# Patient Record
Sex: Male | Born: 1940 | Race: White | Hispanic: No | Marital: Married | Smoking: Never smoker
Health system: Southern US, Community
[De-identification: ages and names within clinical notes are randomized; demographics above are authoritative.]

## PROBLEM LIST (undated history)

## (undated) DIAGNOSIS — E039 Hypothyroidism, unspecified: Secondary | ICD-10-CM

## (undated) DIAGNOSIS — M1990 Unspecified osteoarthritis, unspecified site: Secondary | ICD-10-CM

## (undated) DIAGNOSIS — D126 Benign neoplasm of colon, unspecified: Secondary | ICD-10-CM

## (undated) DIAGNOSIS — J849 Interstitial pulmonary disease, unspecified: Secondary | ICD-10-CM

## (undated) DIAGNOSIS — Z85828 Personal history of other malignant neoplasm of skin: Secondary | ICD-10-CM

## (undated) DIAGNOSIS — F411 Generalized anxiety disorder: Secondary | ICD-10-CM

## (undated) DIAGNOSIS — Z8774 Personal history of (corrected) congenital malformations of heart and circulatory system: Secondary | ICD-10-CM

## (undated) DIAGNOSIS — N138 Other obstructive and reflux uropathy: Secondary | ICD-10-CM

## (undated) DIAGNOSIS — M722 Plantar fascial fibromatosis: Secondary | ICD-10-CM

## (undated) DIAGNOSIS — G4700 Insomnia, unspecified: Secondary | ICD-10-CM

## (undated) DIAGNOSIS — Z5181 Encounter for therapeutic drug level monitoring: Secondary | ICD-10-CM

## (undated) DIAGNOSIS — I780 Hereditary hemorrhagic telangiectasia: Secondary | ICD-10-CM

## (undated) DIAGNOSIS — E7800 Pure hypercholesterolemia, unspecified: Secondary | ICD-10-CM

## (undated) DIAGNOSIS — M545 Low back pain, unspecified: Secondary | ICD-10-CM

## (undated) DIAGNOSIS — K589 Irritable bowel syndrome without diarrhea: Secondary | ICD-10-CM

## (undated) DIAGNOSIS — D044 Carcinoma in situ of skin of scalp and neck: Secondary | ICD-10-CM

## (undated) DIAGNOSIS — K219 Gastro-esophageal reflux disease without esophagitis: Secondary | ICD-10-CM

## (undated) DIAGNOSIS — N401 Enlarged prostate with lower urinary tract symptoms: Secondary | ICD-10-CM

## (undated) DIAGNOSIS — J3489 Other specified disorders of nose and nasal sinuses: Secondary | ICD-10-CM

## (undated) HISTORY — PX: OTHER SURGICAL HISTORY: SHX169

## (undated) HISTORY — DX: Insomnia, unspecified: G47.00

## (undated) HISTORY — PX: NASAL SINUS SURGERY: SHX719

## (undated) HISTORY — DX: Benign prostatic hyperplasia with lower urinary tract symptoms: N40.1

## (undated) HISTORY — DX: Benign neoplasm of colon, unspecified: D12.6

## (undated) HISTORY — DX: Generalized anxiety disorder: F41.1

## (undated) HISTORY — DX: Other specified disorders of nose and nasal sinuses: J34.89

## (undated) HISTORY — DX: Irritable bowel syndrome, unspecified: K58.9

## (undated) HISTORY — DX: Plantar fascial fibromatosis: M72.2

## (undated) HISTORY — DX: Low back pain: M54.5

## (undated) HISTORY — DX: Low back pain, unspecified: M54.50

## (undated) HISTORY — DX: Other obstructive and reflux uropathy: N13.8

## (undated) HISTORY — PX: PROSTATE SURGERY: SHX751

## (undated) HISTORY — DX: Hypothyroidism, unspecified: E03.9

## (undated) HISTORY — DX: Pure hypercholesterolemia, unspecified: E78.00

## (undated) HISTORY — PX: BRAIN SURGERY: SHX531

## (undated) HISTORY — DX: Encounter for therapeutic drug level monitoring: Z51.81

## (undated) HISTORY — PX: TRANSTHORACIC ECHOCARDIOGRAM: SHX275

---

## 1989-12-20 HISTORY — PX: VARICOCELECTOMY: SHX1084

## 1999-05-08 ENCOUNTER — Encounter: Payer: Self-pay | Admitting: Urology

## 1999-05-08 ENCOUNTER — Ambulatory Visit (HOSPITAL_COMMUNITY): Admission: RE | Admit: 1999-05-08 | Discharge: 1999-05-08 | Payer: Self-pay | Admitting: Urology

## 1999-07-21 ENCOUNTER — Emergency Department (HOSPITAL_COMMUNITY): Admission: EM | Admit: 1999-07-21 | Discharge: 1999-07-21 | Payer: Self-pay | Admitting: Emergency Medicine

## 2000-05-05 ENCOUNTER — Ambulatory Visit (HOSPITAL_COMMUNITY): Admission: RE | Admit: 2000-05-05 | Discharge: 2000-05-05 | Payer: Self-pay | Admitting: Pulmonary Disease

## 2000-05-05 ENCOUNTER — Encounter: Payer: Self-pay | Admitting: Pulmonary Disease

## 2001-02-23 ENCOUNTER — Emergency Department (HOSPITAL_COMMUNITY): Admission: EM | Admit: 2001-02-23 | Discharge: 2001-02-23 | Payer: Self-pay | Admitting: Emergency Medicine

## 2001-03-02 ENCOUNTER — Emergency Department (HOSPITAL_COMMUNITY): Admission: EM | Admit: 2001-03-02 | Discharge: 2001-03-02 | Payer: Self-pay | Admitting: Emergency Medicine

## 2001-08-29 ENCOUNTER — Encounter: Payer: Self-pay | Admitting: Pulmonary Disease

## 2001-08-29 ENCOUNTER — Ambulatory Visit (HOSPITAL_COMMUNITY): Admission: RE | Admit: 2001-08-29 | Discharge: 2001-08-29 | Payer: Self-pay | Admitting: Pulmonary Disease

## 2003-01-03 ENCOUNTER — Inpatient Hospital Stay (HOSPITAL_COMMUNITY): Admission: EM | Admit: 2003-01-03 | Discharge: 2003-01-04 | Payer: Self-pay | Admitting: Emergency Medicine

## 2003-01-03 ENCOUNTER — Encounter: Payer: Self-pay | Admitting: Cardiology

## 2003-01-03 ENCOUNTER — Encounter: Payer: Self-pay | Admitting: Emergency Medicine

## 2003-01-04 ENCOUNTER — Encounter: Payer: Self-pay | Admitting: Cardiology

## 2003-01-14 HISTORY — PX: CARDIOVASCULAR STRESS TEST: SHX262

## 2005-06-01 ENCOUNTER — Ambulatory Visit: Payer: Self-pay | Admitting: Pulmonary Disease

## 2005-08-27 ENCOUNTER — Ambulatory Visit: Payer: Self-pay | Admitting: Pulmonary Disease

## 2005-12-06 ENCOUNTER — Ambulatory Visit (HOSPITAL_COMMUNITY): Admission: RE | Admit: 2005-12-06 | Discharge: 2005-12-06 | Payer: Self-pay | Admitting: Pulmonary Disease

## 2005-12-06 ENCOUNTER — Ambulatory Visit: Payer: Self-pay | Admitting: Pulmonary Disease

## 2006-05-27 ENCOUNTER — Ambulatory Visit: Payer: Self-pay | Admitting: Pulmonary Disease

## 2006-10-04 ENCOUNTER — Ambulatory Visit: Payer: Self-pay | Admitting: Pulmonary Disease

## 2007-01-30 ENCOUNTER — Ambulatory Visit: Payer: Self-pay | Admitting: Pulmonary Disease

## 2007-03-20 ENCOUNTER — Ambulatory Visit: Payer: Self-pay | Admitting: Pulmonary Disease

## 2007-06-15 ENCOUNTER — Ambulatory Visit: Payer: Self-pay | Admitting: Pulmonary Disease

## 2007-06-15 LAB — CONVERTED CEMR LAB
ALT: 24 units/L (ref 0–53)
AST: 23 units/L (ref 0–37)
Albumin: 4 g/dL (ref 3.5–5.2)
Alkaline Phosphatase: 66 units/L (ref 39–117)
BUN: 16 mg/dL (ref 6–23)
Basophils Absolute: 0 10*3/uL (ref 0.0–0.1)
Basophils Relative: 0.5 % (ref 0.0–1.0)
Bilirubin Urine: NEGATIVE
Bilirubin, Direct: 0.1 mg/dL (ref 0.0–0.3)
CO2: 33 meq/L — ABNORMAL HIGH (ref 19–32)
Calcium: 9.7 mg/dL (ref 8.4–10.5)
Chloride: 107 meq/L (ref 96–112)
Cholesterol: 208 mg/dL (ref 0–200)
Creatinine, Ser: 1.1 mg/dL (ref 0.4–1.5)
Direct LDL: 139.4 mg/dL
Eosinophils Absolute: 0.1 10*3/uL (ref 0.0–0.6)
Eosinophils Relative: 1.4 % (ref 0.0–5.0)
GFR calc Af Amer: 87 mL/min
GFR calc non Af Amer: 72 mL/min
Glucose, Bld: 96 mg/dL (ref 70–99)
HCT: 49.7 % (ref 39.0–52.0)
HDL: 33.8 mg/dL — ABNORMAL LOW (ref >39.0)
Hemoglobin, Urine: NEGATIVE
Hemoglobin: 16.6 g/dL (ref 13.0–17.0)
Ketones, ur: NEGATIVE mg/dL
Leukocytes, UA: NEGATIVE
Lymphocytes Relative: 42.2 % (ref 12.0–46.0)
MCHC: 33.3 g/dL (ref 30.0–36.0)
MCV: 93 fL (ref 78.0–100.0)
Monocytes Absolute: 0.6 10*3/uL (ref 0.2–0.7)
Monocytes Relative: 9.5 % (ref 3.0–11.0)
Neutro Abs: 3.1 10*3/uL (ref 1.4–7.7)
Neutrophils Relative %: 46.4 % (ref 43.0–77.0)
Nitrite: NEGATIVE
PSA: 1.19 ng/mL (ref 0.10–4.00)
Platelets: 191 10*3/uL (ref 150–400)
Potassium: 5 meq/L (ref 3.5–5.1)
RBC: 5.35 M/uL (ref 4.22–5.81)
RDW: 13.1 % (ref 11.5–14.6)
Sodium: 143 meq/L (ref 135–145)
Specific Gravity, Urine: 1.025 (ref 1.000–1.03)
TSH: 0.88 microintl units/mL (ref 0.35–5.50)
Total Bilirubin: 1.1 mg/dL (ref 0.3–1.2)
Total CHOL/HDL Ratio: 6.2
Total Protein, Urine: NEGATIVE mg/dL
Total Protein: 7 g/dL (ref 6.0–8.3)
Triglycerides: 75 mg/dL (ref 0–149)
Urine Glucose: NEGATIVE mg/dL
Urobilinogen, UA: 0.2 (ref 0.0–1.0)
VLDL: 15 mg/dL (ref 0–40)
WBC: 6.6 10*3/uL (ref 4.5–10.5)
pH: 6 (ref 5.0–8.0)

## 2007-09-28 ENCOUNTER — Ambulatory Visit: Payer: Self-pay | Admitting: Pulmonary Disease

## 2008-02-08 DIAGNOSIS — M545 Low back pain, unspecified: Secondary | ICD-10-CM | POA: Insufficient documentation

## 2008-02-08 DIAGNOSIS — K589 Irritable bowel syndrome without diarrhea: Secondary | ICD-10-CM | POA: Insufficient documentation

## 2008-02-08 DIAGNOSIS — D126 Benign neoplasm of colon, unspecified: Secondary | ICD-10-CM

## 2008-02-08 DIAGNOSIS — E039 Hypothyroidism, unspecified: Secondary | ICD-10-CM | POA: Insufficient documentation

## 2008-02-09 ENCOUNTER — Ambulatory Visit: Payer: Self-pay | Admitting: Pulmonary Disease

## 2008-02-09 DIAGNOSIS — I780 Hereditary hemorrhagic telangiectasia: Secondary | ICD-10-CM | POA: Insufficient documentation

## 2008-02-10 DIAGNOSIS — G4709 Other insomnia: Secondary | ICD-10-CM | POA: Insufficient documentation

## 2008-02-10 DIAGNOSIS — N401 Enlarged prostate with lower urinary tract symptoms: Secondary | ICD-10-CM

## 2008-02-10 DIAGNOSIS — J40 Bronchitis, not specified as acute or chronic: Secondary | ICD-10-CM | POA: Insufficient documentation

## 2008-02-10 DIAGNOSIS — N138 Other obstructive and reflux uropathy: Secondary | ICD-10-CM | POA: Insufficient documentation

## 2008-02-10 DIAGNOSIS — G4700 Insomnia, unspecified: Secondary | ICD-10-CM | POA: Insufficient documentation

## 2008-02-10 DIAGNOSIS — C4490 Unspecified malignant neoplasm of skin, unspecified: Secondary | ICD-10-CM

## 2008-02-10 DIAGNOSIS — K219 Gastro-esophageal reflux disease without esophagitis: Secondary | ICD-10-CM

## 2008-02-10 DIAGNOSIS — J3489 Other specified disorders of nose and nasal sinuses: Secondary | ICD-10-CM

## 2008-02-10 LAB — CONVERTED CEMR LAB
ALT: 21 units/L (ref 0–53)
AST: 18 units/L (ref 0–37)
Albumin: 4.2 g/dL (ref 3.5–5.2)
Alkaline Phosphatase: 60 units/L (ref 39–117)
BUN: 19 mg/dL (ref 6–23)
Basophils Absolute: 0 10*3/uL (ref 0.0–0.1)
Basophils Relative: 0 % (ref 0.0–1.0)
Bilirubin, Direct: 0.2 mg/dL (ref 0.0–0.3)
CO2: 32 meq/L (ref 19–32)
Calcium: 9.3 mg/dL (ref 8.4–10.5)
Chloride: 107 meq/L (ref 96–112)
Cholesterol: 172 mg/dL (ref 0–200)
Creatinine, Ser: 1 mg/dL (ref 0.4–1.5)
Eosinophils Absolute: 0.2 10*3/uL (ref 0.0–0.6)
Eosinophils Relative: 2.2 % (ref 0.0–5.0)
GFR calc Af Amer: 96 mL/min
GFR calc non Af Amer: 80 mL/min
Glucose, Bld: 97 mg/dL (ref 70–99)
HCT: 51.1 % (ref 39.0–52.0)
HDL: 29.7 mg/dL — ABNORMAL LOW (ref >39.0)
Hemoglobin: 17 g/dL (ref 13.0–17.0)
LDL Cholesterol: 129 mg/dL — ABNORMAL HIGH (ref 0–99)
Lymphocytes Relative: 39.3 % (ref 12.0–46.0)
MCHC: 33.4 g/dL (ref 30.0–36.0)
MCV: 92 fL (ref 78.0–100.0)
Monocytes Absolute: 0.8 10*3/uL — ABNORMAL HIGH (ref 0.2–0.7)
Monocytes Relative: 9.3 % (ref 3.0–11.0)
Neutro Abs: 4.3 10*3/uL (ref 1.4–7.7)
Neutrophils Relative %: 49.2 % (ref 43.0–77.0)
PSA: 0.76 ng/mL (ref 0.10–4.00)
Platelets: 214 10*3/uL (ref 150–400)
Potassium: 4.9 meq/L (ref 3.5–5.1)
RBC: 5.55 M/uL (ref 4.22–5.81)
RDW: 12.3 % (ref 11.5–14.6)
Sodium: 142 meq/L (ref 135–145)
TSH: 0.69 microintl units/mL (ref 0.35–5.50)
Total Bilirubin: 1 mg/dL (ref 0.3–1.2)
Total CHOL/HDL Ratio: 5.8
Total Protein: 7.1 g/dL (ref 6.0–8.3)
Triglycerides: 69 mg/dL (ref 0–149)
VLDL: 14 mg/dL (ref 0–40)
WBC: 8.7 10*3/uL (ref 4.5–10.5)

## 2008-02-16 ENCOUNTER — Telehealth (INDEPENDENT_AMBULATORY_CARE_PROVIDER_SITE_OTHER): Payer: Self-pay | Admitting: *Deleted

## 2008-02-23 ENCOUNTER — Encounter: Payer: Self-pay | Admitting: Pulmonary Disease

## 2008-07-25 ENCOUNTER — Ambulatory Visit: Payer: Self-pay | Admitting: Pulmonary Disease

## 2008-07-25 DIAGNOSIS — E7800 Pure hypercholesterolemia, unspecified: Secondary | ICD-10-CM

## 2008-07-26 ENCOUNTER — Ambulatory Visit: Payer: Self-pay | Admitting: Pulmonary Disease

## 2008-07-28 LAB — CONVERTED CEMR LAB
ALT: 23 units/L (ref 0–53)
AST: 21 units/L (ref 0–37)
Albumin: 3.9 g/dL (ref 3.5–5.2)
Alkaline Phosphatase: 62 units/L (ref 39–117)
BUN: 18 mg/dL (ref 6–23)
Basophils Absolute: 0 10*3/uL (ref 0.0–0.1)
Basophils Relative: 0.7 % (ref 0.0–3.0)
Bilirubin, Direct: 0.2 mg/dL (ref 0.0–0.3)
CO2: 28 meq/L (ref 19–32)
Calcium: 8.7 mg/dL (ref 8.4–10.5)
Chloride: 111 meq/L (ref 96–112)
Cholesterol: 91 mg/dL (ref 0–200)
Creatinine, Ser: 1.1 mg/dL (ref 0.4–1.5)
Eosinophils Absolute: 0.2 10*3/uL (ref 0.0–0.7)
Eosinophils Relative: 2.2 % (ref 0.0–5.0)
GFR calc Af Amer: 86 mL/min
GFR calc non Af Amer: 71 mL/min
Glucose, Bld: 105 mg/dL — ABNORMAL HIGH (ref 70–99)
HCT: 47.1 % (ref 39.0–52.0)
HDL: 27.5 mg/dL — ABNORMAL LOW (ref >39.0)
Hemoglobin: 16.5 g/dL (ref 13.0–17.0)
LDL Cholesterol: 54 mg/dL (ref 0–99)
Lymphocytes Relative: 44.6 % (ref 12.0–46.0)
MCHC: 34.9 g/dL (ref 30.0–36.0)
MCV: 93.1 fL (ref 78.0–100.0)
Monocytes Absolute: 0.6 10*3/uL (ref 0.1–1.0)
Monocytes Relative: 9.1 % (ref 3.0–12.0)
Neutro Abs: 3 10*3/uL (ref 1.4–7.7)
Neutrophils Relative %: 43.4 % (ref 43.0–77.0)
Platelets: 175 10*3/uL (ref 150–400)
Potassium: 4.1 meq/L (ref 3.5–5.1)
RBC: 5.06 M/uL (ref 4.22–5.81)
RDW: 12.6 % (ref 11.5–14.6)
Sodium: 142 meq/L (ref 135–145)
TSH: 0.44 microintl units/mL (ref 0.35–5.50)
Total Bilirubin: 1 mg/dL (ref 0.3–1.2)
Total CHOL/HDL Ratio: 3.3
Total Protein: 6.5 g/dL (ref 6.0–8.3)
Triglycerides: 49 mg/dL (ref 0–149)
VLDL: 10 mg/dL (ref 0–40)
WBC: 7 10*3/uL (ref 4.5–10.5)

## 2008-08-06 ENCOUNTER — Telehealth (INDEPENDENT_AMBULATORY_CARE_PROVIDER_SITE_OTHER): Payer: Self-pay | Admitting: *Deleted

## 2008-09-11 ENCOUNTER — Ambulatory Visit: Payer: Self-pay | Admitting: Pulmonary Disease

## 2008-09-13 ENCOUNTER — Encounter: Payer: Self-pay | Admitting: Adult Health

## 2008-09-13 ENCOUNTER — Ambulatory Visit: Payer: Self-pay | Admitting: Internal Medicine

## 2008-09-13 DIAGNOSIS — R079 Chest pain, unspecified: Secondary | ICD-10-CM

## 2008-09-16 ENCOUNTER — Emergency Department (HOSPITAL_COMMUNITY): Admission: EM | Admit: 2008-09-16 | Discharge: 2008-09-16 | Payer: Self-pay | Admitting: Emergency Medicine

## 2008-09-17 ENCOUNTER — Telehealth: Payer: Self-pay | Admitting: Pulmonary Disease

## 2008-09-17 ENCOUNTER — Ambulatory Visit: Payer: Self-pay | Admitting: Pulmonary Disease

## 2008-09-17 DIAGNOSIS — R42 Dizziness and giddiness: Secondary | ICD-10-CM | POA: Insufficient documentation

## 2008-09-19 ENCOUNTER — Ambulatory Visit: Payer: Self-pay | Admitting: Internal Medicine

## 2008-10-04 ENCOUNTER — Ambulatory Visit: Payer: Self-pay | Admitting: Cardiology

## 2008-10-17 ENCOUNTER — Encounter: Payer: Self-pay | Admitting: Cardiology

## 2008-10-17 ENCOUNTER — Encounter: Payer: Self-pay | Admitting: Pulmonary Disease

## 2008-10-17 ENCOUNTER — Ambulatory Visit: Payer: Self-pay

## 2008-11-21 ENCOUNTER — Ambulatory Visit: Payer: Self-pay | Admitting: Cardiology

## 2008-12-02 ENCOUNTER — Ambulatory Visit: Payer: Self-pay | Admitting: Pulmonary Disease

## 2009-01-30 ENCOUNTER — Ambulatory Visit: Payer: Self-pay | Admitting: Pulmonary Disease

## 2009-01-30 DIAGNOSIS — M722 Plantar fascial fibromatosis: Secondary | ICD-10-CM

## 2009-01-30 LAB — CONVERTED CEMR LAB
ALT: 24 units/L (ref 0–53)
AST: 24 units/L (ref 0–37)
Albumin: 4.1 g/dL (ref 3.5–5.2)
Alkaline Phosphatase: 59 units/L (ref 39–117)
BUN: 19 mg/dL (ref 6–23)
Basophils Absolute: 0.1 10*3/uL (ref 0.0–0.1)
Basophils Relative: 0.7 % (ref 0.0–3.0)
Bilirubin, Direct: 0.2 mg/dL (ref 0.0–0.3)
CO2: 29 meq/L (ref 19–32)
Calcium: 9.3 mg/dL (ref 8.4–10.5)
Chloride: 104 meq/L (ref 96–112)
Cholesterol: 108 mg/dL (ref 0–200)
Creatinine, Ser: 1 mg/dL (ref 0.4–1.5)
Eosinophils Absolute: 0.2 10*3/uL (ref 0.0–0.7)
Eosinophils Relative: 1.8 % (ref 0.0–5.0)
Ferritin: 28.5 ng/mL (ref 22.0–322.0)
GFR calc Af Amer: 96 mL/min
GFR calc non Af Amer: 79 mL/min
Glucose, Bld: 97 mg/dL (ref 70–99)
HCT: 47.5 % (ref 39.0–52.0)
HDL: 30.7 mg/dL — ABNORMAL LOW (ref >39.0)
Hemoglobin: 16.4 g/dL (ref 13.0–17.0)
Iron: 126 ug/dL (ref 42–165)
LDL Cholesterol: 66 mg/dL (ref 0–99)
Lymphocytes Relative: 38.6 % (ref 12.0–46.0)
MCHC: 34.6 g/dL (ref 30.0–36.0)
MCV: 91.9 fL (ref 78.0–100.0)
Monocytes Absolute: 0.8 10*3/uL (ref 0.1–1.0)
Monocytes Relative: 9.2 % (ref 3.0–12.0)
Neutro Abs: 4.2 10*3/uL (ref 1.4–7.7)
Neutrophils Relative %: 49.7 % (ref 43.0–77.0)
PSA: 0.9 ng/mL (ref 0.10–4.00)
Platelets: 171 10*3/uL (ref 150–400)
Potassium: 4.8 meq/L (ref 3.5–5.1)
RBC: 5.17 M/uL (ref 4.22–5.81)
RDW: 12.9 % (ref 11.5–14.6)
Saturation Ratios: 32.6 % (ref 20.0–50.0)
Sodium: 141 meq/L (ref 135–145)
TSH: 0.68 microintl units/mL (ref 0.35–5.50)
Total Bilirubin: 1 mg/dL (ref 0.3–1.2)
Total Protein: 7 g/dL (ref 6.0–8.3)
Transferrin: 276.3 mg/dL (ref >=212.0)
Triglycerides: 56 mg/dL (ref 0–149)
VLDL: 11 mg/dL (ref 0–40)
WBC: 8.7 10*3/uL (ref 4.5–10.5)

## 2009-04-10 ENCOUNTER — Telehealth (INDEPENDENT_AMBULATORY_CARE_PROVIDER_SITE_OTHER): Payer: Self-pay | Admitting: *Deleted

## 2009-04-10 DIAGNOSIS — M6740 Ganglion, unspecified site: Secondary | ICD-10-CM | POA: Insufficient documentation

## 2009-04-16 ENCOUNTER — Encounter: Payer: Self-pay | Admitting: Pulmonary Disease

## 2009-05-30 ENCOUNTER — Ambulatory Visit (HOSPITAL_BASED_OUTPATIENT_CLINIC_OR_DEPARTMENT_OTHER): Admission: RE | Admit: 2009-05-30 | Discharge: 2009-05-30 | Payer: Self-pay | Admitting: Orthopedic Surgery

## 2009-05-30 HISTORY — PX: OTHER SURGICAL HISTORY: SHX169

## 2009-07-07 ENCOUNTER — Encounter: Payer: Self-pay | Admitting: Pulmonary Disease

## 2009-08-21 ENCOUNTER — Ambulatory Visit: Payer: Self-pay | Admitting: Adult Health

## 2009-08-21 DIAGNOSIS — J019 Acute sinusitis, unspecified: Secondary | ICD-10-CM

## 2009-09-11 ENCOUNTER — Ambulatory Visit: Payer: Self-pay | Admitting: Pulmonary Disease

## 2009-09-11 DIAGNOSIS — F411 Generalized anxiety disorder: Secondary | ICD-10-CM

## 2009-09-11 LAB — CONVERTED CEMR LAB
BUN: 17 mg/dL (ref 6–23)
Basophils Absolute: 0 10*3/uL (ref 0.0–0.1)
Basophils Relative: 0.2 % (ref 0.0–3.0)
CO2: 31 meq/L (ref 19–32)
Calcium: 9.6 mg/dL (ref 8.4–10.5)
Chloride: 107 meq/L (ref 96–112)
Creatinine, Ser: 1.2 mg/dL (ref 0.4–1.5)
Eosinophils Absolute: 0.1 10*3/uL (ref 0.0–0.7)
Eosinophils Relative: 1.4 % (ref 0.0–5.0)
GFR calc non Af Amer: 64.21 mL/min (ref >60)
Glucose, Bld: 94 mg/dL (ref 70–99)
HCT: 50.6 % (ref 39.0–52.0)
Hemoglobin: 17.3 g/dL — ABNORMAL HIGH (ref 13.0–17.0)
Iron: 166 ug/dL — ABNORMAL HIGH (ref 42–165)
Lymphocytes Relative: 43.1 % (ref 12.0–46.0)
Lymphs Abs: 4 10*3/uL (ref 0.7–4.0)
MCHC: 34.3 g/dL (ref 30.0–36.0)
MCV: 92.8 fL (ref 78.0–100.0)
Monocytes Absolute: 0.9 10*3/uL (ref 0.1–1.0)
Monocytes Relative: 9.6 % (ref 3.0–12.0)
Neutro Abs: 4.2 10*3/uL (ref 1.4–7.7)
Neutrophils Relative %: 45.7 % (ref 43.0–77.0)
Platelets: 191 10*3/uL (ref 150.0–400.0)
Potassium: 4.9 meq/L (ref 3.5–5.1)
RBC: 5.46 M/uL (ref 4.22–5.81)
RDW: 12.4 % (ref 11.5–14.6)
Saturation Ratios: 41.2 % (ref 20.0–50.0)
Sed Rate: 3 mm/hr (ref 0–22)
Sodium: 143 meq/L (ref 135–145)
Transferrin: 287.9 mg/dL (ref 212.0–360.0)
WBC: 9.2 10*3/uL (ref 4.5–10.5)

## 2009-09-12 ENCOUNTER — Encounter: Payer: Self-pay | Admitting: Pulmonary Disease

## 2009-09-29 ENCOUNTER — Telehealth (INDEPENDENT_AMBULATORY_CARE_PROVIDER_SITE_OTHER): Payer: Self-pay | Admitting: *Deleted

## 2009-12-25 ENCOUNTER — Encounter: Payer: Self-pay | Admitting: Pulmonary Disease

## 2010-03-12 ENCOUNTER — Ambulatory Visit: Payer: Self-pay | Admitting: Pulmonary Disease

## 2010-03-15 LAB — CONVERTED CEMR LAB
LDL Cholesterol: 54 mg/dL (ref 0–99)
TSH: 0.52 microintl units/mL (ref 0.35–5.50)
VLDL: 11.8 mg/dL (ref 0.0–40.0)

## 2010-06-04 ENCOUNTER — Ambulatory Visit: Payer: Self-pay | Admitting: Pulmonary Disease

## 2010-06-05 LAB — CONVERTED CEMR LAB
AST: 21 units/L (ref 0–37)
Albumin: 4.2 g/dL (ref 3.5–5.2)
Alkaline Phosphatase: 70 units/L (ref 39–117)
Basophils Absolute: 0 10*3/uL (ref 0.0–0.1)
Basophils Relative: 0.4 % (ref 0.0–3.0)
Bilirubin, Direct: 0.1 mg/dL (ref 0.0–0.3)
Calcium: 9.3 mg/dL (ref 8.4–10.5)
GFR calc non Af Amer: 61.12 mL/min (ref >60)
Hemoglobin: 15.9 g/dL (ref 13.0–17.0)
Lymphocytes Relative: 36.9 % (ref 12.0–46.0)
Monocytes Relative: 10.7 % (ref 3.0–12.0)
Neutro Abs: 4.5 10*3/uL (ref 1.4–7.7)
Neutrophils Relative %: 50.2 % (ref 43.0–77.0)
RBC: 4.96 M/uL (ref 4.22–5.81)
RDW: 13.7 % (ref 11.5–14.6)
Sodium: 147 meq/L — ABNORMAL HIGH (ref 135–145)

## 2010-06-09 DIAGNOSIS — S139XXA Sprain of joints and ligaments of unspecified parts of neck, initial encounter: Secondary | ICD-10-CM | POA: Insufficient documentation

## 2010-06-29 ENCOUNTER — Ambulatory Visit: Payer: Self-pay | Admitting: Pulmonary Disease

## 2010-07-01 ENCOUNTER — Telehealth: Payer: Self-pay | Admitting: Pulmonary Disease

## 2010-09-11 ENCOUNTER — Telehealth (INDEPENDENT_AMBULATORY_CARE_PROVIDER_SITE_OTHER): Payer: Self-pay | Admitting: *Deleted

## 2010-09-22 ENCOUNTER — Ambulatory Visit: Payer: Self-pay | Admitting: Pulmonary Disease

## 2010-12-09 ENCOUNTER — Encounter: Payer: Self-pay | Admitting: Pulmonary Disease

## 2011-01-19 NOTE — Progress Notes (Signed)
Summary: question re appt   Phone Note Call from Patient   Caller: Patient [phone] Reason for Call: Talk to Nurse Summary of Call: pt calling to see when he is/was due to come back to see crenshaw Initial call taken by: Glynda Jaeger,  September 11, 2010 10:26 AM  Follow-up for Phone Call        LAST NOTE STATES AS NEEDED IF NO PROBLEMS NO APPT  NEEDED IF HAS SYMPTOMS  THEN SCHEDULE FIRST AVAILABLE Follow-up by: Scherrie Bateman, LPN,  September 11, 2010 10:41 AM  Additional Follow-up for Phone Call Additional follow up Details #1::        called pt - he will call back to schedule if any problems arise-doint very well now Glynda Jaeger  September 11, 2010 11:09 AM

## 2011-01-19 NOTE — Progress Notes (Signed)
Summary: rx  Phone Note Call from Patient Call back at [phone]   Caller: Patient Reason for Call: Talk to Nurse [facility] [address] - Please call in Protonix  - discussed at visit yesterday, but she never called it in. Initial call taken by: Eugene Gavia,  July 01, 2010 1:16 PM  Follow-up for Phone Call        protonix has been sent to the pharmacy..Called patient and left message on machine to make him aware of rx for protonix sent to his pharmacy Randell Loop [facility]  July 01, 2010 1:47 PM     Prescriptions: PROTONIX 40 MG  TBEC (PANTOPRAZOLE SODIUM) Take 1 tablet by mouth once a day as needed  #30 x 5   Entered by:   Randell Loop CMA   Authorized by:   Michele Mcalpine MD   Signed by:   Randell Loop CMA on 07/01/2010   Method used:   Electronically to        [facility]  [phone]* (retail)       [address] [place], [place]  [address]       Ph: [phone] or [phone]       Fax: [phone]   RxID:   [id number]

## 2011-01-19 NOTE — Assessment & Plan Note (Signed)
Summary: NP follow up - dizziness/cervical strain   CC:  3 week follow up - states symptoms have completely resolved.  would like a refill on protonix.  History of Present Illness: 70 y/o WM here  with known history of Osler-Weber-Rendu Dz and hereditary telangiectasia, GERD. IBS   10/09-- add-on visit due to dizziness... sudden episode yest working at [employer] around [employer] w/ room spinning- lasted and resolved spontaneously, felt light headed afterwards... no LOC, syncope, seizure activity, etc... no CP/ palpit/ SOB/ etc... there were several EMTs in the restaurant and they checked him out- stable and transported to ER... exam was neg- and told to see me right away... 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 he cannot breath out of the left side of his nose- this side is occluded secondary to prev surg and skin grafts... he is followed by Gayla Medicus who has told him there is nothing that anybody can do for that side of his nose...  ~  Aug09- some difficulty w/ pain in his feet secondary to plantar fasciitis eval & Rx from [facility] w/ Celebrex & shots- not much better so far... he may have to quit work and really retire!  ~  Sep09 saw TParrett w/ several recent bouts of CP- while working in yard, washing car, etc... resolved w/ rest & Protonix... EKG showed WNL, prev [facility] was neg- without ischemia or infarction and EF= 61%... he is set-up to see Cardiology on 10/16...  December 02, 2008 --Complains of 1 week of cough, congestion.-thick -unable to get up. Started on medrol dose pack - not much better. (on day 4).  August 21, 2009--Presents for an acute office visit. Complains of sinus pressure/congestion with no drianage, dry cough x4days.  Take otc with no help. Leaving for beach in couple of days. .Nose  very stopped up cant breath out of. cheek pain w/ bending, teeth hurt.      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 he notes some leg pains, plantar fasciitis, & insomnia waking 3-4 times nightly (Ambien helps)... medically stable- occas nose bleeds, chest stable, due for FLP in the [place], requests refills for 2011...  June 04, 2010--Presents for a work in visit today. Over last 3 weeks has had intermittent lightheadness. 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 He has been using meclizine which is helping. He has been under alot of stress has possible trip to [place] that is coming up and concerned about this. Has not associated visual/speech changes, ext weakness. Worse w/ turning head, bending over. 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 this seems to aggravate his dizziness. Also over last week woke up stiffness along right side of neck, sore if he turns to the side. Feels like he has a "crick" in his neck. He is on mobic daily, no other meds used. Denies chest pain, dyspnea, orthopnea, hemoptysis, fever, n/v/d, edema, headache,recent travel , radicular symptoms , new meds, ext. weakness.    June 29, 2010----Presents for follow up. He was seen last visit w/ vertigo and cervical strain. Tx w/  mobic and skelaxin  and meclizine. He is  feeling much better. Went on vacation. Also canceled trip to [place]. Feels stress and computer work were contributing to his symptoms. Labs were essentially unremarkable except for Na+ was sl up at 147. He was recommended to increase fluid intake. Denies chest pain, dyspnea, orthopnea, hemoptysis, fever, n/v/d, edema, headache, dizziness.     Medications Prior to Update: 1)  Claritin-D 12 Hour 5-120 Mg  Tb12 (Loratadine-Pseudoephedrine) .... As Needed 2)  Simvastatin 40 Mg Tabs (Simvastatin) .... Take 1 Tab By  Mouth At Bedtime.Marland KitchenMarland Kitchen 3)  Levothroid 125 Mcg  Tabs (Levothyroxine Sodium) .... Take 1 Tablet By Mouth Once A Day 4)  Protonix 40 Mg  Tbec (Pantoprazole Sodium) .... Take 1 Tablet By Mouth Once A Day As Needed 5)  Zantac 75 75 Mg Tabs (Ranitidine Hcl) .... Take 1 Tablet By Mouth Once A Day 6)  Librax 2.5-5 Mg  Caps (Clidinium-Chlordiazepoxide) .... Take 1 Tab By Mouth Three Times A Day As Needed Abd Cramping... 7)  Rapaflo 8 Mg Caps (Silodosin) .... Take 1 Tablet By Mouth Once A Day 8)  Clorazepate Dipotassium 7.5 Mg  Tabs (Clorazepate Dipotassium) .... Take 1 Tablet By Mouth Up To Three Times Daily As Needed For Anxiety.Marland KitchenMarland Kitchen 9)  Ambien 10 Mg  Tabs (Zolpidem Tartrate) .[name].. 1 By Mouth At Bedtime As Needed For Sleep... 10)  Centrum   Tabs (Multiple Vitamins-Minerals) .... Take 1 Tablet By Mouth Once A Day 11)  Meclizine Hcl 25 Mg Tabs (Meclizine Hcl) .... Take 1/2 To 1 Tab By Mouth 4 Times Daily As Directed For Dizziness... 12)  Meloxicam 7.5 Mg Tabs (Meloxicam) .... Take 1 Tab By Mouth Once Daily As Needed For Arthritis Pain... 13)  Skelaxin 800 Mg Tabs (Metaxalone) .[name].. 1 By Mouth Three Times A Day As Needed Muscle Spasm  Current Medications (verified): 1)  Claritin-D 12 Hour 5-120 Mg  Tb12 (Loratadine-Pseudoephedrine) .... As Needed 2)  Simvastatin 40 Mg Tabs (Simvastatin) .... Take 1 Tab By Mouth At Bedtime.Marland KitchenMarland Kitchen 3)  Levothroid 125 Mcg  Tabs (Levothyroxine Sodium) .... Take 1 Tablet By Mouth Once A Day 4)  Protonix 40 Mg  Tbec (Pantoprazole Sodium) .... Take 1 Tablet By Mouth Once A Day As Needed 5)  Zantac 75 75 Mg Tabs (Ranitidine Hcl) .... Take 1 Tablet By Mouth Once A Day 6)  Librax 2.5-5 Mg  Caps (Clidinium-Chlordiazepoxide) .... Take 1 Tab By Mouth Three Times A Day As Needed Abd Cramping... 7)  Rapaflo 8 Mg Caps (Silodosin) .... Take 1 Tablet By Mouth Once A Day 8)  Clorazepate Dipotassium 7.5 Mg  Tabs (Clorazepate Dipotassium) .... Take 1 Tablet By Mouth Up To Three Times Daily As Needed For  Anxiety.Marland KitchenMarland Kitchen 9)  Ambien 10 Mg  Tabs (Zolpidem Tartrate) .[name].. 1 By Mouth At Bedtime As Needed For Sleep... 10)  Centrum   Tabs (Multiple Vitamins-Minerals) .... Take 1 Tablet By Mouth Once A Day 11)  Meclizine Hcl 25 Mg Tabs (Meclizine Hcl) .... Take 1/2 To 1 Tab By Mouth 4 Times Daily As Directed For Dizziness... 12)  Meloxicam 7.5 Mg Tabs (Meloxicam) .... Take 1 Tab By Mouth Once Daily As Needed For Arthritis Pain... 13)  Skelaxin 800 Mg Tabs (Metaxalone) .[name].. 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) Hx of BRONCHITIS (ICD-490) OSLER-WEBER-RENDU DISEASE (ICD-448.0) CHEST PAIN (ICD-786.50) HYPERCHOLESTEROLEMIA, MILD (ICD-272.0) HYPOTHYROIDISM (ICD-244.9) GERD (ICD-530.81)  IRRITABLE BOWEL SYNDROME (ICD-564.1) COLONIC POLYPS (ICD-211.3) HYPERTROPHY PROSTATE W/UR OBST & OTH LUTS (ICD-600.01) BACK PAIN, LUMBAR (ICD-724.2) PLANTAR FASCIITIS (ICD-728.71) DIZZINESS (ICD-780.4) ANXIETY (ICD-300.00) INSOMNIA, CHRONIC (ICD-307.42) Hx of CARCINOMA, SKIN, 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)  Past Surgical History: Last updated: 03/12/2010 S/P left vaaricocele surgery in 1991 by DrRDavis S/P mult sinus & nasal surgery for 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 S/P pulse laser Rx for facial telangiectasias in the past  Family History: Last updated: 09/13/2008 mother alive age 88- DM  father deceased age 59 from renal failure 1 silbing alive age 75 from HBP neg for heart dz  Social History: Last updated: 09/17/2008 married 2 children non smoker no caffeine no etoh  Risk Factors: Smoking Status: never (06/04/2010)  Review of Systems      See HPI  Vital Signs:  Patient profile:   70 year old male Height:      70 inches Weight:      190.13 pounds BMI:     27.38 O2 Sat:      93 % on Room air Temp:      96.6 degrees F oral Pulse rate:   59 / minute BP sitting:   98 / 66  (left arm) Cuff size:   regular  Vitals Entered By: Boone Master CNA/MA (June 29, 2010 3:37 PM)  O2 Flow:  Room air CC: 3 week follow up - states symptoms have completely resolved.  would like a refill on protonix Is Patient Diabetic? No Comments Medications reviewed with patient Daytime contact number verified with patient. Boone Master CNA/MA  June 29, 2010 3:37 PM    Physical Exam  Additional Exam:  WD, WN, 70 y/o WM in NAD... GENERAL:  Alert & oriented; pleasant & cooperative... HEENT:  [place]/AT, EOM-wnl, PERRLA, EACs-clear, TMs-wnl, NOSE- occluded left nares from skin grafts, non tender sinus . THROAT- mucosal telangiectasias seen... similiar telangiectsis on lips etc... NECK:  Supple w/ fairROM; no JVD; normal carotid impulses w/o bruits; no thyromegaly or nodules palpated; no lymphadenopathy, neg nuchal rigidity.  CHEST:  Clear to P & A; without wheezes/ rales/ or rhonchi. HEART:  Regular Rhythm; without murmurs/ rubs/ or gallops. ABDOMEN:  Soft & nontender; normal bowel sounds; no organomegaly or masses detected. EXT: without deformities, mild arthritic changes; no varicose veins/ venous insuffic/ or edema. NEURO:  CN's intact; motor testing normal; sensory testing normal; gait normal & balance OK., neg rhomberg, neg head manuevrs, neg nystagmus , equal hand grips/strength.   DERM:  mult telangiectasias...     Impression & Recommendations:  Problem # 1:  CERVICAL STRAIN (ICD-847.0)  resolved  advised on stretches and exercise.  correct posture and rest breaks from computer.   Orders: Est. Patient Level II (16109)  Problem # 2:  DIZZINESS (ICD-780.4)  improved w/ meclizine increase fluids  labs reviewed.   Orders: Est. Patient Level II (60454)  Complete Medication List: 1)  Claritin-d 12 Hour 5-120 Mg Tb12 (Loratadine-pseudoephedrine) .... As needed 2)  Simvastatin 40 Mg Tabs  (Simvastatin) .... Take 1 tab by mouth at bedtime.Marland KitchenMarland Kitchen 3)  Levothroid 125 Mcg Tabs (Levothyroxine sodium) .... Take 1 tablet by mouth once a day 4)  Protonix 40 Mg Tbec (Pantoprazole sodium) .... Take 1 tablet by mouth once a day as needed 5)  Zantac 75 75 Mg Tabs (Ranitidine hcl) .... Take 1 tablet by mouth once a day 6)  Librax 2.5-5 Mg Caps (Clidinium-chlordiazepoxide) .... Take 1 tab by mouth three times a day as needed abd cramping... 7)  Rapaflo 8 Mg Caps (Silodosin) .... Take 1 tablet by mouth once a day 8)  Clorazepate Dipotassium 7.5 Mg Tabs (Clorazepate dipotassium) .... Take 1 tablet by mouth up to three times daily as needed for anxiety.Marland KitchenMarland Kitchen 9)  Ambien 10 Mg Tabs (Zolpidem tartrate) .[name].. 1 by mouth at bedtime as needed for sleep... 10)  Centrum Tabs (Multiple vitamins-minerals) .... Take 1 tablet by mouth once a day 11)  Meclizine Hcl 25 Mg Tabs (Meclizine hcl) .... Take 1/2 to 1 tab by mouth 4 times daily as directed for dizziness... 12)  Meloxicam 7.5 Mg Tabs (Meloxicam) .... Take 1 tab by mouth once daily as needed for arthritis pain... 13)  Skelaxin 800 Mg Tabs (Metaxalone) .[name].. 1 by mouth three times a day as needed muscle spasm  Patient Instructions: 1)  Increase fluids , change positions slowly  2)  Warm heat to neck and back as needed  3)   Skelaxin 800mg  1 by mouth three times a day as needed muscle spasm.  4)  Please contact office for sooner follow up if symptoms do not improve or worsen  5)  follow up Dr. Kriste Basque as scheduled and as needed

## 2011-01-19 NOTE — Letter (Signed)
Summary: [facility]   Imported By: Sherian Rein 12/31/2009 14:10:03  _____________________________________________________________________  External Attachment:    Type:   Image     Comment:   External Document

## 2011-01-19 NOTE — Assessment & Plan Note (Signed)
Summary: rov 6 months///kp   CC:  6 month [name] & review of mult medical problems....  History of Present Illness: 70 y/o WM here for a follow up visit... he has multiple medical problems as noted below...     ~  Feb10:  states he is doing reasonably well- still working at [employer] per day on his feet w/ c/o legs aching/ sore/ tired... he prev indicated that he would decr his hours & perhaps retire, but he hasn't cut his hours yet... he has plantar fascitis & sees podiatry for this... he wonders if the Crestor10 could be contributing... we discussed plan to hold the Crestor for 1 month, then consider decreased hours for 1 month to see if these are contributing factors... denies exertional leg discomfort, no RLS/ nocturnal symptoms, etc...  ~  Sep10:  he's had a quiet 83mo- only c/o pain in hands & feet... eval by DrSypher & [facility]- on Celebrex which he alternates w/ Tylenol/ Advil... asking about change to generic Mobic for $$$ reasons, as well as changing the Crestor to Simvastatin... 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 he notes some leg pains, plantar fasciitis, & insomnia waking 3-4 times nightly (Ambien helps)... medically stable- occas nose bleeds, chest stable, due for FLP in the Simva40, requests refills for 2011...   ~  September 22, 2010:  he has several complaints today- musc cramps, dry throat & cough (mostly due to chr nasal prob & mouth breathing), dizzy spells (mostly when bending over w/ valsalva)... we discussed humidification, saline mist, MucinexDM, & physiology of the valsalva to avoid this proble... he denies interval bleeding, CP, SOB, etc... he would like the Flu shot, Rx for Shingles vaccine, & refill of meds.   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 he's had numerous nose bleeds and surgery by Mikael Spray, now followed by Gayla Medicus... left nares is occluded w/ skin graft- no lumen... this is quite uncomfortable to the patient but he's been told there is nothing further that can be done for this...  ~  3/11:  notes occas nosebleeds but he is able to handle them...  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 known GI telangiectasia as well without signif GI bleeding in the past- followed by [facility]... 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 extensive nasal problems as above...  ~  labs 2/09 showed Hg= 17.0  ~  labs 8/09 showed Hg= 16.5  ~  labs 2/10 showed Hg= 16.4  ~  labs 9/10 showed Hg= 17.3  ~  CXR 3/11 is chr incr markings esp RLL- no change, NAD...   Hx of CHEST PAIN (ICD-786.50) - seen Sep09 w/ several recent bouts of CP while working in yard, washing car, etc... resolved w/ rest & Protonix... EKG showed WNL, prev [facility]Test 1/04 at [facility] was neg (no ischemia or infarction and EF= 61%)... Cardiac eval DrCrenshaw w/ 2DEcho 10/09 showing norm LV- no regional wall motion abn, norm LVF w/ EF= 60%, mild dil RA/ RV;  and norm MYOVIEW (no ischemia or infarction, EF= 58%)...  HYPERCHOLESTEROLEMIA, MILD (ICD-272.0) - now on SIMVASTATIN 40mg /d...  ~  FLP 6/08  showed TChol 208, TG 75, HDL 34, LDL 139... he preferred diet Rx-  ~  FLP 2/09 showed TChol 172, TG 69, HDL 30, LDL 129... he agreed to Crestor10...  ~  FLP 8/09 on Crestor10 showed TChol 91, TG 49, HDL 28, LDL 54... rec- keep same.  ~  FLP 2/10 on Crestor10 showed TChol 108, TG 56, HDL 31, LDL 66  ~  9/10:  we discussed changing Crestor10 to Simvastatin40 for $$$ reasons...  ~  FLP 3/11 on Simva40 showed TChol 105, TG 59, HDL 39, LDL 54... continue same.  HYPOTHYROIDISM - on SYNTHROID 177mcg/d and stable...  ~   labs 2/09 showed TSH= 0.69...  8/09 showed TSH= 0.44...  ~  labs 2/10 showed TSH= 0.68  ~  labs 3/11 showed TSH= 0.52  GERD, IRRITABLE BOWEL SYNDROME, COLONIC POLYPS - followed by [facility]... prev on Protonix, he changed to OTC Zantac 75mg  on his own... also takes  LIBRAX Prn... colonoscopy 1/04 w/ divertics, diminutive rectal polyp (adenomatous), hems... f/u colon 1/07 showed no recurrent polyps...  HYPERTROPHY PROSTATE W/UR OBST & OTH LUTS (ICD-600.01) - followed by [id number] on RAPAFLO 8mg /d (stopped Flomax due to side effect- ED)... doing well without LTOS, just complains of nocturia x 1-2  ~  labs 2/09 showed PSA= 0.76  ~  labs 2/10 showed PSA= 0.90  ~  2011 PSA checked by DrDavis (pt states it was OK)...  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  ~  9/10:  requesting Rx for Robaxin for muscle spasm as needed.  PLANTAR FASCIITIS (ICD-728.71) - c/o pain in his feet secondary to plantar fasciitis eval & Rx from [facility] w/ Celebrex & shots...  ~  9/10: we discussed change to [facility] - symptoms improved.  Hx of DIZZINESS (ICD-780.4) - sudden episode dizziness Sep09 working at [employer] around [employer] w/ room spinning- lasted and resolved spontaneously, felt light headed afterwards... no LOC, syncope, seizure activity, etc... no CP/ palpit/ SOB/ etc... there were several EMTs in the restaurant and they checked him out- stable and transported to ER... exam was neg-  they did labs: all norm, didn't do scans (can't get MRI due to metallic clamp in sinus), he can't take ASA due to bleeding from his OWR.[name]. treated w/ MECLIZINE w/ improvement.  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  Health Maintenance:  ~  GI:  followed by [facility] & up to date on colon etc... (last  1/07 & 73yr f/u due 1/12).  ~  GU:  followed by DrRDavis, and he did his PSA this yr.  ~  Immunizations:  he getsyearly Flu vaccinations in the fall;  OK Pneumovax 03/12/10 at age[age];     Preventive Screening-Counseling & Management  Alcohol-Tobacco     Smoking Status: never  Current Medications (verified): 1)  Claritin-D 12 Hour 5-120 Mg  Tb12 (Loratadine-Pseudoephedrine) .... As Needed 2)  Simvastatin 40 Mg Tabs (Simvastatin) .... Take 1 Tab By Mouth At Bedtime.Marland KitchenMarland Kitchen 3)  Levothroid 125 Mcg  Tabs (Levothyroxine Sodium) .... Take 1 Tablet By Mouth Once A Day 4)  Protonix 40 Mg  Tbec (Pantoprazole Sodium) .... Take 1 Tablet By Mouth Once A Day As Needed 5)  Zantac 75 75 Mg Tabs (Ranitidine Hcl) .... Take 1 Tablet By Mouth Once A Day 6)  Librax  2.5-5 Mg  Caps (Clidinium-Chlordiazepoxide) .... Take 1 Tab By Mouth Three Times A Day As Needed Abd Cramping... 7)  Rapaflo 8 Mg Caps (Silodosin) .... Take 1 Tablet By Mouth Once A Day 8)  Clorazepate Dipotassium 7.5 Mg  Tabs (Clorazepate Dipotassium) .... Take 1 Tablet By Mouth Up To Three Times Daily As Needed For Anxiety.Marland KitchenMarland Kitchen 9)  Ambien 10 Mg  Tabs (Zolpidem Tartrate) .[name].. 1 By Mouth At Bedtime As Needed For Sleep... 10)  Centrum   Tabs (Multiple Vitamins-Minerals) .... Take 1 Tablet By Mouth Once A Day 11)  Meclizine Hcl 25 Mg Tabs (Meclizine Hcl) .... Take 1/2 To 1 Tab By Mouth 4 Times Daily As Directed For Dizziness... 12)  Meloxicam 7.5 Mg Tabs (Meloxicam) .... Take 1 Tab By Mouth Once Daily As Needed For Arthritis Pain... 13)  Skelaxin 800 Mg Tabs (Metaxalone) .[name].. 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 mother alive age 22- DM  father deceased age 52 from renal failure 1 silbing alive age 17 from HBP neg for heart dz  Social History: Reviewed history from 09/17/2008 and no changes required. married 2 children non smoker no caffeine no etoh  Review of Systems      See HPI  The patient denies anorexia, fever, weight loss, weight gain, vision loss, decreased hearing, hoarseness, chest pain, syncope, dyspnea on exertion, peripheral edema, prolonged cough, headaches, hemoptysis, abdominal pain, melena, hematochezia, severe indigestion/heartburn, hematuria, incontinence, muscle weakness, suspicious skin lesions, transient blindness, difficulty walking, depression, unusual weight change, abnormal bleeding, enlarged lymph nodes, and angioedema.    Vital Signs:  Patient profile:   70 year old male Height:      70 inches Weight:      193.6 pounds BMI:     27.88 O2 Sat:      92 % on Room air Temp:     97.7 degrees F oral Pulse rate:   72 / minute BP sitting:   124 / 64  (left arm)  Vitals Entered By: Renold Genta RCP, LPN (September 22, 2010 1:53 PM)  O2 Flow:  Room air CC: 6 month [name] & review of mult medical problems... Comments Medications reviewed with patient Renold Genta RCP, LPN  September 22, 2010 2:01 PM    Physical Exam  Additional Exam:  WD, WN, 70 y/o WM in NAD... GENERAL:  Alert &  oriented; pleasant & cooperative... HEENT:  [place]/AT, EOM-wnl, PERRLA, EACs-clear, TMs-wnl, NOSE- occluded left nares from skin grafts, MOUTH- mucosal telangiectasias.  NECK:  Supple w/ fairROM; no JVD; normal carotid impulses w/o bruits; no thyromegaly or nodules palpated; no lymphadenopathy. CHEST:  Clear to P & A; without wheezes/ rales/ or rhonchi. HEART:  Regular Rhythm; without murmurs/ rubs/ or gallops. ABDOMEN:  Soft & nontender;  normal bowel sounds; no organomegaly or masses detected. EXT: without deformities, mild arthritic changes; no varicose veins/ venous insuffic/ or edema. NEURO:  CN's intact; motor testing normal; sensory testing normal; gait normal & balance OK. DERM:  mult telangiectasias...    Impression & Recommendations:  Problem # 1:  OTHER DISEASES OF NASAL CAVITY AND SINUSES (ICD-478.19) Nasal obstruction from surg, skin graft, etc... use humidifier Qhs & saline spray during the day.  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 recent Hg- 15.  Problem # 3:  HYPERCHOLESTEROLEMIA, MILD (ICD-272.0) Stable on the Simva40. His updated medication list for this problem includes:    Simvastatin 40 Mg Tabs (Simvastatin) .[name]... Take 1 tab by mouth at bedtime...  Problem # 4:  HYPOTHYROIDISM (ICD-244.9) Stable on the [id number]. His updated medication list for this problem includes:    Levothroid 125 Mcg Tabs (Levothyroxine sodium) .[name]... Take 1 tablet by mouth once a day  Problem # 5:  GERD (ICD-530.81) GI is stable on his current regimen... His updated medication list for this problem includes:    Protonix 40 Mg Tbec (Pantoprazole sodium) .[name]... Take 1 tablet by mouth once a day as needed    Zantac 75 75 Mg Tabs (Ranitidine hcl) .[name]... Take 1 tablet by mouth once a day    Librax 2.5-5 Mg Caps (Clidinium-chlordiazepoxide) .[name]... Take 1 tab by mouth three times a day as needed abd cramping...  Problem # 6:   HYPERTROPHY PROSTATE W/UR OBST & OTH LUTS (ICD-600.01) Stable on the Rapaflo from Urology... His updated medication list for this problem includes:    Rapaflo 8 Mg Caps (Silodosin) .[name]... Take 1 tablet by mouth once a day  Problem # 7:  DIZZINESS (ICD-780.4) We discussed this indetail & renewed his Meclizine Rx... His updated medication list for this problem includes:    Meclizine Hcl 25 Mg Tabs (Meclizine hcl) .[name]... Take 1/2 to 1 tab by mouth 4 times daily as directed for dizziness...  Problem # 8:  OTHER MEDICAL PROBLEMS AS NOTED>>>  Complete Medication List: 1)  Claritin-d 12 Hour 5-120 Mg Tb12 (Loratadine-pseudoephedrine) .... As needed 2)  Simvastatin 40 Mg Tabs (Simvastatin) .... Take 1 tab by mouth at bedtime.Marland KitchenMarland Kitchen 3)  Levothroid 125 Mcg Tabs (Levothyroxine sodium) .... Take 1 tablet by mouth once a day 4)  Protonix 40 Mg Tbec (Pantoprazole sodium) .... Take 1 tablet by mouth once a day as needed 5)  Zantac 75 75 Mg Tabs (Ranitidine hcl) .... Take 1 tablet by mouth once a day 6)  Librax 2.5-5 Mg Caps (Clidinium-chlordiazepoxide) .... Take 1 tab by mouth three times a day as needed abd cramping... 7)  Rapaflo 8 Mg Caps (Silodosin) .... Take 1 tablet by mouth once a day 8)  Clorazepate Dipotassium 7.5 Mg Tabs (Clorazepate dipotassium) .... Take 1 tablet by mouth up to three times daily as needed for anxiety.Marland KitchenMarland Kitchen 9)  Ambien 10 Mg Tabs (Zolpidem tartrate) .[name].. 1 by mouth at bedtime as needed for sleep... 10)  Centrum Tabs (Multiple vitamins-minerals) .... Take 1 tablet by mouth once a day 11)  Meclizine Hcl 25 Mg Tabs (Meclizine hcl) .... Take 1/2 to 1 tab by mouth 4 times daily as directed for dizziness... 12)  Meloxicam 7.5 Mg Tabs (Meloxicam) .... Take 1 tab by mouth once daily as needed for arthritis pain... 13)  Skelaxin 800 Mg Tabs (Metaxalone) .[name].. 1 by mouth three times a day as needed muscle spasm 14)  Shingles Vaccine  .Marland KitchenMarland KitchenMarland Kitchen  Administer shingles vaccine please...  Other Orders: Flu  Vaccine 69yrs + MEDICARE PATIENTS (Z6109) Administration Flu vaccine - MCR (U0454)  Patient Instructions: 1)  Today we updated your med list- see below.... 2)  We refilled your meds as requested... 3)  Remember to use the Mucinex DM as needed... 4)  We gave you the 2011 FLu vaccine today... 5)  We wrote a perscription for the SHINGLES vaccine as well... 6)  Call for any problems.Marland KitchenMarland Kitchen 7)  Please schedule a follow-up appointment in 6 months, & we will plan CXR, & FASTING blood work at that time... Prescriptions: SHINGLES VACCINE Administer Shingles vaccine please...  #1 x 0   Entered and Authorized by:   Michele Mcalpine MD   Signed by:   Michele Mcalpine MD on 09/22/2010   Method used:   Print then Give to Patient   RxID:   [id number] MELOXICAM 7.5 MG TABS (MELOXICAM) take 1 tab by mouth once daily as needed for arthritis pain...  #30 x 12   Entered and Authorized by:   Michele Mcalpine MD   Signed by:   Michele Mcalpine MD on 09/22/2010   Method used:   Print then Give to Patient   RxID:   [id number] MECLIZINE HCL 25 MG TABS (MECLIZINE HCL) take 1/2 to 1 tab by mouth 4 times daily as directed for dizziness...  #100 x 6   Entered and Authorized by:   Michele Mcalpine MD   Signed by:   Michele Mcalpine MD on 09/22/2010   Method used:   Print then Give to Patient   RxID:   [id number] AMBIEN 10 MG  TABS (ZOLPIDEM TARTRATE) 1 by mouth at bedtime as needed for sleep...  #30 x 6   Entered and Authorized by:   Michele Mcalpine MD   Signed by:   Michele Mcalpine MD on 09/22/2010   Method used:   Print then Give to Patient   RxID:   [id number] CLORAZEPATE DIPOTASSIUM 7.5 MG  TABS (CLORAZEPATE DIPOTASSIUM) Take 1 tablet by mouth up to three times daily as needed for anxiety...  #100 x 6   Entered and Authorized by:   Michele Mcalpine MD   Signed by:   Michele Mcalpine MD on 09/22/2010   Method used:   Print then Give to Patient   RxID:   [id number] RAPAFLO 8 MG CAPS (SILODOSIN) Take 1 tablet  by mouth once a day  #30 x 12   Entered and Authorized by:   Michele Mcalpine MD   Signed by:   Michele Mcalpine MD on 09/22/2010   Method used:   Print then Give to Patient   RxID:   [id number] LIBRAX 2.5-5 MG  CAPS (CLIDINIUM-CHLORDIAZEPOXIDE) take 1 tab by mouth three times a day as needed abd cramping...  #100 x 6   Entered and Authorized by:   Michele Mcalpine MD   Signed by:   Michele Mcalpine MD on 09/22/2010   Method used:   Print then Give to Patient   RxID:   [id number] PROTONIX 40 MG  TBEC (PANTOPRAZOLE SODIUM) Take 1 tablet by mouth once a day as needed  #30 x 12   Entered and Authorized by:   Michele Mcalpine MD   Signed by:   Michele Mcalpine MD on 09/22/2010   Method used:   Print then Give to Patient   RxID:   [id number] LEVOTHROID 125 MCG  TABS (  LEVOTHYROXINE SODIUM) Take 1 tablet by mouth once a day  #30 x 12   Entered and Authorized by:   Michele Mcalpine MD   Signed by:   Michele Mcalpine MD on 09/22/2010   Method used:   Print then Give to Patient   RxID:   [id number] SIMVASTATIN 40 MG TABS (SIMVASTATIN) take 1 tab by mouth at bedtime...  #30 x 12   Entered and Authorized by:   Michele Mcalpine MD   Signed by:   Michele Mcalpine MD on 09/22/2010   Method used:   Print then Give to Patient   RxID:   [id number]         Flu Vaccine Consent Questions     Do you have a history of severe allergic reactions to this vaccine? no    Any prior history of allergic reactions to egg and/or gelatin? no    Do you have a sensitivity to the preservative Thimersol? no    Do you have a past history of Guillan-Barre Syndrome? no    Do you currently have an acute febrile illness? no    Have you ever had a severe reaction to latex? no    Vaccine information given and explained to patient? yes    Are you currently pregnant? no    Lot Number:[id number]   Exp Date:06/19/2011   Site Given  Left Deltoid IMdflu [facility], LPN  September 22, 2010 3:04 PM

## 2011-01-19 NOTE — Assessment & Plan Note (Signed)
Summary: 6 months/apc   CC:  6 month [name] & review of mult medical problems....  History of Present Illness: 70 y/o WM here for a follow up visit... he has multiple medical problems as noted below...     ~  Feb10:  states he is doing reasonably well- still working at [employer] per day on his feet w/ c/o legs aching/ sore/ tired... he prev indicated that he would decr his hours & perhaps retire, but he hasn't cut his hours yet... he has plantar fascitis & sees podiatry for this... he wonders if the Crestor10 could be contributing... we discussed plan to hold the Crestor for 1 month, then consider decreased hours for 1 month to see if these are contributing factors... denies exertional leg discomfort, no RLS/ nocturnal symptoms, etc...  ~  Sep10:  he's had a quiet 73mo- only c/o pain in hands & feet... eval by DrSypher & [facility]- on Celebrex which he alternates w/ Tylenol/ Advil... asking about change to generic Mobic for $$$ reasons, as well as changing the Crestor to Simvastatin... 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 he notes some leg pains, plantar fasciitis, & insomnia waking 3-4 times nightly (Ambien helps)... medically stable- occas nose bleeds, chest stable, due for FLP in the Simva40, requests refills for 2011...    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 he's had numerous nose bleeds and surgery by Mikael Spray, now followed by Gayla Medicus... left nares is occluded w/ skin graft- no lumen... this is quite uncomfortable to the patient but he's been told there is nothing further that can be done for this...  ~  3/11:  notes occas nosebleeds but he is able to handle them...  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 known GI telangiectasia as well without signif GI bleeding in the past- followed by [facility]... 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 extensive nasal problems as above...  ~  labs 2/09 showed Hg= 17.0  ~  labs 8/09 showed Hg= 16.5  ~  labs 2/10 showed Hg= 16.4  ~  labs 9/10 showed Hg= 17.3  ~  CXR 3/11 is chr incr markings esp RLL- no change, NAD...   Hx of CHEST PAIN (ICD-786.50) - seen Sep09 w/ several recent bouts of CP while working in yard, washing car, etc... resolved w/ rest & Protonix... EKG showed WNL, prev [facility]Test 1/04 at [facility] was neg (no ischemia or infarction and EF= 61%)... Cardiac eval DrCrenshaw w/ 2DEcho 10/09 showing norm LV- no regional wall motion abn, norm LVF w/ EF= 60%, mild dil RA/ RV;  and norm MYOVIEW (no ischemia or infarction, EF= 58%)...  HYPERCHOLESTEROLEMIA, MILD (ICD-272.0) - now on SIMVASTATIN 40mg /d...  ~  FLP 6/08 showed TChol 208, TG 75, HDL 34, LDL 139... he preferred diet Rx-  ~  FLP 2/09 showed TChol 172, TG 69, HDL 30, LDL 129... he agreed to Crestor10...  ~  FLP 8/09 on Crestor10 showed TChol 91, TG 49, HDL 28, LDL 54... rec- keep same.  ~  FLP 2/10 on Crestor10 showed TChol 108, TG 56, HDL 31, LDL 66  ~  9/10:  we discussed  changing Crestor10 to Simvastatin40 for $$$ reasons...  ~  FLP 3/11 on Simva40 showed TChol 105, TG 59, HDL 39, LDL 54... continue same.  HYPOTHYROIDISM - on SYNTHROID 127mcg/d and stable...  ~  labs 2/09 showed TSH= 0.69...  8/09 showed TSH= 0.44...  ~  labs 2/10 showed TSH= 0.68  ~  labs 3/11 showed TSH= 0.52  GERD, IRRITABLE BOWEL SYNDROME, COLONIC POLYPS - followed by [facility]... prev on Protonix, he changed to OTC Zantac 75mg  on his own... also takes  LIBRAX Prn... colonoscopy 1/04 w/ divertics, diminutive rectal polyp (adenomatous), hems... f/u colon 1/07 showed no recurrent polyps...  HYPERTROPHY  PROSTATE W/UR OBST & OTH LUTS (ICD-600.01) - followed by [id number] on RAPAFLO 8mg /d (stopped Flomax due to side effect- ED)... doing well without LTOS, just complains of nocturia x 1-2  ~  labs 2/09 showed PSA= 0.76  ~  labs 2/10 showed PSA= 0.90  ~  2011 PSA checked by DrDavis (pt states it was OK)...  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  ~  9/10:  requesting Rx for Robaxin for muscle spasm as needed.  PLANTAR FASCIITIS (ICD-728.71) - c/o pain in his feet secondary to plantar fasciitis eval & Rx from [facility] w/ Celebrex & shots...  ~  9/10: we discussed change to [facility] - symptoms improved.  Hx of DIZZINESS (ICD-780.4) - sudden episode dizziness Sep09 working at [employer] around [employer] w/ room spinning- lasted and resolved spontaneously, felt light headed afterwards... no LOC, syncope, seizure activity, etc... no CP/ palpit/ SOB/ etc... there were several EMTs in the restaurant and they checked him out- stable and transported to ER... exam was neg-  they did labs: all norm, didn't do scans (can't get MRI due to metallic clamp in sinus), he can't take ASA due to bleeding from his OWR.[name]. treated w/ MECLIZINE w/ improvement.  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  Health Maintenance:  ~  GI:  followed by [facility] & up to date on colon etc... (last 1/07 & 18yr f/u due 1/12).  ~  GU:  followed by DrRDavis, and he did his PSA this yr.  ~  Immunizations:  he getsyearly Flu vaccinations in the fall;  OK Pneumovax 03/12/10 at age62 25;     Allergies (verified): No Known Drug Allergies  Comments:  Nurse/Medical Assistant: The patient's medications and allergies were reviewed with the patient and were updated in the Medication and Allergy Lists.  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) CHEST PAIN (ICD-786.50) HYPERCHOLESTEROLEMIA, MILD (ICD-272.0) HYPOTHYROIDISM (ICD-244.9) GERD (ICD-530.81) IRRITABLE BOWEL SYNDROME (ICD-564.1) COLONIC POLYPS (ICD-211.3) HYPERTROPHY PROSTATE W/UR OBST & OTH LUTS (ICD-600.01) BACK PAIN, LUMBAR (ICD-724.2) PLANTAR FASCIITIS (ICD-728.71) DIZZINESS (ICD-780.4) ANXIETY (ICD-300.00) INSOMNIA, CHRONIC (ICD-307.42) Hx of CARCINOMA, SKIN, 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)  Past Surgical History: S/P left vaaricocele surgery in 1991 by DrRDavis S/P mult sinus & nasal surgery for 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 S/P pulse laser Rx for facial telangiectasias in the past  Family History: Reviewed history from 09/13/2008 and no changes required. mother alive age 37- DM  father deceased age 75 from renal failure 1 silbing alive age 71 from HBP neg for heart dz  Social History: Reviewed history from 09/17/2008  and no changes required. married 2 children non smoker no caffeine no etoh  Review of Systems      See HPI  The patient denies anorexia, fever, weight loss, weight gain, vision loss, decreased hearing, hoarseness, chest pain, syncope, dyspnea on exertion, peripheral edema, prolonged cough, headaches, hemoptysis, abdominal pain, melena, hematochezia, severe indigestion/heartburn, hematuria, incontinence, muscle weakness, suspicious skin lesions, transient blindness, difficulty walking, depression, unusual weight change, abnormal bleeding, enlarged lymph nodes, and angioedema.    Vital Signs:  Patient profile:   70 year old male Height:      70 inches Weight:      186.50 pounds BMI:     26.86 O2 Sat:      90 % on Room air Temp:     95.7 degrees F oral Pulse rate:   66 / minute BP sitting:   108 / 72  (left arm) Cuff size:   regular  Vitals Entered By: Randell Loop CMA (March 12, 2010 9:57 AM)  O2 Sat at Rest %:  90 O2 Flow:  Room air CC: 6 month [name] & review of mult medical problems... Is Patient Diabetic? No Pain Assessment Patient in pain? no      Comments meds updated today   Physical Exam  Additional Exam:  WD, WN, 70 y/o WM in NAD... GENERAL:  Alert & oriented; pleasant & cooperative... HEENT:  [place]/AT, EOM-wnl, PERRLA, EACs-clear, TMs-wnl, NOSE- occluded left nares from skin grafts, MOUTH- mucosal telangiectasias.  NECK:  Supple w/ fairROM; no JVD; normal carotid impulses w/o bruits; no thyromegaly or nodules palpated; no lymphadenopathy. CHEST:  Clear to P & A; without wheezes/ rales/ or rhonchi. HEART:  Regular Rhythm; without murmurs/ rubs/ or gallops. ABDOMEN:  Soft & nontender; normal bowel sounds; no organomegaly or masses detected. EXT: without deformities, mild arthritic changes; no varicose veins/ venous insuffic/ or edema. NEURO:  CN's intact; motor testing normal; sensory testing normal; gait normal & balance OK. DERM:  mult telangiectasias...    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 hx RLL AVM w/o signif hypoxemia, polycythemia, etc... mult GI tract telangiectasis w/o major GI bleeds etc...  Problem # 2:  CHEST PAIN (ICD-786.50) No further CP's etc... he is active & doing well... Orders: T-2 View CXR (71020TC)  Problem # 3:  HYPERCHOLESTEROLEMIA, MILD (ICD-272.0) FLP looks great on the Simva40-  continue same. His updated medication list for this problem includes:    Simvastatin 40 Mg Tabs (Simvastatin) .[name]... Take 1 tab by mouth at bedtime...  Orders: TLB-Lipid Panel (80061-LIPID) TLB-TSH (Thyroid Stimulating Hormone) (84443-TSH)  Problem # 4:  HYPOTHYROIDISM (ICD-244.9) Thyroid stable on meds... His updated medication list for this problem includes:    Levothroid  125 Mcg Tabs (Levothyroxine sodium) .[name]... Take 1 tablet by mouth once a day  Problem # 5:  HYPERTROPHY PROSTATE W/UR OBST & OTH LUTS (ICD-600.01) GU per DrDavis, and he did MrJohnson's PSA this yr... His updated medication list for this problem includes:    Rapaflo 8 Mg Caps (Silodosin) .[name]... 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 His updated medication list for this problem includes:    Meloxicam 7.5 Mg Tabs (Meloxicam) .[name]... Take 1 tab by mouth once daily as needed for arthritis pain...  Problem # 7:  ANXIETY (ICD-300.00) Anxiety &  chr persistant insomnia Rx w/ Tranxene & Ambien... he is content to continue as is... His updated medication list for this problem includes:    Clorazepate Dipotassium 7.5 Mg Tabs (Clorazepate dipotassium) .[name]... Take 1 tablet by mouth up to three times daily as needed for anxiety...  Problem # 8:  OTHER MEDICAL PROBLEMS AS NOTED>>> OK for Pneumovax today- age68.  Complete Medication List: 1)  Claritin-d 12 Hour 5-120 Mg Tb12 (Loratadine-pseudoephedrine) .... As needed 2)  Simvastatin 40 Mg Tabs (Simvastatin) .... Take 1 tab by mouth at bedtime.Marland KitchenMarland Kitchen 3)  Levothroid 125 Mcg Tabs (Levothyroxine sodium) .... Take 1 tablet by mouth once a day 4)  Protonix 40 Mg Tbec (Pantoprazole sodium) .... Take 1 tablet by mouth once a day as needed 5)  Zantac 75 75 Mg Tabs (Ranitidine hcl) .... Take 1 tablet by mouth once a day 6)  Librax 2.5-5 Mg Caps (Clidinium-chlordiazepoxide) .... Take 1 tab by mouth three times a day as needed abd cramping... 7)  Rapaflo 8 Mg Caps (Silodosin) .... Take 1 tablet by mouth once a day 8)  Clorazepate Dipotassium 7.5 Mg Tabs (Clorazepate dipotassium) .... Take 1 tablet by mouth up to three times daily as needed for anxiety.Marland KitchenMarland Kitchen 9)  Ambien 10 Mg Tabs (Zolpidem tartrate) .[name].. 1 by mouth at bedtime as needed for sleep... 10)  Centrum Tabs (Multiple  vitamins-minerals) .... Take 1 tablet by mouth once a day 11)  Meclizine Hcl 25 Mg Tabs (Meclizine hcl) .... Take 1/2 to 1 tab by mouth 4 times daily as directed for dizziness... 12)  Meloxicam 7.5 Mg Tabs (Meloxicam) .... Take 1 tab by mouth once daily as needed for arthritis pain...  Other Orders: Prescription Created Electronically [phone]) Pneumococcal Vaccine (51884) Admin 1st Vaccine (16606)  Patient Instructions: 1)  Today we updated your med list- see below.... 2)  We refilled your meds per request... 3)  Today we did your f/u FLP on the Simva40... please call the "phone tree" in a few days for your lab results.Marland KitchenMarland Kitchen 4)  We also gave you the PNEUMONIA vaccine- according to the current recommendations from the CDC- this is the only Pneumovax you will need...  5)  Call for any problems.Marland KitchenMarland Kitchen 6)  Please schedule a follow-up appointment in 6 months. Prescriptions: MECLIZINE HCL 25 MG TABS (MECLIZINE HCL) take 1/2 to 1 tab by mouth 4 times daily as directed for dizziness...  #100 x prn   Entered and Authorized by:   Michele Mcalpine MD   Signed by:   Michele Mcalpine MD on 03/12/2010   Method used:   Print then Give to Patient   RxID:   [id number] AMBIEN 10 MG  TABS (ZOLPIDEM TARTRATE) 1 by mouth at bedtime as needed for sleep...  #30 x prn   Entered and Authorized by:   Michele Mcalpine MD   Signed by:   Michele Mcalpine MD on 03/12/2010   Method used:   Print then Give to Patient   RxID:   [id number] LIBRAX 2.5-5 MG  CAPS (CLIDINIUM-CHLORDIAZEPOXIDE) take 1 tab by mouth three times a day as needed abd cramping...  #100 x prn   Entered and Authorized by:   Michele Mcalpine MD   Signed by:   Michele Mcalpine MD on 03/12/2010   Method used:   Print then Give to Patient   RxID:   [id number] CLORAZEPATE DIPOTASSIUM 7.5 MG  TABS (CLORAZEPATE DIPOTASSIUM) Take 1 tablet by mouth up to three times daily as needed  for anxiety...  #100 x prn   Entered and Authorized by:   Michele Mcalpine MD    Signed by:   Michele Mcalpine MD on 03/12/2010   Method used:   Print then Give to Patient   RxID:   [id number] LEVOTHROID 125 MCG  TABS (LEVOTHYROXINE SODIUM) Take 1 tablet by mouth once a day  #30 x prn   Entered and Authorized by:   Michele Mcalpine MD   Signed by:   Michele Mcalpine MD on 03/12/2010   Method used:   Print then Give to Patient   RxID:   [id number]    Immunizations Administered:  Pneumonia Vaccine:    Vaccine Type: Pneumovax    Site: left deltoid    Mfr: Merck    Dose: 0.5 ml    Route: IM    Given by: Randell Loop CMA    Exp. Date: 08/03/2011    Lot #: [id number]    VIS given: 07/17/96 version given March 12, 2010.

## 2011-01-19 NOTE — Assessment & Plan Note (Signed)
Summary: NECK PROBLEMS/CB   CC:  lightheaded .  History of Present Illness: 70 y/o WM here  with known history of Osler-Weber-Rendu Dz and hereditary telangiectasia, GERD. IBS   10/09-- add-on visit due to dizziness... sudden episode yest working at [employer] around [employer] w/ room spinning- lasted and resolved spontaneously, felt light headed afterwards... no LOC, syncope, seizure activity, etc... no CP/ palpit/ SOB/ etc... there were several EMTs in the restaurant and they checked him out- stable and transported to ER... exam was neg- and told to see me right away... 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 he cannot breath out of the left side of his nose- this side is occluded secondary to prev surg and skin grafts... he is followed by Gayla Medicus who has told him there is nothing that anybody can do for that side of his nose...  ~  Aug09- some difficulty w/ pain in his feet secondary to plantar fasciitis eval & Rx from [facility] w/ Celebrex & shots- not much better so far... he may have to quit work and really retire!  ~  Sep09 saw TParrett w/ several recent bouts of CP- while working in yard, washing car, etc... resolved w/ rest & Protonix... EKG showed WNL, prev NuclearStressTest 1/04 at [facility] was neg- without ischemia or infarction and EF= 61%... he is set-up to see Cardiology on 10/16...  December 02, 2008 --Complains of 1 week of cough, congestion.-thick -unable to get up. Started on medrol dose pack - not much better. (on day 4).  August 21, 2009--Presents for an acute office visit. Complains of sinus pressure/congestion with no drianage, dry cough x4days.  Take otc with no help. Leaving for beach in couple of days. .Nose very stopped up cant breath out of. cheek pain w/ bending, teeth hurt.      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 he notes some leg pains, plantar fasciitis, & insomnia waking 3-4 times nightly (Ambien helps)... medically stable- occas nose bleeds, chest stable, due for FLP in the Simva40, requests refills for 2011...  June 04, 2010--Presents for a work in visit today. Over last 3 weeks has had intermittent lightheadness. 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 He has been using meclizine which is helping. He has been under alot of stress has possible trip to [place] that is coming up and concerned about this. Has not associated visual/speech changes, ext weakness. Worse w/ turning head, bending over. 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 this seems to aggravate his dizziness. Also over last week woke up stiffness along right side of neck, sore if he turns to the side. Feels like he has a "crick" in his neck. He is on mobic daily, no other meds used. Denies chest pain, dyspnea, orthopnea, hemoptysis, fever, n/v/d, edema, headache,recent travel , radicular symptoms , new meds, ext. weakness.      Preventive Screening-Counseling & Management  Alcohol-Tobacco     Smoking Status: never  Current Medications (verified): 1)  Claritin-D 12 Hour 5-120 Mg  Tb12 (Loratadine-Pseudoephedrine) .... As Needed 2)  Simvastatin 40 Mg Tabs (Simvastatin) .... Take 1 Tab By Mouth At  Bedtime.Marland KitchenMarland Kitchen 3)  Levothroid 125 Mcg  Tabs (Levothyroxine Sodium) .... Take 1 Tablet By Mouth Once A Day 4)  Protonix 40 Mg  Tbec (Pantoprazole Sodium) .... Take 1 Tablet By Mouth Once A Day As Needed 5)  Zantac 75 75 Mg Tabs (Ranitidine Hcl) .... Take 1 Tablet By Mouth Once A Day 6)  Librax 2.5-5 Mg  Caps (Clidinium-Chlordiazepoxide) .... Take 1 Tab By Mouth Three Times A Day As Needed Abd Cramping... 7)  Rapaflo 8 Mg Caps (Silodosin) .... Take 1 Tablet By Mouth Once A Day 8)   Clorazepate Dipotassium 7.5 Mg  Tabs (Clorazepate Dipotassium) .... Take 1 Tablet By Mouth Up To Three Times Daily As Needed For Anxiety.Marland KitchenMarland Kitchen 9)  Ambien 10 Mg  Tabs (Zolpidem Tartrate) .[name].. 1 By Mouth At Bedtime As Needed For Sleep... 10)  Centrum   Tabs (Multiple Vitamins-Minerals) .... Take 1 Tablet By Mouth Once A Day 11)  Meclizine Hcl 25 Mg Tabs (Meclizine Hcl) .... Take 1/2 To 1 Tab By Mouth 4 Times Daily As Directed For Dizziness... 12)  Meloxicam 7.5 Mg Tabs (Meloxicam) .... Take 1 Tab By Mouth Once Daily As Needed For Arthritis Pain...  Allergies (verified): No Known Drug Allergies  Past History:  Past Medical History: Last updated: 03/12/2010  OTHER DISEASES OF NASAL CAVITY AND SINUSES (ICD-478.19) Hx of BRONCHITIS (ICD-490) OSLER-WEBER-RENDU DISEASE (ICD-448.0) CHEST PAIN (ICD-786.50) HYPERCHOLESTEROLEMIA, MILD (ICD-272.0) HYPOTHYROIDISM (ICD-244.9) GERD (ICD-530.81) IRRITABLE BOWEL SYNDROME (ICD-564.1) COLONIC POLYPS (ICD-211.3) HYPERTROPHY PROSTATE W/UR OBST & OTH LUTS (ICD-600.01) BACK PAIN, LUMBAR (ICD-724.2) PLANTAR FASCIITIS (ICD-728.71) DIZZINESS (ICD-780.4) ANXIETY (ICD-300.00) INSOMNIA, CHRONIC (ICD-307.42) Hx of CARCINOMA, SKIN, 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)  Past Surgical History: Last updated: 03/12/2010 S/P left vaaricocele surgery in 1991 by DrRDavis S/P mult sinus & nasal surgery for 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 S/P pulse laser Rx for facial telangiectasias in the past  Review of Systems      See HPI  Vital Signs:  Patient profile:   70 year old male Height:      70 inches Weight:      188 pounds BMI:     27.07 O2 Sat:      91 % on Room air Temp:     96.6 degrees F oral Pulse rate:   66 / minute BP sitting:   118 / 70  (left arm) Cuff size:   regular  Vitals Entered By: Reynaldo Minium CMA (June 04, 2010 3:40 PM)  O2 Flow:  Room air CC: lightheaded  Comments Medications reviewed with patient and phone number/address  verified with patient today.Reynaldo Minium CMA  June 04, 2010 3:41 PM    Physical Exam  Additional Exam:  WD, WN, 70 y/o WM in NAD... GENERAL:  Alert & oriented; pleasant & cooperative... HEENT:  [place]/AT, EOM-wnl, PERRLA, EACs-clear, TMs-wnl, NOSE- occluded left nares from skin grafts, non tender sinus . THROAT- mucosal telangiectasias seen... similiar telangiectsis on lips etc... NECK:  Supple w/ fairROM; no JVD; normal carotid impulses w/o bruits; no thyromegaly or nodules palpated; no lymphadenopathy, neg nuchal rigidity.  CHEST:  Clear to P & A; without wheezes/ rales/ or rhonchi. HEART:  Regular Rhythm; without murmurs/ rubs/ or gallops. ABDOMEN:  Soft & nontender; normal bowel sounds; no organomegaly or masses detected. EXT: without deformities, mild arthritic changes; no varicose veins/ venous insuffic/ or edema. NEURO:  CN's intact; motor testing normal; sensory testing normal; gait normal & balance OK., neg rhomberg, neg head manuevrs, neg nystagmus , equal hand grips/strength.  Musculoskeletal: sl decreased  rom w/ lateral ROM of neck w/ reproducible pain, no radicular symptoms, tender along right lateral neck, suprascapular area, no rash seen.  DERM:  mult telangiectasias...     Impression & Recommendations:  Problem # 1:  DIZZINESS (ICD-780.4)  Flare of vertigo, labs pending.  Increase fluids , change positions slowly  meclizine as needed    Please contact office for sooner follow up if symptoms do not improve or worsen   Orders: TLB-BMP (Basic Metabolic Panel-BMET) (80048-METABOL) TLB-CBC Platelet - w/Differential (85025-CBCD) Est. Patient Level IV (16109)  Problem # 2:  CERVICAL STRAIN (ICD-847.0)   suspect neck strain.  Warm heat to neck and back as needed  Increase Mobic 7.5mg  2 tabs once daily for 7 days then 1 by mouth once daily  Skelaxin 800mg  1 by mouth three times a day as needed muscle spasm.  Please contact office for sooner follow up if symptoms do not  improve or worsen  follow up 3-4 weeks and as needed   Orders: Est. Patient Level IV (60454)  Medications Added to Medication List This Visit: 1)  Skelaxin 800 Mg Tabs (Metaxalone) .[name].. 1 by mouth three times a day as needed muscle spasm  Complete Medication List: 1)  Claritin-d 12 Hour 5-120 Mg Tb12 (Loratadine-pseudoephedrine) .... As needed 2)  Simvastatin 40 Mg Tabs (Simvastatin) .... Take 1 tab by mouth at bedtime.Marland KitchenMarland Kitchen 3)  Levothroid 125 Mcg Tabs (Levothyroxine sodium) .... Take 1 tablet by mouth once a day 4)  Protonix 40 Mg Tbec (Pantoprazole sodium) .... Take 1 tablet by mouth once a day as needed 5)  Zantac 75 75 Mg Tabs (Ranitidine hcl) .... Take 1 tablet by mouth once a day 6)  Librax 2.5-5 Mg Caps (Clidinium-chlordiazepoxide) .... Take 1 tab by mouth three times a day as needed abd cramping... 7)  Rapaflo 8 Mg Caps (Silodosin) .... Take 1 tablet by mouth once a day 8)  Clorazepate Dipotassium 7.5 Mg Tabs (Clorazepate dipotassium) .... Take 1 tablet by mouth up to three times daily as needed for anxiety.Marland KitchenMarland Kitchen 9)  Ambien 10 Mg Tabs (Zolpidem tartrate) .[name].. 1 by mouth at bedtime as needed for sleep... 10)  Centrum Tabs (Multiple vitamins-minerals) .... Take 1 tablet by mouth once a day 11)  Meclizine Hcl 25 Mg Tabs (Meclizine hcl) .... Take 1/2 to 1 tab by mouth 4 times daily as directed for dizziness... 12)  Meloxicam 7.5 Mg Tabs (Meloxicam) .... Take 1 tab by mouth once daily as needed for arthritis pain... 13)  Skelaxin 800 Mg Tabs (Metaxalone) .[name].. 1 by mouth three times a day as needed muscle spasm  Other Orders: TLB-Hepatic/Liver Function Pnl (80076-HEPATIC)  Patient Instructions: 1)  Increase fluids , change positions slowly  2)  I will call with lab results.  3)  Warm heat to neck and back as needed  4)  Increase Mobic 7.5mg  2 tabs once daily for 7 days then 1 by mouth once daily  5)  Skelaxin 800mg  1 by mouth three times a day as needed muscle spasm.  6)  Please contact  office for sooner follow up if symptoms do not improve or worsen  7)  follow up 3-4 weeks and as needed  Prescriptions: MECLIZINE HCL 25 MG TABS (MECLIZINE HCL) take 1/2 to 1 tab by mouth 4 times daily as directed for dizziness...  #100 x 5   Entered and Authorized by:   Rubye Oaks NP   Signed by:   Wister Hoefle NP on 06/04/2010   Method  used:   Electronically to        [facility]  [phone]* (retail)       [address] [place], [place]  [address]       Ph: [phone] or [phone]       Fax: [phone]   RxID:   [id number] SKELAXIN 800 MG TABS (METAXALONE) 1 by mouth three times a day as needed muscle spasm  #30 x 0   Entered and Authorized by:   Rubye Oaks NP   Signed by:   Rubye Oaks NP on 06/04/2010   Method used:   Electronically to        [facility]  [phone]* (retail)       [address] [place], [place]  [address]       Ph: [phone] or [phone]       Fax: [phone]   RxID:   [id number]

## 2011-01-20 ENCOUNTER — Encounter: Payer: Self-pay | Admitting: Pulmonary Disease

## 2011-01-21 NOTE — Letter (Signed)
Summary: [facility]   Imported By: Sherian Rein 01/01/2011 13:52:34  _____________________________________________________________________  External Attachment:    Type:   Image     Comment:   External Document

## 2011-03-02 NOTE — Procedures (Signed)
Summary: Colonoscopy/[facility]  Colonoscopy/[facility]   Imported By: Sherian Rein 02/24/2011 10:05:20  _____________________________________________________________________  External Attachment:    Type:   Image     Comment:   External Document

## 2011-03-19 ENCOUNTER — Encounter: Payer: Self-pay | Admitting: Pulmonary Disease

## 2011-03-23 ENCOUNTER — Other Ambulatory Visit (INDEPENDENT_AMBULATORY_CARE_PROVIDER_SITE_OTHER): Payer: Medicare Other

## 2011-03-23 ENCOUNTER — Encounter: Payer: Self-pay | Admitting: Pulmonary Disease

## 2011-03-23 ENCOUNTER — Ambulatory Visit (INDEPENDENT_AMBULATORY_CARE_PROVIDER_SITE_OTHER): Payer: Medicare Other | Admitting: Pulmonary Disease

## 2011-03-23 DIAGNOSIS — K219 Gastro-esophageal reflux disease without esophagitis: Secondary | ICD-10-CM

## 2011-03-23 DIAGNOSIS — I780 Hereditary hemorrhagic telangiectasia: Secondary | ICD-10-CM

## 2011-03-23 DIAGNOSIS — E7800 Pure hypercholesterolemia, unspecified: Secondary | ICD-10-CM

## 2011-03-23 DIAGNOSIS — R079 Chest pain, unspecified: Secondary | ICD-10-CM

## 2011-03-23 DIAGNOSIS — N139 Obstructive and reflux uropathy, unspecified: Secondary | ICD-10-CM

## 2011-03-23 DIAGNOSIS — M79606 Pain in leg, unspecified: Secondary | ICD-10-CM

## 2011-03-23 DIAGNOSIS — K589 Irritable bowel syndrome without diarrhea: Secondary | ICD-10-CM

## 2011-03-23 DIAGNOSIS — N401 Enlarged prostate with lower urinary tract symptoms: Secondary | ICD-10-CM

## 2011-03-23 DIAGNOSIS — E039 Hypothyroidism, unspecified: Secondary | ICD-10-CM

## 2011-03-23 DIAGNOSIS — D126 Benign neoplasm of colon, unspecified: Secondary | ICD-10-CM

## 2011-03-23 LAB — CBC WITH DIFFERENTIAL/PLATELET
Basophils Absolute: 0 10*3/uL (ref 0.0–0.1)
Basophils Relative: 0.4 % (ref 0.0–3.0)
Eosinophils Absolute: 0.1 10*3/uL (ref 0.0–0.7)
HCT: 46.6 % (ref 39.0–52.0)
Hemoglobin: 16 g/dL (ref 13.0–17.0)
Lymphs Abs: 3 10*3/uL (ref 0.7–4.0)
MCHC: 34.3 g/dL (ref 30.0–36.0)
MCV: 91.9 fl (ref 78.0–100.0)
Neutro Abs: 4.4 10*3/uL (ref 1.4–7.7)
RBC: 5.08 Mil/uL (ref 4.22–5.81)
RDW: 13.6 % (ref 11.5–14.6)

## 2011-03-23 MED ORDER — LORATADINE 10 MG PO TABS: 10.0000 mg | ORAL_TABLET | Freq: Every day | ORAL | Status: DC

## 2011-03-23 MED ORDER — SIMVASTATIN 40 MG PO TABS: 40.0000 mg | ORAL_TABLET | Freq: Every day | ORAL | Status: DC

## 2011-03-23 NOTE — Progress Notes (Signed)
Subjective:    Patient ID: Gregory [name], male    DOB: [birth year], 70 y.o.   MRN: [id number]  HPI 70 y/o WM here for a follow up visit... he has multiple medical problems including:  Hx OWR syndrome;  Hx atypCP;  Hyperchol;  Hypothyroidism;  GERD/ Divertics/ IBS/ Colon polyps;  BPH/ BOO;  DJD/ LBP/ Plantar fasciitis;  Hx dizziness;  Anxiety & chronic persistant insomnia...  ~  September 22, 2010:  he has several complaints today- musc cramps, dry throat & cough (mostly due to chr nasal prob & mouth breathing), dizzy spells (mostly when bending over w/ valsalva)... we discussed humidification, saline mist, MucinexDM, & physiology of the valsalva to avoid this problem... he denies interval bleeding, CP, SOB, etc... he would like the Flu shot, Rx for Shingles vaccine, & refill of meds.  ~  March 23, 2011:  6 mo [name] & his CC is leg pain/ nocturnal leg cramps x several months & pretty severe> he has scanned the internet & his questions were answered to the bewst of my ability;  We discussed avail remedies> Tonic water, Mustard, bar of soap in the bed, & Rx written for MagOx 400mg Qhs;  He also wants to stop the Simvastatin 40mg  for 13mo & see (he will call & let me know)...    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    Hx CP>  Prev cardiac w/u by DrCrenshaw was neg, no recurrent CP complaints, he remains active, etc...    Chol>  On Simva40 but wants to stop for 13mo due to the nocturnal leg pain he is havingFLP on med today shows TChol 104, TG 62, HDL 29, LDL 63...    Hypothy>  On Synthroid 144mcg/d>  Labs reveal TSH= 0.33 & review of flow sheet shows borderline over suppressed so we decided to decr his dose sl to 165mcg/d...    GI>  Followed by [facility] & had colonoscopy 3/12 at [facility]- neg x for hems & DrM rec f/u 8yrs...    GU>  Hx BPH w/ BOO &  followed by [id number] on Rapaflo 8ng/d;  PSA done today = 1.22...    Ortho>  eval 12/11 by DrNorris for bilat leg pain & cramping, ?min atrophy of left gastroc- he rec tonic water & neurology eval> he saw DrWillis 3/12 (note reviewed)> NCV was normal; EMG c/w mild S1 radiculopathy; he rec MRI- pending & to consider Zanaflex or Baclofen Rx...    Anxiety/ Insomnia>  He has Chlorazepate for Prn use, and Ambien to use for sleep Qhs...     Health Maintenance: ~  GI:  followed by [facility] & up to date on colon etc... (last 1/07 & 92yr f/u due 1/12). ~  GU:  followed by DrRDavis, and he did his PSA this yr. ~  Immunizations:  he getsyearly Flu vaccinations in the fall;  OK Pneumovax 03/12/10 at age[age]...   Past Medical History  Diagnosis Date  . Other diseases of nasal cavity and sinuses   . Bronchitis, not specified as acute or chronic   . Hereditary hemorrhagic telangiectasia   . Pure hypercholesterolemia   . Unspecified hypothyroidism   . Esophageal reflux   . Irritable bowel syndrome   . Benign neoplasm of colon   . Hypertrophy of prostate with urinary obstruction and other lower urinary tract symptoms (LUTS)   .  Lumbago   . Plantar fascial fibromatosis   . Dizziness and giddiness   . Anxiety state, unspecified   . Persistent disorder of initiating or maintaining sleep   . Squamous cell carcinoma of skin     Past Surgical History  Procedure Date  . Varicocelectomy 1991    Dr. Monico Blitz  . 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 Other surgical history     pulse laser for facial telangiectasias inthe past    Outpatient Encounter Prescriptions as of 03/23/2011  Medication Sig Dispense Refill  . clidinium-chlordiazePOXIDE (LIBRAX) 2.5-5 MG per capsule Take 1 capsule by mouth 3 (three) times daily as needed.        . clorazepate (TRANXENE) 7.5 MG tablet Take 7.5 mg by mouth 3 (three) times daily as needed.        [name] levothyroxine (SYNTHROID,  LEVOTHROID) 125 MCG tablet Take 125 mcg by mouth daily.        [name] loratadine-pseudoephedrine (CLARITIN-D 12-HOUR) 5-120 MG per tablet Take 1 tablet by mouth as needed.        . meclizine (ANTIVERT) 25 MG tablet Take 1/2 tablet to 1 whole tablet by mouth 4 times daily as directed for dizziness       . meloxicam (MOBIC) 7.5 MG tablet Take 7.5 mg by mouth daily as needed.        . metaxalone (SKELAXIN) 800 MG tablet Take 800 mg by mouth 3 (three) times daily as needed.        . Multiple Vitamins-Minerals (CENTRUM) tablet Take 1 tablet by mouth daily.        . pantoprazole (PROTONIX) 40 MG tablet Take 40 mg by mouth daily.        . ranitidine (ZANTAC) 75 MG tablet Take 75 mg by mouth daily.        . Silodosin (RAPAFLO) 8 MG CAPS Take 1 capsule by mouth daily.        . simvastatin (ZOCOR) 40 MG tablet Take 40 mg by mouth at bedtime.        [name] zolpidem (AMBIEN) 10 MG tablet Take 10 mg by mouth at bedtime as needed.          No Known Allergies    Review of Systems       See HPI - other systems neg except as noted...  The patient denies anorexia, fever, weight loss, weight gain, vision loss, decreased hearing, hoarseness, chest pain, syncope, dyspnea on exertion, peripheral edema, prolonged cough, headaches, hemoptysis, abdominal pain, melena, hematochezia, severe indigestion/heartburn, hematuria, incontinence, muscle weakness, suspicious skin lesions, transient blindness, difficulty walking, depression, unusual weight change, abnormal bleeding, enlarged lymph nodes, and angioedema.     Objective:   Physical Exam      WD, WN, 70 y/o WM in NAD... GENERAL:  Alert & oriented; pleasant & cooperative... HEENT:  [place]/AT, EOM-wnl, PERRLA, EACs-clear, TMs-wnl, NOSE- occluded left nares from skin grafts, MOUTH- mucosal telangiectasias.  NECK:  Supple w/ fairROM; no JVD; normal carotid impulses w/o bruits; no thyromegaly or nodules palpated; no lymphadenopathy. CHEST:  Clear to P & A; without wheezes/ rales/  or rhonchi. HEART:  Regular Rhythm; without murmurs/ rubs/ or gallops. ABDOMEN:  Soft & nontender; normal bowel sounds; no organomegaly or masses detected. EXT: without deformities, mild arthritic changes; no varicose veins/ venous insuffic/ or edema. NEURO:  CN's intact; motor testing normal; sensory testing normal; gait normal & balance OK. DERM:  mult telangiectasias...   Assessment & Plan:

## 2011-03-23 NOTE — Patient Instructions (Signed)
Today we updated your med list...    We removed the Zantac Ranitadine) from your prev list...    Have your Pharm call for any needed refills in the interim...  Today we did your follow up FASTING blood work...    Please call the PHONE TREE in a few days for your lab results:    Dial [phone] & when prompted enter your pt number followed by the # symbol>    Your pt number =  [phone]#  For your leg cramps:  I recommend that you keep a diary of treatments & responses>>>    We decided to HOLD/ STOP the Simvastatin for 1 month (per DrWillis)..    Try the TONIC WATER in increasing amounts at bedtime...    Try the MUSTARD trick per the [facility]> look them up on-line...    Try the "bar of soap in the bed" trick...    Start the MAG OXIDE 400mg  nightly as suggested by DrWillis...    You need to incr your exercise program during the day...    Call us in 1 month to report your progress so we can decide regarding the Stain med, and consider alternative meds as trials for your discomfort...  Let's plan a routine [name] in 6 months, sooner as needed.Marland KitchenMarland Kitchen

## 2011-03-24 LAB — HEPATIC FUNCTION PANEL
AST: 21 U/L (ref 0–37)
Albumin: 4.2 g/dL (ref 3.5–5.2)
Alkaline Phosphatase: 59 U/L (ref 39–117)
Bilirubin, Direct: 0.2 mg/dL (ref 0.0–0.3)
Total Bilirubin: 1.2 mg/dL (ref 0.3–1.2)

## 2011-03-24 LAB — BASIC METABOLIC PANEL
Calcium: 9.5 mg/dL (ref 8.4–10.5)
GFR: 68.5 mL/min (ref >60.00)
Glucose, Bld: 85 mg/dL (ref 70–99)
Potassium: 5 mEq/L (ref 3.5–5.1)
Sodium: 143 mEq/L (ref 135–145)

## 2011-03-24 LAB — LIPID PANEL
Total CHOL/HDL Ratio: 4
VLDL: 12.4 mg/dL (ref 0.0–40.0)

## 2011-03-25 ENCOUNTER — Other Ambulatory Visit: Payer: Self-pay | Admitting: *Deleted

## 2011-03-25 MED ORDER — LEVOTHYROXINE SODIUM 112 MCG PO TABS: 112.0000 ug | ORAL_TABLET | Freq: Every day | ORAL | Status: DC

## 2011-03-25 NOTE — Telephone Encounter (Signed)
Pt is aware of change in thyroid meds from 125 to 112.  Pt will call for further questions or concerns

## 2011-03-27 ENCOUNTER — Encounter: Payer: Self-pay | Admitting: Pulmonary Disease

## 2011-03-27 DIAGNOSIS — M79606 Pain in leg, unspecified: Secondary | ICD-10-CM | POA: Insufficient documentation

## 2011-03-27 NOTE — Assessment & Plan Note (Signed)
He has been stable on his replacement Synthroid 199mcg/d but TSH sl oversuppressed at 0.33;  W/ his leg discomfort etc I suggest decr to daily.Marland KitchenMarland Kitchen

## 2011-03-27 NOTE — Assessment & Plan Note (Signed)
Stable on his diet + Simva40;  Continue same.Marland KitchenMarland Kitchen

## 2011-03-27 NOTE — Assessment & Plan Note (Signed)
His CC is nocturnal leg pain as noted in the HPI;  He wants to stop the Simva40 for 1 month- OK;  We discussed RX for nocturnal leg cramps w/ Tonic water, mustard, etc;  eval by DrWillis w/ ?S1 radiculopathy> MRI L/S spine is pending w/ f/u by Neuro.Marland KitchenMarland Kitchen

## 2011-03-27 NOTE — Assessment & Plan Note (Signed)
As noted> he is followed by [facility] w/ GERD, IBS, Divertics & polyps;  On Librax prn & doing satis;  He had f/u colon 3/12= neg w/o recurrent polyps & DrM plans f/u colon in another 48yrs.Marland KitchenMarland Kitchen

## 2011-03-27 NOTE — Assessment & Plan Note (Signed)
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 Currently stable w/o intercurrent bleeding or other problems identified.Gregory KitchenMarland [name]

## 2011-03-27 NOTE — Assessment & Plan Note (Signed)
We did his PSA this yr while he tries to decide [facility];  PSA= 1.22 OK.Gregory KitchenMarland [name]

## 2011-04-09 ENCOUNTER — Encounter: Payer: Self-pay | Admitting: Pulmonary Disease

## 2011-04-28 ENCOUNTER — Telehealth: Payer: Self-pay | Admitting: Pulmonary Disease

## 2011-04-28 NOTE — Telephone Encounter (Signed)
ATC line busy x 2 

## 2011-04-29 NOTE — Telephone Encounter (Signed)
Called, spoke with pt.  States he was told to call back in 1 month after being off statin.  States he is much better.  Not having anymore pains in legs.  Requesting SN's recs.    nkda - verified [facility]  Dr. Kriste Basque, pls advise.  Thanks!

## 2011-04-30 NOTE — Telephone Encounter (Signed)
Spoke with pt to verify that he got to speak with SN.  He states that he did and has no further questions. Marked zocor as allergy in his chart.

## 2011-04-30 NOTE — Telephone Encounter (Signed)
Per SN--please mark chart allergic to simvastatin--caused leg cramps.--SN called pt  on 5-10 and he is to leave the simvastatin 40 off and try diet for several months--low chol, low fat diet and he will call when ready for recheck lip panel on diet alone.  thanks

## 2011-05-04 NOTE — Assessment & Plan Note (Signed)
[facility]                            CARDIOLOGY OFFICE NOTE   NAME:Gregory [name], Gregory [name]                     MRN:          [id number]  DATE:10/04/2008                            DOB:          05-Dec-1941    Mr. Velis is a 70 year old male who I am asked to evaluate for chest  pain and a presyncopal episode.  Note, he did have a Myoview performed  at [facility] in January 2004 that showed no ischemia or  infarction.  His ejection fraction was 61%.  He typically does not have  dyspnea on exertion, orthopnea, PND, pedal edema, palpitations,  presyncope, syncope or exertional chest pain.  Approximately 1 month  ago, the patient had chest pain while washing his car.  It was in the  substernal area and did not radiate.  It was described as a sharp pain  and lasted for 15-20 minutes.  It resolved spontaneously after sitting  down.  Note, the pain was not pleuritic, positional, nor was it related  to food.  There was no associated nausea, vomiting, shortness of breath,  or diaphoresis.  He had 2 subsequent episodes that were not related to  activity.  He was seen by his primary care physician and started on  Protonix and he has had no further chest pain.  He has had no symptoms  in the past 2 weeks.  He also states that approximately 2 weeks ago  while working at [employer] he had a dizzy spell.  He said it was  sudden in onset and there was no associated chest pain, nausea,  vomiting, shortness of breath, or diaphoresis.  There were no  palpitations.  There was no incontinence or loss of sensation or  strength in his extremities.  There was no associated headaches.  It  lasted for 30 minutes and resolved spontaneously.  Note, he did not have  a frank syncopal episode.  Because of the above, Cardiology was asked to  further evaluate.   PRESENT MEDICATIONS:  1. Levofloxacin 125 mcg p.o. daily.  2. Celebrex 200 mg as needed.  3. Multivitamin.  4.  Clidinium.  5. Flomax.  6. Ambien CR.  7. Protonix.  He also takes:  1. Clorazepate as needed.  2. Tylenol as needed.  3. Claritin as needed.  4. Aleve as needed.  5. Meclizine as needed.   ALLERGIES:  He has no known drug allergies.   SOCIAL HISTORY:  He does not smoke, nor does he consume alcohol.  He is  married.   FAMILY HISTORY:  Negative coronary artery disease, although he did say  that his mother had a stroke.   PAST MEDICAL HISTORY:  There is no diabetes mellitus or hypertension,  but there is a history of mild hyperlipidemia.  He does have a history  of hypothyroidism.  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  He also has known GI  telangiectasias.  He has a history of gastroesophageal reflux disease  as  well as irritable bowel syndrome and colonic polyps.  He also has  prostatic hypertrophy.  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  He has had previous  surgery on his nose.  He has had previous varicocele surgery.   REVIEW OF SYSTEMS:  He denies any headaches, fevers, or chills.  No  productive cough or hemoptysis.  There is no dysphagia, odynophagia,  melena, or hematochezia.  There is no dysuria or hematuria.  There is no  rash or seizure active.  There is no orthopnea, PND, or pedal edema.  The remaining systems are negative.   PHYSICAL EXAMINATION:  VITAL SIGNS:  Today, shows a blood pressure of  90/62 and his pulse is 52.  He weighs 185 pounds.  GENERAL:  He is well-developed, well-nourished, in no acute distress.  SKIN:  Warm and dry.  He does not appear to be depressed.  There is no  peripheral clubbing.  BACK:  Normal.  HEENT:  Normal with normal eyelids.  NECK:  Supple with a normal upstroke bilaterally.  No bruits noted.  There are no jugular venous distention.  I cannot  appreciate  thyromegaly.  CHEST:  Clear.  No expansion.  CARDIOVASCULAR:  Regular rhythm.  Normal S1 and S2.  There are no  murmurs, rubs, or gallops noted.  ABDOMEN:  Nontender and nondistended.  Positive bowel sounds.  No  hepatosplenomegaly.  No mass appreciated.  There is no abdominal bruit.  EXTREMITIES:  He has 2+ femoral pulses bilaterally and bruits.  Extremities show no edema and I can palpate no cords.  He has 2+  dorsalis pedis pulses bilaterally.  NEUROLOGIC:  Grossly intact.   His electrocardiogram shows a sinus rhythm at a rate of  52.  The axis  is normal.  There are no ST changes noted.  Note, the patient had a  recent head CT after his dizzy spell that showed no significant  abnormalities.  He also had blood work, which showed normal renal  function and normal liver functions as well as a normal hemoglobin and  hematocrit.   DIAGNOSIS:  1. Chest pain - Mr. Mussa symptoms are somewhat atypical and his      electrocardiogram is normal.  We will plan to proceed with a stress      Myoview.  If this shows no ischemia, then we will not pursue      further evaluation at this point.  2. Presyncopal episode - the etiology of this is unclear to me but      does not appear to be arrhythmogenic.  His symptoms lasted for      approximately 30 minutes and he did not have frank syncope.  I will      schedule him to have echocardiogram to quantify his left      ventricular function.  If it is normal, then we will follow him      expectantly.  If he has recurrent episodes in the future, then      would consider a CardioNet monitor.  3. History of mild hyperlipidemia - management per Dr. Kriste Basque.  4. Osler-Weber-Rendu.  5. Hypothyroidism - he will continue on his present medications.  6. History of gastroesophageal reflux disease.  7. Benign prostatic hypertrophy.   We will see him back in 6 weeks to review the above studies and his  symptoms.      Madolyn Frieze Jens Som, MD,  [facility]  Electronically Signed    BSC/MedQ  DD: 10/04/2008  DT: 10/05/2008  Job #: [id number]   cc:   Lonzo Cloud. Kriste Basque, MD

## 2011-05-04 NOTE — Op Note (Signed)
NAME:  Gregory [name], KIRBY NO.:  [id number]   MEDICAL RECORD NO.:  [id number]          PATIENT TYPE:  AMB   LOCATION:  DSC                          FACILITY:  [facility]   PHYSICIAN:  Katy Fitch. Sypher, M.D. DATE OF BIRTH:  Jun 25, 1941   DATE OF PROCEDURE:  05/30/2009  DATE OF DISCHARGE:                               OPERATIVE REPORT   PREOPERATIVE DIAGNOSIS:  Large left dorsal ganglion overlying midcarpal  joint.   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   OPERATION:  Debridement of myxoid cyst, dorsal aspect, left wrist.   OPERATING SURGEON:  Katy Fitch. Sypher, MD   ASSISTANT:  Marveen Reeks. Dasnoit, PA-C.   ANESTHESIA:  General by LMA.   SUPERVISING ANESTHESIOLOGIST:  Quita Skye. Krista Blue, MD   INDICATIONS:  Gregory [name] is a 70 year old gentleman who referred  through the courtesy of Dr. Alroy Dust for evaluation and management of  a mass over the dorsal aspect of his left wrist.  Clinical examination  revealed a complex myxoid cyst presenting between the fourth and second  dorsal compartments.  This had been present for a while.  We had a  detailed informed consent during which we explained that we could remove  this, but could not guarantee that there would not be recurrence of the  degenerative myxoid cyst.  Gregory [name] decide he like to proceed with  excision.  After informed consent, he was now brought to the operating  room at this time.  Preoperatively, he was interviewed by Dr. Krista Blue who  advised general anesthesia by LMA technique.  Questions were invited and  answered in detail.   PROCEDURE:  [name] was brought to room #1 of the [facility] and placed in supine position upon on the operating  table.  Following the induction of general anesthesia by LMA technique,  the left arm was  prepped with Betadine soap solution and sterilely  draped.  A pneumatic tourniquet was applied to the proximal left  brachium.   Following exsanguination of the left arm with Esmarch bandage, the  arterial tourniquet was inflated to 230 mmHg.  The procedure commenced  with a short transverse scission directly over the palpably enlarged and  cystic mass.  Subcutaneous tissues were carefully divided taking care to  identify the extensor retinaculum.  The cyst was bulging if the distal  margin of the extensor retinaculum was circumferentially dissected from  the retinaculum.  This was presenting between the second and fourth  dorsal compartments.  This was a complex multilobular cyst that was  followed to its root.  This had extruded through the dorsal capsule of  the midcarpal joint and the capsule of the radiocarpal articulation  adjacent to the scapholunate ligament.  The cyst was drained of its  contents and all of the roots of the cysts were removed with a  microrongeur.  We explore the midcarpal joint and the region of  scapholunate ligament identifying areas of discolored and degenerative  intercarpal ligament.  These were gently debrided with a microrongeur  and bleeding points electrocauterized.   The wound was then inspected for other signs of myxoid degenerative cyst  formation.  I could not identify any other extensions near the proximal  carpal row.   Given the fact that a dime-sized area was resected with the cyst over  the neck of the capitate.  We will allow this to heal by secondary  intention.   The wound was then repaired with subcutaneous suture of 4-0 Vicryl and  intradermal 3-0 Prolene with Steri-Strips.  Lidocaine 2% was infiltrate  for postop analgesia.  Mr. Marchio tolerated the surgery and anesthesia  well.   For aftercare, he was provided description for Vicodin 5 mg one p.o. 4-6  h. p.r.n. pain, 20 tablets without refill.      Katy Fitch Sypher, M.D.   Electronically Signed     RVS/MEDQ  D:  05/30/2009  T:  05/31/2009  Job:  [id number]

## 2011-05-04 NOTE — Assessment & Plan Note (Signed)
[facility]                            CARDIOLOGY OFFICE NOTE   NAME:Gregory [name], Gregory [name]                     MRN:          [id number]  DATE:11/21/2008                            DOB:          [birth year]    Mr. Knebel is a pleasant 70 year old gentleman who I recently saw on  October 04, 2008, secondary to chest pain and a presyncopal episode.  We  did schedule the patient to have a stress Myoview.  This was performed  on October 17, 2008.  It was normal with an ejection fraction of 58%.  An echocardiogram was also performed on October 17, 2008.  His LV  function was normal.  There was mild RV and RA, but otherwise there was  no significant abnormalities noted.  Since I saw him, he does state that  he occasionally feels dizzy with turning his head in certain ways.  However, Dr. Kriste Basque gave him Antivert and his symptoms have resolved.  He  denies any dyspnea on exertion, orthopnea, PND, pedal edema,  palpitations, presyncope, syncope, or exertional chest pain.  He has had  no further chest pain since we saw him in the office.   His present medications include:  1. Levothyroxine 125 mcg daily.  2. Celebrex.  3. Multivitamin.  4. Flomax.  5. Protonix.  6. Crestor 10 mg daily.   His physical exam shows a blood pressure of 106/54 and his pulse is 62.  His HEENT is normal.  His neck is supple.  His chest is clear.  His  cardiovascular exam is regular rate and rhythm.  Abdominal exam shows no  tenderness.  Extremities show no edema.   DIAGNOSES:  1. Chest pain - Mr. Wexler Myoview is normal and he has had no      further symptoms.  We will not pursue this further at this point.  2. Recent presyncopal episodes - his left ventricular function is      normal.  I am not convinced that this was related to a cardiac      issue.  He is having some dizziness with turning his head in      certain ways and it has improved with Antivert.  I will leave this  issue to Dr. Kriste Basque.  3. History of mild hyperlipidemia - he will continue on his statin and      this is being followed by Dr. Kriste Basque.  4. 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  5. Hypothyroidism.  6. History of gastroesophageal reflux disease.  7. Benign prostatic hypertrophy.   Given that his symptoms have improved, we will see him back on as-needed  basis.     Madolyn Frieze Jens Som, MD, [facility]  Electronically Signed    BSC/MedQ  DD: 11/21/2008  DT: 11/21/2008  Job #: [id number]   cc:   Lonzo Cloud. Kriste Basque, MD

## 2011-05-07 NOTE — Discharge Summary (Signed)
NAME:  Gregory [name], Gregory [name]                        ACCOUNT NO.:  [id number]   MEDICAL RECORD NO.:  [id number]                   PATIENT TYPE:  INP   LOCATION:  2020                                 FACILITY:  [facility]   PHYSICIAN:  Charlies Constable, M.D. [facility]              DATE OF BIRTH:  1941-08-04   DATE OF ADMISSION:  01/03/2003  DATE OF DISCHARGE:  01/04/2003                           DISCHARGE SUMMARY - REFERRING   PROCEDURES:  1. CT of the chest.  2. Gated exercise treadmill Cardiolite.   HOSPITAL COURSE:  The patient is a 69 year old male with no known history of  coronary artery disease who woke up at 4 a.m. with substernal chest pain  that was associated with nausea, diaphoresis and dizziness.  His major  presenting symptom was nausea and he had 2/ or 3/10 substernal chest pain  that was described as a tightness for about 15 minutes.  He took his blood  pressure at that time and it was 148/76, which is high for him, and his  heart rate was 105, which is also higher than usual.  He came to the ER with  symptoms of nausea and dizziness.  He had taken aspirin at home and shortly  after arriving in the emergency room, he began complaining of a burning  substernal chest pain/epigastric pain that was different from his earlier  chest tightness.  Protonix was given and after vomiting up some bilious-  looking fluid that also looked like it might have some coffee-grounds  particles in it, he felt much better.  He was admitted to rule out MI and  for further evaluation.  His enzymes were negative for MI and so he was  scheduled for a gated exercise treadmill test that was performed on January 04, 2003.  His target heart rate was 136 and he reached a maximum heart rate  of 145.  He had leg fatigue as his limiting factor and no chest pain.  He  had no EKG changes.  The Cardiolite images showed no ischemia and an EF of  61%.  No further cardiac workup is indicated at this time.   The patient's  chest x-ray showed a possible mass at the lower edge of the  right middle lobe and a chest CT was suggested.  This was ordered and he had  a chest CT with contrast performed.  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  The largest portion of this is located inferiorly and measures  3 x 4 cm in maximum diameter.  There is an enlarged draining inferior  pulmonary vein on the right as well as a enlarged feeding lower lobe  pulmonary artery.  The lungs are mildly hyperexpanded and a small hiatal  hernia is noted.  No enlarged lymph nodes are seen.  Dr. Lonzo Cloud. Kriste Basque will  be made aware of these  results and followup will be per him.   With resolution of his symptoms and a normal Cardiolite, the patient was  considered stable for discharge on January 04, 2003, with outpatient  followup needed by his primary care physician and cardiology followup p.r.n.  only.   DISCHARGE CONDITION:  Stable.   DISCHARGE DIAGNOSES:  1. Chest pain, negative myocardial infarction by enzymes and negative     ischemia by Cardiolite.  2. 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  3. Hypothyroidism, thyroid-stimulating hormone 5.980 this admission, and     medications changes per Dr. Kriste Basque.  4. Gastroesophageal reflux disease symptoms.  5. History of epistaxis.  6. History of a rash to some kind of nonsteroidal anti-inflammatory drug     other than Celebrex, which he takes without difficulty.  7. History of nasal surgery.   DISCHARGE INSTRUCTIONS:  1. His activity level is to be as tolerated.  2. He is to get Tylenol for pain.  3. He is to stick to a low-salt and -fat diet.  4. He is to follow up with Dr. Kriste Basque.  5. He is to follow up with Dr. Charlies Constable on a p.r.n. basis.   DISCHARGE MEDICATIONS:  1.  Celebrex 200 mg daily.  2. Synthroid 112 mcg daily.  3. Protonix 40 mg daily.       Lavella Hammock, P.A. [facility]                  Charlies Constable, M.D. [facility]    RG/MEDQ  D:  01/04/2003  T:  01/05/2003  Job:  [id number]   cc:   Lonzo Cloud. Kriste Basque, M.D. [facility]

## 2011-05-07 NOTE — Consult Note (Signed)
[place]  Patient:    Gregory [name], Gregory [name]                     MRN: [id number] Proc. Date: 03/02/01 Adm. Date:  [id number] Attending:  Lorre Nick                          Consultation Report  DATE OF ENCOUNTER:  March 02, 2000, 8 p.m.  SITE:  [facility] Emergency Department.  REASON FOR CONSULTATION:  Epistaxis, history of Osler-Weber-Rendu syndrome.  HISTORY OF PRESENT ILLNESS:  The patient is a 70 year old gentleman who is a long-term patient of Dr. Elvera Maria who has hereditary hemorrhagic telangiectasia (Osler-Weber-Rendu) who periodically comes in for nosebleeds. He was treated by Dr. Gerilyn Pilgrim a few days prior, also in the middle of the night in the emergency room for left-sided epistaxis.  It started up again this evening shortly before coming into the hospital.  It has been exclusively on the left side this time.  He has not been able to get it stopped with simple measures.  PAST MEDICAL HISTORY:  As above.  No history of coronary or pulmonary disease.  CURRENT MEDICATIONS:  None.  ALLERGIES:  He is allergic to LODINE.  PHYSICAL EXAMINATION:  GENERAL APPEARANCE:  He is a pleasant, healthy-appearing gentleman with a large amount of bright red blood filling the nasal cavities and on the face.  HEENT:  The nasal cavities were suctioned of blood and secretions, and the left nasal cavity was anesthetized first with topical cocaine solution on cotton pledgets and then injected with 1% Xylocaine with 1:200,000 epinephrine along the mid inferior septum where the bleeding site was identified.  The bleeding site was a small venule just under the surface that was bleeding fairly profusely approximately 1.5 cm behind the caudal extent of the septum. Suction cautery device was used on a setting of 25 watts to cauterize this entire area.  There was still a very, very slow ooze after that from the surrounding tissue.  Total area of almost 1 cm  circumference was cauterized. The nasal cavity was packed with a Merocel nasal pack that was cut down in width and length and coated with bacitracin ointment.  It was inflated with the local anesthesia solution.  There was no further bleeding noted.  DISCHARGE INSTRUCTIONS:  The patient was discharged to home, instructed to take Keflex 500 mg t.i.d. until the packing came out.  DISCHARGE FOLLOWUP:  He was instructed to follow up in our office on Monday or sooner if he has any additional problems.  DISCHARGE MEDICATIONS:  Prescription was given for Tylenol No. 3, #25, to use one to two q.4-6h. as needed for discomfort and pain. DD:  03/03/01 TD:  03/03/01 Job: [id number]

## 2011-05-07 NOTE — H&P (Signed)
NAME:  Gregory [name], WALDER NO.:  [id number]   MEDICAL RECORD NO.:  [id number]                   PATIENT TYPE:  EMS   LOCATION:  MAJO                                 FACILITY:  [facility]   PHYSICIAN:  Charlies Constable, M.D. [facility]              DATE OF BIRTH:  [birth year]   DATE OF ADMISSION:  01/03/2003  DATE OF DISCHARGE:                                HISTORY & PHYSICAL   PRIMARY CARE PHYSICIAN:  Lonzo Cloud. Kriste Basque, M.D. [facility]   CHIEF COMPLAINT:  Chest pain and nausea.   CLINICAL HISTORY:  The patient is a 70 year old gentleman with no prior  history of known heart disease. He awoke at 4 a.m. with mild to moderate  substernal tightness, nausea and dizziness which he described as light  headedness. He also had some diaphoresis. His symptoms persisted and he took  an aspirin and came to the emergency room. He vomited up some clear material  in the emergency room.   He has had no prior history of chest pain, shortness of breath or  palpitations. He has not had any recent GI symptoms and specifically has had  no reflux and has no history of gallbladder disease or peptic ulcer disease.   PAST MEDICAL HISTORY:  1. Hypothyroidism treated with Synthroid.  2. He denies any history of diabetes, hypertension or hyperlipidemia.   CURRENT MEDICATIONS:  1. Celebrex 200 mg q.d.  2. Synthroid 112 micrograms q.d.  3. Meclizine p.r.   SOCIAL HISTORY:  He lives in [place] with his wife. He works as a Teacher, music for [employer] He does not smoke.   FAMILY HISTORY:  Negative for cardiac disease.   For details of social history, family history and review of systems, please  see P.A. Rhonda Gardner's note.   PHYSICAL EXAMINATION:  VITAL SIGNS:  Blood pressure 138/88, pulse 110 and  regular.  NECK:  No venous distention. The carotid pulses were full without bruits.  CHEST:  Clear without rales or rhonchi.  CARDIAC:  The apex was quite. The 1st and 2nd heart sounds  were normal.  There were no murmurs or gallops.  ABDOMEN:  Soft without hepatosplenomegaly or pulsatile masses. Normal bowel  sounds.  EXTREMITIES:  Showed no edema and the pedal pulses were equal.  MUSCULOSKELETAL:  No deformities.  NEUROLOGIC:  No focal signs.  SKIN:  Warm and dry.   LABORATORY DATA:  Initial laboratory studies showed: Hemoglobin 18,  hematocrit 52, WBC count 10.1. BUN 29, creatinine 1.2. Initial CKs and  troponins were negative.   His EKG was normal.   IMPRESSION:  Chest pain, nausea and dizziness of uncertain etiology, rule  out acute coronary syndrome.   RECOMMENDATIONS:  I am concerned that his symptoms could represent acute  coronary syndrome, since it seems unusual that he should  have a sudden  onset of either reflux or gallbladder problems or GI problems without any  other systemic evidence of an infection and enteritis. On the other hand he  does not have any risk factors for coronary heart disease and his ECG is  normal. Will plan to admit him for observation, get serial EKGs and enzymes  as well as GI studies. Will plan evaluation with a Cardiolite scan tomorrow  if his enzymes are  negative.                                               Charlies Constable, M.D. [facility]    BB/MEDQ  D:  01/03/2003  T:  01/03/2003  Job:  [id number]   cc:   Lonzo Cloud. Kriste Basque, M.D. [facility]

## 2011-06-09 ENCOUNTER — Other Ambulatory Visit: Payer: Self-pay | Admitting: Pulmonary Disease

## 2011-06-16 ENCOUNTER — Other Ambulatory Visit: Payer: Self-pay | Admitting: *Deleted

## 2011-06-16 MED ORDER — CLORAZEPATE DIPOTASSIUM 7.5 MG PO TABS: 7.5000 mg | ORAL_TABLET | Freq: Three times a day (TID) | ORAL | Status: DC | PRN

## 2011-06-30 ENCOUNTER — Telehealth: Payer: Self-pay | Admitting: Pulmonary Disease

## 2011-06-30 DIAGNOSIS — E7800 Pure hypercholesterolemia, unspecified: Secondary | ICD-10-CM

## 2011-06-30 NOTE — Telephone Encounter (Signed)
Per phone note from 04-30-11 the pt was to try off of simvastatin for 3 months and then have lipid rechecked. Pt stopped med on 03-24-11. Order placed for lipid profile. Pt aware.Carron Curie, CMA

## 2011-07-01 ENCOUNTER — Other Ambulatory Visit (INDEPENDENT_AMBULATORY_CARE_PROVIDER_SITE_OTHER): Payer: Medicare Other

## 2011-07-01 DIAGNOSIS — E7800 Pure hypercholesterolemia, unspecified: Secondary | ICD-10-CM

## 2011-07-01 LAB — LIPID PANEL
LDL Cholesterol: 119 mg/dL — ABNORMAL HIGH (ref 0–99)
Total CHOL/HDL Ratio: 5
Triglycerides: 77 mg/dL (ref 0.0–149.0)

## 2011-07-02 ENCOUNTER — Other Ambulatory Visit: Payer: Self-pay | Admitting: Pulmonary Disease

## 2011-07-06 ENCOUNTER — Encounter: Payer: Self-pay | Admitting: Adult Health

## 2011-07-06 ENCOUNTER — Ambulatory Visit (INDEPENDENT_AMBULATORY_CARE_PROVIDER_SITE_OTHER): Payer: Medicare Other | Admitting: Adult Health

## 2011-07-06 DIAGNOSIS — J40 Bronchitis, not specified as acute or chronic: Secondary | ICD-10-CM

## 2011-07-06 MED ORDER — AMOXICILLIN-POT CLAVULANATE 875-125 MG PO TABS: 1.0000 | ORAL_TABLET | Freq: Two times a day (BID) | ORAL | Status: AC

## 2011-07-06 NOTE — Patient Instructions (Signed)
Augmentin 875mg  Twice daily  For 7 days with food  Mucinex DM Twice daily  As needed  Cough/congestion  Fluids and rest  Avoid extreme heat  Saline nasal rinses and gel As needed   Please contact office for sooner follow up if symptoms do not improve or worsen or seek emergency care

## 2011-07-07 ENCOUNTER — Other Ambulatory Visit: Payer: Self-pay | Admitting: Pulmonary Disease

## 2011-07-07 DIAGNOSIS — E7800 Pure hypercholesterolemia, unspecified: Secondary | ICD-10-CM

## 2011-07-08 NOTE — Progress Notes (Signed)
Subjective:    Patient ID: Gregory [name], male    DOB: 07-15-1941, 70 y.o.   MRN: [id number]  HPI  70 y/o WM here for a follow up visit... he has multiple medical problems including:  Hx OWR syndrome;  Hx atypCP;  Hyperchol;  Hypothyroidism;  GERD/ Divertics/ IBS/ Colon polyps;  BPH/ BOO;  DJD/ LBP/ Plantar fasciitis;  Hx dizziness;  Anxiety & chronic persistant insomnia...  ~  September 22, 2010:  he has several complaints today- musc cramps, dry throat & cough (mostly due to chr nasal prob & mouth breathing), dizzy spells (mostly when bending over w/ valsalva)... we discussed humidification, saline mist, MucinexDM, & physiology of the valsalva to avoid this problem... he denies interval bleeding, CP, SOB, etc... he would like the Flu shot, Rx for Shingles vaccine, & refill of meds.  ~  March 23, 2011:  6 mo [name] & his CC is leg pain/ nocturnal leg cramps x several months & pretty severe> he has scanned the internet & his questions were answered to the bewst of my ability;  We discussed avail remedies> Tonic water, Mustard, bar of soap in the bed, & Rx written for MagOx 400mg Qhs;  He also wants to stop the Simvastatin 40mg  for 39mo & see (he will call & let me know)...    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    Hx CP>  Prev cardiac w/u by DrCrenshaw was neg, no recurrent CP complaints, he remains active, etc...    Chol>  On Simva40 but wants to stop for 39mo due to the nocturnal leg pain he is havingFLP on med today shows TChol 104, TG 62, HDL 29, LDL 63...    Hypothy>  On Synthroid 14mcg/d>  Labs reveal TSH= 0.33 & review of flow sheet shows borderline over suppressed so we decided to decr his dose sl to 154mcg/d...    GI>  Followed by [facility] & had colonoscopy 3/12 at [facility]- neg x for hems & DrM rec f/u 69yrs...    GU>  Hx BPH w/ BOO &  followed by [id number] on Rapaflo 8ng/d;  PSA done today = 1.22...    Ortho>  eval 12/11 by DrNorris for bilat leg pain & cramping, ?min atrophy of left gastroc- he rec tonic water & neurology eval> he saw DrWillis 3/12 (note reviewed)> NCV was normal; EMG c/w mild S1 radiculopathy; he rec MRI- pending & to consider Zanaflex or Baclofen Rx...    Anxiety/ Insomnia>  He has Chlorazepate for Prn use, and Ambien to use for sleep Qhs...     Health Maintenance: ~  GI:  followed by [facility] & up to date on colon etc... (last 1/07 & 41yr f/u due 1/12). ~  GU:  followed by DrRDavis, and he did his PSA this yr. ~  Immunizations:  he getsyearly Flu vaccinations in the fall;  OK Pneumovax 03/12/10 at age74 1...   ~07/06/11 Acute OV  Pt presents for an acute office viisit. Complains of Cough x 2 wks- mainly dry and hacky "feel tickle in throat". He is concerned because family is leaving for [place] next week. He does not want to be sick.  NO hemoptysis. Continues with his chronic nasal symptoms -discussed saline nasal gel to help with severe dryness And nose bleeds. (hx of osler-weber).  No fever or chest pain .  Past Medical History  Diagnosis Date  . Other diseases of nasal cavity and sinuses   . Bronchitis, not specified as acute or chronic   . Hereditary hemorrhagic telangiectasia   . Pure hypercholesterolemia   . Unspecified hypothyroidism   . Esophageal reflux   . Irritable bowel syndrome   . Benign neoplasm of colon   . Hypertrophy of prostate with urinary obstruction and other lower urinary tract symptoms (LUTS)   . Lumbago   . Plantar fascial fibromatosis   . Dizziness and giddiness   . Anxiety state, unspecified   . Persistent disorder of initiating or maintaining sleep   . Squamous cell carcinoma of skin     Past Surgical History  Procedure Date  . Varicocelectomy 1991    Dr. Monico Blitz  . 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 Other  surgical history     pulse laser for facial telangiectasias inthe past    Outpatient Encounter Prescriptions as of 07/06/2011  Medication Sig Dispense Refill  . clidinium-chlordiazePOXIDE (LIBRAX) 2.5-5 MG per capsule TAKE ONE CAPSULE BY MOUTH 3 TIMES A DAY AS NEEDED FOR ABDOMINAL CRAMPING  90 capsule  2  . clorazepate (TRANXENE) 7.5 MG tablet Take 1 tablet (7.5 mg total) by mouth 3 (three) times daily as needed.  90 tablet  1  . levothyroxine (SYNTHROID, LEVOTHROID) 112 MCG tablet Take 1 tablet (112 mcg total) by mouth daily.  30 tablet  11  . loratadine (CLARITIN) 10 MG tablet Take 1 tablet (10 mg total) by mouth daily. As needed for allergies   30 tablet  2  . meclizine (ANTIVERT) 25 MG tablet Take 1/2 tablet to 1 whole tablet by mouth 4 times daily as directed for dizziness       . meloxicam (MOBIC) 7.5 MG tablet Take 7.5 mg by mouth daily as needed.        . metaxalone (SKELAXIN) 800 MG tablet Take 800 mg by mouth 3 (three) times daily as needed.        . Multiple Vitamins-Minerals (CENTRUM) tablet Take 1 tablet by mouth daily.        . pantoprazole (PROTONIX) 40 MG tablet TAKE 1 TABLET BY MOUTH ONCE A DAY AS NEEDED  30 tablet  5  . Silodosin (RAPAFLO) 8 MG CAPS Take 1 capsule by mouth daily.        [name] zolpidem (AMBIEN) 10 MG tablet Take 10 mg by mouth at bedtime as needed.        [name] amoxicillin-clavulanate (AUGMENTIN) 875-125 MG per tablet Take 1 tablet by mouth 2 (two) times daily.  14 tablet  0  . simvastatin (ZOCOR) 40 MG tablet Take 1 tablet (40 mg total) by mouth at bedtime. *HOLD THIS MED*  30 tablet  5    Allergies  Allergen Reactions  . Zocor (Simvastatin - High Dose)     Leg cramps      Review of Systems        See HPI - other systems neg except as noted...  The patient denies anorexia, fever, weight loss, weight gain, vision loss, decreased hearing, hoarseness, chest pain, syncope, dyspnea on exertion, peripheral edema, prolonged cough, headaches, hemoptysis, abdominal  pain, melena, hematochezia, severe indigestion/heartburn, hematuria, incontinence, muscle weakness, suspicious skin lesions, transient blindness, difficulty walking, depression, unusual weight change, abnormal bleeding, enlarged lymph nodes, and angioedema.     Objective:   Physical Exam  WD, WN, 70 y/o WM in NAD... GENERAL:  Alert & oriented; pleasant & cooperative... HEENT:  [place]/AT, EOM-wnl, PERRLA, EACs-clear, TMs-wnl, NOSE- occluded left nares from skin grafts, MOUTH- mucosal telangiectasias.  NECK:  Supple w/ fairROM; no JVD; normal carotid impulses w/o bruits; no thyromegaly or nodules palpated; no lymphadenopathy. CHEST:  Clear to P & A; without wheezes/ rales/ or rhonchi. HEART:  Regular Rhythm; without murmurs/ rubs/ or gallops. ABDOMEN:  Soft & nontender; normal bowel sounds; no organomegaly or masses detected. EXT: without deformities, mild arthritic changes; no varicose veins/ venous insuffic/ or edema. NEURO:  CN's intact; motor testing normal; sensory testing normal; gait normal & balance OK. DERM:  mult telangiectasias...   Assessment & Plan:

## 2011-07-08 NOTE — Assessment & Plan Note (Signed)
Flare   Plan:  Augmentin 875mg  Twice daily  For 7 days with food  Mucinex DM Twice daily  As needed  Cough/congestion  Fluids and rest  Avoid extreme heat  Saline nasal rinses and gel As needed   Please contact office for sooner follow up if symptoms do not improve or worsen or seek emergency care

## 2011-07-12 ENCOUNTER — Ambulatory Visit: Payer: Medicare Other

## 2011-07-22 ENCOUNTER — Ambulatory Visit (INDEPENDENT_AMBULATORY_CARE_PROVIDER_SITE_OTHER): Payer: Medicare Other

## 2011-07-22 VITALS — BP 115/60 | Wt 192.5 lb

## 2011-07-22 DIAGNOSIS — E7800 Pure hypercholesterolemia, unspecified: Secondary | ICD-10-CM

## 2011-07-22 MED ORDER — ROSUVASTATIN CALCIUM 5 MG PO TABS: 5.0000 mg | ORAL_TABLET | Freq: Every day | ORAL | Status: DC

## 2011-07-22 NOTE — Assessment & Plan Note (Addendum)
Assessment: [name] is a 75yoM with hyperlipidemia slightly above goal. Most recent labs:TChol 168, TG 77, HDL 34, LDL 119. (Goal TChol <200, TG <150, HDL >40, LDL <100). Gregory [name] diet is controlled but he could benefit from increasing his exercise. While he experienced leg cramps on simvastatin, he may benefit from switching to a different statin; he had previously been on Crestor without any leg cramping so we will have him try this medication again.  Plan: 1) Restart Crestor 5mg  by mouth daily. 2) Increase exercise to 20-79min 4-5x/week. 3) Follow-up in 3 months (11/04/11).

## 2011-07-22 NOTE — Progress Notes (Signed)
  Gregory [name] is a 42yoM presenting for an initial visit at [facility]. Overall he had no complaints or concerns. He had previously been taking Simvastatin 40 mg but discontinued taking the medication in April 2012 due to leg cramps which has improved since discontinuation; he had also previously been on Crestor 10mg  daily in 2010 and reported not having any muscle pains at that time but discontinued the medication due to cost.  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 He had previously worked at [employer] for 5 years, where he was more active physically. Gregory [name] walks on the treadmill ~2x/week for 20-30 minutes at a 2. pace. He enjoys the [employer].  Diet: Breakfast normally consists of Raisin Bran cereal with skim milk and a glass of OJ. For lunch he may have a sandwich/salad and potato chips/chocolate chip cookies. His wife cooks dinner, usually meat with 2 veggies. He enjoys chicken and country-style steak and his favorite veggies are salad, broccoli, cabbage, potatoes. He usually drinks unsweetened tea, coke (occasionally diet), and water. For snacks, he generally has mixed nuts or chocolate chip cookies, but denies eating a lot of sweets.   Current Outpatient Prescriptions on File Prior to Visit  Medication Sig Dispense Refill  . clidinium-chlordiazePOXIDE (LIBRAX) 2.5-5 MG per capsule TAKE ONE CAPSULE BY MOUTH 3 TIMES A DAY AS NEEDED FOR ABDOMINAL CRAMPING  90 capsule  2  . clorazepate (TRANXENE) 7.5 MG tablet Take 1 tablet (7.5 mg total) by mouth 3 (three) times daily as needed.  90 tablet  1  . levothyroxine (SYNTHROID, LEVOTHROID) 112 MCG tablet Take 1 tablet (112 mcg total) by mouth daily.  30 tablet  11  . loratadine (CLARITIN) 10 MG tablet Take 1 tablet (10 mg total) by mouth daily. As needed for allergies   30 tablet  2  . meclizine (ANTIVERT) 25 MG tablet Take 1/2 tablet to 1 whole  tablet by mouth 4 times daily as directed for dizziness       . meloxicam (MOBIC) 7.5 MG tablet Take 7.5 mg by mouth daily as needed.        . Multiple Vitamins-Minerals (CENTRUM) tablet Take 1 tablet by mouth daily.        . pantoprazole (PROTONIX) 40 MG tablet TAKE 1 TABLET BY MOUTH ONCE A DAY AS NEEDED  30 tablet  5  . Silodosin (RAPAFLO) 8 MG CAPS Take 1 capsule by mouth daily.        [name] zolpidem (AMBIEN) 10 MG tablet Take 10 mg by mouth at bedtime as needed.        . simvastatin (ZOCOR) 40 MG tablet Take 1 tablet (40 mg total) by mouth at bedtime. *HOLD THIS MED*  30 tablet  5   Allergies  Allergen Reactions  . Zocor (Simvastatin - High Dose)     Leg cramps

## 2011-07-22 NOTE — Patient Instructions (Addendum)
1) Start Crestor 5mg  by mouth daily. 2) Try to increase exercise for 20-30 minutes 4-5 days per week.  Follow-up in 3 months 11/04/11 at 8:30am

## 2011-09-20 LAB — DIFFERENTIAL
Eosinophils Absolute: 0
Eosinophils Relative: 0
Lymphocytes Relative: 12
Lymphs Abs: 1.5
Monocytes Absolute: 0.6
Monocytes Relative: 5

## 2011-09-20 LAB — COMPREHENSIVE METABOLIC PANEL
ALT: 16
AST: 18
Albumin: 3.7
CO2: 26
Calcium: 8.4
Creatinine, Ser: 1.03
GFR calc Af Amer: >60
Sodium: 141
Total Protein: 5.8 — ABNORMAL LOW

## 2011-09-20 LAB — URINALYSIS, ROUTINE W REFLEX MICROSCOPIC
Bilirubin Urine: NEGATIVE
Ketones, ur: NEGATIVE
Nitrite: NEGATIVE
Urobilinogen, UA: 0.2
pH: 5

## 2011-09-20 LAB — GLUCOSE, CAPILLARY: Glucose-Capillary: 113 — ABNORMAL HIGH

## 2011-09-20 LAB — CBC
Hemoglobin: 15.2
MCHC: 34.5
MCV: 92
RDW: 13.2

## 2011-09-21 ENCOUNTER — Ambulatory Visit (INDEPENDENT_AMBULATORY_CARE_PROVIDER_SITE_OTHER): Payer: Medicare Other | Admitting: Pulmonary Disease

## 2011-09-21 ENCOUNTER — Ambulatory Visit (INDEPENDENT_AMBULATORY_CARE_PROVIDER_SITE_OTHER)
Admission: RE | Admit: 2011-09-21 | Discharge: 2011-09-21 | Disposition: A | Discharge status: Home / Self Care | Payer: Medicare Other | Source: Ambulatory Visit | Attending: Pulmonary Disease | Admitting: Pulmonary Disease

## 2011-09-21 ENCOUNTER — Encounter: Payer: Self-pay | Admitting: Pulmonary Disease

## 2011-09-21 VITALS — BP 116/68 | HR 60 | Temp 96.9°F | Ht 70.0 in | Wt 188.8 lb

## 2011-09-21 DIAGNOSIS — M79609 Pain in unspecified limb: Secondary | ICD-10-CM

## 2011-09-21 DIAGNOSIS — M545 Low back pain, unspecified: Secondary | ICD-10-CM

## 2011-09-21 DIAGNOSIS — J3489 Other specified disorders of nose and nasal sinuses: Secondary | ICD-10-CM

## 2011-09-21 DIAGNOSIS — Z23 Encounter for immunization: Secondary | ICD-10-CM

## 2011-09-21 DIAGNOSIS — E7800 Pure hypercholesterolemia, unspecified: Secondary | ICD-10-CM

## 2011-09-21 DIAGNOSIS — R05 Cough: Secondary | ICD-10-CM

## 2011-09-21 DIAGNOSIS — M79601 Pain in right arm: Secondary | ICD-10-CM

## 2011-09-21 DIAGNOSIS — R059 Cough, unspecified: Secondary | ICD-10-CM

## 2011-09-21 DIAGNOSIS — I780 Hereditary hemorrhagic telangiectasia: Secondary | ICD-10-CM

## 2011-09-21 DIAGNOSIS — G4700 Insomnia, unspecified: Secondary | ICD-10-CM

## 2011-09-21 MED ORDER — SILODOSIN 8 MG PO CAPS: 8.0000 mg | ORAL_CAPSULE | Freq: Every day | ORAL | Status: DC

## 2011-09-21 MED ORDER — MELOXICAM 7.5 MG PO TABS: 7.5000 mg | ORAL_TABLET | Freq: Every day | ORAL | Status: DC | PRN

## 2011-09-21 MED ORDER — FIRST-DUKES MOUTHWASH MT SUSP: OROMUCOSAL | Status: DC

## 2011-09-21 MED ORDER — ZOLPIDEM TARTRATE 10 MG PO TABS: 10.0000 mg | ORAL_TABLET | Freq: Every evening | ORAL | Status: DC | PRN

## 2011-09-21 MED ORDER — CLORAZEPATE DIPOTASSIUM 7.5 MG PO TABS: 7.5000 mg | ORAL_TABLET | Freq: Three times a day (TID) | ORAL | Status: DC | PRN

## 2011-09-21 MED ORDER — MECLIZINE HCL 25 MG PO TABS: ORAL_TABLET | ORAL | Status: DC

## 2011-09-21 NOTE — Progress Notes (Signed)
Subjective:    Patient ID: Gregory [name], male    DOB: [birth year], 70 y.o.   MRN: [id number]  HPI 70 y/o WM here for a follow up visit... he has multiple medical problems including:  Hx OWR syndrome;  Hx atypCP;  Hyperchol;  Hypothyroidism;  GERD/ Divertics/ IBS/ Colon polyps;  BPH/ BOO;  DJD/ LBP/ Plantar fasciitis;  Hx dizziness;  Anxiety & chronic persistant insomnia...  ~  September 22, 2010:  he has several complaints today- musc cramps, dry throat & cough (mostly due to chr nasal prob & mouth breathing), dizzy spells (mostly when bending over w/ valsalva)... we discussed humidification, saline mist, MucinexDM, & physiology of the valsalva to avoid this problem... he denies interval bleeding, CP, SOB, etc... he would like the Flu shot, Rx for Shingles vaccine, & refill of meds.  ~  March 23, 2011:  6 mo [name] & his CC is leg pain/ nocturnal leg cramps x several months & pretty severe> he has scanned the internet & his questions were answered to the best of my ability;  We discussed avail remedies> Tonic water, Mustard, bar of soap in the bed, & Rx written for MagOx 400mg Qhs;  He also wants to stop the Simvastatin 40mg  for 30mo & see (he will call & let me know) >> he reports symptoms resolved off Simva.    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    Hx CP>  Prev cardiac w/u by DrCrenshaw was neg, no recurrent CP complaints, he remains active, etc...    Chol>  On Simva40 but wants to stop for 30mo due to the nocturnal leg pain he is having; FLP on med today shows TChol 104, TG 62, HDL 29, LDL 63...    Hypothy>  On Synthroid 119mcg/d>  Labs reveal TSH= 0.33 & review of flow sheet shows borderline over suppressed so we decided to decr his dose sl to 141mcg/d...    GI>  Followed by [facility] & had colonoscopy 3/12 at [facility]- neg x for hems & DrM rec  f/u 30yrs...    GU>  Hx BPH w/ BOO & followed by MVHQIONG on Rapaflo 8ng/d;  PSA done today = 1.22...    Ortho>  eval 12/11 by DrNorris for bilat leg pain & cramping, ?min atrophy of left gastroc- he rec tonic water & neurology eval> he saw DrWillis 3/12 (note reviewed)> NCV was normal; EMG c/w mild S1 radiculopathy; he rec MRI- pending & to consider Zanaflex or Baclofen Rx...    Anxiety/ Insomnia>  He has Chlorazepate for Prn use, and Ambien to use for sleep Qhs...  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    Hx CP>  Prev cardiac w/u by DrCrenshaw was neg, no recurrent CP complaints,  he remains active, etc...    Chol>  On Crestor5 now; he stopped Simva40 due to muscle cramps which improved/ resolved off the med; went to Lipid clinic & they tried Cres5- tol well so far!  FLP today shows TChol 114, TG 40, HDL 36, LDL 70, and LFTs are normal...    Hypothy>  On Synthroid 169mcg/d now>  Labs 4/12 revealed TSH= 0.33 on Synthroid125 so we decr the dose slightly to 112.[name]    GI>  Followed by [facility] & had colonoscopy 3/12 at [facility]- neg x for hems & DrM rec f/u 62yrs...    GU>  Hx BPH w/ BOO & followed by ZOXWRUEA on Rapaflo 8mg /d;  PSA done 4/12 = 1.22...    Ortho>  eval 12/11 by DrNorris for bilat leg pain & cramping, ?min atrophy of left gastroc- he rec tonic water & neurology eval> he saw DrWillis 3/12 (note reviewed)> NCV was normal; EMG c/w mild S1  radiculopathy; he rec MRI- pending (we did not get f/u note);  Now c/o some right arm/ shoulder symptoms & I have rec eval by DrSypher to sort this out...    Anxiety/ Insomnia>  He has Chlorazepate for Prn use, and Ambien to use for sleep Qhs...          Problem List:   OTHER DISEASES OF NASAL CAVITY AND SINUSES - Hx of Osler-Weber-Rendue syndrome/ hereditary telangiectasias... he's had numerous nose bleeds and surgery by Mikael Spray, now followed by Gayla Medicus... left nares is occluded w/ skin graft- no lumen... this is quite uncomfortable to the patient but he's been told there is nothing further that can be done for this... ~  3/11:  notes occas nosebleeds but he is able to handle them...  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 known GI telangiectasia as well without signif GI bleeding in the past- followed by [facility]... he's also has skin telangiectasis w/ prev laser therapy at [facility]... extensive nasal problems as above... ~  labs 2/09 showed Hg= 17.0 ~  labs 8/09 showed Hg= 16.5 ~  labs 2/10 showed Hg= 16.4 ~  labs 9/10 showed Hg= 17.3 ~  CXR 3/11 is chr incr markings esp RLL- no change, NAD.[name].  ~  CXR 10/12 showed mild biapical scarring, RLL AVM, NAD...  Hx of CHEST PAIN (ICD-786.50) - seen Sep09 w/ several recent bouts of CP while working in yard, washing car, etc... resolved w/ rest & Protonix... EKG showed WNL, prev NuclearStressTest 1/04 at [facility] was neg (no ischemia or infarction and EF= 61%)... Cardiac eval DrCrenshaw w/ 2DEcho 10/09 showing norm LV- no regional wall motion abn, norm LVF w/ EF= 60%, mild dil RA/ RV;  and norm MYOVIEW (no ischemia or infarction, EF= 58%)...  HYPERCHOLESTEROLEMIA, MILD (ICD-272.0) - now on CRESTOR 5mg /d>  Prev Rx w/ Simva40 but developed muscle cramping which resolved off the med. ~  FLP 6/08 showed TChol 208, TG 75, HDL 34, LDL  139... he preferred diet Rx- ~  FLP 2/09 showed TChol 172, TG 69, HDL 30, LDL 129... he agreed to Crestor10... ~  FLP 8/09 on Crestor10 showed TChol 91, TG 49, HDL 28, LDL 54... rec- keep same. ~  FLP 2/10 on Crestor10 showed TChol 108, TG 56, HDL 31, LDL 66 ~  9/10:  we discussed changing Crestor10 to Simvastatin40 for $$$ reasons... ~  FLP 3/11 on Simva40 showed TChol 105, TG 59,  HDL 39, LDL 54... continue same. ~  FLP 4/12 on Simva40 showed TChol 104, TG 62, HDL 29, LDL 63... C/o severe muscle cramps & wants to stop for 47mo> cramping resolved! ~  Referred to Lipid Clinic & they restarted his Crestor at 5mg /d> tol well so far... ~  FLP 10/12 on Cres5 showed TChol 114, TG 40, HDL 36, LDL 70  HYPOTHYROIDISM - on SYNTHROID 169mcg/d now; prev 131mcg/d dose was decreased 4/12... ~  labs 2/09 showed TSH= 0.69...  8/09 showed TSH= 0.44... ~  labs 2/10 showed TSH= 0.68 ~  labs 3/11 showed TSH= 0.52 ~  Labs 4/12 on Synthroid125 showed TSH= 0.33... We decided to decr the dose to 112/d.  GERD, IRRITABLE BOWEL SYNDROME, COLONIC POLYPS - followed by [facility]... prev on Protonix, he changed to OTC Zantac 75mg  on his own... also takes  LIBRAX Prn... colonoscopy 1/04 w/ divertics, diminutive rectal polyp (adenomatous), hems... f/u colon 1/07 showed no recurrent polyps... ~  colonoscopy 3/12 at [facility]- neg x for hems & [facility] rec f/u 68yrs...  HYPERTROPHY PROSTATE W/UR OBST & OTH LUTS (ICD-600.01) - followed by NFAOZHYQ on RAPAFLO 8mg /d (stopped Flomax due to side effect- ED)... doing well without LTOS, just complains of nocturia x 1-2 ~  labs 2/09 showed PSA= 0.76 ~  labs 2/10 showed PSA= 0.90 ~  2011 PSA checked by DrDavis (pt states it was OK)...  LEFT ARM & SHOULDER PAIN >> he was checked by Alinda Sierras but not improving he says; we discussed second opinion at the [facility], DrSypher...  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 ~   9/10:  requesting Rx for Robaxin for muscle spasm as needed.  PLANTAR FASCIITIS (ICD-728.71) - c/o pain in his feet secondary to plantar fasciitis eval & Rx from [facility] w/ Celebrex & shots... ~  9/10: we discussed change to [facility] - symptoms improved.  Hx of DIZZINESS (ICD-780.4) - sudden episode dizziness Sep09 working at [employer] around [employer] w/ room spinning- lasted and resolved spontaneously, felt light headed afterwards... no LOC, syncope, seizure activity, etc... no CP/ palpit/ SOB/ etc... there were several EMTs in the restaurant and they checked him out- stable and transported to ER... exam was neg-  they did labs: all norm, didn't do scans (can't get MRI due to metallic clamp in sinus), he can't take ASA due to bleeding from his OWR.[name]. treated w/ MECLIZINE w/ improvement.  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  Health Maintenance: ~  GI:  followed by [facility] & up to date on colon etc... (last 1/07 & 49yr f/u due 1/12). ~  GU:  followed by DrRDavis, and he did his PSA this yr. ~  Immunizations:  he getsyearly Flu vaccinations in the fall;  OK Pneumovax 03/12/10 at age[age];     Past Surgical History  Procedure Date  . Varicocelectomy 1991    Dr. Monico Blitz  . 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 Other surgical history     pulse laser for facial telangiectasias inthe past    Outpatient Encounter Prescriptions as of 09/21/2011  Medication Sig Dispense Refill  . acetaminophen (TYLENOL) 500 MG tablet Take 500 mg by mouth as needed. Rotates with ibuprofen/naproxen. Does not take all  three at one time.       . clidinium-chlordiazePOXIDE (LIBRAX) 2.5-5 MG per capsule TAKE ONE CAPSULE BY MOUTH 3 TIMES A DAY AS NEEDED FOR ABDOMINAL CRAMPING  90 capsule  2  .  clorazepate (TRANXENE) 7.5 MG tablet Take 1 tablet (7.5 mg total) by mouth 3 (three) times daily as needed.  90 tablet  1  . levothyroxine (SYNTHROID, LEVOTHROID) 112 MCG tablet Take 1 tablet (112 mcg total) by mouth daily.  30 tablet  11  . loratadine (CLARITIN) 10 MG tablet Take 1 tablet (10 mg total) by mouth daily. As needed for allergies   30 tablet  2  . meclizine (ANTIVERT) 25 MG tablet Take 1/2 tablet to 1 whole tablet by mouth 4 times daily as directed for dizziness       . meloxicam (MOBIC) 7.5 MG tablet Take 7.5 mg by mouth daily as needed.        . Multiple Vitamins-Minerals (CENTRUM) tablet Take 1 tablet by mouth daily.        . naproxen sodium (ANAPROX) 220 MG tablet Take 220 mg by mouth as needed. Rotates with ibuprofen/acetaminophen. Does not take at same time as others.       . pantoprazole (PROTONIX) 40 MG tablet TAKE 1 TABLET BY MOUTH ONCE A DAY AS NEEDED  30 tablet  5  . rosuvastatin (CRESTOR) 5 MG tablet Take 1 tablet (5 mg total) by mouth daily.  30 tablet  11  . Silodosin (RAPAFLO) 8 MG CAPS Take 1 capsule by mouth daily.        [name] zolpidem (AMBIEN) 10 MG tablet Take 10 mg by mouth at bedtime as needed.        [name] DISCONTD: ibuprofen (ADVIL,MOTRIN) 200 MG tablet Take 200 mg by mouth as needed. Rotates with naproxen/acetaminophen. Does not take all 3 at one time.       [name] DISCONTD: simvastatin (ZOCOR) 40 MG tablet Take 1 tablet (40 mg total) by mouth at bedtime. *HOLD THIS MED*  30 tablet  5    Allergies  Allergen Reactions  . Zocor (Simvastatin - High Dose)     Leg cramps     Current Medications, Allergies, Past Medical History, Past Surgical History, Family History, and Social History were reviewed in [employer] record.    Review of Systems       See HPI - other systems neg except as noted... The patient denies anorexia, fever, weight loss, weight gain, vision loss, decreased hearing, hoarseness, chest pain, syncope, dyspnea on exertion,  peripheral edema, prolonged cough, headaches, hemoptysis, abdominal pain, melena, hematochezia, severe indigestion/heartburn, hematuria, incontinence, muscle weakness, suspicious skin lesions, transient blindness, difficulty walking, depression, unusual weight change, abnormal bleeding, enlarged lymph nodes, and angioedema.     Objective:   Physical Exam      WD, WN, 70 y/o WM in NAD... GENERAL:  Alert & oriented; pleasant & cooperative... HEENT:  [place]/AT, EOM-wnl, PERRLA, EACs-clear, TMs-wnl, NOSE- occluded left nares from skin grafts, MOUTH- mucosal telangiectasias.  NECK:  Supple w/ fairROM; no JVD; normal carotid impulses w/o bruits; no thyromegaly or nodules palpated; no lymphadenopathy. CHEST:  Clear to P & A; without wheezes/ rales/ or rhonchi. HEART:  Regular Rhythm; without murmurs/ rubs/ or gallops. ABDOMEN:  Soft & nontender; normal bowel sounds; no organomegaly or masses detected. EXT: without deformities, mild arthritic changes; no varicose veins/ venous insuffic/ or edema. NEURO:  CN's intact; motor testing normal; sensory testing normal; gait normal & balance OK.  DERM:  mult telangiectasias...   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 CXR w/o change in the RLL AVM.     Hx CP>  Prev cardiac w/u by DrCrenshaw was neg, no recurrent CP complaints, he remains active, etc...     Chol>  On Crestor5 now; he stopped Simva40 due to muscle cramps which improved/ resolved off the med; went to Lipid clinic & they tried Cres5- tol well so far!  FLP today shows TChol 114, TG 40, HDL 36, LDL 70, and LFTs are normal...     Hypothy>  On Synthroid 150mcg/d now>  Labs 4/12  revealed TSH= 0.33 on Synthroid125 so we decr the dose slightly to 112.[name]     GI>  Followed by [facility] & had colonoscopy 3/12 at [facility]- neg x for hems & DrM rec f/u 84yrs...     GU>  Hx BPH w/ BOO & followed by [id number] on Rapaflo 8mg /d;  PSA done 4/12 = 1.22...     Ortho>  eval 12/11 by DrNorris for bilat leg pain & cramping, ?min atrophy of left gastroc- he rec tonic water & neurology eval> he saw DrWillis 3/12 (note reviewed)> NCV was normal; EMG c/w mild S1 radiculopathy; he rec MRI- pending (we did not get f/u note);  Now c/o some right arm/ shoulder symptoms & I have rec eval by DrSypher to sort this out...     Anxiety/ Insomnia>  He has Chlorazepate for Prn use, and Ambien to use for sleep Qhs.Marland KitchenMarland Kitchen

## 2011-09-22 ENCOUNTER — Other Ambulatory Visit (INDEPENDENT_AMBULATORY_CARE_PROVIDER_SITE_OTHER): Payer: Medicare Other

## 2011-09-22 DIAGNOSIS — E7800 Pure hypercholesterolemia, unspecified: Secondary | ICD-10-CM

## 2011-09-22 LAB — HEPATIC FUNCTION PANEL
Alkaline Phosphatase: 57 U/L (ref 39–117)
Bilirubin, Direct: 0.1 mg/dL (ref 0.0–0.3)
Total Protein: 6.9 g/dL (ref 6.0–8.3)

## 2011-09-22 LAB — LIPID PANEL
LDL Cholesterol: 70 mg/dL (ref 0–99)
Total CHOL/HDL Ratio: 3
VLDL: 8 mg/dL (ref 0.0–40.0)

## 2011-09-23 ENCOUNTER — Ambulatory Visit: Payer: Medicare Other | Admitting: Pulmonary Disease

## 2011-09-23 ENCOUNTER — Encounter: Payer: Self-pay | Admitting: Pulmonary Disease

## 2011-09-23 NOTE — Patient Instructions (Signed)
Today we updated your med list in EPIC...    We reilled the meds you requested...  We decided to try for better throat moisture thru lozenges, gum, Magic Mouthwash, 7 fluids; let me know if the cough does not improve & we will rx w/ a cough suppressant...  Today we did your follow up CXR & fasting blood work...    Please call the [facility]REE in a few days for your results...    Dial [id number] & when prompted enter your patient number followed by the # symbol...    Your patient number is:   [id number]  Call for any questions...  Let's plan another follow up visit in 6 months.Marland KitchenMarland Kitchen

## 2011-10-25 ENCOUNTER — Other Ambulatory Visit: Payer: Self-pay | Admitting: Pulmonary Disease

## 2011-10-28 ENCOUNTER — Ambulatory Visit: Payer: Medicare Other

## 2011-11-04 ENCOUNTER — Ambulatory Visit (INDEPENDENT_AMBULATORY_CARE_PROVIDER_SITE_OTHER): Payer: Medicare Other

## 2011-11-04 VITALS — BP 98/60 | Wt 189.0 lb

## 2011-11-04 DIAGNOSIS — E7800 Pure hypercholesterolemia, unspecified: Secondary | ICD-10-CM

## 2011-11-04 NOTE — Assessment & Plan Note (Addendum)
Lipid values are as follows:  TC 114 (goal <200), TG 40 (goal <150), HDL 35.6 (goal >40), LDL 70 (goal <100), LFTs WNL.  Patient is at goal for all values, except HDL which is slightly low.  Patient maintains an appropriate diet and attempts to exercise.  He will work on increasing exercise by at least 15-20 minutes per week.  I hope this will continue to improve HDL.  Patient reports compliance with medications and is tolerating Crestor well.  Will continue Crestor 5 mg daily.  Will follow-up in 3 months given the holidays are approaching.  If lipids remain at goal at this time, will consider extending follow-up to q6 mo.    Plan: Continue crestor 5 mg daily Maintain healthy diet, limit portions during the holidays Continue exercising with goal of 150 minutes per week.   Follow-up in 3 months

## 2011-11-04 NOTE — Progress Notes (Signed)
Gregory [name] is a 70 year old male returning to lipid clinic for 3 month follow-up after starting Crestor 5 mg daily.  He denies any adverse events and reports that he is tolerating this medication well, no leg cramps or pains (as he experienced in the past with simvastatin).  He reports compliance with medications, rarely misses a dose.    Pt maintains a healthy diet and has continued to attempt to make healthy changes.  He has worked to decrease amount of sweets he is eating and has decreased amount of soda.  Also reports getting in at least 3-4 servings of fruits/vegetables per day.    Pt continues walking on the treadmill ~30 minutes, 3 days per week.  He does not walk at an incline, which I have recommended in order to burn more calories and further improve muscle tone.  Pt is pleased that he has lost ~7 lbs since last visit (per his home scale).   Patient does not use tobacco or alcohol.

## 2011-11-04 NOTE — Patient Instructions (Signed)
1)  Continue Crestor 5 mg daily 2)  Attempt to maintain healthy dietary choices, and watch portion sizes over the holidays! 3)  Attempt to increase exercise by 15-20 minutes each week, optimal goal of 150 min per week, may also try increasing the incline on treadmill 4)  Follow-up in 3 months   Lipid Clinic: Thursday, February 14th @ 8:30 am Labwork: Monday, Feb 11th @ 8:00 am (FASTING)

## 2011-11-08 ENCOUNTER — Telehealth: Payer: Self-pay | Admitting: Pulmonary Disease

## 2011-11-08 MED ORDER — CIPROFLOXACIN HCL 500 MG PO TABS: 500.0000 mg | ORAL_TABLET | Freq: Two times a day (BID) | ORAL | Status: AC

## 2011-11-08 NOTE — Telephone Encounter (Signed)
I spoke with pt and is aware of TP recs. Pt states he would like rx sent for cipro just in case he needs it.

## 2011-11-08 NOTE — Telephone Encounter (Signed)
Pt states due to his GI history and going to [place] next week, he is requesting Lomotil Rx to have just in case while there for a week. Pt currently is not c/o diarrhea. Please advise, thanks.   Allergies  Allergen Reactions  . Zocor (Simvastatin - High Dose)     Leg cramps   [facility]

## 2011-11-08 NOTE — Telephone Encounter (Signed)
Per TP---pt can use the immodium ad otc.  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  If so we can send in rx for cipro 500mg   #6  1 po bid for pt to hold if he needs this.  Thanks.

## 2011-12-09 ENCOUNTER — Telehealth: Payer: Self-pay | Admitting: Pulmonary Disease

## 2011-12-09 NOTE — Telephone Encounter (Signed)
I spoke with pt and he states he has been noticing some pain in the back of his head. Not sure what happened. Pt is scheduled to come in and see TP 12/10/11 at 12:00 for an evaluation. Pt states he felt fine to wait til tomorrow. I advised pt if he feels like he can't wait or the pain gets worse then to seek emergency care. He voiced his understanding

## 2011-12-10 ENCOUNTER — Encounter: Payer: Self-pay | Admitting: Adult Health

## 2011-12-10 ENCOUNTER — Ambulatory Visit (INDEPENDENT_AMBULATORY_CARE_PROVIDER_SITE_OTHER): Payer: Medicare Other | Admitting: Adult Health

## 2011-12-10 DIAGNOSIS — S139XXA Sprain of joints and ligaments of unspecified parts of neck, initial encounter: Secondary | ICD-10-CM

## 2011-12-10 DIAGNOSIS — S161XXA Strain of muscle, fascia and tendon at neck level, initial encounter: Secondary | ICD-10-CM

## 2011-12-10 NOTE — Patient Instructions (Signed)
Take Naproxen over next week daily with food and then As needed   Alternate ice and heat to neck As needed   Please contact office for sooner follow up if symptoms do not improve or worsen or seek emergency care

## 2011-12-10 NOTE — Progress Notes (Signed)
Subjective:    Patient ID: Gregory [name], male    DOB: 08-06-1941, 70 y.o.   MRN: [id number]  HPI 70 y/o WM  :  Hx OWR syndrome;  Hx atypCP;  Hyperchol;  Hypothyroidism;  GERD/ Divertics/ IBS/ Colon polyps;  BPH/ BOO;  DJD/ LBP/ Plantar fasciitis;  Hx dizziness;  Anxiety & chronic persistant insomnia...  ~  September 22, 2010:  he has several complaints today- musc cramps, dry throat & cough (mostly due to chr nasal prob & mouth breathing), dizzy spells (mostly when bending over w/ valsalva)... we discussed humidification, saline mist, MucinexDM, & physiology of the valsalva to avoid this problem... he denies interval bleeding, CP, SOB, etc... he would like the Flu shot, Rx for Shingles vaccine, & refill of meds.  ~  March 23, 2011:  6 mo [name] & his CC is leg pain/ nocturnal leg cramps x several months & pretty severe> he has scanned the internet & his questions were answered to the best of my ability;  We discussed avail remedies> Tonic water, Mustard, bar of soap in the bed, & Rx written for MagOx 400mg Qhs;  He also wants to stop the Simvastatin 40mg  for 52mo & see (he will call & let me know) >> he reports symptoms resolved off Simva.    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    Hx CP>  Prev cardiac w/u by DrCrenshaw was neg, no recurrent CP complaints, he remains active, etc...    Chol>  On Simva40 but wants to stop for 52mo due to the nocturnal leg pain he is having; FLP on med today shows TChol 104, TG 62, HDL 29, LDL 63...    Hypothy>  On Synthroid 146mcg/d>  Labs reveal TSH= 0.33 & review of flow sheet shows borderline over suppressed so we decided to decr his dose sl to 164mcg/d...    GI>  Followed by [facility] & had colonoscopy 3/12 at [facility]- neg x for hems & DrM rec f/u 3yrs...    GU>  Hx BPH w/ BOO & followed by [facility] on Rapaflo  8ng/d;  PSA done today = 1.22...    Ortho>  eval 12/11 by DrNorris for bilat leg pain & cramping, ?min atrophy of left gastroc- he rec tonic water & neurology eval> he saw DrWillis 3/12 (note reviewed)> NCV was normal; EMG c/w mild S1 radiculopathy; he rec MRI- pending & to consider Zanaflex or Baclofen Rx...    Anxiety/ Insomnia>  He has Chlorazepate for Prn use, and Ambien to use for sleep Qhs...  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    Hx CP>  Prev cardiac w/u by DrCrenshaw was neg, no recurrent CP complaints, he remains active, etc...    Chol>  On  Crestor5 now; he stopped Simva40 due to muscle cramps which improved/ resolved off the med; went to Lipid clinic & they tried Cres5- tol well so far!  FLP today shows TChol 114, TG 40, HDL 36, LDL 70, and LFTs are normal...    Hypothy>  On Synthroid 173mcg/d now>  Labs 4/12 revealed TSH= 0.33 on Synthroid125 so we decr the dose slightly to 112.[name]    GI>  Followed by [facility] & had colonoscopy 3/12 at [facility]- neg x for hems & DrM rec f/u 81yrs...    GU>  Hx BPH w/ BOO & followed by [id number] on Rapaflo 8mg /d;  PSA done 4/12 = 1.22...    Ortho>  eval 12/11 by DrNorris for bilat leg pain & cramping, ?min atrophy of left gastroc- he rec tonic water & neurology eval> he saw DrWillis 3/12 (note reviewed)> NCV was normal; EMG c/w mild S1 radiculopathy; he rec MRI- pending (we did not get f/u note);  Now c/o some  right arm/ shoulder symptoms & I have rec eval by DrSypher to sort this out...    Anxiety/ Insomnia>  He has Chlorazepate for Prn use, and Ambien to use for sleep Qhs...  12/10/2011 Acute OV  Patient presents today for a work in visit. He complains of one week of dullness and soreness in the back of his head . Along the mid left occipital scalp sore to touch. He denies any known injury or fall loss of consciousness. Denies dizziness, visual changes, arm weakness, speech or swallowing, difficulty. Has taken Tylenol without much relief. He does do a lot of computer work. Denies any rash.          Problem List:   OTHER DISEASES OF NASAL CAVITY AND SINUSES - Hx of Osler-Weber-Rendue syndrome/ hereditary telangiectasias... he's had numerous nose bleeds and surgery by Mikael Spray, now followed by Gayla Medicus... left nares is occluded w/ skin graft- no lumen... this is quite uncomfortable to the patient but he's been told there is nothing further that can be done for this... ~  3/11:  notes occas nosebleeds but he is able to handle them...  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 known GI telangiectasia as well without signif GI bleeding in the past- followed by [facility]... he's also has skin telangiectasis w/ prev laser therapy at [facility]... extensive nasal problems as above... ~  labs 2/09 showed Hg= 17.0 ~  labs 8/09 showed Hg= 16.5 ~  labs 2/10 showed Hg= 16.4 ~  labs 9/10 showed Hg= 17.3 ~  CXR 3/11 is chr incr markings esp RLL- no change, NAD.[name].  ~  CXR 10/12 showed mild biapical scarring, RLL AVM, NAD...  Hx of CHEST PAIN (ICD-786.50) - seen Sep09 w/ several recent bouts of CP while working in yard, washing car, etc... resolved w/ rest & Protonix... EKG showed WNL, prev NuclearStressTest 1/04 at [facility] was neg (no ischemia or infarction and EF= 61%)... Cardiac eval DrCrenshaw w/  2DEcho 10/09 showing norm LV- no regional wall motion abn, norm LVF w/ EF= 60%, mild dil RA/ RV;  and norm MYOVIEW (no ischemia or infarction, EF= 58%)...  HYPERCHOLESTEROLEMIA, MILD (ICD-272.0) - now on CRESTOR 5mg /d>  Prev Rx w/ Simva40 but developed muscle cramping which resolved off the med. ~  FLP 6/08 showed TChol 208, TG 75, HDL 34, LDL 139... he preferred diet Rx- ~  FLP 2/09 showed TChol 172, TG 69,  HDL 30, LDL 129... he agreed to Crestor10... ~  FLP 8/09 on Crestor10 showed TChol 91, TG 49, HDL 28, LDL 54... rec- keep same. ~  FLP 2/10 on Crestor10 showed TChol 108, TG 56, HDL 31, LDL 66 ~  9/10:  we discussed changing Crestor10 to Simvastatin40 for $$$ reasons... ~  FLP 3/11 on Simva40 showed TChol 105, TG 59, HDL 39, LDL 54... continue same. ~  FLP 4/12 on Simva40 showed TChol 104, TG 62, HDL 29, LDL 63... C/o severe muscle cramps & wants to stop for 85mo> cramping resolved! ~  Referred to Lipid Clinic & they restarted his Crestor at 5mg /d> tol well so far... ~  FLP 10/12 on Cres5 showed TChol 114, TG 40, HDL 36, LDL 70  HYPOTHYROIDISM - on SYNTHROID 153mcg/d now; prev 162mcg/d dose was decreased 4/12... ~  labs 2/09 showed TSH= 0.69...  8/09 showed TSH= 0.44... ~  labs 2/10 showed TSH= 0.68 ~  labs 3/11 showed TSH= 0.52 ~  Labs 4/12 on Synthroid125 showed TSH= 0.33... We decided to decr the dose to 112/d.  GERD, IRRITABLE BOWEL SYNDROME, COLONIC POLYPS - followed by [facility]... prev on Protonix, he changed to OTC Zantac 75mg  on his own... also takes  LIBRAX Prn... colonoscopy 1/04 w/ divertics, diminutive rectal polyp (adenomatous), hems... f/u colon 1/07 showed no recurrent polyps... ~  colonoscopy 3/12 at [facility]- neg x for hems & [facility] rec f/u 12yrs...  HYPERTROPHY PROSTATE W/UR OBST & OTH LUTS (ICD-600.01) - followed by [id number] on RAPAFLO 8mg /d (stopped Flomax due to side effect- ED)... doing well without LTOS, just complains of nocturia x 1-2 ~  labs 2/09 showed  PSA= 0.76 ~  labs 2/10 showed PSA= 0.90 ~  2011 PSA checked by DrDavis (pt states it was OK)...  LEFT ARM & SHOULDER PAIN >> he was checked by Alinda Sierras but not improving he says; we discussed second opinion at the [facility], DrSypher...  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 ~  9/10:  requesting Rx for Robaxin for muscle spasm as needed.  PLANTAR FASCIITIS (ICD-728.71) - c/o pain in his feet secondary to plantar fasciitis eval & Rx from [facility] w/ Celebrex & shots... ~  9/10: we discussed change to [facility] - symptoms improved.  Hx of DIZZINESS (ICD-780.4) - sudden episode dizziness Sep09 working at [employer] around [employer] w/ room spinning- lasted and resolved spontaneously, felt light headed afterwards... no LOC, syncope, seizure activity, etc... no CP/ palpit/ SOB/ etc... there were several EMTs in the restaurant and they checked him out- stable and transported to ER... exam was neg-  they did labs: all norm, didn't do scans (can't get MRI due to metallic clamp in sinus), he can't take ASA due to bleeding from his OWR.[name]. treated w/ MECLIZINE w/ improvement.  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  Health Maintenance: ~  GI:  followed by [facility] & up to date on colon etc... (last 1/07 & 26yr f/u due 1/12). ~  GU:  followed by DrRDavis, and he did his PSA this yr. ~  Immunizations:  he getsyearly Flu vaccinations in the fall;  OK Pneumovax 03/12/10 at age[age];     Past Surgical History  Procedure Date  . Varicocelectomy 1991    Dr. Monico Blitz  . 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 Other surgical history     pulse laser for facial telangiectasias inthe past    Outpatient Encounter Prescriptions as of  12/10/2011  Medication Sig Dispense Refill  . acetaminophen (TYLENOL) 500 MG tablet Take 500 mg by mouth as needed. Rotates with ibuprofen/naproxen. Does not take all three at one time.       . clidinium-chlordiazePOXIDE (LIBRAX) 2.5-5 MG per capsule TAKE ONE CAPSULE BY MOUTH 3 TIMES A DAY AS NEEDED FOR ABDOMINAL CRAMPING  90 capsule  2  . clorazepate (TRANXENE) 7.5 MG tablet Take 1 tablet (7.5 mg total) by mouth 3 (three) times daily as needed.  90 tablet  5  . Diphenhyd-Hydrocort-Nystatin (FIRST-DUKES MOUTHWASH) SUSP garlge and swallow 1 tsp four times daily as needed  120 mL  5  . levothyroxine (SYNTHROID, LEVOTHROID) 112 MCG tablet Take 1 tablet (112 mcg total) by mouth daily.  30 tablet  11  . loratadine (CLARITIN) 10 MG tablet Take 1 tablet (10 mg total) by mouth daily. As needed for allergies   30 tablet  2  . meclizine (ANTIVERT) 25 MG tablet Take 1/2 tablet to 1 whole tablet by mouth 4 times daily as directed for dizziness  90 tablet  11  . Multiple Vitamins-Minerals (CENTRUM) tablet Take 1 tablet by mouth daily.        . naproxen sodium (ANAPROX) 220 MG tablet Take 220 mg by mouth as needed. Rotates with ibuprofen/acetaminophen. Does not take at same time as others.       . pantoprazole (PROTONIX) 40 MG tablet TAKE 1 TABLET BY MOUTH ONCE A DAY AS NEEDED  30 tablet  5  . silodosin (RAPAFLO) 8 MG CAPS capsule Take 1 capsule (8 mg total) by mouth daily.  30 capsule  11  . zolpidem (AMBIEN) 10 MG tablet Take 1 tablet (10 mg total) by mouth at bedtime as needed.  30 tablet  5  . meloxicam (MOBIC) 7.5 MG tablet TAKE 1 TABLET BY MOUTH EVERY DAY AS NEEDED FOR ARTHRITIS PAIN  30 tablet  4  . rosuvastatin (CRESTOR) 5 MG tablet Take 1 tablet (5 mg total) by mouth daily.  30 tablet  11    Allergies  Allergen Reactions  . Zocor (Simvastatin - High Dose)     Leg cramps     Current Medications, Allergies, Past Medical History, Past Surgical History, Family History, and Social History were  reviewed in [employer] record.    Review of Systems       Constitutional:   No  weight loss, night sweats,  Fevers, chills, fatigue, or  lassitude.  HEENT:    Difficulty swallowing,  Tooth/dental problems, or  Sore throat,                No sneezing, itching, ear ache, nasal congestion, post nasal drip,   CV:  No chest pain,  Orthopnea, PND, swelling in lower extremities, anasarca, dizziness, palpitations, syncope.   GI  No heartburn, indigestion, abdominal pain, nausea, vomiting, diarrhea, change in bowel habits, loss of appetite, bloody stools.   Resp: No shortness of breath with exertion or at rest.  No excess mucus, no productive cough,  No non-productive cough,  No coughing up of blood.  No change in color of mucus.  No wheezing.  No chest wall deformity  Skin: no rash or lesions.  GU: no dysuria, change in color of urine, no  urgency or frequency.  No flank pain, no hematuria   MS:  No joint pain or swelling.  No decreased range of motion.  No back pain.  Psych:  No change in mood or affect. No depression or anxiety.  No memory loss.       Objective:   Physical Exam      WD, WN, 70 y/o WM in NAD... GENERAL:  Alert & oriented; pleasant & cooperative... HEENT:  /AT, EOM-wnl, PERRLA, EACs-clear, TMs-wnl, NOSE- occluded left nares from skin grafts, MOUTH- mucosal telangiectasias.  NECK:  Supple w/ fairROM; no JVD; normal carotid impulses w/o bruits; no thyromegaly or nodules palpated; no lymphadenopathy. CHEST:  Clear to P & A; without wheezes/ rales/ or rhonchi. HEART:  Regular Rhythm; without murmurs/ rubs/ or gallops. ABDOMEN:  Soft & nontender; normal bowel sounds; no organomegaly or masses detected. EXT: without deformities, mild arthritic changes; no varicose veins/ venous insuffic/ or edema. Tender along bilateral neck and upper shoulders, tender along left mid post scalp. No obvious brusiing. No rash noted.  NEURO:  CN's intact; motor  testing normal; sensory testing normal; gait normal & balance OK. Equal strength with nml grips bilaterally  DERM:  mult telangiectasias...   Assessment & Plan:

## 2011-12-10 NOTE — Assessment & Plan Note (Signed)
Suspected neck strain   Plan:  Naproxen over next week daily with food and then As needed   Alternate ice and heat to neck As needed   Please contact office for sooner follow up if symptoms do not improve or worsen or seek emergency care

## 2012-01-31 ENCOUNTER — Other Ambulatory Visit: Payer: Medicare Other

## 2012-02-03 ENCOUNTER — Ambulatory Visit: Payer: Medicare Other

## 2012-02-04 ENCOUNTER — Other Ambulatory Visit: Payer: Medicare Other

## 2012-02-04 ENCOUNTER — Other Ambulatory Visit (INDEPENDENT_AMBULATORY_CARE_PROVIDER_SITE_OTHER): Payer: Medicare Other

## 2012-02-04 ENCOUNTER — Encounter: Payer: Self-pay | Admitting: Pharmacist

## 2012-02-04 ENCOUNTER — Other Ambulatory Visit: Payer: Self-pay | Admitting: Pharmacist

## 2012-02-04 DIAGNOSIS — Z79899 Other long term (current) drug therapy: Secondary | ICD-10-CM

## 2012-02-04 DIAGNOSIS — E7800 Pure hypercholesterolemia, unspecified: Secondary | ICD-10-CM

## 2012-02-04 DIAGNOSIS — E785 Hyperlipidemia, unspecified: Secondary | ICD-10-CM

## 2012-02-04 DIAGNOSIS — I780 Hereditary hemorrhagic telangiectasia: Secondary | ICD-10-CM

## 2012-02-04 LAB — LIPID PANEL
HDL: 39.9 mg/dL (ref >39.00)
Total CHOL/HDL Ratio: 3
VLDL: 4.2 mg/dL (ref 0.0–40.0)

## 2012-02-04 LAB — HEPATIC FUNCTION PANEL
AST: 20 U/L (ref 0–37)
Albumin: 3.9 g/dL (ref 3.5–5.2)
Alkaline Phosphatase: 58 U/L (ref 39–117)
Bilirubin, Direct: 0.1 mg/dL (ref 0.0–0.3)
Total Protein: 6.7 g/dL (ref 6.0–8.3)

## 2012-02-04 NOTE — Progress Notes (Signed)
This encounter was created in error - please disregard.

## 2012-02-10 ENCOUNTER — Ambulatory Visit (INDEPENDENT_AMBULATORY_CARE_PROVIDER_SITE_OTHER): Payer: Medicare Other | Admitting: Pharmacist

## 2012-02-10 VITALS — Wt 191.0 lb

## 2012-02-10 DIAGNOSIS — E7800 Pure hypercholesterolemia, unspecified: Secondary | ICD-10-CM

## 2012-02-10 NOTE — Assessment & Plan Note (Addendum)
Assessment: Patient is very well controlled on Crestor 5 mg daily.  Goal is to prevent cardiovascular events.   TC 124, TG 21, HDL 39 (improved from 35.6 in Oct '12), LDL 80; LFTs WNL   Plan:   1) Continue Crestor 5 mg PO daily.   2) Continue diet and exercise regimen.  May increase his exercise up to his goal of 150 min/week which will also help with his HDL. 3) Patient can continue following up with PCP unless lipid issues arise.

## 2012-02-10 NOTE — Progress Notes (Signed)
HPI  Gregory [name] is a pleasant 71 yo white male presenting to the Lipid clinic for a follow up evaluation.  His PMH is significant for hypercholesterolemia.  Patient is tolerating his Crestor well.   Diet:  For breakfast, he does orange juice and Raisin Bran cereal.  For lunch, patient usually has a [place] sandwich- uses wheat breads, and has  sweetened fruit.  For dinner, his wife cooks chicken dishes and vegetable sides.  Vegetables include string beans, corn.  Patient drinks unsweetened tea, diet, caffeine free cokes.  Patient is avoiding fried foods, eats lots of salads, [place], chicken.  Exercise: 150 min/week goal, he's usually at about 90-100 minutes/week.  He does about 30 minutes on treadmill at a time.  Current Outpatient Prescriptions  Medication Sig Dispense Refill  . acetaminophen (TYLENOL) 500 MG tablet Take 500 mg by mouth as needed. Rotates with ibuprofen/naproxen. Does not take all three at one time.       . clidinium-chlordiazePOXIDE (LIBRAX) 2.5-5 MG per capsule TAKE ONE CAPSULE BY MOUTH 3 TIMES A DAY AS NEEDED FOR ABDOMINAL CRAMPING  90 capsule  2  . clorazepate (TRANXENE) 7.5 MG tablet Take 1 tablet (7.5 mg total) by mouth 3 (three) times daily as needed.  90 tablet  5  . Diphenhyd-Hydrocort-Nystatin (FIRST-DUKES MOUTHWASH) SUSP garlge and swallow 1 tsp four times daily as needed  120 mL  5  . levothyroxine (SYNTHROID, LEVOTHROID) 112 MCG tablet Take 1 tablet (112 mcg total) by mouth daily.  30 tablet  11  . loratadine (CLARITIN) 10 MG tablet Take 1 tablet (10 mg total) by mouth daily. As needed for allergies   30 tablet  2  . meclizine (ANTIVERT) 25 MG tablet Take 1/2 tablet to 1 whole tablet by mouth 4 times daily as directed for dizziness  90 tablet  11  . meloxicam (MOBIC) 7.5 MG tablet TAKE 1 TABLET BY MOUTH EVERY DAY AS NEEDED FOR ARTHRITIS PAIN  30 tablet  4  . Multiple Vitamins-Minerals (CENTRUM) tablet Take 1 tablet by mouth daily.        . pantoprazole (PROTONIX) 40  MG tablet TAKE 1 TABLET BY MOUTH ONCE A DAY AS NEEDED  30 tablet  5  . rosuvastatin (CRESTOR) 5 MG tablet Take 1 tablet (5 mg total) by mouth daily.  30 tablet  11  . silodosin (RAPAFLO) 8 MG CAPS capsule Take 1 capsule (8 mg total) by mouth daily.  30 capsule  11  . zolpidem (AMBIEN) 10 MG tablet Take 1 tablet (10 mg total) by mouth at bedtime as needed.  30 tablet  5  . naproxen sodium (ANAPROX) 220 MG tablet Take 220 mg by mouth as needed. Rotates with ibuprofen/acetaminophen. Does not take at same time as others.        Allergies  Allergen Reactions  . Zocor (Simvastatin - High Dose)     Leg cramps

## 2012-02-10 NOTE — Patient Instructions (Signed)
1.  Continue taking Crestor 5 mg by mouth daily. 2.  May increase exercise to meet his goal of 150 minutes/week.  This will also help with HDL, or the good cholesterol. 3.  Patient can follow up with PCP from now on unless other lipid issues arise.

## 2012-03-23 ENCOUNTER — Ambulatory Visit: Payer: Medicare Other | Admitting: Pulmonary Disease

## 2012-03-27 ENCOUNTER — Encounter: Payer: Self-pay | Admitting: Pulmonary Disease

## 2012-03-27 ENCOUNTER — Ambulatory Visit (INDEPENDENT_AMBULATORY_CARE_PROVIDER_SITE_OTHER): Payer: Medicare Other | Admitting: Pulmonary Disease

## 2012-03-27 VITALS — BP 108/64 | HR 58 | Temp 96.9°F | Ht 70.0 in | Wt 189.4 lb

## 2012-03-27 DIAGNOSIS — K589 Irritable bowel syndrome without diarrhea: Secondary | ICD-10-CM

## 2012-03-27 DIAGNOSIS — N401 Enlarged prostate with lower urinary tract symptoms: Secondary | ICD-10-CM

## 2012-03-27 DIAGNOSIS — E7800 Pure hypercholesterolemia, unspecified: Secondary | ICD-10-CM

## 2012-03-27 DIAGNOSIS — K219 Gastro-esophageal reflux disease without esophagitis: Secondary | ICD-10-CM

## 2012-03-27 DIAGNOSIS — N138 Other obstructive and reflux uropathy: Secondary | ICD-10-CM

## 2012-03-27 DIAGNOSIS — J3489 Other specified disorders of nose and nasal sinuses: Secondary | ICD-10-CM

## 2012-03-27 DIAGNOSIS — D126 Benign neoplasm of colon, unspecified: Secondary | ICD-10-CM

## 2012-03-27 DIAGNOSIS — G4700 Insomnia, unspecified: Secondary | ICD-10-CM

## 2012-03-27 DIAGNOSIS — E039 Hypothyroidism, unspecified: Secondary | ICD-10-CM

## 2012-03-27 DIAGNOSIS — I780 Hereditary hemorrhagic telangiectasia: Secondary | ICD-10-CM

## 2012-03-27 MED ORDER — PANTOPRAZOLE SODIUM 40 MG PO TBEC: 40.0000 mg | DELAYED_RELEASE_TABLET | Freq: Every day | ORAL | Status: DC

## 2012-03-27 MED ORDER — LEVOTHYROXINE SODIUM 112 MCG PO TABS: 112.0000 ug | ORAL_TABLET | Freq: Every day | ORAL | Status: DC

## 2012-03-27 MED ORDER — ROSUVASTATIN CALCIUM 5 MG PO TABS: 5.0000 mg | ORAL_TABLET | Freq: Every day | ORAL | Status: DC

## 2012-03-27 NOTE — Patient Instructions (Signed)
Today we updated your med list in our EPIC system...    Continue your current medications the same...  Today we did your follow up blood work...    We will call you w/ the results in a few days...  Call for any problems, otherwise let's plan a follow up visit in 6 month's time.Marland KitchenMarland Kitchen

## 2012-03-27 NOTE — Progress Notes (Addendum)
Subjective:    Patient ID: Gregory [name], male    DOB: 08-17-1941, 71 y.o.   MRN: [id number]  HPI 71 y/o WM here for a follow up visit... he has multiple medical problems including:  Hx OWR syndrome;  Hx atypCP;  Hyperchol;  Hypothyroidism;  GERD/ Divertics/ IBS/ Colon polyps;  BPH/ BOO;  DJD/ LBP/ Plantar fasciitis;  Hx dizziness;  Anxiety & chronic persistant insomnia...  ~  September 22, 2010:  he has several complaints today- musc cramps, dry throat & cough (mostly due to chr nasal prob & mouth breathing), dizzy spells (mostly when bending over w/ valsalva)... we discussed humidification, saline mist, MucinexDM, & physiology of the valsalva to avoid this problem... he denies interval bleeding, CP, SOB, etc... he would like the Flu shot, Rx for Shingles vaccine, & refill of meds.  ~  March 23, 2011:  6 mo [name] & his CC is leg pain/ nocturnal leg cramps x several months & pretty severe> he has scanned the internet & his questions were answered to the best of my ability;  We discussed avail remedies> Tonic water, Mustard, bar of soap in the bed, & Rx written for MagOx 400mg Qhs;  He also wants to stop the Simvastatin 40mg  for 55mo & see (he will call & let me know) >> he reports symptoms resolved off Simva.    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    Hx CP>  Prev cardiac w/u by DrCrenshaw was neg, no recurrent CP complaints, he remains active, etc...    Chol>  On Simva40 but wants to stop for 55mo due to the nocturnal leg pain he is having; FLP on med today shows TChol 104, TG 62, HDL 29, LDL 63...    Hypothy>  On Synthroid 140mcg/d>  Labs reveal TSH= 0.33 & review of flow sheet shows borderline over suppressed so we decided to decr his dose sl to 169mcg/d...    GI>  Followed by [facility] & had colonoscopy 3/12 at [facility]- neg x for hems & DrM rec  f/u 61yrs...    GU>  Hx BPH w/ BOO & followed by [id number] on Rapaflo 8ng/d;  PSA done today = 1.22...    Ortho>  eval 12/11 by DrNorris for bilat leg pain & cramping, ?min atrophy of left gastroc- he rec tonic water & neurology eval> he saw DrWillis 3/12 (note reviewed)> NCV was normal; EMG c/w mild S1 radiculopathy; he rec MRI- pending & to consider Zanaflex or Baclofen Rx...    Anxiety/ Insomnia>  He has Chlorazepate for Prn use, and Ambien to use for sleep Qhs...  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    Hx CP>  Prev cardiac w/u by DrCrenshaw was neg, no recurrent CP complaints,  he remains active, etc...    Chol>  On Crestor5 now; he stopped Simva40 due to muscle cramps which improved/ resolved off the med; went to Lipid clinic & they tried Cres5- tol well so far!  FLP today shows TChol 114, TG 40, HDL 36, LDL 70, and LFTs are normal...    Hypothy>  On Synthroid 13mcg/d now>  Labs 4/12 revealed TSH= 0.33 on Synthroid125 so we decr the dose slightly to 112.[name]    GI>  Followed by [facility] & had colonoscopy 3/12 at [facility]- neg x for hems & DrM rec f/u 57yrs...    GU>  Hx BPH w/ BOO & followed by ZOXWRUEA on Rapaflo 8mg /d;  PSA done 4/12 = 1.22...    Ortho>  eval 12/11 by DrNorris for bilat leg pain & cramping, ?min atrophy of left gastroc- he rec tonic water & neurology eval> he saw DrWillis 3/12 (note reviewed)> NCV was normal; EMG c/w mild S1  radiculopathy; he rec MRI- pending (we did not get f/u note);  Now c/o some right arm/ shoulder symptoms & I have rec eval by DrSypher to sort this out...    Anxiety/ Insomnia>  He has Chlorazepate for Prn use, and Ambien to use for sleep Qhs...  ~  March 27, 2012:  8mo [name] & Gregory [name] is stable- no new complaints or concerns... He continues to f/u w/ the Lipid Clinic & is taking Crestor 5mg /d (see labs below)... 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    Hx CP>  Prev cardiac w/u by DrCrenshaw was neg, no recurrent CP complaints, he remains active, etc...    Chol>  On Crestor5 now; he stopped Simva40 due to muscle cramps which improved/ resolved off the med; went to Lipid clinic & they tried Cres5- tol well so far!  FLP last= 2/13 & showed TChol 124, TG 21, HDL 40, LDL 80    Hypothy>  On Synthroid 141mcg/d now>  Labs 4/12 revealed TSH= 0.33 on Synthroid125 so we decr the dose slightly to 112.[name]    GI>  Followed by [facility] & had colonoscopy 3/12 at [facility]- neg x for hems & DrM rec f/u 9yrs...    GU>  Hx BPH w/ BOO & followed by [id number] on Rapaflo 8mg /d;  PSA done 4/12 = 1.22...    Ortho>  eval 12/11 by DrNorris for bilat leg pain & cramping, ?min atrophy of left gastroc- he rec tonic water & neurology eval> he saw DrWillis 3/12 (note reviewed)> NCV was normal; EMG c/w mild S1 radiculopathy; he rec MRI- pending (we did not get f/u note);  Now c/o some right arm/ shoulder symptoms & I have rec eval by DrSypher to sort this out...    Anxiety/ Insomnia>  He has Chlorazepate for Prn use, and Ambien to use for sleep  Qhs... We reviewed prob list, meds, xrays and labs> see below for updates >> LABS 4/13:  Chems- wnl;  CBC- wnl;  TSH=4.60;  PSA=1.31;  UA- clear           Problem List:   OTHER DISEASES OF NASAL CAVITY  AND SINUSES - Hx of Osler-Weber-Rendue syndrome/ hereditary telangiectasias... he's had numerous nose bleeds and surgery by Mikael Spray, now followed by Gayla Medicus... left nares is occluded w/ skin graft- no lumen... this is quite uncomfortable to the patient but he's been told there is nothing further that can be done for this... ~  3/11:  notes occas nosebleeds but he is able to handle them...  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 known GI telangiectasia as well without signif GI bleeding in the past- followed by [facility]... he's also has skin telangiectasis w/ prev laser therapy at [facility]... extensive nasal problems as above... ~  labs 2/09 showed Hg= 17.0 ~  labs 2/10 showed Hg= 16.4 ~  CXR 3/11 is chr incr markings esp RLL- no change, NAD...  ~  Labs 6/11 showed Hg= 15.9 ~  Labs 4/12 showed Hg= 16.0 ~  CXR 10/12 showed mild biapical scarring, RLL AVM, NAD... ~  Labs 4/13 showed Hg= 15.2  Hx of CHEST PAIN (ICD-786.50) - seen Sep09 w/ several recent bouts of CP while working in yard, washing car, etc... resolved w/ rest & Protonix... EKG showed WNL, prev NuclearStressTest 1/04 at [facility] was neg (no ischemia or infarction and EF= 61%)... Cardiac eval DrCrenshaw w/ 2DEcho 10/09 showing norm LV- no regional wall motion abn, norm LVF w/ EF= 60%, mild dil RA/ RV;  and norm MYOVIEW (no ischemia or infarction, EF= 58%)...  HYPERCHOLESTEROLEMIA, MILD (ICD-272.0) - now on CRESTOR 5mg /d>  Prev Rx w/ Simva40 but developed muscle cramping which resolved off the med. ~  FLP 6/08 showed TChol 208, TG 75, HDL 34, LDL 139... he preferred diet Rx- ~  FLP 2/09 showed TChol 172, TG 69,  HDL 30, LDL 129... he agreed to Crestor10... ~  FLP 8/09 on Crestor10 showed TChol 91, TG 49, HDL 28, LDL 54... rec- keep same. ~  FLP 2/10 on Crestor10 showed TChol 108, TG 56, HDL 31, LDL 66 ~  9/10:  we discussed changing Crestor10 to Simvastatin40 for $$$ reasons... ~  FLP 3/11 on Simva40 showed TChol 105, TG 59, HDL 39, LDL 54... continue same. ~  FLP 4/12 on Simva40 showed TChol 104, TG 62, HDL 29, LDL 63... C/o severe muscle cramps & wants to stop for 78mo> cramping resolved! ~  Referred to Lipid Clinic & they restarted his Crestor at 5mg /d> tol well so far... ~  FLP 10/12 on Cres5 showed TChol 114, TG 40, HDL 36, LDL 70 ~  FLP 2/13 on Cres5 showed TChol 124, TG 21, HDL 40, LDL 80  HYPOTHYROIDISM - on SYNTHROID 154mcg/d now; prev 14mcg/d dose was decreased 4/12... ~  labs 2/09 showed TSH= 0.69...  8/09 showed TSH= 0.44... ~  labs 2/10 showed TSH= 0.68 ~  labs 3/11 showed TSH= 0.52 ~  Labs 4/12 on Synthroid125 showed TSH= 0.33... We decided to decr the dose to 112/d. ~  Labs 4/13 on Levo112 showed TSH= 4.60  GERD, IRRITABLE BOWEL SYNDROME, COLONIC POLYPS - followed by [facility]... prev on Protonix, he changed to OTC Zantac 75mg  on his own... also takes  LIBRAX Prn... colonoscopy 1/04 w/ divertics, diminutive rectal polyp (adenomatous), hems... f/u colon 1/07 showed no recurrent polyps... ~  colonoscopy 3/12 at [facility]- neg x for hems & [facility] rec f/u 31yrs...  HYPERTROPHY PROSTATE W/UR OBST & OTH LUTS (ICD-600.01) - followed by ZOXWRUEA on RAPAFLO 8mg /d (stopped Flomax due to  side effect- ED)... doing well without LTOS, just complains of nocturia x 1-2 ~  labs 2/09 showed PSA= 0.76 ~  labs 2/10 showed PSA= 0.90 ~  2011 PSA checked by DrDavis (pt states it was OK)... ~  Labs 4/12 showed PSA= 1.22 ~  Labs 4/13 showed PSA= 1.31  LEFT ARM & SHOULDER PAIN >> he was checked by Alinda Sierras but not improving he says; we discussed second opinion at the [facility], DrSypher==>  notes reviewed.  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 ~  9/10:  requesting Rx for Robaxin for muscle spasm as needed.  PLANTAR FASCIITIS (ICD-728.71) - c/o pain in his feet secondary to plantar fasciitis eval & Rx from [facility] w/ Celebrex & shots... ~  9/10: we discussed change to [facility] - symptoms improved.  Hx of DIZZINESS (ICD-780.4) - sudden episode dizziness Sep09 working at [employer] around [employer] w/ room spinning- lasted and resolved spontaneously, felt light headed afterwards... no LOC, syncope, seizure activity, etc... no CP/ palpit/ SOB/ etc... there were several EMTs in the restaurant and they checked him out- stable and transported to ER... exam was neg-  they did labs: all norm, didn't do scans (can't get MRI due to metallic clamp in sinus), he can't take ASA due to bleeding from his OWR.[name]. treated w/ MECLIZINE w/ improvement.  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  Health Maintenance: ~  GI:  followed by [facility] & up to date on colon etc... (last 1/07 & 62yr f/u due 1/12). ~  GU:  followed by DrRDavis, and he did his PSA this yr. ~  Immunizations:  he getsyearly Flu vaccinations in the fall;  OK Pneumovax 03/12/10 at age[age];     Past Surgical History  Procedure Date  . Varicocelectomy 1991    Dr. Monico Blitz  . 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 Other surgical history     pulse laser for facial telangiectasias inthe past    Outpatient Encounter Prescriptions as of 03/27/2012  Medication Sig Dispense Refill  . acetaminophen (TYLENOL) 500 MG tablet Take 500 mg by mouth as needed. Rotates with ibuprofen/naproxen. Does not take all three at one time.       . clidinium-chlordiazePOXIDE (LIBRAX)  2.5-5 MG per capsule TAKE ONE CAPSULE BY MOUTH 3 TIMES A DAY AS NEEDED FOR ABDOMINAL CRAMPING  90 capsule  2  . clorazepate (TRANXENE) 7.5 MG tablet Take 1 tablet (7.5 mg total) by mouth 3 (three) times daily as needed.  90 tablet  5  . Diphenhyd-Hydrocort-Nystatin (FIRST-DUKES MOUTHWASH) SUSP garlge and swallow 1 tsp four times daily as needed  120 mL  5  . levothyroxine (SYNTHROID, LEVOTHROID) 112 MCG tablet Take 1 tablet (112 mcg total) by mouth daily.  30 tablet  11  . meclizine (ANTIVERT) 25 MG tablet Take 1/2 tablet to 1 whole tablet by mouth 4 times daily as directed for dizziness  90 tablet  11  . meloxicam (MOBIC) 7.5 MG tablet TAKE 1 TABLET BY MOUTH EVERY DAY AS NEEDED FOR ARTHRITIS PAIN  30 tablet  4  . Multiple Vitamins-Minerals (CENTRUM) tablet Take 1 tablet by mouth daily.        . naproxen sodium (ANAPROX) 220 MG tablet Take  220 mg by mouth as needed. Rotates with ibuprofen/acetaminophen. Does not take at same time as others.       . pantoprazole (PROTONIX) 40 MG tablet TAKE 1 TABLET BY MOUTH ONCE A DAY AS NEEDED  30 tablet  5  . rosuvastatin (CRESTOR) 5 MG tablet Take 1 tablet (5 mg total) by mouth daily.  30 tablet  11  . silodosin (RAPAFLO) 8 MG CAPS capsule Take 1 capsule (8 mg total) by mouth daily.  30 capsule  11  . zolpidem (AMBIEN) 10 MG tablet Take 1 tablet (10 mg total) by mouth at bedtime as needed.  30 tablet  5  . loratadine (CLARITIN) 10 MG tablet Take 1 tablet (10 mg total) by mouth daily. As needed for allergies   30 tablet  2    Allergies  Allergen Reactions  . Zocor (Simvastatin - High Dose)     Leg cramps     Current Medications, Allergies, Past Medical History, Past Surgical History, Family History, and Social History were reviewed in [employer] record.    Review of Systems       See HPI - other systems neg except as noted... The patient denies anorexia, fever, weight loss, weight gain, vision loss, decreased hearing, hoarseness,  chest pain, syncope, dyspnea on exertion, peripheral edema, prolonged cough, headaches, hemoptysis, abdominal pain, melena, hematochezia, severe indigestion/heartburn, hematuria, incontinence, muscle weakness, suspicious skin lesions, transient blindness, difficulty walking, depression, unusual weight change, abnormal bleeding, enlarged lymph nodes, and angioedema.     Objective:   Physical Exam      WD, WN, 71 y/o WM in NAD... GENERAL:  Alert & oriented; pleasant & cooperative... HEENT:  [place]/AT, EOM-wnl, PERRLA, EACs-clear, TMs-wnl, NOSE- occluded left nares from skin grafts, MOUTH- mucosal telangiectasias.  NECK:  Supple w/ fairROM; no JVD; normal carotid impulses w/o bruits; no thyromegaly or nodules palpated; no lymphadenopathy. CHEST:  Clear to P & A; without wheezes/ rales/ or rhonchi. HEART:  Regular Rhythm; without murmurs/ rubs/ or gallops. ABDOMEN:  Soft & nontender; normal bowel sounds; no organomegaly or masses detected. EXT: without deformities, mild arthritic changes; no varicose veins/ venous insuffic/ or edema. NEURO:  CN's intact; motor testing normal; sensory testing normal; gait normal & balance OK. DERM:  mult telangiectasias...  RADIOLOGY DATA:  Reviewed in the EPIC EMR & discussed w/ the patient...  LABORATORY DATA:  Reviewed in the EPIC EMR & discussed w/ the patient...   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 CXR w/o change in the RLL AVM.     Hx CP>  Prev cardiac w/u by DrCrenshaw was neg, no recurrent CP complaints, he remains active, etc...     Chol>  On Crestor5 now; he stopped Simva40 due to muscle cramps which improved/ resolved off the  med; went to Lipid clinic & they tried Cres5- tol well so far!  FLP today shows TChol 114, TG 40, HDL 36, LDL 70, and LFTs are normal...     Hypothy>  On Synthroid 143mcg/d now>  Labs 4/12 revealed TSH= 0.33 on Synthroid125 so we decr the dose slightly to 112.[name]     GI>  Followed by ZOXWRUEA & had  colonoscopy 3/12 at [facility]- neg x for hems & DrM rec f/u 38yrs...     GU>  Hx BPH w/ BOO & followed by ZOXWRUEA on Rapaflo 8mg /d;  PSA done 4/12 = 1.22...     Ortho>  eval 12/11 by DrNorris for bilat leg pain & cramping, ?min atrophy of left gastroc- he rec tonic water & neurology eval> he saw DrWillis 3/12 (note reviewed)> NCV was normal; EMG c/w mild S1 radiculopathy; he rec MRI- pending (we did not get f/u note);  Now c/o some right arm/ shoulder symptoms & I have rec eval by DrSypher to sort this out...     Anxiety/ Insomnia>  He has Chlorazepate for Prn use, and Ambien to use for sleep Qhs.Marland KitchenMarland Kitchen

## 2012-03-29 ENCOUNTER — Other Ambulatory Visit (INDEPENDENT_AMBULATORY_CARE_PROVIDER_SITE_OTHER): Payer: Medicare Other

## 2012-03-29 DIAGNOSIS — N401 Enlarged prostate with lower urinary tract symptoms: Secondary | ICD-10-CM

## 2012-03-29 DIAGNOSIS — D126 Benign neoplasm of colon, unspecified: Secondary | ICD-10-CM

## 2012-03-29 DIAGNOSIS — E7800 Pure hypercholesterolemia, unspecified: Secondary | ICD-10-CM

## 2012-03-29 DIAGNOSIS — E039 Hypothyroidism, unspecified: Secondary | ICD-10-CM

## 2012-03-29 DIAGNOSIS — I780 Hereditary hemorrhagic telangiectasia: Secondary | ICD-10-CM

## 2012-03-29 LAB — CBC WITH DIFFERENTIAL/PLATELET
HCT: 45.3 % (ref 39.0–52.0)
Hemoglobin: 15.2 g/dL (ref 13.0–17.0)
MCHC: 33.5 g/dL (ref 30.0–36.0)
Platelets: 193 10*3/uL (ref 150.0–400.0)
RDW: 13.4 % (ref 11.5–14.6)

## 2012-03-29 LAB — BASIC METABOLIC PANEL
CO2: 28 mEq/L (ref 19–32)
Calcium: 9.2 mg/dL (ref 8.4–10.5)
GFR: 66.93 mL/min (ref >60.00)
Glucose, Bld: 81 mg/dL (ref 70–99)
Potassium: 4.7 mEq/L (ref 3.5–5.1)
Sodium: 141 mEq/L (ref 135–145)

## 2012-03-29 LAB — URINALYSIS
Nitrite: NEGATIVE
Specific Gravity, Urine: 1.015 (ref 1.000–1.030)
Total Protein, Urine: NEGATIVE
Urine Glucose: NEGATIVE
pH: 6 (ref 5.0–8.0)

## 2012-03-29 LAB — TSH: TSH: 4.6 u[IU]/mL (ref 0.35–5.50)

## 2012-03-29 LAB — HEPATIC FUNCTION PANEL
AST: 21 U/L (ref 0–37)
Alkaline Phosphatase: 52 U/L (ref 39–117)
Bilirubin, Direct: 0.1 mg/dL (ref 0.0–0.3)

## 2012-03-29 LAB — PSA: PSA: 1.31 ng/mL (ref 0.10–4.00)

## 2012-04-04 ENCOUNTER — Other Ambulatory Visit: Payer: Self-pay | Admitting: Pulmonary Disease

## 2012-05-10 ENCOUNTER — Other Ambulatory Visit: Payer: Self-pay | Admitting: Pulmonary Disease

## 2012-06-16 ENCOUNTER — Other Ambulatory Visit: Payer: Self-pay | Admitting: Pulmonary Disease

## 2012-06-16 MED ORDER — CLORAZEPATE DIPOTASSIUM 7.5 MG PO TABS: 7.5000 mg | ORAL_TABLET | Freq: Three times a day (TID) | ORAL | Status: DC | PRN

## 2012-06-16 NOTE — Telephone Encounter (Signed)
[facility]  Clorazepate 7.5 mg <> take 1 tablet upto 3 times a day as needed for anxiety #90 X5. Pt has appt on 09-27-2012 rx called in

## 2012-07-03 ENCOUNTER — Telehealth: Payer: Self-pay | Admitting: Pulmonary Disease

## 2012-07-03 NOTE — Telephone Encounter (Signed)
Form has been completed and will fax back once signed by SN.

## 2012-07-03 NOTE — Telephone Encounter (Signed)
Called [facility] and they stated that the clorazepate does need PA.  Called [phone] and am waiting on a fax to be sent over now.  Will fill this out and have SN sign this once completed.

## 2012-07-04 NOTE — Telephone Encounter (Signed)
Forms signed and faxed back. Will place sheet in Leigh's hold box or approval/denial.

## 2012-07-05 NOTE — Telephone Encounter (Signed)
Clorazepate has been approved through 07/04/2013 called and spoke with [facility] and they are aware.

## 2012-08-11 ENCOUNTER — Other Ambulatory Visit: Payer: Self-pay | Admitting: Pulmonary Disease

## 2012-08-14 ENCOUNTER — Ambulatory Visit (INDEPENDENT_AMBULATORY_CARE_PROVIDER_SITE_OTHER): Payer: Medicare Other | Admitting: Adult Health

## 2012-08-14 ENCOUNTER — Other Ambulatory Visit: Payer: Self-pay | Admitting: Urology

## 2012-08-14 ENCOUNTER — Ambulatory Visit (INDEPENDENT_AMBULATORY_CARE_PROVIDER_SITE_OTHER)
Admission: RE | Admit: 2012-08-14 | Discharge: 2012-08-14 | Disposition: A | Discharge status: Home / Self Care | Payer: Medicare Other | Source: Ambulatory Visit | Attending: Adult Health | Admitting: Adult Health

## 2012-08-14 ENCOUNTER — Encounter: Payer: Self-pay | Admitting: Adult Health

## 2012-08-14 VITALS — BP 110/60 | HR 65 | Temp 97.2°F | Ht 70.0 in | Wt 189.0 lb

## 2012-08-14 DIAGNOSIS — R05 Cough: Secondary | ICD-10-CM

## 2012-08-14 DIAGNOSIS — J40 Bronchitis, not specified as acute or chronic: Secondary | ICD-10-CM

## 2012-08-14 DIAGNOSIS — R059 Cough, unspecified: Secondary | ICD-10-CM

## 2012-08-14 MED ORDER — HYDROCODONE-HOMATROPINE 5-1.5 MG/5ML PO SYRP: 5.0000 mL | ORAL_SOLUTION | Freq: Four times a day (QID) | ORAL | Status: AC | PRN

## 2012-08-14 NOTE — Progress Notes (Signed)
Subjective:    Patient ID: Gregory [name], male    DOB: [birth year], 71 y.o.   MRN: [id number]  HPI 71 y/o WM - he has multiple medical problems including:  Hx OWR syndrome;  Hx atypCP;  Hyperchol;  Hypothyroidism;  GERD/ Divertics/ IBS/ Colon polyps;  BPH/ BOO;  DJD/ LBP/ Plantar fasciitis;  Hx dizziness;  Anxiety & chronic persistant insomnia...  ~  September 22, 2010:  he has several complaints today- musc cramps, dry throat & cough (mostly due to chr nasal prob & mouth breathing), dizzy spells (mostly when bending over w/ valsalva)... we discussed humidification, saline mist, MucinexDM, & physiology of the valsalva to avoid this problem... he denies interval bleeding, CP, SOB, etc... he would like the Flu shot, Rx for Shingles vaccine, & refill of meds.  ~  March 23, 2011:  6 mo [name] & his CC is leg pain/ nocturnal leg cramps x several months & pretty severe> he has scanned the internet & his questions were answered to the best of my ability;  We discussed avail remedies> Tonic water, Mustard, bar of soap in the bed, & Rx written for MagOx 400mg Qhs;  He also wants to stop the Simvastatin 40mg  for 40mo & see (he will call & let me know) >> he reports symptoms resolved off Simva.    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    Hx CP>  Prev cardiac w/u by DrCrenshaw was neg, no recurrent CP complaints, he remains active, etc...    Chol>  On Simva40 but wants to stop for 40mo due to the nocturnal leg pain he is having; FLP on med today shows TChol 104, TG 62, HDL 29, LDL 63...    Hypothy>  On Synthroid 14mcg/d>  Labs reveal TSH= 0.33 & review of flow sheet shows borderline over suppressed so we decided to decr his dose sl to 121mcg/d...    GI>  Followed by [facility] & had colonoscopy 3/12 at [facility]- neg x for hems & DrM rec f/u 77yrs...    GU>  Hx BPH  w/ BOO & followed by [id number] on Rapaflo 8ng/d;  PSA done today = 1.22...    Ortho>  eval 12/11 by DrNorris for bilat leg pain & cramping, ?min atrophy of left gastroc- he rec tonic water & neurology eval> he saw DrWillis 3/12 (note reviewed)> NCV was normal; EMG c/w mild S1 radiculopathy; he rec MRI- pending & to consider Zanaflex or Baclofen Rx...    Anxiety/ Insomnia>  He has Chlorazepate for Prn use, and Ambien to use for sleep Qhs...  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    Hx CP>  Prev cardiac w/u by DrCrenshaw was neg, no recurrent CP complaints, he remains active, etc..[name]  Chol>  On Crestor5 now; he stopped Simva40 due to muscle cramps which improved/ resolved off the med; went to Lipid clinic & they tried Cres5- tol well so far!  FLP today shows TChol 114, TG 40, HDL 36, LDL 70, and LFTs are normal...    Hypothy>  On Synthroid 127mcg/d now>  Labs 4/12 revealed TSH= 0.33 on Synthroid125 so we decr the dose slightly to 112.[name]    GI>  Followed by [facility] & had colonoscopy 3/12 at [facility]- neg x for hems & DrM rec f/u 20yrs...    GU>  Hx BPH w/ BOO & followed by ZOXWRUEA on Rapaflo 8mg /d;  PSA done 4/12 = 1.22...    Ortho>  eval 12/11 by DrNorris for bilat leg pain & cramping, ?min atrophy of left gastroc- he rec tonic water & neurology eval> he saw DrWillis 3/12 (note reviewed)> NCV was normal; EMG c/w mild S1 radiculopathy; he rec MRI- pending (we  did not get f/u note);  Now c/o some right arm/ shoulder symptoms & I have rec eval by DrSypher to sort this out...    Anxiety/ Insomnia>  He has Chlorazepate for Prn use, and Ambien to use for sleep Qhs...  ~  March 27, 2012:  69mo [name] & Gregory [name] is stable- no new complaints or concerns... He continues to f/u w/ the Lipid Clinic & is taking Crestor 5mg /d (see labs below)... 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    Hx CP>  Prev cardiac w/u by DrCrenshaw was neg, no recurrent CP complaints, he remains active, etc...    Chol>  On Crestor5 now; he stopped Simva40 due to muscle cramps which improved/ resolved off the med; went to Lipid clinic & they tried Cres5- tol well so far!  FLP last= 2/13 & showed TChol 124, TG 21, HDL 40, LDL 80    Hypothy>  On Synthroid 141mcg/d now>  Labs 4/12 revealed TSH= 0.33 on Synthroid125 so we decr the dose slightly to 112.[name]    GI>  Followed by [facility] & had colonoscopy 3/12 at [facility]- neg x for hems & DrM rec f/u 85yrs...    GU>  Hx BPH w/ BOO & followed by [id number] on Rapaflo 8mg /d;  PSA done 4/12 = 1.22...    Ortho>  eval 12/11 by DrNorris for bilat leg pain & cramping, ?min atrophy of left gastroc- he rec tonic water & neurology eval> he saw DrWillis 3/12 (note reviewed)> NCV was normal; EMG c/w mild S1 radiculopathy; he rec MRI- pending (we did not get f/u note);  Now c/o some right arm/ shoulder symptoms & I have rec eval by DrSypher to sort this out...    Anxiety/ Insomnia>  He has Chlorazepate for Prn use, and Ambien to use for sleep Qhs... We reviewed prob list, meds, xrays and  labs> see below for updates >> LABS 4/13:  Chems- wnl;  CBC- wnl;  TSH=4.60;  PSA=1.31;  UA- clear   08/14/2012 Acute OV  Complains of  dry cough that comes and goes x 4-6 wks and a dull pain on right lower chest  and ribs. x 2 wk - worse when moving or breathing deep.  No sinus drainage . No hemoptysis .  No discolored mucus or fever.  Last cxr was 2012 - chronic changes  Using robitussum without much help. Cough keeping her up at night .            Problem List:   OTHER DISEASES OF NASAL CAVITY AND SINUSES - Hx of Osler-Weber-Rendue syndrome/ hereditary telangiectasias... he's had numerous nose bleeds and surgery by Mikael Spray, now followed by Gayla Medicus... left nares is occluded w/ skin graft- no lumen... this is quite uncomfortable to the patient but he's been told there is nothing further that can be done for this... ~  3/11:  notes occas nosebleeds but he is able to handle them...  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 known GI telangiectasia as well without signif GI bleeding in the past- followed by [facility]... he's also has skin telangiectasis w/ prev laser therapy at [facility]... extensive nasal problems as above... ~  labs 2/09 showed Hg= 17.0 ~  labs 2/10 showed Hg= 16.4 ~  CXR 3/11 is chr incr markings esp RLL- no change, NAD...  ~  Labs 6/11 showed Hg= 15.9 ~  Labs 4/12 showed Hg= 16.0 ~  CXR 10/12 showed mild biapical scarring, RLL AVM, NAD... ~  Labs 4/13 showed Hg= 15.2  Hx of CHEST PAIN (ICD-786.50) - seen Sep09 w/ several recent bouts of CP while working in yard, washing car, etc... resolved w/ rest & Protonix... EKG showed WNL, prev NuclearStressTest 1/04 at [facility] was neg (no ischemia or infarction and EF= 61%)... Cardiac eval DrCrenshaw w/ 2DEcho 10/09 showing norm LV- no regional wall motion abn, norm LVF w/ EF= 60%, mild dil RA/ RV;  and norm  MYOVIEW (no ischemia or infarction, EF= 58%)...  HYPERCHOLESTEROLEMIA, MILD (ICD-272.0) - now on CRESTOR 5mg /d>  Prev Rx w/ Simva40 but developed muscle cramping which resolved off the med. ~  FLP 6/08 showed TChol 208, TG 75, HDL 34, LDL 139... he preferred diet Rx- ~  FLP 2/09 showed TChol 172, TG 69, HDL 30, LDL 129... he agreed to Crestor10... ~  FLP 8/09 on Crestor10 showed TChol 91, TG 49, HDL 28, LDL 54... rec- keep same. ~  FLP 2/10 on Crestor10 showed TChol 108, TG 56, HDL 31, LDL 66 ~  9/10:  we discussed changing Crestor10 to Simvastatin40 for $$$ reasons... ~  FLP 3/11 on Simva40 showed TChol 105, TG 59, HDL 39, LDL 54... continue same. ~  FLP 4/12 on Simva40 showed TChol 104, TG 62, HDL 29, LDL 63... C/o severe muscle cramps & wants to stop for 67mo> cramping resolved! ~  Referred to Lipid Clinic & they restarted his Crestor at 5mg /d> tol well so far... ~  FLP 10/12 on Cres5 showed TChol 114, TG 40, HDL 36, LDL 70 ~  FLP 2/13 on Cres5 showed TChol 124, TG 21, HDL 40, LDL 80  HYPOTHYROIDISM - on SYNTHROID 136mcg/d now; prev 143mcg/d dose was decreased 4/12... ~  labs 2/09 showed TSH= 0.69...  8/09 showed TSH= 0.44... ~  labs 2/10 showed TSH= 0.68 ~  labs 3/11 showed TSH= 0.52 ~  Labs 4/12 on Synthroid125 showed TSH= 0.33... We decided to decr the dose to 112/d. ~  Labs 4/13 on Levo112 showed TSH= 4.60  GERD, IRRITABLE BOWEL SYNDROME, COLONIC POLYPS - followed by [facility]... prev on  Protonix, he changed to OTC Zantac 75mg  on his own... also takes  LIBRAX Prn... colonoscopy 1/04 w/ divertics, diminutive rectal polyp (adenomatous), hems... f/u colon 1/07 showed no recurrent polyps... ~  colonoscopy 3/12 at [facility]- neg x for hems & [facility] rec f/u 45yrs...  HYPERTROPHY PROSTATE W/UR OBST & OTH LUTS (ICD-600.01) - followed by ZOXWRUEA on RAPAFLO 8mg /d (stopped Flomax due to side effect- ED)... doing well without LTOS, just complains of nocturia x 1-2 ~  labs 2/09 showed  PSA= 0.76 ~  labs 2/10 showed PSA= 0.90 ~  2011 PSA checked by DrDavis (pt states it was OK)... ~  Labs 4/12 showed PSA= 1.22 ~  Labs 4/13 showed PSA= 1.31  LEFT ARM & SHOULDER PAIN >> he was checked by Alinda Sierras but not improving he says; we discussed second opinion at [facility], DrSypher==> notes reviewed.  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 ~  9/10:  requesting Rx for Robaxin for muscle spasm as needed.  PLANTAR FASCIITIS (ICD-728.71) - c/o pain in his feet secondary to plantar fasciitis eval & Rx from [facility] w/ Celebrex & shots... ~  9/10: we discussed change to [facility] - symptoms improved.  Hx of DIZZINESS (ICD-780.4) - sudden episode dizziness Sep09 working at [employer] around [employer] w/ room spinning- lasted and resolved spontaneously, felt light headed afterwards... no LOC, syncope, seizure activity, etc... no CP/ palpit/ SOB/ etc... there were several EMTs in the restaurant and they checked him out- stable and transported to ER... exam was neg-  they did labs: all norm, didn't do scans (can't get MRI due to metallic clamp in sinus), he can't take ASA due to bleeding from his OWR.[name]. treated w/ MECLIZINE w/ improvement.  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  Health Maintenance: ~  GI:  followed by [facility] & up to date on colon etc... (last 1/07 & 43yr f/u due 1/12). ~  GU:  followed by DrRDavis, and he did his PSA this yr. ~  Immunizations:  he getsyearly Flu vaccinations in the fall;  OK Pneumovax 03/12/10 at age[age];     Past Surgical History  Procedure Date  . Varicocelectomy 1991    Dr. Monico Blitz  . 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 Other surgical history     pulse laser for facial  telangiectasias inthe past    Outpatient Encounter Prescriptions as of 08/14/2012  Medication Sig Dispense Refill  . acetaminophen (TYLENOL) 500 MG tablet Take 500 mg by mouth as needed. Rotates with ibuprofen/naproxen. Does not take all three at one time.       . clidinium-chlordiazePOXIDE (LIBRAX) 2.5-5 MG per capsule TAKE ONE CAPSULE BY MOUTH 3 TIMES A DAY AS NEEDED FOR ABDOMINAL CRAMPING  90 capsule  2  . clorazepate (TRANXENE) 7.5 MG tablet Take 1 tablet (7.5 mg total) by mouth 3 (three) times daily as needed.  90 tablet  5  . CRESTOR 5 MG tablet TAKE 1 TABLET BY MOUTH EVERY DAY  30 tablet  11  . Diphenhyd-Hydrocort-Nystatin (FIRST-DUKES MOUTHWASH) SUSP garlge and swallow 1 tsp four times daily as needed  120 mL  5  . levothyroxine (SYNTHROID, LEVOTHROID) 112 MCG tablet TAKE 1 TABLET BY MOUTH EVERY DAY  30 tablet  11  . loratadine (CLARITIN) 10 MG tablet Take 1 tablet (10 mg total) by mouth daily. As needed for allergies   30 tablet  2  . meclizine (ANTIVERT) 25 MG tablet Take 1/2 tablet to 1 whole tablet by mouth 4 times daily as directed for dizziness  90 tablet  11  . meloxicam (MOBIC) 7.5 MG tablet TAKE 1 TABLET BY MOUTH EVERY DAY AS NEEDED FOR ARTHRITIS PAIN  30 tablet  4  . Multiple Vitamins-Minerals (CENTRUM) tablet Take 1 tablet by mouth daily.        . naproxen sodium (ANAPROX) 220 MG tablet Take 220 mg by mouth as needed. Rotates with ibuprofen/acetaminophen. Does not take at same time as others.       . pantoprazole (PROTONIX) 40 MG tablet Take 40 mg by mouth every other day.      . silodosin (RAPAFLO) 8 MG CAPS capsule Take 1 capsule (8 mg total) by mouth daily.  30 capsule  11  . zolpidem (AMBIEN) 10 MG tablet Take 1 tablet (10 mg total) by mouth at bedtime as needed.  30 tablet  5  . DISCONTD: levothyroxine (SYNTHROID, LEVOTHROID) 112 MCG tablet Take 1 tablet (112 mcg total) by mouth daily.  30 tablet  11  . DISCONTD: pantoprazole (PROTONIX) 40 MG tablet Take 1 tablet (40 mg  total) by mouth daily.  30 tablet  11  . DISCONTD: pantoprazole (PROTONIX) 40 MG tablet TAKE 1 TABLET BY MOUTH ONCE A DAY AS NEEDED  30 tablet  5  . DISCONTD: pantoprazole (PROTONIX) 40 MG tablet       . DISCONTD: rosuvastatin (CRESTOR) 5 MG tablet Take 1 tablet (5 mg total) by mouth daily.  30 tablet  11    Allergies  Allergen Reactions  . Zocor (Simvastatin - High Dose)     Leg cramps     Current Medications, Allergies, Past Medical History, Past Surgical History, Family History, and Social History were reviewed in [employer] record.    Review of Systems       Constitutional:   No  weight loss, night sweats,  Fevers, chills, fatigue, or  lassitude.  HEENT:   No headaches,  Difficulty swallowing,  Tooth/dental problems, or  Sore throat,                No sneezing, itching, ear ache, nasal congestion, post nasal drip,   CV:  No chest pain,  Orthopnea, PND, swelling in lower extremities, anasarca, dizziness, palpitations, syncope.   GI  No heartburn, indigestion, abdominal pain, nausea, vomiting, diarrhea, change in bowel habits, loss of appetite, bloody stools.   Resp: No shortness of breath with exertion or at rest.  No excess mucus, no productive cough,   No coughing up of blood.  No change in color of mucus.  No wheezing.  No chest wall deformity  Skin: no rash or lesions.  GU: no dysuria, change in color of urine, no urgency or frequency.  No flank pain, no hematuria   MS:  No joint pain or swelling.  No decreased range of motion.  No back pain.  Psych:  No change in mood or affect. No depression or anxiety.  No memory loss.       Objective:   Physical Exam      WD, WN, 71 y/o WM in NAD... GENERAL:  Alert & oriented; pleasant & cooperative... HEENT:  [place]/AT, EACs-clear, TMs-wnl, NOSE- occluded left nares from skin grafts, MOUTH-  mucosal telangiectasias.  NECK:  Supple w/ fairROM; no JVD; normal carotid impulses w/o bruits; no thyromegaly or  nodules palpated; no lymphadenopathy. CHEST:  Clear to P & A; without wheezes/ rales/ or rhonchi. Tender along right lower ribs , no deformity or bruising noted.  HEART:  Regular Rhythm; without murmurs/ rubs/ or gallops. ABDOMEN:  Soft & nontender; normal bowel sounds; no organomegaly or masses detected. EXT: without deformities, mild arthritic changes; no varicose veins/ venous insuffic/ or edema. NEURO:   gait normal & balance OK. DERM:  mult telangiectasias...  RADIOLOGY DATA:  Reviewed in the EPIC EMR & discussed w/ the patient...  LABORATORY DATA:  Reviewed in the EPIC EMR & discussed w/ the patient...   Assessment & Plan:

## 2012-08-14 NOTE — Assessment & Plan Note (Signed)
Mild Bronchitis with suspected pleurisy  cxr today  Hold on abx for now   Plan Delsym 2 tsp Twice daily  As needed  Cough  Hydromet 1-2 tsp every 4-6 hr As needed  Cough, may make you sleepy  Mobic daily for 7 days then As needed   Warm heat to ribs As needed   I will call with xray results.  Please contact office for sooner follow up if symptoms do not improve or worsen or seek emergency care  follow up Dr. Kriste Basque  In 1 month as planned for physical .

## 2012-08-14 NOTE — Patient Instructions (Addendum)
Delsym 2 tsp Twice daily  As needed  Cough  Hydromet 1-2 tsp every 4-6 hr As needed  Cough, may make you sleepy  Mobic daily for 7 days then As needed   Warm heat to ribs As needed   I will call with xray results.  Please contact office for sooner follow up if symptoms do not improve or worsen or seek emergency care  follow up Dr. Kriste Basque  In 1 month as planned for physical .

## 2012-08-29 ENCOUNTER — Encounter (HOSPITAL_BASED_OUTPATIENT_CLINIC_OR_DEPARTMENT_OTHER): Payer: Self-pay | Admitting: *Deleted

## 2012-08-29 NOTE — Progress Notes (Signed)
PT STATES HAS NO KNOWLEDGE OF SURGERY BEING SCHEDULED AND HAS NOT RECEIVED ANY INFO VIA MAIL. VERIFIED HIS NAME AND DOB AND PRE-OP DX , HEMATURIA. PT STATES HAS NOT SEEN AN UROLOGIST FOR A YEAR AND DOES NOT HAVE AN APPT W/ ONE.  LM W/ SELITA OR SCHEDULER FOR DR NESI FOR ADVISEMENT.

## 2012-09-01 ENCOUNTER — Ambulatory Visit (HOSPITAL_BASED_OUTPATIENT_CLINIC_OR_DEPARTMENT_OTHER): Admission: RE | Admit: 2012-09-01 | Payer: Medicare Other | Source: Ambulatory Visit | Admitting: Urology

## 2012-09-01 ENCOUNTER — Encounter (HOSPITAL_BASED_OUTPATIENT_CLINIC_OR_DEPARTMENT_OTHER): Admission: RE | Payer: Self-pay | Source: Ambulatory Visit

## 2012-09-01 HISTORY — DX: Personal history of other malignant neoplasm of skin: Z85.828

## 2012-09-01 HISTORY — DX: Personal history of (corrected) congenital malformations of heart and circulatory system: Z87.74

## 2012-09-01 HISTORY — DX: Unspecified osteoarthritis, unspecified site: M19.90

## 2012-09-01 HISTORY — DX: Gastro-esophageal reflux disease without esophagitis: K21.9

## 2012-09-01 HISTORY — DX: Hereditary hemorrhagic telangiectasia: I78.0

## 2012-09-01 SURGERY — CYSTOSCOPY
Anesthesia: General

## 2012-09-26 ENCOUNTER — Encounter: Payer: Self-pay | Admitting: *Deleted

## 2012-09-27 ENCOUNTER — Encounter: Payer: Self-pay | Admitting: Pulmonary Disease

## 2012-09-27 ENCOUNTER — Ambulatory Visit (INDEPENDENT_AMBULATORY_CARE_PROVIDER_SITE_OTHER): Payer: Medicare Other | Admitting: Pulmonary Disease

## 2012-09-27 VITALS — BP 126/72 | HR 64 | Temp 96.9°F | Ht 70.0 in | Wt 185.6 lb

## 2012-09-27 DIAGNOSIS — M545 Low back pain, unspecified: Secondary | ICD-10-CM

## 2012-09-27 DIAGNOSIS — K219 Gastro-esophageal reflux disease without esophagitis: Secondary | ICD-10-CM

## 2012-09-27 DIAGNOSIS — N400 Enlarged prostate without lower urinary tract symptoms: Secondary | ICD-10-CM

## 2012-09-27 DIAGNOSIS — R05 Cough: Secondary | ICD-10-CM | POA: Insufficient documentation

## 2012-09-27 DIAGNOSIS — K589 Irritable bowel syndrome without diarrhea: Secondary | ICD-10-CM

## 2012-09-27 DIAGNOSIS — N401 Enlarged prostate with lower urinary tract symptoms: Secondary | ICD-10-CM

## 2012-09-27 DIAGNOSIS — E7800 Pure hypercholesterolemia, unspecified: Secondary | ICD-10-CM

## 2012-09-27 DIAGNOSIS — I780 Hereditary hemorrhagic telangiectasia: Secondary | ICD-10-CM

## 2012-09-27 DIAGNOSIS — F411 Generalized anxiety disorder: Secondary | ICD-10-CM

## 2012-09-27 DIAGNOSIS — N320 Bladder-neck obstruction: Secondary | ICD-10-CM

## 2012-09-27 DIAGNOSIS — J3489 Other specified disorders of nose and nasal sinuses: Secondary | ICD-10-CM

## 2012-09-27 DIAGNOSIS — E039 Hypothyroidism, unspecified: Secondary | ICD-10-CM

## 2012-09-27 DIAGNOSIS — N138 Other obstructive and reflux uropathy: Secondary | ICD-10-CM

## 2012-09-27 DIAGNOSIS — R053 Chronic cough: Secondary | ICD-10-CM

## 2012-09-27 DIAGNOSIS — C4490 Unspecified malignant neoplasm of skin, unspecified: Secondary | ICD-10-CM

## 2012-09-27 DIAGNOSIS — D126 Benign neoplasm of colon, unspecified: Secondary | ICD-10-CM

## 2012-09-27 DIAGNOSIS — G4700 Insomnia, unspecified: Secondary | ICD-10-CM

## 2012-09-27 DIAGNOSIS — Z23 Encounter for immunization: Secondary | ICD-10-CM

## 2012-09-27 MED ORDER — FLUTICASONE-SALMETEROL 115-21 MCG/ACT IN AERO: 2.0000 | INHALATION_SPRAY | Freq: Two times a day (BID) | RESPIRATORY_TRACT | Status: DC

## 2012-09-27 NOTE — Patient Instructions (Addendum)
Today we updated your med list in our EPIC system...    Continue your current medications the same...  We decided to add ADVAIR-HFA 115/21> 2 puffs twice daily & then rinse (the nurse will show you the proper inhallation technique)...    Continue the lozenges etc as needed...  We will also arrange for a UROLOGY follow up for your BPH symptoms...    Continue the Rapaflo in the interim...  We gave you the 2013 Flu vaccine today...  Call for any problems...  Let's plan a follow up visit in 6 months w/ FASTING blood work at that time.Marland KitchenMarland Kitchen

## 2012-09-27 NOTE — Progress Notes (Signed)
Subjective:    Patient ID: Gregory [name], male    DOB: 1941-01-24, 71 y.o.   MRN: [id number]  HPI 71 y/o WM here for a follow up visit... he has multiple medical problems including:  Hx OWR syndrome;  Hx atypCP;  Hyperchol;  Hypothyroidism;  GERD/ Divertics/ IBS/ Colon polyps;  BPH/ BOO;  DJD/ LBP/ Plantar fasciitis;  Hx dizziness;  Anxiety & chronic persistant insomnia...  ~  March 23, 2011:  6 mo [name] & his CC is leg pain/ nocturnal leg cramps x several months & pretty severe> he has scanned the internet & his questions were answered to the best of my ability;  We discussed avail remedies> Tonic water, Mustard, bar of soap in the bed, & Rx written for MagOx 400mg Qhs;  He also wants to stop the Simvastatin 40mg  for 659mo & see (he will call & let me know) >> he reports symptoms resolved off Simva.    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    Hx CP>  Prev cardiac w/u by DrCrenshaw was neg, no recurrent CP complaints, he remains active, etc...    Chol>  On Simva40 but wants to stop for 659mo due to the nocturnal leg pain he is having; FLP on med today shows TChol 104, TG 62, HDL 29, LDL 63...    Hypothy>  On Synthroid 17mcg/d>  Labs reveal TSH= 0.33 & review of flow sheet shows borderline over suppressed so we decided to decr his dose sl to 154mcg/d...    GI>  Followed by [facility] & had colonoscopy 3/12 at [facility]- neg x for hems & DrM rec f/u 72yrs...    GU>  Hx BPH w/ BOO & followed by [id number] on Rapaflo 8ng/d;  PSA done today = 1.22...    Ortho>  eval 12/11 by DrNorris for bilat leg pain & cramping, ?min atrophy of left gastroc- he rec tonic water & neurology eval> he saw DrWillis 3/12 (note reviewed)> NCV was normal; EMG c/w mild S1 radiculopathy; he rec MRI- pending & to consider Zanaflex or Baclofen Rx...    Anxiety/ Insomnia>  He has  Chlorazepate for Prn use, and Ambien to use for sleep Qhs...  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    Hx CP>  Prev cardiac w/u by DrCrenshaw was neg, no recurrent CP complaints, he remains active, etc...    Chol>  On Crestor5 now; he stopped Simva40 due to muscle cramps which improved/ resolved off the med; went to Lipid clinic & they tried Cres5- tol well so far!  FLP today shows TChol 114, TG 40, HDL 36, LDL 70, and LFTs are normal...    Hypothy>  On Synthroid 179mcg/d now>  Labs 4/12 revealed TSH= 0.33 on Synthroid125 so we  decr the dose slightly to 112.[name]    GI>  Followed by [facility] & had colonoscopy 3/12 at [facility]- neg x for hems & DrM rec f/u 86yrs...    GU>  Hx BPH w/ BOO & followed by [id number] on Rapaflo 8mg /d;  PSA done 4/12 = 1.22...    Ortho>  eval 12/11 by DrNorris for bilat leg pain & cramping, ?min atrophy of left gastroc- he rec tonic water & neurology eval> he saw DrWillis 3/12 (note reviewed)> NCV was normal; EMG c/w mild S1 radiculopathy; he rec MRI- pending (we did not get f/u note);  Now c/o some right arm/ shoulder symptoms & I have rec eval by DrSypher to sort this out...    Anxiety/ Insomnia>  He has Chlorazepate for Prn use, and Ambien to use for sleep Qhs...  ~  March 27, 2012:  22mo [name] & Gregory [name] is stable- no new complaints or concerns... He continues to f/u w/ the Lipid Clinic & is taking Crestor 5mg /d (see labs below)... 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    Hx CP>  Prev cardiac w/u by DrCrenshaw was neg, no recurrent CP complaints, he remains active, etc...    Chol>  On Crestor5 now; he stopped Simva40 due to muscle cramps which improved/ resolved off the med; went to Lipid clinic & they tried Cres5- tol well so far!  FLP last= 2/13 & showed TChol 124, TG 21, HDL 40, LDL 80    Hypothy>  On Synthroid 116mcg/d now>  Labs 4/12 revealed TSH= 0.33 on Synthroid125 so we decr the dose slightly to 112.[name]    GI>  Followed by [facility] & had colonoscopy 3/12 at [facility]- neg x for hems & DrM rec f/u 18yrs...    GU>  Hx BPH w/ BOO & followed by [id number] on Rapaflo 8mg /d;  PSA done 4/12 = 1.22...    Ortho>  eval 12/11 by DrNorris for bilat leg pain & cramping, ?min atrophy of left gastroc- he rec tonic water & neurology eval> he saw DrWillis 3/12 (note reviewed)> NCV was normal; EMG c/w mild S1 radiculopathy; he rec MRI- pending (we did not get f/u note);  Now c/o some right arm/ shoulder symptoms & I have rec eval by DrSypher to sort this out...    Anxiety/ Insomnia>  He has Chlorazepate for Prn use, and Ambien to use for sleep Qhs... We reviewed prob list, meds, xrays and labs> see below for updates >> LABS 4/13:  Chems- wnl;  CBC- wnl;  TSH=4.60;  PSA=1.31;  UA- clear  ~  September 27, 2012:  22mo [name] & [name]'s CC= chronic cough, mostly dry, some congestion, little phlegm/ no blood/ etc; he saw TP 8/13 & given Hydromet/ Delsym/ Mobic/ Lozenges & these helped but prob is persistent;  Chest is clear, labs 4/13 normal, CXR 8/13 stable;  We reviewed  options and decided to try Advair-HFA 115/21- 2puffsBid plus cough drops etc...     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    We reviewed prob list, meds, xrays and labs> see below for updates >> OK Flu shot today...          Problem List:   OTHER DISEASES OF NASAL CAVITY AND SINUSES - Hx of Osler-Weber-Rendue syndrome/ hereditary telangiectasias... he's had numerous nose bleeds and surgery by Mikael Spray, now followed by [facility]... left nares is occluded w/ skin graft- no lumen... this is quite uncomfortable to the patient but he's been told there is nothing further that can be done for this... ~  3/11:  notes occas nosebleeds but he is able to handle them... ~  4/12:  No diff in his pattern of mild nose bleeds & he denies any severe epistaxis episodes... 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 known GI telangiectasia as well without signif GI bleeding in the past- followed by [facility]... he's also has skin telangiectasis w/ prev laser therapy at [facility]... extensive nasal problems as above... ~  labs 2/09 showed Hg= 17.0 ~  labs 2/10 showed Hg= 16.4 ~  CXR 3/11 is chr incr markings esp RLL- no change, NAD...  ~  Labs 6/11 showed Hg= 15.9 ~  Labs 4/12 showed Hg= 16.0 ~  CXR 10/12 showed mild biapical scarring, RLL AVM, NAD... ~  Labs 4/13 showed Hg=  15.2 ~  CXR 8/13 showed heart at upper lim of norm, increased markings & apical scarring, chr incr markings in RLL- AVM, NAD...  Hx of CHEST PAIN (ICD-786.50) - seen Sep09 w/ several recent bouts of CP while working in yard, washing car, etc... resolved w/ rest & Protonix... EKG showed WNL, prev [facility]Test 1/04 at [facility] was neg (no ischemia or infarction and EF= 61%)... Cardiac eval DrCrenshaw w/ 2DEcho 10/09 showing norm LV- no regional wall motion abn, norm LVF w/ EF= 60%, mild dil RA/ RV;  and norm MYOVIEW (no ischemia or infarction, EF= 58%)...  HYPERCHOLESTEROLEMIA, MILD (ICD-272.0) - now on CRESTOR 5mg /d>  Prev Rx w/ Simva40 but developed muscle cramping which resolved off the med. ~  FLP 6/08 showed TChol 208, TG 75, HDL 34, LDL 139... he preferred diet Rx- ~  FLP 2/09 showed TChol 172, TG 69, HDL 30, LDL 129... he agreed to Crestor10... ~  FLP 8/09 on Crestor10 showed TChol 91, TG 49, HDL 28, LDL 54... rec- keep same. ~  FLP 2/10 on Crestor10 showed TChol 108, TG 56, HDL 31, LDL 66 ~  9/10:  we discussed changing Crestor10 to Simvastatin40 for $$$ reasons... ~  FLP 3/11 on Simva40 showed TChol 105, TG 59, HDL 39, LDL 54... continue same. ~  FLP 4/12 on Simva40 showed TChol 104, TG 62, HDL 29, LDL 63... C/o severe muscle cramps & wants to stop for 26mo> cramping resolved! ~  Referred to Lipid Clinic & they restarted his Crestor at 5mg /d> tol well so far... ~  FLP 10/12 on Cres5 showed TChol 114,  TG 40, HDL 36, LDL 70 ~  FLP 2/13 on Cres5 showed TChol 124, TG 21, HDL 40, LDL 80  HYPOTHYROIDISM - on SYNTHROID 137mcg/d now; prev 191mcg/d dose was decreased 4/12... ~  labs 2/09 showed TSH= 0.69...  8/09 showed TSH= 0.44... ~  labs 2/10 showed TSH= 0.68 ~  labs 3/11 showed TSH= 0.52 ~  Labs 4/12 on Synthroid125 showed TSH= 0.33... We decided to decr the dose to 112/d. ~  Labs 4/13 on Levo112 showed TSH= 4.60  GERD, IRRITABLE BOWEL SYNDROME, COLONIC POLYPS - followed by [facility]...  prev on Protonix, he changed to OTC Zantac 75mg  on his own... also takes  LIBRAX Prn... colonoscopy 1/04 w/ divertics, diminutive rectal polyp (adenomatous), hems... f/u colon 1/07 showed no recurrent polyps... ~  colonoscopy 3/12 at [facility]- neg x for hems & [facility] rec f/u 67yrs...  HYPERTROPHY PROSTATE W/UR OBST & OTH LUTS (ICD-600.01) - followed by WGNFAOZH on RAPAFLO 8mg /d (stopped Flomax due to side effect- ED)... doing well without LTOS, just complains of nocturia x 1-2 ~  labs 2/09 showed PSA= 0.76 ~  labs 2/10 showed PSA= 0.90 ~  2011 PSA checked by DrDavis (pt states it was OK)... ~  Labs 4/12 showed PSA= 1.22 ~  Labs 4/13 showed PSA= 1.31 ~  10/13: presents c/o incr freq, nocturia x2-4, 7 weak stream despite Rapaflo; he is intol to Flomax; refer to Urology for further eval...  LEFT ARM & SHOULDER PAIN >> he was checked by Alinda Sierras but not improving he says; we discussed second opinion at the [facility], DrSypher==> notes reviewed.  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 ~  9/10:  requesting Rx for Robaxin for muscle spasm as needed.  PLANTAR FASCIITIS (ICD-728.71) - c/o pain in his feet secondary to plantar fasciitis eval & Rx from [facility] w/ Celebrex & shots... ~  9/10: we discussed change to [facility] - symptoms improved.  Hx of DIZZINESS (ICD-780.4) - sudden episode dizziness Sep09 working at [employer] around [employer] w/ room spinning- lasted and resolved spontaneously, felt light headed afterwards... no LOC, syncope, seizure activity, etc... no CP/ palpit/ SOB/ etc... there were several EMTs in the restaurant and they checked him out- stable and transported to ER... exam was neg-  they did labs: all norm, didn't do scans (can't get MRI due to metallic clamp in sinus), he can't take ASA due to bleeding from his OWR.[name]. treated w/ MECLIZINE w/ improvement.  ANXIETY (ICD-300.00) & INSOMNIA, CHRONIC  (ICD-307.42) - he uses CHLORAZEPATE 7.5mg  Prn & AMBIEN CR 12.5mg Penni Homans for chronic persistant insomnia...  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  Health Maintenance: ~  GI:  followed by [facility] & up to date on colon etc... (last 1/07 & 49yr f/u due 1/12). ~  GU:  followed by [facility] in past; PSAs have all been wnl... ~  Immunizations:  he getsyearly Flu vaccinations in the fall;  OK Pneumovax 03/12/10 at age48 80;     Past Surgical History  Procedure Date  . Varicocelectomy 1991    Dr. Gaynelle Arabian  . 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 Other surgical history     pulse laser for facial telangiectasias inthe past  . Excision left wrist ganglian/ myxoid cyst 05-30-2009  . Cardiovascular  stress test 01-14-2003    NO ISCHEMIA / EF 61%/ NORMAL LE WALL MOTION  . Transthoracic echocardiogram 10-17-2008   DR CRENSHAW    NORMAL LVF/ EF 60%/  MILDLY DILATED RIGHT ATRIUM/ VENTRICULE    Outpatient Encounter Prescriptions as of 09/27/2012  Medication Sig Dispense Refill  . acetaminophen (TYLENOL) 500 MG tablet Take 500 mg by mouth as needed. Rotates with ibuprofen/naproxen. Does not take all three at one time.       . clidinium-chlordiazePOXIDE (LIBRAX) 2.5-5 MG per capsule TAKE ONE CAPSULE BY MOUTH 3 TIMES A DAY AS NEEDED FOR ABDOMINAL CRAMPING  90 capsule  2  . clorazepate (TRANXENE) 7.5 MG tablet Take 1 tablet (7.5 mg total) by mouth 3 (three) times daily as needed.  90 tablet  5  . CRESTOR 5 MG tablet TAKE 1 TABLET BY MOUTH EVERY DAY  30 tablet  11  . Diphenhyd-Hydrocort-Nystatin (FIRST-DUKES MOUTHWASH) SUSP garlge and swallow 1 tsp four times daily as needed  120 mL  5  . Levothyroxine Sodium 125 MCG CAPS Take 1 capsule by mouth daily.      [name] loratadine (CLARITIN) 10 MG tablet Take 1 tablet (10 mg total) by mouth daily. As needed for  allergies   30 tablet  2  . meclizine (ANTIVERT) 25 MG tablet Take 1/2 tablet to 1 whole tablet by mouth 4 times daily as directed for dizziness  90 tablet  11  . meloxicam (MOBIC) 7.5 MG tablet TAKE 1 TABLET BY MOUTH EVERY DAY AS NEEDED FOR ARTHRITIS PAIN  30 tablet  4  . Multiple Vitamins-Minerals (CENTRUM) tablet Take 1 tablet by mouth daily.        . naproxen sodium (ANAPROX) 220 MG tablet Take 220 mg by mouth as needed. Rotates with ibuprofen/acetaminophen. Does not take at same time as others.       . pantoprazole (PROTONIX) 40 MG tablet Take 40 mg by mouth every other day.      . silodosin (RAPAFLO) 8 MG CAPS capsule Take 1 capsule (8 mg total) by mouth daily.  30 capsule  11  . zolpidem (AMBIEN) 10 MG tablet Take 1 tablet (10 mg total) by mouth at bedtime as needed.  30 tablet  5  . DISCONTD: levothyroxine (SYNTHROID, LEVOTHROID) 112 MCG tablet TAKE 1 TABLET BY MOUTH EVERY DAY  30 tablet  11    Allergies  Allergen Reactions  . Zocor (Simvastatin - High Dose)     Leg cramps     Current Medications, Allergies, Past Medical History, Past Surgical History, Family History, and Social History were reviewed in [employer] record.    Review of Systems       See HPI - other systems neg except as noted... The patient denies anorexia, fever, weight loss, weight gain, vision loss, decreased hearing, hoarseness, chest pain, syncope, dyspnea on exertion, peripheral edema, prolonged cough, headaches, hemoptysis, abdominal pain, melena, hematochezia, severe indigestion/heartburn, hematuria, incontinence, muscle weakness, suspicious skin lesions, transient blindness, difficulty walking, depression, unusual weight change, abnormal bleeding, enlarged lymph nodes, and angioedema.     Objective:   Physical Exam      WD, WN, 71 y/o WM in NAD... GENERAL:  Alert & oriented; pleasant & cooperative... HEENT:  [place]/AT, EOM-wnl, PERRLA, EACs-clear, TMs-wnl, NOSE- occluded left nares  from skin grafts, MOUTH- mucosal telangiectasias.  NECK:  Supple w/ fairROM; no JVD; normal carotid impulses w/o bruits; no thyromegaly or nodules palpated; no lymphadenopathy. CHEST:  Clear to P & A; without  wheezes/ rales/ or rhonchi. HEART:  Regular Rhythm; without murmurs/ rubs/ or gallops. ABDOMEN:  Soft & nontender; normal bowel sounds; no organomegaly or masses detected. EXT: without deformities, mild arthritic changes; no varicose veins/ venous insuffic/ or edema. NEURO:  CN's intact; motor testing normal; sensory testing normal; gait normal & balance OK. DERM:  mult telangiectasias...  RADIOLOGY DATA:  Reviewed in the EPIC EMR & discussed w/ the patient...  LABORATORY DATA:  Reviewed in the EPIC EMR & discussed w/ the patient...   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 CXR w/o change in the RLL AVM.     Hx CP>  Prev cardiac w/u by DrCrenshaw was neg, no recurrent CP complaints, he remains active, etc...     Chol>  On Crestor5 now; he stopped Simva40 due to muscle cramps which improved/ resolved off the med; went to Lipid clinic & they tried Cres5- tol well so far!  FLP today shows TChol 114, TG 40, HDL 36, LDL 70, and LFTs are normal...     Hypothy>  On Synthroid 140mcg/d now>  Labs 4/12 revealed TSH= 0.33 on Synthroid125 so we decr the dose slightly to 112.[name]     GI>  Followed by [facility] & had colonoscopy 3/12 at [facility]- neg x for hems & DrM rec f/u 66yrs...     GU>  Hx BPH w/ BOO & followed by FAOZHYQM on Rapaflo 8mg /d;  PSA done 4/12 = 1.22...     Ortho>  eval 12/11 by  DrNorris for bilat leg pain & cramping, ?min atrophy of left gastroc- he rec tonic water & neurology eval> he saw DrWillis 3/12 (note reviewed)> NCV was normal; EMG c/w mild S1 radiculopathy; he rec MRI- pending (we did not get f/u note);  Now c/o some right arm/ shoulder symptoms & I have rec eval by DrSypher to sort this out...     Anxiety/ Insomnia>  He has Chlorazepate for Prn use, and Ambien to use for sleep Qhs...   Patient's Medications  New Prescriptions   FLUTICASONE-SALMETEROL (ADVAIR HFA) 115-21 MCG/ACT INHALER    Inhale 2 puffs into the lungs 2 (two) times daily. Rinse mouth after use  Previous Medications   ACETAMINOPHEN (TYLENOL) 500 MG TABLET    Take 500 mg by mouth as needed. Rotates with ibuprofen/naproxen. Does not take all three at one time.    CLIDINIUM-CHLORDIAZEPOXIDE (LIBRAX) 2.5-5 MG PER CAPSULE    TAKE ONE CAPSULE BY MOUTH 3 TIMES A DAY AS NEEDED FOR ABDOMINAL CRAMPING   CLORAZEPATE (TRANXENE) 7.5 MG TABLET    Take 1 tablet (7.5 mg total) by mouth 3 (three) times daily as needed.   CRESTOR 5 MG TABLET    TAKE 1 TABLET BY MOUTH EVERY DAY   DIPHENHYD-HYDROCORT-NYSTATIN (FIRST-DUKES MOUTHWASH) SUSP    garlge and swallow 1 tsp four times daily as needed   LEVOTHYROXINE SODIUM 125 MCG CAPS    Take 1 capsule by mouth daily.   LORATADINE (CLARITIN) 10 MG TABLET    Take 1 tablet (10 mg total) by mouth daily. As needed for allergies    MECLIZINE (ANTIVERT)  25 MG TABLET    Take 1/2 tablet to 1 whole tablet by mouth 4 times daily as directed for dizziness   MELOXICAM (MOBIC) 7.5 MG TABLET    TAKE 1 TABLET BY MOUTH EVERY DAY AS NEEDED FOR ARTHRITIS PAIN   MULTIPLE VITAMINS-MINERALS (CENTRUM) TABLET    Take 1 tablet by mouth daily.     NAPROXEN SODIUM (ANAPROX) 220 MG TABLET    Take 220 mg by mouth as needed. Rotates with ibuprofen/acetaminophen. Does not take at same time as others.    PANTOPRAZOLE (PROTONIX) 40 MG TABLET    Take 40 mg by mouth every other day.   SILODOSIN  (RAPAFLO) 8 MG CAPS CAPSULE    Take 1 capsule (8 mg total) by mouth daily.   ZOLPIDEM (AMBIEN) 10 MG TABLET    Take 1 tablet (10 mg total) by mouth at bedtime as needed.  Modified Medications   No medications on file  Discontinued Medications   LEVOTHYROXINE (SYNTHROID, LEVOTHROID) 112 MCG TABLET    TAKE 1 TABLET BY MOUTH EVERY DAY

## 2012-10-19 ENCOUNTER — Other Ambulatory Visit: Payer: Self-pay | Admitting: Pulmonary Disease

## 2012-10-20 HISTORY — PX: OTHER SURGICAL HISTORY: SHX169

## 2012-10-31 ENCOUNTER — Other Ambulatory Visit: Payer: Self-pay | Admitting: Pulmonary Disease

## 2012-11-02 ENCOUNTER — Telehealth: Payer: Self-pay | Admitting: Pulmonary Disease

## 2012-11-02 NOTE — Telephone Encounter (Signed)
I spoke with pt and he stated he needed his movic sent to his pharmacy to have on hold. i advised pt rx was sent 10/31/12. He voiced his understanding and needed nothing further

## 2012-11-14 ENCOUNTER — Other Ambulatory Visit: Payer: Self-pay | Admitting: Pulmonary Disease

## 2012-12-26 ENCOUNTER — Other Ambulatory Visit: Payer: Self-pay | Admitting: Pulmonary Disease

## 2013-01-08 ENCOUNTER — Other Ambulatory Visit: Payer: Self-pay | Admitting: Pulmonary Disease

## 2013-01-12 ENCOUNTER — Other Ambulatory Visit: Payer: Self-pay | Admitting: Pulmonary Disease

## 2013-01-18 ENCOUNTER — Ambulatory Visit (INDEPENDENT_AMBULATORY_CARE_PROVIDER_SITE_OTHER): Payer: Medicare Other | Admitting: Adult Health

## 2013-01-18 ENCOUNTER — Encounter: Payer: Self-pay | Admitting: Adult Health

## 2013-01-18 VITALS — BP 126/74 | HR 64 | Temp 97.4°F | Ht 70.0 in | Wt 187.4 lb

## 2013-01-18 DIAGNOSIS — J40 Bronchitis, not specified as acute or chronic: Secondary | ICD-10-CM

## 2013-01-18 MED ORDER — AZITHROMYCIN 250 MG PO TABS
ORAL_TABLET | ORAL | Status: AC
Start: 2013-01-18 — End: 2013-01-23

## 2013-01-18 MED ORDER — HYDROCODONE-HOMATROPINE 5-1.5 MG/5ML PO SYRP: 5.0000 mL | ORAL_SOLUTION | Freq: Four times a day (QID) | ORAL | Status: AC | PRN

## 2013-01-18 NOTE — Patient Instructions (Addendum)
Zpack take as directed.  Mucinex Twice daily  As needed  Cough/congestion  Delsym 2 tsp every 12 hr As needed  Cough  Hydromet 1-2 tsp every 4-6 hr As needed  For cough , may make you sleepy. Fluids and rest  Saline nasal rinses and gel As needed   Please contact office for sooner follow up if symptoms do not improve or worsen or seek emergency care

## 2013-01-18 NOTE — Progress Notes (Signed)
Subjective:    Patient ID: Gregory [name], male    DOB: [birth year], 72 y.o.   MRN: [id number]  HPI 72 y/o WM - he has multiple medical problems including:  Hx OWR syndrome;  Hx atypCP;  Hyperchol;  Hypothyroidism;  GERD/ Divertics/ IBS/ Colon polyps;  BPH/ BOO;  DJD/ LBP/ Plantar fasciitis;  Hx dizziness;  Anxiety & chronic persistant insomnia...  01/18/2013 Acute OV  Complains of dry cough rarely producing white/yellow mucus, wheezing, increased SOB x2 weeks - denies tightness, f/c/s.  hydromet does help some.  No sinus drainage . No hemoptysis .  No discolored mucus or fever.  Last cxr was 07/2012  - chronic changes  Using robitussum without much help. Cough keeping her up at night .            Problem List:   OTHER DISEASES OF NASAL CAVITY AND SINUSES - Hx of Osler-Weber-Rendue syndrome/ hereditary telangiectasias... he's had numerous nose bleeds and surgery by Mikael Spray, now followed by Gayla Medicus... left nares is occluded w/ skin graft- no lumen... this is quite uncomfortable to the patient but he's been told there is nothing further that can be done for this... ~  3/11:  notes occas nosebleeds but he is able to handle them...  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 known GI telangiectasia as well without signif GI bleeding in the past- followed by [facility]... he's also has skin telangiectasis w/ prev laser therapy at [facility]... extensive nasal problems as above... ~  labs 2/09 showed Hg= 17.0 ~  labs 2/10 showed Hg= 16.4 ~  CXR 3/11 is chr incr markings esp RLL- no change, NAD...  ~  Labs 6/11 showed Hg= 15.9 ~  Labs 4/12 showed Hg= 16.0 ~  CXR 10/12 showed mild biapical scarring, RLL AVM, NAD... ~  Labs 4/13 showed Hg= 15.2  Hx of CHEST PAIN (ICD-786.50) - seen Sep09 w/ several recent bouts of CP while working in yard, washing car, etc... resolved w/ rest &  Protonix... EKG showed WNL, prev [facility]St[facility]Test 1/04 at [facility] was neg (no ischemia or infarction and EF= 61%)... Cardiac eval DrCrenshaw w/ 2DEcho 10/09 showing norm LV- no regional wall motion abn, norm LVF w/ EF= 60%, mild dil RA/ RV;  and norm MYOVIEW (no ischemia or infarction, EF= 58%)...  HYPERCHOLESTEROLEMIA, MILD (ICD-272.0) - now on CRESTOR 5mg /d>  Prev Rx w/ Simva40 but developed muscle cramping which resolved off the med. ~  FLP 6/08 showed TChol 208, TG 75, HDL 34, LDL 139... he preferred diet Rx- ~  FLP 2/09 showed TChol 172, TG 69, HDL 30, LDL 129... he agreed to Crestor10... ~  FLP 8/09 on Crestor10 showed TChol 91, TG 49, HDL 28, LDL 54... rec- keep same. ~  FLP 2/10 on Crestor10 showed TChol 108, TG 56, HDL 31, LDL 66 ~  9/10:  we discussed changing Crestor10 to Simvastatin40 for $$$ reasons... ~  FLP 3/11 on Simva40 showed TChol 105, TG 59, HDL 39, LDL 54... continue same. ~  FLP 4/12 on Simva40 showed TChol 104, TG 62, HDL 29, LDL 63... C/o severe muscle cramps & wants to stop for 40mo> cramping resolved! ~  [facility] & they restarted his Crestor at 5mg /d> tol well so far... ~  FLP 10/12 on Cres5 showed TChol 114, TG 40, HDL 36, LDL 70 ~  FLP 2/13 on Cres5 showed TChol 124, TG  21, HDL 40, LDL 80  HYPOTHYROIDISM - on SYNTHROID 182mcg/d now; prev 13mcg/d dose was decreased 4/12... ~  labs 2/09 showed TSH= 0.69...  8/09 showed TSH= 0.44... ~  labs 2/10 showed TSH= 0.68 ~  labs 3/11 showed TSH= 0.52 ~  Labs 4/12 on Synthroid125 showed TSH= 0.33... We decided to decr the dose to 112/d. ~  Labs 4/13 on Levo112 showed TSH= 4.60  GERD, IRRITABLE BOWEL SYNDROME, COLONIC POLYPS - followed by [facility]... prev on Protonix, he changed to OTC Zantac 75mg  on his own... also takes  LIBRAX Prn... colonoscopy 1/04 w/ divertics, diminutive rectal polyp (adenomatous), hems... f/u colon 1/07 showed no recurrent polyps... ~  colonoscopy 3/12 at [facility]- neg x for  hems & [facility] rec f/u 60yrs...  HYPERTROPHY PROSTATE W/UR OBST & OTH LUTS (ICD-600.01) - followed by ZOXWRUEA on RAPAFLO 8mg /d (stopped Flomax due to side effect- ED)... doing well without LTOS, just complains of nocturia x 1-2 ~  labs 2/09 showed PSA= 0.76 ~  labs 2/10 showed PSA= 0.90 ~  2011 PSA checked by DrDavis (pt states it was OK)... ~  Labs 4/12 showed PSA= 1.22 ~  Labs 4/13 showed PSA= 1.31  LEFT ARM & SHOULDER PAIN >> he was checked by Alinda Sierras but not improving he says; we discussed second opinion at the [facility], DrSypher==> notes reviewed.  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 ~  9/10:  requesting Rx for Robaxin for muscle spasm as needed.  PLANTAR FASCIITIS (ICD-728.71) - c/o pain in his feet secondary to plantar fasciitis eval & Rx from [facility] w/ Celebrex & shots... ~  9/10: we discussed change to [facility] - symptoms improved.  Hx of DIZZINESS (ICD-780.4) - sudden episode dizziness Sep09 working at [employer] around [employer] w/ room spinning- lasted and resolved spontaneously, felt light headed afterwards... no LOC, syncope, seizure activity, etc... no CP/ palpit/ SOB/ etc... there were several EMTs in the restaurant and they checked him out- stable and transported to ER... exam was neg-  they did labs: all norm, didn't do scans (can't get MRI due to metallic clamp in sinus), he can't take ASA due to bleeding from his OWR.[name]. treated w/ MECLIZINE w/ improvement.  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  Health Maintenance: ~  GI:  followed by [facility] & up to date on colon etc... (last 1/07 & 64yr f/u due 1/12). ~  GU:  followed by DrRDavis, and he did his PSA this yr. ~  Immunizations:  he getsyearly Flu vaccinations in the fall;  OK  Pneumovax 03/12/10 at age[age];     Past Surgical History  Procedure Date  . Varicocelectomy 1991    Dr. Gaynelle Arabian  . 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 Other surgical history     pulse laser for facial telangiectasias inthe past  . Excision left wrist ganglian/ myxoid cyst 05-30-2009  . Cardiovascular stress test 01-14-2003    NO ISCHEMIA / EF 61%/ NORMAL LE WALL MOTION  . Transthoracic echocardiogram 10-17-2008   DR CRENSHAW    NORMAL LVF/ EF 60%/  MILDLY DILATED RIGHT ATRIUM/ VENTRICULE    Outpatient Encounter Prescriptions as of 01/18/2013  Medication Sig Dispense Refill  . acetaminophen (TYLENOL) 500  MG tablet Take 500 mg by mouth as needed. Rotates with ibuprofen/naproxen. Does not take all three at one time.       . clidinium-chlordiazePOXIDE (LIBRAX) 2.5-5 MG per capsule TAKE ONE CAPSULE BY MOUTH 3 TIMES A DAY AS NEEDED FOR ABDOMINAL CRAMPING  90 capsule  1  . clorazepate (TRANXENE) 7.5 MG tablet Take 1 tablet (7.5 mg total) by mouth 3 (three) times daily as needed.  90 tablet  5  . CRESTOR 5 MG tablet TAKE 1 TABLET BY MOUTH EVERY DAY  30 tablet  11  . dextromethorphan-guaiFENesin (MUCINEX DM) 30-600 MG per 12 hr tablet Take 1-2 tablets by mouth every 12 (twelve) hours as needed.      . Diphenhyd-Hydrocort-Nystatin (FIRST-DUKES MOUTHWASH) SUSP garlge and swallow 1 tsp four times daily as needed  120 mL  5  . fluticasone-salmeterol (ADVAIR HFA) 115-21 MCG/ACT inhaler Inhale 2 puffs into the lungs 2 (two) times daily. Rinse mouth after use  1 Inhaler  12  . HYDROcodone-homatropine (HYCODAN) 5-1.5 MG/5ML syrup Take 5 mLs by mouth every 6 (six) hours as needed.      . Levothyroxine Sodium 125 MCG CAPS Take 1 capsule by mouth daily.      [name] loratadine (CLARITIN) 10 MG tablet Take 1 tablet (10 mg total) by mouth daily. As needed for allergies   30 tablet  2  . meclizine (ANTIVERT) 25 MG tablet Take 1/2 tablet to 1 whole tablet by mouth  4 times daily as directed for dizziness  90 tablet  11  . meloxicam (MOBIC) 7.5 MG tablet TAKE 1 TABLET BY MOUTH ONCE DAILY AS NEEDED  30 tablet  6  . Multiple Vitamins-Minerals (CENTRUM) tablet Take 1 tablet by mouth daily.        . naproxen sodium (ANAPROX) 220 MG tablet Take 220 mg by mouth as needed. Rotates with ibuprofen/acetaminophen. Does not take at same time as others.       . nystatin (MYCOSTATIN) 100000 UNIT/ML suspension GARGLE AND SWALLOW 1 TEASPOON 4 TIMES A DAY AS NEEDED  120 mL  1  . pantoprazole (PROTONIX) 40 MG tablet Take 40 mg by mouth every other day.      [name] RAPAFLO 8 MG CAPS capsule TAKE ONE CAPSULE BY MOUTH EVERY DAY  30 capsule  6  . zolpidem (AMBIEN) 10 MG tablet TAKE 1 TABLET BY MOUTH AT BEDTIME AS NEEDED  30 tablet  5    Allergies  Allergen Reactions  . Zocor (Simvastatin - High Dose)     Leg cramps     Current Medications, Allergies, Past Medical History, Past Surgical History, Family History, and Social History were reviewed in [employer] record.    Review of Systems       Constitutional:   No  weight loss, night sweats,  Fevers, chills, fatigue, or  lassitude.  HEENT:   No headaches,  Difficulty swallowing,  Tooth/dental problems, or  Sore throat,                No sneezing, itching, ear ache, + nasal congestion, post nasal drip,   CV:  No chest pain,  Orthopnea, PND, swelling in lower extremities, anasarca, dizziness, palpitations, syncope.   GI  No heartburn, indigestion, abdominal pain, nausea, vomiting, diarrhea, change in bowel habits, loss of appetite, bloody stools.   Resp:     No coughing up of blood.  [name]  No chest wall deformity  Skin: no rash or lesions.  GU: no dysuria,  change in color of urine, no urgency or frequency.  No flank pain, no hematuria   MS:  No joint pain or swelling.  No decreased range of motion.  No back pain.  Psych:  No change in mood or affect. No depression or anxiety.  No memory  loss.       Objective:   Physical Exam      WD, WN, 72 y/o WM in NAD... GENERAL:  Alert & oriented; pleasant & cooperative... HEENT:  [place]/AT, EACs-clear, TMs-wnl, NOSE- occluded left nares from skin grafts, MOUTH- mucosal telangiectasias.  NECK:  Supple w/ fairROM; no JVD; normal carotid impulses w/o bruits; no thyromegaly or nodules palpated; no lymphadenopathy. CHEST:  Few scattered rhonchi  HEART:  Regular Rhythm; without murmurs/ rubs/ or gallops. ABDOMEN:  Soft & nontender; normal bowel sounds; no organomegaly or masses detected. EXT: without deformities, mild arthritic changes; no varicose veins/ venous insuffic/ or edema. NEURO:   gait normal & balance OK. DERM:  mult telangiectasias...     Assessment & Plan:

## 2013-01-18 NOTE — Assessment & Plan Note (Signed)
Flare   Plan  Zpack take as directed.  Mucinex Twice daily  As needed  Cough/congestion  Delsym 2 tsp every 12 hr As needed  Cough  Hydromet 1-2 tsp every 4-6 hr As needed  For cough , may make you sleepy. Fluids and rest  Saline nasal rinses and gel As needed   Please contact office for sooner follow up if symptoms do not improve or worsen or seek emergency care

## 2013-01-23 ENCOUNTER — Other Ambulatory Visit: Payer: Self-pay | Admitting: Pulmonary Disease

## 2013-01-23 ENCOUNTER — Other Ambulatory Visit: Payer: Self-pay | Admitting: *Deleted

## 2013-01-23 MED ORDER — FIRST-DUKES MOUTHWASH MT SUSP: OROMUCOSAL | Status: DC

## 2013-03-28 ENCOUNTER — Encounter: Payer: Self-pay | Admitting: Pulmonary Disease

## 2013-03-28 ENCOUNTER — Ambulatory Visit (INDEPENDENT_AMBULATORY_CARE_PROVIDER_SITE_OTHER): Payer: Medicare Other | Admitting: Pulmonary Disease

## 2013-03-28 ENCOUNTER — Other Ambulatory Visit (INDEPENDENT_AMBULATORY_CARE_PROVIDER_SITE_OTHER): Payer: Medicare Other

## 2013-03-28 VITALS — BP 110/68 | HR 62 | Temp 96.6°F | Ht 70.0 in | Wt 187.0 lb

## 2013-03-28 DIAGNOSIS — D126 Benign neoplasm of colon, unspecified: Secondary | ICD-10-CM

## 2013-03-28 DIAGNOSIS — M545 Low back pain, unspecified: Secondary | ICD-10-CM

## 2013-03-28 DIAGNOSIS — K219 Gastro-esophageal reflux disease without esophagitis: Secondary | ICD-10-CM

## 2013-03-28 DIAGNOSIS — N139 Obstructive and reflux uropathy, unspecified: Secondary | ICD-10-CM

## 2013-03-28 DIAGNOSIS — E559 Vitamin D deficiency, unspecified: Secondary | ICD-10-CM

## 2013-03-28 DIAGNOSIS — E7800 Pure hypercholesterolemia, unspecified: Secondary | ICD-10-CM

## 2013-03-28 DIAGNOSIS — F411 Generalized anxiety disorder: Secondary | ICD-10-CM

## 2013-03-28 DIAGNOSIS — E039 Hypothyroidism, unspecified: Secondary | ICD-10-CM

## 2013-03-28 DIAGNOSIS — K589 Irritable bowel syndrome without diarrhea: Secondary | ICD-10-CM

## 2013-03-28 DIAGNOSIS — I780 Hereditary hemorrhagic telangiectasia: Secondary | ICD-10-CM

## 2013-03-28 DIAGNOSIS — N401 Enlarged prostate with lower urinary tract symptoms: Secondary | ICD-10-CM

## 2013-03-28 DIAGNOSIS — R42 Dizziness and giddiness: Secondary | ICD-10-CM

## 2013-03-28 DIAGNOSIS — N138 Other obstructive and reflux uropathy: Secondary | ICD-10-CM

## 2013-03-28 LAB — CBC WITH DIFFERENTIAL/PLATELET
Basophils Absolute: 0 10*3/uL (ref 0.0–0.1)
Eosinophils Absolute: 0.1 10*3/uL (ref 0.0–0.7)
HCT: 43.4 % (ref 39.0–52.0)
Lymphocytes Relative: 36.6 % (ref 12.0–46.0)
Lymphs Abs: 3.2 10*3/uL (ref 0.7–4.0)
MCHC: 33.4 g/dL (ref 30.0–36.0)
Monocytes Relative: 8.5 % (ref 3.0–12.0)
Platelets: 269 10*3/uL (ref 150.0–400.0)
RDW: 15 % — ABNORMAL HIGH (ref 11.5–14.6)

## 2013-03-28 LAB — BASIC METABOLIC PANEL
CO2: 29 mEq/L (ref 19–32)
Chloride: 101 mEq/L (ref 96–112)
Glucose, Bld: 86 mg/dL (ref 70–99)
Potassium: 4.9 mEq/L (ref 3.5–5.1)
Sodium: 137 mEq/L (ref 135–145)

## 2013-03-28 LAB — LIPID PANEL
Total CHOL/HDL Ratio: 3
VLDL: 8.8 mg/dL (ref 0.0–40.0)

## 2013-03-28 LAB — TSH: TSH: 5.46 u[IU]/mL (ref 0.35–5.50)

## 2013-03-28 LAB — HEPATIC FUNCTION PANEL
AST: 16 U/L (ref 0–37)
Albumin: 4.2 g/dL (ref 3.5–5.2)
Alkaline Phosphatase: 57 U/L (ref 39–117)
Total Protein: 7 g/dL (ref 6.0–8.3)

## 2013-03-28 MED ORDER — SILODOSIN 8 MG PO CAPS: 8.0000 mg | ORAL_CAPSULE | Freq: Every day | ORAL | Status: DC

## 2013-03-28 MED ORDER — PANTOPRAZOLE SODIUM 40 MG PO TBEC: 40.0000 mg | DELAYED_RELEASE_TABLET | ORAL | Status: DC

## 2013-03-28 MED ORDER — LEVOTHYROXINE SODIUM 112 MCG PO TABS: 112.0000 ug | ORAL_TABLET | Freq: Every day | ORAL | Status: DC

## 2013-03-28 MED ORDER — CLORAZEPATE DIPOTASSIUM 7.5 MG PO TABS: 7.5000 mg | ORAL_TABLET | Freq: Three times a day (TID) | ORAL | Status: DC | PRN

## 2013-03-28 NOTE — Patient Instructions (Addendum)
Today we updated your med list in our EPIC system...    Continue your current medications the same...  Today we did your follow up FASTING blood work...    We will contact you w/ the results when available...   Call for any questions...  Let's plan a follow up visit in 6mo, sooner if needed for problems...   

## 2013-03-28 NOTE — Progress Notes (Signed)
Subjective:    Patient ID: Gregory [name], male    DOB: 1941/09/10, 72 y.o.   MRN: [id number]  HPI 72 y/o WM here for a follow up visit... he has multiple medical problems including:  Hx OWR syndrome;  Hx atypCP;  Hyperchol;  Hypothyroidism;  GERD/ Divertics/ IBS/ Colon polyps;  BPH/ BOO;  DJD/ LBP/ Plantar fasciitis;  Hx dizziness;  Anxiety & chronic persistant insomnia...  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    Hx CP>  Prev cardiac w/u by DrCrenshaw was neg, no recurrent CP complaints, he remains active, etc...    Chol>  On Crestor5 now; he stopped Simva40 due to muscle cramps which improved/ resolved off the med; went to Lipid clinic & they tried Cres5- tol well so far!  FLP today shows TChol 114, TG 40, HDL 36, LDL 70, and LFTs are normal...    Hypothy>  On Synthroid 166mcg/d now>  Labs 4/12 revealed TSH= 0.33 on Synthroid125 so we decr the dose slightly to 112.[name]    GI>  Followed by [facility] & had colonoscopy 3/12 at [facility]- neg x for hems & DrM rec f/u 36yrs...    GU>  Hx BPH w/ BOO & followed by [id number] on Rapaflo 8mg /d;  PSA done 4/12 = 1.22...    Ortho>  eval 12/11 by DrNorris for bilat leg pain & cramping, ?min atrophy of left gastroc- he rec tonic water & neurology eval> he saw DrWillis 3/12 (note reviewed)> NCV was normal; EMG c/w mild S1 radiculopathy; he rec MRI- pending (we did not get f/u note);  Now c/o some right arm/ shoulder  symptoms & I have rec eval by DrSypher to sort this out...    Anxiety/ Insomnia>  He has Chlorazepate for Prn use, and Ambien to use for sleep Qhs...  ~  March 27, 2012:  62mo [name] & Gregory [name] is stable- no new complaints or concerns... He continues to f/u w/ the Lipid Clinic & is taking Crestor 5mg /d (see labs below)... 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    Hx CP>  Prev cardiac w/u by DrCrenshaw was neg, no recurrent CP complaints, he remains active, etc...    Chol>  On Crestor5 now; he stopped Simva40 due to muscle cramps which improved/ resolved off the med; went to Lipid clinic & they tried Cres5- tol well so far!  FLP last= 2/13 & showed TChol 124, TG 21, HDL 40, LDL 80    Hypothy>  On Synthroid  181mcg/d now>  Labs 4/12 revealed TSH= 0.33 on Synthroid125 so we decr the dose slightly to 112.[name]    GI>  Followed by [facility] & had colonoscopy 3/12 at [facility]- neg x for hems & DrM rec f/u 45yrs...    GU>  Hx BPH w/ BOO & followed by [id number] on Rapaflo 8mg /d;  PSA done 4/12 = 1.22...    Ortho>  eval 12/11 by DrNorris for bilat leg pain & cramping, ?min atrophy of left gastroc- he rec tonic water & neurology eval> he saw DrWillis 3/12 (note reviewed)> NCV was normal; EMG c/w mild S1 radiculopathy; he rec MRI- pending (we did not get f/u note);  Now c/o some right arm/ shoulder symptoms & I have rec eval by DrSypher to sort this out...    Anxiety/ Insomnia>  He has Chlorazepate for Prn use, and Ambien to use for sleep Qhs... We reviewed prob list, meds, xrays and labs> see below for updates >> LABS 4/13:  Chems- wnl;   CBC- wnl;  TSH=4.60;  PSA=1.31;  UA- clear  ~  September 27, 2012:  69mo [name] & Gregory [name]'s CC= chronic cough, mostly dry, some congestion, little phlegm/ no blood/ etc; he saw TP 8/13 & given Hydromet/ Delsym/ Mobic/ Lozenges & these helped but prob is persistent;  Chest is clear, labs 4/13 normal, CXR 8/13 stable;  We reviewed options and decided to try Advair-HFA 115/21- 2puffsBid plus cough drops etc...     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    We reviewed prob list, meds, xrays and labs> see below for updates >> OK Flu shot today...  ~  March 28, 2013:  69mo [name] & Gregory [name] has had several interval complaints> skin cancer removed from his forehead, cortisone shot in his knee, c/o dry throat & intermitt hoarse (Rx w/ lozenges, throat drops), he's been traveling w/ his job & doing ok w/o CP SOB etc...      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    Hx Cough> resolved w/ [id number] & just using prn now; + Hycodan, Claritin, Mucinex, MMW- c/o dry throat & encouraged to use lozenges; he had URI treated by TP 1/14 w/esolved...    Hx CP>  Prev cardiac w/u by DrCrenshaw was neg, no recurrent CP complaints, he remains active, etc...    Chol>  On Crestor5 now; he stopped Simva40 due to muscle cramps which resolved off this med; FLP 4/14 showed TChol 119, TG 44, HDL 38, LDL 72    Hypothy>  On Synthroid 155mcg/d now>  Labs 4/12 revealed TSH= 0.33 on  Synthroid125 so we decr the dose slightly to 112; f/u TSH= 4.60 & 5.46    GI- GERD, IBS, Polyps> on Protonix40Qod & Librax prn; followed by [facility] & had colonoscopy 3/12- neg x for hems & DrM rec f/u 74yrs...    GU>  Hx BPH w/ BOO & prev  followed by [id number] on Rapaflo 8mg /d;  He saw DrEskridge 12/13 who notes a slow PSAV c/wBPH & DRE was wnl...    Ortho>  On Mobic7.5 + OTC meds; eval 12/11 by DrNorris for bilat leg pain & cramping, ?min atrophy of left gastroc- he rec tonic water & neurology eval> he saw DrWillis 3/12 (note reviewed)> NCV was normal; EMG c/w mild S1 radiculopathy; he rec MRI- pending (we did not get f/u note);  Now c/o some right arm/ shoulder symptoms & I have rec eval by DrSypher to sort this out...    Anxiety/ Insomnia>  He has Chlorazepate7.5 for Prn use, and Ambien10 to use for sleep Qhs...  We reviewed prob list, meds, xrays and labs> see below for updates >>  LABS 4/14:  FLP- at goals on Cres5;  Chems- wnl;  CBC- wnl;  TSH=5.46 on Synthroid112;  VitD=50;  PSA=2.56             Problem List:   OTHER DISEASES OF NASAL CAVITY AND SINUSES - Hx of Osler-Weber-Rendue syndrome/ hereditary telangiectasias... he's had numerous nose bleeds and surgery by Mikael Spray, now followed by Gregory [name]... left nares is occluded w/ skin graft- no lumen... this is quite uncomfortable to the patient but he's been told there is nothing further that can be done for this... ~  3/11:  notes occas nosebleeds but he is able to handle them... ~  4/12:  No diff in his pattern of mild nose bleeds & he denies any severe epistaxis episodes... 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 ~  4/14:  He notes infreq bleeding now w/ saline etc...  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 known GI telangiectasia as well without signif GI  bleeding in the past- followed by [facility]... he's also has skin telangiectasis w/ prev laser therapy at [facility]... extensive nasal problems as above... ~  labs 2/09 showed Hg= 17.0 ~  labs 2/10 showed Hg= 16.4 ~  CXR 3/11 is chr incr markings esp RLL- no change, NAD...  ~  Labs 6/11 showed Hg= 15.9 ~  Labs 4/12 showed Hg= 16.0 ~  CXR 10/12 showed mild biapical scarring, RLL AVM, NAD... ~  Labs 4/13 showed Hg= 15.2 ~  CXR 8/13 showed heart at upper lim of norm, increased markings & apical scarring, chr incr markings in RLL- AVM, NAD...  Hx of CHEST PAIN (ICD-786.50) - seen Sep09 w/ several recent bouts of CP while working in yard, washing car, etc... resolved w/ rest & Protonix... EKG showed WNL, prev NuclearStressTest 1/04 at [facility] was neg (no ischemia or infarction and EF= 61%)... Cardiac eval DrCrenshaw w/ 2DEcho 10/09 showing norm LV- no regional wall motion abn, norm LVF w/ EF= 60%, mild dil RA/ RV;  and norm MYOVIEW (no ischemia or infarction, EF= 58%)...  HYPERCHOLESTEROLEMIA, MILD (ICD-272.0) - now on CRESTOR 5mg /d>  Prev Rx w/ Simva40 but developed muscle cramping which resolved off the med. ~  FLP 6/08 showed TChol 208, TG 75, HDL 34, LDL 139... he preferred diet Rx- ~  FLP 2/09 showed TChol 172, TG 69, HDL 30, LDL 129... he agreed to Crestor10... ~  FLP 8/09 on Crestor10 showed TChol 91, TG 49, HDL 28, LDL 54... rec- keep same. ~  FLP 2/10 on Crestor10 showed TChol 108, TG 56, HDL 31, LDL 66 ~  9/10:  we discussed changing Crestor10 to Simvastatin40 for $$$ reasons... ~  FLP 3/11 on Simva40 showed TChol 105, TG 59, HDL 39, LDL 54... continue same. ~  FLP 4/12 on Simva40 showed TChol 104, TG 62, HDL 29, LDL 63... C/o severe muscle cramps & wants to stop for 61mo> cramping resolved! ~  Referred to Lipid Clinic & they restarted his Crestor at 5mg /d> tol well so far... ~  FLP 10/12 on Cres5 showed TChol 114, TG 40, HDL 36, LDL 70 ~  FLP 2/13 on Cres5 showed TChol 124, TG 21, HDL 40, LDL  80 ~  FLP 4/14 on Cres5 showed TChol 119, TG 44, HDL 38, LDL 72   HYPOTHYROIDISM - on SYNTHROID 154mcg/d now; prev 173mcg/d dose was decreased 4/12... ~  labs 2/09 showed TSH= 0.69...  8/09 showed TSH= 0.44... ~  labs 2/10 showed TSH= 0.68 ~  labs 3/11 showed TSH= 0.52 ~  Labs 4/12 on Synthroid125 showed TSH= 0.33... We decided to decr the dose to 112/d. ~  Labs 4/13 on Levo112 showed TSH= 4.60 ~  Labs 4/14 on Levo112 showed TSH= 5.46  GERD, IRRITABLE BOWEL SYNDROME, COLONIC POLYPS - followed by [facility]... on Protonix40 (he tried OTC Zantac 75mg  on his own)... also takes  LIBRAX Prn...  ~  colonoscopy 1/04 w/ divertics, diminutive rectal polyp (adenomatous), hems...  ~  f/u colon 1/07 showed no recurrent polyps... ~  colonoscopy 3/12 at [facility]- neg x for hems & [facility] rec f/u 64yrs...  HYPERTROPHY PROSTATE W/UR OBST & OTH LUTS (ICD-600.01) - followed by [id number] on RAPAFLO 8mg /d (stopped Flomax due to side effect- ED)... doing well without LTOS, just complains of nocturia x 1-2 ~  labs 2/09 showed PSA= 0.76 ~  labs 2/10 showed PSA= 0.90 ~  2011 PSA checked by DrDavis (pt states it was OK)... ~  Labs 4/12 showed PSA= 1.22 ~  Labs 4/13 showed PSA= 1.31 ~  10/13: presents c/o incr freq, nocturia x2-4, & weak stream despite Rapaflo; he is intol to Flomax; refer to Urology for further eval=> seen by DrEskridge, no changes made...  LEFT ARM & SHOULDER PAIN >> he was checked by Alinda Sierras but not improving he says; we discussed second opinion at [facility], DrSypher==> notes reviewed.  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 ~  9/10:  requesting Rx for Robaxin for muscle spasm as needed.  PLANTAR FASCIITIS (ICD-728.71) - c/o pain in his feet secondary to plantar fasciitis eval & Rx from [facility] w/ Celebrex & shots... ~  9/10: we discussed change to [facility] - symptoms improved.  Hx of DIZZINESS (ICD-780.4) -  sudden episode dizziness Sep09 working at [employer] around [employer] w/ room spinning- lasted and resolved spontaneously, felt light headed afterwards... no LOC, syncope, seizure activity, etc... no CP/ palpit/ SOB/ etc... there were several EMTs in the restaurant and they checked him out- stable and transported to ER... exam was neg-  they did labs: all norm, didn't do scans (can't get MRI due to metallic clamp in sinus), he can't take ASA due to bleeding from his OWR.[name]. treated w/ MECLIZINE w/ improvement.  ANXIETY (ICD-300.00) & INSOMNIA, CHRONIC (ICD-307.42) - he uses CHLORAZEPATE 7.5mg  Prn & AMBIEN10mg Penni Homans for chronic persistant insomnia...  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  Health Maintenance: ~  GI:  followed by  DrMedoff & up to date on colon etc... (last 1/07 & 48yr f/u due 1/12). ~  GU:  followed by DrRDavis in past; PSAs have all been wnl... ~  Immunizations:  he getsyearly Flu vaccinations in the fall;  OK Pneumovax 03/12/10 at age[age];     Past Surgical History  Procedure Laterality Date  . Varicocelectomy  1991    Dr. Gaynelle Arabian  . 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 Other surgical history      pulse laser for facial telangiectasias inthe past  . Excision left wrist ganglian/ myxoid cyst  05-30-2009  . Cardiovascular stress test  01-14-2003    NO ISCHEMIA / EF 61%/ NORMAL LE WALL MOTION  . Transthoracic echocardiogram  10-17-2008   DR CRENSHAW    NORMAL LVF/ EF 60%/  MILDLY DILATED RIGHT ATRIUM/ VENTRICULE  . 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 Sharyn Lull    Outpatient Encounter Prescriptions as of 03/28/2013  Medication Sig Dispense Refill  . acetaminophen (TYLENOL) 500 MG tablet Take 500 mg by mouth as needed. Rotates with ibuprofen/naproxen. Does not take all three at one time.       .  clidinium-chlordiazePOXIDE (LIBRAX) 2.5-5 MG per capsule TAKE ONE CAPSULE BY MOUTH 3 TIMES A DAY AS NEEDED FOR ABDOMINAL CRAMPING  90 capsule  1  . clorazepate (TRANXENE) 7.5 MG tablet Take 1 tablet (7.5 mg total) by mouth 3 (three) times daily as needed.  90 tablet  5  . CRESTOR 5 MG tablet TAKE 1 TABLET BY MOUTH EVERY DAY  30 tablet  11  . dextromethorphan-guaiFENesin (MUCINEX DM) 30-600 MG per 12 hr tablet Take 1-2 tablets by mouth every 12 (twelve) hours as needed.      . Diphenhyd-Hydrocort-Nystatin (FIRST-DUKES MOUTHWASH) SUSP garlge and swallow 1 tsp four times daily as needed  120 mL  5  . loratadine (CLARITIN) 10 MG tablet Take 1 tablet (10 mg total) by mouth daily. As needed for allergies   30 tablet  2  . meclizine (ANTIVERT) 25 MG tablet Take 1/2 tablet to 1 whole tablet by mouth 4 times daily as directed for dizziness  90 tablet  11  . meloxicam (MOBIC) 7.5 MG tablet TAKE 1 TABLET BY MOUTH ONCE DAILY AS NEEDED  30 tablet  6  . Multiple Vitamins-Minerals (CENTRUM) tablet Take 1 tablet by mouth daily.        . naproxen sodium (ANAPROX) 220 MG tablet Take 220 mg by mouth as needed. Rotates with ibuprofen/acetaminophen. Does not take at same time as others.       . pantoprazole (PROTONIX) 40 MG tablet Take 40 mg by mouth every other day.      [name] RAPAFLO 8 MG CAPS capsule TAKE ONE CAPSULE BY MOUTH EVERY DAY  30 capsule  6  . zolpidem (AMBIEN) 10 MG tablet TAKE 1 TABLET BY MOUTH AT BEDTIME AS NEEDED  30 tablet  5  . fluticasone-salmeterol (ADVAIR HFA) 115-21 MCG/ACT inhaler Inhale 2 puffs into the lungs 2 (two) times daily. Rinse mouth after use  1 Inhaler  12  . HYDROcodone-homatropine (HYCODAN) 5-1.5 MG/5ML syrup Take 5 mLs by mouth every 6 (six) hours as needed.      . Levothyroxine Sodium 125 MCG CAPS Take 1 capsule by mouth daily.       No facility-administered encounter medications on file as of 03/28/2013.    Allergies  Allergen Reactions  . Zocor (Simvastatin - High Dose)     Leg  cramps     Current Medications, Allergies, Past Medical History, Past Surgical History, Family History, and Social History were reviewed in [employer] record.    Review of Systems       See HPI - other systems neg except as noted... The patient denies anorexia, fever, weight loss, weight gain, vision loss, decreased hearing, hoarseness, chest pain, syncope, dyspnea on exertion, peripheral edema, prolonged cough, headaches, hemoptysis, abdominal pain, melena, hematochezia, severe indigestion/heartburn, hematuria, incontinence, muscle weakness, suspicious skin lesions, transient blindness, difficulty walking, depression, unusual weight change, abnormal bleeding, enlarged lymph nodes, and angioedema.     Objective:   Physical Exam      WD, WN, 72 y/o WM in NAD... GENERAL:  Alert & oriented; pleasant & cooperative... HEENT:  [place]/AT, EOM-wnl, PERRLA, EACs-clear, TMs-wnl, NOSE- occluded left nares from skin grafts, MOUTH- mucosal telangiectasias.  NECK:  Supple w/ fairROM; no JVD; normal carotid impulses w/o bruits; no thyromegaly or nodules palpated; no lymphadenopathy. CHEST:  Clear to P & A; without wheezes/ rales/ or rhonchi. HEART:  Regular Rhythm; without murmurs/ rubs/ or gallops. ABDOMEN:  Soft & nontender; normal bowel sounds; no organomegaly or masses detected. EXT: without deformities, mild arthritic changes; no varicose veins/ venous insuffic/ or edema. NEURO:  CN's intact; motor testing normal; sensory testing normal; gait normal & balance OK. DERM:  mult telangiectasias...  RADIOLOGY DATA:  Reviewed in the EPIC EMR & discussed w/ the patient...  LABORATORY DATA:  Reviewed in the EPIC EMR & discussed w/ the patient...   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 CXR w/o change in the RLL AVM.     Hx CP>  Prev cardiac w/u by DrCrenshaw was neg, no recurrent CP complaints, he remains active, etc...     Chol>  On Crestor5 now; he stopped Simva40 due to muscle cramps & FLP looks good on the Cres5...     Hypothy>  On Synthroid 149mcg/d now>  He is clinically & biochem euthyroid...     GI>  Followed by [facility] & had colonoscopy 3/12- neg x for hems & DrM rec f/u 91yrs...     GU>  Hx BPH w/ BOO & prev followed by ZOXWRUEA on Rapaflo 8mg /d; now checked by DrEskridge & stable...     Ortho>  See above- stable on Mobic & OTC analgesics as needed...     Anxiety/ Insomnia>  He has Chlorazepate for Prn use, and Ambien to use for sleep Qhs...   Patient's Medications  New Prescriptions   No medications on file  Previous Medications   ACETAMINOPHEN (TYLENOL) 500 MG TABLET    Take 500 mg by mouth as needed. Rotates with ibuprofen/naproxen. Does not take all three at one time.    CLIDINIUM-CHLORDIAZEPOXIDE (LIBRAX) 2.5-5 MG PER CAPSULE    TAKE ONE CAPSULE BY MOUTH 3 TIMES A DAY AS NEEDED FOR ABDOMINAL CRAMPING   CRESTOR 5 MG TABLET    TAKE 1 TABLET BY MOUTH EVERY DAY   DEXTROMETHORPHAN-GUAIFENESIN (MUCINEX  DM) 30-600 MG PER 12 HR TABLET    Take 1-2 tablets by mouth every 12 (twelve) hours as needed.   DIPHENHYD-HYDROCORT-NYSTATIN (FIRST-DUKES MOUTHWASH) SUSP    garlge and swallow 1 tsp four times daily as needed   FLUTICASONE-SALMETEROL (ADVAIR HFA) 115-21 MCG/ACT INHALER    Inhale 2 puffs into the lungs 2 (two) times daily. Rinse mouth after use   HYDROCODONE-HOMATROPINE (HYCODAN) 5-1.5 MG/5ML SYRUP    Take 5 mLs by mouth every 6 (six) hours as needed.   LORATADINE (CLARITIN) 10 MG TABLET    Take 1 tablet (10 mg total) by mouth daily. As needed for  allergies    MECLIZINE (ANTIVERT) 25 MG TABLET    Take 1/2 tablet to 1 whole tablet by mouth 4 times daily as directed for dizziness   MELOXICAM (MOBIC) 7.5 MG TABLET    TAKE 1 TABLET BY MOUTH ONCE DAILY AS NEEDED   MULTIPLE VITAMINS-MINERALS (CENTRUM) TABLET    Take 1 tablet by mouth daily.     NAPROXEN SODIUM (ANAPROX) 220 MG TABLET    Take 220 mg by mouth as needed. Rotates with ibuprofen/acetaminophen. Does not take at same time as others.    ZOLPIDEM (AMBIEN) 10 MG TABLET    TAKE 1 TABLET BY MOUTH AT BEDTIME AS NEEDED  Modified Medications   Modified Medication Previous Medication   CLORAZEPATE (TRANXENE) 7.5 MG TABLET clorazepate (TRANXENE) 7.5 MG tablet      Take 1 tablet (7.5 mg total) by mouth 3 (three) times daily as needed.    Take 1 tablet (7.5 mg total) by mouth 3 (three) times daily as needed.   LEVOTHYROXINE (SYNTHROID, LEVOTHROID) 112 MCG TABLET levothyroxine (SYNTHROID, LEVOTHROID) 112 MCG tablet      Take 1 tablet (112 mcg total) by mouth daily before breakfast.    Take 112 mcg by mouth daily before breakfast.   PANTOPRAZOLE (PROTONIX) 40 MG TABLET pantoprazole (PROTONIX) 40 MG tablet      Take 1 tablet (40 mg total) by mouth every other day.    Take 40 mg by mouth every other day.   SILODOSIN (RAPAFLO) 8 MG CAPS CAPSULE RAPAFLO 8 MG CAPS capsule      Take 1 capsule (8 mg total) by mouth daily with breakfast.    TAKE ONE CAPSULE BY MOUTH EVERY DAY  Discontinued Medications   LEVOTHYROXINE SODIUM 125 MCG CAPS    Take 1 capsule by mouth daily.

## 2013-04-02 ENCOUNTER — Telehealth: Payer: Self-pay | Admitting: Pulmonary Disease

## 2013-04-02 NOTE — Progress Notes (Signed)
Quick Note:  Spoke with patient, made him aware of results as listed below per DN. Patient verbalized understanding Of this and nothing further needed at this time. ______

## 2013-04-02 NOTE — Telephone Encounter (Signed)
Spoke with patient, made him aware of results as listed below per DN. Patient verbalized understanding  Of this and nothing further needed at this time.   Result Note    Please notify patient>    FLP looks good on Crestor5- continue same...   Chems, CBC, Thyroid, PSA, VitD> ALL WNL- keep up the good work!

## 2013-06-05 ENCOUNTER — Telehealth: Payer: Self-pay | Admitting: Pulmonary Disease

## 2013-06-05 NOTE — Telephone Encounter (Signed)
Pt can come in on 6/23 at 2:30 for appt with SN. Please have pt bring all meds with him to this appt.   thanks

## 2013-06-05 NOTE — Telephone Encounter (Signed)
C/o increased legs cramps x 2-3 weeks.  Requesting appt. Pt states that he has been sent to [facility] for this same symptoms previously and would like to know if he needs to be referred back over there or come in and be seen by SN.  No available appts today.   Please advise Dr Kriste Basque. Thanks.

## 2013-06-05 NOTE — Telephone Encounter (Signed)
Spoke with the pt and have added to SN schedule for 06/11/13 at 2:30 pm  Nothing further needed per pt

## 2013-06-11 ENCOUNTER — Ambulatory Visit (INDEPENDENT_AMBULATORY_CARE_PROVIDER_SITE_OTHER): Payer: Medicare Other | Admitting: Pulmonary Disease

## 2013-06-11 ENCOUNTER — Other Ambulatory Visit: Payer: Self-pay | Admitting: Pulmonary Disease

## 2013-06-11 ENCOUNTER — Encounter: Payer: Self-pay | Admitting: Pulmonary Disease

## 2013-06-11 VITALS — BP 108/70 | HR 56 | Temp 97.5°F | Ht 70.0 in | Wt 187.8 lb

## 2013-06-11 DIAGNOSIS — G4762 Sleep related leg cramps: Secondary | ICD-10-CM | POA: Insufficient documentation

## 2013-06-11 DIAGNOSIS — E7800 Pure hypercholesterolemia, unspecified: Secondary | ICD-10-CM

## 2013-06-11 DIAGNOSIS — I780 Hereditary hemorrhagic telangiectasia: Secondary | ICD-10-CM

## 2013-06-11 HISTORY — DX: Sleep related leg cramps: G47.62

## 2013-06-11 NOTE — Progress Notes (Signed)
Subjective:    Patient ID: Gregory [name], male    DOB: [birth year], 72 y.o.   MRN: [id number]  HPI 72 y/o WM here for a follow up visit... he has multiple medical problems including:  Hx OWR syndrome;  Hx atypCP;  Hyperchol;  Hypothyroidism;  GERD/ Divertics/ IBS/ Colon polyps;  BPH/ BOO;  DJD/ LBP/ Plantar fasciitis;  Hx dizziness;  Anxiety & chronic persistant insomnia...  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    Hx CP>  Prev cardiac w/u by DrCrenshaw was neg, no recurrent CP complaints, he remains active, etc...    Chol>  On Crestor5 now; he stopped Simva40 due to muscle cramps which improved/ resolved off the med; went to Lipid clinic & they tried Cres5- tol well so far!  FLP today shows TChol 114, TG 40, HDL 36, LDL 70, and LFTs are normal...    Hypothy>  On Synthroid 13mcg/d now>  Labs 4/12 revealed TSH= 0.33 on Synthroid125 so we decr the dose slightly to 112.[name]    GI>  Followed by [facility] & had colonoscopy 3/12 at [facility]- neg x for hems & DrM rec f/u 73yrs...    GU>  Hx BPH w/ BOO & followed by [id number] on Rapaflo 8mg /d;  PSA done 4/12 = 1.22...    Ortho>  eval 12/11 by DrNorris for bilat leg pain & cramping, ?min atrophy of left gastroc- he rec tonic water & neurology eval> he saw DrWillis 3/12 (note reviewed)> NCV was normal; EMG c/w mild S1 radiculopathy; he rec MRI- pending (we did not get f/u note);  Now c/o some right arm/ shoulder  symptoms & I have rec eval by DrSypher to sort this out...    Anxiety/ Insomnia>  He has Chlorazepate for Prn use, and Ambien to use for sleep Qhs...  ~  March 27, 2012:  [name] & [name] is stable- no new complaints or concerns... He continues to f/u w/ the Lipid Clinic & is taking Crestor 5mg /d (see labs below)... 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    Hx CP>  Prev cardiac w/u by DrCrenshaw was neg, no recurrent CP complaints, he remains active, etc...    Chol>  On Crestor5 now; he stopped Simva40 due to muscle cramps which improved/ resolved off the med; went to Lipid clinic & they tried Cres5- tol well so far!  FLP last= 2/13 & showed TChol 124, TG 21, HDL 40, LDL 80    Hypothy>  On Synthroid  166mcg/d now>  Labs 4/12 revealed TSH= 0.33 on Synthroid125 so we decr the dose slightly to 112.[name]    GI>  Followed by [facility] & had colon[facility] 3/12 at [facility]- neg x for hems & DrM rec f/u 79yrs...    GU>  Hx BPH w/ BOO & followed by ZOXWRUEA on Rapaflo 8mg /d;  PSA done 4/12 = 1.22...    Ortho>  eval 12/11 by DrNorris for bilat leg pain & cramping, ?min atrophy of left gastroc- he rec tonic water & neurology eval> he saw DrWillis 3/12 (note reviewed)> NCV was normal; EMG c/w mild S1 radiculopathy; he rec MRI- pending (we did not get f/u note);  Now c/o some right arm/ shoulder symptoms & I have rec eval by DrSypher to sort this out...    Anxiety/ Insomnia>  He has Chlorazepate for Prn use, and Ambien to use for sleep Qhs... We reviewed prob list, meds, xrays and labs> see below for updates >> LABS 4/13:  Chems- wnl;   CBC- wnl;  TSH=4.60;  PSA=1.31;  UA- clear  ~  September 27, 2012:  40mo [name] & [name]'s CC= chronic cough, mostly dry, some congestion, little phlegm/ no blood/ etc; he saw TP 8/13 & given Hydromet/ Delsym/ Mobic/ Lozenges & these helped but prob is persistent;  Chest is clear, labs 4/13 normal, CXR 8/13 stable;  We reviewed options and decided to try Advair-HFA 115/21- 2puffsBid plus cough drops etc...     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    We reviewed prob list, meds, xrays and labs> see below for updates >> OK Flu shot today...  ~  March 28, 2013:  40mo [name] & [name] has had several interval complaints> skin cancer removed from his forehead, cortisone shot in his knee, c/o dry throat & intermitt hoarse (Rx w/ lozenges, throat drops), he's been traveling w/ his job & doing ok w/o CP SOB etc...     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    Hx Cough> resolved w/ [id number] & just using prn now; + Hycodan, Claritin, Mucinex, MMW- c/o dry throat & encouraged to use lozenges; he had URI treated by TP 1/14 w/esolved...    Hx CP>  Prev cardiac w/u by DrCrenshaw was neg, no recurrent CP complaints, he remains active, etc...    Chol>  On Crestor5 now; he stopped Simva40 due to muscle cramps which resolved off this med; FLP 4/14 showed TChol 119, TG 44, HDL 38, LDL 72    Hypothy>  On Synthroid 137mcg/d now>  Labs 4/12 revealed TSH= 0.33 on  Synthroid125 so we decr the dose slightly to 112; f/u TSH= 4.60 & 5.46    GI- GERD, IBS, Polyps> on Protonix40Qod & Librax prn; followed by [facility] & had colonoscopy 3/12- neg x for hems & DrM rec f/u 65yrs...    GU>  Hx BPH w/ BOO & prev followed  by AVWUJWJX on Rapaflo 8mg /d;  He saw DrEskridge 12/13 who notes a slow PSAV c/wBPH & DRE was wnl...    Ortho>  On Mobic7.5 + OTC meds; eval 12/11 by DrNorris for bilat leg pain & cramping, ?min atrophy of left gastroc- he rec tonic water & neurology eval> he saw DrWillis 3/12 (note reviewed)> NCV was normal; EMG c/w mild S1 radiculopathy; he rec MRI- pt decided against the MRI (we did not get f/u note);  Now c/o some right arm/ shoulder symptoms & I have rec eval by DrSypher to sort this out...    Anxiety/ Insomnia>  He has Chlorazepate7.5 for Prn use, and Ambien10 to use for sleep Qhs... We reviewed prob list, meds, xrays and labs> see below for updates >>  LABS 4/14:  FLP- at goals on Cres5;  Chems- wnl;  CBC- wnl;  TSH=5.46 on Synthroid112;  VitD=50;  PSA=2.56     ~  June 11, 2013:  2-26mo [name] & add-on appt for leg cramps> these occur mostly at night & wake him up; he stopped his Cres5 on his own & he swears that the cramps are better off this med; he recalls that yrs ago he had the same problem from Simva & changing to Cres5 resolved the issue until recently;  We discussed the options and decided to leave off the statins for now, try to control Chol w/ diet & exercise, f/u FLP soon...  We reviewed the [employer] suggestions for Tonic Water, yellow mustard, bar of soap in the bed...     We reviewed prob list, meds, xrays and labs> see below for updates >>            Problem List:   OTHER DISEASES OF NASAL CAVITY AND SINUSES - Hx of Osler-Weber-Rendue syndrome/ hereditary telangiectasias... he's had numerous nose bleeds and surgery by Mikael Spray, now followed by [facility]... left nares is occluded w/ skin graft- no lumen... this is quite  uncomfortable to the patient but he's been told there is nothing further that can be done for this... ~  3/11:  notes occas nosebleeds but he is able to handle them... ~  4/12:  No diff in his pattern of mild nose bleeds & he denies any severe epistaxis episodes... 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 ~  4/14:  He notes infreq bleeding now w/ saline etc...  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 known GI telangiectasia as well without signif GI bleeding in the past- followed by [facility]... he's also has skin telangiectasis w/ prev laser therapy at [facility]... extensive nasal problems as above... ~  labs 2/09 showed Hg= 17.0 ~  labs 2/10 showed Hg= 16.4 ~  CXR 3/11 is chr incr markings esp RLL- no change, NAD...  ~  Labs 6/11 showed Hg= 15.9 ~  Labs 4/12 showed Hg= 16.0 ~  CXR 10/12 showed mild biapical scarring, RLL AVM, NAD... ~  Labs 4/13 showed Hg= 15.2 ~  CXR 8/13 showed heart at upper lim of norm, increased markings & apical scarring, chr incr markings in RLL- AVM, NAD...  Hx of CHEST PAIN (ICD-786.50) - seen Sep09 w/ several recent bouts of CP while working in yard, washing car, etc... resolved w/ rest & Protonix... EKG showed WNL, prev [facility] 1/04 at [facility] was neg (no ischemia or infarction  and EF= 61%)... Cardiac eval DrCrenshaw w/ 2DEcho 10/09 showing norm LV- no regional wall motion abn, norm LVF w/ EF= 60%, mild dil RA/ RV;  and norm MYOVIEW (no ischemia or infarction, EF= 58%)...  HYPERCHOLESTEROLEMIA, MILD (ICD-272.0) - now on CRESTOR 5mg /d>  Prev Rx w/ Simva40 but developed muscle cramping which resolved off the med. ~  FLP 6/08 showed TChol 208, TG 75, HDL 34, LDL 139... he preferred diet Rx- ~  FLP 2/09 showed TChol 172, TG 69, HDL 30, LDL 129... he agreed to Crestor10... ~  FLP 8/09 on  Crestor10 showed TChol 91, TG 49, HDL 28, LDL 54... rec- keep same. ~  FLP 2/10 on Crestor10 showed TChol 108, TG 56, HDL 31, LDL 66 ~  9/10:  we discussed changing Crestor10 to Simvastatin40 for $$$ reasons... ~  FLP 3/11 on Simva40 showed TChol 105, TG 59, HDL 39, LDL 54... continue same. ~  FLP 4/12 on Simva40 showed TChol 104, TG 62, HDL 29, LDL 63... C/o severe muscle cramps & wants to stop for 22mo> cramping resolved! ~  Referred to Lipid Clinic & they restarted his Crestor at 5mg /d> tol well so far... ~  FLP 10/12 on Cres5 showed TChol 114, TG 40, HDL 36, LDL 70 ~  FLP 2/13 on Cres5 showed TChol 124, TG 21, HDL 40, LDL 80 ~  FLP 4/14 on Cres5 showed TChol 119, TG 44, HDL 38, LDL 72  ~  6/14: pt stopped Cres5 on his own due to leg cramps & decided to hold statins for now w/ repeat FLP off meds in several months...  HYPOTHYROIDISM - on SYNTHROID 143mcg/d now; prev 169mcg/d dose was decreased 4/12... ~  labs 2/09 showed TSH= 0.69...  8/09 showed TSH= 0.44... ~  labs 2/10 showed TSH= 0.68 ~  labs 3/11 showed TSH= 0.52 ~  Labs 4/12 on Synthroid125 showed TSH= 0.33... We decided to decr the dose to 112/d. ~  Labs 4/13 on Levo112 showed TSH= 4.60 ~  Labs 4/14 on Levo112 showed TSH= 5.46  GERD, IRRITABLE BOWEL SYNDROME, COLONIC POLYPS - followed by [facility]... on Protonix40 (he tried OTC Zantac 75mg  on his own)... also takes  LIBRAX Prn...  ~  colonoscopy 1/04 w/ divertics, diminutive rectal polyp (adenomatous), hems...  ~  f/u colon 1/07 showed no recurrent polyps... ~  colonoscopy 3/12 at [facility]- neg x for hems & [facility] rec f/u 34yrs...  HYPERTROPHY PROSTATE W/UR OBST & OTH LUTS (ICD-600.01) - followed by ZOXWRUEA on RAPAFLO 8mg /d (stopped Flomax due to side effect- ED)... doing well without LTOS, just complains of nocturia x 1-2 ~  labs 2/09 showed PSA= 0.76 ~  labs 2/10 showed PSA= 0.90 ~  2011 PSA checked by DrDavis (pt states it was OK)... ~  Labs 4/12 showed PSA=  1.22 ~  Labs 4/13 showed PSA= 1.31 ~  10/13: presents c/o incr freq, nocturia x2-4, & weak stream despite Rapaflo; he is intol to Flomax; refer to Urology for further eval=> seen by DrEskridge, no changes made...  LEFT ARM & SHOULDER PAIN >> he was checked by Alinda Sierras but not improving he says; we discussed second opinion at the [facility], DrSypher==> notes reviewed.  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 ~  9/10:  requesting Rx for Robaxin for muscle spasm as needed.  PLANTAR FASCIITIS (ICD-728.71) - c/o pain in his feet secondary to plantar fasciitis eval & Rx from [facility] w/ Celebrex & shots... ~  9/10: we discussed change to [facility] - symptoms improved.  Hx NOCTURNAL LEG CRAMPS >>  ~  This initially occurred several yrs ago while on Simvastatin & the cramps resolved off this med; he was subseq placed on Cres5 & tol well... ~  6/14: he indicates that noct leg cramps returned x1wk & he stopped the Cres5 w/ improvement; he wants to leave off the statins for now; rec to try tonic water/ yellow mustard prn...  Hx of DIZZINESS (ICD-780.4) - sudden episode dizziness Sep09 working at [employer] around [employer] w/ room spinning- lasted and resolved spontaneously, felt light headed afterwards... no LOC, syncope, seizure activity, etc... no CP/ palpit/ SOB/ etc... there were several EMTs in the restaurant and they checked him out- stable and transported to ER... exam was neg-  they did labs: all norm, didn't do scans (can't get MRI due to metallic clamp in sinus), he can't take ASA due to bleeding from his OWR.[name]. treated w/ MECLIZINE w/ improvement.  ANXIETY (ICD-300.00) & INSOMNIA, CHRONIC (ICD-307.42) - he uses CHLORAZEPATE 7.5mg  Prn & AMBIEN10mg Penni Homans for chronic persistant insomnia...  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 ~  10/13: he reports SCCa removed from forehead recently & referred for  Moh's...  Health Maintenance: ~  GI:  followed by [facility] & up to date on colon etc... (last 1/07 & 54yr f/u due 1/12). ~  GU:  followed by [facility] in past; PSAs have all been wnl... ~  Immunizations:  he getsyearly Flu vaccinations in the fall;  OK Pneumovax 03/12/10 at age[age];     Past Surgical History  Procedure Laterality Date  . Varicocelectomy  1991    Dr. Gaynelle Arabian  . 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 Other surgical history      pulse laser for facial telangiectasias inthe past  . Excision left wrist ganglian/ myxoid cyst  05-30-2009  . Cardiovascular stress test  01-14-2003    NO ISCHEMIA / EF 61%/ NORMAL LE WALL MOTION  . Transthoracic echocardiogram  10-17-2008   DR CRENSHAW    NORMAL LVF/ EF 60%/  MILDLY DILATED RIGHT ATRIUM/ VENTRICULE  . 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 Sharyn Lull    Outpatient Encounter Prescriptions as of 06/11/2013  Medication Sig Dispense Refill  . acetaminophen (TYLENOL) 500 MG tablet Take 500 mg by mouth as needed. Rotates with ibuprofen/naproxen. Does not take all three at one time.       . clidinium-chlordiazePOXIDE (LIBRAX) 2.5-5 MG per capsule TAKE ONE CAPSULE BY MOUTH 3 TIMES A DAY AS NEEDED FOR ABDOMINAL CRAMPING  90 capsule  1  . clorazepate (TRANXENE) 7.5 MG tablet Take 1 tablet (7.5 mg total) by mouth 3 (three) times daily as needed.  90 tablet  5  . dextromethorphan-guaiFENesin (MUCINEX DM) 30-600 MG per 12 hr tablet Take 1-2 tablets by mouth every 12 (twelve) hours as needed.      . Diphenhyd-Hydrocort-Nystatin (FIRST-DUKES MOUTHWASH) SUSP garlge and swallow 1 tsp four times daily as needed  120 mL  5  . HYDROcodone-homatropine (HYCODAN) 5-1.5 MG/5ML syrup Take 5 mLs by mouth every 6 (six) hours as needed.      [name] levothyroxine (SYNTHROID, LEVOTHROID) 112 MCG tablet Take 1 tablet (112 mcg total) by mouth daily before breakfast.  30 tablet  11  . loratadine  (CLARITIN) 10 MG tablet Take 1 tablet (10 mg  total) by mouth daily. As needed for allergies   30 tablet  2  . meclizine (ANTIVERT) 25 MG tablet Take 1/2 tablet to 1 whole tablet by mouth 4 times daily as directed for dizziness  90 tablet  11  . meloxicam (MOBIC) 7.5 MG tablet TAKE 1 TABLET BY MOUTH EVERY DAY AS NEEDED  30 tablet  6  . Multiple Vitamins-Minerals (CENTRUM) tablet Take 1 tablet by mouth daily.        . naproxen sodium (ANAPROX) 220 MG tablet Take 220 mg by mouth as needed. Rotates with ibuprofen/acetaminophen. Does not take at same time as others.       . pantoprazole (PROTONIX) 40 MG tablet Take 1 tablet (40 mg total) by mouth every other day.  30 tablet  11  . silodosin (RAPAFLO) 8 MG CAPS capsule Take 1 capsule (8 mg total) by mouth daily with breakfast.  30 capsule  11  . zolpidem (AMBIEN) 10 MG tablet TAKE 1 TABLET BY MOUTH AT BEDTIME AS NEEDED  30 tablet  5  . CRESTOR 5 MG tablet TAKE 1 TABLET BY MOUTH EVERY DAY  30 tablet  11  . [DISCONTINUED] fluticasone-salmeterol (ADVAIR HFA) 115-21 MCG/ACT inhaler Inhale 2 puffs into the lungs 2 (two) times daily. Rinse mouth after use  1 Inhaler  12   No facility-administered encounter medications on file as of 06/11/2013.    Allergies  Allergen Reactions  . Zocor (Simvastatin - High Dose)     Leg cramps     Current Medications, Allergies, Past Medical History, Past Surgical History, Family History, and Social History were reviewed in [employer] record.    Review of Systems       See HPI - other systems neg except as noted... The patient denies anorexia, fever, weight loss, weight gain, vision loss, decreased hearing, hoarseness, chest pain, syncope, dyspnea on exertion, peripheral edema, prolonged cough, headaches, hemoptysis, abdominal pain, melena, hematochezia, severe indigestion/heartburn, hematuria, incontinence, muscle weakness, suspicious skin lesions, transient blindness, difficulty walking,  depression, unusual weight change, abnormal bleeding, enlarged lymph nodes, and angioedema.     Objective:   Physical Exam      WD, WN, 72 y/o WM in NAD... GENERAL:  Alert & oriented; pleasant & cooperative... HEENT:  [place]/AT, EOM-wnl, PERRLA, EACs-clear, TMs-wnl, NOSE- occluded left nares from skin grafts, MOUTH- mucosal telangiectasias.  NECK:  Supple w/ fairROM; no JVD; normal carotid impulses w/o bruits; no thyromegaly or nodules palpated; no lymphadenopathy. CHEST:  Clear to P & A; without wheezes/ rales/ or rhonchi. HEART:  Regular Rhythm; without murmurs/ rubs/ or gallops. ABDOMEN:  Soft & nontender; normal bowel sounds; no organomegaly or masses detected. EXT: without deformities, mild arthritic changes; no varicose veins/ venous insuffic/ or edema. NEURO:  CN's intact; motor testing normal; sensory testing normal; gait normal & balance OK. DERM:  mult telangiectasias...  RADIOLOGY DATA:  Reviewed in the EPIC EMR & discussed w/ the patient...  LABORATORY DATA:  Reviewed in the EPIC EMR & discussed w/ the patient...   Assessment & Plan:    LEG CRAMPS >> he is quite sure they are from his statin Rx & we decided to leave these off for now, try to control on diet/ exercise, f/u FLP in several months...   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 CXR w/o change in the RLL AVM.     Hx CP>  Prev cardiac w/u by DrCrenshaw was neg, no recurrent CP complaints, he remains active, etc...     Chol>  Off Cres5 now due to recurrent cramps, rec diet + exercise w/ f/u labs...     Hypothy>  On Synthroid  136mcg/d now>  He is clinically & biochem euthyroid...     GI>  Followed by [facility] & had colonoscopy 3/12- neg x for hems & DrM rec f/u 31yrs...     GU>  Hx BPH w/ BOO & prev followed by ZOXWRUEA on Rapaflo 8mg /d; now checked by DrEskridge & stable...     Ortho>  See above- stable on Mobic & OTC analgesics as needed...     Anxiety/ Insomnia>  He has Chlorazepate for Prn use, and Ambien to use for sleep Qhs...   Patient's Medications  New Prescriptions   No medications on file  Previous Medications   ACETAMINOPHEN (TYLENOL) 500 MG TABLET    Take 500 mg by mouth as needed. Rotates with ibuprofen/naproxen. Does not take all three at one time.    CLIDINIUM-CHLORDIAZEPOXIDE (LIBRAX) 2.5-5 MG PER CAPSULE    TAKE ONE CAPSULE BY MOUTH 3 TIMES A DAY AS NEEDED FOR ABDOMINAL CRAMPING   CLORAZEPATE (TRANXENE) 7.5 MG TABLET    Take 1 tablet (7.5 mg total) by mouth 3 (three) times daily as needed.   DEXTROMETHORPHAN-GUAIFENESIN (MUCINEX DM) 30-600 MG PER 12 HR TABLET    Take 1-2 tablets by mouth every 12 (twelve) hours as needed.   DIPHENHYD-HYDROCORT-NYSTATIN (FIRST-DUKES MOUTHWASH) SUSP    garlge and swallow 1 tsp four times daily as needed   HYDROCODONE-HOMATROPINE (HYCODAN) 5-1.5 MG/5ML SYRUP    Take 5 mLs by mouth every 6 (six) hours as needed.   LEVOTHYROXINE (SYNTHROID, LEVOTHROID) 112 MCG TABLET    Take 1 tablet (112 mcg total) by mouth daily before breakfast.   LORATADINE (CLARITIN) 10 MG TABLET    Take 1 tablet (10 mg total) by mouth daily. As needed for allergies    MECLIZINE (ANTIVERT) 25 MG TABLET    Take 1/2 tablet to 1 whole tablet by mouth 4 times daily as directed for dizziness   MELOXICAM (MOBIC) 7.5 MG TABLET    TAKE 1 TABLET BY MOUTH EVERY DAY AS NEEDED   MULTIPLE VITAMINS-MINERALS (CENTRUM) TABLET    Take 1 tablet by mouth daily.     NAPROXEN SODIUM (ANAPROX) 220 MG TABLET    Take 220 mg by mouth as needed. Rotates with ibuprofen/acetaminophen. Does not take at same time as others.     PANTOPRAZOLE (PROTONIX) 40 MG TABLET    Take 1 tablet (40 mg total) by mouth every other day.   SILODOSIN (RAPAFLO) 8 MG CAPS CAPSULE    Take 1 capsule (8 mg total) by mouth daily with breakfast.   ZOLPIDEM (AMBIEN) 10 MG TABLET    TAKE 1 TABLET BY MOUTH AT BEDTIME AS NEEDED  Modified Medications   No medications on file  Discontinued Medications   CRESTOR 5 MG TABLET    TAKE 1 TABLET BY MOUTH EVERY DAY   FLUTICASONE-SALMETEROL (ADVAIR HFA) 115-21 MCG/ACT INHALER    Inhale 2 puffs into the lungs 2 (two) times daily. Rinse mouth after use

## 2013-06-11 NOTE — Patient Instructions (Addendum)
Today we updated your med list in our EPIC system...    We decided to leave the CRESTOR 5mg  off for the next 3-84mo to assess your leg cramps 7 we will recheck your Lipid profile at that time...  For the cramps>>    Try the Tonic water, Yellow mustard tricks...    Call if the cramps don't abate & resolve for an additional muscle relaxer...  Call for any questions...  Let's keep the prev scheduled f/u appt as we discussed.Marland KitchenMarland Kitchen

## 2013-07-30 ENCOUNTER — Other Ambulatory Visit: Payer: Self-pay | Admitting: Pulmonary Disease

## 2013-08-01 ENCOUNTER — Telehealth: Payer: Self-pay | Admitting: Pulmonary Disease

## 2013-08-01 NOTE — Telephone Encounter (Signed)
Directions sent for protoix was 1 QOD. Per pt is telling [facility] takes this QD daily. When this was last refilled in April it was sent as him taking this QOD. Please advise SN if okay to send protonix 40 mg QD thanks Last OV 06/11/13

## 2013-08-02 NOTE — Telephone Encounter (Signed)
Per Sn ok to send as protonix once daily. Rx called in to pharmacy. Carron Curie, CMA

## 2013-08-07 ENCOUNTER — Other Ambulatory Visit: Payer: Self-pay | Admitting: *Deleted

## 2013-08-07 MED ORDER — PANTOPRAZOLE SODIUM 40 MG PO TBEC: DELAYED_RELEASE_TABLET | ORAL | Status: DC

## 2013-09-20 ENCOUNTER — Encounter: Payer: Self-pay | Admitting: Cardiology

## 2013-09-20 ENCOUNTER — Encounter: Payer: Self-pay | Admitting: *Deleted

## 2013-09-21 ENCOUNTER — Encounter: Payer: Self-pay | Admitting: Cardiology

## 2013-09-21 ENCOUNTER — Ambulatory Visit (INDEPENDENT_AMBULATORY_CARE_PROVIDER_SITE_OTHER): Payer: Medicare Other | Admitting: Cardiology

## 2013-09-21 VITALS — BP 122/68 | HR 59 | Ht 70.0 in | Wt 185.4 lb

## 2013-09-21 DIAGNOSIS — E7800 Pure hypercholesterolemia, unspecified: Secondary | ICD-10-CM

## 2013-09-21 DIAGNOSIS — R0609 Other forms of dyspnea: Secondary | ICD-10-CM

## 2013-09-21 DIAGNOSIS — R0600 Dyspnea, unspecified: Secondary | ICD-10-CM

## 2013-09-21 DIAGNOSIS — R079 Chest pain, unspecified: Secondary | ICD-10-CM

## 2013-09-21 NOTE — Patient Instructions (Addendum)
Your physician recommends that you schedule a follow-up appointment in: AS NEEDED PENDING TEST RESULTS  Your physician has requested that you have an echocardiogram. Echocardiography is a painless test that uses sound waves to create images of your heart. It provides your doctor with information about the size and shape of your heart and how well your heart's chambers and valves are working. This procedure takes approximately one hour. There are no restrictions for this procedure.   Your physician has requested that you have an exercise tolerance test. For further information please visit [email or web address]. Please also follow instruction sheet, as given.    Exercise Stress Electrocardiography An exercise stress test is a heart test (EKG) which is done while you are moving. You will walk on a treadmill. This test will tell your doctor how your heart does when it is forced to work harder and how much activity you can safely handle. BEFORE THE TEST  Wear shorts or athletic pants.  Wear comfortable tennis shoes.  Women need to wear a bra that allows patches to be put on under it. TEST  An EKG cable will be attached to your waist. This cable is hooked up to patches, which look like round stickers stuck to your chest.  You will be asked to walk on the treadmill.  You will walk until you are too tired or until you are told to stop.  Tell the doctor right away if you have:  Chest pain.  Leg cramps.  Shortness of breath.  Dizziness.  The test may last 30 minutes to 1 hour. The timing depends on your physical condition and the condition of your heart. AFTER THE TEST  You will rest for about 6 minutes. During this time, your heart rhythm and blood pressure will be checked.  The testing equipment will be removed from your body and you can get dressed.  You may go home or back to your hospital room. You may keep doing all your usual activities as told by your doctor. Finding out the  results of your test Ask when your test results will be ready. Make sure you get your test results. Document Released: 05/24/2008 Document Revised: 02/28/2012 Document Reviewed: 05/24/2008 [employer] Patient Information 2014 [employer].   

## 2013-09-21 NOTE — Progress Notes (Signed)
HPI: 72 year-old male for evaluation of dyspnea. Nuclear study in October of 2009 showed an ejection fraction of 58% and normal perfusion. Echocardiogram in October of 2009 showed normal LV function, mild right atrial and right ventricular enlargement. Patient states that recently he has noticed increased dyspnea with more moderate activities such as climbing stairs.  He does not have this with routine activities. There is no orthopnea, PND, pedal edema, palpitations or exertional chest pain. He occasionally has lightheaded spells for 15 seconds. There is no frank syncope. There is no associated chest pain, palpitations, nausea, dyspnea or neurological symptoms.  Current Outpatient Prescriptions  Medication Sig Dispense Refill  . acetaminophen (TYLENOL) 500 MG tablet Take 500 mg by mouth as needed. Rotates with ibuprofen/naproxen. Does not take all three at one time.       . clidinium-chlordiazePOXIDE (LIBRAX) 2.5-5 MG per capsule TAKE ONE CAPSULE BY MOUTH 3 TIMES A DAY AS NEEDED FOR ABDOMINAL CRAMPING  90 capsule  1  . clorazepate (TRANXENE) 7.5 MG tablet Take 1 tablet (7.5 mg total) by mouth 3 (three) times daily as needed.  90 tablet  5  . dextromethorphan-guaiFENesin (MUCINEX DM) 30-600 MG per 12 hr tablet Take 1-2 tablets by mouth every 12 (twelve) hours as needed.      . Diphenhyd-Hydrocort-Nystatin (FIRST-DUKES MOUTHWASH) SUSP garlge and swallow 1 tsp four times daily as needed  120 mL  5  . HYDROcodone-homatropine (HYCODAN) 5-1.5 MG/5ML syrup Take 5 mLs by mouth every 6 (six) hours as needed.      [name] levothyroxine (SYNTHROID, LEVOTHROID) 112 MCG tablet Take 1 tablet (112 mcg total) by mouth daily before breakfast.  30 tablet  11  . loratadine (CLARITIN) 10 MG tablet Take 1 tablet (10 mg total) by mouth daily. As needed for allergies   30 tablet  2  . meclizine (ANTIVERT) 25 MG tablet Take 1/2 tablet to 1 whole tablet by mouth 4 times daily as directed for dizziness  90 tablet  11  .  meloxicam (MOBIC) 7.5 MG tablet TAKE 1 TABLET BY MOUTH EVERY DAY AS NEEDED  30 tablet  6  . Multiple Vitamins-Minerals (CENTRUM) tablet Take 1 tablet by mouth daily.        . naproxen sodium (ANAPROX) 220 MG tablet Take 220 mg by mouth as needed. Rotates with ibuprofen/acetaminophen. Does not take at same time as others.       . pantoprazole (PROTONIX) 40 MG tablet TAKE 1 TABLET BY MOUTH EVERY day  30 tablet  1  . silodosin (RAPAFLO) 8 MG CAPS capsule Take 1 capsule (8 mg total) by mouth daily with breakfast.  30 capsule  11  . zolpidem (AMBIEN) 10 MG tablet TAKE 1 TABLET BY MOUTH AT BEDTIME AS NEEDED  30 tablet  5   No current facility-administered medications for this visit.    Allergies  Allergen Reactions  . Zocor [Simvastatin - High Dose]     Leg cramps    Past Medical History  Diagnosis Date  . Other diseases of nasal cavity and sinuses(478.19)   . Pure hypercholesterolemia   . Unspecified hypothyroidism   . Irritable bowel syndrome   . Benign neoplasm of colon   . Hypertrophy of prostate with urinary obstruction and other lower urinary tract symptoms (LUTS)   . Lumbago   . Plantar fascial fibromatosis   . Anxiety state, unspecified   . Persistent disorder of initiating or maintaining sleep   . GERD (gastroesophageal reflux disease)   .  Arthritis   . 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 History of nonmelanoma skin cancer EXCISION SQUAMOUS CELL FROM HAND  . 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 Varicocelectomy  1991    Dr. Gaynelle Arabian  . 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 Other surgical history      pulse laser for facial telangiectasias inthe past  . Excision left wrist ganglian/ myxoid cyst  05-30-2009    . Cardiovascular stress test  01-14-2003    NO ISCHEMIA / EF 61%/ NORMAL LE WALL MOTION  . Transthoracic echocardiogram  10-17-2008   DR Linley Moskal    NORMAL LVF/ EF 60%/  MILDLY DILATED RIGHT ATRIUM/ VENTRICULE  . 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 Sharyn Lull    History   Social History  . Marital Status: Married    Spouse Name: Dewayne Hatch    Number of Children: 2  . Years of Education: N/A   Occupational History  .      Consultant   Social History Main Topics  . Smoking status: Never Smoker   . Smokeless tobacco: Never Used  . Alcohol Use: No  . Drug Use: No  . Sexual Activity: Not on file   Other Topics Concern  . Not on file   Social History Narrative  . No narrative on file    Family History  Problem Relation Age of Onset  . Diabetes Mother   . Kidney failure Father   . Hypertension Brother     ROS: no fevers or chills, productive cough, hemoptysis, dysphasia, odynophagia, melena, hematochezia, dysuria, hematuria, rash, seizure activity, orthopnea, PND, pedal edema, claudication. Remaining systems are negative.  Physical Exam:   Blood pressure 122/68, pulse 59, height 5\' 10"  (1.778 m), weight 185 lb 6.4 oz (84.097 kg).  General:  Well developed/well nourished in NAD Skin warm/dry Patient not depressed No peripheral clubbing Back-normal HEENT-normal/normal eyelids Neck supple/normal carotid upstroke bilaterally; no bruits; no JVD; no thyromegaly chest - CTA/ normal expansion CV - RRR/normal S1 and S2; no murmurs, rubs or gallops;  PMI nondisplaced Abdomen -NT/ND, no HSM, no mass, + bowel sounds, no bruit 2+ femoral pulses, no bruits Ext-no edema, chords, 2+ DP Neuro-grossly nonfocal  ECG sinus rhythm at a rate of 57. No ST changes.

## 2013-09-21 NOTE — Assessment & Plan Note (Signed)
Patient complains of dyspnea more prominent compared to previous. I will schedule an exercise treadmill for risk stratification. Schedule echocardiogram to assess LV function.

## 2013-09-21 NOTE — Assessment & Plan Note (Signed)
Management per primary care. 

## 2013-10-04 ENCOUNTER — Encounter: Payer: Self-pay | Admitting: Pulmonary Disease

## 2013-10-04 ENCOUNTER — Ambulatory Visit (INDEPENDENT_AMBULATORY_CARE_PROVIDER_SITE_OTHER): Payer: Medicare Other | Admitting: Pulmonary Disease

## 2013-10-04 ENCOUNTER — Other Ambulatory Visit (INDEPENDENT_AMBULATORY_CARE_PROVIDER_SITE_OTHER): Payer: Medicare Other

## 2013-10-04 ENCOUNTER — Ambulatory Visit (INDEPENDENT_AMBULATORY_CARE_PROVIDER_SITE_OTHER)
Admission: RE | Admit: 2013-10-04 | Discharge: 2013-10-04 | Disposition: A | Discharge status: Home / Self Care | Payer: Medicare Other | Source: Ambulatory Visit | Attending: Pulmonary Disease | Admitting: Pulmonary Disease

## 2013-10-04 VITALS — BP 122/62 | HR 58 | Temp 97.6°F | Ht 70.0 in | Wt 188.4 lb

## 2013-10-04 DIAGNOSIS — G4762 Sleep related leg cramps: Secondary | ICD-10-CM

## 2013-10-04 DIAGNOSIS — R079 Chest pain, unspecified: Secondary | ICD-10-CM

## 2013-10-04 DIAGNOSIS — J3489 Other specified disorders of nose and nasal sinuses: Secondary | ICD-10-CM

## 2013-10-04 DIAGNOSIS — R0609 Other forms of dyspnea: Secondary | ICD-10-CM

## 2013-10-04 DIAGNOSIS — M545 Low back pain: Secondary | ICD-10-CM

## 2013-10-04 DIAGNOSIS — E039 Hypothyroidism, unspecified: Secondary | ICD-10-CM

## 2013-10-04 DIAGNOSIS — E7800 Pure hypercholesterolemia, unspecified: Secondary | ICD-10-CM

## 2013-10-04 DIAGNOSIS — R0600 Dyspnea, unspecified: Secondary | ICD-10-CM

## 2013-10-04 DIAGNOSIS — Z23 Encounter for immunization: Secondary | ICD-10-CM

## 2013-10-04 DIAGNOSIS — K219 Gastro-esophageal reflux disease without esophagitis: Secondary | ICD-10-CM

## 2013-10-04 DIAGNOSIS — N401 Enlarged prostate with lower urinary tract symptoms: Secondary | ICD-10-CM

## 2013-10-04 DIAGNOSIS — I780 Hereditary hemorrhagic telangiectasia: Secondary | ICD-10-CM

## 2013-10-04 DIAGNOSIS — K589 Irritable bowel syndrome without diarrhea: Secondary | ICD-10-CM

## 2013-10-04 DIAGNOSIS — F411 Generalized anxiety disorder: Secondary | ICD-10-CM

## 2013-10-04 DIAGNOSIS — D126 Benign neoplasm of colon, unspecified: Secondary | ICD-10-CM

## 2013-10-04 LAB — LIPID PANEL
HDL: 38.1 mg/dL — ABNORMAL LOW (ref >39.00)
LDL Cholesterol: 111 mg/dL — ABNORMAL HIGH (ref 0–99)
Total CHOL/HDL Ratio: 4
Triglycerides: 29 mg/dL (ref 0.0–149.0)
VLDL: 5.8 mg/dL (ref 0.0–40.0)

## 2013-10-04 MED ORDER — CILIDINIUM-CHLORDIAZEPOXIDE 2.5-5 MG PO CAPS: ORAL_CAPSULE | ORAL | Status: DC

## 2013-10-04 MED ORDER — PANTOPRAZOLE SODIUM 40 MG PO TBEC: DELAYED_RELEASE_TABLET | ORAL | Status: DC

## 2013-10-04 MED ORDER — MELOXICAM 7.5 MG PO TABS: ORAL_TABLET | ORAL | Status: DC

## 2013-10-04 MED ORDER — ZOLPIDEM TARTRATE 10 MG PO TABS: ORAL_TABLET | ORAL | Status: DC

## 2013-10-04 MED ORDER — CLORAZEPATE DIPOTASSIUM 7.5 MG PO TABS: 7.5000 mg | ORAL_TABLET | Freq: Three times a day (TID) | ORAL | Status: DC | PRN

## 2013-10-04 NOTE — Patient Instructions (Signed)
Today we updated your med list in our EPIC system...    Continue your current medications the same...  Today we dd your follow up CXR & LIPD PROFILE (on diet alone)...    We will contact you w/ the results when available...   We also gave you the 2014 FLU vaccine & the combibnation tetanus shot called the TDAP (good for 10 yrs)...  Keep up the good work on the treadmill, and try increasing the elevation toward the end of the session...  Call for any questions...  Let's plan a follow up visit in 71mo, sooner if needed for problems.Marland KitchenMarland Kitchen

## 2013-10-04 NOTE — Progress Notes (Signed)
Subjective:    Patient ID: Gregory [name], male    DOB: 27-Jun-1941, 72 y.o.   MRN: [id number]  HPI 72 y/o WM here for a follow up visit... he has multiple medical problems including:  Hx OWR syndrome;  Hx atypCP;  Hyperchol;  Hypothyroidism;  GERD/ Divertics/ IBS/ Colon polyps;  BPH/ BOO;  DJD/ LBP/ Plantar fasciitis;  Hx dizziness;  Anxiety & chronic persistant insomnia...  ~  March 27, 2012:  75mo [name] & [name] is stable- no new complaints or concerns... He continues to f/u w/ the [facility] & is taking Crestor 5mg /d (see labs below)... 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    Hx CP>  Prev cardiac w/u by DrCrenshaw was neg, no recurrent CP complaints, he remains active, etc...    Chol>  On Crestor5 now; he stopped Simva40 due to muscle cramps which improved/ resolved off the med; went to Lipid clinic & they tried Cres5- tol well so far!  FLP last= 2/13 & showed TChol 124, TG 21, HDL 40, LDL 80    Hypothy>  On Synthroid 136mcg/d now>  Labs 4/12 revealed TSH= 0.33 on Synthroid125 so we decr the dose slightly to 112.[name]    GI>  Followed by [facility] & had colonoscopy 3/12 at [facility]- neg x for hems & DrM rec f/u 28yrs...    GU>  Hx BPH w/ BOO & followed by [id number] on Rapaflo 8mg /d;  PSA done 4/12 = 1.22...    Ortho>  eval 12/11 by DrNorris for bilat leg pain & cramping, ?min atrophy of left gastroc- he rec tonic water & neurology eval> he saw DrWillis 3/12 (note reviewed)> NCV was normal; EMG c/w mild S1 radiculopathy; he rec MRI- pending (we did not get f/u note);  Now c/o some right arm/ shoulder symptoms & I  have rec eval by DrSypher to sort this out...    Anxiety/ Insomnia>  He has Chlorazepate for Prn use, and Ambien to use for sleep Qhs... We reviewed prob list, meds, xrays and labs> see below for updates >> LABS 4/13:  Chems- wnl;  CBC- wnl;  TSH=4.60;  PSA=1.31;  UA- clear  ~  September 27, 2012:  75mo [name] & Gregory [name] CC= chronic cough, mostly dry, some congestion, little phlegm/ no blood/ etc; he saw TP 8/13 & given Hydromet/ Delsym/ Mobic/ Lozenges & these helped but prob is persistent;  Chest is clear, labs 4/13 normal, CXR 8/13 stable;  We reviewed options and decided to try Advair-HFA 115/21- 2puffsBid plus cough drops etc...     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    We reviewed  prob list, meds, xrays and labs> see below for updates >> OK Flu shot today...  ~  March 28, 2013:  30mo [name] & Gregory [name] has had several interval complaints> skin cancer removed from his forehead, cortisone shot in his knee, c/o dry throat & intermitt hoarse (Rx w/ lozenges, throat drops), he's been traveling w/ his job & doing ok w/o CP SOB etc...     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    Hx Cough> resolved w/ [id number] & just using prn now; + Hycodan, Claritin, Mucinex, MMW- c/o dry throat & encouraged to use lozenges; he had URI treated by TP 1/14 w/esolved...    Hx CP>  Prev cardiac w/u by DrCrenshaw was neg, no recurrent CP complaints, he remains active,  etc...    Chol>  On Crestor5 now; he stopped Simva40 due to muscle cramps which resolved off this med; McCammon 4/14 showed TChol 119, TG 44, HDL 38, LDL 72    Hypothy>  On Synthroid 168mcg/d now>  Labs 4/12 revealed TSH= 0.33 on Synthroid125 so we decr the dose slightly to 112; f/u TSH= 4.60 & 5.46    GI- GERD, IBS, Polyps> on Protonix40Qod & Librax prn; followed by [facility] & had colonoscopy 3/12- neg x for hems & DrM rec f/u 64yrs...    GU>  Hx BPH w/ BOO & prev followed by [id number] on Rapaflo 8mg /d;  He saw DrEskridge 12/13 who notes a slow PSAV c/wBPH & DRE was wnl...    Ortho>  On Mobic7.5 + OTC meds; eval 12/11 by DrNorris for bilat leg pain & cramping, ?min atrophy of left gastroc- he rec tonic water & neurology eval> he saw DrWillis 3/12 (note reviewed)> NCV was normal; EMG c/w mild S1 radiculopathy; he rec MRI- pt decided against the MRI (we did not get f/u note);  Now c/o some right arm/ shoulder symptoms & I have rec eval by DrSypher to sort this out...    Anxiety/ Insomnia>  He has Chlorazepate7.5 for Prn use, and Ambien10 to use for sleep Qhs... We reviewed prob list, meds, xrays and labs> see below for updates >>  LABS 4/14:  FLP- at goals on Cres5;  Chems- wnl;  CBC- wnl;  TSH=5.46 on Synthroid112;  VitD=50;  PSA=2.56     ~  June 11, 2013:  2-39mo [name] & add-on appt for leg cramps> these occur mostly at night & wake him up; he stopped his Cres5 on his own & he swears that the cramps are better off this med; he recalls that yrs ago he had the same problem from [place] changing to Cres5 resolved the issue until recently;  We discussed the options and decided to leave off the statins for now, try to control Chol w/ diet & exercise, f/u FLP soon...  We reviewed the [employer] suggestions for Tonic Water, yellow mustard, bar of soap in the bed...     We reviewed prob list, meds, xrays and labs> see below for updates >>   ~  October 04, 2013:  67mo [name] & Gregory [name] recently saw DrCrenshaw c/o SOB (eg-  climbing stairs) & dizzy (no assoc symptoms)> known OWR, no change on exam, EKG was normal; they plan f/u 2DEcho & GXT... 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    Hx Cough> resolved w/ [id number] & off now; + Hycodan, Claritin, Mucinex, MMW- c/o dry throat & encouraged to use lozenges...    Hx CP>  Prev cardiac w/u by DrCrenshaw was neg, no recurrent CP complaints, he remains active but notes DOE w/ stairs, ok on level ground, DrCrenshaw plans f/u 2DEcho & GXT soon...    Chol> off Crestor now due to musc cramps; he stopped Simva40 due to muscle cramps which resolved off this med; FLP 10/14 on diet alone showed TChol 155, TG 29, HDL 38, LDL 111    Hypothy>  On Synthroid 162mcg/d now>  Labs 4/12 revealed TSH= 0.33 on Synthroid125 so we decr the dose slightly to 112;  f/u TSH= 4.60 & 5.46    GI- GERD, IBS, Polyps> on Protonix40Qd & Librax prn; followed by [facility] & had colonoscopy 3/12- neg x for hems & DrM rec f/u 62yrs...    GU>  Hx BPH w/ BOO & prev followed by [id number] on Rapaflo 8mg /d;  He saw DrEskridge 12/13 who notes a slow PSAV c/w BPH & DRE was wnl...    Ortho>  On Mobic7.5 + OTC meds; eval 12/11 by DrNorris for bilat leg pain & cramping, ?min atrophy of left gastroc- he rec tonic water & neurology eval> he saw DrWillis 3/12 (note reviewed)> NCV was normal; EMG c/w mild S1 radiculopathy; he rec MRI- pt decided against the MRI (we did not get f/u note)...    Anxiety/ Insomnia>  He has Chlorazepate7.5 for Prn use, and Ambien10 to use for sleep Qhs... We reviewed prob list, meds, xrays and labs> see below for updates >>  CXR 10/14 showed norm heart size, stable w/ known AVM in RLL, NAD... LABS 10/14:  FLP- on diet alone looks pretty good w/ LDL=111...           Problem List:    OTHER DISEASES OF NASAL CAVITY AND SINUSES - Hx of Osler-Weber-Rendue syndrome/ hereditary telangiectasias... he's had numerous nose bleeds and surgery by Mikael Spray, now followed by [facility]... left nares is occluded w/ skin graft- no lumen... this is quite uncomfortable to the patient but he's been told there is nothing further that can be done for this... ~  3/11:  notes occas nosebleeds but he is able to handle them... ~  4/12:  No diff in his pattern of mild nose bleeds & he denies any severe epistaxis episodes... 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 ~  4/14:  He notes infreq bleeding now w/ saline etc... 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 known GI telangiectasia as well without signif GI bleeding in the past- followed by [facility]... he's also has skin telangiectasis w/ prev laser therapy at [facility]... extensive nasal problems as above... ~  labs 2/09 showed Hg= 17.0 ~  labs 2/10 showed Hg= 16.4 ~  CXR 3/11 is chr incr markings esp RLL- no change, NAD...  ~  Labs 6/11 showed Hg= 15.9 ~  Labs 4/12 showed Hg= 16.0 ~  CXR 10/12 showed mild biapical scarring, RLL AVM, NAD... ~  Labs 4/13 showed Hg= 15.2 ~  CXR 8/13 showed heart at upper lim of norm, increased markings & apical scarring, chr incr markings in RLL- AVM, NAD.[name]. 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 resolved w/ rest & Protonix... EKG showed WNL, prev [facility]Test 1/04 at [facility] was neg (no ischemia or infarction and EF=  61%)... Cardiac eval DrCrenshaw w/ 2DEcho 10/09 showing norm LV- no regional wall motion abn, norm LVF w/ EF= 60%, mild dil RA/ RV;  and norm MYOVIEW (no ischemia or infarction, EF= 58%)...  HYPERCHOLESTEROLEMIA, MILD (ICD-272.0) - now on CRESTOR 5mg /d>  Prev Rx w/ Simva40 but developed muscle cramping which resolved off the med. ~  FLP 6/08 showed TChol 208, TG 75, HDL 34, LDL 139... he preferred diet Rx- ~  FLP 2/09 showed TChol 172, TG 69, HDL 30, LDL 129... he agreed to Crestor10... ~  FLP 8/09 on Crestor10 showed TChol 91, TG 49, HDL 28, LDL 54... rec- keep same. ~  FLP 2/10 on Crestor10 showed TChol 108, TG 56, HDL 31, LDL 66 ~  9/10:  we discussed changing Crestor10 to Simvastatin40 for $$$ reasons... ~  FLP 3/11 on Simva40 showed TChol 105, TG 59, HDL 39, LDL 54... continue same. ~  FLP 4/12 on Simva40 showed TChol 104, TG 62, HDL 29, LDL 63... C/o severe muscle cramps & wants to stop for 56mo> cramping resolved! ~  Referred to [facility] Clinic & they restarted his Crestor at 5mg /d> tol well so far... ~  FLP 10/12 on Cres5 showed TChol 114, TG 40, HDL 36, LDL 70 ~  FLP 2/13 on Cres5 showed TChol 124, TG 21, HDL 40, LDL 80 ~  FLP 4/14 on Cres5 showed TChol 119, TG 44, HDL 38, LDL 72  ~  6/14: pt stopped Cres5 on his own due to leg cramps & decided to hold statins for now w/ repeat FLP off meds in several months... ~  FLP 10/14 on diet alone showed TChol 155, TG 29, HDL 38, LDL 111   HYPOTHYROIDISM - on SYNTHROID 16mcg/d now; prev 123mcg/d dose was decreased 4/12... ~  labs 2/09 showed TSH= 0.69...  8/09 showed TSH= 0.44... ~  labs 2/10 showed TSH= 0.68 ~  labs 3/11 showed TSH= 0.52 ~  Labs 4/12 on Synthroid125 showed TSH= 0.33... We decided to decr the dose to 112/d. ~  Labs 4/13 on Levo112 showed TSH= 4.60 ~  Labs 4/14 on Levo112 showed TSH= 5.46  GERD, IRRITABLE BOWEL SYNDROME, COLONIC POLYPS - followed by [facility]... on Protonix40 (he tried OTC Zantac 75mg  on his own)... also takes   LIBRAX Prn...  ~  colonoscopy 1/04 w/ divertics, diminutive rectal polyp (adenomatous), hems...  ~  f/u colon 1/07 showed no recurrent polyps... ~  colonoscopy 3/12 at [facility]- neg x for hems & [facility] rec f/u 69yrs...  HYPERTROPHY PROSTATE W/UR OBST & OTH LUTS (ICD-600.01) - followed by [id number] on RAPAFLO 8mg /d (stopped Flomax due to side effect- ED)... doing well without LTOS, just complains of nocturia x 1-2 ~  labs 2/09 showed PSA= 0.76 ~  labs 2/10 showed PSA= 0.90 ~  2011 PSA checked by DrDavis (pt states it was OK)... ~  Labs 4/12 showed PSA= 1.22 ~  Labs 4/13 showed PSA= 1.31 ~  10/13: presents c/o incr freq, nocturia x2-4, & weak stream despite Rapaflo; he is intol to Flomax; refer to Urology for further eval=> seen by DrEskridge, no changes made...  LEFT ARM & SHOULDER PAIN >> he was checked by Alinda Sierras but not improving he says; we discussed second opinion at the [facility], DrSypher==> notes reviewed.  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 ~  9/10:  requesting Rx for Robaxin for muscle spasm as needed.  PLANTAR FASCIITIS (ICD-728.71) - c/o pain in his feet secondary to plantar fasciitis eval & Rx from [facility] w/ Celebrex & shots... ~  9/10: we discussed change to [facility] - symptoms improved.  Hx NOCTURNAL LEG CRAMPS >>  ~  This initially occurred several yrs ago while on Simvastatin & the cramps resolved off this med; he was subseq placed on Cres5 & tol well... ~  6/14: he indicates that noct leg cramps returned x1wk & he stopped the Cres5 w/ improvement; he wants to leave off the statins for now; rec to try tonic water/ yellow mustard prn...  Hx of DIZZINESS (ICD-780.4) - sudden episode dizziness Sep09 working at [employer] around [employer] w/ room spinning- lasted and resolved spontaneously, felt light headed afterwards... no LOC, syncope, seizure activity, etc... no CP/ palpit/ SOB/ etc... there  were several EMTs in the restaurant and they checked him out- stable and transported to ER... exam was neg-  they did labs: all norm, didn't do scans (can't get MRI due to metallic clamp in sinus), he can't take ASA due to bleeding from his OWR.[name]. treated w/ MECLIZINE w/ improvement.  ANXIETY (ICD-300.00) & INSOMNIA, CHRONIC (ICD-307.42) - he uses CHLORAZEPATE 7.5mg  Prn & AMBIEN10mg Penni Homans for chronic persistant insomnia...  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  Health Maintenance: ~  GI:  followed by [facility] & up to date on colon etc... (last 1/07 & 29yr f/u due 1/12). ~  GU:  followed by [facility] in past; PSAs have all been wnl... ~  Immunizations:  he getsyearly Flu vaccinations in the fall;  OK Pneumovax 03/12/10 at age[age];     Past Surgical History  Procedure Laterality Date  . Varicocelectomy  1991    Dr. Gaynelle Arabian  . 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 Other surgical history      pulse laser for facial telangiectasias inthe past  . Excision left wrist ganglian/ myxoid cyst  05-30-2009  . Cardiovascular stress test  01-14-2003    NO ISCHEMIA / EF 61%/ NORMAL LE WALL MOTION  . Transthoracic echocardiogram  10-17-2008   DR CRENSHAW    NORMAL LVF/ EF 60%/  MILDLY DILATED RIGHT ATRIUM/ VENTRICULE  . 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 Sharyn Lull    Outpatient Encounter Prescriptions as of 10/04/2013  Medication Sig Dispense Refill  . acetaminophen (TYLENOL) 500 MG tablet Take 500 mg by mouth as needed. Rotates with ibuprofen/naproxen. Does not take all three at one time.       . clidinium-chlordiazePOXIDE (LIBRAX) 2.5-5 MG per capsule TAKE ONE CAPSULE BY MOUTH 3 TIMES A DAY AS NEEDED FOR ABDOMINAL CRAMPING  90 capsule  1  . clorazepate (TRANXENE) 7.5 MG tablet Take 1 tablet (7.5 mg total) by mouth 3 (three)  times  daily as needed.  90 tablet  5  . dextromethorphan-guaiFENesin (MUCINEX DM) 30-600 MG per 12 hr tablet Take 1-2 tablets by mouth every 12 (twelve) hours as needed.      . Diphenhyd-Hydrocort-Nystatin (FIRST-DUKES MOUTHWASH) SUSP garlge and swallow 1 tsp four times daily as needed  120 mL  5  . HYDROcodone-homatropine (HYCODAN) 5-1.5 MG/5ML syrup Take 5 mLs by mouth every 6 (six) hours as needed.      [name] levothyroxine (SYNTHROID, LEVOTHROID) 112 MCG tablet Take 1 tablet (112 mcg total) by mouth daily before breakfast.  30 tablet  11  . loratadine (CLARITIN) 10 MG tablet Take 1 tablet (10 mg total) by mouth daily. As needed for allergies   30 tablet  2  . Magnesium 250 MG TABS Take 1 tablet by mouth at bedtime.      . meclizine (ANTIVERT) 25 MG tablet Take 1/2 tablet to 1 whole tablet by mouth 4 times daily as directed for dizziness  90 tablet  11  . meloxicam (MOBIC) 7.5 MG tablet TAKE 1 TABLET BY MOUTH EVERY DAY AS NEEDED  30 tablet  6  . Multiple Vitamins-Minerals (CENTRUM) tablet Take 1 tablet by mouth daily.        . naproxen sodium (ANAPROX) 220 MG tablet Take 220 mg by mouth as needed. Rotates with ibuprofen/acetaminophen. Does not take at same time as others.       . pantoprazole (PROTONIX) 40 MG tablet TAKE 1 TABLET BY MOUTH EVERY day  30 tablet  1  . silodosin (RAPAFLO) 8 MG CAPS capsule Take 1 capsule (8 mg total) by mouth daily with breakfast.  30 capsule  11  . zolpidem (AMBIEN) 10 MG tablet TAKE 1 TABLET BY MOUTH AT BEDTIME AS NEEDED  30 tablet  5   No facility-administered encounter medications on file as of 10/04/2013.    Allergies  Allergen Reactions  . Zocor [Simvastatin - High Dose]     Leg cramps     Current Medications, Allergies, Past Medical History, Past Surgical History, Family History, and Social History were reviewed in [employer] record.    Review of Systems       See HPI - other systems neg except as noted... The patient denies  anorexia, fever, weight loss, weight gain, vision loss, decreased hearing, hoarseness, chest pain, syncope, dyspnea on exertion, peripheral edema, prolonged cough, headaches, hemoptysis, abdominal pain, melena, hematochezia, severe indigestion/heartburn, hematuria, incontinence, muscle weakness, suspicious skin lesions, transient blindness, difficulty walking, depression, unusual weight change, abnormal bleeding, enlarged lymph nodes, and angioedema.     Objective:   Physical Exam      WD, WN, 72 y/o WM in NAD... GENERAL:  Alert & oriented; pleasant & cooperative... HEENT:  /AT, EOM-wnl, PERRLA, EACs-clear, TMs-wnl, NOSE- occluded left nares from skin grafts, MOUTH- mucosal telangiectasias.  NECK:  Supple w/ fairROM; no JVD; normal carotid impulses w/o bruits; no thyromegaly or nodules palpated; no lymphadenopathy. CHEST:  Clear to P & A; without wheezes/ rales/ or rhonchi. HEART:  Regular Rhythm; without murmurs/ rubs/ or gallops. ABDOMEN:  Soft & nontender; normal bowel sounds; no organomegaly or masses detected. EXT: without deformities, mild arthritic changes; no varicose veins/ venous insuffic/ or edema. NEURO:  CN's intact; motor testing normal; sensory testing normal; gait normal & balance OK. DERM:  mult telangiectasias...  RADIOLOGY DATA:  Reviewed in the EPIC EMR & discussed w/ the patient...  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 CXR w/o change in the RLL AVM.     Hx CP>  Prev cardiac w/u by  DrCrenshaw was neg, no recurrent CP complaints, but he notes DOE w/ stairs & DrCrenshaw plans 2DEcho & GXT soon...     Chol>  Off Cres5 now due to recurrent cramps, on diet alone & FLP is reasonable (LDL=111)...     Hypothy>  On Synthroid 11mcg/d now>  He is clinically & biochem euthyroid...     GI>  Followed by [facility] & had colonoscopy 3/12- neg x for hems & DrM rec f/u 88yrs...     GU>  Hx BPH w/ BOO & prev followed by [id number] on Rapaflo 8mg /d; now checked by DrEskridge & stable...     Ortho>  See above- stable on Mobic & OTC analgesics as needed...  LEG CRAMPS >> he is quite sure they are from his statin Rx & we decided to leave these off for now, try to control on diet/ exercise, f/u FLP in several months...     Anxiety/ Insomnia>  He has Chlorazepate for Prn use, and Ambien to use for sleep Qhs...   Patient's Medications  New Prescriptions   No medications on file  Previous Medications   ACETAMINOPHEN (TYLENOL) 500 MG TABLET    Take 500 mg by mouth as needed. Rotates with ibuprofen/naproxen. Does not take all three at one time.    DEXTROMETHORPHAN-GUAIFENESIN (MUCINEX DM) 30-600 MG PER 12 HR TABLET    Take 1-2 tablets by mouth every 12 (twelve) hours as needed.   DIPHENHYD-HYDROCORT-NYSTATIN (FIRST-DUKES MOUTHWASH) SUSP    garlge and swallow 1 tsp four times daily as needed   HYDROCODONE-HOMATROPINE (HYCODAN) 5-1.5 MG/5ML SYRUP    Take 5 mLs by mouth every 6 (six) hours as needed.   LEVOTHYROXINE (SYNTHROID, LEVOTHROID) 112 MCG TABLET    Take 1 tablet (112 mcg total) by mouth daily before breakfast.   LORATADINE (CLARITIN) 10 MG TABLET    Take 1 tablet (10 mg total) by mouth daily. As needed for allergies    MAGNESIUM 250 MG TABS    Take 1 tablet by mouth at bedtime.   MECLIZINE (ANTIVERT) 25 MG TABLET    Take 1/2 tablet to 1 whole tablet by mouth 4 times daily as directed for dizziness   MULTIPLE VITAMINS-MINERALS (CENTRUM) TABLET    Take 1 tablet by mouth daily.     NAPROXEN  SODIUM (ANAPROX) 220 MG TABLET    Take 220 mg by mouth as needed. Rotates with ibuprofen/acetaminophen. Does not take at same time as others.    SILODOSIN (RAPAFLO) 8 MG CAPS CAPSULE    Take 1 capsule (8 mg total) by mouth daily with breakfast.  Modified Medications   Modified Medication Previous Medication   CLIDINIUM-CHLORDIAZEPOXIDE (LIBRAX) 2.5-5 MG PER CAPSULE clidinium-chlordiazePOXIDE (LIBRAX) 2.5-5 MG per capsule      TAKE ONE CAPSULE BY MOUTH 3 TIMES A DAY AS NEEDED FOR ABDOMINAL CRAMPING    TAKE ONE CAPSULE BY MOUTH 3 TIMES A DAY AS NEEDED FOR ABDOMINAL CRAMPING  CLORAZEPATE (TRANXENE) 7.5 MG TABLET clorazepate (TRANXENE) 7.5 MG tablet      Take 1 tablet (7.5 mg total) by mouth 3 (three) times daily as needed.    Take 1 tablet (7.5 mg total) by mouth 3 (three) times daily as needed.   MELOXICAM (MOBIC) 7.5 MG TABLET meloxicam (MOBIC) 7.5 MG tablet      TAKE 1 TABLET BY MOUTH EVERY DAY AS NEEDED    TAKE 1 TABLET BY MOUTH EVERY DAY AS NEEDED   PANTOPRAZOLE (PROTONIX) 40 MG TABLET pantoprazole (PROTONIX) 40 MG tablet      TAKE 1 TABLET BY MOUTH EVERY day    TAKE 1 TABLET BY MOUTH EVERY day   ZOLPIDEM (AMBIEN) 10 MG TABLET zolpidem (AMBIEN) 10 MG tablet      TAKE 1 TABLET BY MOUTH AT BEDTIME AS NEEDED    TAKE 1 TABLET BY MOUTH AT BEDTIME AS NEEDED  Discontinued Medications   No medications on file

## 2013-10-11 ENCOUNTER — Ambulatory Visit (HOSPITAL_COMMUNITY): Payer: Medicare Other | Attending: Internal Medicine

## 2013-10-11 ENCOUNTER — Other Ambulatory Visit (HOSPITAL_COMMUNITY): Payer: Medicare Other

## 2013-10-11 DIAGNOSIS — R079 Chest pain, unspecified: Secondary | ICD-10-CM | POA: Insufficient documentation

## 2013-10-11 DIAGNOSIS — R0602 Shortness of breath: Secondary | ICD-10-CM

## 2013-10-11 DIAGNOSIS — I519 Heart disease, unspecified: Secondary | ICD-10-CM | POA: Insufficient documentation

## 2013-10-11 DIAGNOSIS — R0609 Other forms of dyspnea: Secondary | ICD-10-CM | POA: Insufficient documentation

## 2013-10-11 DIAGNOSIS — R0989 Other specified symptoms and signs involving the circulatory and respiratory systems: Secondary | ICD-10-CM | POA: Insufficient documentation

## 2013-10-11 DIAGNOSIS — E785 Hyperlipidemia, unspecified: Secondary | ICD-10-CM | POA: Insufficient documentation

## 2013-10-11 NOTE — Progress Notes (Signed)
Echocardiogram performed.  

## 2013-10-12 ENCOUNTER — Telehealth: Payer: Self-pay | Admitting: Cardiology

## 2013-10-12 NOTE — Telephone Encounter (Signed)
New Problem:  Pt states he is returning Debra's call regarding test results. Please advise

## 2013-10-12 NOTE — Telephone Encounter (Signed)
Spoke with pt, aware of echo results. 

## 2013-10-23 ENCOUNTER — Telehealth: Payer: Self-pay | Admitting: Pulmonary Disease

## 2013-10-23 ENCOUNTER — Ambulatory Visit (INDEPENDENT_AMBULATORY_CARE_PROVIDER_SITE_OTHER): Payer: Medicare Other | Admitting: Nurse Practitioner

## 2013-10-23 VITALS — BP 133/66 | HR 77

## 2013-10-23 DIAGNOSIS — R079 Chest pain, unspecified: Secondary | ICD-10-CM

## 2013-10-23 DIAGNOSIS — R0609 Other forms of dyspnea: Secondary | ICD-10-CM

## 2013-10-23 NOTE — Patient Instructions (Signed)
See Dr. Kriste Basque for your breathing issues - desats with exercise  We will see you back as needed  Call the [facility] office at [phone] if you have any questions, problems or concerns.

## 2013-10-23 NOTE — Progress Notes (Signed)
Exercise Treadmill Test  Pre-Exercise Testing Evaluation Rhythm: sinus bradycardia  Rate: 58     Test  Exercise Tolerance Test Ordering MD: Olga Millers, MD  Interpreting MD: Norma Fredrickson, NP  Unique Test No: 1  Treadmill:  1  Indication for ETT: chest pain - rule out ischemia  Contraindication to ETT: No   Stress Modality: exercise - treadmill  Cardiac Imaging Performed: non   Protocol: standard Bruce - maximal  Max BP:  199/77  Max MPHR (bpm):  149 85% MPR (bpm):  127  MPHR obtained (bpm):  141 % MPHR obtained:  95%  Reached 85% MPHR (min:sec):  6:25 Total Exercise Time (min-sec):  8 minutes  Workload in METS:  10.1 Borg Scale: 17  Reason ETT Terminated:  dyspnea    ST Segment Analysis At Rest: normal ST segments - no evidence of significant ST depression With Exercise: no evidence of significant ST depression  Other Information Arrhythmia:  No Angina during ETT:  absent (0) Quality of ETT:  diagnostic  ETT Interpretation:  normal - no evidence of ischemia by ST analysis  Comments: Patient presents today for routine GXT. Has had dyspnea and atypical chest pain. No known CAD. Echo with normal LV function and mild MR and grade 1 diastolic dysfunction noted.  Today the patient exercised on the standard Bruce protocol for a total of 8 minutes.  Good exercise tolerance.  Fair blood pressure response.  Clinically negative for chest pain. Test was stopped due to dyspnea - oxygen saturations down to 87% with exercise.  EKG negative for ischemia. Rare PVC noted. No significant arrhythmia noted.   Recommendations: Refer back to PCP for hypoxia/desats with exercise  We will see back prn  Patient is agreeable to this plan and will call if any problems develop in the interim.   Rosalio Macadamia, RN, ANP-C [facility] [address] [place], [place]  [address]

## 2013-10-23 NOTE — Telephone Encounter (Signed)
error 

## 2013-11-21 ENCOUNTER — Ambulatory Visit (INDEPENDENT_AMBULATORY_CARE_PROVIDER_SITE_OTHER): Payer: Medicare Other | Admitting: Pulmonary Disease

## 2013-11-21 ENCOUNTER — Encounter: Payer: Self-pay | Admitting: Pulmonary Disease

## 2013-11-21 VITALS — BP 120/62 | HR 74 | Temp 97.7°F | Ht 70.0 in | Wt 191.8 lb

## 2013-11-21 DIAGNOSIS — R0600 Dyspnea, unspecified: Secondary | ICD-10-CM

## 2013-11-21 DIAGNOSIS — E7800 Pure hypercholesterolemia, unspecified: Secondary | ICD-10-CM

## 2013-11-21 DIAGNOSIS — M545 Low back pain, unspecified: Secondary | ICD-10-CM

## 2013-11-21 DIAGNOSIS — D126 Benign neoplasm of colon, unspecified: Secondary | ICD-10-CM

## 2013-11-21 DIAGNOSIS — R0609 Other forms of dyspnea: Secondary | ICD-10-CM

## 2013-11-21 DIAGNOSIS — K589 Irritable bowel syndrome without diarrhea: Secondary | ICD-10-CM

## 2013-11-21 DIAGNOSIS — I780 Hereditary hemorrhagic telangiectasia: Secondary | ICD-10-CM

## 2013-11-21 DIAGNOSIS — E039 Hypothyroidism, unspecified: Secondary | ICD-10-CM

## 2013-11-21 DIAGNOSIS — K219 Gastro-esophageal reflux disease without esophagitis: Secondary | ICD-10-CM

## 2013-11-21 DIAGNOSIS — N401 Enlarged prostate with lower urinary tract symptoms: Secondary | ICD-10-CM

## 2013-11-21 DIAGNOSIS — J3489 Other specified disorders of nose and nasal sinuses: Secondary | ICD-10-CM

## 2013-11-21 DIAGNOSIS — N138 Other obstructive and reflux uropathy: Secondary | ICD-10-CM

## 2013-11-21 NOTE — Progress Notes (Signed)
Subjective:    Patient ID: Gregory [name], male    DOB: [birth year], 72 y.o.   MRN: [id number]  HPI 72 y/o WM here for a follow up visit... he has multiple medical problems including:  Hx OWR syndrome;  Hx atypCP;  Hyperchol;  Hypothyroidism;  GERD/ Divertics/ IBS/ Colon polyps;  BPH/ BOO;  DJD/ LBP/ Plantar fasciitis;  Hx dizziness;  Anxiety & chronic persistant insomnia...  ~  March 27, 2012:  72 y.o. [name] & Gregory [name] is stable- no new complaints or concerns... He continues to f/u w/ the [facility] & is taking Crestor 5mg /d (see labs below)... 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    Hx CP>  Prev cardiac w/u by DrCrenshaw was neg, no recurrent CP complaints, he remains active, etc...    Chol>  On Crestor5 now; he stopped Simva40 due to muscle cramps which improved/ resolved off the med; went to Lipid clinic & they tried Cres5- tol well so far!  FLP last= 2/13 & showed TChol 124, TG 21, HDL 40, LDL 80    Hypothy>  On Synthroid 18mcg/d now>  Labs 4/12 revealed TSH= 0.33 on Synthroid125 so we decr the dose slightly to 112.[name]    GI>  Followed by [facility] & had colonoscopy 3/12 at [facility]- neg x for hems & DrM rec f/u 1yrs...    GU>  Hx BPH w/ BOO & followed by [id number] on Rapaflo 8mg /d;  PSA done 4/12 = 1.22...    Ortho>  eval 12/11 by DrNorris for bilat leg pain & cramping, ?min atrophy of left gastroc- he rec tonic water & neurology eval> he saw DrWillis 3/12 (note reviewed)> NCV was normal; EMG c/w mild S1 radiculopathy; he rec MRI- pending (we did not get f/u note);  Now c/o some right arm/ shoulder symptoms & I  have rec eval by DrSypher to sort this out...    Anxiety/ Insomnia>  He has Chlorazepate for Prn use, and Ambien to use for sleep Qhs... We reviewed prob list, meds, xrays and labs> see below for updates >> LABS 4/13:  Chems- wnl;  CBC- wnl;  TSH=4.60;  PSA=1.31;  UA- clear  ~  September 27, 2012:  72 y.o. [name] & [name]'s CC= chronic cough, mostly dry, some congestion, little phlegm/ no blood/ etc; he saw TP 8/13 & given Hydromet/ Delsym/ Mobic/ Lozenges & these helped but prob is persistent;  Chest is clear, labs 4/13 normal, CXR 8/13 stable;  We reviewed options and decided to try Advair-HFA 115/21- 2puffsBid plus cough drops etc...     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    We reviewed  prob list, meds, xrays and labs> see below for updates >> OK Flu shot today...  ~  March 28, 2013:  72 y.o. [name] & Gregory [name] has had several interval complaints> skin cancer removed from his forehead, cortisone shot in his knee, c/o dry throat & intermitt hoarse (Rx w/ lozenges, throat drops), he's been traveling w/ his job & doing ok w/o CP SOB etc...     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    Hx Cough> resolved w/ [id number] & just using prn now; + Hycodan, Claritin, Mucinex, MMW- c/o dry throat & encouraged to use lozenges; he had URI treated by TP 1/14 w/esolved...    Hx CP>  Prev cardiac w/u by DrCrenshaw was neg, no recurrent CP complaints, he remains active,  etc...    Chol>  On Crestor5 now; he stopped Simva40 due to muscle cramps which resolved off this med; McCammon 4/14 showed TChol 119, TG 44, HDL 38, LDL 72    Hypothy>  On Synthroid 168mcg/d now>  Labs 4/12 revealed TSH= 0.33 on Synthroid125 so we decr the dose slightly to 112; f/u TSH= 4.60 & 5.46    GI- GERD, IBS, Polyps> on Protonix40Qod & Librax prn; followed by [facility] & had colonoscopy 3/12- neg x for hems & DrM rec f/u 64yrs...    GU>  Hx BPH w/ BOO & prev followed by [id number] on Rapaflo 8mg /d;  He saw DrEskridge 12/13 who notes a slow PSAV c/wBPH & DRE was wnl...    Ortho>  On Mobic7.5 + OTC meds; eval 12/11 by DrNorris for bilat leg pain & cramping, ?min atrophy of left gastroc- he rec tonic water & neurology eval> he saw DrWillis 3/12 (note reviewed)> NCV was normal; EMG c/w mild S1 radiculopathy; he rec MRI- pt decided against the MRI (we did not get f/u note);  Now c/o some right arm/ shoulder symptoms & I have rec eval by DrSypher to sort this out...    Anxiety/ Insomnia>  He has Chlorazepate7.5 for Prn use, and Ambien10 to use for sleep Qhs... We reviewed prob list, meds, xrays and labs> see below for updates >>  LABS 4/14:  FLP- at goals on Cres5;  Chems- wnl;  CBC- wnl;  TSH=5.46 on Synthroid112;  VitD=50;  PSA=2.56     ~  June 11, 2013:  72 y.o. [name] & add-on appt for leg cramps> these occur mostly at night & wake him up; he stopped his Cres5 on his own & he swears that the cramps are better off this med; he recalls that yrs ago he had the same problem from [place] changing to Cres5 resolved the issue until recently;  We discussed the options and decided to leave off the statins for now, try to control Chol w/ diet & exercise, f/u FLP soon...  We reviewed the [employer] suggestions for Tonic Water, yellow mustard, bar of soap in the bed...     We reviewed prob list, meds, xrays and labs> see below for updates >>   ~  October 04, 2013:  72 y.o. [name] & Gregory [name] recently saw DrCrenshaw c/o SOB (eg-  climbing stairs) & dizzy (no assoc symptoms)> known OWR, no change on exam, EKG was normal; they plan f/u 2DEcho & GXT... 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    Hx Cough> resolved w/ [id number] & off now; + Hycodan, Claritin, Mucinex, MMW- c/o dry throat & encouraged to use lozenges...    Hx CP>  Prev cardiac w/u by DrCrenshaw was neg, no recurrent CP complaints, he remains active but notes DOE w/ stairs, ok on level ground, DrCrenshaw plans f/u 2DEcho & GXT soon...    Chol> off Crestor now due to musc cramps; he stopped Simva40 due to muscle cramps which resolved off this med; FLP 10/14 on diet alone showed TChol 155, TG 29, HDL 38, LDL 111    Hypothy>  On Synthroid 142mcg/d now>  Labs 4/12 revealed TSH= 0.33 on Synthroid125 so we decr the dose slightly to 112;  f/u TSH= 4.60 & 5.46    GI- GERD, IBS, Polyps> on Protonix40Qd & Librax prn; followed by [facility] & had colonoscopy 3/12- neg x for hems & DrM rec f/u 65yrs...    GU>  Hx BPH w/ BOO & prev followed by [id number] on Rapaflo 8mg /d;  He saw DrEskridge 12/13 who notes a slow PSAV c/w BPH & DRE was wnl...    Ortho>  On Mobic7.5 + OTC meds; eval 12/11 by DrNorris for bilat leg pain & cramping, ?min atrophy of left gastroc- he rec tonic water & neurology eval> he saw DrWillis 3/12 (note reviewed)> NCV was normal; EMG c/w mild S1 radiculopathy; he rec MRI- pt decided against the MRI (we did not get f/u note)...    Anxiety/ Insomnia>  He has Chlorazepate7.5 for Prn use, and Ambien10 to use for sleep Qhs... 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 LABS 10/14:  FLP- on diet alone looks pretty good w/  LDL=111...  ~  November 21, 2013:  6wk [name] & add-on appt after cardiac eval; he notes incr DOE w/ stairs over the last few months and saw Cards- DrCrenshaw10/14 (note reviewed, last eval 2009 w/ Echo & Myoview); exam ewas neg & they decided to repeat the studies;   EKG 10/14 showed SBrady, rate57, otherw wnl;  2DEcho 10/14 showed normal LV size & wall thickness, norm LVF w/ EF=55-60%, Gr1DD, mildly thickened AoV leaflets, mild MR;  ETT 11/14 showed on treadmill, hypertensive BP response, rare PVC, no ischemia, O2 sats dropped to 87% w/ exercise...  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 Given this mild airflow obstruction we will start Rx w/ Dulera100- 2spBid and plan f/u in 2 mo... Rec to start a gradual exercise program...           Problem List:   OTHER DISEASES OF NASAL CAVITY AND SINUSES - Hx of Osler-Weber-Rendue syndrome/ hereditary telangiectasias... he's had numerous nose bleeds and surgery by Mikael Spray, now followed by Gayla Medicus... left nares is occluded w/ skin graft- no lumen... this is quite uncomfortable to the patient but he's been told there is nothing further that can be done for this... ~  3/11:  notes occas nosebleeds but he is able to handle them... ~  4/12:  No diff in his  pattern of mild nose bleeds & he denies any severe epistaxis episodes... 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 ~  4/14:  He notes infreq bleeding now w/ saline etc... 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   Hx Cough >> see 10/13 eval & rx... Dyspnea w/ O2 sat w/ exercise >>  ~  12/14:  PFTs showed FVC=3.98  (89%), FEV1=2.72 (79%), %1sec=68, and mid-flows= 51% predicted; c/w mild obstructive dis & we decided on a trial of Dulera100-2spBid  OSLER-WEBER-RENDU DISEASE (ICD-448.0) - known hereditary telangiectasia... 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 known GI telangiectasia as well without signif GI bleeding in the past- followed by [facility]... he's also has skin telangiectasis w/ prev laser therapy at [facility]... extensive nasal problems as above... ~  labs 2/09 showed Hg= 17.0 ~  labs 2/10 showed Hg= 16.4 ~  CXR 3/11 is chr incr markings esp RLL- no change, NAD...  ~  Labs 6/11 showed Hg= 15.9 ~  Labs 4/12 showed Hg= 16.0 ~  CXR 10/12 showed mild biapical scarring, RLL AVM, NAD... ~  Labs 4/13 showed Hg= 15.2 ~  CXR 8/13 showed heart at upper lim of norm, increased markings & apical scarring, chr incr markings in RLL- AVM, NAD.[name]. 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 resolved w/ rest & Protonix... EKG showed WNL, prev NuclearStressTest 1/04 at [facility] was neg (no ischemia or infarction and EF= 61%)... Cardiac eval DrCrenshaw w/ 2DEcho 10/09 showing norm LV- no regional wall motion abn, norm LVF w/ EF= 60%, mild dil RA/ RV;  and norm MYOVIEW (no ischemia or infarction, EF= 58%)... ~  10/14: repeat cardiac eval by DrCrenshaw due to DOE w/ stairs; EKG 10/14 showed SBrady, rate57, otherw wnl;  2DEcho 10/14 showed normal LV size & wall thickness, norm LVF w/ EF=55-60%, Gr1DD, mildly thickened AoV leaflets, mild MR;  ETT 11/14 showed on treadmill, hypertensive BP response, rare PVC, no ischemia, O2 sats dropped to 87% w/ exercise  HYPERCHOLESTEROLEMIA, MILD (ICD-272.0) - now  on CRESTOR 5mg /d>  Prev Rx w/ Simva40 but developed muscle cramping which resolved off the med. ~  FLP 6/08 showed TChol 208, TG 75, HDL 34, LDL 139... he preferred diet Rx- ~  FLP 2/09 showed TChol 172, TG 69, HDL 30, LDL 129... he agreed to Crestor10... ~  FLP 8/09 on Crestor10 showed TChol 91, TG 49, HDL 28, LDL 54... rec- keep same. ~  FLP 2/10 on Crestor10 showed TChol 108, TG 56, HDL 31, LDL 66 ~  9/10:  we discussed changing Crestor10 to Simvastatin40 for $$$ reasons... ~  FLP 3/11 on Simva40 showed TChol 105, TG 59, HDL 39, LDL 54... continue same. ~  FLP 4/12 on Simva40 showed TChol 104, TG 62, HDL 29, LDL 63... C/o severe muscle cramps & wants to stop for 34mo> cramping resolved! ~  Referred to [facility] Clinic & they restarted his Crestor  at 5mg /d> tol well so far... ~  FLP 10/12 on Cres5 showed TChol 114, TG 40, HDL 36, LDL 70 ~  FLP 2/13 on Cres5 showed TChol 124, TG 21, HDL 40, LDL 80 ~  FLP 4/14 on Cres5 showed TChol 119, TG 44, HDL 38, LDL 72  ~  6/14: pt stopped Cres5 on his own due to leg cramps & decided to hold statins for now w/ repeat FLP off meds in several months... ~  FLP 10/14 on diet alone showed TChol 155, TG 29, HDL 38, LDL 111   HYPOTHYROIDISM - on SYNTHROID 13mcg/d now; prev 115mcg/d dose was decreased 4/12... ~  labs 2/09 showed TSH= 0.69...  8/09 showed TSH= 0.44... ~  labs 2/10 showed TSH= 0.68 ~  labs 3/11 showed TSH= 0.52 ~  Labs 4/12 on Synthroid125 showed TSH= 0.33... We decided to decr the dose to 112/d. ~  Labs 4/13 on Levo112 showed TSH= 4.60 ~  Labs 4/14 on Levo112 showed TSH= 5.46  GERD, IRRITABLE BOWEL SYNDROME, COLONIC POLYPS - followed by [facility]... on Protonix40 (he tried OTC Zantac 75mg  on his own)... also takes  LIBRAX Prn...  ~  colonoscopy 1/04 w/ divertics, diminutive rectal polyp (adenomatous), hems...  ~  f/u colon 1/07 showed no recurrent polyps... ~  colonoscopy 3/12 at [facility]- neg x for hems & [facility] rec f/u  37yrs...  HYPERTROPHY PROSTATE W/UR OBST & OTH LUTS (ICD-600.01) - followed by ZOXWRUEA on RAPAFLO 8mg /d (stopped Flomax due to side effect- ED)... doing well without LTOS, just complains of nocturia x 1-2 ~  labs 2/09 showed PSA= 0.76 ~  labs 2/10 showed PSA= 0.90 ~  2011 PSA checked by DrDavis (pt states it was OK)... ~  Labs 4/12 showed PSA= 1.22 ~  Labs 4/13 showed PSA= 1.31 ~  10/13: presents c/o incr freq, nocturia x2-4, & weak stream despite Rapaflo; he is intol to Flomax; refer to Urology for further eval=> seen by DrEskridge, no changes made...  LEFT ARM & SHOULDER PAIN >> he was checked by Alinda Sierras but not improving he says; we discussed second opinion at the [facility], DrSypher==> notes reviewed.  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 ~  9/10:  requesting Rx for Robaxin for muscle spasm as needed.  PLANTAR FASCIITIS (ICD-728.71) - c/o pain in his feet secondary to plantar fasciitis eval & Rx from [facility] w/ Celebrex & shots... ~  9/10: we discussed change to [facility] - symptoms improved.  Hx NOCTURNAL LEG CRAMPS >>  ~  This initially occurred several yrs ago while on Simvastatin & the cramps resolved off this med; he was subseq placed on Cres5 & tol well... ~  6/14: he indicates that noct leg cramps returned x1wk & he stopped the Cres5 w/ improvement; he wants to leave off the statins for now; rec to try tonic water/ yellow mustard prn...  Hx of DIZZINESS (ICD-780.4) - sudden episode dizziness Sep09 working at [employer] around [employer] w/ room spinning- lasted and resolved spontaneously, felt light headed afterwards... no LOC, syncope, seizure activity, etc... no CP/ palpit/ SOB/ etc... there were several EMTs in the restaurant and they checked him out- stable and transported to ER... exam was neg-  they did labs: all norm, didn't do scans (can't get MRI due to metallic clamp in sinus), he can't take ASA due to  bleeding from his OWR.[name]. treated w/ MECLIZINE w/ improvement.  ANXIETY (ICD-300.00) & INSOMNIA, CHRONIC (  ZOX-096.04) - he uses CHLORAZEPATE 7.5mg  Prn & AMBIEN10mg Penni Homans for chronic persistant insomnia...  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  Health Maintenance: ~  GI:  followed by [facility] & up to date on colon etc... (last 1/07 & 55yr f/u due 1/12). ~  GU:  followed by [facility] in past; PSAs have all been wnl... ~  Immunizations:  he getsyearly Flu vaccinations in the fall;  OK Pneumovax 03/12/10 at age[age];     Past Surgical History  Procedure Laterality Date  . Varicocelectomy  1991    Dr. Gaynelle Arabian  . 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 Other surgical history      pulse laser for facial telangiectasias inthe past  . Excision left wrist ganglian/ myxoid cyst  05-30-2009  . Cardiovascular stress test  01-14-2003    NO ISCHEMIA / EF 61%/ NORMAL LE WALL MOTION  . Transthoracic echocardiogram  10-17-2008   DR CRENSHAW    NORMAL LVF/ EF 60%/  MILDLY DILATED RIGHT ATRIUM/ VENTRICULE  . 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 Sharyn Lull    Outpatient Encounter Prescriptions as of 11/21/2013  Medication Sig  . acetaminophen (TYLENOL) 500 MG tablet Take 500 mg by mouth as needed. Rotates with ibuprofen/naproxen. Does not take all three at one time.   . clidinium-chlordiazePOXIDE (LIBRAX) 2.5-5 MG per capsule TAKE ONE CAPSULE BY MOUTH 3 TIMES A DAY AS NEEDED FOR ABDOMINAL CRAMPING  . clorazepate (TRANXENE) 7.5 MG tablet Take 1 tablet (7.5 mg total) by mouth 3 (three) times daily as needed.  [name] dextromethorphan-guaiFENesin (MUCINEX DM) 30-600 MG per 12 hr tablet Take 1-2 tablets by mouth every 12 (twelve) hours as needed.  . Diphenhyd-Hydrocort-Nystatin (FIRST-DUKES MOUTHWASH) SUSP garlge and swallow 1 tsp four times daily  as needed  . HYDROcodone-homatropine (HYCODAN) 5-1.5 MG/5ML syrup Take 5 mLs by mouth every 6 (six) hours as needed.  [name] levothyroxine (SYNTHROID, LEVOTHROID) 112 MCG tablet Take 1 tablet (112 mcg total) by mouth daily before breakfast.  . loratadine (CLARITIN) 10 MG tablet Take 1 tablet (10 mg total) by mouth daily. As needed for allergies   . Magnesium 250 MG TABS Take 1 tablet by mouth at bedtime.  . meclizine (ANTIVERT) 25 MG tablet Take 1/2 tablet to 1 whole tablet by mouth 4 times daily as directed for dizziness  . meloxicam (MOBIC) 7.5 MG tablet TAKE 1 TABLET BY MOUTH EVERY DAY AS NEEDED  . Multiple Vitamins-Minerals (CENTRUM) tablet Take 1 tablet by mouth daily.    . naproxen sodium (ANAPROX) 220 MG tablet Take 220 mg by mouth as needed. Rotates with ibuprofen/acetaminophen. Does not take at same time as others.   . pantoprazole (PROTONIX) 40 MG tablet TAKE 1 TABLET BY MOUTH EVERY day  . silodosin (RAPAFLO) 8 MG CAPS capsule Take 1 capsule (8 mg total) by mouth daily with breakfast.  . zolpidem (AMBIEN) 10 MG tablet TAKE 1 TABLET BY MOUTH AT BEDTIME AS NEEDED    Allergies  Allergen Reactions  . Zocor [Simvastatin - High Dose]     Leg cramps     Current Medications, Allergies, Past Medical History, Past Surgical History, Family History, and Social History were reviewed in [employer] record.    Review of Systems       See  HPI - other systems neg except as noted... The patient denies anorexia, fever, weight loss, weight gain, vision loss, decreased hearing, hoarseness, chest pain, syncope, dyspnea on exertion, peripheral edema, prolonged cough, headaches, hemoptysis, abdominal pain, melena, hematochezia, severe indigestion/heartburn, hematuria, incontinence, muscle weakness, suspicious skin lesions, transient blindness, difficulty walking, depression, unusual weight change, abnormal bleeding, enlarged lymph nodes, and angioedema.     Objective:   Physical  Exam      WD, WN, 72 y/o WM in NAD... GENERAL:  Alert & oriented; pleasant & cooperative... HEENT:  [place]/AT, EOM-wnl, PERRLA, EACs-clear, TMs-wnl, NOSE- occluded left nares from skin grafts, MOUTH- mucosal telangiectasias.  NECK:  Supple w/ fairROM; no JVD; normal carotid impulses w/o bruits; no thyromegaly or nodules palpated; no lymphadenopathy. CHEST:  Clear to P & A; without wheezes/ rales/ or rhonchi. HEART:  Regular Rhythm; without murmurs/ rubs/ or gallops. ABDOMEN:  Soft & nontender; normal bowel sounds; no organomegaly or masses detected. EXT: without deformities, mild arthritic changes; no varicose veins/ venous insuffic/ or edema. NEURO:  CN's intact; motor testing normal; sensory testing normal; gait normal & balance OK. DERM:  mult telangiectasias...  RADIOLOGY DATA:  Reviewed in the EPIC EMR & discussed w/ the patient...  LABORATORY DATA:  Reviewed in the EPIC EMR & discussed w/ the patient...   Assessment & Plan:    Hx Cough 10/13 & now Dyspnea on exertion >> as above, we decided on trial of Dulera100- 2sp Bid...  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 CXR w/o change in the RLL AVM.     Hx CP>  Prev cardiac w/u by DrCrenshaw was neg, no recurrent CP complaints, but he notes DOE w/ stairs & DrCrenshaw repeated 2DEcho & ETT as above...     Chol>  Off Cres5 now due to recurrent cramps, on diet alone & FLP is reasonable (LDL=111)...     Hypothy>  On Synthroid 118mcg/d now>  He is clinically & biochem euthyroid...     GI>  Followed by [facility] & had colonoscopy 3/12- neg x for hems & DrM rec f/u 43yrs...     GU>  Hx BPH w/ BOO & prev followed by ZOXWRUEA on Rapaflo 8mg /d; now checked by DrEskridge & stable...     Ortho>  See above- stable on Mobic & OTC analgesics as needed...  LEG CRAMPS >> he is quite sure they are from his  statin Rx & we decided to leave these off for now, try to control on diet/ exercise, f/u FLP in several months...     Anxiety/ Insomnia>  He has Chlorazepate for Prn use, and Ambien to use for sleep Qhs...   Patient's Medications  New Prescriptions   No medications on file  Previous Medications   ACETAMINOPHEN (TYLENOL) 500 MG TABLET    Take 500 mg by mouth as needed. Rotates with ibuprofen/naproxen. Does not take all three at one time.    CLIDINIUM-CHLORDIAZEPOXIDE (LIBRAX) 2.5-5 MG PER CAPSULE    TAKE ONE CAPSULE BY MOUTH 3 TIMES A DAY AS NEEDED FOR ABDOMINAL CRAMPING   CLORAZEPATE (TRANXENE) 7.5 MG TABLET    Take 1 tablet (7.5 mg total) by mouth 3 (three) times daily as needed.   DEXTROMETHORPHAN-GUAIFENESIN (MUCINEX DM) 30-600 MG PER 12 HR TABLET    Take 1-2 tablets by mouth every 12 (twelve) hours as needed.   DIPHENHYD-HYDROCORT-NYSTATIN (FIRST-DUKES MOUTHWASH) SUSP  garlge and swallow 1 tsp four times daily as needed   HYDROCODONE-HOMATROPINE (HYCODAN) 5-1.5 MG/5ML SYRUP    Take 5 mLs by mouth every 6 (six) hours as needed.   LEVOTHYROXINE (SYNTHROID, LEVOTHROID) 112 MCG TABLET    Take 1 tablet (112 mcg total) by mouth daily before breakfast.   LORATADINE (CLARITIN) 10 MG TABLET    Take 1 tablet (10 mg total) by mouth daily. As needed for allergies    MAGNESIUM 250 MG TABS    Take 1 tablet by mouth at bedtime.   MECLIZINE (ANTIVERT) 25 MG TABLET    Take 1/2 tablet to 1 whole tablet by mouth 4 times daily as directed for dizziness   MELOXICAM (MOBIC) 7.5 MG TABLET    TAKE 1 TABLET BY MOUTH EVERY DAY AS NEEDED   MOMETASONE-FORMOTEROL (DULERA) 100-5 MCG/ACT AERO    Inhale 2 puffs into the lungs 2 (two) times daily. Rinse after each use   MULTIPLE VITAMINS-MINERALS (CENTRUM) TABLET    Take 1 tablet by mouth daily.     NAPROXEN SODIUM (ANAPROX) 220 MG TABLET    Take 220 mg by mouth as needed. Rotates with ibuprofen/acetaminophen. Does not take at same time as others.    PANTOPRAZOLE  (PROTONIX) 40 MG TABLET    TAKE 1 TABLET BY MOUTH EVERY day   SILODOSIN (RAPAFLO) 8 MG CAPS CAPSULE    Take 1 capsule (8 mg total) by mouth daily with breakfast.   ZOLPIDEM (AMBIEN) 10 MG TABLET    TAKE 1 TABLET BY MOUTH AT BEDTIME AS NEEDED  Modified Medications   No medications on file  Discontinued Medications   No medications on file

## 2013-11-21 NOTE — Patient Instructions (Signed)
Today we updated your med list in our EPIC system...    Continue your current medications the same...  We decided to add DULERA inhaler- 2 puffs twice daily (and rinse after weach twice daily treatment)...  Put a little incline on your home treadmill & continue to exercise...  Call for any clinical worsening or problems...  Let's plan a recheck in 6-8 weeks time.Marland KitchenMarland Kitchen

## 2014-01-16 ENCOUNTER — Ambulatory Visit (INDEPENDENT_AMBULATORY_CARE_PROVIDER_SITE_OTHER): Payer: Medicare Other | Admitting: Pulmonary Disease

## 2014-01-16 ENCOUNTER — Encounter: Payer: Self-pay | Admitting: Pulmonary Disease

## 2014-01-16 VITALS — BP 124/62 | HR 77 | Temp 96.8°F | Ht 70.0 in | Wt 192.2 lb

## 2014-01-16 DIAGNOSIS — J45909 Unspecified asthma, uncomplicated: Secondary | ICD-10-CM

## 2014-01-16 DIAGNOSIS — F411 Generalized anxiety disorder: Secondary | ICD-10-CM

## 2014-01-16 DIAGNOSIS — J4530 Mild persistent asthma, uncomplicated: Secondary | ICD-10-CM

## 2014-01-16 DIAGNOSIS — I780 Hereditary hemorrhagic telangiectasia: Secondary | ICD-10-CM

## 2014-01-16 DIAGNOSIS — G4700 Insomnia, unspecified: Secondary | ICD-10-CM

## 2014-01-16 DIAGNOSIS — R0609 Other forms of dyspnea: Secondary | ICD-10-CM

## 2014-01-16 DIAGNOSIS — R0989 Other specified symptoms and signs involving the circulatory and respiratory systems: Secondary | ICD-10-CM

## 2014-01-16 DIAGNOSIS — J3489 Other specified disorders of nose and nasal sinuses: Secondary | ICD-10-CM

## 2014-01-16 HISTORY — DX: Mild persistent asthma, uncomplicated: J45.30

## 2014-01-16 NOTE — Progress Notes (Signed)
Subjective:    Patient ID: Gregory [name], male    DOB: [birth year], 73 y.o.   MRN: [id number]  HPI 73 y/o WM here for a follow up visit... he has multiple medical problems including:  Hx OWR syndrome;  Hx atypCP;  Hyperchol;  Hypothyroidism;  GERD/ Divertics/ IBS/ Colon polyps;  BPH/ BOO;  DJD/ LBP/ Plantar fasciitis;  Hx dizziness;  Anxiety & chronic persistant insomnia...  ~  March 27, 2012:  48mo [name] & Gregory [name] is stable- no new complaints or concerns... He continues to f/u w/ the [facility]id [facility] & is taking Crestor 5mg /d (see labs below)... 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    Hx CP>  Prev cardiac w/u by DrCrenshaw was neg, no recurrent CP complaints, he remains active, etc...    Chol>  On Crestor5 now; he stopped Simva40 due to muscle cramps which improved/ resolved off the med; went to Lipid clinic & they tried Cres5- tol well so far!  FLP last= 2/13 & showed TChol 124, TG 21, HDL 40, LDL 80    Hypothy>  On Synthroid 18mcg/d now>  Labs 4/12 revealed TSH= 0.33 on Synthroid125 so we decr the dose slightly to 112.[name]    GI>  Followed by [facility] & had colonoscopy 3/12 at [facility]- neg x for hems & DrM rec f/u 1yrs...    GU>  Hx BPH w/ BOO & followed by [id number] on Rapaflo 8mg /d;  PSA done 4/12 = 1.22...    Ortho>  eval 12/11 by DrNorris for bilat leg pain & cramping, ?min atrophy of left gastroc- he rec tonic water & neurology eval> he saw DrWillis 3/12 (note reviewed)> NCV was normal; EMG c/w mild S1 radiculopathy; he rec MRI- pending (we did not get f/u note);  Now c/o some right arm/ shoulder symptoms & I  have rec eval by DrSypher to sort this out...    Anxiety/ Insomnia>  He has Chlorazepate for Prn use, and Ambien to use for sleep Qhs... We reviewed prob list, meds, xrays and labs> see below for updates >> LABS 4/13:  Chems- wnl;  CBC- wnl;  TSH=4.60;  PSA=1.31;  UA- clear  ~  September 27, 2012:  25mo [name] & Gregory [name]'s CC= chronic cough, mostly dry, some congestion, little phlegm/ no blood/ etc; he saw TP 8/13 & given Hydromet/ Delsym/ Mobic/ Lozenges & these helped but prob is persistent;  Chest is clear, labs 4/13 normal, CXR 8/13 stable;  We reviewed options and decided to try Advair-HFA 115/21- 2puffsBid plus cough drops etc...     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    We reviewed  prob list, meds, xrays and labs> see below for updates >> OK Flu shot today...  ~  March 28, 2013:  30mo [name] & Gregory [name] has had several interval complaints> skin cancer removed from his forehead, cortisone shot in his knee, c/o dry throat & intermitt hoarse (Rx w/ lozenges, throat drops), he's been traveling w/ his job & doing ok w/o CP SOB etc...     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    Hx Cough> resolved w/ [id number] & just using prn now; + Hycodan, Claritin, Mucinex, MMW- c/o dry throat & encouraged to use lozenges; he had URI treated by TP 1/14 w/esolved...    Hx CP>  Prev cardiac w/u by DrCrenshaw was neg, no recurrent CP complaints, he remains active,  etc...    Chol>  On Crestor5 now; he stopped Simva40 due to muscle cramps which resolved off this med; [place] 4/14 showed TChol 119, TG 44, HDL 38, LDL 72    Hypothy>  On Synthroid 168mcg/d now>  Labs 4/12 revealed TSH= 0.33 on Synthroid125 so we decr the dose slightly to 112; f/u TSH= 4.60 & 5.46    GI- GERD, IBS, Polyps> on Protonix40Qod & Librax prn; followed by [facility] & had colonoscopy 3/12- neg x for hems & DrM rec f/u 64yrs...    GU>  Hx BPH w/ BOO & prev followed by [id number] on Rapaflo 8mg /d;  He saw DrEskridge 12/13 who notes a slow PSAV c/wBPH & DRE was wnl...    Ortho>  On Mobic7.5 + OTC meds; eval 12/11 by DrNorris for bilat leg pain & cramping, ?min atrophy of left gastroc- he rec tonic water & neurology eval> he saw DrWillis 3/12 (note reviewed)> NCV was normal; EMG c/w mild S1 radiculopathy; he rec MRI- pt decided against the MRI (we did not get f/u note);  Now c/o some right arm/ shoulder symptoms & I have rec eval by DrSypher to sort this out...    Anxiety/ Insomnia>  He has Chlorazepate7.5 for Prn use, and Ambien10 to use for sleep Qhs... We reviewed prob list, meds, xrays and labs> see below for updates >>  LABS 4/14:  FLP- at goals on Cres5;  Chems- wnl;  CBC- wnl;  TSH=5.46 on Synthroid112;  VitD=50;  PSA=2.56     ~  June 11, 2013:  2-39mo [name] & add-on appt for leg cramps> these occur mostly at night & wake him up; he stopped his Cres5 on his own & he swears that the cramps are better off this med; he recalls that yrs ago he had the same problem from [place] changing to Cres5 resolved the issue until recently;  We discussed the options and decided to leave off the statins for now, try to control Chol w/ diet & exercise, f/u FLP soon...  We reviewed the [employer] suggestions for Tonic Water, yellow mustard, bar of soap in the bed...     We reviewed prob list, meds, xrays and labs> see below for updates >>   ~  October 04, 2013:  67mo [name] & Gregory [name] recently saw DrCrenshaw c/o SOB (eg-  climbing stairs) & dizzy (no assoc symptoms)> known OWR, no change on exam, EKG was normal; they plan f/u 2DEcho & GXT... 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    Hx Cough> resolved w/ [id number] & off now; + Hycodan, Claritin, Mucinex, MMW- c/o dry throat & encouraged to use lozenges...    Hx CP>  Prev cardiac w/u by DrCrenshaw was neg, no recurrent CP complaints, he remains active but notes DOE w/ stairs, ok on level ground, DrCrenshaw plans f/u 2DEcho & GXT soon...    Chol> off Crestor now due to musc cramps; he stopped Simva40 due to muscle cramps which resolved off this med; FLP 10/14 on diet alone showed TChol 155, TG 29, HDL 38, LDL 111    Hypothy>  On Synthroid 135mcg/d now>  Labs 4/12 revealed TSH= 0.33 on Synthroid125 so we decr the dose slightly to 112;  f/u TSH= 4.60 & 5.46    GI- GERD, IBS, Polyps> on Protonix40Qd & Librax prn; followed by [facility] & had colonoscopy 3/12- neg x for hems & DrM rec f/u 39yrs...    GU>  Hx BPH w/ BOO & prev followed by [id number] on Rapaflo 8mg /d;  He saw DrEskridge 12/13 who notes a slow PSAV c/w BPH & DRE was wnl...    Ortho>  On Mobic7.5 + OTC meds; eval 12/11 by DrNorris for bilat leg pain & cramping, ?min atrophy of left gastroc- he rec tonic water & neurology eval> he saw DrWillis 3/12 (note reviewed)> NCV was normal; EMG c/w mild S1 radiculopathy; he rec MRI- pt decided against the MRI (we did not get f/u note)...    Anxiety/ Insomnia>  He has Chlorazepate7.5 for Prn use, and Ambien10 to use for sleep Qhs... 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 LABS 10/14:  FLP- on diet alone looks pretty good w/  LDL=111...  ~  November 21, 2013:  6wk [name] & add-on appt after cardiac eval; he notes incr DOE w/ stairs over the last few months and saw Cards- DrCrenshaw10/14 (note reviewed, last eval 2009 w/ Echo & Myoview); exam ewas neg & they decided to repeat the studies;   EKG 10/14 showed SBrady, rate57, otherw wnl;  2DEcho 10/14 showed normal LV size & wall thickness, norm LVF w/ EF=55-60%, Gr1DD, mildly thickened AoV leaflets, mild MR;  ETT 11/14 showed 61min on treadmill, hypertensive BP response, rare PVC, no ischemia, O2 sats dropped to 87% w/ exercise...  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 Given this mild airflow obstruction we will start Rx w/ Dulera100- 2spBid and plan f/u in 2 mo... Rec to start a gradual exercise program...  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  We decided to continue the same Rx & plan for now...           Problem List:   OTHER DISEASES OF NASAL CAVITY AND SINUSES - Hx of Osler-Weber-Rendue syndrome/ hereditary telangiectasias... he's had numerous nose bleeds  and surgery by Etter Sjogren, now followed by Barbaraann Faster... left nares is occluded w/ skin graft- no lumen... this is quite uncomfortable to the patient but he's been told there is nothing further that can be done for this... ~  3/11:  notes occas nosebleeds but he is able to handle them... ~  4/12:  No diff in his pattern of mild nose bleeds & he denies any severe epistaxis episodes... 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 ~  4/14:  He notes infreq bleeding now w/ saline etc... 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   Hx Cough >> see 10/13 eval & rx... Dyspnea w/ O2 desat w/ exercise >>  ~  12/14:  PFTs showed FVC=3.98 (89%), FEV1=2.72 (79%), %1sec=68, and mid-flows= 51% predicted; c/w mild obstructive dis & we decided on a trial of Dulera100-2spBid  OSLER-WEBER-RENDU DISEASE (ICD-448.0) - known hereditary telangiectasia... 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 known GI telangiectasia as well without signif GI bleeding in the past- followed by [facility]... he's also has skin telangiectasis w/ prev laser therapy at [facility]... extensive nasal problems as above... ~  labs 2/09 showed Hg= 17.0 ~  labs 2/10 showed Hg= 16.4 ~  CXR 3/11 is chr incr markings esp RLL- no change, NAD...  ~  Labs 6/11 showed Hg= 15.9 ~  Labs 4/12 showed Hg= 16.0 ~  CXR 10/12 showed mild biapical scarring, RLL AVM, NAD... ~  Labs 4/13 showed Hg= 15.2 ~  CXR 8/13 showed heart at upper lim of norm, increased markings & apical scarring, chr incr markings in RLL- AVM, NAD.[name]. 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 resolved w/ rest & Protonix... EKG showed WNL, prev [facility]St[facility]Test 1/04 at [facility] was neg (no ischemia or infarction and EF= 61%)... Cardiac eval DrCrenshaw w/ 2DEcho 10/09 showing norm LV- no regional wall motion abn, norm LVF w/ EF= 60%, mild dil RA/ RV;  and norm MYOVIEW (no ischemia or infarction, EF= 58%)... ~  10/14: repeat cardiac eval by DrCrenshaw due to [place] w/ stairs; EKG 10/14 showed SBrady, rate57, otherw wnl;  2DEcho 10/14 showed normal LV size & wall thickness, norm LVF w/ EF=55-60%, Gr1DD, mildly thickened AoV leaflets, mild MR;  ETT 11/14 showed 80min on treadmill, hypertensive BP response, rare PVC, no ischemia, O2 sats dropped to 87% w/ exercise  HYPERCHOLESTEROLEMIA, MILD (ICD-272.0) - now on CRESTOR 5mg /d>  Prev Rx w/ Simva40 but developed muscle cramping which resolved off the med. ~  FLP 6/08 showed TChol 208, TG 75, HDL 34, LDL 139... he preferred diet Rx- ~  FLP 2/09 showed TChol 172, TG 69, HDL 30, LDL 129... he agreed to [place]... ~  [place] 8/09 on Crestor10 showed TChol 91, TG 49, HDL 28, LDL 54... rec- keep same. ~  FLP 2/10 on Crestor10 showed TChol 108, TG 56, HDL 31, LDL 66 ~  9/10:  we discussed changing Crestor10 to Simvastatin40 for $$$ reasons... ~  [place] 3/11 on Simva40 showed TChol 105, TG 59, HDL 39, LDL 54... continue same. ~  [place] 4/12 on Simva40 showed TChol 104, TG 62, HDL 29, LDL 63... C/o severe muscle cramps & wants to stop for 43mo> cramping resolved! ~  Referred to [facility]id Clinic & they restarted his Crestor at 5mg /d> tol well so far... ~  FLP 10/12 on Cres5 showed TChol 114, TG 40, HDL 36, LDL 70 ~  FLP 2/13 on Cres5 showed TChol 124, TG 21, HDL 40, LDL 80 ~  FLP 4/14 on Cres5 showed TChol 119, TG 44, HDL 38, LDL 72  ~  6/14: pt stopped Cres5 on his own due to leg cramps & decided to hold statins for now w/ repeat FLP off meds in several months... ~   FLP 10/14 on diet alone showed TChol 155, TG 29, HDL 38, LDL 111   HYPOTHYROIDISM - on SYNTHROID 176mcg/d now; prev 133mcg/d dose was decreased 4/12... ~  labs 2/09 showed TSH= 0.69...  8/09 showed TSH= 0.44... ~  labs 2/10 showed TSH= 0.68 ~  labs 3/11 showed TSH= 0.52 ~  Labs 4/12 on Synthroid125 showed TSH= 0.33... We decided to decr the dose to 112/d. ~  Labs 4/13 on Levo112 showed TSH= 4.60 ~  Labs 4/14 on Levo112 showed TSH= 5.46  GERD, IRRITABLE BOWEL SYNDROME, COLONIC POLYPS - followed by [facility]... on Protonix40 (he tried OTC Zantac 75mg  on his own)... also takes  [place]...  ~  colonoscopy 1/04 w/ divertics, diminutive rectal polyp (adenomatous), hems...  ~  f/u colon 1/07 showed no recurrent polyps... ~  colonoscopy 3/12 at [facility]- neg x for hems & [facility] rec f/u 56yrs...  HYPERTROPHY PROSTATE W/UR OBST & OTH LUTS (ICD-600.01) - followed by [id number] on RAPAFLO 8mg /d (stopped Flomax due to side effect- ED)... doing well without LTOS, just complains of nocturia x 1-2 ~  labs 2/09 showed PSA= 0.76 ~  labs 2/10 showed PSA= 0.90 ~  2011 PSA checked by DrDavis (pt states it was OK)... ~  Labs 4/12 showed PSA= 1.22 ~  Labs 4/13 showed PSA= 1.31 ~  10/13: presents c/o incr freq, nocturia x2-4, & weak stream despite Rapaflo; he is intol to Flomax; refer to Urology for further eval=> seen by DrEskridge, no changes made...  LEFT ARM & SHOULDER PAIN >> he was checked by Lacie Scotts but not improving he says; we discussed second opinion at the [facility], DrSypher==> notes reviewed.  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 ~  9/10:  requesting Rx for Robaxin for muscle spasm as needed.  PLANTAR FASCIITIS (ICD-728.71) - c/o pain in his feet secondary to plantar fasciitis eval & Rx from [facility] w/ Celebrex & shots... ~  9/10: we discussed change to [facility] - symptoms improved.  Hx NOCTURNAL LEG CRAMPS >>  ~  This initially occurred several yrs ago while on Simvastatin & the cramps resolved off this med; he was subseq placed on Cres5 & tol well... ~  6/14: he indicates that noct leg cramps returned x1wk & he stopped the Cres5 w/ improvement; he wants to leave off the statins for now; rec to try tonic water/ yellow mustard prn...  Hx of DIZZINESS (ICD-780.4) - sudden episode dizziness Sep09 working at [employer] around [employer] w/ room spinning- lasted 7min and resolved spontaneously, felt light headed afterwards... no LOC, syncope, seizure activity, etc... no CP/ palpit/ SOB/ etc... there were several EMTs in the restaurant and they checked him out- stable and transported to ER... exam was neg-  they did labs: all norm, didn't do scans (can't get MRI due to metallic clamp in sinus), he can't take ASA due to bleeding from his OWR.[name]. treated w/ MECLIZINE w/ improvement.  ANXIETY (ICD-300.00) & INSOMNIA, CHRONIC (ICD-307.42) - he uses CHLORAZEPATE 7.5mg  Prn & AMBIEN10mg Maudie Flakes for chronic persistant insomnia...  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  Health Maintenance: ~  GI:  followed by [facility] & up to date on colon etc... (last 1/07 & 21yr f/u due 1/12). ~  GU:  followed by [facility] in past; PSAs have all been wnl... ~  Immunizations:  he getsyearly Flu vaccinations in the fall;  OK Pneumovax 03/12/10 at age[age];     Past Surgical History  Procedure Laterality Date  . Varicocelectomy  1991    Dr. Tresa Endo  . 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 Other surgical history      pulse laser for facial telangiectasias inthe past  . Excision left wrist ganglian/ myxoid cyst  05-30-2009  . Cardiovascular stress test  01-14-2003    NO ISCHEMIA / EF 61%/ NORMAL LE WALL MOTION  . Transthoracic echocardiogram  10-17-2008   DR CRENSHAW    NORMAL LVF/ EF 60%/   MILDLY DILATED RIGHT ATRIUM/ VENTRICULE  . 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 Renda Rolls    Outpatient Encounter Prescriptions as of 01/16/2014  Medication Sig  . acetaminophen (TYLENOL) 500 MG tablet Take 500 mg by mouth as needed. Rotates with ibuprofen/naproxen. Does not take all three at one time.   . clidinium-chlordiazePOXIDE (LIBRAX) 2.5-5 MG per capsule TAKE ONE CAPSULE BY MOUTH 3 TIMES A DAY AS NEEDED FOR ABDOMINAL CRAMPING  . clorazepate (TRANXENE) 7.5 MG tablet Take 1 tablet (7.5 mg total) by mouth 3 (three) times daily as needed.  [name] dextromethorphan-guaiFENesin (MUCINEX DM) 30-600 MG per 12 hr tablet Take 1-2 tablets by mouth every 12 (twelve) hours as needed.  . Diphenhyd-Hydrocort-Nystatin (FIRST-DUKES MOUTHWASH) SUSP garlge and swallow 1 tsp four times daily as needed  . HYDROcodone-homatropine (HYCODAN) 5-1.5 MG/5ML syrup Take 5 mLs by mouth every 6 (six) hours as needed.  [name] levothyroxine (SYNTHROID, LEVOTHROID) 112 MCG tablet Take 1 tablet (112 mcg total) by mouth daily before breakfast.  . loratadine (CLARITIN) 10 MG tablet Take 1 tablet (10 mg total) by mouth daily. As needed for allergies   . Magnesium 250 MG TABS Take 1 tablet by mouth at bedtime.  . meclizine (ANTIVERT) 25 MG tablet Take 1/2 tablet to 1 whole tablet by mouth 4 times daily as directed for dizziness  . meloxicam (MOBIC) 7.5 MG tablet TAKE  1 TABLET BY MOUTH EVERY DAY AS NEEDED  . mometasone-formoterol (DULERA) 100-5 MCG/ACT AERO Inhale 2 puffs into the lungs 2 (two) times daily. Rinse after each use  . Multiple Vitamins-Minerals (CENTRUM) tablet Take 1 tablet by mouth daily.    . naproxen sodium (ANAPROX) 220 MG tablet Take 220 mg by mouth as needed. Rotates with ibuprofen/acetaminophen. Does not take at same time as others.   . pantoprazole (PROTONIX) 40 MG tablet TAKE 1 TABLET BY MOUTH EVERY day  . silodosin (RAPAFLO) 8 MG CAPS capsule Take 1 capsule (8 mg total) by mouth daily with  breakfast.  . zolpidem (AMBIEN) 10 MG tablet TAKE 1 TABLET BY MOUTH AT BEDTIME AS NEEDED    Allergies  Allergen Reactions  . Zocor [Simvastatin - High Dose]     Leg cramps     Current Medications, Allergies, Past Medical History, Past Surgical History, Family History, and Social History were reviewed in [employer] record.    Review of Systems       See HPI - other systems neg except as noted... The patient denies anorexia, fever, weight loss, weight gain, vision loss, decreased hearing, hoarseness, chest pain, syncope, dyspnea on exertion, peripheral edema, prolonged cough, headaches, hemoptysis, abdominal pain, melena, hematochezia, severe indigestion/heartburn, hematuria, incontinence, muscle weakness, suspicious skin lesions, transient blindness, difficulty walking, depression, unusual weight change, abnormal bleeding, enlarged lymph nodes, and angioedema.     Objective:   Physical Exam      WD, WN, 73 y/o WM in NAD... GENERAL:  Alert & oriented; pleasant & cooperative... HEENT:  [place]/AT, EOM-wnl, PERRLA, EACs-clear, TMs-wnl, NOSE- occluded left nares from skin grafts, MOUTH- mucosal telangiectasias.  NECK:  Supple w/ fairROM; no JVD; normal carotid impulses w/o bruits; no thyromegaly or nodules palpated; no lymphadenopathy. CHEST:  Clear to P & A; without wheezes/ rales/ or rhonchi. HEART:  Regular Rhythm; without murmurs/ rubs/ or gallops. ABDOMEN:  Soft & nontender; normal bowel sounds; no organomegaly or masses detected. EXT: without deformities, mild arthritic changes; no varicose veins/ venous insuffic/ or edema. NEURO:  CN's intact; motor testing normal; sensory testing normal; gait normal & balance OK. DERM:  mult telangiectasias...  RADIOLOGY DATA:  Reviewed in the EPIC EMR & discussed w/ the patient...  LABORATORY DATA:  Reviewed in the EPIC EMR & discussed w/ the patient...   Assessment & Plan:    Hx Cough 10/13 & now Dyspnea on exertion >>  as above, we decided on trial of Dulera100- 2sp Bid; he seems sl improved on this & his exercise program, continue same for now...  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 CXR w/o change in the RLL AVM but this could certainly be an issue w/ his O2 desat w/ exercise & may need to be addressed if he gets worse...     Hx CP>  Prev cardiac w/u by DrCrenshaw was neg, no recurrent CP complaints, but he notes DOE w/ stairs & DrCrenshaw repeated 2DEcho & ETT as above...     Chol>  Off Cres5 now due to recurrent cramps, on diet alone & FLP is reasonable (LDL=111)...     Hypothy>  On Synthroid 156mcg/d now>  He is clinically & biochem euthyroid...     GI>  Followed by [facility] & had colonoscopy 3/12- neg x for hems & DrM rec f/u 44yrs.Marland KitchenMarland Kitchen  GU>  Hx BPH w/ BOO & prev followed by [id number] on Rapaflo 8mg /d; now checked by DrEskridge & stable...     Ortho>  See above- stable on Mobic & OTC analgesics as needed...  LEG CRAMPS >> he is quite sure they are from his statin Rx & we decided to leave these off for now, try to control on diet/ exercise, f/u FLP in several months...     Anxiety/ Insomnia>  He has Chlorazepate for Prn use, and Ambien to use for sleep Qhs...   Patient's Medications  New Prescriptions   No medications on file  Previous Medications   ACETAMINOPHEN (TYLENOL) 500 MG TABLET    Take 500 mg by mouth as needed. Rotates with ibuprofen/naproxen. Does not take all three at one time.    CLIDINIUM-CHLORDIAZEPOXIDE (LIBRAX) 2.5-5 MG PER CAPSULE    TAKE ONE CAPSULE BY MOUTH 3 TIMES A DAY AS NEEDED FOR ABDOMINAL CRAMPING   CLORAZEPATE (TRANXENE) 7.5 MG TABLET    Take 1 tablet (7.5 mg total) by mouth 3 (three) times daily as needed.   DEXTROMETHORPHAN-GUAIFENESIN (MUCINEX DM) 30-600 MG PER 12 HR TABLET    Take 1-2 tablets by mouth every 12 (twelve)  hours as needed.   DIPHENHYD-HYDROCORT-NYSTATIN (FIRST-DUKES MOUTHWASH) SUSP    garlge and swallow 1 tsp four times daily as needed   HYDROCODONE-HOMATROPINE (HYCODAN) 5-1.5 MG/5ML SYRUP    Take 5 mLs by mouth every 6 (six) hours as needed.   LEVOTHYROXINE (SYNTHROID, LEVOTHROID) 112 MCG TABLET    Take 1 tablet (112 mcg total) by mouth daily before breakfast.   LORATADINE (CLARITIN) 10 MG TABLET    Take 1 tablet (10 mg total) by mouth daily. As needed for allergies    MAGNESIUM 250 MG TABS    Take 1 tablet by mouth at bedtime.   MECLIZINE (ANTIVERT) 25 MG TABLET    Take 1/2 tablet to 1 whole tablet by mouth 4 times daily as directed for dizziness   MELOXICAM (MOBIC) 7.5 MG TABLET    TAKE 1 TABLET BY MOUTH EVERY DAY AS NEEDED   MOMETASONE-FORMOTEROL (DULERA) 100-5 MCG/ACT AERO    Inhale 2 puffs into the lungs 2 (two) times daily. Rinse after each use   MULTIPLE VITAMINS-MINERALS (CENTRUM) TABLET    Take 1 tablet by mouth daily.     NAPROXEN SODIUM (ANAPROX) 220 MG TABLET    Take 220 mg by mouth as needed. Rotates with ibuprofen/acetaminophen. Does not take at same time as others.    PANTOPRAZOLE (PROTONIX) 40 MG TABLET    TAKE 1 TABLET BY MOUTH EVERY day   SILODOSIN (RAPAFLO) 8 MG CAPS CAPSULE    Take 1 capsule (8 mg total) by mouth daily with breakfast.   ZOLPIDEM (AMBIEN) 10 MG TABLET    TAKE 1 TABLET BY MOUTH AT BEDTIME AS NEEDED  Modified Medications   No medications on file  Discontinued Medications   No medications on file

## 2014-01-16 NOTE — Patient Instructions (Signed)
Today we updated your med list in our EPIC system...    Continue your current medications the same...  Continue the Dulera100- 2sp twice daily...  Call for any questions...  Let's plan a follow up visit in 56mo, sooner if needed for problems.Marland KitchenMarland Kitchen

## 2014-02-21 ENCOUNTER — Emergency Department (HOSPITAL_COMMUNITY): Payer: Medicare Other

## 2014-02-21 ENCOUNTER — Ambulatory Visit (INDEPENDENT_AMBULATORY_CARE_PROVIDER_SITE_OTHER): Payer: Medicare Other | Admitting: Adult Health

## 2014-02-21 ENCOUNTER — Emergency Department (HOSPITAL_COMMUNITY)
Admission: EM | Admit: 2014-02-21 | Discharge: 2014-02-21 | Disposition: A | Discharge status: Home / Self Care | Payer: Medicare Other | Attending: Emergency Medicine | Admitting: Emergency Medicine

## 2014-02-21 ENCOUNTER — Encounter (HOSPITAL_COMMUNITY): Payer: Self-pay | Admitting: Emergency Medicine

## 2014-02-21 ENCOUNTER — Encounter: Payer: Self-pay | Admitting: Adult Health

## 2014-02-21 VITALS — BP 114/56 | Temp 97.2°F | Ht 70.0 in | Wt 189.6 lb

## 2014-02-21 DIAGNOSIS — Z79899 Other long term (current) drug therapy: Secondary | ICD-10-CM | POA: Insufficient documentation

## 2014-02-21 DIAGNOSIS — Z8739 Personal history of other diseases of the musculoskeletal system and connective tissue: Secondary | ICD-10-CM | POA: Insufficient documentation

## 2014-02-21 DIAGNOSIS — Z87448 Personal history of other diseases of urinary system: Secondary | ICD-10-CM | POA: Insufficient documentation

## 2014-02-21 DIAGNOSIS — K219 Gastro-esophageal reflux disease without esophagitis: Secondary | ICD-10-CM | POA: Insufficient documentation

## 2014-02-21 DIAGNOSIS — Z85828 Personal history of other malignant neoplasm of skin: Secondary | ICD-10-CM | POA: Insufficient documentation

## 2014-02-21 DIAGNOSIS — E860 Dehydration: Secondary | ICD-10-CM | POA: Insufficient documentation

## 2014-02-21 DIAGNOSIS — F411 Generalized anxiety disorder: Secondary | ICD-10-CM | POA: Insufficient documentation

## 2014-02-21 DIAGNOSIS — R0902 Hypoxemia: Secondary | ICD-10-CM | POA: Insufficient documentation

## 2014-02-21 DIAGNOSIS — Z8719 Personal history of other diseases of the digestive system: Secondary | ICD-10-CM | POA: Insufficient documentation

## 2014-02-21 DIAGNOSIS — R42 Dizziness and giddiness: Secondary | ICD-10-CM | POA: Insufficient documentation

## 2014-02-21 DIAGNOSIS — R55 Syncope and collapse: Secondary | ICD-10-CM | POA: Insufficient documentation

## 2014-02-21 DIAGNOSIS — R197 Diarrhea, unspecified: Secondary | ICD-10-CM | POA: Insufficient documentation

## 2014-02-21 DIAGNOSIS — G479 Sleep disorder, unspecified: Secondary | ICD-10-CM | POA: Insufficient documentation

## 2014-02-21 DIAGNOSIS — E039 Hypothyroidism, unspecified: Secondary | ICD-10-CM | POA: Insufficient documentation

## 2014-02-21 DIAGNOSIS — R1110 Vomiting, unspecified: Secondary | ICD-10-CM | POA: Insufficient documentation

## 2014-02-21 DIAGNOSIS — R0989 Other specified symptoms and signs involving the circulatory and respiratory systems: Secondary | ICD-10-CM

## 2014-02-21 DIAGNOSIS — Z9889 Other specified postprocedural states: Secondary | ICD-10-CM | POA: Insufficient documentation

## 2014-02-21 DIAGNOSIS — M129 Arthropathy, unspecified: Secondary | ICD-10-CM | POA: Insufficient documentation

## 2014-02-21 DIAGNOSIS — Z8709 Personal history of other diseases of the respiratory system: Secondary | ICD-10-CM | POA: Insufficient documentation

## 2014-02-21 DIAGNOSIS — R0609 Other forms of dyspnea: Secondary | ICD-10-CM

## 2014-02-21 LAB — CBC WITH DIFFERENTIAL/PLATELET
BASOS ABS: 0 10*3/uL (ref 0.0–0.1)
Basophils Relative: 0 % (ref 0–1)
Eosinophils Absolute: 0 10*3/uL (ref 0.0–0.7)
Eosinophils Relative: 0 % (ref 0–5)
HEMATOCRIT: 40.6 % (ref 39.0–52.0)
Hemoglobin: 13.6 g/dL (ref 13.0–17.0)
Lymphocytes Relative: 4 % — ABNORMAL LOW (ref 12–46)
Lymphs Abs: 0.5 10*3/uL — ABNORMAL LOW (ref 0.7–4.0)
MCH: 26 pg (ref 26.0–34.0)
MCHC: 33.5 g/dL (ref 30.0–36.0)
MCV: 77.6 fL — ABNORMAL LOW (ref 78.0–100.0)
Monocytes Absolute: 0.6 10*3/uL (ref 0.1–1.0)
Monocytes Relative: 5 % (ref 3–12)
NEUTROS ABS: 11.4 10*3/uL — AB (ref 1.7–7.7)
NEUTROS PCT: 91 % — AB (ref 43–77)
Platelets: 268 10*3/uL (ref 150–400)
RBC: 5.23 MIL/uL (ref 4.22–5.81)
RDW: 16.2 % — AB (ref 11.5–15.5)
WBC: 12.5 10*3/uL — AB (ref 4.0–10.5)

## 2014-02-21 LAB — BASIC METABOLIC PANEL
BUN: 26 mg/dL — ABNORMAL HIGH (ref 6–23)
CHLORIDE: 99 meq/L (ref 96–112)
CO2: 23 mEq/L (ref 19–32)
Calcium: 9 mg/dL (ref 8.4–10.5)
Creatinine, Ser: 1.23 mg/dL (ref 0.50–1.35)
GFR, EST AFRICAN AMERICAN: 66 mL/min — AB (ref >90)
GFR, EST NON AFRICAN AMERICAN: 57 mL/min — AB (ref >90)
Glucose, Bld: 120 mg/dL — ABNORMAL HIGH (ref 70–99)
POTASSIUM: 4.8 meq/L (ref 3.7–5.3)
SODIUM: 135 meq/L — AB (ref 137–147)

## 2014-02-21 LAB — CBG MONITORING, ED: Glucose-Capillary: 117 mg/dL — ABNORMAL HIGH (ref 70–99)

## 2014-02-21 LAB — I-STAT TROPONIN, ED: TROPONIN I, POC: 0 ng/mL (ref 0.00–0.08)

## 2014-02-21 LAB — I-STAT CG4 LACTIC ACID, ED: Lactic Acid, Venous: 1.58 mmol/L (ref 0.5–2.2)

## 2014-02-21 MED ORDER — ONDANSETRON HCL 4 MG PO TABS: 4.0000 mg | ORAL_TABLET | Freq: Four times a day (QID) | ORAL | Status: DC | PRN

## 2014-02-21 MED ORDER — SODIUM CHLORIDE 0.9 % IV SOLN: 1000.0000 mL | INTRAVENOUS | Status: DC

## 2014-02-21 MED ORDER — SODIUM CHLORIDE 0.9 % IV SOLN
1000.0000 mL | Freq: Once | INTRAVENOUS | Status: AC
  Administered 2014-02-21: 1000 mL via INTRAVENOUS

## 2014-02-21 MED ORDER — ONDANSETRON HCL 4 MG/2ML IJ SOLN: 4.0000 mg | Freq: Once | INTRAMUSCULAR | Status: DC

## 2014-02-21 NOTE — Assessment & Plan Note (Signed)
Sudden onset of Hypoxemia w/ dizziness, n/v/ and near syncope ? Etiology  IV started with NS fluid bolus . Cardiac monitor w/ no ectopy noted.  B/P ok.  Pt to be transported to ER at [facility] via EMS, ER contacted w/ report to charge nurse.   Dr. Lenna Gilford  In attendance with pt exam and plan of care.

## 2014-02-21 NOTE — Progress Notes (Signed)
Subjective:    Patient ID: Gregory [name], male    DOB: 1941/06/05, 73 y.o.   MRN: [id number]  HPI 73 y/o WM   Hx OWR syndrome;  Hx atypCP;  Hyperchol;  Hypothyroidism;  GERD/ Divertics/ IBS/ Colon polyps;  BPH/ BOO;  DJD/ LBP/ Plantar fasciitis;  Hx dizziness;  Anxiety & chronic persistant insomnia...  ~  November 21, 2013:  6wk [name] & add-on appt after cardiac eval; he notes incr DOE w/ stairs over the last few months and saw Cards- DrCrenshaw10/14 (note reviewed, last eval 2009 w/ Echo & Myoview); exam ewas neg & they decided to repeat the studies;   EKG 10/14 showed SBrady, rate57, otherw wnl;  2DEcho 10/14 showed normal LV size & wall thickness, norm LVF w/ EF=55-60%, Gr1DD, mildly thickened AoV leaflets, mild MR;  ETT 11/14 showed 70min on treadmill, hypertensive BP response, rare PVC, no ischemia, O2 sats dropped to 87% w/ exercise...  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 Given this mild airflow obstruction we will start Rx w/ Dulera100- 2spBid and plan f/u in 2 mo... Rec to start a gradual exercise program...  02/21/2014 Acute OV  Complains of  dizziness, body aches, nausea, vomiting, diarrhea, increased SOB, dry cough onset yesterday.  denies f/c/s, hemoptysis. On arrival to office O2 sat 83% on RA. 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 Placed on NRB at 100% , marginal sats ~90% w/ intermittent desats.  Pt w/ near syncopal episode in office with diaphoresis, weakness and dizziness. IV started w/ NS bolus and EMS called for emergency transport to ER at [facility].  Cardiac monitor w/ HR ~80-90. No ectopy noted.  B/p ~130/60 .  No chest pain, edema , orthopnea, fever or hemoptysis.   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  Has been having DOE w/ desats since 11/2013 .  ~  12/14:  PFTs showed FVC=3.98 (89%), FEV1=2.72 (79%), %1sec=68, and mid-flows= 51% predicted; c/w mild obstructive dis >started on Dulera100-2spBid            Problem List:   OTHER DISEASES OF NASAL CAVITY AND SINUSES - Hx of Osler-Weber-Rendue syndrome/ hereditary telangiectasias... he's had numerous nose bleeds and surgery by Etter Sjogren, now followed by Barbaraann Faster... left nares is occluded w/ skin graft- no lumen... this is quite uncomfortable to the patient but he's been told there is nothing further that can be done for this... ~  3/11:  notes occas nosebleeds but he is able to handle them... ~  4/12:  No diff in his pattern of mild nose bleeds & he denies any severe epistaxis episodes... 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 ~  4/14:  He notes infreq bleeding now w/ saline etc... 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   Hx Cough >> see 10/13 eval & rx... 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 known GI telangiectasia as well without signif GI bleeding in the past- followed by [facility]... 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 extensive nasal problems as above... ~  labs 2/09 showed Hg= 17.0 ~  labs 2/10 showed Hg= 16.4 ~  CXR 3/11 is chr incr markings esp RLL- no change, NAD...  ~  Labs 6/11 showed Hg= 15.9 ~  Labs 4/12 showed Hg=  16.0 ~  CXR 10/12 showed mild biapical scarring, RLL AVM, NAD... ~  Labs 4/13 showed Hg= 15.2 ~  CXR 8/13 showed heart at upper lim of norm, increased markings & apical scarring, chr incr markings in RLL- AVM, NAD.[name]. 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 resolved w/ rest & Protonix... EKG showed WNL, prev [facility] was neg (no ischemia or infarction and EF= 61%)... Cardiac eval DrCrenshaw w/ 2DEcho 10/09 showing norm LV- no regional wall motion abn, norm LVF w/ EF= 60%, mild dil RA/ RV;  and norm MYOVIEW (no ischemia or infarction, EF= 58%)... ~  10/14: repeat cardiac eval by DrCrenshaw due to [place] w/ stairs; EKG 10/14 showed SBrady, rate57, otherw wnl;  2DEcho 10/14 showed normal LV size & wall thickness, norm LVF w/ EF=55-60%, Gr1DD, mildly thickened AoV leaflets, mild MR;  ETT 11/14 showed 90min on treadmill, hypertensive BP response, rare PVC, no ischemia, O2 sats dropped to 87% w/ exercise  HYPERCHOLESTEROLEMIA, MILD (ICD-272.0) - now on CRESTOR 5mg /d>  Prev Rx w/ Simva40 but developed muscle cramping which resolved off the med. ~  FLP 6/08 showed TChol 208, TG 75, HDL 34, LDL 139... he preferred diet Rx- ~  FLP 2/09 showed TChol 172, TG 69, HDL 30, LDL 129... he agreed to [place]... ~  [place] 8/09 on Crestor10 showed TChol 91, TG 49, HDL 28, LDL 54... rec- keep same. ~  FLP 2/10 on Crestor10 showed TChol 108, TG 56, HDL 31, LDL 66 ~  9/10:  we discussed changing Crestor10 to Simvastatin40 for $$$ reasons... ~  [place] 3/11 on Simva40 showed TChol 105, TG 59, HDL 39, LDL 54... continue same. ~  [place] 4/12 on Simva40 showed TChol 104, TG 62, HDL 29, LDL 63... C/o severe muscle cramps & wants to stop  for 72mo> cramping resolved! ~  [facility] & they restarted his Crestor at 5mg /d> tol well so far... ~  FLP 10/12 on Cres5 showed TChol 114, TG 40, HDL 36, LDL 70 ~  FLP 2/13 on Cres5 showed TChol 124, TG 21, HDL 40, LDL 80 ~  FLP 4/14 on Cres5 showed TChol 119, TG 44, HDL 38, LDL 72  ~  6/14: pt stopped Cres5 on his own due to leg cramps & decided to hold statins for now w/ repeat FLP off meds in several months... ~  FLP 10/14 on diet alone showed TChol 155, TG 29, HDL 38, LDL 111   HYPOTHYROIDISM - on SYNTHROID 181mcg/d now; prev 178mcg/d dose was  decreased 4/12... ~  labs 2/09 showed TSH= 0.69...  8/09 showed TSH= 0.44... ~  labs 2/10 showed TSH= 0.68 ~  labs 3/11 showed TSH= 0.52 ~  Labs 4/12 on Synthroid125 showed TSH= 0.33... We decided to decr the dose to 112/d. ~  Labs 4/13 on Levo112 showed TSH= 4.60 ~  Labs 4/14 on Levo112 showed TSH= 5.46  GERD, IRRITABLE BOWEL SYNDROME, COLONIC POLYPS - followed by [facility]... on Protonix40 (he tried OTC Zantac 75mg  on his own)... also takes  [place]...  ~  colonoscopy 1/04 w/ divertics, diminutive rectal polyp (adenomatous), hems...  ~  f/u colon 1/07 showed no recurrent polyps... ~  colonoscopy 3/12 at [facility]- neg x for hems & [facility] rec f/u 8yrs...  HYPERTROPHY PROSTATE W/UR OBST & OTH LUTS (ICD-600.01) - followed by [facility] on RAPAFLO 8mg /d (stopped Flomax due to side effect- ED)... doing well without LTOS, just complains of nocturia x 1-2 ~  labs 2/09 showed PSA= 0.76 ~  labs 2/10 showed PSA= 0.90 ~  2011 PSA checked by DrDavis (pt states it was OK)... ~  Labs 4/12 showed PSA= 1.22 ~  Labs 4/13 showed PSA= 1.31 ~  10/13: presents c/o incr freq, nocturia x2-4, & weak stream despite Rapaflo; he is intol to Flomax; refer to Urology for further eval=> seen by DrEskridge, no changes made...  LEFT ARM & SHOULDER PAIN >> he was checked by Lacie Scotts but not improving he says; we discussed second opinion at the  [facility], DrSypher==> notes reviewed.  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 ~  9/10:  requesting Rx for Robaxin for muscle spasm as needed.  PLANTAR FASCIITIS (ICD-728.71) - c/o pain in his feet secondary to plantar fasciitis eval & Rx from [facility] w/ Celebrex & shots... ~  9/10: we discussed change to [facility] - symptoms improved.  Hx NOCTURNAL LEG CRAMPS >>  ~  This initially occurred several yrs ago while on Simvastatin & the cramps resolved off this med; he was subseq placed on Cres5 & tol well... ~  6/14: he indicates that noct leg cramps returned x1wk & he stopped the Cres5 w/ improvement; he wants to leave off the statins for now; rec to try tonic water/ yellow mustard prn...  Hx of DIZZINESS (ICD-780.4) - sudden episode dizziness Sep09 working at [employer] around [employer] w/ room spinning- lasted 77min and resolved spontaneously, felt light headed afterwards... no LOC, syncope, seizure activity, etc... no CP/ palpit/ SOB/ etc... there were several EMTs in the restaurant and they checked him out- stable and transported to ER... exam was neg-  they did labs: all norm, didn't do scans (can't get MRI due to metallic clamp in sinus), he can't take ASA due to bleeding from his OWR.[name]. treated w/ MECLIZINE w/ improvement.  ANXIETY (ICD-300.00) & INSOMNIA, CHRONIC (ICD-307.42) - he uses CHLORAZEPATE 7.5mg  Prn & AMBIEN10mg Maudie Flakes for chronic persistant insomnia...  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  Health Maintenance: ~  GI:  followed [facility] & up to date on colon etc... (last 1/07 & 8yr f/u due 1/12). ~  GU:  followed by [facility] in past; PSAs have all been wnl... ~  Immunizations:  he getsyearly Flu vaccinations in the fall;  OK Pneumovax 03/12/10 at age[age];     Past Surgical History  Procedure Laterality Date  .  Varicocelectomy  1991    Dr. Tresa Endo  . 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 Other surgical history      pulse laser for facial telangiectasias inthe past  . Excision left wrist ganglian/ myxoid cyst  05-30-2009  . Cardiovascular stress test  01-14-2003    NO ISCHEMIA / EF 61%/ NORMAL LE WALL MOTION  . Transthoracic echocardiogram  10-17-2008   DR CRENSHAW    NORMAL LVF/ EF 60%/  MILDLY DILATED RIGHT ATRIUM/ VENTRICULE  . 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 Renda Rolls    Outpatient Encounter Prescriptions as of 02/21/2014  Medication Sig  . acetaminophen (TYLENOL) 500 MG tablet Take 500 mg by mouth as needed. Rotates with ibuprofen/naproxen. Does not take all three at one time.   . clidinium-chlordiazePOXIDE (LIBRAX) 2.5-5 MG per capsule TAKE ONE CAPSULE BY MOUTH 3 TIMES A DAY AS NEEDED FOR ABDOMINAL CRAMPING  . clorazepate (TRANXENE) 7.5 MG tablet Take 1 tablet (7.5 mg total) by mouth 3 (three) times daily as needed.  [name] dextromethorphan-guaiFENesin (MUCINEX DM) 30-600 MG per 12 hr tablet Take 1-2 tablets by mouth every 12 (twelve) hours as needed.  [name] levothyroxine (SYNTHROID, LEVOTHROID) 112 MCG tablet Take 1 tablet (112 mcg total) by mouth daily before breakfast.  . loratadine (CLARITIN) 10 MG tablet Take 1 tablet (10 mg total) by mouth daily. As needed for allergies   . Magnesium 250 MG TABS Take 1 tablet by mouth at bedtime.  . meclizine (ANTIVERT) 25 MG tablet Take 1/2 tablet to 1 whole tablet by mouth 4 times daily as directed for dizziness  . meloxicam (MOBIC) 7.5 MG tablet TAKE 1 TABLET BY MOUTH EVERY DAY AS NEEDED  . mometasone-formoterol (DULERA) 100-5 MCG/ACT AERO Inhale 2 puffs into the lungs 2 (two) times daily. Rinse after each use  . Multiple Vitamins-Minerals (CENTRUM) tablet Take 1 tablet by mouth daily.    . naproxen sodium (ANAPROX) 220 MG tablet Take 220 mg by mouth as needed. Rotates with  ibuprofen/acetaminophen. Does not take at same time as others.   . pantoprazole (PROTONIX) 40 MG tablet TAKE 1 TABLET BY MOUTH EVERY day  . silodosin (RAPAFLO) 8 MG CAPS capsule Take 1 capsule (8 mg total) by mouth daily with breakfast.  . zolpidem (AMBIEN) 10 MG tablet TAKE 1 TABLET BY MOUTH AT BEDTIME AS NEEDED  . Diphenhyd-Hydrocort-Nystatin (FIRST-DUKES MOUTHWASH) SUSP garlge and swallow 1 tsp four times daily as needed  . HYDROcodone-homatropine (HYCODAN) 5-1.5 MG/5ML syrup Take 5 mLs by mouth every 6 (six) hours as needed.    Allergies  Allergen Reactions  . Zocor [Simvastatin - High Dose]     Leg cramps     Current Medications, Allergies, Past Medical History, Past Surgical History, Family History, and Social History were reviewed in [employer] record.    Review of Systems       Constitutional:   No  weight loss, night sweats,  Fevers, chills, + fatigue, or  lassitude.  HEENT:   No headaches,  Difficulty swallowing,  Tooth/dental problems, or  Sore throat,                No sneezing, itching, ear ache,  +chronic nasal congestion  CV:  No chest pain,  Orthopnea, PND, swelling in lower extremities, anasarca, dizziness, palpitations,  +near syncope.   GI  No heartburn, indigestion, abdominal pain, nausea, vomiting, diarrhea,  change in bowel habits, loss of appetite, bloody stools.   Resp: +++ shortness of breath with exertion or at rest.  No excess mucus, no productive cough,  No non-productive cough,  No coughing up of blood.  No change in color of mucus.  No wheezing.  No chest wall deformity  Skin: no rash or lesions.  GU: no dysuria, change in color of urine, no urgency or frequency.  No flank pain, no hematuria   MS:  No joint pain or swelling.  No decreased range of motion.  No back pain.  Psych:  No change in mood or affect. No depression or anxiety.  No memory loss.        Objective:   Physical Exam      WD, WN, 73 y/o WM   GENERAL:  Alert & oriented; pale clammy and pale.  HEENT:  [place]/AT, EOM-wnl, PERRLA, EACs-clear, TMs-wnl, NOSE- occluded left nares from skin grafts, MOUTH- mucosal telangiectasias.  NECK:  Supple w/ fairROM; no JVD; normal carotid impulses w/o bruits; no thyromegaly or nodules palpated; no lymphadenopathy. CHEST:  Diminished BS in bases , without wheezes/ rales/ or rhonchi. HEART:  Regular Rhythm; without murmurs/ rubs/ or gallops. ABDOMEN:  Soft & nontender; normal bowel sounds; no organomegaly or masses detected. EXT: without deformities, mild arthritic changes; no varicose veins/ venous insuffic/ or edema. NEURO:  CN's intact; motor testing normal;  No focal deficits noted.  DERM:  mult telangiectasias...    Assessment & Plan:

## 2014-02-21 NOTE — Discharge Instructions (Signed)
Nausea and Vomiting °Nausea is a sick feeling that often comes before throwing up (vomiting). Vomiting is a reflex where stomach contents come out of your mouth. Vomiting can cause severe loss of body fluids (dehydration). Children and elderly adults can become dehydrated quickly, especially if they also have diarrhea. Nausea and vomiting are symptoms of a condition or disease. It is important to find the cause of your symptoms. °CAUSES  °· Direct irritation of the stomach lining. This irritation can result from increased acid production (gastroesophageal reflux disease), infection, food poisoning, taking certain medicines (such as nonsteroidal anti-inflammatory drugs), alcohol use, or tobacco use. °· Signals from the brain. These signals could be caused by a headache, heat exposure, an inner ear disturbance, increased pressure in the brain from injury, infection, a tumor, or a concussion, pain, emotional stimulus, or metabolic problems. °· An obstruction in the gastrointestinal tract (bowel obstruction). °· Illnesses such as diabetes, hepatitis, gallbladder problems, appendicitis, kidney problems, cancer, sepsis, atypical symptoms of a heart attack, or eating disorders. °· Medical treatments such as chemotherapy and radiation. °· Receiving medicine that makes you sleep (general anesthetic) during surgery. °DIAGNOSIS °Your caregiver may ask for tests to be done if the problems do not improve after a few days. Tests may also be done if symptoms are severe or if the reason for the nausea and vomiting is not clear. Tests may include: °· Urine tests. °· Blood tests. °· Stool tests. °· Cultures (to look for evidence of infection). °· X-rays or other imaging studies. °Test results can help your caregiver make decisions about treatment or the need for additional tests. °TREATMENT °You need to stay well hydrated. Drink frequently but in small amounts. You may wish to drink water, sports drinks, clear broth, or eat frozen  ice pops or gelatin dessert to help stay hydrated. When you eat, eating slowly may help prevent nausea. There are also some antinausea medicines that may help prevent nausea. °HOME CARE INSTRUCTIONS  °· Take all medicine as directed by your caregiver. °· If you do not have an appetite, do not force yourself to eat. However, you must continue to drink fluids. °· If you have an appetite, eat a normal diet unless your caregiver tells you differently. °· Eat a variety of complex carbohydrates (rice, wheat, potatoes, bread), lean meats, yogurt, fruits, and vegetables. °· Avoid high-fat foods because they are more difficult to digest. °· Drink enough water and fluids to keep your urine clear or pale yellow. °· If you are dehydrated, ask your caregiver for specific rehydration instructions. Signs of dehydration may include: °· Severe thirst. °· Dry lips and mouth. °· Dizziness. °· Dark urine. °· Decreasing urine frequency and amount. °· Confusion. °· Rapid breathing or pulse. °SEEK IMMEDIATE MEDICAL CARE IF:  °· You have blood or brown flecks (like coffee grounds) in your vomit. °· You have black or bloody stools. °· You have a severe headache or stiff neck. °· You are confused. °· You have severe abdominal pain. °· You have chest pain or trouble breathing. °· You do not urinate at least once every 8 hours. °· You develop cold or clammy skin. °· You continue to vomit for longer than 24 to 48 hours. °· You have a fever. °MAKE SURE YOU:  °· Understand these instructions. °· Will watch your condition. °· Will get help right away if you are not doing well or get worse. °Document Released: 12/06/2005 Document Revised: 02/28/2012 Document Reviewed: 05/05/2011 °ExitCare® Patient Information ©2014 [employer]. ° °Diarrhea °Diarrhea is frequent   loose and watery bowel movements. It can cause you to feel weak and dehydrated. Dehydration can cause you to become tired and thirsty, have a dry mouth, and have decreased urination that  often is dark yellow. Diarrhea is a sign of another problem, most often an infection that will not last long. In most cases, diarrhea typically lasts 2 3 days. However, it can last longer if it is a sign of something more serious. It is important to treat your diarrhea as directed by your caregive to lessen or prevent future episodes of diarrhea. CAUSES  Some common causes include:  Gastrointestinal infections caused by viruses, bacteria, or parasites.  Food poisoning or food allergies.  Certain medicines, such as antibiotics, chemotherapy, and laxatives.  Artificial sweeteners and fructose.  Digestive disorders. HOME CARE INSTRUCTIONS  Ensure adequate fluid intake (hydration): have 1 cup (8 oz) of fluid for each diarrhea episode. Avoid fluids that contain simple sugars or sports drinks, fruit juices, whole milk products, and sodas. Your urine should be clear or pale yellow if you are drinking enough fluids. Hydrate with an oral rehydration solution that you can purchase at pharmacies, retail stores, and online. You can prepare an oral rehydration solution at home by mixing the following ingredients together:    tsp table salt.   tsp baking soda.   tsp salt substitute containing potassium chloride.  1  tablespoons sugar.  1 L (34 oz) of water.  Certain foods and beverages may increase the speed at which food moves through the gastrointestinal (GI) tract. These foods and beverages should be avoided and include:  Caffeinated and alcoholic beverages.  High-fiber foods, such as raw fruits and vegetables, nuts, seeds, and whole grain breads and cereals.  Foods and beverages sweetened with sugar alcohols, such as xylitol, sorbitol, and mannitol.  Some foods may be well tolerated and may help thicken stool including:  Starchy foods, such as rice, toast, pasta, low-sugar cereal, oatmeal, grits, baked potatoes, crackers, and bagels.  Bananas.  Applesauce.  Add probiotic-rich foods  to help increase healthy bacteria in the GI tract, such as yogurt and fermented milk products.  Wash your hands well after each diarrhea episode.  Only take over-the-counter or prescription medicines as directed by your caregiver.  Take a warm bath to relieve any burning or pain from frequent diarrhea episodes. SEEK IMMEDIATE MEDICAL CARE IF:   You are unable to keep fluids down.  You have persistent vomiting.  You have blood in your stool, or your stools are black and tarry.  You do not urinate in 6 8 hours, or there is only a small amount of very dark urine.  You have abdominal pain that increases or localizes.  You have weakness, dizziness, confusion, or lightheadedness.  You have a severe headache.  Your diarrhea gets worse or does not get better.  You have a fever or persistent symptoms for more than 2 3 days.  You have a fever and your symptoms suddenly get worse. MAKE SURE YOU:   Understand these instructions.  Will watch your condition.  Will get help right away if you are not doing well or get worse. Document Released: 11/26/2002 Document Revised: 11/22/2012 Document Reviewed: 08/13/2012 [facility] Patient Information 2014 [place], [place].  Ondansetron tablets What is this medicine? ONDANSETRON (on DAN se tron) is used to treat nausea and vomiting caused by chemotherapy. It is also used to prevent or treat nausea and vomiting after surgery. This medicine may be used for other purposes; ask your health  care provider or pharmacist if you have questions. COMMON BRAND NAME(S): Zofran What should I tell my health care provider before I take this medicine? They need to know if you have any of these conditions: -heart disease -history of irregular heartbeat -liver disease -low levels of magnesium or potassium in the blood -an unusual or allergic reaction to ondansetron, granisetron, other medicines, foods, dyes, or preservatives -pregnant or trying to get  pregnant -breast-feeding How should I use this medicine? Take this medicine by mouth with a glass of water. Follow the directions on your prescription label. Take your doses at regular intervals. Do not take your medicine more often than directed. Talk to your pediatrician regarding the use of this medicine in children. Special care may be needed. Overdosage: If you think you have taken too much of this medicine contact a poison control center or emergency room at once. NOTE: This medicine is only for you. Do not share this medicine with others. What if I miss a dose? If you miss a dose, take it as soon as you can. If it is almost time for your next dose, take only that dose. Do not take double or extra doses. What may interact with this medicine? Do not take this medicine with any of the following medications: -apomorphine -certain medicines for fungal infections like fluconazole, itraconazole, ketoconazole, posaconazole, voriconazole -cisapride -dofetilide -dronedarone -pimozide -thioridazine -ziprasidone  This medicine may also interact with the following medications: -carbamazepine -certain medicines for depression, anxiety, or psychotic disturbances -fentanyl -linezolid -MAOIs like Carbex, Eldepryl, Marplan, Nardil, and Parnate -methylene blue (injected into a vein) -other medicines that prolong the QT interval (cause an abnormal heart rhythm) -phenytoin -rifampicin -tramadol This list may not describe all possible interactions. Give your health care provider a list of all the medicines, herbs, non-prescription drugs, or dietary supplements you use. Also tell them if you smoke, drink alcohol, or use illegal drugs. Some items may interact with your medicine. What should I watch for while using this medicine? Check with your doctor or health care professional right away if you have any sign of an allergic reaction. What side effects may I notice from receiving this medicine? Side  effects that you should report to your doctor or health care professional as soon as possible: -allergic reactions like skin rash, itching or hives, swelling of the face, lips or tongue -breathing problems -confusion -dizziness -fast or irregular heartbeat -feeling faint or lightheaded, falls -fever and chills -loss of balance or coordination -seizures -sweating -swelling of the hands or feet -tightness in the chest -tremors -unusually weak or tired Side effects that usually do not require medical attention (report to your doctor or health care professional if they continue or are bothersome): -constipation or diarrhea -headache This list may not describe all possible side effects. Call your doctor for medical advice about side effects. You may report side effects to FDA at 1-800-FDA-1088. Where should I keep my medicine? Keep out of the reach of children. Store between 2 and 30 degrees C (36 and 86 degrees F). Throw away any unused medicine after the expiration date. NOTE: This sheet is a summary. It may not cover all possible information. If you have questions about this medicine, talk to your doctor, pharmacist, or health care provider.  2014, [employer]vier/[employer] Standard. (2013-09-12 16:27:45)

## 2014-02-21 NOTE — ED Notes (Signed)
Pt arrived via ems from pmd. Pt s/o n/v/d since 5 am. Pt had near syncopal episode at [facility]md with drop in spo2 pmd reports 80%.Pt arrived on NRB 10lL pt unable to tolerate [place] due to previous sx. EMs states spo2 dropped to 90% without o2. Pt alert x4 respirations easy non labored. Skin w/d spo2 93 RA

## 2014-02-21 NOTE — ED Provider Notes (Signed)
CSN: [id number]     Arrival date & time 02/21/14  1555 History   First MD Initiated Contact with Patient 02/21/14 1600     Chief Complaint  Patient presents with  . Near Syncope     (Consider location/radiation/quality/duration/timing/severity/associated sxs/prior Treatment) The history is provided by the patient.   73 year old male had onset this morning of vomiting and diarrhea. He vomited 3 times and fairly quick succession, and had one episode of diarrhea. Since then he has felt weak and as noted he felt dizzy if he stood up. He will went to his doctor's office where he nearly passed out. He was told that his oxygen levels were low. He has mild residual nausea but does not feel like he is going to have more diarrhea. Denies fever, chills, sweats. Denies chest pain, heaviness, tightness, pressure. He denies sick contacts.  Past Medical History  Diagnosis Date  . Other diseases of nasal cavity and sinuses(478.19)   . Pure hypercholesterolemia   . Unspecified hypothyroidism   . Irritable bowel syndrome   . Benign neoplasm of colon   . Hypertrophy of prostate with urinary obstruction and other lower urinary tract symptoms (LUTS)   . Lumbago   . Plantar fascial fibromatosis   . Anxiety state, unspecified   . Persistent disorder of initiating or maintaining sleep   . GERD (gastroesophageal reflux disease)   . Arthritis   . 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 History of nonmelanoma skin cancer EXCISION SQUAMOUS CELL FROM HAND  . 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 Varicocelectomy  1991    Dr. Tresa Endo  . 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 Other surgical history      pulse laser for facial telangiectasias inthe  past  . Excision left wrist ganglian/ myxoid cyst  05-30-2009  . Cardiovascular stress test  01-14-2003    NO ISCHEMIA / EF 61%/ NORMAL LE WALL MOTION  . Transthoracic echocardiogram  10-17-2008   DR CRENSHAW    NORMAL LVF/ EF 60%/  MILDLY DILATED RIGHT ATRIUM/ VENTRICULE  . 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 Renda Rolls   Family History  Problem Relation Age of Onset  . Diabetes Mother   . Kidney failure Father   . Hypertension Brother    History  Substance Use Topics  . Smoking status: Never Smoker   . Smokeless tobacco: Never Used  . Alcohol Use: No    Review of Systems  All other systems reviewed and are negative.      Allergies  Zocor  Home Medications   Current Outpatient Rx  Name  Route  Sig  Dispense  Refill  . acetaminophen (TYLENOL) 500 MG tablet   Oral   Take 500 mg by mouth as needed. Rotates with ibuprofen/naproxen. Does not take all three at one time.          . clidinium-chlordiazePOXIDE (LIBRAX) 2.5-5 MG per capsule      TAKE ONE CAPSULE BY MOUTH 3 TIMES A DAY AS NEEDED FOR ABDOMINAL CRAMPING   90 capsule   6   . clorazepate (TRANXENE) 7.5 MG tablet   Oral   Take 1 tablet (7.5 mg total)  by mouth 3 (three) times daily as needed.   90 tablet   5   . dextromethorphan-guaiFENesin (MUCINEX DM) 30-600 MG per 12 hr tablet   Oral   Take 1-2 tablets by mouth every 12 (twelve) hours as needed.         . Diphenhyd-Hydrocort-Nystatin (FIRST-DUKES MOUTHWASH) SUSP      garlge and swallow 1 tsp four times daily as needed   120 mL   5   . HYDROcodone-homatropine (HYCODAN) 5-1.5 MG/5ML syrup   Oral   Take 5 mLs by mouth every 6 (six) hours as needed.         [name] levothyroxine (SYNTHROID, LEVOTHROID) 112 MCG tablet   Oral   Take 1 tablet (112 mcg total) by mouth daily before breakfast.   30 tablet   11   . loratadine (CLARITIN) 10 MG tablet   Oral   Take 1 tablet (10 mg total) by mouth daily. As needed for allergies    30  tablet   2   . Magnesium 250 MG TABS   Oral   Take 1 tablet by mouth at bedtime.         . meclizine (ANTIVERT) 25 MG tablet      Take 1/2 tablet to 1 whole tablet by mouth 4 times daily as directed for dizziness   90 tablet   11   . meloxicam (MOBIC) 7.5 MG tablet      TAKE 1 TABLET BY MOUTH EVERY DAY AS NEEDED   30 tablet   6   . mometasone-formoterol (DULERA) 100-5 MCG/ACT AERO   Inhalation   Inhale 2 puffs into the lungs 2 (two) times daily. Rinse after each use         . Multiple Vitamins-Minerals (CENTRUM) tablet   Oral   Take 1 tablet by mouth daily.           . naproxen sodium (ANAPROX) 220 MG tablet   Oral   Take 220 mg by mouth as needed. Rotates with ibuprofen/acetaminophen. Does not take at same time as others.          . pantoprazole (PROTONIX) 40 MG tablet      TAKE 1 TABLET BY MOUTH EVERY day   30 tablet   6     PATIENT SAYS DIRECTIONS SHOULD BE QD NOT QOD ...PL ...   . silodosin (RAPAFLO) 8 MG CAPS capsule   Oral   Take 1 capsule (8 mg total) by mouth daily with breakfast.   30 capsule   11   . zolpidem (AMBIEN) 10 MG tablet      TAKE 1 TABLET BY MOUTH AT BEDTIME AS NEEDED   30 tablet   5    BP 118/56  Pulse 84  Temp(Src) 98 F (36.7 C) (Oral)  Resp 18  Ht 5\' 10"  (1.778 m)  Wt 182 lb (82.555 kg)  BMI 26.11 kg/m2  SpO2 92% Physical Exam  Nursing note and vitals reviewed.  73 year old male, resting comfortably and in no acute distress. Vital signs are normal. Oxygen saturation is 92%, which is normal. Head is normocephalic and atraumatic. PERRLA, EOMI. Oropharynx is clear. Neck is nontender and supple without adenopathy or JVD. Back is nontender and there is no CVA tenderness. Lungs are clear without rales, wheezes, or rhonchi. Chest is nontender. Heart has regular rate and rhythm without murmur. Abdomen is soft, flat, nontender without masses or hepatosplenomegaly and peristalsis is normoactive. Extremities have no  cyanosis or edema,  full range of motion is present. Skin is warm and dry without rash. Neurologic: Mental status is normal, cranial nerves are intact, there are no motor or sensory deficits.  ED Course  Procedures (including critical care time) Labs Review Results for orders placed during the hospital encounter of 02/21/14  CBC WITH DIFFERENTIAL      Result Value Ref Range   WBC 12.5 (*) 4.0 - 10.5 K/uL   RBC 5.23  4.22 - 5.81 MIL/uL   Hemoglobin 13.6  13.0 - 17.0 g/dL   HCT 40.6  39.0 - 52.0 %   MCV 77.6 (*) 78.0 - 100.0 fL   MCH 26.0  26.0 - 34.0 pg   MCHC 33.5  30.0 - 36.0 g/dL   RDW 16.2 (*) 11.5 - 15.5 %   Platelets 268  150 - 400 K/uL   Neutrophils Relative % 91 (*) 43 - 77 %   Neutro Abs 11.4 (*) 1.7 - 7.7 K/uL   Lymphocytes Relative 4 (*) 12 - 46 %   Lymphs Abs 0.5 (*) 0.7 - 4.0 K/uL   Monocytes Relative 5  3 - 12 %   Monocytes Absolute 0.6  0.1 - 1.0 K/uL   Eosinophils Relative 0  0 - 5 %   Eosinophils Absolute 0.0  0.0 - 0.7 K/uL   Basophils Relative 0  0 - 1 %   Basophils Absolute 0.0  0.0 - 0.1 K/uL  BASIC METABOLIC PANEL      Result Value Ref Range   Sodium 135 (*) 137 - 147 mEq/L   Potassium 4.8  3.7 - 5.3 mEq/L   Chloride 99  96 - 112 mEq/L   CO2 23  19 - 32 mEq/L   Glucose, Bld 120 (*) 70 - 99 mg/dL   BUN 26 (*) 6 - 23 mg/dL   Creatinine, Ser 1.23  0.50 - 1.35 mg/dL   Calcium 9.0  8.4 - 10.5 mg/dL   GFR calc non Af Amer 57 (*) >90 mL/min   GFR calc Af Amer 66 (*) >90 mL/min  CBG MONITORING, ED      Result Value Ref Range   Glucose-Capillary 117 (*) 70 - 99 mg/dL  I-STAT TROPOININ, ED      Result Value Ref Range   Troponin i, poc 0.00  0.00 - 0.08 ng/mL   Comment 3           I-STAT CG4 LACTIC ACID, ED      Result Value Ref Range   Lactic Acid, Venous 1.58  0.5 - 2.2 mmol/L   Imaging Review: Dg Chest Port 1 View  02/21/2014   CLINICAL DATA:  Nausea and shortness of breath.  EXAM: PORTABLE CHEST - 1 VIEW  COMPARISON:  Two-view chest 10/04/2013.   FINDINGS: The heart size is normal. The lungs are clear. Lung volumes are low. The visualized soft tissues and bony thorax are unremarkable.  IMPRESSION: 1. Low lung volumes. 2. No acute cardiopulmonary disease.   Electronically Signed   By: Lawrence Santiago M.D.   On: 02/21/2014 17:30      EKG Interpretation   Date/Time:  Thursday February 21 2014 16:06:48 EST Ventricular Rate:  87 PR Interval:  151 QRS Duration: 98 QT Interval:  394 QTC Calculation: 474 R Axis:   74 Text Interpretation:  Sinus rhythm Low voltage, extremity and precordial  leads When compared with ECG of 09/16/2008, HEART RATE has increased  Confirmed by Roxanne Mins  MD, Trestin Vences ([id number]) on 02/21/2014 4:35:04 PM  MDM   Final diagnoses:  Vomiting and diarrhea  Near syncope    Vomiting and diarrhea which have mostly resolved. Dizziness appears secondary to dehydration. I reviewed his office records and there is a note stating he was hypoxic but no recorded value for his oxygen saturation. Oxygen saturation is adequate here. He'll be given IV hydration and orthostatic vital signs will be obtained.  He feels much better after above noted treatment. Orthostatic vital signs show no abnormal changes. He was ambulated with continuous pulse oximetry being obtained and showed no desaturation. He is discharged with prescription for ondansetron. I suspect that his low pulse oximetry reading in the doctor's office may been related to dehydration and poor peripheral blood flow.  Delora Fuel, MD [id number] [id number]

## 2014-02-21 NOTE — Patient Instructions (Signed)
Transport to ER vis EMS

## 2014-02-21 NOTE — ED Notes (Signed)
EkG complete and given to Dr Roxanne Mins

## 2014-02-22 ENCOUNTER — Other Ambulatory Visit: Payer: Self-pay

## 2014-02-27 ENCOUNTER — Other Ambulatory Visit: Payer: Self-pay | Admitting: Pulmonary Disease

## 2014-03-04 ENCOUNTER — Telehealth: Payer: Self-pay | Admitting: Pulmonary Disease

## 2014-03-04 MED ORDER — MECLIZINE HCL 25 MG PO TABS: 25.0000 mg | ORAL_TABLET | Freq: Every day | ORAL | Status: DC | PRN

## 2014-03-04 NOTE — Telephone Encounter (Signed)
Rx has been sent in. Pt is aware. 

## 2014-03-07 ENCOUNTER — Telehealth: Payer: Self-pay | Admitting: Pulmonary Disease

## 2014-03-07 NOTE — Telephone Encounter (Signed)
lmomtcb x1 for pt No PCP has been set up for pt.

## 2014-03-07 NOTE — Telephone Encounter (Signed)
Marliss Czar had called Gregory [name] to discuss SN retiring from primary care as of March 20 2014. Called spoke with patient and informed him of the above.  Gregory [name] was already aware of this and is scheduled to see SN 4.22.15 @ 2pm -- this appt had already been changed to pulmonary previously.  Gregory [name] stated that he does not want to be referred to a new PCP at this time and would like to discuss with SN at the upcoming appt.    Nothing further needed at this time; will sign off.

## 2014-04-10 ENCOUNTER — Encounter: Payer: Self-pay | Admitting: Pulmonary Disease

## 2014-04-10 ENCOUNTER — Ambulatory Visit: Payer: Medicare Other | Admitting: Pulmonary Disease

## 2014-04-10 ENCOUNTER — Ambulatory Visit (INDEPENDENT_AMBULATORY_CARE_PROVIDER_SITE_OTHER): Payer: Medicare Other | Admitting: Pulmonary Disease

## 2014-04-10 VITALS — BP 122/70 | HR 67 | Temp 97.6°F | Ht 70.0 in | Wt 187.6 lb

## 2014-04-10 DIAGNOSIS — K219 Gastro-esophageal reflux disease without esophagitis: Secondary | ICD-10-CM

## 2014-04-10 DIAGNOSIS — J4530 Mild persistent asthma, uncomplicated: Secondary | ICD-10-CM

## 2014-04-10 DIAGNOSIS — N138 Other obstructive and reflux uropathy: Secondary | ICD-10-CM

## 2014-04-10 DIAGNOSIS — J45909 Unspecified asthma, uncomplicated: Secondary | ICD-10-CM

## 2014-04-10 DIAGNOSIS — G4700 Insomnia, unspecified: Secondary | ICD-10-CM

## 2014-04-10 DIAGNOSIS — R0609 Other forms of dyspnea: Secondary | ICD-10-CM

## 2014-04-10 DIAGNOSIS — E039 Hypothyroidism, unspecified: Secondary | ICD-10-CM

## 2014-04-10 DIAGNOSIS — D126 Benign neoplasm of colon, unspecified: Secondary | ICD-10-CM

## 2014-04-10 DIAGNOSIS — I780 Hereditary hemorrhagic telangiectasia: Secondary | ICD-10-CM

## 2014-04-10 DIAGNOSIS — E7800 Pure hypercholesterolemia, unspecified: Secondary | ICD-10-CM

## 2014-04-10 DIAGNOSIS — K589 Irritable bowel syndrome without diarrhea: Secondary | ICD-10-CM

## 2014-04-10 DIAGNOSIS — N401 Enlarged prostate with lower urinary tract symptoms: Secondary | ICD-10-CM

## 2014-04-10 DIAGNOSIS — N139 Obstructive and reflux uropathy, unspecified: Secondary | ICD-10-CM

## 2014-04-10 DIAGNOSIS — R0989 Other specified symptoms and signs involving the circulatory and respiratory systems: Secondary | ICD-10-CM

## 2014-04-10 DIAGNOSIS — F411 Generalized anxiety disorder: Secondary | ICD-10-CM

## 2014-04-10 DIAGNOSIS — J3489 Other specified disorders of nose and nasal sinuses: Secondary | ICD-10-CM

## 2014-04-10 MED ORDER — ZOLPIDEM TARTRATE 10 MG PO TABS: 10.0000 mg | ORAL_TABLET | Freq: Every evening | ORAL | Status: DC | PRN

## 2014-04-10 MED ORDER — LEVOTHYROXINE SODIUM 112 MCG PO TABS: 112.0000 ug | ORAL_TABLET | Freq: Every day | ORAL | Status: DC

## 2014-04-10 MED ORDER — SILODOSIN 8 MG PO CAPS: 8.0000 mg | ORAL_CAPSULE | Freq: Every day | ORAL | Status: DC

## 2014-04-10 NOTE — Progress Notes (Addendum)
Subjective:    Patient ID: Gregory [name], male    DOB: 1941-05-30, 73 y.o.   MRN: [id number]  HPI 73 y/o WM here for a follow up visit... he has multiple medical problems including:  Hx OWR syndrome;  Hx atypCP;  Hyperchol;  Hypothyroidism;  GERD/ Divertics/ IBS/ Colon polyps;  BPH/ BOO;  DJD/ LBP/ Plantar fasciitis;  Hx dizziness;  Anxiety & chronic persistant insomnia...  ~  March 27, 2012:  25moROV & BMikki Santeeis stable- no new complaints or concerns... He continues to f/u w/ the [facility]id Clinic & is taking Crestor 571md (see labs below)... 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    Hx CP>  Prev cardiac w/u by DrCrenshaw was neg, no recurrent CP complaints, he remains active, etc...    Chol>  On Crestor5 now; he stopped Simva40 due to muscle cramps which improved/ resolved off the med; went to Lipid clinic & they tried Cres5- tol well so far!  FLP last= 2/13 & showed TChol 124, TG 21, HDL 40, LDL 80    Hypothy>  On Synthroid 11258md now>  Labs 4/12 revealed TSH= 0.33 on Synthroid125 so we decr the dose slightly to 112..  [name] GI>  [facility] & DrM rec f/u 81yr63yr    GU>  Hx BPH w/ BOO & followed by DrRDFEOFHQRFRapaflo 8mg/1m PSA done 4/12 = 1.22...    Ortho>  eval 12/11 by DrNorris for bilat leg pain & cramping, ?min atrophy of left gastroc- he rec tonic water & neurology eval> he saw DrWillis 3/12 (note reviewed)> NCV was normal; EMG c/w mild S1 radiculopathy; he rec MRI- pending (we did not get f/u note);  Now c/o some right arm/ shoulder symptoms & I  have rec eval by DrSypher to sort this out...    Anxiety/ Insomnia>  He has Chlorazepate for Prn use, and Ambien to use for sleep Qhs... We reviewed prob list, meds, xrays and labs> see below for updates >>  LABS 4/13:  Chems- wnl;  CBC- wnl;  TSH=4.60;  PSA=1.31;  UA- clear  ~  September 27, 2012:  89mo R39mo Gregory's CC= chronic cough, mostly dry, some congestion, little phlegm/ no blood/ etc; he saw TP 8/13 & given Hydromet/ Delsym/ Mobic/ Lozenges & these helped but prob is persistent;  Chest is clear, labs 4/13 normal, CXR 8/13 stable;  We reviewed options and decided to try Advair-HFA 115/21- 2puffsBid plus cough drops etc...     His list of questions today> 1) chronic nasal prob w/ continued freq bleeding & followed by DrRoseBarbaraann Fasterinue saline Rx;  2) he reports mother passed away at age 40 rec33tly after being Dx w/ pancreatic ca;  3) he's had [facility] & he is pending Moh's next week;  4) he has noted incr urine freq, weak stream, & nocturia x2-4 despite his Rapaflo med- we will set up referral to Urology for further eval...    We  reviewed prob list, meds, xrays and labs> see below for updates >> OK Flu shot today...  ~  March 28, 2013:  9moROV & BMikki Santeehas had several interval complaints> skin cancer removed from his forehead, cortisone shot in his knee, c/o dry throat & intermitt hoarse (Rx w/ lozenges, throat drops), he's been traveling w/ his job & doing ok w/o CP SOB etc...     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    Hx Cough> resolved w/ Gregory [name]& just using prn now; + Hycodan, Claritin, Mucinex, MMW- c/o dry throat & encouraged to use lozenges; he had URI treated by TP 1/14 w/esolved...    Hx CP>  Prev cardiac w/u by DrCrenshaw was neg, no recurrent CP complaints, he remains active,  etc...    Chol>  On Crestor5 now; he stopped Simva40 due to muscle cramps which resolved off this med; Gregory [name]/14 showed TChol 119, TG 44, HDL 38, LDL 72    Hypothy>  On Synthroid 1132m/d now>  Labs 4/12 revealed TSH= 0.33 on Synthroid125 so we decr the dose slightly to 112; f/u TSH= 4.60 & 5.46    GI- GERD, IBS, Polyps> on Protonix40Qod & Librax prn; [name] had colonoscopy 3/12- neg x for hems & DrM rec f/u 5y61yr.    GU>  Hx BPH w/ BOO & prev followed by DrRWIOMBTDH Rapaflo 8mg24m  He saw DrEskridge 12/13 who notes a slow PSAV c/wBPH & DRE was wnl...    Ortho>  On Mobic7.5 + OTC meds; eval 12/11 by DrNorris for bilat leg pain & cramping, ?min atrophy of left gastroc- he rec tonic water & neurology eval> he saw DrWillis 3/12 (note reviewed)> NCV was normal; EMG c/w mild S1 radiculopathy; he rec MRI- pt decided against the MRI (we did not get f/u note);  Now c/o some right arm/ shoulder symptoms & I have rec eval by DrSypher to sort this out...    Anxiety/ Insomnia>  He has Chlorazepate7.5 for Prn use, and Ambien10 to use for sleep Qhs... We reviewed prob list, meds, xrays and labs> see below for updates >>   LABS 4/14:  FLP- at goals on Cres5;  Chems- wnl;  CBC- wnl;  TSH=5.46 on Synthroid112;  VitD=50;  PSA=2.56     ~  June 11, 2013:  2-16mo 16mo& add-on appt for leg cramps> these occur mostly at night & wake him up; he stopped his Cres5 on his own & he swears that the cramps are better off this med; he recalls that yrs ago he had the same problem from SimvaBoleyging to Cres5 resolved the issue until recently;  We discussed the options and decided to leave off the statins for now, try to control Chol w/ diet & exercise, f/u FLP soon...  We reviewed the [employer] suggestions for Tonic Water, yellow mustard, bar of soap in the bed...     We reviewed prob list, meds, xrays and labs> see below for updates >>   ~  October 04, 2013:  16mo R16mo Gregory reMikki Santeetly saw DrCrenshaw c/o SOB (eg-  climbing stairs) & dizzy (no assoc symptoms)> known OWR, no change on exam, EKG was normal; they plan f/u 2DEcho & GXT... 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    Hx Cough> resolved w/ [id number] & off now; + Hycodan, Claritin, Mucinex, MMW- c/o dry throat & encouraged to use lozenges...    Hx CP>  Prev cardiac w/u by DrCrenshaw was neg, no recurrent CP complaints, he remains active but notes DOE w/ stairs, ok on level ground, DrCrenshaw plans f/u 2DEcho & GXT soon...    Chol> off Crestor now due to musc cramps; he stopped Simva40 due to muscle cramps which resolved off this med; FLP 10/14 on diet alone showed TChol 155, TG 29, HDL 38, LDL 111    Hypothy>  On Synthroid 1370mg/d now>  Labs 4/12 revealed TSH= 0.33 on Synthroid125 so we decr the dose slightly to 112;  f/u TSH= 4.60 & 5.46    GI- GERD, IBS, Polyps> on Protonix40Qd & Librax prn; [facility]- neg x for hems & DrM rec f/u 541yr..    GU>  Hx BPH w/ BOO & prev followed by DrRAXENMMHn Rapaflo 70m62m;  He saw DrEskridge 12/13 who notes a slow PSAV c/w BPH & DRE was wnl...    Ortho>  On Mobic7.5 + OTC meds; eval 12/11 by DrNorris for bilat leg pain & cramping, ?min atrophy of left gastroc- he rec tonic water & neurology eval> he saw DrWillis 3/12 (note reviewed)> NCV was normal; EMG c/w mild S1 radiculopathy; he rec MRI- pt decided against the MRI (we did not get f/u note)...    Anxiety/ Insomnia>  He has Chlorazepate7.5 for Prn use, and Ambien10 to use for sleep Qhs... 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  LABS 10/14:  FLP- on diet alone looks pretty good  w/ LDL=111...  ~  November 21, 2013:  6wk [name] & add-on appt after cardiac eval; he notes incr DOE w/ stairs over the last few months and saw Cards- DrCrenshaw10/14 (note reviewed, last eval 2009 w/ Echo & Myoview); exam was neg & they decided to repeat the studies;   EKG 10/14 showed SBrady, rate57, otherw wnl;  2DEcho 10/14 showed normal LV size & wall thickness, norm LVF w/ EF=55-60%, Gr1DD, mildly thickened AoV leaflets, mild MR;  ETT 11/14 showed 70mi79mn treadmill, hypertensive BP response, rare PVC, no ischemia, O2 sats dropped to 87% w/ exercise...  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  Marland KitchenMarland KitchenT today showed FVC=3.98 (89%), FEV1=2.72 (79%), %1sec=68, and mid-flows= 51% predicted... Given this mild airflow obstruction we will start Rx w/ Dulera100- 2spBid and plan f/u in 2 mo... Rec to start a gradual exercise program...  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  We decided to continue the same Rx & plan for now...  ~  April 10, 2014:  26moROV & post ER check> pt was seen by TP w/ gastroent, n/v, weakness, postural, & hypoxemic=> sent to ER for IVF & improved, sent home w/ Zofran  and symptoms resolved... He is currently feeling well & continues to improve w/ his Dulera and exercise program- sl SOB at top of stairs but improving; exercising on treadmill regularly, still working w/ [employer] but planning on retirement by end of the yr... 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    Hx CP>  Prev cardiac w/u by DHilary Hertzwas neg, no recurrent CP complaints, he remains active but notes DOE w/ stairs, ok on level ground, 2DEcho & GXT were OK x O2 sat drop to 87% at peak exercise- ?incr shunting thru his RLL AVM...    Chol> off Crestor now due to musc cramps; he prev stopped Simva40 due to muscle cramps as well; FLP 10/14 on diet alone showed TChol 155, TG 29, HDL 38, LDL 111...    Hypothy>  On Synthroid 1156m/d now>  Labs 4/12 revealed TSH= 0.33 on Synthroid125 so we decr the dose slightly to 112;  f/u TSH= 4.60 & 5.46...    GI- GERD, IBS, Polyps> on Prilosec40Qd & Zofran/ Librax prn; [facility]- neg x for hems & DrM rec f/u 5y80yr.    GU>  Hx BPH w/ BOO on Rapaflo 8mg48mper DrEskridge; last seen 1/15 & doing satis w/ PSA= 2.34    Ortho>  On Mobic7.5 prn + OTC meds; eval 12/11 by DrNorris for bilat leg pain & cramping, ?min atrophy of left gastroc- he rec tonic water & neurology eval> he saw DrWillis 3/12 (note reviewed)> NCV was normal; EMG c/w mild S1 radiculopathy; he rec MRI- pt decided against the MRI (we did not get f/u note)...    Anxiety/ Insomnia>  He has Chlorazepate7.5 for Prn use, and Ambien10 to  use for sleep Qhs... We reviewed prob list, meds, xrays and labs> see below for updates >>   CXR 3/15 showed normal heart size, clear lungs, mild apical scarring, right basilar density c/w AVM based on CT report 2004, NAD...  Marland KitchenMarland KitchenG 3/15 showed NSR, rate87, low volt, NAD...  ADDENDUM> LABS 4/15 showed>  FLP- TChol 137, TG 30, HDL 36, LDL 95;  Chems- wnl;  CBC- anemic w/ Hg=11.3 & MCV=77;  TSH=9.26... REC>  Ret for Iron panel (Fe=16- 4%sat), Ferritin (6.4), Stool cards (pending); then start FeSO4-325mg70mw/ VitC500... Recheck CBC, Iron level in 6-8weeks...             Taking Synthroid112 regularly, therefore increase back to 125mcg64m.Marland KitchenMarland Kitchen  Recheck TSH in 52mo..           Problem List:   OTHER DISEASES OF NASAL CAVITY AND SINUSES - Hx of Osler-Weber-Rendue syndrome/ hereditary telangiectasias... he's had numerous nose bleeds and surgery by DEtter Sjogren now followed by DBarbaraann Faster.. left nares is occluded w/ skin graft- no lumen... this is quite uncomfortable to the patient but he's been told there is nothing further that can be done for this... ~  3/11:  notes occas nosebleeds but he is able to handle them... ~  4/12:  No diff in his pattern of mild nose bleeds & he denies any severe epistaxis episodes... 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 ~  4/14:  He notes infreq bleeding now w/ saline etc... 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 Dyspnea w/ O2 desat w/ exercise >>  ~  12/14:  PFTs showed FVC=3.98 (89%), FEV1=2.72 (79%), %1sec=68, and mid-flows= 51% predicted; c/w mild obstructive dis & we decided on a trial of Dulera100-2spBid ~   4/15:  His breathing is improved w/ the [facility]...  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 known GI telangiectasia as well without signif GI bleeding in the past- [facility] he's also has [facility]angiectasis [facility]/ [facility] extensive nasal problems as above... ~  labs 2/09 showed Hg= 17.0 ~  labs 2/10 showed Hg= 16.4 ~  CXR 3/11 is chr incr markings esp RLL- no change, NAD...  ~  Labs 6/11 showed Hg= 15.9 ~  Labs 4/12 showed Hg= 16.0 ~  CXR 10/12 showed mild biapical scarring, RLL AVM, NAD... 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  Hx of CHEST PAIN (ICD-786.50) - seen Sep09 w/ several recent bouts of CP while working in yard, washing car, etc... resolved w/ rest & Protonix... EKG showed WNL, [facility] (no ischemia or infarction and EF= 61%)... Cardiac eval DrCrenshaw w/ 2DEcho 10/09 showing norm LV- no regional wall motion abn, norm LVF w/ EF= 60%, mild dil RA/ RV;  and norm MYOVIEW (no ischemia or infarction, EF= 58%)... ~  10/14: repeat cardiac eval by DrCrenshaw due to DParcw/ stairs; EKG 10/14 showed SBrady, rate57, otherw wnl;  2DEcho 10/14 showed normal LV size & wall thickness, norm LVF w/ EF=55-60%, Gr1DD, mildly thickened AoV leaflets, mild MR;  ETT 11/14 showed 822m on treadmill, hypertensive BP response, rare PVC, no ischemia, O2 sats  dropped to 87% w/ exercise ~  EKG 3/15 showed NSR, rate87, low volt, NAD...   HYPERCHOLESTEROLEMIA, MILD (ICD-272.0) - now on CRESTOR 618m/d>  Prev Rx w/ Simva40 but developed muscle cramping which resolved off the med. ~  FLP 6/08 showed TChol 208, TG 75, HDL 34, LDL 139... he preferred diet Rx- ~  FLP 2/09 showed TChol 172, TG 69, HDL 30, LDL 129... he agreed to CStanwood.. ~  [place] on Crestor10 showed TChol 91, TG 49, HDL 28, LDL 54... rec- keep same. ~  FLP 2/10 on Crestor10 showed TChol 108, TG 56, HDL 31, LDL 66 ~  9/10:  we discussed changing Crestor10 to Simvastatin40 for $$$ reasons... ~  [place] on Simva40 showed TChol 105, TG 59, HDL 39, LDL 54... continue same. ~  [place] Sim[place]40 showed TChol 104, TG 62, HDL 29, LDL 63... C/o severe muscle cramps & wants to stop for 137mocramping resolved! ~  Referred to Lipid Clinic & they restarted his Crestor at 18m62m> tol well so far... ~  FLP 10/12 on Cres5 showed TChol 114, TG 40, HDL 36, LDL 70 ~  FLP 2/13 on Cres5 showed TChol 124, TG 21, HDL 40, LDL 80 ~  FLP 4/14 on Cres5 showed TChol 119, TG 44, HDL 38, LDL 72  ~  6/14: pt stopped Cres5 on his own due to leg cramps & decided to hold statins for now w/ repeat FLP off meds in several months... ~  FLP 10/14 on diet alone showed TChol 155, TG 29, HDL 38, LDL 111  ~  FLP 4/15 on diet alone showed TChol 137, TG 30, HDL 36, LDL 95  HYPOTHYROIDISM - on SYNTHROID 112m51m now; prev 1218mc82mdose was decreased 4/12... ~  labs 2/09 showed TSH= 0.69...  8/09 showed TSH= 0.44... ~  labs 2/10 showed TSH= 0.68 ~  labs 3/11 showed TSH= 0.52 ~  Labs 4/12 on Synthroid125 showed TSH= 0.33... We decided to decr the dose to 112/d. ~  Labs 4/13 on Levo112 showed TSH= 4.60 ~  Labs 4/14 on Levo112 showed TSH= 5.46 ~  Labs 4/15 on Levo112 showed TSH= 9.26 and we decided to incr dose back to 1218mcg37m.  GERD, IRRITABLE BOWEL SYNDROME, COLONIC POLYPS - [facility] Protonix40 (he tried  OTC Zantac 718mg o74ms own)... also takes  LIBRAX [place] colonoscopy 1/04 w/ divertics, diminutive rectal polyp (adenomatous), hems...  ~  f/u colon 1/07 showed no recurrent polyps... ~  [facility]...39yrPERTROPHY PROSTATE W/UR OBST & OTH LUTS (ICD-600.01) - followed by DrRDavisPZWCHENIFLO 8mg/d (s58mped Flomax due to side effect- ED)... doing well without LTOS, just complains of nocturia x 1-2 ~  labs 2/09 showed PSA= 0.76 ~  labs 2/10 showed PSA= 0.90 ~  2011 PSA checked by DrDavis (pt states it was OK)... ~  Labs 4/12 showed PSA= 1.22 ~  Labs 4/13 showed PSA= 1.31 ~  10/13: presents c/o incr freq, nocturia x2-4, & weak stream despite Rapaflo; he is intol to Flomax; refer to Urology for further eval=> seen by DrEskridge, no changes made... ~  1/15: f/u by Urology, DrEskridge> BPH, LUTS, hypersens unstable bladder; on Rapaflo8 & doing satis, PSA reported to be 2.34...  LEFT ARM & SHOULDER PAIN >> he was checked by GboroOrthLacie Scottsimproving he says; [facility] [place]> notes reviewed.  BACK  PAIN, LUMBAR (ICD-724.2) - he's had therapy in the past and not currently bothered by LBP... ~  9/10:  requesting Rx for Robaxin for muscle spasm as needed.  PLANTAR FASCIITIS (ICD-728.71) - c/o pain in his feet secondary to plantar fasciitis eval & [facility]/ [facility] & shots... ~  9/10: we discussed change to [facility] - symptoms improved.  Hx NOCTURNAL LEG CRAMPS >>  ~  This initially occurred several yrs ago while on Simvastatin & the cramps resolved off this med; he was subseq placed on Cres5 & tol well... ~  6/14: he indicates that noct leg cramps returned x1wk & he stopped the Cres5 w/ improvement; he wants to leave off the statins for now; rec to try tonic water/ yellow mustard prn...  Hx of DIZZINESS (ICD-780.4) - sudden episode dizziness Sep09 working at [employer]  around [employer] w/ room spinning- lasted 83mn and resolved spontaneously, felt light headed afterwards... no LOC, syncope, seizure activity, etc... no CP/ palpit/ SOB/ etc... there were several EMTs in the restaurant and they checked him out- stable and transported to ER... 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  ANXIETY (ICD-300.00) & INSOMNIA, CHRONIC (ICD-307.42) - he uses CHLORAZEPATE 7.529mPrn & AMBIEN1080mhs for chronic persistant insomnia...  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  ANEMIA >>  ~  LABS 4/13 showed Hg= 15.2, MCV=91 ~  LABS 4/14 showed Hg= 14.5, MCV=84 ~  LABS 3/15 showed Hg= 13.6, MCV=78 ~  LABS 4/15 showed Hg= 11.3, MCV= 77, Iron studies & stool cards pending...  Health Maintenance: ~  GI:  [facility]ed [facility]MS[facility]up to [facility] on colon etc... (last 1/07 & 4yr83yr due 1/12). ~  GU:  [facility]; PSAs have all been wnl... ~  Immunizations:  he gets yearly Flu vaccinations in the fall;  OK Pneumovax 03/12/10 at [age]- [age]  Past Surgical History  Procedure Laterality Date  . Varicocelectomy  1991    Dr. RonaTresa EndoNasal sinus surgery      multiple times for recurrent epitaxis due to OWR disease  . Other surgical history      pulse laser for facial telangiectasias inthe past  . Excision left wrist ganglian/ myxoid cyst  05-30-2009  . Cardiovascular stress test  01-14-2003    NO ISCHEMIA / EF 61%/ NORMAL LE WALL MOTION  . Transthoracic echocardiogram  10-17-2008   DR CRENSHAW    NORMAL LVF/ EF 60%/  MILDLY DILATED RIGHT ATRIUM/ VENTRICULE  . 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 HaveRenda RollsOutpatient Encounter Prescriptions as of 04/10/2014  Medication Sig  . acetaminophen  (TYLENOL) 325 MG tablet Take 650 mg by mouth daily as needed (pain).  . Cholecalciferol (VITAMIN D3) 2000 UNITS TABS Take 2,000 Units by mouth daily.  . clorazepate (TRANXENE) 7.5 MG tablet Take 7.5 mg by mouth at bedtime as needed for sleep.  . leMarland Kitchenothyroxine (SYNTHROID, LEVOTHROID) 112 MCG tablet Take 1 tablet (112 mcg total) by mouth daily before breakfast.  . loratadine (CLARITIN) 10 MG tablet Take 10 mg by mouth daily.  . Magnesium 250 MG TABS Take 250 mg by mouth at  bedtime.   . meclizine (ANTIVERT) 25 MG tablet Take 1 tablet (25 mg total) by mouth daily as needed for dizziness.  . meloxicam (MOBIC) 7.5 MG tablet Take 7.5 mg by mouth daily.  . mometasone-formoterol (DULERA) 100-5 MCG/ACT AERO Inhale 2 puffs into the lungs 2 (two) times daily. Rinse after each use  . Multiple Vitamin (MULTIVITAMIN WITH MINERALS) TABS tablet Take 1 tablet by mouth daily. Centrum  . ondansetron (ZOFRAN) 4 MG tablet TAKE 1 TABLET BY MOUTH EVERY 6 HOURS AS NEEDED FOR NAUSEA/VOMITING  . pantoprazole (PROTONIX) 40 MG tablet Take 40 mg by mouth daily.  . silodosin (RAPAFLO) 8 MG CAPS capsule Take 1 capsule (8 mg total) by mouth at bedtime.  [name] zolpidem (AMBIEN) 10 MG tablet Take 1 tablet (10 mg total) by mouth at bedtime as needed for sleep.  . [DISCONTINUED] levothyroxine (SYNTHROID, LEVOTHROID) 112 MCG tablet Take 1 tablet (112 mcg total) by mouth daily before breakfast.  . [DISCONTINUED] silodosin (RAPAFLO) 8 MG CAPS capsule Take 8 mg by mouth at bedtime.  . [DISCONTINUED] zolpidem (AMBIEN) 10 MG tablet Take 10 mg by mouth at bedtime as needed for sleep.    Allergies  Allergen Reactions  . Zocor [Simvastatin - High Dose] Other (See Comments)    Leg cramps     Current Medications, Allergies, Past Medical History, Past Surgical History, Family History, and Social History were reviewed in [employer] record.    Review of Systems       See HPI - other systems neg except as  noted... The patient denies anorexia, fever, weight loss, weight gain, vision loss, decreased hearing, hoarseness, chest pain, syncope, dyspnea on exertion, peripheral edema, prolonged cough, headaches, hemoptysis, abdominal pain, melena, hematochezia, severe indigestion/heartburn, hematuria, incontinence, muscle weakness, suspicious skin lesions, transient blindness, difficulty walking, depression, unusual weight change, abnormal bleeding, enlarged lymph nodes, and angioedema.     Objective:   Physical Exam      WD, WN, 73 y/o WM in NAD... 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 GENERAL:  Alert & oriented; pleasant & cooperative... HEENT:  [place]/AT, EOM-wnl, PERRLA, EACs-clear, TMs-wnl, NOSE- occluded left nares from skin grafts, MOUTH- mucosal telangiectasias.  NECK:  Supple w/ fairROM; no JVD; normal carotid impulses w/o bruits; no thyromegaly or nodules palpated; no lymphadenopathy. CHEST:  Clear to P & A; without wheezes/ rales/ or rhonchi. HEART:  Regular Rhythm; without murmurs/ rubs/ or gallops. ABDOMEN:  Soft & nontender; normal bowel sounds; no organomegaly or masses detected. EXT: without deformities, mild arthritic changes; no varicose veins/ venous insuffic/ or edema. NEURO:  CN's intact; motor testing normal; sensory testing normal; gait normal & balance OK. DERM:  mult telangiectasias...  RADIOLOGY DATA:  Reviewed in the EPIC EMR & discussed w/ the patient...  LABORATORY DATA:  Reviewed in the EPIC EMR & discussed w/ the patient...   Assessment & Plan:    Hx Cough 10/13, Dyspnea on exertion, mild COPD >> as above, on Dulera100- 2sp Bid & exercis program- improved; we will continue to monitor his status & O2 sats...  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 CXR w/o change in the RLL AVM but this could certainly be an issue w/  his O2 desat w/ exercise & may need to be addressed if he gets worse...     Hx CP>  Prev cardiac w/u by Hilary Hertz  was neg, no recurrent CP complaints, but he notes DOE w/ stairs & DrCrenshaw repeated 2DEcho & ETT as above...     Chol>  Off Cres5 now due to recurrent cramps, on diet alone & FLP is reasonable (LDL=111)...     Hypothy>  On Synthroid 164mg/d now>  He is clinically & biochem euthyroid...     [facility]>  [facility]- neg x for hems & DrM rec f/u 553yr..     GU>  Hx BPH w/ BOO & followed by DrEskridge on Rapaflo [id number] & stable...     Ortho>  See above- stable on Mobic & OTC analgesics as needed...  LEG CRAMPS >> he is quite sure they are from his statin Rx & we decided to leave these off for now, try to control on diet/ exercise, f/u FLP in several months...     Anxiety/ Insomnia>  He has Chlorazepate for Prn use, and Ambien to use for sleep Qhs...   Patient's Medications  New Prescriptions   No medications on file  Previous Medications   ACETAMINOPHEN (TYLENOL) 325 MG TABLET    Take 650 mg by mouth daily as needed (pain).   CHOLECALCIFEROL (VITAMIN D3) 2000 UNITS TABS    Take 2,000 Units by mouth daily.   CLORAZEPATE (TRANXENE) 7.5 MG TABLET    Take 7.5 mg by mouth at bedtime as needed for sleep.   LORATADINE (CLARITIN) 10 MG TABLET    Take 10 mg by mouth daily.   MAGNESIUM 250 MG TABS    Take 250 mg by mouth at bedtime.    MECLIZINE (ANTIVERT) 25 MG TABLET    Take 1 tablet (25 mg total) by mouth daily as needed for dizziness.   MELOXICAM (MOBIC) 7.5 MG TABLET    Take 7.5 mg by mouth daily.   MOMETASONE-FORMOTEROL (DULERA) 100-5 MCG/ACT AERO    Inhale 2 puffs into the lungs 2 (two) times daily. Rinse after each use   MULTIPLE VITAMIN (MULTIVITAMIN WITH MINERALS) TABS TABLET    Take 1 tablet by mouth daily. Centrum   ONDANSETRON (ZOFRAN) 4 MG TABLET    TAKE 1 TABLET BY MOUTH EVERY 6 HOURS AS NEEDED FOR NAUSEA/VOMITING   PANTOPRAZOLE (PROTONIX) 40  MG TABLET    Take 40 mg by mouth daily.  Modified Medications   Modified Medication Previous Medication   LEVOTHYROXINE (SYNTHROID, LEVOTHROID) 112 MCG TABLET levothyroxine (SYNTHROID, LEVOTHROID) 112 MCG tablet      Take 1 tablet (112 mcg total) by mouth daily before breakfast.    Take 1 tablet (112 mcg total) by mouth daily before breakfast.   SILODOSIN (RAPAFLO) 8 MG CAPS CAPSULE silodosin (RAPAFLO) 8 MG CAPS capsule      Take 1 capsule (8 mg total) by mouth at bedtime.    Take 8 mg by mouth at bedtime.   ZOLPIDEM (AMBIEN) 10 MG TABLET zolpidem (AMBIEN) 10 MG tablet      Take 1 tablet (10 mg total) by mouth at bedtime as needed for sleep.    Take 10 mg by mouth at bedtime as needed for sleep.  Discontinued Medications   No medications on file

## 2014-04-10 NOTE — Patient Instructions (Signed)
Today we updated your med list in our EPIC system...    Continue your current medications the same...    We refilled your meds per request...  Please return to our lab one morning this week for your FASTING blood work...    We will contact you w/ the results when available...   Call for any questions...  Let's plan a follow up visit in 6mo, sooner if needed for problems...   

## 2014-04-16 ENCOUNTER — Other Ambulatory Visit (INDEPENDENT_AMBULATORY_CARE_PROVIDER_SITE_OTHER): Payer: Medicare Other

## 2014-04-16 DIAGNOSIS — D126 Benign neoplasm of colon, unspecified: Secondary | ICD-10-CM

## 2014-04-16 DIAGNOSIS — E7800 Pure hypercholesterolemia, unspecified: Secondary | ICD-10-CM

## 2014-04-16 DIAGNOSIS — K219 Gastro-esophageal reflux disease without esophagitis: Secondary | ICD-10-CM

## 2014-04-16 DIAGNOSIS — E039 Hypothyroidism, unspecified: Secondary | ICD-10-CM

## 2014-04-16 LAB — HEPATIC FUNCTION PANEL
ALT: 17 U/L (ref 0–53)
AST: 21 U/L (ref 0–37)
Albumin: 4.3 g/dL (ref 3.5–5.2)
Alkaline Phosphatase: 55 U/L (ref 39–117)
Bilirubin, Direct: 0.1 mg/dL (ref 0.0–0.3)
TOTAL PROTEIN: 7.2 g/dL (ref 6.0–8.3)
Total Bilirubin: 0.6 mg/dL (ref 0.3–1.2)

## 2014-04-16 LAB — LIPID PANEL
CHOLESTEROL: 137 mg/dL (ref 0–200)
HDL: 36 mg/dL — ABNORMAL LOW (ref >39.00)
LDL Cholesterol: 95 mg/dL (ref 0–99)
TRIGLYCERIDES: 30 mg/dL (ref 0.0–149.0)
Total CHOL/HDL Ratio: 4
VLDL: 6 mg/dL (ref 0.0–40.0)

## 2014-04-16 LAB — BASIC METABOLIC PANEL
BUN: 20 mg/dL (ref 6–23)
CO2: 26 meq/L (ref 19–32)
CREATININE: 1 mg/dL (ref 0.4–1.5)
Calcium: 9.1 mg/dL (ref 8.4–10.5)
Chloride: 103 mEq/L (ref 96–112)
GFR: 75.56 mL/min (ref >60.00)
Glucose, Bld: 84 mg/dL (ref 70–99)
Potassium: 4.2 mEq/L (ref 3.5–5.1)
Sodium: 137 mEq/L (ref 135–145)

## 2014-04-16 LAB — CBC WITH DIFFERENTIAL/PLATELET
BASOS PCT: 0.4 % (ref 0.0–3.0)
Basophils Absolute: 0 10*3/uL (ref 0.0–0.1)
EOS PCT: 1 % (ref 0.0–5.0)
Eosinophils Absolute: 0.1 10*3/uL (ref 0.0–0.7)
HCT: 35.5 % — ABNORMAL LOW (ref 39.0–52.0)
HEMOGLOBIN: 11.3 g/dL — AB (ref 13.0–17.0)
LYMPHS PCT: 34.1 % (ref 12.0–46.0)
Lymphs Abs: 3.3 10*3/uL (ref 0.7–4.0)
MCHC: 31.7 g/dL (ref 30.0–36.0)
MCV: 76.6 fl — ABNORMAL LOW (ref 78.0–100.0)
MONOS PCT: 7.9 % (ref 3.0–12.0)
Monocytes Absolute: 0.8 10*3/uL (ref 0.1–1.0)
NEUTROS ABS: 5.4 10*3/uL (ref 1.4–7.7)
NEUTROS PCT: 56.6 % (ref 43.0–77.0)
Platelets: 360 10*3/uL (ref 150.0–400.0)
RBC: 4.64 Mil/uL (ref 4.22–5.81)
RDW: 16.7 % — ABNORMAL HIGH (ref 11.5–14.6)
WBC: 9.6 10*3/uL (ref 4.5–10.5)

## 2014-04-16 LAB — TSH: TSH: 9.26 u[IU]/mL — ABNORMAL HIGH (ref 0.35–5.50)

## 2014-04-18 ENCOUNTER — Other Ambulatory Visit: Payer: Self-pay | Admitting: Pulmonary Disease

## 2014-04-18 DIAGNOSIS — D509 Iron deficiency anemia, unspecified: Secondary | ICD-10-CM

## 2014-04-18 MED ORDER — LEVOTHYROXINE SODIUM 125 MCG PO TABS: 125.0000 ug | ORAL_TABLET | Freq: Every day | ORAL | Status: DC

## 2014-04-23 ENCOUNTER — Other Ambulatory Visit (INDEPENDENT_AMBULATORY_CARE_PROVIDER_SITE_OTHER): Payer: Medicare Other

## 2014-04-23 DIAGNOSIS — D509 Iron deficiency anemia, unspecified: Secondary | ICD-10-CM

## 2014-04-23 LAB — IBC PANEL
IRON: 16 ug/dL — AB (ref 42–165)
SATURATION RATIOS: 3.6 % — AB (ref 20.0–50.0)
Transferrin: 316.5 mg/dL (ref 212.0–360.0)

## 2014-04-23 LAB — FERRITIN: Ferritin: 6.4 ng/mL — ABNORMAL LOW (ref 22.0–322.0)

## 2014-04-24 ENCOUNTER — Other Ambulatory Visit: Payer: Self-pay | Admitting: Pulmonary Disease

## 2014-04-24 DIAGNOSIS — E611 Iron deficiency: Secondary | ICD-10-CM

## 2014-04-29 ENCOUNTER — Telehealth: Payer: Self-pay | Admitting: Pulmonary Disease

## 2014-04-29 NOTE — Telephone Encounter (Signed)
Spoke with pt. He reports we told him to take vit C and iron daily. According to the stool cards it reports he should not take vit c until after stool cards have been done. Please advise SN thanks

## 2014-04-29 NOTE — Telephone Encounter (Signed)
Per SN---  Yes wait till he has collected the stool cards and then start the vit c with the iron.    Pt was on another call and he will call back once he is done.

## 2014-04-29 NOTE — Telephone Encounter (Signed)
Pt advised. Shakeria Robinette, CMA  

## 2014-05-09 ENCOUNTER — Other Ambulatory Visit: Payer: Medicare Other

## 2014-05-09 DIAGNOSIS — E611 Iron deficiency: Secondary | ICD-10-CM

## 2014-05-10 LAB — HEMOCCULT SLIDES (X 3 CARDS)
Fecal Occult Blood: NEGATIVE
OCCULT 1: POSITIVE — AB
OCCULT 2: POSITIVE — AB
OCCULT 3: POSITIVE — AB
OCCULT 4: POSITIVE — AB
OCCULT 5: NEGATIVE

## 2014-05-14 ENCOUNTER — Other Ambulatory Visit: Payer: Self-pay | Admitting: Pulmonary Disease

## 2014-05-14 DIAGNOSIS — K921 Melena: Secondary | ICD-10-CM

## 2014-07-09 ENCOUNTER — Telehealth: Payer: Self-pay | Admitting: Pulmonary Disease

## 2014-07-09 NOTE — Telephone Encounter (Signed)
Lm for pt to call back and schedule with [facility]

## 2014-07-21 ENCOUNTER — Other Ambulatory Visit: Payer: Self-pay | Admitting: Pulmonary Disease

## 2014-07-25 ENCOUNTER — Other Ambulatory Visit: Payer: Self-pay | Admitting: Pulmonary Disease

## 2014-07-25 MED ORDER — CLORAZEPATE DIPOTASSIUM 7.5 MG PO TABS: ORAL_TABLET | ORAL | Status: DC

## 2014-09-30 ENCOUNTER — Other Ambulatory Visit: Payer: Self-pay | Admitting: Pulmonary Disease

## 2014-10-05 ENCOUNTER — Other Ambulatory Visit: Payer: Self-pay | Admitting: Pulmonary Disease

## 2014-10-07 ENCOUNTER — Other Ambulatory Visit: Payer: Self-pay | Admitting: Pulmonary Disease

## 2014-10-08 ENCOUNTER — Ambulatory Visit (INDEPENDENT_AMBULATORY_CARE_PROVIDER_SITE_OTHER): Payer: Medicare Other | Admitting: Pulmonary Disease

## 2014-10-08 ENCOUNTER — Encounter: Payer: Self-pay | Admitting: Pulmonary Disease

## 2014-10-08 ENCOUNTER — Other Ambulatory Visit (INDEPENDENT_AMBULATORY_CARE_PROVIDER_SITE_OTHER): Payer: Medicare Other

## 2014-10-08 VITALS — BP 120/80 | HR 66 | Temp 97.5°F | Ht 70.0 in | Wt 188.2 lb

## 2014-10-08 DIAGNOSIS — F411 Generalized anxiety disorder: Secondary | ICD-10-CM

## 2014-10-08 DIAGNOSIS — D126 Benign neoplasm of colon, unspecified: Secondary | ICD-10-CM

## 2014-10-08 DIAGNOSIS — E7800 Pure hypercholesterolemia, unspecified: Secondary | ICD-10-CM

## 2014-10-08 DIAGNOSIS — E039 Hypothyroidism, unspecified: Secondary | ICD-10-CM

## 2014-10-08 DIAGNOSIS — K589 Irritable bowel syndrome without diarrhea: Secondary | ICD-10-CM

## 2014-10-08 DIAGNOSIS — D500 Iron deficiency anemia secondary to blood loss (chronic): Secondary | ICD-10-CM

## 2014-10-08 DIAGNOSIS — N401 Enlarged prostate with lower urinary tract symptoms: Secondary | ICD-10-CM

## 2014-10-08 DIAGNOSIS — K219 Gastro-esophageal reflux disease without esophagitis: Secondary | ICD-10-CM

## 2014-10-08 DIAGNOSIS — J3489 Other specified disorders of nose and nasal sinuses: Secondary | ICD-10-CM

## 2014-10-08 DIAGNOSIS — I780 Hereditary hemorrhagic telangiectasia: Secondary | ICD-10-CM

## 2014-10-08 DIAGNOSIS — N138 Other obstructive and reflux uropathy: Secondary | ICD-10-CM

## 2014-10-08 HISTORY — DX: Iron deficiency anemia secondary to blood loss (chronic): D50.0

## 2014-10-08 LAB — CBC WITH DIFFERENTIAL/PLATELET
BASOS ABS: 0.1 10*3/uL (ref 0.0–0.1)
BASOS PCT: 0.6 % (ref 0.0–3.0)
EOS ABS: 0.1 10*3/uL (ref 0.0–0.7)
Eosinophils Relative: 1.4 % (ref 0.0–5.0)
HEMATOCRIT: 50 % (ref 39.0–52.0)
Hemoglobin: 16.5 g/dL (ref 13.0–17.0)
LYMPHS ABS: 3 10*3/uL (ref 0.7–4.0)
LYMPHS PCT: 32.2 % (ref 12.0–46.0)
MCHC: 33.1 g/dL (ref 30.0–36.0)
MCV: 91.2 fl (ref 78.0–100.0)
MONOS PCT: 9.3 % (ref 3.0–12.0)
Monocytes Absolute: 0.9 10*3/uL (ref 0.1–1.0)
Neutro Abs: 5.2 10*3/uL (ref 1.4–7.7)
Neutrophils Relative %: 56.5 % (ref 43.0–77.0)
Platelets: 271 10*3/uL (ref 150.0–400.0)
RBC: 5.48 Mil/uL (ref 4.22–5.81)
RDW: 14 % (ref 11.5–15.5)
WBC: 9.2 10*3/uL (ref 4.0–10.5)

## 2014-10-08 LAB — IBC PANEL
Iron: 88 ug/dL (ref 42–165)
SATURATION RATIOS: 23.3 % (ref 20.0–50.0)
TRANSFERRIN: 269.8 mg/dL (ref 212.0–360.0)

## 2014-10-08 LAB — TSH: TSH: 5.1 u[IU]/mL — ABNORMAL HIGH (ref 0.35–4.50)

## 2014-10-08 LAB — FERRITIN: Ferritin: 35.9 ng/mL (ref 22.0–322.0)

## 2014-10-08 MED ORDER — ZOLPIDEM TARTRATE 10 MG PO TABS: 10.0000 mg | ORAL_TABLET | Freq: Every evening | ORAL | Status: DC | PRN

## 2014-10-08 MED ORDER — FIRST-DUKES MOUTHWASH MT SUSP: OROMUCOSAL | Status: DC

## 2014-10-08 NOTE — Patient Instructions (Signed)
Today we updated your med list in our EPIC system...    Continue your current medications the same...  Today we gave you the 2015 flu vaccine...  We also did your follow up blood work...    We will contact you w/ the results when available...   Continue your exercise program 7 consider starting a YOGA regimen...  Call for any questions...  Let's plan a follow up visit in 75mo, sooner if needed for problems.Marland KitchenMarland Kitchen

## 2014-10-08 NOTE — Progress Notes (Signed)
Subjective:    Patient ID: Gregory [name], male    DOB: 1941-05-10, 73 y.o.   MRN: [id number]  HPI 73 y/o WM here for a follow up visit... he has multiple medical problems including:  Hx OWR syndrome;  Hx atypCP;  Hyperchol;  Hypothyroidism;  GERD/ Divertics/ IBS/ Colon polyps;  BPH/ BOO;  DJD/ LBP/ Plantar fasciitis;  Hx dizziness;  Anxiety & chronic persistant insomnia...  ~  September 27, 2012:  19moROV & Gregory CC= chronic cough, mostly dry, some congestion, little phlegm/ no blood/ etc; he saw TP 8/13 & given Hydromet/ Delsym/ Mobic/ Lozenges & these helped but prob is persistent;  Chest is clear, labs 4/13 normal, CXR 8/13 stable;  We reviewed options and decided to try Advair-HFA 115/21- 2puffsBid plus cough drops etc...     His list of questions today> 1) chronic nasal prob w/ continued freq bleeding & followed by DBarbaraann [name] continue saline Rx;  2) he reports mother passed away at age 789recently after being Dx w/ pancreatic ca;  3) [facility] & he is pending Moh's next week;  4) he has noted incr urine freq, weak stream, & nocturia x2-4 despite his Rapaflo med- we will set up referral to Urology for further eval...    We reviewed prob list, meds, xrays and labs> see below for updates >> OK Flu shot today...  ~  March 28, 2013:  642moOV & [name] had several interval complaints> skin cancer removed from his forehead, cortisone shot in his knee, c/o dry throat & intermitt hoarse (Rx w/ lozenges, throat drops), he's been traveling w/ his job & doing ok w/o CP SOB etc...     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    Hx Cough> resolved w/ [id number] just using prn now; + Hycodan, Claritin, Mucinex, MMW- c/o dry throat & encouraged to use lozenges; he had URI treated by TP 1/14  w/esolved...    Hx CP>  Prev cardiac w/u by Gregory [name] was neg, no recurrent CP complaints, he remains active, etc...    Chol>  On Crestor5 now; he stopped Simva40 due to muscle cramps which resolved off this med; FLB[place]/14 showed TChol 119, TG 44, HDL 38, LDL 72    Hypothy>  On Synthroid 11227md now>  Labs 4/12 revealed TSH= 0.33 on Synthroid125 so we decr the dose slightly to 112; f/u TSH= 4.60 & 5.46    GI- GERD, IBS, Polyps> on Protonix40Qod & Librax prn; followed by Gregory [name] [facility] colonoscopy 3/12- neg x for hems & DrM rec f/u 76yr79yr    GU>  Hx BPH w/ BOO & prev followed by DrRDNWGNFAOZRapaflo 8mg/15m He saw Gregory [name] 12/13 who notes a slow PSAV c/wBPH & DRE was wnl...    Ortho>  On Mobic7.5 + OTC meds; eval 12/11 by Gregory [name] for bilat leg pain & cramping, ?min atrophy of left gastroc- he rec tonic water & neurology eval> he saw Gregory [name] 3/12 (note reviewed)> NCV was normal; EMG c/w mild S1 radiculopathy; he rec MRI- pt decided against the MRI (we did not get f/u note);  Now c/o some right arm/ shoulder symptoms & I have rec eval by Gregory [name] to sort this out...    Anxiety/ Insomnia>  He has Chlorazepate7.5 for Prn  use, and Ambien10 to use for sleep Qhs... We reviewed prob list, meds, xrays and labs> see below for updates >>   LABS 4/14:  FLP- at goals on Cres5;  Chems- wnl;  CBC- wnl;  TSH=5.46 on Synthroid112;  VitD=50;  PSA=2.56     ~  June 11, 2013:  2-35moROV & add-on appt for leg cramps> these occur mostly at night & wake him up; he stopped his Cres5 on his own & he swears that the cramps are better off this med; he recalls that yrs ago he had the same problem from SBuckshotchanging to Cres5 resolved the issue until recently;  We discussed the options and decided to leave off the statins for now, try to control Chol w/ diet & exercise, f/u FLP soon...  We reviewed the [employer] suggestions for Tonic Water, yellow mustard, bar of soap in the bed...     We reviewed prob list, meds, xrays  and labs> see below for updates >>   ~  October 04, 2013:  459moOV & BoMikki Santeeecently saw Gregory [name] c/o SOB (eg- climbing stairs) & dizzy (no assoc symptoms)> known OWR, no change on exam, EKG was normal; they plan f/u 2DEcho & GXT... 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    Hx Cough> resolved w/ [id number] off now; + Hycodan, Claritin, Mucinex, MMW- c/o dry throat & encouraged to use lozenges...    Hx CP>  Prev cardiac w/u by Gregory [name] was neg, no recurrent CP complaints, he remains active but notes DOE w/ stairs, ok on level ground, Gregory [name] plans f/u 2DEcho & GXT soon...    Chol> off Crestor now due to musc cramps; he stopped Simva40 due to muscle cramps which resolved off this med; FLP 10/14 on diet alone showed TChol 155, TG 29, HDL 38, LDL 111    Hypothy>  On Synthroid 11269md now>  Labs 4/12 revealed TSH= 0.33 on Synthroid125 so we decr the dose slightly to 112;  f/u TSH= 4.60 & 5.46    GI- GERD, IBS, Polyps> on Protonix40Qd & Librax prn; [name] neg x for hems & DrM rec f/u 34yr16yr    GU>  Hx BPH w/ BOO & prev followed by DrRDPVXYIAXKRapaflo 8mg/63m He saw Gregory [name] 12/13 who notes a slow PSAV c/w BPH & DRE was wnl...    Ortho>  On Mobic7.5 + OTC meds; eval 12/11 by Gregory [name] for bilat leg pain & cramping, ?min atrophy of left gastroc- he rec tonic water & neurology eval> he saw Gregory [name] 3/12 (note reviewed)> NCV was normal; EMG c/w mild S1 radiculopathy; he rec MRI- pt decided against the MRI (we did not get f/u note)...    Anxiety/ Insomnia>  He has Chlorazepate7.5 for Prn use, and Ambien10 to use for sleep Qhs... 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  LABS 10/14:  FLP- on diet alone looks pretty good w/ LDL=111...  ~  November 21, 2013:  6wk [name] & add-on appt after cardiac eval; he notes incr DOE w/ stairs over the last few months and  saw Cards- DrCrenshaw10/14 (note reviewed, last eval 2009 w/ Echo & Myoview); exam was neg & they decided to repeat the studies;   EKG 10/14 showed SBrady, rate57, otherw wnl;  2DEcho 10/14 showed normal LV size & wall thickness, norm LVF w/ EF=55-60%, Gr1DD, mildly thickened AoV leaflets, mild MR;  ETT 11/14 showed 71mn on treadmill, hypertensive BP response, rare PVC, no ischemia, O2 sats dropped to 87% w/ exercise...  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 Given this mild airflow obstruction we will start Rx w/ Dulera100- 2spBid and plan f/u in 2 mo... Rec to start a gradual exercise program...  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  We decided to continue the same Rx & plan for now...  ~  April 10, 2014:  62m50moV & post ER check> pt was  seen by TP w/ gastroent, n/v, weakness, postural, & hypoxemic=> sent to ER for IVF & improved, sent home w/ Zofran and symptoms resolved... He is currently feeling well & continues to improve w/ his Dulera and exercise program- sl SOB at top of stairs but improving; exercising on treadmill regularly, still working w/ [employer] but planning on retirement by end of the yr... 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    Hx CP>  Prev cardiac w/u by DrCHilary Hertzs neg, no recurrent CP complaints, he remains active but notes DOE w/ stairs, ok on level ground, 2DEcho & GXT were OK x O2 sat drop to 87% at peak exercise- ?incr shunting thru his RLL AVM...    Chol> off Crestor now due to musc cramps; he prev stopped Simva40 due to muscle cramps as well; FLP 10/14 on diet alone showed TChol 155, TG 29, HDL 38, LDL 111...    Hypothy>  On Synthroid 112m79m now>  Labs 4/12 revealed TSH= 0.33 on Synthroid125 so we decr the dose slightly to 112;  f/u TSH= 4.60 & 5.46...    GI- GERD, IBS, Polyps> on Prilosec40Qd & Zofran/ Librax prn; [name]  [facility]- neg x for hems & DrM rec f/u 73yr...    GU>  Hx BPH w/ BOO on Rapaflo 867md per Gregory [name]; last seen 1/15 & doing satis w/ PSA= 2.34    Ortho>  On Mobic7.5 prn + OTC meds; eval 12/11 by Gregory [name] for bilat leg pain & cramping, ?min atrophy of left gastroc- he rec tonic water & neurology eval> he saw Gregory [name] 3/12 (note reviewed)> NCV was normal; EMG c/w mild S1 radiculopathy; he rec MRI- pt decided  against the MRI (we did not get f/u note)...    Anxiety/ Insomnia>  He has Chlorazepate7.5 for Prn use, and Ambien10 to use for sleep Qhs... We reviewed prob list, meds, xrays and labs> see below for updates >>   CXR 3/15 showed normal heart size, clear lungs, mild apical scarring, right basilar density c/w AVM based on CT report 2004, NAD...Marland KitchenMarland KitchenEKG 3/15 showed NSR, rate87, low volt, NAD...  ADDENDUM> LABS 4/15 showed>  FLP- TChol 137, TG 30, HDL 36, LDL 95;  Chems- wnl;  CBC- anemic w/ Hg=11.3 & MCV=77;  TSH=9.26... REC>  Ret for Iron panel (Fe=16- 4%sat), Ferritin (6.4), Stool cards (pending); then start FeSO4-32547mD w/ VitC500... Recheck CBC, Iron level in 6-8weeks...             Taking Synthroid112 regularly, therefore increase back to 125m68m... Recheck TSH in 64mo=38mo never returned.  ~  October 08, 2014:  104mo R49mo [name] isMikki Santeeill working but has cut back to 2d/wk; feeling better overall and most of his dyspnea symptoms resolved w/ FeSO4 7 return of his Hg to normal; energy is good & he exercises on treadmill, doing better on stairs, etc; [facility] improved back to baseline...  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    Hx CP>  Prev cardiac w/u by DrCrenHilary Hertzeg, no recurrent CP complaints, he remains active but notes DOE w/ stairs, ok on level ground, 2DEcho & GXT were OK x O2 sat drop to 87% at peak exercise- ?incr shunting thru his RLL AVM...    Chol> Intol to Simva40 & Cres5 due to musc  cramps; FLP 4/15 on diet alone showed TChol 137, TG 30, HDL 36, LDL 95...    Hypothy>  On Synthroid 125mcg/99mw>  Labs 10/15 revealed TSH= 5.10 & clinically euthyroid, continue same...    GI- GERD, IBS, Polyps> on Prilosec40Qd & Zofran/ Librax prn; [name]> note reviewed (heme pos stool & anemia from epistaxis & NSAIDs); he had colonoscopy 3/12- neg x for hems & DrM rec f/u 84yrs...43yrGU>  Hx BPH w/ BOO on Rapaflo 8mg/d pe52mrEskridge; last seen 1/15 & doing satis w/ PSA= 2.34    Ortho> on Mobic7.5 prn + OTC meds; eval 12/11 by Gregory [name] for bilat leg pain & cramping, ?min atrophy of left gastroc- he rec tonic water & neurology eval> he saw Gregory [name] 3/12 (note reviewed)> NCV was normal; EMG c/w  mild S1 radiculopathy; he rec MRI- pt decided against the MRI (we did not get f/u note)...    Anxiety/ Insomnia>  He has Chlorazepate7.5 for Prn use, and Ambien10 to use for sleep Qhs... We reviewed prob list, meds, xrays and labs> see below for updates >> OK 2015 Flu shot today...   LABS 10/15:  CBC- wnl w/ Hg= 16.5, Fe=88 (23%sat), Ferritin=36;  TSH=5.10... REC>  Continue Iron supplement daily, and the Synthroid125/d...            Problem List:   OTHER DISEASES OF NASAL CAVITY AND SINUSES - Hx of Osler-Weber-Rendue syndrome/ hereditary telangiectasias... he's had numerous nose bleeds and surgery by Etter Sjogren, now followed by Barbaraann [name]... left nares is occluded w/ skin graft- no lumen... this is quite uncomfortable to the patient but he's been told there is nothing further that can be done for this... ~  3/11:  notes occas nosebleeds but he is able to handle them... ~  4/12:  No diff in his pattern of mild nose bleeds & he denies any severe epistaxis episodes... 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 ~  4/14:  He notes infreq bleeding now w/ saline etc... 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 Dyspnea w/ O2 desat w/ exercise >>  ~  12/14:  PFTs showed FVC=3.98 (89%), FEV1=2.72 (79%), %1sec=68, and mid-flows= 51% predicted; c/w mild obstructive dis & we decided on a trial of Dulera100-2spBid ~  4/15:  His breathing is improved w/ the [facility] and exercise program... ~  10/15:  His breathing is back to baseline after FeSO4 rx for anemia & ret of Hg to normal; he has stopped the [facility] & has it handy for prn use...  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 known GI telangiectasia as well without signif GI bleeding in the past- [facility]... he's also has skin telangiectasis w/ prev laser therapy at [name]... extensive nasal problems as above... ~  labs 2/09 showed Hg= 17.0 ~  labs 2/10 showed Hg= 16.4 ~  CXR 3/11 is chr incr markings esp RLL- no change, NAD...  ~  Labs 6/11 showed Hg= 15.9 ~  Labs 4/12 showed Hg= 16.0 ~  CXR 10/12 showed mild biapical scarring, RLL AVM, NAD... ~  Labs 4/13 showed Hg= 15.2 ~  CXR 8/13 showed heart at upper lim of norm, increased markings & apical scarring, chr incr markings in RLL- AVM, NAD.[name]. 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 ~  CXR 3/15 showed normal heart size, clear lungs, mild apical scarring, right basilar density c/w AVM based on CT report 2004, NAD...  Hx of CHEST PAIN (ICD-786.50) - seen Sep09 w/ several recent bouts of CP while working in yard, washing car, etc...  resolved w/ rest &  Protonix... EKG showed WNL, prev [facility]St[facility]Test 1/04 at [name] was neg (no ischemia or infarction and EF= 61%)... Cardiac eval Gregory [name] w/ 2DEcho 10/09 showing norm LV- no regional wall motion abn, norm LVF w/ EF= 60%, mild dil RA/ RV;  and norm MYOVIEW (no ischemia or infarction, EF= 58%)... ~  10/14: repeat cardiac eval by Gregory [name] due to [place] w/ stairs; EKG 10/14 showed SBrady, rate57, otherw wnl;  2DEcho 10/14 showed normal LV size & wall thickness, norm LVF w/ EF=55-60%, Gr1DD, mildly thickened AoV leaflets, mild MR;  ETT 11/14 showed 66mn on treadmill, hypertensive BP response, rare PVC, no ischemia, O2 sats dropped to 87% w/ exercise ~  EKG 3/15 showed NSR, rate87, low volt, NAD...   HYPERCHOLESTEROLEMIA, MILD (ICD-272.0) - now on CRESTOR 520md>  Prev Rx w/ Simva40 but developed muscle cramping which resolved off the med. ~  FLP 6/08 showed TChol 208, TG 75, HDL 34, LDL 139... he preferred diet Rx- ~  FLP 2/09 showed TChol 172, TG 69, HDL 30, LDL 129... he agreed to CrLee. ~  [place]/09 on Crestor10 showed TChol 91, TG 49, HDL 28, LDL 54... rec- keep same. ~  FLP 2/10 on Crestor10 showed TChol 108, TG 56, HDL 31, LDL 66 ~  9/10:  we discussed changing Crestor10 to Simvastatin40 for $$$ reasons... ~  [place]/11 on Simva40 showed TChol 105, TG 59, HDL 39, LDL 54... continue same. ~  [place]/12 on Simva40 showed TChol 104, TG 62, HDL 29, LDL 63... C/o severe muscle cramps & wants to stop for 38m78moramping resolved! ~  Referred to [facility] & they restarted his Crestor at 5mg78m tol well so far... ~  FLP 10/12 on Cres5 showed TChol 114, TG 40, HDL 36, LDL 70 ~  FLP 2/13 on Cres5 showed TChol 124, TG 21, HDL 40, LDL 80 ~  FLP 4/14 on Cres5 showed TChol 119, TG 44, HDL 38, LDL 72  ~  6/14: pt stopped Cres5 on his own due to leg cramps & decided to hold statins for now w/ repeat FLP off meds in several months... ~  FLP 10/14 on diet alone showed TChol 155, TG 29, HDL  38, LDL 111  ~  FLP 4/15 on diet alone showed TChol 137, TG 30, HDL 36, LDL 95  HYPOTHYROIDISM - on SYNTHROID 112mc8mnow; prev 125mcg30mose was decreased 4/12... ~  labs 2/09 showed TSH= 0.69...  8/09 showed TSH= 0.44... ~  labs 2/10 showed TSH= 0.68 ~  labs 3/11 showed TSH= 0.52 ~  Labs 4/12 on Synthroid125 showed TSH= 0.33... We decided to decr the dose to 112/d. ~  Labs 4/13 on Levo112 showed TSH= 4.60 ~  Labs 4/14 on Levo112 showed TSH= 5.46 ~  Labs 4/15 on Levo112 showed TSH= 9.26 and we decided to incr dose back to 125mcg/50m  GERD, IRRITABLE BOWEL SYNDROME, COLONIC POLYPS - [name] (he tried OTC Zantac 75mg on538m own)... also takes  LIBRAX PHilltopcolonoscopy 1/04 w/ divertics, diminutive rectal polyp (adenomatous), hems...  ~  f/u colon 1/07 showed no recurrent polyps... ~  [name]... 76yrERTROPHY PROSTATE W/UR OBST & OTH LUTS (ICD-600.01) - followed by DrRDavis GYIRSWNILO 8mg/d (st64med Flomax due to side effect- ED)... doing well without LTOS, just complains of nocturia x 1-2 ~  labs 2/09 showed PSA= 0.76 ~  labs 2/10 showed PSA= 0.90 ~  2011 PSA checked by DrDavis (pt states it was OK)... ~  Labs 4/12 showed PSA= 1.22 ~  Labs 4/13 showed PSA= 1.31 ~  10/13: presents c/o incr freq, nocturia x2-4, & weak stream despite Rapaflo; he is intol to Flomax; refer to Urology for further eval=> seen by Gregory [name], no changes made... ~  1/15: f/u by Urology, Gregory [name]> BPH, LUTS, hypersens unstable bladder; on Rapaflo8 & doing satis, PSA reported to be 2.34...  LEFT ARM & SHOULDER PAIN >> he was checked by Lacie Scotts but not improving he says; we discussed second opinion at the [facility], Gregory [name]==> notes reviewed.  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 ~  9/10:  requesting Rx for Robaxin for muscle spasm as  needed.  PLANTAR FASCIITIS (ICD-728.71) - c/o pain in his feet secondary to plantar fasciitis eval & Rx from [facility] w/ Celebrex & shots... ~  9/10: we discussed change to [name] - symptoms improved.  Hx NOCTURNAL LEG CRAMPS >>  ~  This initially occurred several yrs ago while on Simvastatin & the cramps resolved off this med; he was subseq placed on Cres5 & tol well... ~  6/14: he indicates that noct leg cramps returned x1wk & he stopped the Cres5 w/ improvement; he wants to leave off the statins for now; rec to try tonic water/ yellow mustard prn...  Hx of DIZZINESS (ICD-780.4) - sudden episode dizziness Sep09 working at [employer] around [employer] w/ room spinning- lasted 59mn and resolved spontaneously, felt light headed afterwards... no LOC, syncope, seizure activity, etc... no CP/ palpit/ SOB/ etc... there were several EMTs in the restaurant and they checked him out- stable and transported to ER... 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  ANXIETY (ICD-300.00) & INSOMNIA, CHRONIC (ICD-307.42) - he uses CHLORAZEPATE 7.532mPrn & AMBIEN1046mhs for chronic persistant insomnia...  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  ANEMIA >>  ~  LABS 4/13 showed Hg= 15.2, MCV=91 ~  LABS 4/14 showed Hg= 14.5, MCV=84 ~  LABS 3/15 showed Hg= 13.6, MCV=78 ~  LABS 4/15 showed Hg= 11.3, MCV= 77, Iron studies showed Fe= 16 (3.6%sat), Ferritin= 6.4, & stool cards neg; Rx w/ FeSO4... ~  Labs 10/15 showed Hg= 16.5, MCV= 91, Fe=88 (23%sat), Ferritin= 36...   Health Maintenance: ~  GI:  [facility]ed [facility]up to date on colon etc... (last 1/07 & 63yr96yr due 1/12). ~  GU:  followed by [name]; PSAs have all been wnl... ~  Immunizations:  he gets  yearly Flu vaccinations in the fall;  OK Pneumovax 03/12/10 at age- [age]  Past Surgical History  Procedure Laterality Date  . Varicocelectomy  1991    Dr. RonaTresa EndoNasal sinus surgery      multiple times for recurrent epitaxis due to OWR disease  . Other surgical history      pulse laser for facial telangiectasias inthe past  . Excision left wrist ganglian/ myxoid cyst  05-30-2009  . Cardiovascular stress test  01-14-2003    NO ISCHEMIA / EF 61%/ NORMAL LE WALL MOTION  . Transthoracic echocardiogram  10-17-2008   DR CRENStanford Breed  NORMAL LVF/ EF 60%/  MILDLY DILATED RIGHT ATRIUM/ VENTRICULE  . 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 Renda Rolls    Outpatient Encounter Prescriptions as of 10/08/2014  Medication Sig  . acetaminophen (TYLENOL) 325 MG tablet Take 650 mg by mouth daily as needed (pain).  . Cholecalciferol (VITAMIN D3) 2000 UNITS TABS Take 2,000 Units by mouth daily.  . clorazepate (TRANXENE) 7.5 MG tablet TAKE 1 TABLET BY MOUTH 3 TIMES A DAY AS NEEDED  . levothyroxine (SYNTHROID, LEVOTHROID) 125 MCG tablet TAKE 1 TABLET (125 MCG TOTAL) BY MOUTH DAILY BEFORE BREAKFAST.  [name] loratadine (CLARITIN) 10 MG tablet Take 10 mg by mouth daily.  . Magnesium 250 MG TABS Take 250 mg by mouth at bedtime.   . meclizine (ANTIVERT) 25 MG tablet Take 1 tablet (25 mg total) by mouth daily as needed for dizziness.  . Multiple Vitamin (MULTIVITAMIN WITH MINERALS) TABS tablet Take 1 tablet by mouth daily. Centrum  . pantoprazole (PROTONIX) 40 MG tablet TAKE 1 TABLET EVERY DAY  . RAPAFLO 8 MG CAPS capsule TAKE ONE CAPSULE AT BEDTIME  . zolpidem (AMBIEN) 10 MG tablet Take 1 tablet (10 mg total) by mouth at bedtime as needed for sleep.  . mometasone-formoterol (DULERA) 100-5 MCG/ACT AERO Inhale 2 puffs into the lungs 2 (two) times daily. Rinse after each use  . [DISCONTINUED] meloxicam (MOBIC) 7.5 MG tablet Take 7.5 mg by mouth daily.  . [DISCONTINUED] ondansetron (ZOFRAN) 4 MG  tablet TAKE 1 TABLET BY MOUTH EVERY 6 HOURS AS NEEDED FOR NAUSEA/VOMITING  . [DISCONTINUED] pantoprazole (PROTONIX) 40 MG tablet Take 40 mg by mouth daily.  . [DISCONTINUED] silodosin (RAPAFLO) 8 MG CAPS capsule Take 1 capsule (8 mg total) by mouth at bedtime.    Allergies  Allergen Reactions  . Zocor [Simvastatin - High Dose] Other (See Comments)    Leg cramps     Current Medications, Allergies, Past Medical History, Past Surgical History, Family History, and Social History were reviewed in [employer] record.    Review of Systems       See HPI - other systems neg except as noted... The patient denies anorexia, fever, weight loss, weight gain, vision loss, decreased hearing, hoarseness, chest pain, syncope, dyspnea on exertion, peripheral edema, prolonged cough, headaches, hemoptysis, abdominal pain, melena, hematochezia, severe indigestion/heartburn, hematuria, incontinence, muscle weakness, suspicious skin lesions, transient blindness, difficulty walking, depression, unusual weight change, abnormal bleeding, enlarged lymph nodes, and angioedema.     Objective:   Physical Exam      WD, WN, 73 y/o WM in NAD... 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 GENERAL:  Alert & oriented; pleasant & cooperative... HEENT:  [place]/AT, EOM-wnl, PERRLA, EACs-clear, TMs-wnl, NOSE- occluded left nares from skin grafts, MOUTH- mucosal telangiectasias.  NECK:  Supple w/ fairROM; no JVD; normal carotid impulses w/o bruits; no thyromegaly or nodules palpated; no lymphadenopathy. CHEST:  Clear to P & A; without wheezes/ rales/ or rhonchi. HEART:  Regular Rhythm; without murmurs/ rubs/ or gallops. ABDOMEN:  Soft & nontender; normal bowel sounds; no organomegaly or masses detected. EXT: without deformities, mild arthritic changes; no varicose veins/ venous insuffic/ or edema. NEURO:  CN's intact; motor testing normal; sensory testing normal; gait normal & balance OK. DERM:  mult  telangiectasias...  RADIOLOGY DATA:  Reviewed in the EPIC EMR & discussed w/ the patient...  LABORATORY DATA:  Reviewed in the EPIC EMR & discussed w/ the patient...   Assessment & Plan:  Hx Cough 10/13, Dyspnea on exertion, mild COPD >> as above, prev on Dulera100- 2sp Bid & exercis program- improved; we will continue to monitor his status & O2 sats; Dyspnea improved w/ Fe for anemia & ret of Hg to norm...  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 CXR w/o change in the RLL AVM but this could certainly be an issue w/ his O2 desat w/ exercise & may need to be addressed if he gets worse...     Hx CP>  Prev cardiac w/u by Gregory [name] was neg, no recurrent CP complaints, but he notes DOE w/ stairs & Gregory [name] repeated 2DEcho & ETT as above...     Chol>  Off statins due to recurrent cramps, on diet alone & FLP is reasonable...     Hypothy>  On Synthroid 180mg/d now>  He is clinically & biochem euthyroid...     GI>  [facility]- neg x for hems & DrM rec f/u 564yr..     GU>  Hx BPH w/ BOO & followed by Gregory [name] on Rapaflo 84m42m & stable...     Ortho>  See above- stable on Mobic & OTC analgesics as needed...  LEG CRAMPS >> he is quite sure they are from his statin Rx & we decided to leave these off for now, try to control on diet/ exercise, f/u FLP in several months...     Anxiety/ Insomnia>  He has Chlorazepate for Prn use, and Ambien to use for sleep Qhs...  Anemia>  On FeSO4 supplement & Hg now back to normal, dyspnea ret to baseline...   Patient's Medications  New Prescriptions   DIPHENHYD-HYDROCORT-NYSTATIN (FIRST-DUKES MOUTHWASH) SUSP    1 tsp gargle and swallow four times daily  Previous Medications   ACETAMINOPHEN (TYLENOL) 325 MG TABLET    Take 650 mg by mouth daily as needed (pain).   ASCORBIC ACID  (VITAMIN C) 1000 MG TABLET    Take 2,000 mg by mouth daily.   CHOLECALCIFEROL (VITAMIN D3) 2000 UNITS TABS    Take 2,000 Units by mouth daily.   CLORAZEPATE (TRANXENE) 7.5 MG TABLET    TAKE 1 TABLET BY MOUTH 3 TIMES A DAY AS NEEDED   FERROUS SULFATE 325 (65 FE) MG TABLET    Take 325 mg by mouth daily with breakfast.   LEVOTHYROXINE (SYNTHROID, LEVOTHROID) 125 MCG TABLET    TAKE 1 TABLET (125 MCG TOTAL) BY MOUTH DAILY BEFORE BREAKFAST.   LORATADINE (CLARITIN) 10 MG TABLET    Take 10 mg by mouth daily.   MAGNESIUM 250 MG TABS    Take 250 mg by mouth at bedtime.    MECLIZINE (ANTIVERT) 25 MG TABLET    Take 1 tablet (25 mg total) by mouth daily as needed for dizziness.   MOMETASONE-FORMOTEROL (DULERA) 100-5 MCG/ACT AERO    Inhale 2 puffs into the lungs 2 (two) times daily. Rinse after each use   MULTIPLE VITAMIN (MULTIVITAMIN WITH MINERALS) TABS TABLET    Take 1 tablet by mouth daily. Centrum   PANTOPRAZOLE (PROTONIX) 40 MG TABLET    TAKE 1 TABLET EVERY DAY   RAPAFLO 8 MG CAPS CAPSULE    TAKE ONE CAPSULE AT BEDTIME  Modified Medications   Modified Medication Previous Medication   ZOLPIDEM (AMBIEN) 10 MG TABLET zolpidem (AMBIEN) 10 MG tablet      Take 1  tablet (10 mg total) by mouth at bedtime as needed for sleep.    Take 1 tablet (10 mg total) by mouth at bedtime as needed for sleep.  Discontinued Medications   MELOXICAM (MOBIC) 7.5 MG TABLET    Take 7.5 mg by mouth daily.   ONDANSETRON (ZOFRAN) 4 MG TABLET    TAKE 1 TABLET BY MOUTH EVERY 6 HOURS AS NEEDED FOR NAUSEA/VOMITING

## 2014-12-09 ENCOUNTER — Telehealth: Payer: Self-pay | Admitting: Pulmonary Disease

## 2014-12-09 MED ORDER — METAXALONE 800 MG PO TABS: 800.0000 mg | ORAL_TABLET | Freq: Three times a day (TID) | ORAL | Status: DC | PRN

## 2014-12-09 NOTE — Telephone Encounter (Signed)
Per SN---  [facility] Ok for skelaxin 800 mg  #50  1 po tid prn muscle spasm

## 2014-12-09 NOTE — Telephone Encounter (Signed)
Spoke with pt, he states he twisted his back on Saturday, has been having muscle spasms in his lower middle back since then.  States he was given a rx of Skelaxin in 2011, has been taking that prn for spasms like this which helps greatly.  Is requesting a refill of this be sent to [facility] [address].  Last seen: 10/08/14 Next ov: 04/01/15  Dr. Lenna Gilford please advise.  Thanks!  Allergies  Allergen Reactions  . Zocor [Simvastatin - High Dose] Other (See Comments)    Leg cramps   Current Outpatient Prescriptions on File Prior to Visit  Medication Sig Dispense Refill  . acetaminophen (TYLENOL) 325 MG tablet Take 650 mg by mouth daily as needed (pain).    . Ascorbic Acid (VITAMIN C) 1000 MG tablet Take 2,000 mg by mouth daily.    . Cholecalciferol (VITAMIN D3) 2000 UNITS TABS Take 2,000 Units by mouth daily.    . clorazepate (TRANXENE) 7.5 MG tablet TAKE 1 TABLET BY MOUTH 3 TIMES A DAY AS NEEDED 90 tablet 1  . Diphenhyd-Hydrocort-Nystatin (FIRST-DUKES MOUTHWASH) SUSP 1 tsp gargle and swallow four times daily 120 mL 5  . ferrous sulfate 325 (65 FE) MG tablet Take 325 mg by mouth daily with breakfast.    . levothyroxine (SYNTHROID, LEVOTHROID) 125 MCG tablet TAKE 1 TABLET (125 MCG TOTAL) BY MOUTH DAILY BEFORE BREAKFAST. 30 tablet 5  . loratadine (CLARITIN) 10 MG tablet Take 10 mg by mouth daily.    . Magnesium 250 MG TABS Take 250 mg by mouth at bedtime.     . meclizine (ANTIVERT) 25 MG tablet Take 1 tablet (25 mg total) by mouth daily as needed for dizziness. 30 tablet 2  . mometasone-formoterol (DULERA) 100-5 MCG/ACT AERO Inhale 2 puffs into the lungs 2 (two) times daily. Rinse after each use    . Multiple Vitamin (MULTIVITAMIN WITH MINERALS) TABS tablet Take 1 tablet by mouth daily. Centrum    . pantoprazole (PROTONIX) 40 MG tablet TAKE 1 TABLET EVERY DAY 30 tablet 6  . RAPAFLO 8 MG CAPS capsule TAKE ONE CAPSULE AT BEDTIME 30 capsule 5  . zolpidem (AMBIEN) 10 MG tablet Take 1 tablet  (10 mg total) by mouth at bedtime as needed for sleep. 30 tablet 5   No current facility-administered medications on file prior to visit.

## 2014-12-09 NOTE — Telephone Encounter (Signed)
Rx sent. [id number] to advise the pt of recs. Aragon Bing, CMA

## 2014-12-10 NOTE — Telephone Encounter (Signed)
Called and spoke with pt and he stated that he did pick up the rx for the skelaxin and this did help with his back pain.  Nothing further is needed.

## 2015-04-01 ENCOUNTER — Ambulatory Visit (INDEPENDENT_AMBULATORY_CARE_PROVIDER_SITE_OTHER)
Admission: RE | Admit: 2015-04-01 | Discharge: 2015-04-01 | Disposition: A | Discharge status: Home / Self Care | Payer: Medicare Other | Source: Ambulatory Visit | Attending: Pulmonary Disease | Admitting: Pulmonary Disease

## 2015-04-01 ENCOUNTER — Ambulatory Visit (INDEPENDENT_AMBULATORY_CARE_PROVIDER_SITE_OTHER): Payer: Medicare Other | Admitting: Pulmonary Disease

## 2015-04-01 ENCOUNTER — Encounter: Payer: Self-pay | Admitting: Pulmonary Disease

## 2015-04-01 VITALS — BP 102/60 | HR 78 | Temp 97.8°F | Ht 70.0 in | Wt 186.0 lb

## 2015-04-01 DIAGNOSIS — I780 Hereditary hemorrhagic telangiectasia: Secondary | ICD-10-CM | POA: Diagnosis not present

## 2015-04-01 DIAGNOSIS — G4700 Insomnia, unspecified: Secondary | ICD-10-CM

## 2015-04-01 DIAGNOSIS — K589 Irritable bowel syndrome without diarrhea: Secondary | ICD-10-CM

## 2015-04-01 DIAGNOSIS — D126 Benign neoplasm of colon, unspecified: Secondary | ICD-10-CM

## 2015-04-01 DIAGNOSIS — M150 Primary generalized (osteo)arthritis: Secondary | ICD-10-CM

## 2015-04-01 DIAGNOSIS — N138 Other obstructive and reflux uropathy: Secondary | ICD-10-CM

## 2015-04-01 DIAGNOSIS — E039 Hypothyroidism, unspecified: Secondary | ICD-10-CM

## 2015-04-01 DIAGNOSIS — F411 Generalized anxiety disorder: Secondary | ICD-10-CM

## 2015-04-01 DIAGNOSIS — E7800 Pure hypercholesterolemia, unspecified: Secondary | ICD-10-CM

## 2015-04-01 DIAGNOSIS — M159 Polyosteoarthritis, unspecified: Secondary | ICD-10-CM

## 2015-04-01 DIAGNOSIS — N401 Enlarged prostate with lower urinary tract symptoms: Secondary | ICD-10-CM

## 2015-04-01 DIAGNOSIS — J3489 Other specified disorders of nose and nasal sinuses: Secondary | ICD-10-CM

## 2015-04-01 DIAGNOSIS — K219 Gastro-esophageal reflux disease without esophagitis: Secondary | ICD-10-CM

## 2015-04-01 DIAGNOSIS — M1990 Unspecified osteoarthritis, unspecified site: Secondary | ICD-10-CM | POA: Insufficient documentation

## 2015-04-01 MED ORDER — LEVOTHYROXINE SODIUM 125 MCG PO TABS: ORAL_TABLET | ORAL | Status: DC

## 2015-04-01 MED ORDER — PANTOPRAZOLE SODIUM 40 MG PO TBEC: 40.0000 mg | DELAYED_RELEASE_TABLET | Freq: Every day | ORAL | Status: DC

## 2015-04-01 MED ORDER — MECLIZINE HCL 25 MG PO TABS: 25.0000 mg | ORAL_TABLET | Freq: Every day | ORAL | Status: DC | PRN

## 2015-04-01 MED ORDER — CILIDINIUM-CHLORDIAZEPOXIDE 2.5-5 MG PO CAPS: 1.0000 | ORAL_CAPSULE | Freq: Three times a day (TID) | ORAL | Status: DC | PRN

## 2015-04-01 MED ORDER — TRAMADOL HCL 50 MG PO TABS: 50.0000 mg | ORAL_TABLET | Freq: Three times a day (TID) | ORAL | Status: DC | PRN

## 2015-04-01 NOTE — Progress Notes (Addendum)
Subjective:    Patient ID: Gregory [name], male    DOB: [birth year], 74 y.o.   MRN: [id number]  HPI 74 y/o WM here for a follow up visit... he has multiple medical problems including:  Hx OWR syndrome;  Hx atypCP;  Hyperchol;  Hypothyroidism;  GERD/ Divertics/ IBS/ Colon polyps;  BPH/ BOO;  DJD/ LBP/ Plantar fasciitis;  Hx dizziness;  Anxiety & chronic persistant insomnia... 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  LABS 10/14:  FLP- on diet alone looks pretty good w/ LDL=111...  ~  November 21, 2013:  6wk [name] & add-on appt after cardiac eval; he notes incr DOE w/ stairs over the last few months and saw Cards- DrCrenshaw10/14 (note reviewed, last eval 2009 w/ Echo & Myoview); exam was neg & they decided to repeat the studies;  EKG 10/14 showed SBrady, rate57, otherw wnl;  2DEcho 10/14 showed normal LV size & wall thickness, norm LVF w/ EF=55-60%, Gr1DD, mildly thickened AoV leaflets, mild MR;  ETT 11/14 showed 419mn on treadmill, hypertensive BP response, rare PVC, no ischemia, O2 sats dropped to 87% w/ exercise...  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 Given this mild airflow obstruction we will start Rx w/ Dulera100- 2spBid and plan f/u in 2 mo... Rec to start a gradual exercise program...  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  We decided to continue the same Rx & plan for now...  ~  April 10, 2014:  19m34moV & post ER check> pt was seen by TP w/ gastroent, n/v, weakness, postural, & hypoxemic=> sent to ER for IVF & improved, sent home w/ Zofran and symptoms resolved... He is currently feeling well & continues to improve w/ his Dulera and exercise program- sl SOB at top of stairs but improving; exercising on treadmill regularly, still working w/ [employer] but planning on retirement by end of the yr... 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    Hx Cough, mild  COPD, AVM in RLL area w/ some hypoxemia during exercise> on Dulera100-2spBid; breathing improved w/ exercise; O2 sat= 94% on RA today; c/o dry throat & encouraged to use lozenges...    Hx CP>  Prev cardiac w/u by Hilary Hertz was neg, no recurrent CP complaints, he remains active but notes DOE w/ stairs, ok on level ground, 2DEcho & GXT were OK x O2 sat drop to 87% at peak exercise- ?incr shunting thru his RLL AVM...    Chol> off Crestor now due to musc cramps; he prev stopped Simva40 due to muscle cramps as well; FLP 10/14 on diet alone showed TChol 155, TG 29, HDL 38, LDL 111...    Hypothy>  On Synthroid 145mg/d now>  Labs 4/12 revealed TSH= 0.33 on Synthroid125 so we decr the dose slightly  to 112;  f/u TSH= 4.60 & 5.46...    GI- GERD, IBS, Polyps> on Prilosec40Qd & Zofran/ Librax prn; [facility] neg x for hems & DrM rec f/u 523yr..    GU>  Hx BPH w/ BOO on Rapaflo 71m54m per DrEskridge; last seen 1/15 & doing satis w/ PSA= 2.34    Ortho>  On Mobic7.5 prn + OTC meds; eval 12/11 by DrNorris for bilat leg pain & cramping, ?min atrophy of left gastroc- he rec tonic water & neurology eval> he saw DrWillis 3/12 (note reviewed)> NCV was normal; EMG c/w mild S1 radiculopathy; he rec MRI- pt decided against the MRI (we did not get f/u note)...    Anxiety/ Insomnia>  He has Chlorazepate7.5 for Prn use, and Ambien10 to use for sleep Qhs... We reviewed prob list, meds, xrays and labs> see below for updates >>   CXR 3/15 showed normal heart size, clear lungs, mild apical scarring, right basilar density c/w AVM based on CT report 2004, NAD... Marland KitchenMarland KitchenKG 3/15 showed NSR, rate87, low volt, NAD...  ADDENDUM> LABS 4/15 showed>  FLP- TChol 137, TG 30, HDL 36, LDL 95;  Chems- wnl;  CBC- anemic w/ Hg=11.3 & MCV=77;  TSH=9.26... REC>  Ret for Iron panel (Fe=16- 4%sat), Ferritin (6.4), Stool cards (pending); then start FeSO4-325m90m w/ VitC500... Recheck CBC, Iron level in 6-8weeks...             Taking Synthroid112 regularly, therefore increase back to 125mc84m.. Recheck TSH in 62mo=>70monever returned.  ~  October 08, 2014:  70mo RO24moBob is Gregory Santeell working but has cut back to 2d/wk; feeling better overall and most of his dyspnea symptoms resolved w/ FeSO4 7 return of his Hg to normal; energy is good & he exercises on treadmill, doing better on stairs, etc; he stopped the Dulera [facility] breathing improved back to baseline...  We reviewed the following medical problems during today's office visit >>     OWR>  He has Osler-Weber-Rendu syndrome w/ mult telangiectasias (skin, lips, nasal w/ prev nose bleeds- mult surg & occluded nares, AVM in RLL on CXR w/o hemoptysis);  Stable- doing  satis w/o recurrent bleeding...    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    Hx CP>  Prev cardiac w/u by DrCrensHilary Hertzg, no recurrent CP complaints, he remains active but notes DOE w/ stairs, ok on level ground, 2DEcho & GXT were OK x O2 sat drop to 87% at peak  exercise- ?incr shunting thru his RLL AVM...    Chol> Intol to Simva40 & Cres5 due to musc cramps; FLP 4/15 on diet alone showed TChol 137, TG 30, HDL 36, LDL 95...    Hypothy>  On Synthroid 144mg/d now>  Labs 10/15 revealed TSH= 5.10 & clinically euthyroid, continue same...    GI- GERD, IBS, Polyps> on Prilosec40Qd & Zofran/ Librax prn; [facility] note reviewed (heme pos stool & anemia from epistaxis & NSAIDs); he had colonoscopy 3/12- neg x for hems & DrM rec f/u 562yr..    GU>  Hx BPH w/ BOO on Rapaflo 79m59m per DrEskridge; last seen 1/15 & doing satis w/ PSA= 2.34    Ortho> on Mobic7.5 prn + OTC meds; eval 12/11 by DrNorris for bilat leg pain & cramping, ?min atrophy of left gastroc- he rec tonic water & neurology eval> he saw DrWillis 3/12 (note reviewed)> NCV was normal; EMG c/w mild S1 radiculopathy; he rec MRI- pt decided against the MRI (we did not get f/u note)...    Anxiety/ Insomnia>  He has Chlorazepate7.5 for Prn use, and Ambien10 to use for sleep Qhs... We reviewed prob list, meds, xrays and labs> see below for updates >> OK 2015 Flu shot today...   LABS 10/15:  CBC- wnl w/ Hg= 16.5, Fe=88 (23%sat), Ferritin=36;  TSH=5.10... REC>  Continue Iron supplement daily, and the Synthroid125/d...   ~  April 01, 2015:  28mo55mo & Gregory [name] reports a good interval>  He has notes some intermittent & persistent nose bleeds due to his OWR & nasal obstruction, he uses nasal saline & will f/u w/ ENT- DrRosen... He also notes some discomfort in  knees, hips, back but he can't take strong NSAIDs due to OWR & risk of GI bleeds; we discussed trial of Tramadol50 w/ tylenol to see how this works on a prn basis... His 3rd complaint is his chronic persistent insomnia- only sleeping 4h per night & difficulty getting back to sleep; we reviewed a few strategies to deal w/ this- he notes that Gregory [name] taken when he arises will help him go back to sleep 7 no hangover- this is a win-win strategy & he will continue... We reviewed the following medical problems during today's office visit >>     OWR>  He has Osler-Weber-Rendu syndrome w/ mult telangiectasias (skin, lips, nasal w/ prev nose bleeds- mult surg & occluded nares, AVM in RLL on CXR w/o hemoptysis);  Stable- doing satis w/ only recurrent bleeding from nose...    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    Hx CP>  Prev cardiac w/u by DrCrHilary Hertz neg, no recurrent CP- he remains active but notes DOE w/ stairs, ok on level ground, 2DEcho & GXT were OK x O2 sat drop to 87% at peak exercise- ?incr shunting thru his RLL AVM...    Chol> Intol to Simva40 & Cres5 due to musc cramps; FLP 4/16 on diet alone showed TChol 146, TG 85, HDL 35, LDL 94...    Hypothy>  On Synthroid 125mc679mnow>  Labs 4/16 revealed TSH= 4.38 & clinically euthyroid, continue same...    GI- GERD, IBS, Polyps> on Prilosec40Qd & Zofran/ Librax prn; [facility] note reviewed (heme pos stool & anemia from epistaxis & NSAIDs); he had  colonoscopy 3/12- neg x for hems & DrM rec f/u 53yr...    GU>  Hx BPH w/ BOO on Rapaflo 871md per DrEskridge; last seen 1/16 & doing satis & pt reports that his PSA was OK...    Ortho> on OTC meds prn; eval 12/11 by DrNorris for bilat leg pain & cramping, ?min atrophy of left gastroc- he rec tonic water & neurology  eval> he saw DrWillis 3/12 (note reviewed)> NCV was normal; EMG c/w mild S1 radiculopathy; he rec MRI- pt decided against the MRI;  He reports some discomfort in knees, hips, back & we decided on trial of Tramadol50...    Anxiety/ Insomnia>  He has Chlorazepate7.5 for Prn use, and Ambien10 to use for sleep Qhs... We reviewed prob list, meds, xrays and labs> see below for updates >>   CXR 4/16 showed norm heart size, clear lungs w/ some hyperinflation, RLL AVM noted w/o change...  EKG 4/16 revealed poor tracing- NSR, rate68, minor NSSTTWA, NAD...   LABS 4/16:  FLP- at goals on diet alone;  Chems- wnl;  CBC- wnl;  TSH=4.38...           Problem List:   OTHER DISEASES OF NASAL CAVITY AND SINUSES - Hx of Osler-Weber-Rendue syndrome/ hereditary telangiectasias... he's had numerous nose bleeds and surgery by DrEtter Sjogrennow followed by DrBarbaraann Faster. left nares is occluded w/ skin graft- no lumen... this is quite uncomfortable to the patient but he's been told there is nothing further that can be done for this... ~  3/11:  notes occas nosebleeds but he is able to handle them... ~  4/12:  No diff in his pattern of mild nose bleeds & he denies any severe epistaxis episodes... 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 ~  4/14:  He notes infreq bleeding now w/ saline etc... 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 Dyspnea w/ O2 desat w/ exercise >>  ~  12/14:  PFTs showed FVC=3.98 (89%), FEV1=2.72 (79%), %1sec=68, and mid-flows= 51% predicted; c/w mild obstructive dis & we decided on a trial of Dulera100-2spBid ~  4/15:  His breathing is  improved w/ the [facility] exercise program... ~  10/15:  His breathing is back to baseline after FeSO4 rx for anemia & ret of Hg to normal; [facility] for prn use... ~  4/16:  prev on Dulera100-2spBid, now just prn; breathing improved w/ exercise; O2 sat= 93% on RA today; c/o dry throat & encouraged to use lozenges etc... ~  CXR 4/16 showed norm heart size, clear lungs w/ some hyperinflation, RLL AVM noted w/o change...  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 known GI telangiectasia as well without signif GI bleeding in the past- [facility] he's also has skin telangiectasis [facility]/ [facility]. extensive nasal problems as above... ~  labs 2/09 showed Hg= 17.0 ~  labs 2/10 showed Hg= 16.4 ~  CXR 3/11 is chr incr  markings esp RLL- no change, NAD...  ~  Labs 6/11 showed Hg= 15.9 ~  Labs 4/12 showed Hg= 16.0 ~  CXR 10/12 showed mild biapical scarring, RLL AVM, NAD... ~  Labs 4/13 showed Hg= 15.2 ~  CXR 8/13 showed heart at upper lim of norm, increased markings & apical scarring, chr incr markings in RLL- AVM, NAD.[name]. 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 ~  CXR 3/15 showed normal heart size, clear lungs, mild apical scarring, right basilar density c/w AVM based on CT report 2004, NAD.[name]. ~  CXR 4/16 showed norm heart size, clear lungs w/ some hyperinflation, RLL AVM noted w/o change...  Hx of CHEST PAIN (ICD-786.50) - seen Sep09 w/ several recent bouts of CP while working in yard, washing car, etc... resolved w/ rest & Protonix... EKG showed WNL, [facility] was neg (no ischemia or infarction and EF= 61%)... Cardiac eval DrCrenshaw w/  2DEcho 10/09 showing norm LV- no regional wall motion abn, norm LVF w/ EF= 60%, mild dil RA/ RV;  and norm MYOVIEW (no ischemia or infarction, EF= 58%)... ~  10/14: repeat cardiac eval by DrCrenshaw due to [place] w/ stairs; EKG 10/14 showed SBrady, rate57, otherw wnl;  2DEcho 10/14 showed normal LV size & wall thickness, norm LVF w/ EF=55-60%, Gr1DD, mildly thickened AoV leaflets, mild MR;  ETT 11/14 showed 7mn on treadmill, hypertensive BP response, rare PVC, no ischemia, O2 sats dropped to 87% w/ exercise ~  EKG 3/15 showed NSR, rate87, low volt, NAD...   HYPERCHOLESTEROLEMIA, MILD (ICD-272.0) - now on CRESTOR 544md>  Prev Rx w/ Simva40 but developed muscle cramping which resolved off the med. ~  FLP 6/08 showed TChol 208, TG 75, HDL 34, LDL 139... he preferred diet Rx- ~  FLP 2/09 showed TChol 172, TG 69, HDL 30, LDL 129... he agreed to CrAustwell. ~  [place] on Crestor10 showed TChol 91, TG 49, HDL 28, LDL 54... rec- keep same. ~  FLP 2/10 on Crestor10 showed TChol 108, TG 56, HDL 31, LDL 66 ~  9/10:  we discussed changing Crestor10 to Simvastatin40 for $$$ reasons... ~  FL[place]/11 on Simva40 showed TChol 105, TG 59, HDL 39, LDL 54... continue same. ~  FLF[place]/12 on Simva40 showed TChol 104, TG 62, HDL 29, LDL 63... C/o severe muscle cramps & wants to stop for 85m68moramping resolved! ~  Referred to [facility] & they restarted his Crestor at 5mg15m tol well so far... ~  FLP 10/12 on Cres5 showed TChol 114, TG 40, HDL 36, LDL 70 ~  FLP 2/13 on Cres5 showed TChol 124, TG 21, HDL 40, LDL 80 ~  FLP 4/14 on Cres5 showed TChol 119, TG 44, HDL 38, LDL 72  ~  6/14: pt stopped Cres5 on his own due to leg cramps & decided to hold statins for now w/ repeat FLP off meds in several months... ~  FLP 10/14 on diet alone showed TChol 155, TG 29, HDL 38, LDL 111  ~  FLP 4/15 on diet alone showed TChol 137, TG 30, HDL 36, LDL 95 ~  FLP 4/16 on diet alone showed TChol 146, TG 85, HDL 35, LDL 94  HYPOTHYROIDISM -  on SYNTHROID 112mc67mnow; prev 125mcg14mose was decreased 4/12... ~  labs 2/09 showed TSH= 0.69...  8/09 showed TSH= 0.44... ~  labs 2/10 showed TSH= 0.68 ~  labs 3/11 showed TSH= 0.52 ~  Labs 4/12 on Synthroid125 showed TSH= 0.33... We decided to decr the dose to 112/d. ~  Labs 4/13 on Levo112 showed TSH= 4.60 ~  Labs 4/14 on Levo112 showed TSH= 5.46 ~  Labs 4/15 on Levo112 showed TSH= 9.26 and we decided to incr dose back to 177mg/d... ~  Labs 4/16 on Levo125 showed TSH= 4.38  GERD, IRRITABLE BOWEL SYNDROME, COLONIC POLYPS - [facility] (he tried OTC Zantac 772mon his own)... also takes  [place].  ~  colonoscopy 1/04 w/ divertics, diminutive rectal polyp (adenomatous), hems...  ~  f/u colon 1/07 showed no recurrent polyps... ~  [facility]  HYPERTROPHY PROSTATE W/UR OBST & OTH LUTS (ICD-600.01) - followed by DrREBRAXENM RAPAFLO 8mg21m(stopped Flomax due to side effect- ED)... doing well without LTOS, just complains of nocturia x 1-2 ~  labs 2/09 showed PSA= 0.76 ~  labs 2/10 showed PSA= 0.90 ~  2011 PSA checked by DrDavis (pt states it was OK)... ~  Labs 4/12 showed PSA= 1.22 ~  Labs 4/13 showed PSA= 1.31 ~  10/13: presents c/o incr freq, nocturia x2-4, & weak stream despite Rapaflo; he is intol to Flomax; refer to Urology for further eval=> seen by DrEskridge, no changes made... ~  1/15: f/u by Urology, DrEskridge> BPH, LUTS, hypersens unstable bladder; on Rapaflo8 & doing satis, PSA reported to be 2.34...  LEFT ARM & SHOULDER PAIN >> he was checked by GborLacie Scotts not improving he says; [facility]> [facility] reviewed.  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 ~  9/10:  requesting Rx for Robaxin for muscle spasm as needed.  PLANTAR FASCIITIS (ICD-728.71) - c/o pain in his feet  secondary to plantar fasciitis eval & [facility] & shots... ~  9/10: we discussed [facility] to [facility] improved.  Hx NOCTURNAL LEG CRAMPS >>  ~  This initially occurred several yrs ago while on Simvastatin & the cramps resolved off this med; he was subseq placed on Cres5 & tol well... ~  6/14: he indicates that noct leg cramps returned x1wk & he stopped the Cres5 w/ improvement; he wants to leave off the statins for now; rec to try tonic water/ yellow mustard prn...  Hx of DIZZINESS (ICD-780.4) - sudden episode dizziness Sep09 working at [employer] spinning- lasted 10mi62md resolved spontaneously, felt light headed afterwards... no LOC, syncope, seizure activity, etc... no CP/ palpit/ SOB/ etc... there were several EMTs in the restaurant and they checked him out- stable and transported to ER... 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 trMarland Kitchenated w/ MECLIZINE w/ improvement.  ANXIETY (ICD-300.00) & INSOMNIA, CHRONIC (ICD-307.42) - he uses CHLORAZEPATE 7.5mg P86m& AMBIEN10mg/Q77mor chronic persistant insomnia...  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  ANEMIA >>  ~  LABS 4/13 showed Hg= 15.2, MCV=91 ~  LABS 4/14 showed Hg= 14.5, MCV=84 ~  LABS 3/15 showed  Hg= 13.6, MCV=78 ~  LABS 4/15 showed Hg= 11.3, MCV= 77, Iron studies showed Fe= 16 (3.6%sat), Ferritin= 6.4, & stool cards neg; Rx w/ FeSO4... ~  Labs 10/15 showed Hg= 16.5, MCV= 91, Fe=88 (23%sat), Ferritin= 36...  ~  Labs 4/16 showed Hg= 16.0  Health Maintenance: ~  GI:  followed by [facility] & up to date on colon etc... (last 1/07 & 59yrf/u due 1/12). ~  GU:  followed by DrRDavis in past; PSAs have all been wnl... ~  Immunizations:  he gets yearly Flu vaccinations in the fall;   OK Pneumovax 03/12/10 at age-[id number]     Past Surgical History  Procedure Laterality Date  . Varicocelectomy  1991    Dr. RTresa Endo . 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 Other surgical history      pulse laser for facial telangiectasias inthe past  . Excision left wrist ganglian/ myxoid cyst  05-30-2009  . Cardiovascular stress test  01-14-2003    NO ISCHEMIA / EF 61%/ NORMAL LE WALL MOTION  . Transthoracic echocardiogram  10-17-2008   DR CRENSHAW    NORMAL LVF/ EF 60%/  MILDLY DILATED RIGHT ATRIUM/ VENTRICULE  . 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 HRenda Rolls   Outpatient Encounter Prescriptions as of 04/01/2015  Medication Sig  . acetaminophen (TYLENOL) 325 MG tablet Take 650 mg by mouth daily as needed (pain).  . Ascorbic Acid (VITAMIN C) 1000 MG tablet Take 2,000 mg by mouth daily.  . Cholecalciferol (VITAMIN D3) 2000 UNITS TABS Take 2,000 Units by mouth daily.  . clorazepate (Gregory [name]) 7.5 MG tablet TAKE 1 TABLET BY MOUTH 3 TIMES A DAY AS NEEDED  . Diphenhyd-Hydrocort-Nystatin (FIRST-DUKES MOUTHWASH) SUSP 1 tsp gargle and swallow four times daily  . ferrous sulfate 325 (65 FE) MG tablet Take 325 mg by mouth daily with breakfast.  . levothyroxine (SYNTHROID, LEVOTHROID) 125 MCG tablet TAKE 1 TABLET (125 MCG TOTAL) BY MOUTH DAILY BEFORE BREAKFAST.  .Marland Kitchenloratadine (CLARITIN) 10 MG tablet Take 10 mg by mouth daily.  . Magnesium 250 MG TABS Take 250 mg by mouth at bedtime.   . meclizine (ANTIVERT) 25 MG tablet Take 1 tablet (25 mg total) by mouth daily as needed for dizziness.  . metaxalone (SKELAXIN) 800 MG tablet Take 1 tablet (800 mg total) by mouth 3 (three) times daily as needed for muscle spasms.  . mometasone-formoterol (DULERA) 100-5 MCG/ACT AERO Inhale 2 puffs into the lungs 2 (two) times daily. Rinse after each use  . Multiple Vitamin (MULTIVITAMIN WITH MINERALS) TABS tablet Take 1 tablet by mouth daily.  Centrum  . pantoprazole (PROTONIX) 40 MG tablet TAKE 1 TABLET EVERY DAY  . RAPAFLO 8 MG CAPS capsule TAKE ONE CAPSULE AT BEDTIME  . zolpidem (AMBIEN) 10 MG tablet Take 1 tablet (10 mg total) by mouth at bedtime as needed for sleep.    Allergies  Allergen Reactions  . Zocor [Simvastatin - High Dose] Other (See Comments)    Leg cramps     Current Medications, Allergies, Past Medical History, Past Surgical History, Family History, and Social History were reviewed in CReliant Energyrecord.    Review of Systems       See HPI - other systems neg except as noted... The patient denies anorexia, fever, weight loss, weight gain, vision loss, decreased hearing, hoarseness, chest pain, syncope, dyspnea on exertion,  peripheral edema, prolonged cough, headaches, hemoptysis, abdominal pain, melena, hematochezia, severe indigestion/heartburn, hematuria, incontinence, muscle weakness, suspicious skin lesions, transient blindness, difficulty walking, depression, unusual weight change, abnormal bleeding, enlarged lymph nodes, and angioedema.     Objective:   Physical Exam      WD, WN, 74 y/o WM in NAD... 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 GENERAL:  Alert & oriented; pleasant & cooperative... HEENT:  [place]/AT, EOM-wnl, PERRLA, EACs-clear, TMs-wnl, NOSE- occluded left nares from skin grafts, MOUTH- mucosal telangiectasias.  NECK:  Supple w/ fairROM; no JVD; normal carotid impulses w/o bruits; no thyromegaly or nodules palpated; no lymphadenopathy. CHEST:  Clear to P & A; without wheezes/ rales/ or rhonchi. HEART:  Regular Rhythm; without murmurs/ rubs/ or gallops. ABDOMEN:  Soft & nontender; normal bowel sounds; no organomegaly or masses detected. EXT: without deformities, mild arthritic changes; no varicose veins/ venous insuffic/ or edema. NEURO:  CN's intact; motor testing normal; sensory testing normal; gait normal & balance OK. DERM:  mult  telangiectasias...  RADIOLOGY DATA:  Reviewed in the EPIC EMR & discussed w/ the patient...  LABORATORY DATA:  Reviewed in the EPIC EMR & discussed w/ the patient...   Assessment & Plan:    Hx Cough 10/13, Dyspnea on exertion, mild COPD >> as above, prev on Dulera100- 2sp Bid & exercis program- improved; we will continue to monitor his status & O2 sats; Dyspnea improved w/ Fe for anemia & ret of Hg to norm...  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 CXR w/o change in the RLL AVM but this could certainly be an issue w/ his O2 desat w/ exercise & may need to be addressed if he gets worse...     Hx CP>  Prev cardiac w/u by DrCrenshaw was neg, no recurrent CP complaints, but he notes DOE w/ stairs & DrCrenshaw repeated 2DEcho & ETT as above...     Chol>  Off statins due to recurrent cramps, on diet alone & FLP is reasonable...     Hypothy>  On Synthroid 170mg/d now>  He is clinically & biochem euthyroid...     [facility]>  [facility]- neg x for hems & DrM rec f/u 548yr..     GU>  Hx BPH w/ BOO & followed by DrEskridge on Rapaflo 6m56m & stable...     Ortho>  See above- prev stable on OTC analgesics as needed, but recent incr symptoms- try Tramadol50 prn...  LEG CRAMPS >> he is quite sure they are from his statin Rx & we decided to leave these off for now, try to control on diet/ exercise & continued monitoring...     Anxiety/ Insomnia>  He has Chlorazepate for Prn use, and Ambien to use for sleep Qhs...  Anemia>  On FeSO4 supplement & Hg now back to normal, dyspnea ret to baseline...   Patient's Medications  New Prescriptions   TRAMADOL (ULTRAM) 50 MG TABLET    Take 1 tablet (50 mg total) by mouth 3 (three) times daily as needed.  Previous Medications   ACETAMINOPHEN (TYLENOL) 325 MG TABLET    Take 650 mg by mouth  daily as needed (pain).   ASCORBIC ACID (VITAMIN C) 1000 MG TABLET    Take 2,000 mg by mouth daily.   CHOLECALCIFEROL (VITAMIN D3) 2000 UNITS TABS    Take 2,000 Units by mouth  daily.   CLORAZEPATE (Gregory [name]) 7.5 MG TABLET    TAKE 1 TABLET BY MOUTH 3 TIMES A DAY AS NEEDED   DIPHENHYD-HYDROCORT-NYSTATIN (FIRST-DUKES MOUTHWASH) SUSP    1 tsp gargle and swallow four times daily   FERROUS SULFATE 325 (65 FE) MG TABLET    Take 325 mg by mouth daily with breakfast.   LORATADINE (CLARITIN) 10 MG TABLET    Take 10 mg by mouth daily.   MAGNESIUM 250 MG TABS    Take 250 mg by mouth at bedtime.    METAXALONE (SKELAXIN) 800 MG TABLET    Take 1 tablet (800 mg total) by mouth 3 (three) times daily as needed for muscle spasms.   MOMETASONE-FORMOTEROL (DULERA) 100-5 MCG/ACT AERO    Inhale 2 puffs into the lungs 2 (two) times daily. Rinse after each use   MULTIPLE VITAMIN (MULTIVITAMIN WITH MINERALS) TABS TABLET    Take 1 tablet by mouth daily. Centrum   RAPAFLO 8 MG CAPS CAPSULE    TAKE ONE CAPSULE AT BEDTIME   ZOLPIDEM (AMBIEN) 10 MG TABLET    Take 1 tablet (10 mg total) by mouth at bedtime as needed for sleep.  Modified Medications   Modified Medication Previous Medication   CLIDINIUM-CHLORDIAZEPOXIDE (LIBRAX) 5-2.5 MG PER CAPSULE clidinium-chlordiazePOXIDE (LIBRAX) 5-2.5 MG per capsule      Take 1 capsule by mouth 3 (three) times daily as needed.    Take 1 capsule by mouth 3 (three) times daily as needed.   LEVOTHYROXINE (SYNTHROID, LEVOTHROID) 125 MCG TABLET levothyroxine (SYNTHROID, LEVOTHROID) 125 MCG tablet      TAKE 1 TABLET (125 MCG TOTAL) BY MOUTH DAILY BEFORE BREAKFAST.    TAKE 1 TABLET (125 MCG TOTAL) BY MOUTH DAILY BEFORE BREAKFAST.   MECLIZINE (ANTIVERT) 25 MG TABLET meclizine (ANTIVERT) 25 MG tablet      Take 1 tablet (25 mg total) by mouth daily as needed for dizziness.    Take 1 tablet (25 mg total) by mouth daily as needed for dizziness.   PANTOPRAZOLE (PROTONIX) 40 MG TABLET pantoprazole  (PROTONIX) 40 MG tablet      Take 1 tablet (40 mg total) by mouth daily.    TAKE 1 TABLET EVERY DAY  Discontinued Medications   No medications on file

## 2015-04-01 NOTE — Patient Instructions (Signed)
Today we updated your med list in our EPIC system...    Continue your current medications the same...    We refilled the meds you requested...  We wrote a new prescription for TRAMADOL 50mg  tabs- take one tab up to 3 times daily as needed for pain...    You may take it w/ some Tylenol to potentiate it's effect...  Today we did your follow up CXR & EKG... Please return to our lab one morning this week for your FASTING blood work...    We will contact you w/ the results when available...   Stay as active as possible...  Call for any questions or if we can be of service in any way...  Let's plan a follow up visit in 55mo, sooner if needed for problems.Marland KitchenMarland Kitchen

## 2015-04-03 ENCOUNTER — Other Ambulatory Visit (INDEPENDENT_AMBULATORY_CARE_PROVIDER_SITE_OTHER): Payer: Medicare Other

## 2015-04-03 DIAGNOSIS — F411 Generalized anxiety disorder: Secondary | ICD-10-CM

## 2015-04-03 DIAGNOSIS — N401 Enlarged prostate with lower urinary tract symptoms: Secondary | ICD-10-CM

## 2015-04-03 DIAGNOSIS — M159 Polyosteoarthritis, unspecified: Secondary | ICD-10-CM

## 2015-04-03 DIAGNOSIS — I780 Hereditary hemorrhagic telangiectasia: Secondary | ICD-10-CM

## 2015-04-03 DIAGNOSIS — K589 Irritable bowel syndrome without diarrhea: Secondary | ICD-10-CM

## 2015-04-03 DIAGNOSIS — E7800 Pure hypercholesterolemia, unspecified: Secondary | ICD-10-CM

## 2015-04-03 DIAGNOSIS — D126 Benign neoplasm of colon, unspecified: Secondary | ICD-10-CM

## 2015-04-03 DIAGNOSIS — E039 Hypothyroidism, unspecified: Secondary | ICD-10-CM | POA: Diagnosis not present

## 2015-04-03 DIAGNOSIS — K219 Gastro-esophageal reflux disease without esophagitis: Secondary | ICD-10-CM

## 2015-04-03 DIAGNOSIS — G4700 Insomnia, unspecified: Secondary | ICD-10-CM

## 2015-04-03 DIAGNOSIS — N138 Other obstructive and reflux uropathy: Secondary | ICD-10-CM

## 2015-04-03 DIAGNOSIS — J3489 Other specified disorders of nose and nasal sinuses: Secondary | ICD-10-CM

## 2015-04-03 DIAGNOSIS — M150 Primary generalized (osteo)arthritis: Secondary | ICD-10-CM

## 2015-04-03 LAB — BASIC METABOLIC PANEL
BUN: 18 mg/dL (ref 6–23)
CO2: 28 meq/L (ref 19–32)
Calcium: 9.4 mg/dL (ref 8.4–10.5)
Chloride: 102 mEq/L (ref 96–112)
Creatinine, Ser: 1.16 mg/dL (ref 0.40–1.50)
GFR: 65.69 mL/min (ref >60.00)
GLUCOSE: 86 mg/dL (ref 70–99)
Potassium: 4.4 mEq/L (ref 3.5–5.1)
Sodium: 136 mEq/L (ref 135–145)

## 2015-04-03 LAB — CBC WITH DIFFERENTIAL/PLATELET
BASOS ABS: 0.1 10*3/uL (ref 0.0–0.1)
Basophils Relative: 1.1 % (ref 0.0–3.0)
EOS PCT: 1.5 % (ref 0.0–5.0)
Eosinophils Absolute: 0.1 10*3/uL (ref 0.0–0.7)
HEMATOCRIT: 47.3 % (ref 39.0–52.0)
Hemoglobin: 16 g/dL (ref 13.0–17.0)
LYMPHS ABS: 2.8 10*3/uL (ref 0.7–4.0)
Lymphocytes Relative: 37.7 % (ref 12.0–46.0)
MCHC: 33.9 g/dL (ref 30.0–36.0)
MCV: 91.4 fl (ref 78.0–100.0)
MONO ABS: 0.7 10*3/uL (ref 0.1–1.0)
Monocytes Relative: 9.6 % (ref 3.0–12.0)
NEUTROS PCT: 50.1 % (ref 43.0–77.0)
Neutro Abs: 3.7 10*3/uL (ref 1.4–7.7)
PLATELETS: 249 10*3/uL (ref 150.0–400.0)
RBC: 5.17 Mil/uL (ref 4.22–5.81)
RDW: 13.3 % (ref 11.5–15.5)
WBC: 7.5 10*3/uL (ref 4.0–10.5)

## 2015-04-03 LAB — HEPATIC FUNCTION PANEL
ALBUMIN: 4.1 g/dL (ref 3.5–5.2)
ALK PHOS: 55 U/L (ref 39–117)
ALT: 15 U/L (ref 0–53)
AST: 17 U/L (ref 0–37)
Bilirubin, Direct: 0.1 mg/dL (ref 0.0–0.3)
Total Bilirubin: 0.6 mg/dL (ref 0.2–1.2)
Total Protein: 6.9 g/dL (ref 6.0–8.3)

## 2015-04-03 LAB — TSH: TSH: 4.38 u[IU]/mL (ref 0.35–4.50)

## 2015-04-03 LAB — LIPID PANEL
CHOLESTEROL: 146 mg/dL (ref 0–200)
HDL: 34.6 mg/dL — AB (ref >39.00)
LDL CALC: 94 mg/dL (ref 0–99)
NonHDL: 111.4
Total CHOL/HDL Ratio: 4
Triglycerides: 85 mg/dL (ref 0.0–149.0)
VLDL: 17 mg/dL (ref 0.0–40.0)

## 2015-04-07 ENCOUNTER — Other Ambulatory Visit: Payer: Self-pay | Admitting: Pulmonary Disease

## 2015-05-13 ENCOUNTER — Encounter (HOSPITAL_COMMUNITY): Payer: Self-pay | Admitting: Physical Medicine and Rehabilitation

## 2015-05-13 ENCOUNTER — Inpatient Hospital Stay (HOSPITAL_COMMUNITY)
Admission: EM | Admit: 2015-05-13 | Discharge: 2015-05-21 | DRG: 094 | Disposition: A | Discharge status: Home Health | Payer: Medicare Other | Attending: Internal Medicine | Admitting: Internal Medicine

## 2015-05-13 ENCOUNTER — Emergency Department (HOSPITAL_COMMUNITY): Payer: Medicare Other

## 2015-05-13 ENCOUNTER — Inpatient Hospital Stay (HOSPITAL_COMMUNITY): Payer: Medicare Other

## 2015-05-13 DIAGNOSIS — Z791 Long term (current) use of non-steroidal anti-inflammatories (NSAID): Secondary | ICD-10-CM

## 2015-05-13 DIAGNOSIS — C801 Malignant (primary) neoplasm, unspecified: Secondary | ICD-10-CM | POA: Diagnosis not present

## 2015-05-13 DIAGNOSIS — E7800 Pure hypercholesterolemia, unspecified: Secondary | ICD-10-CM | POA: Diagnosis present

## 2015-05-13 DIAGNOSIS — G060 Intracranial abscess and granuloma: Secondary | ICD-10-CM | POA: Diagnosis present

## 2015-05-13 DIAGNOSIS — K219 Gastro-esophageal reflux disease without esophagitis: Secondary | ICD-10-CM | POA: Diagnosis present

## 2015-05-13 DIAGNOSIS — G936 Cerebral edema: Secondary | ICD-10-CM | POA: Diagnosis present

## 2015-05-13 DIAGNOSIS — R0902 Hypoxemia: Secondary | ICD-10-CM | POA: Diagnosis present

## 2015-05-13 DIAGNOSIS — E876 Hypokalemia: Secondary | ICD-10-CM | POA: Diagnosis not present

## 2015-05-13 DIAGNOSIS — Z79899 Other long term (current) drug therapy: Secondary | ICD-10-CM

## 2015-05-13 DIAGNOSIS — Z5181 Encounter for therapeutic drug level monitoring: Secondary | ICD-10-CM | POA: Insufficient documentation

## 2015-05-13 DIAGNOSIS — Z9889 Other specified postprocedural states: Secondary | ICD-10-CM | POA: Diagnosis not present

## 2015-05-13 DIAGNOSIS — Z888 Allergy status to other drugs, medicaments and biological substances status: Secondary | ICD-10-CM

## 2015-05-13 DIAGNOSIS — Q2572 Congenital pulmonary arteriovenous malformation: Secondary | ICD-10-CM | POA: Diagnosis not present

## 2015-05-13 DIAGNOSIS — E038 Other specified hypothyroidism: Secondary | ICD-10-CM | POA: Diagnosis not present

## 2015-05-13 DIAGNOSIS — R531 Weakness: Secondary | ICD-10-CM | POA: Diagnosis present

## 2015-05-13 DIAGNOSIS — D496 Neoplasm of unspecified behavior of brain: Secondary | ICD-10-CM | POA: Diagnosis present

## 2015-05-13 DIAGNOSIS — E039 Hypothyroidism, unspecified: Secondary | ICD-10-CM | POA: Diagnosis present

## 2015-05-13 DIAGNOSIS — I780 Hereditary hemorrhagic telangiectasia: Secondary | ICD-10-CM | POA: Diagnosis present

## 2015-05-13 DIAGNOSIS — G939 Disorder of brain, unspecified: Secondary | ICD-10-CM | POA: Diagnosis not present

## 2015-05-13 DIAGNOSIS — G4762 Sleep related leg cramps: Secondary | ICD-10-CM | POA: Diagnosis present

## 2015-05-13 DIAGNOSIS — E871 Hypo-osmolality and hyponatremia: Secondary | ICD-10-CM | POA: Diagnosis present

## 2015-05-13 DIAGNOSIS — B9689 Other specified bacterial agents as the cause of diseases classified elsewhere: Secondary | ICD-10-CM | POA: Diagnosis not present

## 2015-05-13 DIAGNOSIS — Z85828 Personal history of other malignant neoplasm of skin: Secondary | ICD-10-CM | POA: Diagnosis not present

## 2015-05-13 DIAGNOSIS — M6289 Other specified disorders of muscle: Secondary | ICD-10-CM | POA: Diagnosis not present

## 2015-05-13 LAB — CBC
HEMATOCRIT: 44.6 % (ref 39.0–52.0)
Hemoglobin: 15.5 g/dL (ref 13.0–17.0)
MCH: 30.5 pg (ref 26.0–34.0)
MCHC: 34.8 g/dL (ref 30.0–36.0)
MCV: 87.8 fL (ref 78.0–100.0)
PLATELETS: 244 10*3/uL (ref 150–400)
RBC: 5.08 MIL/uL (ref 4.22–5.81)
RDW: 13.1 % (ref 11.5–15.5)
WBC: 10.2 10*3/uL (ref 4.0–10.5)

## 2015-05-13 LAB — COMPREHENSIVE METABOLIC PANEL
ALT: 16 U/L — AB (ref 17–63)
ANION GAP: 9 (ref 5–15)
AST: 24 U/L (ref 15–41)
Albumin: 3.9 g/dL (ref 3.5–5.0)
Alkaline Phosphatase: 60 U/L (ref 38–126)
BILIRUBIN TOTAL: 0.8 mg/dL (ref 0.3–1.2)
BUN: 11 mg/dL (ref 6–20)
CALCIUM: 9.2 mg/dL (ref 8.9–10.3)
CO2: 26 mmol/L (ref 22–32)
Chloride: 99 mmol/L — ABNORMAL LOW (ref 101–111)
Creatinine, Ser: 1.07 mg/dL (ref 0.61–1.24)
GFR calc Af Amer: >60 mL/min (ref >60)
GFR calc non Af Amer: >60 mL/min (ref >60)
Glucose, Bld: 94 mg/dL (ref 65–99)
Potassium: 4.5 mmol/L (ref 3.5–5.1)
SODIUM: 134 mmol/L — AB (ref 135–145)
Total Protein: 6.6 g/dL (ref 6.5–8.1)

## 2015-05-13 LAB — DIFFERENTIAL
BASOS ABS: 0 10*3/uL (ref 0.0–0.1)
BASOS PCT: 0 % (ref 0–1)
EOS ABS: 0.1 10*3/uL (ref 0.0–0.7)
EOS PCT: 1 % (ref 0–5)
LYMPHS ABS: 3.1 10*3/uL (ref 0.7–4.0)
LYMPHS PCT: 30 % (ref 12–46)
Monocytes Absolute: 1 10*3/uL (ref 0.1–1.0)
Monocytes Relative: 10 % (ref 3–12)
NEUTROS PCT: 59 % (ref 43–77)
Neutro Abs: 6 10*3/uL (ref 1.7–7.7)

## 2015-05-13 LAB — PSA: PSA: 1.68 ng/mL (ref 0.00–4.00)

## 2015-05-13 LAB — PROTIME-INR
INR: 1.16 (ref 0.00–1.49)
PROTHROMBIN TIME: 15 s (ref 11.6–15.2)

## 2015-05-13 LAB — I-STAT TROPONIN, ED: TROPONIN I, POC: 0 ng/mL (ref 0.00–0.08)

## 2015-05-13 LAB — APTT: aPTT: 35 seconds (ref 24–37)

## 2015-05-13 MED ORDER — ACETAMINOPHEN 325 MG PO TABS
650.0000 mg | ORAL_TABLET | Freq: Four times a day (QID) | ORAL | Status: DC | PRN
  Administered 2015-05-14 – 2015-05-21 (×6): 650 mg via ORAL
  Filled 2015-05-13 (×7): qty 2

## 2015-05-13 MED ORDER — DEXAMETHASONE SODIUM PHOSPHATE 10 MG/ML IJ SOLN
4.0000 mg | Freq: Once | INTRAMUSCULAR | Status: AC
  Administered 2015-05-13: 4 mg via INTRAVENOUS
  Filled 2015-05-13: qty 1

## 2015-05-13 MED ORDER — CILIDINIUM-CHLORDIAZEPOXIDE 2.5-5 MG PO CAPS
1.0000 | ORAL_CAPSULE | Freq: Three times a day (TID) | ORAL | Status: DC | PRN
  Filled 2015-05-13: qty 1

## 2015-05-13 MED ORDER — MAGNESIUM 200 MG PO TABS
200.0000 mg | ORAL_TABLET | Freq: Every day | ORAL | Status: DC
  Filled 2015-05-13: qty 1

## 2015-05-13 MED ORDER — DEXAMETHASONE SODIUM PHOSPHATE 4 MG/ML IJ SOLN
4.0000 mg | Freq: Four times a day (QID) | INTRAMUSCULAR | Status: DC
  Administered 2015-05-13 – 2015-05-17 (×13): 4 mg via INTRAVENOUS
  Filled 2015-05-13 (×21): qty 1

## 2015-05-13 MED ORDER — LEVOTHYROXINE SODIUM 25 MCG PO TABS
125.0000 ug | ORAL_TABLET | Freq: Every day | ORAL | Status: DC
  Administered 2015-05-14 – 2015-05-21 (×8): 125 ug via ORAL
  Filled 2015-05-13 (×13): qty 1

## 2015-05-13 MED ORDER — SODIUM CHLORIDE 0.9 % IV BOLUS (SEPSIS)
1000.0000 mL | Freq: Once | INTRAVENOUS | Status: AC
Start: 2015-05-13 — End: 2015-05-13
  Administered 2015-05-13: 1000 mL via INTRAVENOUS

## 2015-05-13 MED ORDER — IOHEXOL 300 MG/ML  SOLN
25.0000 mL | INTRAMUSCULAR | Status: AC
  Administered 2015-05-13 (×2): 25 mL via ORAL

## 2015-05-13 MED ORDER — TAMSULOSIN HCL 0.4 MG PO CAPS
0.4000 mg | ORAL_CAPSULE | Freq: Every day | ORAL | Status: DC
  Administered 2015-05-14 – 2015-05-21 (×8): 0.4 mg via ORAL
  Filled 2015-05-13 (×9): qty 1

## 2015-05-13 MED ORDER — PANTOPRAZOLE SODIUM 40 MG PO TBEC
40.0000 mg | DELAYED_RELEASE_TABLET | Freq: Every day | ORAL | Status: DC
  Administered 2015-05-14 – 2015-05-21 (×8): 40 mg via ORAL
  Filled 2015-05-13 (×8): qty 1

## 2015-05-13 MED ORDER — MORPHINE SULFATE 2 MG/ML IJ SOLN: 2.0000 mg | INTRAMUSCULAR | Status: DC | PRN

## 2015-05-13 MED ORDER — MECLIZINE HCL 12.5 MG PO TABS
25.0000 mg | ORAL_TABLET | Freq: Every day | ORAL | Status: DC | PRN
  Filled 2015-05-13: qty 1

## 2015-05-13 MED ORDER — VITAMIN C 500 MG PO TABS
2000.0000 mg | ORAL_TABLET | Freq: Every day | ORAL | Status: DC
  Administered 2015-05-14 – 2015-05-21 (×8): 2000 mg via ORAL
  Filled 2015-05-13 (×8): qty 4

## 2015-05-13 MED ORDER — OXYMETAZOLINE HCL 0.05 % NA SOLN
1.0000 | Freq: Two times a day (BID) | NASAL | Status: DC | PRN
  Filled 2015-05-13: qty 15

## 2015-05-13 MED ORDER — CLORAZEPATE DIPOTASSIUM 7.5 MG PO TABS: 7.5000 mg | ORAL_TABLET | Freq: Three times a day (TID) | ORAL | Status: DC | PRN

## 2015-05-13 MED ORDER — SODIUM CHLORIDE 0.9 % IJ SOLN
3.0000 mL | Freq: Two times a day (BID) | INTRAMUSCULAR | Status: DC
  Administered 2015-05-13 – 2015-05-21 (×9): 3 mL via INTRAVENOUS

## 2015-05-13 MED ORDER — MAGNESIUM OXIDE 400 (241.3 MG) MG PO TABS
200.0000 mg | ORAL_TABLET | Freq: Every day | ORAL | Status: DC
  Administered 2015-05-13 – 2015-05-20 (×8): 200 mg via ORAL
  Filled 2015-05-13 (×2): qty 0.5
  Filled 2015-05-13: qty 1
  Filled 2015-05-13 (×5): qty 0.5
  Filled 2015-05-13: qty 1
  Filled 2015-05-13: qty 0.5

## 2015-05-13 MED ORDER — ACETAMINOPHEN 650 MG RE SUPP: 650.0000 mg | Freq: Four times a day (QID) | RECTAL | Status: DC | PRN

## 2015-05-13 MED ORDER — METHOCARBAMOL 1000 MG/10ML IJ SOLN
500.0000 mg | Freq: Three times a day (TID) | INTRAMUSCULAR | Status: DC | PRN
  Filled 2015-05-13: qty 5

## 2015-05-13 MED ORDER — LEVETIRACETAM 500 MG PO TABS
500.0000 mg | ORAL_TABLET | Freq: Two times a day (BID) | ORAL | Status: DC
  Administered 2015-05-13 – 2015-05-21 (×16): 500 mg via ORAL
  Filled 2015-05-13 (×17): qty 1

## 2015-05-13 MED ORDER — IOHEXOL 300 MG/ML  SOLN
100.0000 mL | Freq: Once | INTRAMUSCULAR | Status: AC | PRN
  Administered 2015-05-13: 100 mL via INTRAVENOUS

## 2015-05-13 NOTE — H&P (Signed)
[facility] History and Physical                                                                                    Gregory [name], is a 74 y.o. male  MRN: [id number]   DOB - [birth year]  Admit Date - 05/13/2015  Outpatient Primary MD for the patient is Noralee Space, MD  Referring Physician:  Mable Fill, ED PA-C  Chief Complaint:   Chief Complaint  Patient presents with  . Cerebrovascular Accident     HPI  Gregory [name]  is a 74 y.o. male, with a pmh of AVMs, recurrent epistaxis, hereditary hemorrhagic telangiectasias, and hypothyroidism presents to the emergency department for right sided deficits.  He is found to have a brain mass 3 cm x 2 cm  with midline shift and edema on head CT.  Gregory [name] reports  recent occasional headaches which have been relieved with Tylenol. Yesterday morning at approximately 8:30 he noticed he had difficulty moving his right leg and walking. He also had difficulty writing in that his pressure was very small. Since then he has noted word finding difficulties.  He and his wife are planning to go to his granddaughter's graduation today [place], but when his symptoms persisted he decided to come to the emergency department to get checked.  Of note, Gregory [name] mentions that he regularly gets right-sided nosebleeds. He is also a mouth breather and is usually somewhat low on his oxygen sats. He reports oxygen via nasal cannula makes his oxygen saturations worse.  In the ER CT scan without contrast was done.  He is unable to have an MRI due to metal clips that were placed in his right nose for recurrent epistaxis Results below.  His labs are essentially normal other than mild hyponatremia 134, and mildly low chloride 99.   Review of Systems   In addition to the HPI above,  He complains of lower extremity leg cramps at night when he is trying to sleep.  No Fever-chills, No Headache, No changes with Vision or hearing, No problems  swallowing food or Liquids, No Chest pain, Cough or Shortness of Breath, No Abdominal pain, No Nausea or Vomiting, Bowel movements are regular, No Blood in stool or Urine, No dysuria, No new skin rashes or bruises, No new joints pains-aches,  No new weakness, tingling, numbness in any extremity, No recent weight gain or loss, A full 10 point Review of Systems was done, except as stated above, all other Review of Systems were negative.  Past Medical History  Past Medical History  Diagnosis Date  . Other diseases of nasal cavity and sinuses(478.19)   . Pure hypercholesterolemia   . Unspecified hypothyroidism   . Irritable bowel syndrome   . Benign neoplasm of colon   . Hypertrophy of prostate with urinary obstruction and other lower urinary tract symptoms (LUTS)   . Lumbago   . Plantar fascial fibromatosis   . Anxiety state, unspecified   . Persistent disorder of initiating or maintaining sleep   . GERD (gastroesophageal reflux disease)   . Arthritis   . 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 History of nonmelanoma skin cancer EXCISION SQUAMOUS CELL FROM HAND  . 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 Varicocelectomy  1991    Dr. Tresa Endo  . 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 Other surgical history      pulse laser for facial telangiectasias inthe past  . Excision left wrist ganglian/ myxoid cyst  05-30-2009  . Cardiovascular stress test  01-14-2003    NO ISCHEMIA / EF 61%/ NORMAL LE WALL MOTION  . Transthoracic echocardiogram  10-17-2008   DR CRENSHAW    NORMAL LVF/ EF 60%/  MILDLY DILATED RIGHT ATRIUM/ VENTRICULE  . 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 Renda Rolls      Social  History History  Substance Use Topics  . Smoking status: Never Smoker   . Smokeless tobacco: Never Used  . Alcohol Use: No  Married. Lives at home. Independent with his ADLs.  Family History Family History  Problem Relation Age of Onset  . Diabetes Mother   . Hypertension Mother   . Pancreatic cancer Mother   . Stroke Mother   . Kidney failure Father   . Hypertension Sister   No known history of brain tumor in the family.  Prior to Admission medications   Medication Sig Start Date End Date Taking? Authorizing Provider  acetaminophen (TYLENOL) 325 MG tablet Take 650 mg by mouth daily as needed (pain).   Yes Historical Provider, MD  Ascorbic Acid (VITAMIN C) 1000 MG tablet Take 2,000 mg by mouth daily.   Yes Historical Provider, MD  Cholecalciferol (VITAMIN D3) 2000 UNITS TABS Take 2,000 Units by mouth daily.   Yes Historical Provider, MD  clidinium-chlordiazePOXIDE (LIBRAX) 5-2.5 MG per capsule Take 1 capsule by mouth 3 (three) times daily as needed. 04/01/15  Yes Noralee Space, MD  clorazepate (TRANXENE) 7.5 MG tablet TAKE 1 TABLET BY MOUTH 3 TIMES A DAY AS NEEDED 07/25/14  Yes Noralee Space, MD  Diphenhyd-Hydrocort-Nystatin (FIRST-DUKES MOUTHWASH) SUSP 1 tsp gargle and swallow four times daily 10/08/14  Yes Noralee Space, MD  ferrous sulfate 325 (65 FE) MG tablet Take 325 mg by mouth daily with breakfast.   Yes Historical Provider, MD  levothyroxine (SYNTHROID, LEVOTHROID) 125 MCG tablet TAKE 1 TABLET (125 MCG TOTAL) BY MOUTH DAILY BEFORE BREAKFAST. 04/01/15  Yes Noralee Space, MD  loratadine (CLARITIN) 10 MG tablet Take 10 mg by mouth daily.   Yes Historical Provider, MD  Magnesium 250 MG TABS Take 250 mg by mouth at bedtime.    Yes Historical Provider, MD  meclizine (ANTIVERT) 25 MG tablet Take 1 tablet (25 mg total) by mouth daily as needed for dizziness. 04/01/15  Yes Noralee Space, MD  Multiple Vitamin (MULTIVITAMIN WITH MINERALS) TABS tablet Take 1 tablet by mouth daily. Centrum    Yes Historical Provider, MD  pantoprazole (PROTONIX) 40 MG tablet Take 1 tablet (40 mg total) by mouth daily. 04/01/15  Yes Noralee Space, MD  RAPAFLO 8 MG CAPS capsule TAKE ONE CAPSULE AT BEDTIME 10/08/14  Yes Noralee Space, MD    Allergies  Allergen Reactions  . Aspirin Other (See Comments)    nosebleeds  . Statins Other (See Comments)  intolerance  . Zocor [Simvastatin - High Dose] Other (See Comments)    Leg cramps    Physical Exam  Vitals  Blood pressure 142/72, pulse 66, temperature 97.1 F (36.2 C), temperature source Oral, resp. rate 13, SpO2 97 %.   General:  Well-developed, well-nourished pleasant male, lying in bed in NAD, wife at bedside.  Psych:  Normal affect and insight, Not Suicidal or Homicidal, Awake Alert, Oriented X 3.  Neuro:   Positive for Word finding difficulties. ++ right-sided facial droop, left upper extremity strength greater than right upper extremity strength, sensation is symmetric and intact all 4 extremities.  ENT:  Ears and Eyes appear Normal, Conjunctivae clear, PER. Moist oral mucosa without erythema or exudates.  Neck:  Supple, No lymphadenopathy appreciated  Respiratory:  Symmetrical chest wall movement, Good air movement bilaterally, CTAB.  Cardiac:  RRR, No Murmurs, no LE edema noted, no JVD.    Abdomen:  Positive bowel sounds, Soft, Non tender, Non distended,  No masses appreciated  Skin:  No Cyanosis, Normal Skin Turgor, No Skin Rash or Bruise.  Extremities:  Able to move all 4. 5/5 strength in each,  Right upper ext found to be slightly weaker than left.  Data Review  CBC  Recent Labs Lab 05/13/15 1201  WBC 10.2  HGB 15.5  HCT 44.6  PLT 244  MCV 87.8  MCH 30.5  MCHC 34.8  RDW 13.1  LYMPHSABS 3.1  MONOABS 1.0  EOSABS 0.1  BASOSABS 0.0    Chemistries   Recent Labs Lab 05/13/15 1201  NA 134*  K 4.5  CL 99*  CO2 26  GLUCOSE 94  BUN 11  CREATININE 1.07  CALCIUM 9.2  AST 24  ALT 16*  ALKPHOS 60   BILITOT 0.8     Coagulation profile  Recent Labs Lab 05/13/15 1201  INR 1.16     Imaging results:   Ct Head Wo Contrast  05/13/2015   ADDENDUM REPORT: 05/13/2015 15:27  ADDENDUM: There are small surgical clips within or near the posterior wall LEFT maxillary sinus/LEFT pterygopalatine fossa, in this patient with history of nose bleed. These are stable from prior scan 01/03/2003 and from 09/19/2008.   Electronically Signed   By: Rolla Flatten M.D.   On: 05/13/2015 15:27   05/13/2015   CLINICAL DATA:  RIGHT arm and leg weakness which began slightly over 24 hr ago.  EXAM: CT HEAD WITHOUT CONTRAST  TECHNIQUE: Contiguous axial images were obtained from the base of the skull through the vertex without intravenous contrast.  COMPARISON:  09/19/2008.  FINDINGS: There is a suspected intra-axial brain mass, with its epicenter LEFT lentiform nucleus, estimated cross-section 20 x 30 mm as seen on image 16 series 2. Fit Peripheral hyper attenuating rim, with low-attenuation central component. Marked surrounding vasogenic edema. Early left-to-right shift of 3 mm. Concern raised for primary or metastatic tumor. Abscess considered less likely. Calvarium intact. No sinus or mastoid disease.  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 Moderately extensive surrounding edema.  Findings discussed with triage nursing staff, Ria Comment. The patient will be escorted from the ED waiting room into an area where acute care can be provided.  Electronically Signed: By: Rolla Flatten M.D. On: 05/13/2015 13:10    My personal review of EKG: NSR, normal QTC.   Assessment & Plan  Principal Problem:   Brain mass Active Problems:   Hypothyroidism   HYPERCHOLESTEROLEMIA, MILD   OSLER-WEBER-RENDU DISEASE   GERD  Nocturnal leg cramps   Hypoxemia   Right sided weakness  Brain mass 3 cm x 2 cm with edema and left midline shift. Neurosurgery consulted. We'll admit to step down. We'll place on 4  mg of Decadron every 6 hours IV. Start low-dose Keppra for seizure prophylaxis. Management per neurosurgery.  Hereditary hemorrhagic telangiectasias  Will not use pharmaceutical DVT prophylaxis. Patient is prone to epistaxis. Will order Afrin when necessary. Monitor hemoglobin.  Hypothyroidism Continue Synthroid.  TSH checked 04/03/15 is 4.38.  Gerd PPI  Nocturnal Leg Cramps Robaxin.  Anxiety Continue patient's home dose of clorazepate.  Consultants Called:  Neuro Surgery on board Family Communication:   Wife at bedside. Code Status:  Full code Condition:  Guarded.  Potential Disposition:  To be determined.   Further work up needed.  Time spent in minutes : [address],  PA-C on 05/13/2015 at 3:47 PM Between 7am to 7pm - Pager - [phone] After 7pm go to [email or web address] - password [email or web address] And look for the night coverage person covering me after hours  [facility]

## 2015-05-13 NOTE — Consult Note (Addendum)
CC:  Chief Complaint  Patient presents with  . Cerebrovascular Accident    HPI: Gregory [name] is a 74 y.o. male who presents to the emergency department today with a primary complaint of about 1 day history of subjective right-sided weakness. He says yesterday morning, he noted that his right leg didn't seem to be working quite right, although he is able to still walk. He is not really noticing any weakness of the right arm. In addition to this, they state that over the last day or so they also noted very subtle difficulty in his ability to get the words out while he is talking. He does admit to a very mild headache this morning, which was relieved with over-the-counter Tylenol. He does say that this headache was not atypical, and he periodically does get headaches which are relieved with,. He has not noted any changes in vision, numbness, or tingling.  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 He apparently has had surgery with an implantable metal device in his nose, and was told he could not have an MRI. 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  PMH: Past Medical History  Diagnosis Date  . Other diseases of nasal cavity and sinuses(478.19)   . Pure hypercholesterolemia   . Unspecified hypothyroidism   . Irritable bowel syndrome   . Benign neoplasm of colon   . Hypertrophy of prostate with urinary obstruction and other lower urinary tract symptoms (LUTS)   . Lumbago   . Plantar fascial fibromatosis   . Anxiety state, unspecified   . Persistent disorder of initiating or maintaining sleep   . GERD (gastroesophageal reflux disease)   . Arthritis   . 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 History of  nonmelanoma skin cancer EXCISION SQUAMOUS CELL FROM HAND  . 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  WITHOUT HEMOPTYSIS    PSH: Past Surgical History  Procedure Laterality Date  . Varicocelectomy  1991    Dr. Tresa Endo  . 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 Other surgical history      pulse laser for facial telangiectasias inthe past  . Excision left wrist ganglian/ myxoid cyst  05-30-2009  . Cardiovascular stress test  01-14-2003    NO ISCHEMIA / EF 61%/ NORMAL LE WALL MOTION  . Transthoracic echocardiogram  10-17-2008   DR CRENSHAW    NORMAL LVF/ EF 60%/  MILDLY DILATED RIGHT ATRIUM/ VENTRICULE  . 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 Renda Rolls    SH: History  Substance Use Topics  . Smoking status: Never Smoker   . Smokeless tobacco: Never Used  . Alcohol Use: No    MEDS: Prior to Admission medications   Medication Sig Start Date End Date Taking? Authorizing Provider  acetaminophen (TYLENOL) 325 MG tablet Take 650 mg by mouth daily as needed (pain).   Yes Historical Provider, MD  Ascorbic Acid (VITAMIN C) 1000 MG tablet Take 2,000 mg by mouth daily.   Yes Historical Provider, MD  Cholecalciferol (VITAMIN D3) 2000 UNITS TABS Take 2,000 Units by mouth daily.   Yes  Historical Provider, MD  clidinium-chlordiazePOXIDE (LIBRAX) 5-2.5 MG per capsule Take 1 capsule by mouth 3 (three) times daily as needed. 04/01/15  Yes Noralee Space, MD  clorazepate (TRANXENE) 7.5 MG tablet TAKE 1 TABLET BY MOUTH 3 TIMES A DAY AS NEEDED 07/25/14  Yes Noralee Space, MD  Diphenhyd-Hydrocort-Nystatin (FIRST-DUKES MOUTHWASH) SUSP 1 tsp gargle and swallow four times daily 10/08/14  Yes Noralee Space, MD  ferrous sulfate 325 (65 FE) MG tablet Take 325 mg by mouth daily with breakfast.   Yes Historical Provider, MD  levothyroxine (SYNTHROID, LEVOTHROID) 125 MCG tablet TAKE 1 TABLET (125 MCG TOTAL) BY MOUTH DAILY BEFORE  BREAKFAST. 04/01/15  Yes Noralee Space, MD  loratadine (CLARITIN) 10 MG tablet Take 10 mg by mouth daily.   Yes Historical Provider, MD  Magnesium 250 MG TABS Take 250 mg by mouth at bedtime.    Yes Historical Provider, MD  meclizine (ANTIVERT) 25 MG tablet Take 1 tablet (25 mg total) by mouth daily as needed for dizziness. 04/01/15  Yes Noralee Space, MD  Multiple Vitamin (MULTIVITAMIN WITH MINERALS) TABS tablet Take 1 tablet by mouth daily. Centrum   Yes Historical Provider, MD  pantoprazole (PROTONIX) 40 MG tablet Take 1 tablet (40 mg total) by mouth daily. 04/01/15  Yes Noralee Space, MD  RAPAFLO 8 MG CAPS capsule TAKE ONE CAPSULE AT BEDTIME 10/08/14  Yes Noralee Space, MD  levothyroxine (SYNTHROID, LEVOTHROID) 125 MCG tablet TAKE 1 TABLET (125 MCG TOTAL) BY MOUTH DAILY BEFORE BREAKFAST. Patient not taking: Reported on 05/13/2015 04/07/15   Noralee Space, MD  metaxalone (SKELAXIN) 800 MG tablet Take 1 tablet (800 mg total) by mouth 3 (three) times daily as needed for muscle spasms. Patient not taking: Reported on 05/13/2015 12/09/14   Noralee Space, MD  traMADol (ULTRAM) 50 MG tablet Take 1 tablet (50 mg total) by mouth 3 (three) times daily as needed. 04/01/15   Noralee Space, MD  zolpidem (AMBIEN) 10 MG tablet Take 1 tablet (10 mg total) by mouth at bedtime as needed for sleep. Patient not taking: Reported on 05/13/2015 10/08/14   Noralee Space, MD    ALLERGY: Allergies  Allergen Reactions  . Aspirin Other (See Comments)    nosebleeds  . Statins Other (See Comments)    intolerance  . Zocor [Simvastatin - High Dose] Other (See Comments)    Leg cramps    ROS: ROS  NEUROLOGIC EXAM: Awake, alert, oriented Memory and concentration grossly intact Speech fluent, appropriate CN grossly intact Motor exam: Upper Extremities Deltoid Bicep Tricep Grip  Right 5/5 5/5 5/5 5/5  Left 5/5 5/5 5/5 5/5   Lower Extremity IP Quad PF DF EHL  Right 5/5 5/5 5/5 5/5 5/5  Left 5/5 5/5 5/5 5/5 5/5  No  drift in upper/lower extremities Sensation grossly intact to LT  [facility]: CT of the head without contrast was reviewed. This demonstrates a approximate 2 x 3 cm mass centered in the left basal ganglia. There is significant surrounding edema. There is minimal midline shift, and mild effacement of the left lateral ventricle.  IMPRESSION: - 74 y.o. male with newly diagnosed left ganglionic intra-axial mass responsible for his subjective right-sided weakness and speech difficulty. 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 It is also possible this represents a metastatic deposit from a previously undiagnosed primary site. I feel much less likely this represents abscess, or lymphoma, or other inflammatory disease such as sarcoid.  From a  surgical standpoint, the lesion is in a highly eloquent area, and would therefore not be easily resectable without a significant risk of aphasia and hemiparesis/plegia. If the patient does undergo a metastatic workup without identification of the primary source, we may instead elect to proceed with a stereotactic biopsy.  PLAN: - Would start Decadron 4 mg every 6 hours - In lieu of MRI, would obtain CT scan of the brain with contrast with stereotactic protocol - Given the patient's other medical conditions, would admit to the hospitalist service. The patient will likely require a metastatic workup.  I did speak with the patient regarding the above imaging findings and the above outlined treatment plan. All questions were answered.

## 2015-05-13 NOTE — ED Notes (Signed)
Patient states has metal clamp in nostril from ENT and was instructed to never get an MRI. MRI staff sts CT did not reveal clamp. Marissa PA made aware.

## 2015-05-13 NOTE — ED Provider Notes (Signed)
CSN: [id number]     Arrival date & time 05/13/15  1144 History   First MD Initiated Contact with Patient 05/13/15 1317     Chief Complaint  Patient presents with  . Cerebrovascular Accident     (Consider location/radiation/quality/duration/timing/severity/associated sxs/prior Treatment) The history is provided by the patient. No language interpreter was used.  Gregory [name] is a 74 y/o M with PMHx of HCL, hypothyroidism, IBS, benign neoplasm of the colin, BPH, GERD, skin cancer presenting to the ED with weakness to the right upper and lower extremities that started yesterday at approximately 8:30AM. Patient reported that he has been having weakness in his right leg. Stated that when he writes with his right hand reported that he is writing in small letters when he normally writes in a large font. Patient stated that he was been noticing that when he walks he favors the right side. Stated that he did have a headache after waking up this morning, but stated that it was a light headache - reported that he took a Tylenol and stated that the headache went away. Wife, who accompanies patient, reported that there has been no changes to mentation - reported that he has been the same. Stated that he does have history of skin cancer. Denied family history of brain cancer. Reported that sibling had stroke when they were 62 years old. Patient was told that he cannot get MRI secondary to metal in face after surgery for control of epistaxis. Denied fever, chills, neck pain, neck stiffness, confusion, disorientation, facial drooping, slurred speech, difficulty forming words, numbness, tingling, loss sensation, fall, head injuries, blurred vision, sudden loss of vision, difficulty swallowing, dizziness, travel, leg swelling, chest pain, shortness of breath, difficulty breathing. PCP Dr. Lenna Gilford   Past Medical History  Diagnosis Date  . Other diseases of nasal cavity and sinuses(478.19)   . Pure  hypercholesterolemia   . Unspecified hypothyroidism   . Irritable bowel syndrome   . Benign neoplasm of colon   . Hypertrophy of prostate with urinary obstruction and other lower urinary tract symptoms (LUTS)   . Lumbago   . Plantar fascial fibromatosis   . Anxiety state, unspecified   . Persistent disorder of initiating or maintaining sleep   . GERD (gastroesophageal reflux disease)   . Arthritis   . 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 History of nonmelanoma skin cancer EXCISION SQUAMOUS CELL FROM HAND  . 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 Varicocelectomy  1991    Dr. Tresa Endo  . 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 Other surgical history      pulse laser for facial telangiectasias inthe past  . Excision left wrist ganglian/ myxoid cyst  05-30-2009  . Cardiovascular stress test  01-14-2003    NO ISCHEMIA / EF 61%/ NORMAL LE WALL MOTION  . Transthoracic echocardiogram  10-17-2008   DR CRENSHAW    NORMAL LVF/ EF 60%/  MILDLY DILATED RIGHT ATRIUM/ VENTRICULE  . 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 Renda Rolls   Family History  Problem Relation Age of Onset  . Diabetes Mother   . Hypertension Mother   . Pancreatic cancer Mother   . Stroke  Mother   . Kidney failure Father   . Hypertension Sister    History  Substance Use Topics  . Smoking status: Never Smoker   . Smokeless tobacco: Never Used  . Alcohol Use: No    Review of Systems  Constitutional: Negative for fever and chills.  Eyes: Negative for visual disturbance.  Respiratory: Negative for chest tightness and shortness of breath.   Cardiovascular: Negative for chest pain.  Gastrointestinal: Negative for nausea, vomiting, abdominal  pain, diarrhea, constipation, blood in stool and anal bleeding.  Musculoskeletal: Negative for back pain, neck pain and neck stiffness.  Neurological: Positive for weakness (right sided) and headaches. Negative for dizziness, syncope, light-headedness and numbness.     Allergies  Zocor  Home Medications   Prior to Admission medications   Medication Sig Start Date End Date Taking? Authorizing Provider  acetaminophen (TYLENOL) 325 MG tablet Take 650 mg by mouth daily as needed (pain).    Historical Provider, MD  Ascorbic Acid (VITAMIN C) 1000 MG tablet Take 2,000 mg by mouth daily.    Historical Provider, MD  Cholecalciferol (VITAMIN D3) 2000 UNITS TABS Take 2,000 Units by mouth daily.    Historical Provider, MD  clidinium-chlordiazePOXIDE (LIBRAX) 5-2.5 MG per capsule Take 1 capsule by mouth 3 (three) times daily as needed. 04/01/15   Noralee Space, MD  clorazepate (TRANXENE) 7.5 MG tablet TAKE 1 TABLET BY MOUTH 3 TIMES A DAY AS NEEDED 07/25/14   Noralee Space, MD  Diphenhyd-Hydrocort-Nystatin (FIRST-DUKES MOUTHWASH) SUSP 1 tsp gargle and swallow four times daily 10/08/14   Noralee Space, MD  ferrous sulfate 325 (65 FE) MG tablet Take 325 mg by mouth daily with breakfast.    Historical Provider, MD  levothyroxine (SYNTHROID, LEVOTHROID) 125 MCG tablet TAKE 1 TABLET (125 MCG TOTAL) BY MOUTH DAILY BEFORE BREAKFAST. 04/01/15   Noralee Space, MD  levothyroxine (SYNTHROID, LEVOTHROID) 125 MCG tablet TAKE 1 TABLET (125 MCG TOTAL) BY MOUTH DAILY BEFORE BREAKFAST. 04/07/15   Noralee Space, MD  loratadine (CLARITIN) 10 MG tablet Take 10 mg by mouth daily.    Historical Provider, MD  Magnesium 250 MG TABS Take 250 mg by mouth at bedtime.     Historical Provider, MD  meclizine (ANTIVERT) 25 MG tablet Take 1 tablet (25 mg total) by mouth daily as needed for dizziness. 04/01/15   Noralee Space, MD  metaxalone (SKELAXIN) 800 MG tablet Take 1 tablet (800 mg total) by mouth 3 (three) times daily as needed for  muscle spasms. 12/09/14   Noralee Space, MD  mometasone-formoterol (DULERA) 100-5 MCG/ACT AERO Inhale 2 puffs into the lungs 2 (two) times daily. Rinse after each use    Historical Provider, MD  Multiple Vitamin (MULTIVITAMIN WITH MINERALS) TABS tablet Take 1 tablet by mouth daily. Centrum    Historical Provider, MD  pantoprazole (PROTONIX) 40 MG tablet Take 1 tablet (40 mg total) by mouth daily. 04/01/15   Noralee Space, MD  RAPAFLO 8 MG CAPS capsule TAKE ONE CAPSULE AT BEDTIME 10/08/14   Noralee Space, MD  traMADol (ULTRAM) 50 MG tablet Take 1 tablet (50 mg total) by mouth 3 (three) times daily as needed. 04/01/15   Noralee Space, MD  zolpidem (AMBIEN) 10 MG tablet Take 1 tablet (10 mg total) by mouth at bedtime as needed for sleep. 10/08/14   Noralee Space, MD   BP 130/77 mmHg  Pulse 62  Temp(Src) 97.5 F (36.4 C) (Oral)  Resp 13  SpO2 94% Physical Exam  Constitutional: He is oriented to person, place, and time. He appears well-developed and well-nourished. No distress.  HENT:  Head: Normocephalic and atraumatic.  Mouth/Throat: Oropharynx is clear and moist. No oropharyngeal exudate.  Eyes: Conjunctivae and EOM are normal. Pupils are equal, round, and reactive to light. Right eye exhibits no discharge. Left eye exhibits no discharge.  Neck: Normal range of motion. Neck supple. No tracheal deviation present.  Cardiovascular: Normal rate, regular rhythm and normal heart sounds.  Exam reveals no friction rub.   No murmur heard. Pulses:      Radial pulses are 2+ on the right side, and 2+ on the left side.       Dorsalis pedis pulses are 2+ on the right side, and 2+ on the left side.  Cap refill < 3 seconds   Pulmonary/Chest: Effort normal and breath sounds normal. No respiratory distress. He has no wheezes. He has no rales.  Musculoskeletal: Normal range of motion.  Full ROM to upper and lower extremities without difficulty noted, negative ataxia noted.  Lymphadenopathy:    He has no  cervical adenopathy.  Neurological: He is alert and oriented to person, place, and time. No cranial nerve deficit. He exhibits normal muscle tone. Coordination normal. GCS eye subscore is 4. GCS verbal subscore is 5. GCS motor subscore is 6.  Cranial nerves III-XII grossly intact Strength 5+/5+ to left upper and lower extremities bilaterally with resistance applied, equal distribution noted - decreased strength noted to right upper and right lower extremities Left grip strength greater than right grip strength Mild facial droop noted to the right side Negative slurred speech Difficulty forming words intermittently Negative arm drift Patient follows commands well Patient response to questions appropriately - some episodes of confusion Patient is able to bring finger to nose bilaterally and then to this provider's finger without difficulty or ataxia  Skin: Skin is warm and dry. No rash noted. He is not diaphoretic. No erythema.  Psychiatric: He has a normal mood and affect. His behavior is normal. Thought content normal.  Nursing note and vitals reviewed.   ED Course  Procedures (including critical care time)  Results for orders placed or performed during the hospital encounter of 05/13/15  Protime-INR  Result Value Ref Range   Prothrombin Time 15.0 11.6 - 15.2 seconds   INR 1.16 0.00 - 1.49  APTT  Result Value Ref Range   aPTT 35 24 - 37 seconds  CBC  Result Value Ref Range   WBC 10.2 4.0 - 10.5 K/uL   RBC 5.08 4.22 - 5.81 MIL/uL   Hemoglobin 15.5 13.0 - 17.0 g/dL   HCT 44.6 39.0 - 52.0 %   MCV 87.8 78.0 - 100.0 fL   MCH 30.5 26.0 - 34.0 pg   MCHC 34.8 30.0 - 36.0 g/dL   RDW 13.1 11.5 - 15.5 %   Platelets 244 150 - 400 K/uL  Differential  Result Value Ref Range   Neutrophils Relative % 59 43 - 77 %   Neutro Abs 6.0 1.7 - 7.7 K/uL   Lymphocytes Relative 30 12 - 46 %   Lymphs Abs 3.1 0.7 - 4.0 K/uL   Monocytes Relative 10 3 - 12 %   Monocytes Absolute 1.0 0.1 - 1.0 K/uL    Eosinophils Relative 1 0 - 5 %   Eosinophils Absolute 0.1 0.0 - 0.7 K/uL   Basophils Relative 0 0 - 1 %   Basophils Absolute 0.0 0.0 - 0.1 K/uL  Comprehensive metabolic panel  Result Value Ref Range   Sodium 134 (L) 135 - 145 mmol/L   Potassium 4.5 3.5 - 5.1 mmol/L   Chloride 99 (L) 101 - 111 mmol/L   CO2 26 22 - 32 mmol/L   Glucose, Bld 94 65 - 99 mg/dL   BUN 11 6 - 20 mg/dL   Creatinine, Ser 1.07 0.61 - 1.24 mg/dL   Calcium 9.2 8.9 - 10.3 mg/dL   Total Protein 6.6 6.5 - 8.1 g/dL   Albumin 3.9 3.5 - 5.0 g/dL   AST 24 15 - 41 U/L   ALT 16 (L) 17 - 63 U/L   Alkaline Phosphatase 60 38 - 126 U/L   Total Bilirubin 0.8 0.3 - 1.2 mg/dL   GFR calc non Af Amer >60 >60 mL/min   GFR calc Af Amer >60 >60 mL/min   Anion gap 9 5 - 15  I-stat troponin, ED (not at [facility], [facility])  Result Value Ref Range   Troponin i, poc 0.00 0.00 - 0.08 ng/mL   Comment 3            Labs Review Labs Reviewed  COMPREHENSIVE METABOLIC PANEL - Abnormal; Notable for the following:    Sodium 134 (*)    Chloride 99 (*)    ALT 16 (*)    All other components within normal limits  PROTIME-INR  APTT  CBC  DIFFERENTIAL  I-STAT TROPOININ, ED    Imaging Review Ct Head Wo Contrast  05/13/2015   ADDENDUM REPORT: 05/13/2015 15:27  ADDENDUM: There are small surgical clips within or near the posterior wall LEFT maxillary sinus/LEFT pterygopalatine fossa, in this patient with history of nose bleed. These are stable from prior scan 01/03/2003 and from 09/19/2008.   Electronically Signed   By: Rolla Flatten M.D.   On: 05/13/2015 15:27   05/13/2015   CLINICAL DATA:  RIGHT arm and leg weakness which began slightly over 24 hr ago.  EXAM: CT HEAD WITHOUT CONTRAST  TECHNIQUE: Contiguous axial images were obtained from the base of the skull through the vertex without intravenous contrast.  COMPARISON:  09/19/2008.  FINDINGS: There is a suspected intra-axial brain mass, with its epicenter LEFT lentiform nucleus, estimated cross-section  20 x 30 mm as seen on image 16 series 2. Fit Peripheral hyper attenuating rim, with low-attenuation central component. Marked surrounding vasogenic edema. Early left-to-right shift of 3 mm. Concern raised for primary or metastatic tumor. Abscess considered less likely. Calvarium intact. No sinus or mastoid disease.  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 Moderately extensive surrounding edema.  Findings discussed with triage nursing staff, Ria Comment. The patient will be escorted from the ED waiting room into an area where acute care can be provided.  Electronically Signed: By: Rolla Flatten M.D. On: 05/13/2015 13:10     EKG Interpretation   Date/Time:  Tuesday May 13 2015 11:53:12 EDT Ventricular Rate:  68 PR Interval:  140 QRS Duration: 88 QT Interval:  408 QTC Calculation: 433 R Axis:   74 Text Interpretation:  Normal sinus rhythm Low voltage QRS Borderline ECG  No significant change since last tracing Confirmed by YAO  MD, DAVID  ([id number]) on 05/13/2015 1:19:04 PM       2:00 PM Patient reported that he has metal in his face secondary to continuous epistaxis - was told by his ENT that he cannot get MRIs.   2:15 PM Spoke with Dr. Yevonne Aline, Neurology. Discussed case, labs, imaging,  ED course. Recommended Neurosurgery to be consulted with next step. Reported that if patient cannot get a MRI, reported that the next step would be CT head with contrast.   2:30 PM Dr. Kathyrn Sheriff, on call neurosurgeon consulted - currently in [place], spoke with his nurse. Nurse stated that physician will review the CT scan and call this provider back.   2:39 PM Spoke with Dr. Kathyrn Sheriff, Neurosurgery. Imaging has been reviewed. Recommended CT head with contrast to be performed. Reported that he will come down to the ED and speak with the patient. No recommendations given regarding steroids.   3:02 PM Spoke with Velta Addison, NP, from [facility]. Discussed labs, imaging, ED course,  Neurosurgery recommendations. [facility] to admit patient - [facility] to [facility].   3:12 PM Spoke with Dr. Allie Bossier - recommended Decadron 4 mg IV to be administered.    MDM   Final diagnoses:  Brain tumor  Right sided weakness    Medications  sodium chloride 0.9 % bolus 1,000 mL (1,000 mLs Intravenous New Bag/Given 05/13/15 1527)  dexamethasone (DECADRON) injection 4 mg (4 mg Intravenous Given 05/13/15 1527)    Filed Vitals:   05/13/15 1415 05/13/15 1430 05/13/15 1445 05/13/15 1500  BP: 143/64 118/49  142/72  Pulse: 67 63 62 66  Temp:      TempSrc:      Resp: 15 15 13 13   SpO2: 96% 96% 95% 97%   EKG noted normal sinus rhythm with a heart rate of 68 bpm-no skin change since last tracing. I-STAT troponin negative elevation. PT INR and APTT within normal limits. CBC negative elevated leukocytosis. Hemoglobin 15.5, hematocrit 44.6. CMP noted sodium of 134. Kidney and liver functioning well. CT head without contrast noted a 2 x 3 cm left basal ganglia mass that is likely a tumor with moderate extensive surrounding edema with an early left-to-right shift 3 mm concern raised for primary or metastatic tumor. Abscess less likely. There are small surgical clips within her new the posterior wall of the left maxillary sinus-stable since 01/03/2003. Patient presenting to the ED with what appears to be a tumor to the left basal ganglia noted on CT without contrast of head. Neurological deficits such as right-sided facial droop with right-sided weakness to upper and lower extremities. Neurosurgery consulted, Dr. Kathyrn Sheriff, recommended admission and CT head with contrast-neurosurgery to consult patient. Patient to be admitted for further workup regarding primary versus metastatic cancer, as well as monitoring of neurological status. Discussed plan for admission in great detail with patient and wife-agreed to plan of care. Patient to be admitted under the care of [facility]. Patient stable for transfer to  floor.  Jamse Mead, PA-C 05/13/15 1538  Wandra Arthurs, MD 05/17/15 1024

## 2015-05-13 NOTE — ED Notes (Signed)
Marissa PA-C at bedside assessing patient and informing patient of CT results and plan of care

## 2015-05-13 NOTE — ED Notes (Signed)
hospitalist at bedside,

## 2015-05-13 NOTE — ED Notes (Signed)
Pt presents to department for evaluation of R arm weakness and R leg weakness. Onset Monday morning at 8am. R sided grip weaker than L. No drift noted. Speech clear. Pt is alert and oriented x4.

## 2015-05-14 ENCOUNTER — Telehealth: Payer: Self-pay | Admitting: Pulmonary Disease

## 2015-05-14 DIAGNOSIS — G939 Disorder of brain, unspecified: Secondary | ICD-10-CM

## 2015-05-14 DIAGNOSIS — E780 Pure hypercholesterolemia: Secondary | ICD-10-CM

## 2015-05-14 DIAGNOSIS — K219 Gastro-esophageal reflux disease without esophagitis: Secondary | ICD-10-CM

## 2015-05-14 DIAGNOSIS — G4762 Sleep related leg cramps: Secondary | ICD-10-CM

## 2015-05-14 LAB — BASIC METABOLIC PANEL
Anion gap: 11 (ref 5–15)
BUN: 11 mg/dL (ref 6–20)
CALCIUM: 9 mg/dL (ref 8.9–10.3)
CO2: 21 mmol/L — ABNORMAL LOW (ref 22–32)
CREATININE: 0.95 mg/dL (ref 0.61–1.24)
Chloride: 97 mmol/L — ABNORMAL LOW (ref 101–111)
GFR calc Af Amer: >60 mL/min (ref >60)
Glucose, Bld: 126 mg/dL — ABNORMAL HIGH (ref 65–99)
POTASSIUM: 4.5 mmol/L (ref 3.5–5.1)
Sodium: 129 mmol/L — ABNORMAL LOW (ref 135–145)

## 2015-05-14 LAB — CBC
HCT: 44.8 % (ref 39.0–52.0)
HEMOGLOBIN: 15.6 g/dL (ref 13.0–17.0)
MCH: 30.5 pg (ref 26.0–34.0)
MCHC: 34.8 g/dL (ref 30.0–36.0)
MCV: 87.5 fL (ref 78.0–100.0)
Platelets: 248 10*3/uL (ref 150–400)
RBC: 5.12 MIL/uL (ref 4.22–5.81)
RDW: 13 % (ref 11.5–15.5)
WBC: 8.9 10*3/uL (ref 4.0–10.5)

## 2015-05-14 MED ORDER — SODIUM CHLORIDE 0.9 % IV SOLN
INTRAVENOUS | Status: DC
  Administered 2015-05-14 – 2015-05-16 (×3): via INTRAVENOUS
  Administered 2015-05-17: 75 mL/h via INTRAVENOUS
  Administered 2015-05-18: 1000 mL via INTRAVENOUS

## 2015-05-14 NOTE — Progress Notes (Signed)
[facility] PROGRESS NOTE   Gregory [name] RNH:[id number] DOB: June 19, 1941 DOA: 05/13/2015 PCP: Gregory Space, MD  HPI/Subjective: Denies fever chills, right leg weakness improved slightly since yesterday.  Assessment/Plan: Principal Problem:   Brain mass Active Problems:   Hypothyroidism   HYPERCHOLESTEROLEMIA, MILD   OSLER-WEBER-RENDU DISEASE   GERD   Nocturnal leg cramps   Hypoxemia   Right sided weakness    Brain mass Presented with headache and right lower extremity weakness. CT showed 32 cm brain mass with edema and slight midline shift. Neurosurgery consulted and recommended steroids and metastatic cancer workup. CT of abdomen pelvis was done and showed no suspicious lesion for primary cancer. PSA is normal, normal colonoscopy in March 2012. Await further recommendation from the neurosurgical service.  Hyponatremia Presented with sodium of 134 and sodium today is 129. 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  Right leg weakness Secondary to brain mass, improved slightly after initiation of steroids.  Hypothyroidism Continue Synthroid, TSH was 4.38 on 04/03/2015   Code Status: Full Code Family Communication: Plan discussed with the patient. Disposition Plan: Remains inpatient Diet: Diet NPO time specified  Consultants:  None  Procedures:  None  Antibiotics:  None   Objective: Filed Vitals:   05/14/15 1209  BP:   Pulse:   Temp: 97.5 F (36.4 C)  Resp:     Intake/Output Summary (Last 24 hours) at 05/14/15 1426 Last data filed at 05/14/15 0630  Gross per 24 hour  Intake   1000 ml  Output    500 ml  Net    500 ml   Filed Weights   05/13/15 1815  Weight: 82.4 kg (181 lb 10.5 oz)    Exam: General: Alert and awake, oriented x3, not in any acute  distress. HEENT: anicteric sclera, pupils reactive to light and accommodation, EOMI CVS: S1-S2 clear, no murmur rubs or gallops Chest: clear to auscultation bilaterally, no wheezing, rales or rhonchi Abdomen: soft nontender, nondistended, normal bowel sounds, no organomegaly Extremities: no cyanosis, clubbing or edema noted bilaterally Neuro: Cranial nerves II-XII intact, no focal neurological deficits  Data Reviewed: Basic Metabolic Panel:  Recent Labs Lab 05/13/15 1201 05/14/15 0319  NA 134* 129*  K 4.5 4.5  CL 99* 97*  CO2 26 21*  GLUCOSE 94 126*  BUN 11 11  CREATININE 1.07 0.95  CALCIUM 9.2 9.0   Liver Function Tests:  Recent Labs Lab 05/13/15 1201  AST 24  ALT 16*  ALKPHOS 60  BILITOT 0.8  PROT 6.6  ALBUMIN 3.9   No results for input(s): LIPASE, AMYLASE in the last 168 hours. No results for input(s): AMMONIA in the last 168 hours. CBC:  Recent Labs Lab 05/13/15 1201 05/14/15 0319  WBC 10.2 8.9  NEUTROABS 6.0  --   HGB 15.5 15.6  HCT 44.6 44.8  MCV 87.8 87.5  PLT 244 248   Cardiac Enzymes: No results for input(s): CKTOTAL, CKMB, CKMBINDEX, TROPONINI in the last 168 hours. BNP (last 3 results) No results for input(s): BNP in the last 8760 hours.  ProBNP (last 3 results) No results for input(s): PROBNP in the last 8760 hours.  CBG: No results for input(s): GLUCAP in the last 168 hours.  Micro No results found for this or any previous visit (from the past 240 hour(s)).   Studies: Ct Head Wo  Contrast  05/13/2015   ADDENDUM REPORT: 05/13/2015 15:27  ADDENDUM: There are small surgical clips within or near the posterior wall LEFT maxillary sinus/LEFT pterygopalatine fossa, in this patient with history of nose bleed. These are stable from prior scan 01/03/2003 and from 09/19/2008.   Electronically Signed   By: Rolla Flatten M.D.   On: 05/13/2015 15:27   05/13/2015   CLINICAL DATA:  RIGHT arm and leg weakness which began slightly over 24 hr ago.  EXAM: CT  HEAD WITHOUT CONTRAST  TECHNIQUE: Contiguous axial images were obtained from the base of the skull through the vertex without intravenous contrast.  COMPARISON:  09/19/2008.  FINDINGS: There is a suspected intra-axial brain mass, with its epicenter LEFT lentiform nucleus, estimated cross-section 20 x 30 mm as seen on image 16 series 2. Fit Peripheral hyper attenuating rim, with low-attenuation central component. Marked surrounding vasogenic edema. Early left-to-right shift of 3 mm. Concern raised for primary or metastatic tumor. Abscess considered less likely. Calvarium intact. No sinus or mastoid disease.  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 Moderately extensive surrounding edema.  Findings discussed with triage nursing staff, Gregory [name]. The patient will be escorted from the ED waiting room into an area where acute care can be provided.  Electronically Signed: By: Rolla Flatten M.D. On: 05/13/2015 13:10   Ct Head W Contrast  05/14/2015   CLINICAL DATA:  One-day history of RIGHT-sided weakness, mild headache. History of AVM, Osler-Weber-Rendu.  EXAM: CT HEAD WITH CONTRAST  TECHNIQUE: Contiguous axial images were obtained from the base of the skull through the vertex with intravenous contrast.  CONTRAST:  165mL OMNIPAQUE IOHEXOL 300 MG/ML  SOLN  COMPARISON:  CT head May 13, 2015 at 1301 hours and CT head September 19, 2008  FINDINGS: Stage irregular peripherally enhancing, centrally hypodense 3.1 x 1.9 cm mass in the LEFT corona radiata, basal ganglia shows extensive surrounding low-density vasogenic edema, resulting in 4 mm LEFT-to-RIGHT midline shift, similar. Partially effaced LEFT lateral ventricle without entrapment. No satellite areas of suspicious enhancement.  No acute large vascular territory infarcts. No abnormal extra-axial fluid collections, suspicious extra-axial enhancement or extra-axial masses. Basal cisterns are patent.  Ocular globes and orbital contents are  unremarkable. No destructive bony lesions. Trace paranasal sinus mucosal thickening without air-fluid levels. The mastoid air cells are well aerated.  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  Stable 4 mm LEFT-to-RIGHT midline shift.   Electronically Signed   By: Elon Alas M.D.   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  CONTRAST:  149mL OMNIPAQUE IOHEXOL 300 MG/ML  SOLN  COMPARISON:  None.  FINDINGS: CT CHEST FINDINGS  There is a large multilobulated at least 7.2 x 3.8 x 3.4 cm AV malformation within right lower lobe (representative coronal image 73, series 5, axial image 50, series 2) which is supplied by a hypertrophied segmental branch of the right lower lobe pulmonary artery and draining via a hypertrophy right lower lobe pulmonary vein. This finding is  without associated enlargement of the caliber the main pulmonary artery or significant cardiomegaly. There is no definitive mural thrombus within the AV malformation.  Minimal dependent subpleural ground-glass atelectasis. No focal airspace opacities. No pleural effusion or pneumothorax. The central pulmonary airways appear widely patent.  There is minimal biapical pleural parenchymal thickening. No discrete pulmonary nodules.  No mediastinal, hilar or axillary lymphadenopathy.  No acute or aggressive osseous abnormalities within the chest.  Regional soft  tissues appear normal. The thyroid gland appears diminutive.  Although this examination was not tailored for the evaluation the pulmonary arteries, there are no discrete filling defects within the central pulmonary arterial tree to suggest central pulmonary embolism.  Normal caliber of the thoracic aorta. Conventional configuration of the aortic arch. The branch vessels of the aortic arch appear widely patent throughout their imaged course. No definite thoracic aortic dissection or periaortic stranding on this nongated examination.  CT ABDOMEN AND PELVIS FINDINGS  Normal hepatic contour. Heterogeneous enhancement of the majority of the right lobe of the liver resolves on the provided portal venous phase imaging. This finding is associated with hypertrophy of the hepatic arterial system. A minimal amount of beaded irregularity is noted within the distal aspect of a anterior branch of the right hepatic artery (representative coronal images 48 and 49, series 5). While a discrete AV malformation is not identified, early shunting is noted through the left lobe of the liver.  Punctate (approximately 0.7 cm) hypoattenuating lesion within the subcapsular aspect of the left lobe of the liver (coronal image 21, series 5) is too small to adequately characterize though may represent a hepatic cyst. No additional discrete hepatic lesions identified.  Normal appearance of the gallbladder. No radiopaque gallstones. No intra extrahepatic biliary duct dilatation. No ascites.  There is symmetric enhancement and excretion of the bilateral kidneys. No definite renal stones in this postcontrast examination. No discrete renal lesions. No urinary obstruction or perinephric stranding. Normal appearance of the bilateral adrenal glands, pancreas and spleen. No perisplenic stranding.  Moderate colonic stool burden without evidence of enteric obstruction. The bowel is normal in course and caliber without wall thickening. The appendix is not  visualized, however there is no pericecal inflammatory change. No pneumoperitoneum, pneumatosis or portal venous gas. Small hiatal hernia.  Scattered mixed calcified and noncalcified atherosclerotic plaque within a normal caliber abdominal aorta. The major branch vessels of the abdominal aorta appear patent on this non CTA examination. Note is made of a accessory right renal artery which supplies the inferior pole the right kidney. Incidental note is made of a circumaortic left renal vein.  No retroperitoneal, mesenteric, pelvic or inguinal lymphadenopathy.  Scattered calcifications within a normal sized prostate gland. Normal appearance of the urinary bladder given degree distention. No free fluid in the pelvic cul-de-sac.  No acute or aggressive osseous abnormalities within the abdomen or pelvis. 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 Mild rotatory scoliotic curvature of the thoracolumbar spine with dominant caudal component convex to the left.  Regional soft tissues appear normal.  IMPRESSION: Chest Impression:  1. 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 This large pulmonary AV malformation is without associated enlargement of the main pulmonary artery or definitive cardiomegaly. Abdomen and pelvis Impression:  1. No acute findings within the abdomen or pelvis. 2. Marked heterogeneous perfusion of the right lobe of the liver. 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 This finding is without associated stigmata of portal venous hypertension, specifically, there is no splenomegaly, ascites or definitive gastroesophageal varices and thus these findings are of uncertain clinical significance. Clinical correlation with LFTs is recommended. 3. 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   Electronically Signed   By: Sandi Mariscal M.D.   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  CONTRAST:  142mL OMNIPAQUE IOHEXOL 300 MG/ML  SOLN  COMPARISON:  None.  FINDINGS: CT CHEST FINDINGS  There is a large multilobulated at least 7.2 x 3.8 x 3.4 cm AV malformation within right lower lobe (representative coronal image 73, series 5, axial image 50, series 2) which is supplied by a hypertrophied segmental branch of the right lower lobe pulmonary artery and draining via a hypertrophy right lower lobe pulmonary vein. This finding is without associated enlargement of the caliber the main pulmonary artery or significant cardiomegaly. There is no definitive mural thrombus within the AV malformation.  Minimal dependent subpleural ground-glass atelectasis. No focal airspace opacities. No pleural effusion or pneumothorax. The central pulmonary airways appear widely patent.  There is minimal biapical pleural parenchymal thickening. No discrete  pulmonary nodules.  No mediastinal, hilar or axillary lymphadenopathy.  No acute or aggressive osseous abnormalities within the chest.  Regional soft tissues appear normal. The thyroid gland appears diminutive.  Although this examination was not tailored for the evaluation the pulmonary arteries, there are no discrete filling defects within the central pulmonary arterial tree to suggest central pulmonary embolism.  Normal caliber of the thoracic aorta. Conventional configuration of the aortic arch. The branch vessels of the aortic arch appear widely patent throughout their imaged course. No definite thoracic aortic dissection or periaortic stranding on this nongated examination.  CT ABDOMEN AND PELVIS FINDINGS  Normal hepatic contour. Heterogeneous enhancement of the majority of the right lobe of the liver resolves on the provided portal venous phase imaging. This finding is associated with hypertrophy of the hepatic arterial system. A minimal amount of beaded irregularity is noted within the distal aspect of a anterior branch of the right hepatic artery (representative coronal images 48 and 49, series 5). While a discrete AV malformation is not identified, early shunting is noted through the left lobe of the liver.  Punctate (approximately 0.7 cm) hypoattenuating lesion within the subcapsular aspect of the left lobe of the liver (coronal image 21, series 5) is too small to adequately characterize though may represent a hepatic cyst. No additional discrete hepatic lesions identified.  Normal appearance of the gallbladder. No radiopaque gallstones. No intra extrahepatic  biliary duct dilatation. No ascites.  There is symmetric enhancement and excretion of the bilateral kidneys. No definite renal stones in this postcontrast examination. No discrete renal lesions. No urinary obstruction or perinephric stranding. Normal appearance of the bilateral adrenal glands, pancreas and spleen. No perisplenic stranding.  Moderate  colonic stool burden without evidence of enteric obstruction. The bowel is normal in course and caliber without wall thickening. The appendix is not visualized, however there is no pericecal inflammatory change. No pneumoperitoneum, pneumatosis or portal venous gas. Small hiatal hernia.  Scattered mixed calcified and noncalcified atherosclerotic plaque within a normal caliber abdominal aorta. The major branch vessels of the abdominal aorta appear patent on this non CTA examination. Note is made of a accessory right renal artery which supplies the inferior pole the right kidney. Incidental note is made of a circumaortic left renal vein.  No retroperitoneal, mesenteric, pelvic or inguinal lymphadenopathy.  Scattered calcifications within a normal sized prostate gland. Normal appearance of the urinary bladder given degree distention. No free fluid in the pelvic cul-de-sac.  No acute or aggressive osseous abnormalities within the abdomen or pelvis. 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 Mild rotatory scoliotic curvature of the thoracolumbar spine with dominant caudal component convex to the left.  Regional soft tissues appear normal.  IMPRESSION: Chest Impression:  1. 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 This large pulmonary AV malformation is without associated enlargement of the main pulmonary artery or definitive cardiomegaly. Abdomen and pelvis Impression:  1. No acute findings within the abdomen or  pelvis. 2. Marked heterogeneous perfusion of the right lobe of the liver. 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 This finding is without associated stigmata of portal venous hypertension, specifically, there is no splenomegaly, ascites or definitive gastroesophageal varices and thus these findings are of uncertain clinical significance. Clinical correlation with LFTs is recommended. 3. 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   Electronically Signed   By: Sandi Mariscal M.D.   On: 05/14/2015 00:04    Scheduled Meds: . dexamethasone  4 mg Intravenous 4 times per day  . levETIRAcetam  500 mg Oral BID  . levothyroxine  125 mcg Oral QAC breakfast  . magnesium oxide  200 mg Oral QHS  . pantoprazole  40 mg Oral Daily  . sodium chloride  3 mL Intravenous Q12H  . tamsulosin  0.4 mg Oral QPC breakfast  . vitamin C  2,000 mg Oral Daily   Continuous Infusions:      Time spent: 35 minutes    [facility] A  [facility] Pager [phone] If 7PM-7AM, please contact night-coverage at [email or web address], password [facility] 05/14/2015, 2:26 PM  LOS: 1 day

## 2015-05-14 NOTE — Telephone Encounter (Signed)
Spoke with the pt's spouse, [name]  She states that the pt is currently admitted to hospital  They found a mass on his brain and are unsure at this point if it is malignant  She is requesting to speak to SN if he has time  She is wondering who he would rec as a Psychologist, sport and exercise if this is needed Please advise, thanks

## 2015-05-15 ENCOUNTER — Other Ambulatory Visit: Payer: Self-pay | Admitting: Neurosurgery

## 2015-05-15 LAB — BASIC METABOLIC PANEL
Anion gap: 11 (ref 5–15)
BUN: 17 mg/dL (ref 6–20)
CALCIUM: 8.8 mg/dL — AB (ref 8.9–10.3)
CO2: 23 mmol/L (ref 22–32)
CREATININE: 1 mg/dL (ref 0.61–1.24)
Chloride: 100 mmol/L — ABNORMAL LOW (ref 101–111)
Glucose, Bld: 108 mg/dL — ABNORMAL HIGH (ref 65–99)
Potassium: 3.9 mmol/L (ref 3.5–5.1)
Sodium: 134 mmol/L — ABNORMAL LOW (ref 135–145)

## 2015-05-15 NOTE — Telephone Encounter (Signed)
SN called pt this morning and discussed options and surgeons. Nothing further is needed.

## 2015-05-15 NOTE — Telephone Encounter (Signed)
Pt would like a phone call today.  [phone]

## 2015-05-15 NOTE — Progress Notes (Signed)
I spoke with the patient last night regarding imaging findings thus far. At this point, he is unable to get MRI due to a nasal implant place in 1986 which is almost certainly not MRI compatible. His CT c/a/p was negative for any identifiable primary tumor. The left ganglionic lesion therefore is most likely to represent primary brain tumor, although metastasis is still possible. Although less likely, brain abscess is at least possible especially given his history of O-W-R and his pulmonary AVM. I spoke with them about the need for tissue diagnosis via stereotactic brain biopsy. Risks of the procedure were reviewed, including brain bleeding, infection, SZ, and non-diagnostic tissue. They appeared to understand our discussion and all questions were answered.   They stated they have a friend who was treated by Dr. Annette Stable and would like to speak with him regarding the situation. I have spoken to Dr. Annette Stable who has agreed to speak to the patient and his wife today.  We will likely plan on biopsy tomorrow.

## 2015-05-15 NOTE — Progress Notes (Signed)
No issues overnight. Pt doing well, reporting improvement in speech and right leg weakness.  EXAM:  BP 107/56 mmHg  Pulse 60  Temp(Src) 98.2 F (36.8 C) (Oral)  Resp 15  Ht 5\' 10"  (1.778 m)  Wt 82.4 kg (181 lb 10.5 oz)  BMI 26.07 kg/m2  SpO2 94%  Awake, alert, oriented  Speech fluent, appropriate  CN grossly intact  5/5 BUE/BLE   IMPRESSION:  74 y.o. male with left ganglion lesion, primary tumor v metastasis v infection  PLAN: - Proceed with stereotactic brain biopsy tomorrow afternoon - NPO after 7a

## 2015-05-15 NOTE — Progress Notes (Signed)
[facility] PROGRESS NOTE   Gregory [name] OZD:[id number] DOB: Apr 30, 1941 DOA: 05/13/2015 PCP: Noralee Space, MD  HPI/Subjective: Right leg weakness improved slightly after starting the steroids. Neurosurgery recommendation noted, likely for brain biopsy in the morning, will keep it step down.  Assessment/Plan: Principal Problem:   Brain mass Active Problems:   Hypothyroidism   HYPERCHOLESTEROLEMIA, MILD   OSLER-WEBER-RENDU DISEASE   GERD   Nocturnal leg cramps   Hypoxemia   Right sided weakness    Brain mass Presented with headache and right lower extremity weakness. CT showed 32 cm brain mass with edema and slight midline shift. Neurosurgery consulted and recommended steroids and metastatic cancer workup. CT of abdomen pelvis was done and showed no suspicious lesion for primary cancer. PSA is normal, normal colonoscopy in March 2012. Per neurosurgery note, brain biopsy in a.m. Keep nothing by mouth past midnight.  Hyponatremia Presented with sodium of 134 and sodium today is 129. 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 Also pulmonary AVMs.  Right leg weakness Secondary to brain mass, improved slightly after initiation of steroids.  Hypothyroidism Continue Synthroid, TSH was 4.38 on 04/03/2015   Code Status: Full Code Family Communication: Plan discussed with the patient. Disposition Plan: Remains inpatient Diet: Diet regular Room service appropriate?: Yes; Fluid consistency:: Thin Diet NPO time specified Except for: Sips with Meds  Consultants:  None  Procedures:  None  Antibiotics:  None   Objective: Filed Vitals:   05/15/15 0828  BP: 107/56  Pulse: 60  Temp: 97.9 F (36.6 C)  Resp: 15    Intake/Output Summary (Last 24 hours) at  05/15/15 1126 Last data filed at 05/15/15 0900  Gross per 24 hour  Intake 1460.5 ml  Output      0 ml  Net 1460.5 ml   Filed Weights   05/13/15 1815  Weight: 82.4 kg (181 lb 10.5 oz)    Exam: General: Alert and awake, oriented x3, not in any acute distress. HEENT: anicteric sclera, pupils reactive to light and accommodation, EOMI CVS: S1-S2 clear, no murmur rubs or gallops Chest: clear to auscultation bilaterally, no wheezing, rales or rhonchi Abdomen: soft nontender, nondistended, normal bowel sounds, no organomegaly Extremities: no cyanosis, clubbing or edema noted bilaterally Neuro: Cranial nerves II-XII intact, no focal neurological deficits  Data Reviewed: Basic Metabolic Panel:  Recent Labs Lab 05/13/15 1201 05/14/15 0319 05/15/15 0229  NA 134* 129* 134*  K 4.5 4.5 3.9  CL 99* 97* 100*  CO2 26 21* 23  GLUCOSE 94 126* 108*  BUN 11 11 17   CREATININE 1.07 0.95 1.00  CALCIUM 9.2 9.0 8.8*   Liver Function Tests:  Recent Labs Lab 05/13/15 1201  AST 24  ALT 16*  ALKPHOS 60  BILITOT 0.8  PROT 6.6  ALBUMIN 3.9   No results for input(s): LIPASE, AMYLASE in the last 168 hours. No results for input(s): AMMONIA in the last 168 hours. CBC:  Recent Labs Lab 05/13/15 1201 05/14/15 0319  WBC 10.2 8.9  NEUTROABS 6.0  --   HGB 15.5 15.6  HCT 44.6 44.8  MCV 87.8 87.5  PLT 244 248   Cardiac Enzymes: No results for input(s): CKTOTAL, CKMB, CKMBINDEX, TROPONINI in the last 168 hours. BNP (last 3 results) No results for input(s): BNP in the last 8760 hours.  ProBNP (last 3 results)  No results for input(s): PROBNP in the last 8760 hours.  CBG: No results for input(s): GLUCAP in the last 168 hours.  Micro No results found for this or any previous visit (from the past 240 hour(s)).   Studies: Ct Head Wo Contrast  05/13/2015   ADDENDUM REPORT: 05/13/2015 15:27  ADDENDUM: There are small surgical clips within or near the posterior wall LEFT maxillary  sinus/LEFT pterygopalatine fossa, in this patient with history of nose bleed. These are stable from prior scan 01/03/2003 and from 09/19/2008.   Electronically Signed   By: Rolla Flatten M.D.   On: 05/13/2015 15:27   05/13/2015   CLINICAL DATA:  RIGHT arm and leg weakness which began slightly over 24 hr ago.  EXAM: CT HEAD WITHOUT CONTRAST  TECHNIQUE: Contiguous axial images were obtained from the base of the skull through the vertex without intravenous contrast.  COMPARISON:  09/19/2008.  FINDINGS: There is a suspected intra-axial brain mass, with its epicenter LEFT lentiform nucleus, estimated cross-section 20 x 30 mm as seen on image 16 series 2. Fit Peripheral hyper attenuating rim, with low-attenuation central component. Marked surrounding vasogenic edema. Early left-to-right shift of 3 mm. Concern raised for primary or metastatic tumor. Abscess considered less likely. Calvarium intact. No sinus or mastoid disease.  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 Moderately extensive surrounding edema.  Findings discussed with triage nursing staff, Ria Comment. The patient will be escorted from the ED waiting room into an area where acute care can be provided.  Electronically Signed: By: Rolla Flatten M.D. On: 05/13/2015 13:10   Ct Head W Contrast  05/14/2015   CLINICAL DATA:  One-day history of RIGHT-sided weakness, mild headache. History of AVM, Osler-Weber-Rendu.  EXAM: CT HEAD WITH CONTRAST  TECHNIQUE: Contiguous axial images were obtained from the base of the skull through the vertex with intravenous contrast.  CONTRAST:  134mL OMNIPAQUE IOHEXOL 300 MG/ML  SOLN  COMPARISON:  CT head May 13, 2015 at 1301 hours and CT head September 19, 2008  FINDINGS: Stage irregular peripherally enhancing, centrally hypodense 3.1 x 1.9 cm mass in the LEFT corona radiata, basal ganglia shows extensive surrounding low-density vasogenic edema, resulting in 4 mm LEFT-to-RIGHT midline shift, similar. Partially  effaced LEFT lateral ventricle without entrapment. No satellite areas of suspicious enhancement.  No acute large vascular territory infarcts. No abnormal extra-axial fluid collections, suspicious extra-axial enhancement or extra-axial masses. Basal cisterns are patent.  Ocular globes and orbital contents are unremarkable. No destructive bony lesions. Trace paranasal sinus mucosal thickening without air-fluid levels. The mastoid air cells are well aerated.  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  Stable 4 mm LEFT-to-RIGHT midline shift.   Electronically Signed   By: Elon Alas M.D.   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  CONTRAST:  119mL OMNIPAQUE IOHEXOL 300 MG/ML  SOLN  COMPARISON:  None.  FINDINGS: CT CHEST FINDINGS  There is a large multilobulated at least 7.2 x 3.8 x 3.4 cm AV  malformation within right lower lobe (representative coronal image 73, series 5, axial image 50, series 2) which is supplied by a hypertrophied segmental branch of the right lower lobe pulmonary artery and draining via a hypertrophy right lower lobe pulmonary vein. This finding is without associated enlargement of the caliber the main pulmonary artery or significant cardiomegaly. There is no definitive mural thrombus within the AV malformation.  Minimal dependent subpleural ground-glass atelectasis. No focal airspace opacities. No  pleural effusion or pneumothorax. The central pulmonary airways appear widely patent.  There is minimal biapical pleural parenchymal thickening. No discrete pulmonary nodules.  No mediastinal, hilar or axillary lymphadenopathy.  No acute or aggressive osseous abnormalities within the chest.  Regional soft tissues appear normal. The thyroid gland appears diminutive.  Although this examination was not tailored for the evaluation the pulmonary arteries, there are no discrete filling defects within the central pulmonary arterial tree to suggest central pulmonary embolism.  Normal caliber of the thoracic aorta. Conventional configuration of the aortic arch. The branch vessels of the aortic arch appear widely patent throughout their imaged course. No definite thoracic aortic dissection or periaortic stranding on this nongated examination.  CT ABDOMEN AND PELVIS FINDINGS  Normal hepatic contour. Heterogeneous enhancement of the majority of the right lobe of the liver resolves on the provided portal venous phase imaging. This finding is associated with hypertrophy of the hepatic arterial system. A minimal amount of beaded irregularity is noted within the distal aspect of a anterior branch of the right hepatic artery (representative coronal images 48 and 49, series 5). While a discrete AV malformation is not identified, early shunting is noted through the left lobe of the liver.  Punctate (approximately 0.7 cm) hypoattenuating lesion within the subcapsular aspect of the left lobe of the liver (coronal image 21, series 5) is too small to adequately characterize though may represent a hepatic cyst. No additional discrete hepatic lesions identified.  Normal appearance of the gallbladder. No radiopaque gallstones. No intra extrahepatic biliary duct dilatation. No ascites.  There is symmetric enhancement and excretion of the bilateral kidneys. No definite renal stones in this postcontrast examination. No discrete renal lesions.  No urinary obstruction or perinephric stranding. Normal appearance of the bilateral adrenal glands, pancreas and spleen. No perisplenic stranding.  Moderate colonic stool burden without evidence of enteric obstruction. The bowel is normal in course and caliber without wall thickening. The appendix is not visualized, however there is no pericecal inflammatory change. No pneumoperitoneum, pneumatosis or portal venous gas. Small hiatal hernia.  Scattered mixed calcified and noncalcified atherosclerotic plaque within a normal caliber abdominal aorta. The major branch vessels of the abdominal aorta appear patent on this non CTA examination. Note is made of a accessory right renal artery which supplies the inferior pole the right kidney. Incidental note is made of a circumaortic left renal vein.  No retroperitoneal, mesenteric, pelvic or inguinal lymphadenopathy.  Scattered calcifications within a normal sized prostate gland. Normal appearance of the urinary bladder given degree distention. No free fluid in the pelvic cul-de-sac.  No acute or aggressive osseous abnormalities within the abdomen or pelvis. 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 Mild rotatory scoliotic curvature of the thoracolumbar spine with dominant caudal component convex to the left.  Regional soft tissues appear normal.  IMPRESSION: Chest Impression:  1. 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 This large pulmonary AV malformation is  without associated enlargement of the main pulmonary artery or definitive cardiomegaly. Abdomen and pelvis Impression:  1. No acute findings within the abdomen or pelvis. 2. Marked heterogeneous perfusion of the right lobe of the liver. 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 This finding is without associated stigmata of portal venous hypertension, specifically, there is no splenomegaly, ascites or definitive gastroesophageal varices and thus these findings are of uncertain clinical significance. Clinical correlation with LFTs is recommended. 3. 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   Electronically Signed   By: Sandi Mariscal M.D.   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  CONTRAST:  179mL OMNIPAQUE IOHEXOL 300 MG/ML  SOLN  COMPARISON:  None.  FINDINGS: CT CHEST FINDINGS  There is a large multilobulated at least 7.2 x 3.8 x 3.4 cm AV malformation within right lower lobe (representative coronal image 73, series 5, axial image 50, series 2) which is supplied by a hypertrophied segmental branch of the right lower lobe pulmonary artery and draining via a hypertrophy right lower lobe pulmonary vein. This finding is without associated enlargement of the caliber the main pulmonary artery or significant cardiomegaly. There is  no definitive mural thrombus within the AV malformation.  Minimal dependent subpleural ground-glass atelectasis. No focal airspace opacities. No pleural effusion or pneumothorax. The central pulmonary airways appear widely patent.  There is minimal biapical pleural parenchymal thickening. No discrete pulmonary nodules.  No mediastinal, hilar or axillary lymphadenopathy.  No acute or aggressive osseous abnormalities within the chest.  Regional soft tissues appear normal. The thyroid gland appears diminutive.  Although this examination was not tailored for the evaluation the pulmonary arteries, there are no discrete filling defects within the central pulmonary arterial tree to suggest central pulmonary embolism.  Normal caliber of the thoracic aorta. Conventional configuration of the aortic arch. The branch vessels of the aortic arch appear widely patent throughout their imaged course. No definite thoracic aortic dissection or periaortic stranding on this nongated examination.  CT ABDOMEN AND PELVIS FINDINGS  Normal hepatic contour. Heterogeneous enhancement of the majority of the right lobe of the liver resolves on the provided portal venous phase imaging. This finding is associated with hypertrophy of the hepatic arterial system. A minimal amount of beaded irregularity is noted within the distal aspect of a anterior branch of the right hepatic artery (representative coronal images 48 and 49, series 5). While a discrete AV malformation is not identified, early shunting is noted through the left lobe of the liver.  Punctate (approximately 0.7 cm) hypoattenuating lesion within the subcapsular  aspect of the left lobe of the liver (coronal image 21, series 5) is too small to adequately characterize though may represent a hepatic cyst. No additional discrete hepatic lesions identified.  Normal appearance of the gallbladder. No radiopaque gallstones. No intra extrahepatic biliary duct dilatation. No ascites.  There is  symmetric enhancement and excretion of the bilateral kidneys. No definite renal stones in this postcontrast examination. No discrete renal lesions. No urinary obstruction or perinephric stranding. Normal appearance of the bilateral adrenal glands, pancreas and spleen. No perisplenic stranding.  Moderate colonic stool burden without evidence of enteric obstruction. The bowel is normal in course and caliber without wall thickening. The appendix is not visualized, however there is no pericecal inflammatory change. No pneumoperitoneum, pneumatosis or portal venous gas. Small hiatal hernia.  Scattered mixed calcified and noncalcified atherosclerotic plaque within a normal caliber abdominal aorta. The major branch vessels of the abdominal aorta appear patent on this non CTA examination. Note is made of a accessory right renal artery which supplies the inferior pole the right kidney. Incidental note is made of a circumaortic left renal vein.  No retroperitoneal, mesenteric, pelvic or inguinal lymphadenopathy.  Scattered calcifications within a normal sized prostate gland. Normal appearance of the urinary bladder given degree distention. No free fluid in the pelvic cul-de-sac.  No acute or aggressive osseous abnormalities within the abdomen or pelvis. 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 Mild rotatory scoliotic curvature of the thoracolumbar spine with dominant caudal component convex to the left.  Regional soft tissues appear normal.  IMPRESSION: Chest Impression:  1. 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 This large pulmonary AV malformation is without associated enlargement of the main pulmonary artery or definitive cardiomegaly. Abdomen and pelvis Impression:  1. No acute findings within the abdomen or pelvis. 2. Marked heterogeneous perfusion of the right lobe of the liver. 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 This finding is without associated stigmata of portal venous hypertension, specifically, there is no splenomegaly, ascites or definitive gastroesophageal varices and thus these findings are of uncertain clinical significance. Clinical correlation with LFTs is recommended. 3. 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   Electronically Signed   By: Sandi Mariscal M.D.   On: 05/14/2015 00:04    Scheduled Meds: . dexamethasone  4 mg Intravenous 4 times per day  . levETIRAcetam  500 mg Oral BID  . levothyroxine  125 mcg Oral QAC breakfast  . magnesium oxide  200 mg Oral QHS  . pantoprazole  40 mg Oral Daily  . sodium chloride  3 mL Intravenous Q12H  . tamsulosin  0.4 mg Oral QPC breakfast  . vitamin C  2,000 mg Oral Daily   Continuous Infusions: . sodium chloride 75 mL/hr at 05/14/15  2300       Time spent: 35 minutes    [facility]  [facility] Pager [phone] If 7PM-7AM, please contact night-coverage at [email or web address], password [facility] 05/15/2015, 11:26 AM  LOS: 2 days

## 2015-05-15 NOTE — Clinical Documentation Improvement (Signed)
Pt. CT HEAD  On 05/13/2015 FINDINGS: There is a suspected intra-axial brain mass, with its epicenter LEFT lentiform nucleus, estimated cross-section 20 x 30 mm as seen on image 16 series 2. Fit Peripheral hyper attenuating rim, with low-attenuation central component. Marked surrounding vasogenic edema. Early left-to-right shift of 3 mm. Concern raised for primary or metastatic tumor. Abscess considered less likely. Calvarium intact. No sinus or mastoid disease.  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 Moderately extensive surrounding edema. Please clarify if possible if pt. Clinical condition    _______________Cerebral edema _______________Vasogenic edema  Other Not able to determine  Risk Factors: Left brain mass Sign & Symptoms: Right arm and leg weakness, headaches,right-sided facial droop H&P notes: found to have a 2 x 3 cm left basal ganglia mass, likely tumor, with extensive surrounding edema and 3 mm of left-to-right shift Diagnostics: CT HEAD  Treatment:Neurosurgery consulted,  Decadron IV 4 mg q6 h Keppra for seizure prophylaxis  Thank you,  Philippa Chester ,RN Clinical Documentation Specialist:  [place] [employer]

## 2015-05-16 ENCOUNTER — Encounter (HOSPITAL_COMMUNITY): Payer: Self-pay | Admitting: Anesthesiology

## 2015-05-16 ENCOUNTER — Inpatient Hospital Stay (HOSPITAL_COMMUNITY): Payer: Medicare Other | Admitting: Anesthesiology

## 2015-05-16 ENCOUNTER — Encounter (HOSPITAL_COMMUNITY)
Admission: EM | Disposition: A | Discharge status: Home Health | Payer: Self-pay | Source: Home / Self Care | Attending: Internal Medicine

## 2015-05-16 HISTORY — PX: APPLICATION OF CRANIAL NAVIGATION: SHX6578

## 2015-05-16 HISTORY — PX: BRAIN BIOPSY: SHX6409

## 2015-05-16 LAB — CBC
HEMATOCRIT: 41.4 % (ref 39.0–52.0)
HEMOGLOBIN: 14.9 g/dL (ref 13.0–17.0)
MCH: 31.6 pg (ref 26.0–34.0)
MCHC: 36 g/dL (ref 30.0–36.0)
MCV: 87.7 fL (ref 78.0–100.0)
Platelets: 231 10*3/uL (ref 150–400)
RBC: 4.72 MIL/uL (ref 4.22–5.81)
RDW: 13.1 % (ref 11.5–15.5)
WBC: 14.4 10*3/uL — ABNORMAL HIGH (ref 4.0–10.5)

## 2015-05-16 LAB — CREATININE, SERUM
Creatinine, Ser: 0.95 mg/dL (ref 0.61–1.24)
GFR calc Af Amer: >60 mL/min (ref >60)
GFR calc non Af Amer: >60 mL/min (ref >60)

## 2015-05-16 LAB — GRAM STAIN

## 2015-05-16 LAB — BASIC METABOLIC PANEL
Anion gap: 6 (ref 5–15)
BUN: 21 mg/dL — AB (ref 6–20)
CO2: 23 mmol/L (ref 22–32)
Calcium: 8.3 mg/dL — ABNORMAL LOW (ref 8.9–10.3)
Chloride: 103 mmol/L (ref 101–111)
Creatinine, Ser: 0.93 mg/dL (ref 0.61–1.24)
GFR calc Af Amer: >60 mL/min (ref >60)
GFR calc non Af Amer: >60 mL/min (ref >60)
Glucose, Bld: 114 mg/dL — ABNORMAL HIGH (ref 65–99)
Potassium: 4.2 mmol/L (ref 3.5–5.1)
SODIUM: 132 mmol/L — AB (ref 135–145)

## 2015-05-16 LAB — MRSA PCR SCREENING: MRSA by PCR: NEGATIVE

## 2015-05-16 SURGERY — BRAIN BIOPSY
Anesthesia: General | Site: Head

## 2015-05-16 MED ORDER — FENTANYL CITRATE (PF) 250 MCG/5ML IJ SOLN
INTRAMUSCULAR | Status: DC | PRN
  Administered 2015-05-16 (×2): 50 ug via INTRAVENOUS
  Administered 2015-05-16: 100 ug via INTRAVENOUS
  Administered 2015-05-16: 50 ug via INTRAVENOUS

## 2015-05-16 MED ORDER — BUPIVACAINE HCL (PF) 0.5 % IJ SOLN
INTRAMUSCULAR | Status: DC | PRN
  Administered 2015-05-16: 4 mL

## 2015-05-16 MED ORDER — PHENYLEPHRINE HCL 10 MG/ML IJ SOLN
10.0000 mg | INTRAVENOUS | Status: DC | PRN
  Administered 2015-05-16: 10 ug/min via INTRAVENOUS

## 2015-05-16 MED ORDER — FENTANYL CITRATE (PF) 250 MCG/5ML IJ SOLN
INTRAMUSCULAR | Status: AC
  Filled 2015-05-16: qty 5

## 2015-05-16 MED ORDER — LIDOCAINE-EPINEPHRINE 1 %-1:100000 IJ SOLN
INTRAMUSCULAR | Status: DC | PRN
  Administered 2015-05-16: 4 mL

## 2015-05-16 MED ORDER — CEFAZOLIN SODIUM-DEXTROSE 2-3 GM-% IV SOLR
INTRAVENOUS | Status: DC | PRN
  Administered 2015-05-16: 2 g via INTRAVENOUS

## 2015-05-16 MED ORDER — BACITRACIN ZINC 500 UNIT/GM EX OINT
TOPICAL_OINTMENT | CUTANEOUS | Status: DC | PRN
  Administered 2015-05-16 (×2): 1 via TOPICAL

## 2015-05-16 MED ORDER — ONDANSETRON HCL 4 MG/2ML IJ SOLN: 4.0000 mg | INTRAMUSCULAR | Status: DC | PRN

## 2015-05-16 MED ORDER — VANCOMYCIN HCL 10 G IV SOLR
1250.0000 mg | Freq: Two times a day (BID) | INTRAVENOUS | Status: DC
  Administered 2015-05-16 – 2015-05-21 (×10): 1250 mg via INTRAVENOUS
  Filled 2015-05-16 (×14): qty 1250

## 2015-05-16 MED ORDER — HEPARIN SODIUM (PORCINE) 5000 UNIT/ML IJ SOLN
5000.0000 [IU] | Freq: Three times a day (TID) | INTRAMUSCULAR | Status: DC
  Administered 2015-05-17 – 2015-05-21 (×12): 5000 [IU] via SUBCUTANEOUS
  Filled 2015-05-16 (×13): qty 1

## 2015-05-16 MED ORDER — ONDANSETRON HCL 4 MG PO TABS: 4.0000 mg | ORAL_TABLET | ORAL | Status: DC | PRN

## 2015-05-16 MED ORDER — PROMETHAZINE HCL 25 MG PO TABS: 12.5000 mg | ORAL_TABLET | ORAL | Status: DC | PRN

## 2015-05-16 MED ORDER — METRONIDAZOLE 500 MG PO TABS
500.0000 mg | ORAL_TABLET | Freq: Three times a day (TID) | ORAL | Status: DC
  Administered 2015-05-16 – 2015-05-21 (×14): 500 mg via ORAL
  Filled 2015-05-16 (×14): qty 1

## 2015-05-16 MED ORDER — PHENYLEPHRINE 40 MCG/ML (10ML) SYRINGE FOR IV PUSH (FOR BLOOD PRESSURE SUPPORT)
PREFILLED_SYRINGE | INTRAVENOUS | Status: AC
  Filled 2015-05-16: qty 10

## 2015-05-16 MED ORDER — ONDANSETRON HCL 4 MG/2ML IJ SOLN
INTRAMUSCULAR | Status: AC
  Filled 2015-05-16: qty 4

## 2015-05-16 MED ORDER — THROMBIN 20000 UNITS EX SOLR
CUTANEOUS | Status: DC | PRN
  Administered 2015-05-16: 16:00:00 via TOPICAL

## 2015-05-16 MED ORDER — 0.9 % SODIUM CHLORIDE (POUR BTL) OPTIME
TOPICAL | Status: DC | PRN
  Administered 2015-05-16 (×2): 1000 mL

## 2015-05-16 MED ORDER — LABETALOL HCL 5 MG/ML IV SOLN
10.0000 mg | INTRAVENOUS | Status: DC | PRN
  Filled 2015-05-16: qty 8

## 2015-05-16 MED ORDER — PROMETHAZINE HCL 25 MG/ML IJ SOLN: 6.2500 mg | INTRAMUSCULAR | Status: DC | PRN

## 2015-05-16 MED ORDER — FENTANYL CITRATE (PF) 100 MCG/2ML IJ SOLN: 25.0000 ug | INTRAMUSCULAR | Status: DC | PRN

## 2015-05-16 MED ORDER — ROCURONIUM BROMIDE 100 MG/10ML IV SOLN
INTRAVENOUS | Status: DC | PRN
  Administered 2015-05-16 (×2): 10 mg via INTRAVENOUS
  Administered 2015-05-16: 40 mg via INTRAVENOUS

## 2015-05-16 MED ORDER — EPHEDRINE SULFATE 50 MG/ML IJ SOLN
INTRAMUSCULAR | Status: AC
  Filled 2015-05-16: qty 1

## 2015-05-16 MED ORDER — PROPOFOL 10 MG/ML IV BOLUS
INTRAVENOUS | Status: AC
  Filled 2015-05-16: qty 20

## 2015-05-16 MED ORDER — NEOSTIGMINE METHYLSULFATE 10 MG/10ML IV SOLN
INTRAVENOUS | Status: DC | PRN
  Administered 2015-05-16: 5 mg via INTRAVENOUS

## 2015-05-16 MED ORDER — ROCURONIUM BROMIDE 50 MG/5ML IV SOLN
INTRAVENOUS | Status: AC
  Filled 2015-05-16: qty 2

## 2015-05-16 MED ORDER — STERILE WATER FOR INJECTION IJ SOLN
INTRAMUSCULAR | Status: AC
  Filled 2015-05-16: qty 10

## 2015-05-16 MED ORDER — MEPERIDINE HCL 25 MG/ML IJ SOLN: 6.2500 mg | INTRAMUSCULAR | Status: DC | PRN

## 2015-05-16 MED ORDER — SODIUM CHLORIDE 0.9 % IJ SOLN
INTRAMUSCULAR | Status: AC
  Filled 2015-05-16: qty 10

## 2015-05-16 MED ORDER — ONDANSETRON HCL 4 MG/2ML IJ SOLN
INTRAMUSCULAR | Status: DC | PRN
  Administered 2015-05-16: 4 mg via INTRAVENOUS

## 2015-05-16 MED ORDER — ARTIFICIAL TEARS OP OINT
TOPICAL_OINTMENT | OPHTHALMIC | Status: AC
  Filled 2015-05-16: qty 7

## 2015-05-16 MED ORDER — SODIUM CHLORIDE 0.9 % IR SOLN
Status: DC | PRN
  Administered 2015-05-16: 16:00:00

## 2015-05-16 MED ORDER — LIDOCAINE HCL (CARDIAC) 20 MG/ML IV SOLN
INTRAVENOUS | Status: DC | PRN
  Administered 2015-05-16: 100 mg via INTRAVENOUS

## 2015-05-16 MED ORDER — GLYCOPYRROLATE 0.2 MG/ML IJ SOLN
INTRAMUSCULAR | Status: DC | PRN
  Administered 2015-05-16: 0.6 mg via INTRAVENOUS

## 2015-05-16 MED ORDER — LIDOCAINE HCL (CARDIAC) 20 MG/ML IV SOLN
INTRAVENOUS | Status: AC
  Filled 2015-05-16: qty 5

## 2015-05-16 MED ORDER — SUCCINYLCHOLINE CHLORIDE 20 MG/ML IJ SOLN
INTRAMUSCULAR | Status: AC
  Filled 2015-05-16: qty 1

## 2015-05-16 MED ORDER — PROPOFOL 10 MG/ML IV BOLUS
INTRAVENOUS | Status: DC | PRN
  Administered 2015-05-16: 30 mg via INTRAVENOUS
  Administered 2015-05-16: 140 mg via INTRAVENOUS

## 2015-05-16 MED ORDER — GLYCOPYRROLATE 0.2 MG/ML IJ SOLN
INTRAMUSCULAR | Status: AC
  Filled 2015-05-16: qty 3

## 2015-05-16 MED ORDER — NEOSTIGMINE METHYLSULFATE 10 MG/10ML IV SOLN
INTRAVENOUS | Status: AC
  Filled 2015-05-16: qty 2

## 2015-05-16 MED ORDER — SODIUM CHLORIDE 0.9 % IV SOLN
INTRAVENOUS | Status: DC | PRN
  Administered 2015-05-16: 15:00:00 via INTRAVENOUS

## 2015-05-16 MED ORDER — CEFTRIAXONE SODIUM IN DEXTROSE 40 MG/ML IV SOLN
2.0000 g | Freq: Two times a day (BID) | INTRAVENOUS | Status: DC
  Administered 2015-05-16 – 2015-05-21 (×10): 2 g via INTRAVENOUS
  Filled 2015-05-16 (×12): qty 50

## 2015-05-16 MED ORDER — PHENYLEPHRINE HCL 10 MG/ML IJ SOLN
INTRAMUSCULAR | Status: AC
  Filled 2015-05-16: qty 1

## 2015-05-16 SURGICAL SUPPLY — 96 items
BAG DECANTER FOR FLEXI CONT (MISCELLANEOUS) ×4 IMPLANT
BATTERY IQ STERILE (MISCELLANEOUS) ×3 IMPLANT
BENZOIN TINCTURE PRP APPL 2/3 (GAUZE/BANDAGES/DRESSINGS) IMPLANT
BLADE CLIPPER SURG (BLADE) IMPLANT
BLADE SURG 15 STRL LF DISP TIS (BLADE) IMPLANT
BLADE SURG 15 STRL SS (BLADE)
BLADE ULTRA TIP 2M (BLADE) IMPLANT
BNDG GAUZE ELAST 4 BULKY (GAUZE/BANDAGES/DRESSINGS) IMPLANT
BUR ACORN 6.0 PRECISION (BURR) IMPLANT
BUR ACORN 6.0MM PRECISION (BURR)
BUR ADDG 1.1 (BURR) IMPLANT
BUR ADDG 1.1MM (BURR)
BUR MATCHSTICK NEURO 3.0 LAGG (BURR) IMPLANT
BUR SPIRAL ROUTER 2.3 (BUR) IMPLANT
BUR SPIRAL ROUTER 2.3MM (BUR)
CANISTER SUCT 3000ML PPV (MISCELLANEOUS) ×8 IMPLANT
CLIP TI MEDIUM 6 (CLIP) IMPLANT
CONT SPEC 4OZ CLIKSEAL STRL BL (MISCELLANEOUS) ×12 IMPLANT
CORDS BIPOLAR (ELECTRODE) IMPLANT
COVER MAYO STAND STRL (DRAPES) IMPLANT
DECANTER SPIKE VIAL GLASS SM (MISCELLANEOUS) IMPLANT
DISPOSABLE BIOPSY NEEDLE KIT TYPE A ×4 IMPLANT
DRAIN SNY WOU 7FLT (WOUND CARE) IMPLANT
DRAIN SUBARACHNOID (WOUND CARE) IMPLANT
DRAPE MICROSCOPE LEICA (MISCELLANEOUS) IMPLANT
DRAPE NEUROLOGICAL W/INCISE (DRAPES) ×4 IMPLANT
DRAPE ORTHO SPLIT 77X108 STRL (DRAPES)
DRAPE STERI IOBAN 125X83 (DRAPES) IMPLANT
DRAPE SURG 17X23 STRL (DRAPES) IMPLANT
DRAPE SURG IRRIG POUCH 19X23 (DRAPES) IMPLANT
DRAPE SURG ORHT 6 SPLT 77X108 (DRAPES) IMPLANT
DRAPE WARM FLUID 44X44 (DRAPE) ×4 IMPLANT
DRSG OPSITE POSTOP 3X4 (GAUZE/BANDAGES/DRESSINGS) ×4 IMPLANT
DRSG TELFA 3X8 NADH (GAUZE/BANDAGES/DRESSINGS) ×4 IMPLANT
DURAPREP 6ML APPLICATOR 50/CS (WOUND CARE) ×4 IMPLANT
ELECT CAUTERY BLADE 6.4 (BLADE) IMPLANT
ELECT REM PT RETURN 9FT ADLT (ELECTROSURGICAL) ×4
ELECTRODE REM PT RTRN 9FT ADLT (ELECTROSURGICAL) ×2 IMPLANT
EVACUATOR 1/8 PVC DRAIN (DRAIN) IMPLANT
EVACUATOR SILICONE 100CC (DRAIN) IMPLANT
GAUZE SPONGE 4X4 12PLY STRL (GAUZE/BANDAGES/DRESSINGS) IMPLANT
GAUZE SPONGE 4X4 16PLY XRAY LF (GAUZE/BANDAGES/DRESSINGS) IMPLANT
GLOVE BIOGEL PI IND STRL 7.5 (GLOVE) ×2 IMPLANT
GLOVE BIOGEL PI IND STRL 8 (GLOVE) ×4 IMPLANT
GLOVE BIOGEL PI INDICATOR 7.5 (GLOVE) ×2
GLOVE BIOGEL PI INDICATOR 8 (GLOVE) ×4
GLOVE ECLIPSE 6.5 STRL STRAW (GLOVE) IMPLANT
GLOVE ECLIPSE 7.0 STRL STRAW (GLOVE) ×4 IMPLANT
GLOVE ECLIPSE 7.5 STRL STRAW (GLOVE) ×12 IMPLANT
GLOVE EXAM NITRILE LRG STRL (GLOVE) IMPLANT
GLOVE EXAM NITRILE MD LF STRL (GLOVE) IMPLANT
GLOVE EXAM NITRILE XL STR (GLOVE) IMPLANT
GLOVE EXAM NITRILE XS STR PU (GLOVE) IMPLANT
GOWN STRL REUS W/ TWL LRG LVL3 (GOWN DISPOSABLE) ×2 IMPLANT
GOWN STRL REUS W/ TWL XL LVL3 (GOWN DISPOSABLE) IMPLANT
GOWN STRL REUS W/TWL 2XL LVL3 (GOWN DISPOSABLE) ×4 IMPLANT
GOWN STRL REUS W/TWL LRG LVL3 (GOWN DISPOSABLE) ×2
GOWN STRL REUS W/TWL XL LVL3 (GOWN DISPOSABLE)
HEMOSTAT SURGICEL 2X14 (HEMOSTASIS) IMPLANT
KIT BASIN OR (CUSTOM PROCEDURE TRAY) ×4 IMPLANT
KIT ROOM TURNOVER OR (KITS) ×4 IMPLANT
MARKER SPHERE PSV REFLC 13MM (MARKER) ×12 IMPLANT
MICROFIXATION BATTERY ×4 IMPLANT
NEEDLE HYPO 25X1 1.5 SAFETY (NEEDLE) ×4 IMPLANT
NEEDLE SPNL 18GX3.5 QUINCKE PK (NEEDLE) IMPLANT
NS IRRIG 1000ML POUR BTL (IV SOLUTION) ×4 IMPLANT
PACK CRANIOTOMY (CUSTOM PROCEDURE TRAY) ×4 IMPLANT
PATTIES SURGICAL .25X.25 (GAUZE/BANDAGES/DRESSINGS) IMPLANT
PATTIES SURGICAL .5 X.5 (GAUZE/BANDAGES/DRESSINGS) IMPLANT
PATTIES SURGICAL .5 X3 (DISPOSABLE) IMPLANT
PATTIES SURGICAL 1/4 X 3 (GAUZE/BANDAGES/DRESSINGS) IMPLANT
PATTIES SURGICAL 1X1 (DISPOSABLE) IMPLANT
PERFORATOR LRG  14-11MM (BIT) ×2
PERFORATOR LRG 14-11MM (BIT) ×2 IMPLANT
PLATE 1.5/0.5 18.5MM BURR HOLE (Plate) ×4 IMPLANT
RUBBERBAND STERILE (MISCELLANEOUS) IMPLANT
SCREW SELF DRILL HT 1.5/4MM (Screw) ×16 IMPLANT
SPECIMEN JAR SMALL (MISCELLANEOUS) IMPLANT
SPONGE NEURO XRAY DETECT 1X3 (DISPOSABLE) IMPLANT
SPONGE SURGIFOAM ABS GEL 100 (HEMOSTASIS) ×4 IMPLANT
STAPLER VISISTAT 35W (STAPLE) ×4 IMPLANT
SUT ETHILON 3 0 FSL (SUTURE) IMPLANT
SUT ETHILON 3 0 PS 1 (SUTURE) IMPLANT
SUT NURALON 4 0 TR CR/8 (SUTURE) ×4 IMPLANT
SUT SILK 0 TIES 10X30 (SUTURE) IMPLANT
SUT VIC AB 0 CT1 18XCR BRD8 (SUTURE) ×2 IMPLANT
SUT VIC AB 0 CT1 8-18 (SUTURE) ×2
SUT VIC AB 2-0 CT2 18 VCP726D (SUTURE) ×4 IMPLANT
SUT VIC AB 3-0 SH 8-18 (SUTURE) ×4 IMPLANT
SYR 20ML ECCENTRIC (SYRINGE) ×4 IMPLANT
SYR CONTROL 10ML LL (SYRINGE) ×4 IMPLANT
TOWEL OR 17X24 6PK STRL BLUE (TOWEL DISPOSABLE) ×4 IMPLANT
TOWEL OR 17X26 10 PK STRL BLUE (TOWEL DISPOSABLE) ×4 IMPLANT
TRAY FOLEY W/METER SILVER 14FR (SET/KITS/TRAYS/PACK) IMPLANT
UNDERPAD 30X30 INCONTINENT (UNDERPADS AND DIAPERS) ×4 IMPLANT
WATER STERILE IRR 1000ML POUR (IV SOLUTION) ×4 IMPLANT

## 2015-05-16 NOTE — Progress Notes (Signed)
No issues overnight. Pt has no complaints. Ready to proceed with biopsy.  EXAM:  BP 108/52 mmHg  Pulse 65  Temp(Src) 97.8 F (36.6 C) (Oral)  Resp 15  Ht 5\' 10"  (1.778 m)  Wt 82.4 kg (181 lb 10.5 oz)  BMI 26.07 kg/m2  SpO2 93%  Awake, alert, oriented  Speech fluent, appropriate  CN grossly intact  5/5 BUE/BLE   IMPRESSION:  74 y.o. male with left ganglionic lesion  PLAN: - Proceed with left frontal stereotactic biopsy  I have discussed in detail the procedure, its indications, risks, and benefits with the patient and his wife. All questions were answered.

## 2015-05-16 NOTE — Progress Notes (Signed)
[facility] PROGRESS NOTE   Gregory [name] RWE:[id number] DOB: [birth year] DOA: 05/13/2015 PCP: Noralee Space, MD  HPI/Subjective: No changes clinically since yesterday. Neurosurgery following, for brain biopsy today. Continue steroids.  Assessment/Plan: Principal Problem:   Brain mass Active Problems:   Hypothyroidism   HYPERCHOLESTEROLEMIA, MILD   OSLER-WEBER-RENDU DISEASE   GERD   Nocturnal leg cramps   Hypoxemia   Right sided weakness    Brain mass Presented with headache and right lower extremity weakness. CT showed 32 cm brain mass with edema and slight midline shift. Neurosurgery consulted and recommended steroids and metastatic cancer workup. CT of abdomen pelvis was done and showed no suspicious lesion for primary cancer. PSA is normal, normal colonoscopy in March 2012. Brain biopsy scheduled later today.  Hyponatremia Presented with sodium of 134 and sodium today is 129. 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 Also pulmonary AVMs.  Right leg weakness Secondary to brain mass, improved slightly after initiation of steroids.  Hypothyroidism Continue Synthroid, TSH was 4.38 on 04/03/2015   Code Status: Full Code Family Communication: Plan discussed with the patient. Disposition Plan: Remains inpatient Diet: Diet NPO time specified  Consultants:  None  Procedures:  None  Antibiotics:  None   Objective: Filed Vitals:   05/16/15 1215  BP:   Pulse:   Temp: 97.8 F (36.6 C)  Resp:     Intake/Output Summary (Last 24 hours) at 05/16/15 1224 Last data filed at 05/15/15 1900  Gross per 24 hour  Intake   1470 ml  Output      0 ml  Net   1470 ml   Filed Weights   05/13/15 1815  Weight: 82.4 kg (181  lb 10.5 oz)    Exam: General: Alert and awake, oriented x3, not in any acute distress. HEENT: anicteric sclera, pupils reactive to light and accommodation, EOMI CVS: S1-S2 clear, no murmur rubs or gallops Chest: clear to auscultation bilaterally, no wheezing, rales or rhonchi Abdomen: soft nontender, nondistended, normal bowel sounds, no organomegaly Extremities: no cyanosis, clubbing or edema noted bilaterally Neuro: Cranial nerves II-XII intact, no focal neurological deficits  Data Reviewed: Basic Metabolic Panel:  Recent Labs Lab 05/13/15 1201 05/14/15 0319 05/15/15 0229 05/16/15 0320  NA 134* 129* 134* 132*  K 4.5 4.5 3.9 4.2  CL 99* 97* 100* 103  CO2 26 21* 23 23  GLUCOSE 94 126* 108* 114*  BUN 11 11 17  21*  CREATININE 1.07 0.95 1.00 0.93  CALCIUM 9.2 9.0 8.8* 8.3*   Liver Function Tests:  Recent Labs Lab 05/13/15 1201  AST 24  ALT 16*  ALKPHOS 60  BILITOT 0.8  PROT 6.6  ALBUMIN 3.9   No results for input(s): LIPASE, AMYLASE in the last 168 hours. No results for input(s): AMMONIA in the last 168 hours. CBC:  Recent Labs Lab 05/13/15 1201 05/14/15 0319  WBC 10.2 8.9  NEUTROABS 6.0  --   HGB 15.5 15.6  HCT 44.6 44.8  MCV 87.8 87.5  PLT 244 248   Cardiac Enzymes: No results for input(s): CKTOTAL, CKMB, CKMBINDEX, TROPONINI in the last 168 hours. BNP (last 3 results) No results for input(s): BNP in the last 8760 hours.  ProBNP (last 3 results) No results for input(s): PROBNP in the last 8760 hours.  CBG: No results  for input(s): GLUCAP in the last 168 hours.  Micro No results found for this or any previous visit (from the past 240 hour(s)).   Studies: No results found.  Scheduled Meds: . dexamethasone  4 mg Intravenous 4 times per day  . levETIRAcetam  500 mg Oral BID  . levothyroxine  125 mcg Oral QAC breakfast  . magnesium oxide  200 mg Oral QHS  . pantoprazole  40 mg Oral Daily  . sodium chloride  3 mL Intravenous Q12H  . tamsulosin   0.4 mg Oral QPC breakfast  . vitamin C  2,000 mg Oral Daily   Continuous Infusions: . sodium chloride 75 mL/hr at 05/14/15 2300       Time spent: 35 minutes    [facility]  [facility] Pager [phone] If 7PM-7AM, please contact night-coverage at [email or web address], password [facility] 05/16/2015, 12:24 PM  LOS: 3 days

## 2015-05-16 NOTE — Transfer of Care (Signed)
Immediate Anesthesia Transfer of Care Note  Patient: Gregory [name]  Procedure(s) Performed: Procedure(s) with comments: Stereotactic Left Brain Biopsy with Brain Lab (Left) - Stereotactic Left Brain biopsy with brainlab APPLICATION OF CRANIAL NAVIGATION (N/A)  Patient Location: PACU  Anesthesia Type:General  Level of Consciousness: awake  Airway & Oxygen Therapy: Patient Spontanous Breathing and Patient connected to face mask oxygen  Post-op Assessment: Report given to RN and Post -op Vital signs reviewed and stable  Post vital signs: Reviewed and stable  Last Vitals:  Filed Vitals:   05/16/15 1215  BP:   Pulse:   Temp: 36.6 C  Resp:     Complications: No apparent anesthesia complications

## 2015-05-16 NOTE — Progress Notes (Signed)
ANTIBIOTIC CONSULT NOTE - INITIAL  Pharmacy Consult for vanc  Indication: Brain abscess?  Allergies  Allergen Reactions  . Aspirin Other (See Comments)    nosebleeds  . Statins Other (See Comments)    intolerance  . Zocor [Simvastatin - High Dose] Other (See Comments)    Leg cramps    Patient Measurements: Height: 5\' 10"  (177.8 cm) Weight: 181 lb 10.5 oz (82.4 kg) IBW/kg (Calculated) : 73 Adjusted Body Weight:   Vital Signs: Temp: 97.7 F (36.5 C) (05/27 1800) Temp Source: Oral (05/27 1215) BP: 123/54 mmHg (05/27 1815) Pulse Rate: 57 (05/27 1815) Intake/Output from previous day: 05/26 0701 - 05/27 0700 In: 1815 [P.O.:840; I.V.:975] Out: -  Intake/Output from this shift: Total I/O In: 1200 [I.V.:1200] Out: -   Labs:  Recent Labs  05/14/15 0319 05/15/15 0229 05/16/15 0320  WBC 8.9  --   --   HGB 15.6  --   --   PLT 248  --   --   CREATININE 0.95 1.00 0.93   Estimated Creatinine Clearance: 74.1 mL/min (by C-G formula based on Cr of 0.93). No results for input(s): VANCOTROUGH, VANCOPEAK, VANCORANDOM, GENTTROUGH, GENTPEAK, GENTRANDOM, TOBRATROUGH, TOBRAPEAK, TOBRARND, AMIKACINPEAK, AMIKACINTROU, AMIKACIN in the last 72 hours.   Microbiology: Recent Results (from the past 720 hour(s))  Gram stain     Status: None   Collection Time: 05/16/15  4:30 PM  Result Value Ref Range Status   Specimen Description ABSCESS  Final   Special Requests LEFT FRONTAL BRAIN MASS  Final   Gram Stain   Final    MODERATE WBC PRESENT,BOTH PMN AND MONONUCLEAR ABUNDANT GRAM POSITIVE COCCI IN PAIRS IN CHAINS FEW GRAM POSITIVE RODS    Report Status 05/16/2015 FINAL  Final    Medical History: Past Medical History  Diagnosis Date  . Other diseases of nasal cavity and sinuses(478.19)   . Pure hypercholesterolemia   . Unspecified hypothyroidism   . Irritable bowel syndrome   . Benign neoplasm of colon   . Hypertrophy of prostate with urinary obstruction and other lower urinary  tract symptoms (LUTS)   . Lumbago   . Plantar fascial fibromatosis   . Anxiety state, unspecified   . Persistent disorder of initiating or maintaining sleep   . GERD (gastroesophageal reflux disease)   . Arthritis   . 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 History of nonmelanoma skin cancer EXCISION SQUAMOUS CELL FROM HAND  . 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 cefTRIAXone (ROCEPHIN)  IV  2 g Intravenous Q12H  . dexamethasone  4 mg Intravenous 4 times per day  . levETIRAcetam  500 mg Oral BID  . levothyroxine  125 mcg Oral QAC breakfast  . magnesium oxide  200 mg Oral QHS  . metroNIDAZOLE  500 mg Oral 3 times per day  . pantoprazole  40 mg Oral Daily  . sodium chloride  3 mL Intravenous Q12H  . tamsulosin  0.4 mg Oral QPC breakfast  . vitamin C  2,000 mg Oral Daily   Infusions:  . sodium chloride 75 mL/hr at 05/14/15 2300   Assessment: 74 yo who came in with right side deficits and found to have a brain mass. A brain bx was done today and found to have left frontal abscess. There vanc/rocephin/flagyl were ordered. Pt has  not received vanc while here.   Goal of Therapy:  Vancomycin trough level 15-20 mcg/ml  Plan:   Vanc 1.25g IV q12 Trough at steady state Rocephin 2g IV q12 Flagyl 500mg  IV q8  Onnie Boer, PharmD Pager: [phone] 05/16/2015 6:39 PM

## 2015-05-16 NOTE — Progress Notes (Signed)
   05/16/15 1058  Clinical Encounter Type  Visited With Patient and family together  Visit Type Initial;Spiritual support;Social support  Referral From Nurse  Consult/Referral To [place] referred by RN to visit with patient prior to surgery/biopsy this afternoon.  Patient and spouse were very receptive. Patient prefers to be called Gregory [name].  Recommend to RN to have follow-up post surgery from [place].

## 2015-05-16 NOTE — Op Note (Signed)
PREOP DIAGNOSIS:  1. Left frontal mass   POSTOP DIAGNOSIS:  1. Left frontal abscess  PROCEDURE: 1. Stereotactic left frontal craniectomy for biopsy of left frontal mass  SURGEON: Dr. Consuella Lose, MD  ASSISTANT: Dr. Ashok Pall, MD  ANESTHESIA: General Endotracheal  EBL: Minimal  SPECIMENS: Abscess fluid for gram stain, culture  DRAINS: None  COMPLICATIONS: None immediate  CONDITION: Hemodynamically stable to PACU  HISTORY: Gregory [name] is a 74 y.o. male initially presented to the hospital with subjective mild right-sided weakness and aphasia. CT scan demonstrated a ring-enhancing left ganglionic mass with surrounding mass effect and edema. Differential diagnosis included primary brain tumor, versus abscess. Of note, the patient does have a history of Osler Weber Rendu, with a right lower lobe arterial venous mouth formation. The patient has a history of non-MRI compatible nasal implant from prior ENT surgery, and is therefore on able to undergo MRI. With these findings, stereotactic biopsy was indicated. The risks and benefits of the procedure were explained in detail to the patient and his wife. After all questions were answered, informed consent was obtained.  PROCEDURE IN DETAIL: After informed consent was obtained and witnessed, the patient was brought to the operating room. After induction of general anesthesia, the patient was positioned on the operative table in the supine position. All pressure points were meticulously padded. The preoperative stereotactic CT scan was then cut registered with surface markers until satisfactory accuracy was achieved. The enhancing portion of the mass was previously marked utilizing the stereotactic system. A separate trajectory was also marked out to access the central portion of the mass. Utilizing the previously created trajectories, the skin incision was marked out, prepped and draped in the usual sterile fashion.  After timeout  was conducted, skin incision was infiltrated with local and set it with epinephrine. Skin incision was then made sharply, and Bovie electrocautery was used to dissect the subcutaneous tissue and the galea aponeurosis. Self-retaining retractor was then placed. The perforator was then used to create a bur hole, and Kerrison rongeurs were used to extend the bur hole to fashion a small craniectomy. The varioguide stereotactic biopsy system with the BrainLab was then attached to the [place]. It was then positioned in the appropriate fashion for the previously marked out trajectories. The dura was then coagulated and incised. Biopsy needle was then introduced, and under gentle aspiration, thick pus was withdrawn. Having identified the mass as an abscess, the Vargo guide was repositioned for the second trajectory to access the central portion of the abscess for drainage. Again, the biopsy needle was introduced, and approximately 7-8 mL of thick pus was ultimately removed. This was sent for Gram stain, aerobic, anaerobic, fungal, and AFB culture.   At this point, the wound is irrigated with copious amounts of him back saline. Gelfoam was placed on the brain surface. A bur hole cover was then placed and secured with standard titanium screws. The skin was then closed with 3 over in the galeal layer, and bacitracin and staples were applied. Dressing was then placed. [place] head holder was then removed, he was transferred stretcher, extubated, and taken to the postanesthesia care unit in stable hemodynamic condition. At the end of the case all sponge, needle, instrument, and cottonoid counts were correct.

## 2015-05-16 NOTE — Anesthesia Procedure Notes (Signed)
Procedure Name: Intubation Date/Time: 05/16/2015 3:53 PM Performed by: Maude Leriche D Pre-anesthesia Checklist: Patient identified, Emergency Drugs available, Suction available, Patient being monitored and Timeout performed Patient Re-evaluated:Patient Re-evaluated prior to inductionOxygen Delivery Method: Circle system utilized Preoxygenation: Pre-oxygenation with 100% oxygen Intubation Type: IV induction Ventilation: Mask ventilation without difficulty Laryngoscope Size: Miller and 2 Grade View: Grade I Tube type: Oral Tube size: 7.5 mm Number of attempts: 1 Airway Equipment and Method: Stylet Placement Confirmation: ETT inserted through vocal cords under direct vision,  positive ETCO2 and breath sounds checked- equal and bilateral Secured at: 22 cm Tube secured with: Tape Dental Injury: Teeth and Oropharynx as per pre-operative assessment

## 2015-05-16 NOTE — Anesthesia Preprocedure Evaluation (Addendum)
Anesthesia Evaluation  Patient identified by MRN, date of birth, ID band Patient awake    Reviewed: Allergy & Precautions, NPO status , Patient's Chart, lab work & pertinent test results, reviewed documented beta blocker date and time   Airway Mallampati: II   Neck ROM: Full    Dental  (+) Teeth Intact   Pulmonary asthma ,  breath sounds clear to auscultation        Cardiovascular Rhythm:Regular  EKG 04/2015 OK   Neuro/Psych Anxiety Left sided Basal ganglia mass  Neuromuscular disease    GI/Hepatic GERD-  Medicated,  Endo/Other  Hypothyroidism (replacement)   Renal/GU      Musculoskeletal   Abdominal (+)  Abdomen: soft.    Peds  Hematology 15/45   Anesthesia Other Findings Osler Weber Redu syndrome  Reproductive/Obstetrics                            Anesthesia Physical Anesthesia Plan  ASA: III  Anesthesia Plan: General   Post-op Pain Management:    Induction: Intravenous  Airway Management Planned: Oral ETT  Additional Equipment:   Intra-op Plan:   Post-operative Plan: Extubation in OR  Informed Consent: I have reviewed the patients History and Physical, chart, labs and discussed the procedure including the risks, benefits and alternatives for the proposed anesthesia with the patient or authorized representative who has indicated his/her understanding and acceptance.     Plan Discussed with:   Anesthesia Plan Comments: (Hx  Osler Weber Redu syndrome, causes vascular fragility.  Has frequent nose bleeds and will need to be extra gentle at intubation      redu+)       Anesthesia Quick Evaluation

## 2015-05-16 NOTE — Progress Notes (Signed)
Paged MD regarding asymptomatic 9 beat run of V-Tach. Orders received.

## 2015-05-16 NOTE — Progress Notes (Signed)
On admission pt refused MRSA swab per protocol.

## 2015-05-16 NOTE — Anesthesia Postprocedure Evaluation (Signed)
  Anesthesia Post-op Note  Patient: Gregory [name]  Procedure(s) Performed: Procedure(s) with comments: Stereotactic Left Brain Biopsy with Brain Lab (Left) - Stereotactic Left Brain biopsy with brainlab APPLICATION OF CRANIAL NAVIGATION (N/A)  Patient Location: PACU  Anesthesia Type:General  Level of Consciousness: awake  Airway and Oxygen Therapy: Patient Spontanous Breathing  Post-op Pain: mild  Post-op Assessment: Post-op Vital signs reviewed  Post-op Vital Signs: Reviewed  Last Vitals:  Filed Vitals:   05/16/15 1800  BP: 116/53  Pulse: 56  Temp: 36.5 C  Resp: 11    Complications: No apparent anesthesia complications

## 2015-05-16 NOTE — Significant Event (Signed)
Called by Dr. Linus Salmons of the [facility], Op notes reviewed. Brain mass aspirate showed thick pus consistent with brain abscess, obtain Blood cultures x2, HIV screening. Started on Vancomycin per pharmacy, Flagyl and high dose Rocephin.   Birdie Hopes Pager: [phone] 05/16/2015, 5:54 PM

## 2015-05-17 LAB — BASIC METABOLIC PANEL
Anion gap: 7 (ref 5–15)
BUN: 14 mg/dL (ref 6–20)
CALCIUM: 7.9 mg/dL — AB (ref 8.9–10.3)
CHLORIDE: 102 mmol/L (ref 101–111)
CO2: 24 mmol/L (ref 22–32)
Creatinine, Ser: 0.87 mg/dL (ref 0.61–1.24)
Glucose, Bld: 108 mg/dL — ABNORMAL HIGH (ref 65–99)
POTASSIUM: 3.9 mmol/L (ref 3.5–5.1)
SODIUM: 133 mmol/L — AB (ref 135–145)

## 2015-05-17 LAB — MAGNESIUM: MAGNESIUM: 1.9 mg/dL (ref 1.7–2.4)

## 2015-05-17 LAB — HIV ANTIBODY (ROUTINE TESTING W REFLEX): HIV Screen 4th Generation wRfx: NONREACTIVE

## 2015-05-17 NOTE — Progress Notes (Signed)
[facility] PROGRESS NOTE   [name] SWF:[id number] DOB: 16-Dec-1941 DOA: 05/13/2015 PCP: Noralee Space, MD  HPI/Subjective: Brain biopsy done on 5/27 showed findings consistent with brain abscess. I will discontinue the dexamethasone, neurosurgery please advise if this needed to be continued.  Assessment/Plan: Principal Problem:   Brain mass Active Problems:   Hypothyroidism   HYPERCHOLESTEROLEMIA, MILD   OSLER-WEBER-RENDU DISEASE   GERD   Nocturnal leg cramps   Hypoxemia   Right sided weakness    Brain abscess Presented with headache and right lower extremity weakness. CT showed 32 cm brain mass with vasogenic cerebral edema and slight midline shift. Neurosurgery consulted and recommended initially steroids and metastatic cancer workup. CT of abdomen pelvis was done and showed no suspicious lesion for primary cancer. PSA is normal, normal colonoscopy in March 2012. Brain biopsy done on 5/27 showed pus which consistent with brain abscess. Gram stain showed GBC and GPR, on vancomycin, Rocephin and Flagyl.  Hyponatremia Presented with sodium of 134 and sodium today is 129. 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 Continue normal saline  Osler-Weber-Rendu syndrome Stable and chronic problem, per report he is prone to epistaxis. 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  Right leg weakness Secondary to brain mass, improved slightly after initiation of steroids.  Hypothyroidism Continue Synthroid, TSH was 4.38 on 04/03/2015   Code Status: Full Code Family Communication: Plan discussed with the patient. Disposition Plan: Remains inpatient Diet: Diet clear liquid Room service appropriate?: Yes; Fluid consistency::  Thin  Consultants:  None  Procedures:  None  Antibiotics:  None   Objective: Filed Vitals:   05/17/15 0600  BP: 98/47  Pulse: 50  Temp:   Resp: 13    Intake/Output Summary (Last 24 hours) at 05/17/15 1139 Last data filed at 05/17/15 0700  Gross per 24 hour  Intake   2706 ml  Output   1400 ml  Net   1306 ml   Filed Weights   05/13/15 1815  Weight: 82.4 kg (181 lb 10.5 oz)    Exam: General: Alert and awake, oriented x3, not in any acute distress. HEENT: anicteric sclera, pupils reactive to light and accommodation, EOMI CVS: S1-S2 clear, no murmur rubs or gallops Chest: clear to auscultation bilaterally, no wheezing, rales or rhonchi Abdomen: soft nontender, nondistended, normal bowel sounds, no organomegaly Extremities: no cyanosis, clubbing or edema noted bilaterally Neuro: Cranial nerves II-XII intact, no focal neurological deficits  Data Reviewed: Basic Metabolic Panel:  Recent Labs Lab 05/13/15 1201 05/14/15 0319 05/15/15 0229 05/16/15 0320 05/16/15 1908 05/16/15 2310 05/17/15 0228  NA 134* 129* 134* 132*  --   --  133*  K 4.5 4.5 3.9 4.2  --   --  3.9  CL 99* 97* 100* 103  --   --  102  CO2 26 21* 23 23  --   --  24  GLUCOSE 94 126* 108* 114*  --   --  108*  BUN 11 11 17  21*  --   --  14  CREATININE 1.07 0.95 1.00 0.93 0.95  --  0.87  CALCIUM 9.2 9.0 8.8* 8.3*  --   --  7.9*  MG  --   --   --   --   --  1.9  --    Liver Function Tests:  Recent Labs Lab 05/13/15 1201  AST 24  ALT 16*  ALKPHOS 60  BILITOT 0.8  PROT 6.6  ALBUMIN 3.9   No results for input(s): LIPASE, AMYLASE in the last 168 hours. No results for input(s): AMMONIA in the last 168 hours. CBC:  Recent Labs Lab 05/13/15 1201 05/14/15 0319 05/16/15 1908  WBC 10.2 8.9 14.4*  NEUTROABS 6.0  --   --   HGB 15.5 15.6 14.9  HCT 44.6 44.8 41.4  MCV 87.8 87.5 87.7  PLT 244 248 231   Cardiac Enzymes: No results for input(s): CKTOTAL, CKMB, CKMBINDEX, TROPONINI in the  last 168 hours. BNP (last 3 results) No results for input(s): BNP in the last 8760 hours.  ProBNP (last 3 results) No results for input(s): PROBNP in the last 8760 hours.  CBG: No results for input(s): GLUCAP in the last 168 hours.  Micro Recent Results (from the past 240 hour(s))  Gram stain     Status: None   Collection Time: 05/16/15  4:30 PM  Result Value Ref Range Status   Specimen Description ABSCESS  Final   Special Requests LEFT FRONTAL BRAIN MASS  Final   Gram Stain   Final    MODERATE WBC PRESENT,BOTH PMN AND MONONUCLEAR ABUNDANT GRAM POSITIVE COCCI IN PAIRS IN CHAINS FEW GRAM POSITIVE RODS    Report Status 05/16/2015 FINAL  Final  MRSA PCR Screening     Status: None   Collection Time: 05/16/15  6:40 PM  Result Value Ref Range Status   MRSA by PCR NEGATIVE NEGATIVE Final    Comment:        The GeneXpert MRSA Assay (FDA approved for NASAL specimens only), is one component of a comprehensive MRSA colonization surveillance program. It is not intended to diagnose MRSA infection nor to guide or monitor treatment for MRSA infections.      Studies: No results found.  Scheduled Meds: . cefTRIAXone (ROCEPHIN)  IV  2 g Intravenous Q12H  . dexamethasone  4 mg Intravenous 4 times per day  . heparin subcutaneous  5,000 Units Subcutaneous 3 times per day  . levETIRAcetam  500 mg Oral BID  . levothyroxine  125 mcg Oral QAC breakfast  . magnesium oxide  200 mg Oral QHS  . metroNIDAZOLE  500 mg Oral 3 times per day  . pantoprazole  40 mg Oral Daily  . sodium chloride  3 mL Intravenous Q12H  . tamsulosin  0.4 mg Oral QPC breakfast  . vancomycin  1,250 mg Intravenous Q12H  . vitamin C  2,000 mg Oral Daily   Continuous Infusions: . sodium chloride 75 mL/hr at 05/16/15 2309       Time spent: 35 minutes    [facility] Pager [phone] If 7PM-7AM, please contact night-coverage at [email or web address], password [facility] 05/17/2015, 11:39 AM  LOS: 4  days

## 2015-05-17 NOTE — Consult Note (Signed)
[facility] for [facility]ious Disease     Reason for Consult: brain abscess    Referring Physician: Dr. Seymour Bars  Principal Problem:   Brain mass Active Problems:   Hypothyroidism   HYPERCHOLESTEROLEMIA, MILD   OSLER-WEBER-RENDU DISEASE   GERD   Nocturnal leg cramps   Hypoxemia   Right sided weakness   . cefTRIAXone (ROCEPHIN)  IV  2 g Intravenous Q12H  . dexamethasone  4 mg Intravenous 4 times per day  . heparin subcutaneous  5,000 Units Subcutaneous 3 times per day  . levETIRAcetam  500 mg Oral BID  . levothyroxine  125 mcg Oral QAC breakfast  . magnesium oxide  200 mg Oral QHS  . metroNIDAZOLE  500 mg Oral 3 times per day  . pantoprazole  40 mg Oral Daily  . sodium chloride  3 mL Intravenous Q12H  . tamsulosin  0.4 mg Oral QPC breakfast  . vancomycin  1,250 mg Intravenous Q12H  . 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     Antibiotics: As above, started after aspiration  HPI: Gregory [name] is a 74 y.o. male with Rendu, Osler, Weber who has had some dizziness over the last 1-2 months and then right sided weakness of leg and walking, also difficulty writing.  He is right handed.  Had CT scan and concern for primary ganglionic tumor but sterotactic biopsy with abscess.  Gram stain GPC.  Had no fever or chills.    Review of Systems: A comprehensive review of systems was negative.  Past Medical History  Diagnosis Date  . Other diseases of nasal cavity and sinuses(478.19)   . Pure hypercholesterolemia   . Unspecified hypothyroidism   . Irritable bowel syndrome   . Benign neoplasm of colon   . Hypertrophy of prostate with urinary obstruction and other lower urinary tract symptoms (LUTS)   . Lumbago   . Plantar fascial fibromatosis   . Anxiety state, unspecified   . Persistent disorder of  initiating or maintaining sleep   . GERD (gastroesophageal reflux disease)   . Arthritis   . 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 History of nonmelanoma skin cancer EXCISION SQUAMOUS CELL FROM HAND  . 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 Smoking status: Never Smoker   . Smokeless tobacco: Never Used  . Alcohol Use: No    Family History  Problem Relation Age of Onset  . Diabetes Mother   . Hypertension Mother   . Pancreatic cancer Mother   . Stroke Mother   . Kidney failure Father   . Hypertension Sister    Allergies  Allergen Reactions  . Aspirin Other (See Comments)    nosebleeds  . Statins Other (See Comments)    intolerance  . Zocor [Simvastatin - High Dose] Other (See Comments)    Leg cramps    OBJECTIVE: Blood pressure 98/47, pulse 50, temperature 97.8 F (36.6 C), temperature source Axillary, resp. rate 13, height 5\' 10"  (1.778 m), weight 181 lb 10.5 oz (82.4 kg), SpO2 93 %. General: awake, alert, nad Skin: no rashes Lungs: CTA B Cor: RRR Abdomen: soft, nt, nd Ext: no edema  HEENT: head with biopsy area with no surrounding erythema  Microbiology: Recent Results (from the past 240 hour(s))  Gram stain     Status: None   Collection Time: 05/16/15  4:30 PM  Result Value Ref Range Status   Specimen Description ABSCESS  Final   Special Requests LEFT FRONTAL BRAIN MASS  Final   Gram Stain   Final    MODERATE WBC PRESENT,BOTH PMN AND MONONUCLEAR ABUNDANT GRAM POSITIVE COCCI IN PAIRS IN CHAINS FEW GRAM POSITIVE RODS    Report Status 05/16/2015 FINAL  Final  MRSA PCR Screening     Status: None   Collection Time: 05/16/15  6:40 PM  Result Value Ref Range Status   MRSA by PCR NEGATIVE NEGATIVE Final    Comment:        The GeneXpert MRSA Assay (FDA approved for  NASAL specimens only), is one component of a comprehensive MRSA colonization surveillance program. It is not intended to diagnose MRSA infection nor to guide or monitor treatment for MRSA infections.     Scharlene Gloss, [facility] [place] [email or web address] [phone] pager  [phone] cell 05/17/2015, 11:01 AM

## 2015-05-17 NOTE — Progress Notes (Signed)
Patient ID: Gregory [name], male   DOB: 1941-11-05, 74 y.o.   MRN: [id number] BP 98/47 mmHg  Pulse 50  Temp(Src) 97.8 F (36.6 C) (Axillary)  Resp 13  Ht 5\' 10"  (1.778 m)  Wt 82.4 kg (181 lb 10.5 oz)  BMI 26.07 kg/m2  SpO2 93% Alert and oriented x 4 No drift, moving all extremities Wound is clean and dry Doing well

## 2015-05-18 DIAGNOSIS — G060 Intracranial abscess and granuloma: Secondary | ICD-10-CM | POA: Diagnosis present

## 2015-05-18 DIAGNOSIS — D496 Neoplasm of unspecified behavior of brain: Secondary | ICD-10-CM

## 2015-05-18 DIAGNOSIS — R0902 Hypoxemia: Secondary | ICD-10-CM

## 2015-05-18 DIAGNOSIS — C801 Malignant (primary) neoplasm, unspecified: Secondary | ICD-10-CM

## 2015-05-18 LAB — BASIC METABOLIC PANEL
ANION GAP: 8 (ref 5–15)
BUN: 10 mg/dL (ref 6–20)
CO2: 29 mmol/L (ref 22–32)
CREATININE: 0.96 mg/dL (ref 0.61–1.24)
Calcium: 8 mg/dL — ABNORMAL LOW (ref 8.9–10.3)
Chloride: 97 mmol/L — ABNORMAL LOW (ref 101–111)
GFR calc non Af Amer: >60 mL/min (ref >60)
Glucose, Bld: 88 mg/dL (ref 65–99)
Potassium: 3.2 mmol/L — ABNORMAL LOW (ref 3.5–5.1)
Sodium: 134 mmol/L — ABNORMAL LOW (ref 135–145)

## 2015-05-18 LAB — CBC
HEMATOCRIT: 40.7 % (ref 39.0–52.0)
Hemoglobin: 14.3 g/dL (ref 13.0–17.0)
MCH: 30.9 pg (ref 26.0–34.0)
MCHC: 35.1 g/dL (ref 30.0–36.0)
MCV: 87.9 fL (ref 78.0–100.0)
Platelets: 231 10*3/uL (ref 150–400)
RBC: 4.63 MIL/uL (ref 4.22–5.81)
RDW: 13.1 % (ref 11.5–15.5)
WBC: 11.7 10*3/uL — ABNORMAL HIGH (ref 4.0–10.5)

## 2015-05-18 LAB — VANCOMYCIN, TROUGH: VANCOMYCIN TR: 15 ug/mL (ref 10.0–20.0)

## 2015-05-18 MED ORDER — POTASSIUM CHLORIDE CRYS ER 20 MEQ PO TBCR
40.0000 meq | EXTENDED_RELEASE_TABLET | Freq: Four times a day (QID) | ORAL | Status: AC
  Administered 2015-05-18 (×2): 40 meq via ORAL
  Filled 2015-05-18 (×2): qty 2

## 2015-05-18 NOTE — Progress Notes (Signed)
[facility] for Infectious Disease  Date of Admission:  05/13/2015  Antibiotics: Vanco/ceftriax/flagyl  Subjective: No complaints  Objective: Temp:  [97.4 F (36.3 C)-98.2 F (36.8 C)] 97.8 F (36.6 C) (05/29 0755) Pulse Rate:  [49-65] 58 (05/29 1100) Resp:  [11-17] 15 (05/29 1100) BP: (101-138)/(47-65) 138/57 mmHg (05/29 1100) SpO2:  [89 %-98 %] 94 % (05/29 1100)  General: awake, in bed, nad Skin: no rashes Lungs: CTA Cor: RRR   Lab Results Lab Results  Component Value Date   WBC 11.7* 05/18/2015   HGB 14.3 05/18/2015   HCT 40.7 05/18/2015   MCV 87.9 05/18/2015   PLT 231 05/18/2015    Lab Results  Component Value Date   CREATININE 0.96 05/18/2015   BUN 10 05/18/2015   NA 134* 05/18/2015   K 3.2* 05/18/2015   CL 97* 05/18/2015   CO2 29 05/18/2015    Lab Results  Component Value Date   ALT 16* 05/13/2015   AST 24 05/13/2015   ALKPHOS 60 05/13/2015   BILITOT 0.8 05/13/2015      Microbiology: Recent Results (from the past 240 hour(s))  Anaerobic culture     Status: None (Preliminary result)   Collection Time: 05/16/15  4:30 PM  Result Value Ref Range Status   Specimen Description ABSCESS  Final   Special Requests LEFT FRONTAL BRAIN MASS  Final   Gram Stain   Final    MODERATE WBC PRESENT,BOTH PMN AND MONONUCLEAR NO SQUAMOUS EPITHELIAL CELLS SEEN ABUNDANT GRAM POSITIVE COCCI IN PAIRS IN CHAINS FEW GRAM POSITIVE RODS Performed at [facility]    Culture   Final    NO ANAEROBES ISOLATED; CULTURE IN PROGRESS FOR 5 DAYS Performed at [facility]    Report Status PENDING  Incomplete  Fungus Culture with Smear     Status: None (Preliminary result)   Collection Time: 05/16/15  4:30 PM  Result Value Ref Range Status   Specimen Description ABSCESS  Final   Special Requests LEFT FRONTAL BRAIN MASS  Final   Fungal Smear   Final    NO YEAST OR FUNGAL ELEMENTS SEEN Performed at [facility]    Culture   Final    CULTURE IN  PROGRESS FOR FOUR WEEKS Performed at [facility]    Report Status PENDING  Incomplete  Gram stain     Status: None   Collection Time: 05/16/15  4:30 PM  Result Value Ref Range Status   Specimen Description ABSCESS  Final   Special Requests LEFT FRONTAL BRAIN MASS  Final   Gram Stain   Final    MODERATE WBC PRESENT,BOTH PMN AND MONONUCLEAR ABUNDANT GRAM POSITIVE COCCI IN PAIRS IN CHAINS FEW GRAM POSITIVE RODS    Report Status 05/16/2015 FINAL  Final  AFB culture with smear     Status: None (Preliminary result)   Collection Time: 05/16/15  4:30 PM  Result Value Ref Range Status   Specimen Description ABSCESS  Final   Special Requests LEFT FRONTAL BRAIN MASS  Final   Acid Fast Smear   Final    NO ACID FAST BACILLI SEEN Performed at [facility]    Culture   Final    CULTURE WILL BE EXAMINED FOR 6 WEEKS BEFORE ISSUING A FINAL REPORT Performed at [facility]    Report Status PENDING  Incomplete  Culture, routine-abscess     Status: None (Preliminary result)   Collection Time: 05/16/15  4:30 PM  Result Value  Ref Range Status   Specimen Description ABSCESS  Final   Special Requests LEFT FRONTAL BRAIN MASS  Final   Gram Stain   Final    MODERATE WBC PRESENT,BOTH PMN AND MONONUCLEAR NO SQUAMOUS EPITHELIAL CELLS SEEN ABUNDANT GRAM POSITIVE COCCI IN PAIRS IN CHAINS FEW GRAM POSITIVE RODS Performed at [facility]    Culture   Final    NO GROWTH 1 DAY Performed at [facility]    Report Status PENDING  Incomplete  MRSA PCR Screening     Status: None   Collection Time: 05/16/15  6:40 PM  Result Value Ref Range Status   MRSA by PCR NEGATIVE NEGATIVE Final    Comment:        The GeneXpert MRSA Assay (FDA approved for NASAL specimens only), is one component of a comprehensive MRSA colonization surveillance program. It is not intended to diagnose MRSA infection nor to guide or monitor treatment for MRSA infections.   Culture, blood  (routine x 2)     Status: None (Preliminary result)   Collection Time: 05/16/15  7:00 PM  Result Value Ref Range Status   Specimen Description BLOOD LEFT ANTECUBITAL  Final   Special Requests BOTTLES DRAWN AEROBIC AND ANAEROBIC 5CC  EA  Final   Culture   Final           BLOOD CULTURE RECEIVED NO GROWTH TO DATE CULTURE WILL BE HELD FOR 5 DAYS BEFORE ISSUING A FINAL NEGATIVE REPORT Performed at [facility]    Report Status PENDING  Incomplete  Culture, blood (routine x 2)     Status: None (Preliminary result)   Collection Time: 05/16/15  7:08 PM  Result Value Ref Range Status   Specimen Description BLOOD LEFT HAND  Final   Special Requests BOTTLES DRAWN AEROBIC ONLY 2CC  Final   Culture   Final           BLOOD CULTURE RECEIVED NO GROWTH TO DATE CULTURE WILL BE HELD FOR 5 DAYS BEFORE ISSUING A FINAL NEGATIVE REPORT Note: Culture results may be compromised due to an inadequate volume of blood received in culture bottles. Performed at [facility]    Report Status PENDING  Incomplete    Studies/Results: No results found.  Assessment/Plan:  1) brain abscess - culture ngtd.  Continue with broad coverage.   Dr. Johnnye Sima back tomorrow.    Scharlene Gloss, [facility] for Infectious Disease [place] [email or web address] [phone] pager   [phone] cell 05/18/2015, 11:49 AM

## 2015-05-18 NOTE — Progress Notes (Signed)
ANTIBIOTIC CONSULT NOTE - FOLLOW UP  Pharmacy Consult for vancomycin Indication: brain abscess  Allergies  Allergen Reactions  . Aspirin Other (See Comments)    nosebleeds  . Statins Other (See Comments)    intolerance  . Zocor [Simvastatin - High Dose] Other (See Comments)    Leg cramps   Patient Measurements: Height: 5\' 10"  (177.8 cm) Weight: 181 lb 10.5 oz (82.4 kg) IBW/kg (Calculated) : 73 Vital Signs: Temp: 98.1 F (36.7 C) (05/29 1200) Temp Source: Axillary (05/29 1200) BP: 130/110 mmHg (05/29 1300) Pulse Rate: 70 (05/29 1300) Intake/Output from previous day: 05/28 0701 - 05/29 0700 In: 3456 [P.O.:1050; I.V.:1806; IV Piggyback:600] Out: 2900 [Urine:2900] Intake/Output from this shift: Total I/O In: 765 [P.O.:240; I.V.:225; IV Piggyback:300] Out: 1100 [Urine:1100] Labs:  Recent Labs  05/16/15 1908 05/17/15 0228 05/18/15 0225  WBC 14.4*  --  11.7*  HGB 14.9  --  14.3  PLT 231  --  231  CREATININE 0.95 0.87 0.96   Estimated Creatinine Clearance: 71.8 mL/min (by C-G formula based on Cr of 0.96).  Recent Labs  05/18/15 0735  VANCOTROUGH 15    Assessment: 74 year old male with brain abscess on vancomycin, ceftriaxone, and metronidazole day # 3.   Vancomycin trough today is 15 - therapeutic for goal on 1250mg  IV q12h.   WBC is trending down at 11.7. SCr is 0.96 (stable), CrCl ~70-75 mL/min. Afebrile. UOP great.  Goal of Therapy:  Vancomycin trough level 15-20 mcg/ml  Plan:  Continue Vancomycin 1250mg  IV every 12 hours.  Monitor culture results, clinical status, and renal function.   Sloan Leiter, PharmD, BCPS Clinical Pharmacist [phone] 05/18/2015,2:25 PM

## 2015-05-18 NOTE — Progress Notes (Signed)
[facility] PROGRESS NOTE   Gregory [name] PPJ:[id number] DOB: 04/22/1941 DOA: 05/13/2015 PCP: Noralee Space, MD  HPI/Subjective: Feels better, normal strength in 4 extremities. On clear liquids, advanced to regular. Transfer to regular bed.  Assessment/Plan: Principal Problem:   Brain mass Active Problems:   Hypothyroidism   HYPERCHOLESTEROLEMIA, MILD   OSLER-WEBER-RENDU DISEASE   GERD   Nocturnal leg cramps   Hypoxemia   Right sided weakness    Brain abscess Presented with headache and right lower extremity weakness. CT showed 32 cm brain mass with vasogenic cerebral edema and slight midline shift. Neurosurgery consulted and recommended initially steroids and metastatic cancer workup. CT of abdomen pelvis was done and showed no suspicious lesion for primary cancer. PSA is normal, normal colonoscopy in March 2012. Brain biopsy done on 5/27 showed pus which consistent with brain abscess. Gram stain showed GBC and GPR, on vancomycin, Rocephin and Flagyl. Continue current antibiotics.  Hyponatremia Presented with sodium of 134 and sodium today is 129. 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 Decrease normal saline to [facility].  Osler-Weber-Rendu syndrome Stable and chronic problem, per report he is prone to epistaxis. 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  Hypokalemia Replete with oral supplements.  Right leg weakness Secondary to brain mass, improved slightly after initiation of steroids.  Hypothyroidism Continue Synthroid, TSH was 4.38 on 04/03/2015   Code Status: Full Code Family Communication: Plan discussed with the patient. Disposition Plan: Remains inpatient Diet: Diet Heart Room service appropriate?: Yes; Fluid consistency::  Thin  Consultants:  None  Procedures:  None  Antibiotics:  None   Objective: Filed Vitals:   05/18/15 1100  BP: 138/57  Pulse: 58  Temp:   Resp: 15    Intake/Output Summary (Last 24 hours) at 05/18/15 1201 Last data filed at 05/18/15 1101  Gross per 24 hour  Intake   3066 ml  Output   2900 ml  Net    166 ml   Filed Weights   05/13/15 1815  Weight: 82.4 kg (181 lb 10.5 oz)    Exam: General: Alert and awake, oriented x3, not in any acute distress. HEENT: anicteric sclera, pupils reactive to light and accommodation, EOMI CVS: S1-S2 clear, no murmur rubs or gallops Chest: clear to auscultation bilaterally, no wheezing, rales or rhonchi Abdomen: soft nontender, nondistended, normal bowel sounds, no organomegaly Extremities: no cyanosis, clubbing or edema noted bilaterally Neuro: Cranial nerves II-XII intact, no focal neurological deficits  Data Reviewed: Basic Metabolic Panel:  Recent Labs Lab 05/14/15 0319 05/15/15 0229 05/16/15 0320 05/16/15 1908 05/16/15 2310 05/17/15 0228 05/18/15 0225  NA 129* 134* 132*  --   --  133* 134*  K 4.5 3.9 4.2  --   --  3.9 3.2*  CL 97* 100* 103  --   --  102 97*  CO2 21* 23 23  --   --  24 29  GLUCOSE 126* 108* 114*  --   --  108* 88  BUN 11 17 21*  --   --  14 10  CREATININE 0.95 1.00 0.93 0.95  --  0.87 0.96  CALCIUM 9.0 8.8* 8.3*  --   --  7.9* 8.0*  MG  --   --   --   --  1.9  --   --    Liver Function Tests:  Recent Labs  Lab 05/13/15 1201  AST 24  ALT 16*  ALKPHOS 60  BILITOT 0.8  PROT 6.6  ALBUMIN 3.9   No results for input(s): LIPASE, AMYLASE in the last 168 hours. No results for input(s): AMMONIA in the last 168 hours. CBC:  Recent Labs Lab 05/13/15 1201 05/14/15 0319 05/16/15 1908 05/18/15 0225  WBC 10.2 8.9 14.4* 11.7*  NEUTROABS 6.0  --   --   --   HGB 15.5 15.6 14.9 14.3  HCT 44.6 44.8 41.4 40.7  MCV 87.8 87.5 87.7 87.9  PLT 244 248 231 231   Cardiac Enzymes: No results for  input(s): CKTOTAL, CKMB, CKMBINDEX, TROPONINI in the last 168 hours. BNP (last 3 results) No results for input(s): BNP in the last 8760 hours.  ProBNP (last 3 results) No results for input(s): PROBNP in the last 8760 hours.  CBG: No results for input(s): GLUCAP in the last 168 hours.  Micro Recent Results (from the past 240 hour(s))  Anaerobic culture     Status: None (Preliminary result)   Collection Time: 05/16/15  4:30 PM  Result Value Ref Range Status   Specimen Description ABSCESS  Final   Special Requests LEFT FRONTAL BRAIN MASS  Final   Gram Stain   Final    MODERATE WBC PRESENT,BOTH PMN AND MONONUCLEAR NO SQUAMOUS EPITHELIAL CELLS SEEN ABUNDANT GRAM POSITIVE COCCI IN PAIRS IN CHAINS FEW GRAM POSITIVE RODS Performed at [facility]    Culture   Final    NO ANAEROBES ISOLATED; CULTURE IN PROGRESS FOR 5 DAYS Performed at [facility]    Report Status PENDING  Incomplete  Fungus Culture with Smear     Status: None (Preliminary result)   Collection Time: 05/16/15  4:30 PM  Result Value Ref Range Status   Specimen Description ABSCESS  Final   Special Requests LEFT FRONTAL BRAIN MASS  Final   Fungal Smear   Final    NO YEAST OR FUNGAL ELEMENTS SEEN Performed at [facility]    Culture   Final    CULTURE IN PROGRESS FOR FOUR WEEKS Performed at [facility]    Report Status PENDING  Incomplete  Gram stain     Status: None   Collection Time: 05/16/15  4:30 PM  Result Value Ref Range Status   Specimen Description ABSCESS  Final   Special Requests LEFT FRONTAL BRAIN MASS  Final   Gram Stain   Final    MODERATE WBC PRESENT,BOTH PMN AND MONONUCLEAR ABUNDANT GRAM POSITIVE COCCI IN PAIRS IN CHAINS FEW GRAM POSITIVE RODS    Report Status 05/16/2015 FINAL  Final  AFB culture with smear     Status: None (Preliminary result)   Collection Time: 05/16/15  4:30 PM  Result Value Ref Range Status   Specimen Description ABSCESS  Final   Special  Requests LEFT FRONTAL BRAIN MASS  Final   Acid Fast Smear   Final    NO ACID FAST BACILLI SEEN Performed at [facility]    Culture   Final    CULTURE WILL BE EXAMINED FOR 6 WEEKS BEFORE ISSUING A FINAL REPORT Performed at [facility]    Report Status PENDING  Incomplete  Culture, routine-abscess     Status: None (Preliminary result)   Collection Time: 05/16/15  4:30 PM  Result Value Ref Range Status   Specimen Description ABSCESS  Final   Special Requests LEFT FRONTAL BRAIN MASS  Final   Gram Stain   Final  MODERATE WBC PRESENT,BOTH PMN AND MONONUCLEAR NO SQUAMOUS EPITHELIAL CELLS SEEN ABUNDANT GRAM POSITIVE COCCI IN PAIRS IN CHAINS FEW GRAM POSITIVE RODS Performed at [facility]    Culture   Final    NO GROWTH 1 DAY Performed at [facility]    Report Status PENDING  Incomplete  MRSA PCR Screening     Status: None   Collection Time: 05/16/15  6:40 PM  Result Value Ref Range Status   MRSA by PCR NEGATIVE NEGATIVE Final    Comment:        The GeneXpert MRSA Assay (FDA approved for NASAL specimens only), is one component of a comprehensive MRSA colonization surveillance program. It is not intended to diagnose MRSA infection nor to guide or monitor treatment for MRSA infections.   Culture, blood (routine x 2)     Status: None (Preliminary result)   Collection Time: 05/16/15  7:00 PM  Result Value Ref Range Status   Specimen Description BLOOD LEFT ANTECUBITAL  Final   Special Requests BOTTLES DRAWN AEROBIC AND ANAEROBIC 5CC  EA  Final   Culture   Final           BLOOD CULTURE RECEIVED NO GROWTH TO DATE CULTURE WILL BE HELD FOR 5 DAYS BEFORE ISSUING A FINAL NEGATIVE REPORT Performed at [facility]    Report Status PENDING  Incomplete  Culture, blood (routine x 2)     Status: None (Preliminary result)   Collection Time: 05/16/15  7:08 PM  Result Value Ref Range Status   Specimen Description BLOOD LEFT HAND  Final   Special  Requests BOTTLES DRAWN AEROBIC ONLY 2CC  Final   Culture   Final           BLOOD CULTURE RECEIVED NO GROWTH TO DATE CULTURE WILL BE HELD FOR 5 DAYS BEFORE ISSUING A FINAL NEGATIVE REPORT Note: Culture results may be compromised due to an inadequate volume of blood received in culture bottles. Performed at [facility]    Report Status PENDING  Incomplete     Studies: No results found.  Scheduled Meds: . cefTRIAXone (ROCEPHIN)  IV  2 g Intravenous Q12H  . heparin subcutaneous  5,000 Units Subcutaneous 3 times per day  . levETIRAcetam  500 mg Oral BID  . levothyroxine  125 mcg Oral QAC breakfast  . magnesium oxide  200 mg Oral QHS  . metroNIDAZOLE  500 mg Oral 3 times per day  . pantoprazole  40 mg Oral Daily  . potassium chloride  40 mEq Oral Q6H  . sodium chloride  3 mL Intravenous Q12H  . tamsulosin  0.4 mg Oral QPC breakfast  . vancomycin  1,250 mg Intravenous Q12H  . vitamin C  2,000 mg Oral Daily   Continuous Infusions: . sodium chloride Stopped (05/18/15 1000)       Time spent: 35 minutes    [employer] A  [facility] Pager [phone] If 7PM-7AM, please contact night-coverage at [email or web address], password [facility] 05/18/2015, 12:01 PM  LOS: 5 days

## 2015-05-18 NOTE — Progress Notes (Signed)
Patient admitted to 4N22 from 46M patient alert and oriented  Rates pain at a 2 on a scale of 1-10. Honeycomb dressing covered with tegaderm to  Left forehead. Welcomed to unit and oriented to room Tele monitoring initiated.

## 2015-05-19 DIAGNOSIS — B9689 Other specified bacterial agents as the cause of diseases classified elsewhere: Secondary | ICD-10-CM

## 2015-05-19 DIAGNOSIS — G060 Intracranial abscess and granuloma: Principal | ICD-10-CM

## 2015-05-19 DIAGNOSIS — I780 Hereditary hemorrhagic telangiectasia: Secondary | ICD-10-CM

## 2015-05-19 DIAGNOSIS — Z9889 Other specified postprocedural states: Secondary | ICD-10-CM

## 2015-05-19 LAB — BASIC METABOLIC PANEL
ANION GAP: 10 (ref 5–15)
BUN: 15 mg/dL (ref 6–20)
CALCIUM: 8.1 mg/dL — AB (ref 8.9–10.3)
CO2: 23 mmol/L (ref 22–32)
Chloride: 99 mmol/L — ABNORMAL LOW (ref 101–111)
Creatinine, Ser: 0.98 mg/dL (ref 0.61–1.24)
GFR calc Af Amer: >60 mL/min (ref >60)
GFR calc non Af Amer: >60 mL/min (ref >60)
Glucose, Bld: 162 mg/dL — ABNORMAL HIGH (ref 65–99)
Potassium: 3.8 mmol/L (ref 3.5–5.1)
SODIUM: 132 mmol/L — AB (ref 135–145)

## 2015-05-19 LAB — CBC
HCT: 42.2 % (ref 39.0–52.0)
Hemoglobin: 15.1 g/dL (ref 13.0–17.0)
MCH: 30.9 pg (ref 26.0–34.0)
MCHC: 35.8 g/dL (ref 30.0–36.0)
MCV: 86.5 fL (ref 78.0–100.0)
Platelets: 248 10*3/uL (ref 150–400)
RBC: 4.88 MIL/uL (ref 4.22–5.81)
RDW: 12.9 % (ref 11.5–15.5)
WBC: 11.9 10*3/uL — ABNORMAL HIGH (ref 4.0–10.5)

## 2015-05-19 MED ORDER — SODIUM CHLORIDE 0.9 % IJ SOLN: 10.0000 mL | INTRAMUSCULAR | Status: DC | PRN

## 2015-05-19 NOTE — Progress Notes (Signed)
Patient ID: Gregory [name], male   DOB: Mar 07, 1941, 74 y.o.   MRN: [id number] Stable, no weakness. Wound dry. No headache

## 2015-05-19 NOTE — Progress Notes (Signed)
Peripherally Inserted Central Catheter/Midline Placement  The IV Nurse has discussed with the patient and/or persons authorized to consent for the patient, the purpose of this procedure and the potential benefits and risks involved with this procedure.  The benefits include less needle sticks, lab draws from the catheter and patient may be discharged home with the catheter.  Risks include, but not limited to, infection, bleeding, blood clot (thrombus formation), and puncture of an artery; nerve damage and irregular heat beat.  Alternatives to this procedure were also discussed.  Consent obtained by Margretta Sidle, RN and signed by wife at patient request.  PICC/Midline Placement Documentation        Taitum Alms, Nicolette Bang 05/19/2015, 3:01 PM

## 2015-05-19 NOTE — Progress Notes (Signed)
INFECTIOUS DISEASE PROGRESS NOTE  ID: Gregory [name] is a 74 y.o. male with  Principal Problem:   Brain mass Active Problems:   Hypothyroidism   HYPERCHOLESTEROLEMIA, MILD   OSLER-WEBER-RENDU DISEASE   GERD   Nocturnal leg cramps   Hypoxemia   Right sided weakness   Brain tumor   Cancer  Subjective: Up eating lunch.  Without complaints.  Had dental cleaning ~ 1 month ago, no dental or CV problems.   Abtx:  Anti-infectives    Start     Dose/Rate Route Frequency Ordered Stop   05/16/15 2200  metroNIDAZOLE (FLAGYL) tablet 500 mg     500 mg Oral 3 times per day 05/16/15 1741     05/16/15 2000  cefTRIAXone (ROCEPHIN) 2 g in dextrose 5 % 50 mL IVPB - Premix     2 g 100 mL/hr over 30 Minutes Intravenous Every 12 hours 05/16/15 1741     05/16/15 2000  vancomycin (VANCOCIN) 1,250 mg in sodium chloride 0.9 % 250 mL IVPB     1,250 mg 166.7 mL/hr over 90 Minutes Intravenous Every 12 hours 05/16/15 1839     05/16/15 1628  bacitracin 50,000 Units in sodium chloride irrigation 0.9 % 500 mL irrigation  Status:  Discontinued       As needed 05/16/15 1628 05/16/15 1712      Medications:  Scheduled: . cefTRIAXone (ROCEPHIN)  IV  2 g Intravenous Q12H  . heparin subcutaneous  5,000 Units Subcutaneous 3 times per day  . levETIRAcetam  500 mg Oral BID  . levothyroxine  125 mcg Oral QAC breakfast  . magnesium oxide  200 mg Oral QHS  . metroNIDAZOLE  500 mg Oral 3 times per day  . pantoprazole  40 mg Oral Daily  . sodium chloride  3 mL Intravenous Q12H  . tamsulosin  0.4 mg Oral QPC breakfast  . vancomycin  1,250 mg Intravenous Q12H  . vitamin C  2,000 mg Oral Daily    Objective: Vital signs in last 24 hours: Temp:  [98 F (36.7 C)-99.5 F (37.5 C)] 98 F (36.7 C) (05/30 0916) Pulse Rate:  [70-95] 75 (05/30 0916) Resp:  [15-18] 18 (05/30 0916) BP: (110-133)/(46-110) 114/51 mmHg (05/30 0916) SpO2:  [90 %-96 %] 93 % (05/30 0916) Weight:  [83.371 kg (183 lb 12.8 oz)] 83.371 kg  (183 lb 12.8 oz) (05/29 2147)   General appearance: alert, cooperative and no distress Head: wund dressed, clean.  Throat: lips, mucosa, and tongue normal; teeth and gums normal Neck: no adenopathy and supple, symmetrical, trachea midline Resp: clear to auscultation bilaterally Cardio: regular rate and rhythm GI: normal findings: bowel sounds normal and soft, non-tender  Lab Results  Recent Labs  05/17/15 0228 05/18/15 0225 05/19/15 0933  WBC  --  11.7* 11.9*  HGB  --  14.3 15.1  HCT  --  40.7 42.2  NA 133* 134*  --   K 3.9 3.2*  --   CL 102 97*  --   CO2 24 29  --   BUN 14 10  --   CREATININE 0.87 0.96  --    Liver Panel No results for input(s): PROT, ALBUMIN, AST, ALT, ALKPHOS, BILITOT, BILIDIR, IBILI in the last 72 hours. Sedimentation Rate No results for input(s): ESRSEDRATE in the last 72 hours. C-Reactive Protein No results for input(s): CRP in the last 72 hours.  Microbiology: Recent Results (from the past 240 hour(s))  Anaerobic culture     Status: None (Preliminary  result)   Collection Time: 05/16/15  4:30 PM  Result Value Ref Range Status   Specimen Description ABSCESS  Final   Special Requests LEFT FRONTAL BRAIN MASS  Final   Gram Stain   Final    MODERATE WBC PRESENT,BOTH PMN AND MONONUCLEAR NO SQUAMOUS EPITHELIAL CELLS SEEN ABUNDANT GRAM POSITIVE COCCI IN PAIRS IN CHAINS FEW GRAM POSITIVE RODS Performed at [facility]    Culture   Final    NO ANAEROBES ISOLATED; CULTURE IN PROGRESS FOR 5 DAYS Performed at [facility]    Report Status PENDING  Incomplete  Fungus Culture with Smear     Status: None (Preliminary result)   Collection Time: 05/16/15  4:30 PM  Result Value Ref Range Status   Specimen Description ABSCESS  Final   Special Requests LEFT FRONTAL BRAIN MASS  Final   Fungal Smear   Final    NO YEAST OR FUNGAL ELEMENTS SEEN Performed at [facility]    Culture   Final    CULTURE IN PROGRESS FOR FOUR  WEEKS Performed at [facility]    Report Status PENDING  Incomplete  Gram stain     Status: None   Collection Time: 05/16/15  4:30 PM  Result Value Ref Range Status   Specimen Description ABSCESS  Final   Special Requests LEFT FRONTAL BRAIN MASS  Final   Gram Stain   Final    MODERATE WBC PRESENT,BOTH PMN AND MONONUCLEAR ABUNDANT GRAM POSITIVE COCCI IN PAIRS IN CHAINS FEW GRAM POSITIVE RODS    Report Status 05/16/2015 FINAL  Final  AFB culture with smear     Status: None (Preliminary result)   Collection Time: 05/16/15  4:30 PM  Result Value Ref Range Status   Specimen Description ABSCESS  Final   Special Requests LEFT FRONTAL BRAIN MASS  Final   Acid Fast Smear   Final    NO ACID FAST BACILLI SEEN Performed at [facility]    Culture   Final    CULTURE WILL BE EXAMINED FOR 6 WEEKS BEFORE ISSUING A FINAL REPORT Performed at [facility]    Report Status PENDING  Incomplete  Culture, routine-abscess     Status: None (Preliminary result)   Collection Time: 05/16/15  4:30 PM  Result Value Ref Range Status   Specimen Description ABSCESS  Final   Special Requests LEFT FRONTAL BRAIN MASS  Final   Gram Stain   Final    MODERATE WBC PRESENT,BOTH PMN AND MONONUCLEAR NO SQUAMOUS EPITHELIAL CELLS SEEN ABUNDANT GRAM POSITIVE COCCI IN PAIRS IN CHAINS FEW GRAM POSITIVE RODS Performed at [facility]    Culture   Final    NO GROWTH 2 DAYS Performed at [facility]    Report Status PENDING  Incomplete  MRSA PCR Screening     Status: None   Collection Time: 05/16/15  6:40 PM  Result Value Ref Range Status   MRSA by PCR NEGATIVE NEGATIVE Final    Comment:        The GeneXpert MRSA Assay (FDA approved for NASAL specimens only), is one component of a comprehensive MRSA colonization surveillance program. It is not intended to diagnose MRSA infection nor to guide or monitor treatment for MRSA infections.   Culture, blood (routine x 2)      Status: None (Preliminary result)   Collection Time: 05/16/15  7:00 PM  Result Value Ref Range Status   Specimen Description BLOOD LEFT ANTECUBITAL  Final  Special Requests BOTTLES DRAWN AEROBIC AND ANAEROBIC 5CC  EA  Final   Culture   Final           BLOOD CULTURE RECEIVED NO GROWTH TO DATE CULTURE WILL BE HELD FOR 5 DAYS BEFORE ISSUING A FINAL NEGATIVE REPORT Performed at [facility]    Report Status PENDING  Incomplete  Culture, blood (routine x 2)     Status: None (Preliminary result)   Collection Time: 05/16/15  7:08 PM  Result Value Ref Range Status   Specimen Description BLOOD LEFT HAND  Final   Special Requests BOTTLES DRAWN AEROBIC ONLY 2CC  Final   Culture   Final           BLOOD CULTURE RECEIVED NO GROWTH TO DATE CULTURE WILL BE HELD FOR 5 DAYS BEFORE ISSUING A FINAL NEGATIVE REPORT Note: Culture results may be compromised due to an inadequate volume of blood received in culture bottles. Performed at [facility]    Report Status PENDING  Incomplete    Studies/Results: No results found.   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          Bobby Rumpf Infectious Diseases (pager) [phone] [email or web address] 05/19/2015, 12:23 PM  LOS: 6 days

## 2015-05-19 NOTE — Progress Notes (Signed)
[facility] PROGRESS NOTE   ARY LAVINE GEX:[id number] DOB: 1940-12-25 DOA: 05/13/2015 PCP: Noralee Space, MD  HPI/Subjective: Seen with wife at bedside, normal strength in 4 extremities no headache. Continue current antibiotics. And follow culture results.  Assessment/Plan: Principal Problem:   Brain mass Active Problems:   Hypothyroidism   HYPERCHOLESTEROLEMIA, MILD   OSLER-WEBER-RENDU DISEASE   GERD   Nocturnal leg cramps   Hypoxemia   Right sided weakness   Brain tumor   Cancer    Brain abscess Presented with headache and right lower extremity weakness. CT showed 32 cm brain mass with vasogenic cerebral edema and slight midline shift. Neurosurgery consulted and recommended initially steroids and metastatic cancer workup. CT of abdomen pelvis was done and showed no suspicious lesion for primary cancer. PSA is normal, normal colonoscopy in March 2012. Brain biopsy done on 5/27 showed pus which consistent with brain abscess. Gram stain showed GBC and GPR, on vancomycin, Rocephin and Flagyl.  No further growth on the specimen, continue current antibiotics. Insert PICC line per ID.  Hyponatremia Presented with sodium of 134 and sodium today is 129. 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 Decrease normal saline to [facility]. Check BMP.  Osler-Weber-Rendu syndrome Stable and chronic problem, per report he is prone to epistaxis. 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  Hypokalemia Replete with oral supplements.  Right leg weakness Secondary to brain mass, improved slightly after initiation of steroids.  Hypothyroidism Continue Synthroid, TSH was 4.38 on 04/03/2015   Code Status: Full Code Family Communication: Plan discussed with the patient. Disposition Plan: Remains  inpatient Diet: Diet Heart Room service appropriate?: Yes; Fluid consistency:: Thin  Consultants:  None  Procedures:  None  Antibiotics:  None   Objective: Filed Vitals:   05/19/15 0916  BP: 114/51  Pulse: 75  Temp: 98 F (36.7 C)  Resp: 18    Intake/Output Summary (Last 24 hours) at 05/19/15 1237 Last data filed at 05/19/15 0538  Gross per 24 hour  Intake    240 ml  Output   1075 ml  Net   -835 ml   Filed Weights   05/13/15 1815 05/18/15 2147  Weight: 82.4 kg (181 lb 10.5 oz) 83.371 kg (183 lb 12.8 oz)    Exam: General: Alert and awake, oriented x3, not in any acute distress. HEENT: anicteric sclera, pupils reactive to light and accommodation, EOMI CVS: S1-S2 clear, no murmur rubs or gallops Chest: clear to auscultation bilaterally, no wheezing, rales or rhonchi Abdomen: soft nontender, nondistended, normal bowel sounds, no organomegaly Extremities: no cyanosis, clubbing or edema noted bilaterally Neuro: Cranial nerves II-XII intact, no focal neurological deficits  Data Reviewed: Basic Metabolic Panel:  Recent Labs Lab 05/14/15 0319 05/15/15 0229 05/16/15 0320 05/16/15 1908 05/16/15 2310 05/17/15 0228 05/18/15 0225  NA 129* 134* 132*  --   --  133* 134*  K 4.5 3.9 4.2  --   --  3.9 3.2*  CL 97* 100* 103  --   --  102 97*  CO2 21* 23 23  --   --  24 29  GLUCOSE 126* 108* 114*  --   --  108* 88  BUN 11 17 21*  --   --  14 10  CREATININE 0.95 1.00 0.93 0.95  --  0.87 0.96  CALCIUM 9.0 8.8* 8.3*  --   --  7.9* 8.0*  MG  --   --   --   --  1.9  --   --    Liver Function Tests:  Recent Labs Lab 05/13/15 1201  AST 24  ALT 16*  ALKPHOS 60  BILITOT 0.8  PROT 6.6  ALBUMIN 3.9   No results for input(s): LIPASE, AMYLASE in the last 168 hours. No results for input(s): AMMONIA in the last 168 hours. CBC:  Recent Labs Lab 05/13/15 1201 05/14/15 0319 05/16/15 1908 05/18/15 0225 05/19/15 0933  WBC 10.2 8.9 14.4* 11.7* 11.9*  NEUTROABS 6.0   --   --   --   --   HGB 15.5 15.6 14.9 14.3 15.1  HCT 44.6 44.8 41.4 40.7 42.2  MCV 87.8 87.5 87.7 87.9 86.5  PLT 244 248 231 231 248   Cardiac Enzymes: No results for input(s): CKTOTAL, CKMB, CKMBINDEX, TROPONINI in the last 168 hours. BNP (last 3 results) No results for input(s): BNP in the last 8760 hours.  ProBNP (last 3 results) No results for input(s): PROBNP in the last 8760 hours.  CBG: No results for input(s): GLUCAP in the last 168 hours.  Micro Recent Results (from the past 240 hour(s))  Anaerobic culture     Status: None (Preliminary result)   Collection Time: 05/16/15  4:30 PM  Result Value Ref Range Status   Specimen Description ABSCESS  Final   Special Requests LEFT FRONTAL BRAIN MASS  Final   Gram Stain   Final    MODERATE WBC PRESENT,BOTH PMN AND MONONUCLEAR NO SQUAMOUS EPITHELIAL CELLS SEEN ABUNDANT GRAM POSITIVE COCCI IN PAIRS IN CHAINS FEW GRAM POSITIVE RODS Performed at [facility]    Culture   Final    NO ANAEROBES ISOLATED; CULTURE IN PROGRESS FOR 5 DAYS Performed at [facility]    Report Status PENDING  Incomplete  Fungus Culture with Smear     Status: None (Preliminary result)   Collection Time: 05/16/15  4:30 PM  Result Value Ref Range Status   Specimen Description ABSCESS  Final   Special Requests LEFT FRONTAL BRAIN MASS  Final   Fungal Smear   Final    NO YEAST OR FUNGAL ELEMENTS SEEN Performed at [facility]    Culture   Final    CULTURE IN PROGRESS FOR FOUR WEEKS Performed at [facility]    Report Status PENDING  Incomplete  Gram stain     Status: None   Collection Time: 05/16/15  4:30 PM  Result Value Ref Range Status   Specimen Description ABSCESS  Final   Special Requests LEFT FRONTAL BRAIN MASS  Final   Gram Stain   Final    MODERATE WBC PRESENT,BOTH PMN AND MONONUCLEAR ABUNDANT GRAM POSITIVE COCCI IN PAIRS IN CHAINS FEW GRAM POSITIVE RODS    Report Status 05/16/2015 FINAL  Final  AFB  culture with smear     Status: None (Preliminary result)   Collection Time: 05/16/15  4:30 PM  Result Value Ref Range Status   Specimen Description ABSCESS  Final   Special Requests LEFT FRONTAL BRAIN MASS  Final   Acid Fast Smear   Final    NO ACID FAST BACILLI SEEN Performed at [facility]    Culture   Final    CULTURE WILL BE EXAMINED FOR 6 WEEKS BEFORE ISSUING A FINAL REPORT Performed at [facility]    Report Status PENDING  Incomplete  Culture, routine-abscess     Status:  None (Preliminary result)   Collection Time: 05/16/15  4:30 PM  Result Value Ref Range Status   Specimen Description ABSCESS  Final   Special Requests LEFT FRONTAL BRAIN MASS  Final   Gram Stain   Final    MODERATE WBC PRESENT,BOTH PMN AND MONONUCLEAR NO SQUAMOUS EPITHELIAL CELLS SEEN ABUNDANT GRAM POSITIVE COCCI IN PAIRS IN CHAINS FEW GRAM POSITIVE RODS Performed at [facility]    Culture   Final    NO GROWTH 2 DAYS Performed at [facility]    Report Status PENDING  Incomplete  MRSA PCR Screening     Status: None   Collection Time: 05/16/15  6:40 PM  Result Value Ref Range Status   MRSA by PCR NEGATIVE NEGATIVE Final    Comment:        The GeneXpert MRSA Assay (FDA approved for NASAL specimens only), is one component of a comprehensive MRSA colonization surveillance program. It is not intended to diagnose MRSA infection nor to guide or monitor treatment for MRSA infections.   Culture, blood (routine x 2)     Status: None (Preliminary result)   Collection Time: 05/16/15  7:00 PM  Result Value Ref Range Status   Specimen Description BLOOD LEFT ANTECUBITAL  Final   Special Requests BOTTLES DRAWN AEROBIC AND ANAEROBIC 5CC  EA  Final   Culture   Final           BLOOD CULTURE RECEIVED NO GROWTH TO DATE CULTURE WILL BE HELD FOR 5 DAYS BEFORE ISSUING A FINAL NEGATIVE REPORT Performed at [facility]    Report Status PENDING  Incomplete  Culture, blood  (routine x 2)     Status: None (Preliminary result)   Collection Time: 05/16/15  7:08 PM  Result Value Ref Range Status   Specimen Description BLOOD LEFT HAND  Final   Special Requests BOTTLES DRAWN AEROBIC ONLY 2CC  Final   Culture   Final           BLOOD CULTURE RECEIVED NO GROWTH TO DATE CULTURE WILL BE HELD FOR 5 DAYS BEFORE ISSUING A FINAL NEGATIVE REPORT Note: Culture results may be compromised due to an inadequate volume of blood received in culture bottles. Performed at [facility]    Report Status PENDING  Incomplete     Studies: No results found.  Scheduled Meds: . cefTRIAXone (ROCEPHIN)  IV  2 g Intravenous Q12H  . heparin subcutaneous  5,000 Units Subcutaneous 3 times per day  . levETIRAcetam  500 mg Oral BID  . levothyroxine  125 mcg Oral QAC breakfast  . magnesium oxide  200 mg Oral QHS  . metroNIDAZOLE  500 mg Oral 3 times per day  . pantoprazole  40 mg Oral Daily  . sodium chloride  3 mL Intravenous Q12H  . tamsulosin  0.4 mg Oral QPC breakfast  . vancomycin  1,250 mg Intravenous Q12H  . vitamin C  2,000 mg Oral Daily   Continuous Infusions: . sodium chloride Stopped (05/18/15 1300)       Time spent: 35 minutes    [facility]  [facility] Pager [phone] If 7PM-7AM, please contact night-coverage at [email or web address], password [email or web address] 05/19/2015, 12:37 PM  LOS: 6 days

## 2015-05-20 ENCOUNTER — Encounter (HOSPITAL_COMMUNITY): Payer: Self-pay | Admitting: Neurosurgery

## 2015-05-20 LAB — BASIC METABOLIC PANEL
Anion gap: 8 (ref 5–15)
BUN: 12 mg/dL (ref 6–20)
CO2: 25 mmol/L (ref 22–32)
CREATININE: 1 mg/dL (ref 0.61–1.24)
Calcium: 7.7 mg/dL — ABNORMAL LOW (ref 8.9–10.3)
Chloride: 98 mmol/L — ABNORMAL LOW (ref 101–111)
GFR calc Af Amer: >60 mL/min (ref >60)
GFR calc non Af Amer: >60 mL/min (ref >60)
Glucose, Bld: 102 mg/dL — ABNORMAL HIGH (ref 65–99)
Potassium: 3.7 mmol/L (ref 3.5–5.1)
Sodium: 131 mmol/L — ABNORMAL LOW (ref 135–145)

## 2015-05-20 LAB — CBC
HEMATOCRIT: 40.9 % (ref 39.0–52.0)
Hemoglobin: 14.3 g/dL (ref 13.0–17.0)
MCH: 30.4 pg (ref 26.0–34.0)
MCHC: 35 g/dL (ref 30.0–36.0)
MCV: 87 fL (ref 78.0–100.0)
PLATELETS: 221 10*3/uL (ref 150–400)
RBC: 4.7 MIL/uL (ref 4.22–5.81)
RDW: 13 % (ref 11.5–15.5)
WBC: 11.5 10*3/uL — ABNORMAL HIGH (ref 4.0–10.5)

## 2015-05-20 LAB — CULTURE, ROUTINE-ABSCESS: Culture: NO GROWTH

## 2015-05-20 NOTE — Progress Notes (Signed)
[facility] PROGRESS NOTE   Gregory [name] POE:[id number] DOB: 03-06-1941 DOA: 05/13/2015 PCP: Noralee Space, MD  HPI/Subjective: No changes clinically, PT/OT to evaluate. No growth on the culture. Currently on Rocephin, vancomycin and Flagyl.  Barriers to discharge: Results of the abscess aspirate culture.  Assessment/Plan: Principal Problem:   Brain mass Active Problems:   Hypothyroidism   HYPERCHOLESTEROLEMIA, MILD   OSLER-WEBER-RENDU DISEASE   GERD   Nocturnal leg cramps   Hypoxemia   Right sided weakness   Brain tumor   Cancer    Brain abscess Presented with headache and right lower extremity weakness. CT showed 32 cm brain mass with vasogenic cerebral edema and slight midline shift. Neurosurgery consulted and recommended initially steroids and metastatic cancer workup. CT of abdomen pelvis was done and showed no suspicious lesion for primary cancer. PSA is normal, normal colonoscopy in March 2012. Brain biopsy done on 5/27 showed pus which consistent with brain abscess. Gram stain showed GBC and GPR, on vancomycin, Rocephin and Flagyl.  PICC line inserted, await final recommendation from ID, PT/OT to evaluate.  Hyponatremia Presented with sodium of 134 and sodium today is 129. 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  Osler-Weber-Rendu syndrome Stable and chronic problem, per report he is prone to epistaxis. 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  Hypokalemia Repleted with oral supplements.  Right leg weakness Secondary to brain mass, improved slightly after initiation of steroids.  Hypothyroidism Continue Synthroid, TSH was 4.38 on 04/03/2015   Code Status: Full Code Family Communication: Plan discussed with the patient. Disposition Plan: Remains inpatient Diet:  Diet Heart Room service appropriate?: Yes; Fluid consistency:: Thin  Consultants:  None  Procedures:  None  Antibiotics:  None   Objective: Filed Vitals:   05/20/15 1740  BP: 117/55  Pulse: 63  Temp: 98.1 F (36.7 C)  Resp: 18    Intake/Output Summary (Last 24 hours) at 05/20/15 1801 Last data filed at 05/20/15 0558  Gross per 24 hour  Intake      0 ml  Output   1000 ml  Net  -1000 ml   Filed Weights   05/13/15 1815 05/18/15 2147  Weight: 82.4 kg (181 lb 10.5 oz) 83.371 kg (183 lb 12.8 oz)    Exam: General: Alert and awake, oriented x3, not in any acute distress. HEENT: anicteric sclera, pupils reactive to light and accommodation, EOMI CVS: S1-S2 clear, no murmur rubs or gallops Chest: clear to auscultation bilaterally, no wheezing, rales or rhonchi Abdomen: soft nontender, nondistended, normal bowel sounds, no organomegaly Extremities: no cyanosis, clubbing or edema noted bilaterally Neuro: Cranial nerves II-XII intact, no focal neurological deficits  Data Reviewed: Basic Metabolic Panel:  Recent Labs Lab 05/16/15 0320 05/16/15 1908 05/16/15 2310 05/17/15 0228 05/18/15 0225 05/19/15 0933 05/20/15 0515  NA 132*  --   --  133* 134* 132* 131*  K 4.2  --   --  3.9 3.2* 3.8 3.7  CL 103  --   --  102 97* 99* 98*  CO2 23  --   --  24 29 23 25   GLUCOSE 114*  --   --  108* 88 162* 102*  BUN 21*  --   --  14 10 15 12   CREATININE 0.93 0.95  --  0.87 0.96 0.98 1.00  CALCIUM 8.3*  --   --  7.9*  8.0* 8.1* 7.7*  MG  --   --  1.9  --   --   --   --    Liver Function Tests: No results for input(s): AST, ALT, ALKPHOS, BILITOT, PROT, ALBUMIN in the last 168 hours. No results for input(s): LIPASE, AMYLASE in the last 168 hours. No results for input(s): AMMONIA in the last 168 hours. CBC:  Recent Labs Lab 05/14/15 0319 05/16/15 1908 05/18/15 0225 05/19/15 0933 05/20/15 0515  WBC 8.9 14.4* 11.7* 11.9* 11.5*  HGB 15.6 14.9 14.3 15.1 14.3  HCT 44.8 41.4  40.7 42.2 40.9  MCV 87.5 87.7 87.9 86.5 87.0  PLT 248 231 231 248 221   Cardiac Enzymes: No results for input(s): CKTOTAL, CKMB, CKMBINDEX, TROPONINI in the last 168 hours. BNP (last 3 results) No results for input(s): BNP in the last 8760 hours.  ProBNP (last 3 results) No results for input(s): PROBNP in the last 8760 hours.  CBG: No results for input(s): GLUCAP in the last 168 hours.  Micro Recent Results (from the past 240 hour(s))  Anaerobic culture     Status: None (Preliminary result)   Collection Time: 05/16/15  4:30 PM  Result Value Ref Range Status   Specimen Description ABSCESS  Final   Special Requests LEFT FRONTAL BRAIN MASS  Final   Gram Stain   Final    MODERATE WBC PRESENT,BOTH PMN AND MONONUCLEAR NO SQUAMOUS EPITHELIAL CELLS SEEN ABUNDANT GRAM POSITIVE COCCI IN PAIRS IN CHAINS FEW GRAM POSITIVE RODS Performed at [facility]    Culture   Final    NO ANAEROBES ISOLATED; CULTURE IN PROGRESS FOR 5 DAYS Performed at [facility]    Report Status PENDING  Incomplete  Fungus Culture with Smear     Status: None (Preliminary result)   Collection Time: 05/16/15  4:30 PM  Result Value Ref Range Status   Specimen Description ABSCESS  Final   Special Requests LEFT FRONTAL BRAIN MASS  Final   Fungal Smear   Final    NO YEAST OR FUNGAL ELEMENTS SEEN Performed at [facility]    Culture   Final    CULTURE IN PROGRESS FOR FOUR WEEKS Performed at [facility]    Report Status PENDING  Incomplete  Gram stain     Status: None   Collection Time: 05/16/15  4:30 PM  Result Value Ref Range Status   Specimen Description ABSCESS  Final   Special Requests LEFT FRONTAL BRAIN MASS  Final   Gram Stain   Final    MODERATE WBC PRESENT,BOTH PMN AND MONONUCLEAR ABUNDANT GRAM POSITIVE COCCI IN PAIRS IN CHAINS FEW GRAM POSITIVE RODS    Report Status 05/16/2015 FINAL  Final  AFB culture with smear     Status: None (Preliminary result)    Collection Time: 05/16/15  4:30 PM  Result Value Ref Range Status   Specimen Description ABSCESS  Final   Special Requests LEFT FRONTAL BRAIN MASS  Final   Acid Fast Smear   Final    NO ACID FAST BACILLI SEEN Performed at [facility]    Culture   Final    CULTURE WILL BE EXAMINED FOR 6 WEEKS BEFORE ISSUING A FINAL REPORT Performed at [facility]    Report Status PENDING  Incomplete  Culture, routine-abscess     Status: None   Collection Time: 05/16/15  4:30 PM  Result Value Ref Range Status   Specimen Description ABSCESS  Final   Special  Requests LEFT FRONTAL BRAIN MASS  Final   Gram Stain   Final    MODERATE WBC PRESENT,BOTH PMN AND MONONUCLEAR NO SQUAMOUS EPITHELIAL CELLS SEEN ABUNDANT GRAM POSITIVE COCCI IN PAIRS IN CHAINS FEW GRAM POSITIVE RODS Performed at [facility]    Culture   Final    NO GROWTH 3 DAYS Performed at [facility]    Report Status 05/20/2015 FINAL  Final  MRSA PCR Screening     Status: None   Collection Time: 05/16/15  6:40 PM  Result Value Ref Range Status   MRSA by PCR NEGATIVE NEGATIVE Final    Comment:        The GeneXpert MRSA Assay (FDA approved for NASAL specimens only), is one component of a comprehensive MRSA colonization surveillance program. It is not intended to diagnose MRSA infection nor to guide or monitor treatment for MRSA infections.   Culture, blood (routine x 2)     Status: None (Preliminary result)   Collection Time: 05/16/15  7:00 PM  Result Value Ref Range Status   Specimen Description BLOOD LEFT ANTECUBITAL  Final   Special Requests BOTTLES DRAWN AEROBIC AND ANAEROBIC 5CC  EA  Final   Culture   Final           BLOOD CULTURE RECEIVED NO GROWTH TO DATE CULTURE WILL BE HELD FOR 5 DAYS BEFORE ISSUING A FINAL NEGATIVE REPORT Performed at [facility]    Report Status PENDING  Incomplete  Culture, blood (routine x 2)     Status: None (Preliminary result)   Collection Time:  05/16/15  7:08 PM  Result Value Ref Range Status   Specimen Description BLOOD LEFT HAND  Final   Special Requests BOTTLES DRAWN AEROBIC ONLY 2CC  Final   Culture   Final           BLOOD CULTURE RECEIVED NO GROWTH TO DATE CULTURE WILL BE HELD FOR 5 DAYS BEFORE ISSUING A FINAL NEGATIVE REPORT Note: Culture results may be compromised due to an inadequate volume of blood received in culture bottles. Performed at [facility]    Report Status PENDING  Incomplete     Studies: No results found.  Scheduled Meds: . cefTRIAXone (ROCEPHIN)  IV  2 g Intravenous Q12H  . heparin subcutaneous  5,000 Units Subcutaneous 3 times per day  . levETIRAcetam  500 mg Oral BID  . levothyroxine  125 mcg Oral QAC breakfast  . magnesium oxide  200 mg Oral QHS  . metroNIDAZOLE  500 mg Oral 3 times per day  . pantoprazole  40 mg Oral Daily  . sodium chloride  3 mL Intravenous Q12H  . tamsulosin  0.4 mg Oral QPC breakfast  . vancomycin  1,250 mg Intravenous Q12H  . vitamin C  2,000 mg Oral Daily   Continuous Infusions: . sodium chloride Stopped (05/18/15 1300)       Time spent: 35 minutes    [facility] A  [facility] Pager [phone] If 7PM-7AM, please contact night-coverage at [email or web address], password [facility] 05/20/2015, 6:01 PM  LOS: 7 days

## 2015-05-20 NOTE — Progress Notes (Addendum)
INFECTIOUS DISEASE PROGRESS NOTE  ID: Gregory [name] is a 74 y.o. male with  Principal Problem:   Brain mass Active Problems:   Hypothyroidism   HYPERCHOLESTEROLEMIA, MILD   OSLER-WEBER-RENDU DISEASE   GERD   Nocturnal leg cramps   Hypoxemia   Right sided weakness   Brain tumor   Cancer  Subjective: Without complaints.   Abtx:  Anti-infectives    Start     Dose/Rate Route Frequency Ordered Stop   05/16/15 2200  metroNIDAZOLE (FLAGYL) tablet 500 mg     500 mg Oral 3 times per day 05/16/15 1741     05/16/15 2000  cefTRIAXone (ROCEPHIN) 2 g in dextrose 5 % 50 mL IVPB - Premix     2 g 100 mL/hr over 30 Minutes Intravenous Every 12 hours 05/16/15 1741     05/16/15 2000  vancomycin (VANCOCIN) 1,250 mg in sodium chloride 0.9 % 250 mL IVPB     1,250 mg 166.7 mL/hr over 90 Minutes Intravenous Every 12 hours 05/16/15 1839     05/16/15 1628  bacitracin 50,000 Units in sodium chloride irrigation 0.9 % 500 mL irrigation  Status:  Discontinued       As needed 05/16/15 1628 05/16/15 1712      Medications:  Scheduled: . cefTRIAXone (ROCEPHIN)  IV  2 g Intravenous Q12H  . heparin subcutaneous  5,000 Units Subcutaneous 3 times per day  . levETIRAcetam  500 mg Oral BID  . levothyroxine  125 mcg Oral QAC breakfast  . magnesium oxide  200 mg Oral QHS  . metroNIDAZOLE  500 mg Oral 3 times per day  . pantoprazole  40 mg Oral Daily  . sodium chloride  3 mL Intravenous Q12H  . tamsulosin  0.4 mg Oral QPC breakfast  . vancomycin  1,250 mg Intravenous Q12H  . vitamin C  2,000 mg Oral Daily    Objective: Vital signs in last 24 hours: Temp:  [97.8 F (36.6 C)-99 F (37.2 C)] 97.9 F (36.6 C) (05/31 0935) Pulse Rate:  [65-127] 77 (05/31 0935) Resp:  [18] 18 (05/31 0935) BP: (115-145)/(50-66) 118/66 mmHg (05/31 0935) SpO2:  [92 %-99 %] 96 % (05/31 0935)   General appearance: alert, cooperative and no distress Head: occipital wound is clean, staples in place  Lab  Results  Recent Labs  05/19/15 0933 05/20/15 0515  WBC 11.9* 11.5*  HGB 15.1 14.3  HCT 42.2 40.9  NA 132* 131*  K 3.8 3.7  CL 99* 98*  CO2 23 25  BUN 15 12  CREATININE 0.98 1.00   Liver Panel No results for input(s): PROT, ALBUMIN, AST, ALT, ALKPHOS, BILITOT, BILIDIR, IBILI in the last 72 hours. Sedimentation Rate No results for input(s): ESRSEDRATE in the last 72 hours. C-Reactive Protein No results for input(s): CRP in the last 72 hours.  Microbiology: Recent Results (from the past 240 hour(s))  Anaerobic culture     Status: None (Preliminary result)   Collection Time: 05/16/15  4:30 PM  Result Value Ref Range Status   Specimen Description ABSCESS  Final   Special Requests LEFT FRONTAL BRAIN MASS  Final   Gram Stain   Final    MODERATE WBC PRESENT,BOTH PMN AND MONONUCLEAR NO SQUAMOUS EPITHELIAL CELLS SEEN ABUNDANT GRAM POSITIVE COCCI IN PAIRS IN CHAINS FEW GRAM POSITIVE RODS Performed at [facility]    Culture   Final    NO ANAEROBES ISOLATED; CULTURE IN PROGRESS FOR 5 DAYS Performed at [facility]  Report Status PENDING  Incomplete  Fungus Culture with Smear     Status: None (Preliminary result)   Collection Time: 05/16/15  4:30 PM  Result Value Ref Range Status   Specimen Description ABSCESS  Final   Special Requests LEFT FRONTAL BRAIN MASS  Final   Fungal Smear   Final    NO YEAST OR FUNGAL ELEMENTS SEEN Performed at [facility]    Culture   Final    CULTURE IN PROGRESS FOR FOUR WEEKS Performed at [facility]    Report Status PENDING  Incomplete  Gram stain     Status: None   Collection Time: 05/16/15  4:30 PM  Result Value Ref Range Status   Specimen Description ABSCESS  Final   Special Requests LEFT FRONTAL BRAIN MASS  Final   Gram Stain   Final    MODERATE WBC PRESENT,BOTH PMN AND MONONUCLEAR ABUNDANT GRAM POSITIVE COCCI IN PAIRS IN CHAINS FEW GRAM POSITIVE RODS    Report Status 05/16/2015 FINAL  Final   AFB culture with smear     Status: None (Preliminary result)   Collection Time: 05/16/15  4:30 PM  Result Value Ref Range Status   Specimen Description ABSCESS  Final   Special Requests LEFT FRONTAL BRAIN MASS  Final   Acid Fast Smear   Final    NO ACID FAST BACILLI SEEN Performed at [facility]    Culture   Final    CULTURE WILL BE EXAMINED FOR 6 WEEKS BEFORE ISSUING A FINAL REPORT Performed at [facility]    Report Status PENDING  Incomplete  Culture, routine-abscess     Status: None   Collection Time: 05/16/15  4:30 PM  Result Value Ref Range Status   Specimen Description ABSCESS  Final   Special Requests LEFT FRONTAL BRAIN MASS  Final   Gram Stain   Final    MODERATE WBC PRESENT,BOTH PMN AND MONONUCLEAR NO SQUAMOUS EPITHELIAL CELLS SEEN ABUNDANT GRAM POSITIVE COCCI IN PAIRS IN CHAINS FEW GRAM POSITIVE RODS Performed at [facility]    Culture   Final    NO GROWTH 3 DAYS Performed at [facility]    Report Status 05/20/2015 FINAL  Final  MRSA PCR Screening     Status: None   Collection Time: 05/16/15  6:40 PM  Result Value Ref Range Status   MRSA by PCR NEGATIVE NEGATIVE Final    Comment:        The GeneXpert MRSA Assay (FDA approved for NASAL specimens only), is one component of a comprehensive MRSA colonization surveillance program. It is not intended to diagnose MRSA infection nor to guide or monitor treatment for MRSA infections.   Culture, blood (routine x 2)     Status: None (Preliminary result)   Collection Time: 05/16/15  7:00 PM  Result Value Ref Range Status   Specimen Description BLOOD LEFT ANTECUBITAL  Final   Special Requests BOTTLES DRAWN AEROBIC AND ANAEROBIC 5CC  EA  Final   Culture   Final           BLOOD CULTURE RECEIVED NO GROWTH TO DATE CULTURE WILL BE HELD FOR 5 DAYS BEFORE ISSUING A FINAL NEGATIVE REPORT Performed at [facility]    Report Status PENDING  Incomplete  Culture, blood (routine x  2)     Status: None (Preliminary result)   Collection Time: 05/16/15  7:08 PM  Result Value Ref Range Status   Specimen Description BLOOD LEFT HAND  Final   Special Requests BOTTLES DRAWN AEROBIC ONLY 2CC  Final   Culture   Final           BLOOD CULTURE RECEIVED NO GROWTH TO DATE CULTURE WILL BE HELD FOR 5 DAYS BEFORE ISSUING A FINAL NEGATIVE REPORT Note: Culture results may be compromised due to an inadequate volume of blood received in culture bottles. Performed at [facility]    Report Status PENDING  Incomplete    Studies/Results: No results found.   Assessment/Plan: Brain abscess- debrided 5-27 G/s GPC, GPR Osler Weber Rendu  Total days of antibiotics: 4 vanco/ceftriaxone/flagyl  Aerobic Cx is (-) Anaerobe is pending.  Needs PT Home health.  His Osler Weber Rendu predisposed him to brain abscess.  Will see him in ID clinic in 1 month.          Bobby Rumpf Infectious Diseases (pager) [phone] [email or web address] 05/20/2015, 10:43 AM  LOS: 7 days

## 2015-05-20 NOTE — Progress Notes (Signed)
No issues overnight. Pt c/o subjective, mild word-finding difficulty, otherwise doing well. No HA.  EXAM:  BP 118/66 mmHg  Pulse 77  Temp(Src) 97.9 F (36.6 C) (Oral)  Resp 18  Ht 5\' 10"  (1.778 m)  Wt 83.371 kg (183 lb 12.8 oz)  BMI 26.37 kg/m2  SpO2 96%  Awake, alert, oriented  Speech fluent, appropriate  CN grossly intact  5/5 BUE/BLE   IMPRESSION:  74 y.o. male POD# 4 s/p stereotactic biopsy of abscess  PLAN: - Cont abx per ID - Can f/u with me in 2 weeks for staple removal.

## 2015-05-21 DIAGNOSIS — E871 Hypo-osmolality and hyponatremia: Secondary | ICD-10-CM

## 2015-05-21 DIAGNOSIS — E038 Other specified hypothyroidism: Secondary | ICD-10-CM

## 2015-05-21 DIAGNOSIS — M6289 Other specified disorders of muscle: Secondary | ICD-10-CM

## 2015-05-21 DIAGNOSIS — Z5181 Encounter for therapeutic drug level monitoring: Secondary | ICD-10-CM

## 2015-05-21 LAB — ANAEROBIC CULTURE

## 2015-05-21 MED ORDER — PROMETHAZINE HCL 12.5 MG PO TABS: 12.5000 mg | ORAL_TABLET | ORAL | Status: DC | PRN

## 2015-05-21 MED ORDER — SODIUM CHLORIDE 0.9 % IV SOLN
1750.0000 mg | INTRAVENOUS | Status: DC
Start: 2015-05-21 — End: 2015-06-19

## 2015-05-21 MED ORDER — CEFTRIAXONE SODIUM IN DEXTROSE 40 MG/ML IV SOLN: 2.0000 g | Freq: Two times a day (BID) | INTRAVENOUS | Status: DC

## 2015-05-21 MED ORDER — METRONIDAZOLE 500 MG PO TABS: 500.0000 mg | ORAL_TABLET | Freq: Three times a day (TID) | ORAL | Status: DC

## 2015-05-21 MED ORDER — VANCOMYCIN HCL 10 G IV SOLR: 1250.0000 mg | Freq: Two times a day (BID) | INTRAVENOUS | Status: DC

## 2015-05-21 MED ORDER — LEVETIRACETAM 500 MG PO TABS: 500.0000 mg | ORAL_TABLET | Freq: Two times a day (BID) | ORAL | Status: DC

## 2015-05-21 NOTE — Care Management Note (Addendum)
Case Management Note  Patient Details  Name: Gregory [name] MRN: [id number] Date of Birth: [birth year]  Subjective/Objective:                    Action/Plan: Met with patient and wife to discuss home health needs. Patient is agreeable to discharging home with IV antibiotics and has chosen [facility] to provide RN services.  Miranda with [facility] was notified and has accepted the referral for discharge home today.  Patient's wife, [name], will be administering medications at home and can be reached via her cell [phone].  IV antibiotic scripts were placed on the patient's shadow chart.  Bedside RN updated.  Per patient's request, PO medication scripts were faxed to [facility] on [address].  Bedside RN aware.  Expected Discharge Date:                  Expected Discharge Plan:  [place]  In-House Referral:     Discharge planning Services  CM Consult  Post Acute Care Choice:    Choice offered to:  Patient  DME Arranged:    DME Agency:     HH Arranged:  RN, IV Antibiotics HH Agency:  [facility]  Status of Service:  Completed, signed off  Medicare Important Message Given:  Yes Date Medicare IM Given:  05/20/15 Medicare IM give by:  Lorne Skeens RN, MSN, CM Date Additional Medicare IM Given:    Additional Medicare Important Message give by:     If discussed at [place] of Stay Meetings, dates discussed:    Additional Comments:  Rolm Baptise, RN 05/21/2015, 1:07 PM

## 2015-05-21 NOTE — Discharge Instructions (Signed)

## 2015-05-21 NOTE — Progress Notes (Signed)
    [place] for Infectious Disease    Date of Admission:  05/13/2015   Total days of antibiotics 6        Day 6 vanco        Day 6 oral metro        Day 6 ceftriaxone bid   ID: Gregory [name] is a 74 y.o. male with polymicrobial brain abscess Principal Problem:   Brain abscess Active Problems:   Hypothyroidism   HYPERCHOLESTEROLEMIA, MILD   OSLER-WEBER-RENDU DISEASE   GERD   Nocturnal leg cramps   Hypoxemia   Brain mass   Right sided weakness    Subjective: Improved word finding difficulties. afebrile Medications:  . cefTRIAXone (ROCEPHIN)  IV  2 g Intravenous Q12H  . heparin subcutaneous  5,000 Units Subcutaneous 3 times per day  . levETIRAcetam  500 mg Oral BID  . levothyroxine  125 mcg Oral QAC breakfast  . magnesium oxide  200 mg Oral QHS  . metroNIDAZOLE  500 mg Oral 3 times per day  . pantoprazole  40 mg Oral Daily  . sodium chloride  3 mL Intravenous Q12H  . tamsulosin  0.4 mg Oral QPC breakfast  . vancomycin  1,250 mg Intravenous Q12H  . vitamin C  2,000 mg Oral Daily    Objective: Vital signs in last 24 hours: Temp:  [97.4 F (36.3 C)-99.1 F (37.3 C)] 97.5 F (36.4 C) (06/01 1347) Pulse Rate:  [63-77] 74 (06/01 1347) Resp:  [18-20] 20 (06/01 1347) BP: (115-132)/(51-60) 122/55 mmHg (06/01 1347) SpO2:  [90 %-97 %] 97 % (06/01 1347) Physical Exam  Constitutional: He is oriented to person, place, and time. He appears well-developed and well-nourished. No distress.  HENT:  Mouth/Throat: Oropharynx is clear and moist. No oropharyngeal exudate.  Cardiovascular: Normal rate, regular rhythm and normal heart sounds. Exam reveals no gallop and no friction rub.  No murmur heard.  Pulmonary/Chest: Effort normal and breath sounds normal. No respiratory distress. He has no wheezes.  Abdominal: Soft. Bowel sounds are normal. He exhibits no distension. There is no tenderness.  Lymphadenopathy:  He has no cervical adenopathy.  Neurological: He is alert and  oriented to person, place, and time.  Skin: slight surrounding erythema around sutures Psychiatric: He has a normal mood and affect. His behavior is normal.     Lab Results  Recent Labs  05/19/15 0933 05/20/15 0515  WBC 11.9* 11.5*  HGB 15.1 14.3  HCT 42.2 40.9  NA 132* 131*  K 3.8 3.7  CL 99* 98*  CO2 23 25  BUN 15 12  CREATININE 0.98 1.00    Microbiology:  Studies/Results: No results found.   Assessment/Plan: Polymicrobial brain abscess = will decrease dosage of vancomycin since it is likely supratherapeutic. Also change to daily dosing for ease. Will start at vanco 1750mg  IV daily starting tomorrow am. His last dose of 1250mg  was at 1300. Continue on ceftriaxone 2gm iv bid and oral metronidazole 500mg  TID  Therapeutic drug monitoring = it was not done today to see his level on teh 1250mg  Q12 dosing regimen. Will have home health check before 4th dose this Sunday am  Gave precautions to what to look for in regards to picc line infection, drug rash or diarrhea  Will see him back into ID clinic in 4 wk with [place], [facility] for Infectious Diseases Cell: [phone] Pager: [phone]  05/21/2015, 2:18 PM

## 2015-05-21 NOTE — Discharge Summary (Signed)
Physician Discharge Summary  Gregory [name] GEX:[id number] DOB: 14-Jun-1941 DOA: 05/13/2015  PCP: Noralee Space, MD  Admit date: 05/13/2015 Discharge date: 05/21/2015  Recommendations for Outpatient Follow-up:  1. Continue Rocephin, vancomycin and Flagyl for 1 month on discharge. 2. Please call infectious disease clinic to schedule an appointment in one month from discharge.  Discharge Diagnoses:  Principal Problem:   Brain abscess Active Problems:   Hypothyroidism   HYPERCHOLESTEROLEMIA, MILD   OSLER-WEBER-RENDU DISEASE   GERD   Nocturnal leg cramps   Hypoxemia   Brain mass   Right sided weakness    Discharge Condition: stable   Diet recommendation: as tolerated   History of present illness:  74 y.o. male, with a pmh of AVMs, recurrent epistaxis, hereditary hemorrhagic telangiectasias, and hypothyroidism who presented to [facility] with reports of right-sided lower extremity weakness and headache. His headaches have occasionally been controlled with Tylenol but on the morning prior to the admission he had associated difficulty moving right leg and difficulty walking which prompted him to come to emergency room for evaluation. Patient was subsequently found to have a brain mass 3 x 2 cm with midline shift and edema seen on CT head. Patient is status post stereotactic biopsy of an abscess, postoperative day 5 today 05/21/2015. Patient has been seen by infectious disease in consultation. Recommendation is to continue vancomycin, Rocephin and Flagyl for 1 month at the time of discharge and then to follow with infectious disease clinic.  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 - Patient was subsequently found to have basal ganglia mass with midline shift and edema seen on CT head with and without  contrast. - Patient was seen by neurosurgery in consultation and is status post stereotactic biopsy, postoperative day 5 today on 05/21/2015.  - Infectious disease was also consulted and per their recommendation patient was started on vancomycin, Rocephin and Flagyl which he will continue for 1 month on discharge  - Patient will also take Keppra on discharge 500 mg twice daily.  Hyponatremia - Presented with sodium of 134 and an sodium dropped down to 129.  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   Hypothyroidism - Continue Synthroid, TSH was 4.38 on 04/03/2015   Code Status: Full Code   Signed:  Leisa Lenz, MD  [facility] 05/21/2015, 9:39 AM  Pager #: [phone]  Time spent in minutes: more than 30 minutes  Discharge Exam: Filed Vitals:   05/21/15 0628  BP: 116/59  Pulse: 68  Temp: 98.5 F (36.9 C)  Resp: 20   Filed Vitals:   05/20/15 1740 05/20/15 2151 05/21/15 0157 05/21/15 0628  BP: 117/55 122/58 115/52 116/59  Pulse: 63 66 77 68  Temp: 98.1 F (36.7 C) 98.3 F (36.8 C) 99.1 F (37.3 C) 98.5 F (36.9 C)  TempSrc: Axillary Axillary Axillary Axillary  Resp: 18 19 19 20   Height:      Weight:      SpO2: 91% 93% 90% 93%    General: Pt is alert, follows commands appropriately, not in acute distress Cardiovascular: Regular rate and rhythm, S1/S2 +, no murmurs Respiratory: Clear to auscultation bilaterally, no wheezing,  no crackles, no rhonchi Abdominal: Soft, non tender, non distended, bowel sounds +, no guarding Extremities: no edema, no cyanosis, pulses palpable bilaterally DP and PT Neuro: Grossly nonfocal  Discharge Instructions  Discharge Instructions    Call MD for:  difficulty breathing, headache or visual disturbances    Complete by:   As directed      Call MD for:  persistant nausea and vomiting    Complete by:  As directed      Call MD for:  severe uncontrolled pain    Complete by:  As directed      Diet - low sodium heart healthy    Complete by:  As directed      Discharge instructions    Complete by:  As directed   1. Continue Rocephin, vancomycin and Flagyl for 1 month on discharge. 2. Please call infectious disease clinic to schedule an appointment in one month from discharge. 3. Please note new medication for seizure is Keppra 500 mg twice daily.     Increase activity slowly    Complete by:  As directed             Medication List    STOP taking these medications        ferrous sulfate 325 (65 FE) MG tablet      TAKE these medications        acetaminophen 325 MG tablet  Commonly known as:  TYLENOL  Take 650 mg by mouth daily as needed (pain).     cefTRIAXone 40 MG/ML IVPB  Commonly known as:  ROCEPHIN  Inject 50 mLs (2 g total) into the vein every 12 (twelve) hours.     clidinium-chlordiazePOXIDE 5-2.5 MG per capsule  Commonly known as:  LIBRAX  Take 1 capsule by mouth 3 (three) times daily as needed.     clorazepate 7.5 MG tablet  Commonly known as:  TRANXENE  TAKE 1 TABLET BY MOUTH 3 TIMES A DAY AS NEEDED     FIRST-DUKES MOUTHWASH Susp  1 tsp gargle and swallow four times daily     levETIRAcetam 500 MG tablet  Commonly known as:  KEPPRA  Take 1 tablet (500 mg total) by mouth 2 (two) times daily.     levothyroxine 125 MCG tablet  Commonly known as:  SYNTHROID, LEVOTHROID  TAKE 1 TABLET (125 MCG TOTAL) BY MOUTH DAILY BEFORE BREAKFAST.     loratadine 10 MG tablet  Commonly known as:  CLARITIN  Take 10 mg by mouth daily.     Magnesium 250 MG Tabs  Take 250 mg by mouth at bedtime.     meclizine 25 MG tablet  Commonly known as:  ANTIVERT  Take 1 tablet (25 mg total) by mouth daily as needed for dizziness.     metroNIDAZOLE 500 MG tablet  Commonly known as:  FLAGYL  Take 1  tablet (500 mg total) by mouth every 8 (eight) hours.     multivitamin with minerals Tabs tablet  Take 1 tablet by mouth daily. Centrum     pantoprazole 40 MG tablet  Commonly known as:  PROTONIX  Take 1 tablet (40 mg total) by mouth daily.     RAPAFLO 8 MG Caps capsule  Generic drug:  silodosin  TAKE ONE CAPSULE AT BEDTIME     vancomycin 1,250 mg in sodium chloride 0.9 % 250 mL  Inject 1,250 mg into the vein every 12 (twelve) hours.     vitamin C 1000 MG tablet  Take 2,000  mg by mouth daily.     Vitamin D3 2000 UNITS Tabs  Take 2,000 Units by mouth daily.           Follow-up Information    Follow up with [facility], NEELESH, C, MD In 2 weeks.   Specialty:  Neurosurgery   Contact information:   [address] [place] [address] [phone]       Follow up with NADEL,SCOTT M, MD. Schedule an appointment as soon as possible for a visit in 1 week.   Specialty:  Pulmonary Disease   Why:  Follow up appt after recent hospitalization   Contact information:   [place] [address] [phone]       Follow up with Bobby Rumpf, MD. Schedule an appointment as soon as possible for a visit in 1 month.   Specialty:  Infectious Diseases   Why:  Follow up appt after recent hospitalization   Contact information:   [place] [address] [phone]        The results of significant diagnostics from this hospitalization (including imaging, microbiology, ancillary and laboratory) are listed below for reference.    Significant Diagnostic Studies: Ct Head Wo Contrast  05/13/2015   ADDENDUM REPORT: 05/13/2015 15:27  ADDENDUM: There are small surgical clips within or near the posterior wall LEFT maxillary sinus/LEFT pterygopalatine fossa, in this patient with history of nose bleed. These are stable from prior scan 01/03/2003 and from 09/19/2008.   Electronically Signed   By: Rolla Flatten M.D.   On: 05/13/2015 15:27    05/13/2015   CLINICAL DATA:  RIGHT arm and leg weakness which began slightly over 24 hr ago.  EXAM: CT HEAD WITHOUT CONTRAST  TECHNIQUE: Contiguous axial images were obtained from the base of the skull through the vertex without intravenous contrast.  COMPARISON:  09/19/2008.  FINDINGS: There is a suspected intra-axial brain mass, with its epicenter LEFT lentiform nucleus, estimated cross-section 20 x 30 mm as seen on image 16 series 2. Fit Peripheral hyper attenuating rim, with low-attenuation central component. Marked surrounding vasogenic edema. Early left-to-right shift of 3 mm. Concern raised for primary or metastatic tumor. Abscess considered less likely. Calvarium intact. No sinus or mastoid disease.  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 Moderately extensive surrounding edema.  Findings discussed with triage nursing staff, Ria Comment. The patient will be escorted from the ED waiting room into an area where acute care can be provided.  Electronically Signed: By: Rolla Flatten M.D. On: 05/13/2015 13:10   Ct Head W Contrast  05/14/2015   CLINICAL DATA:  One-day history of RIGHT-sided weakness, mild headache. History of AVM, Osler-Weber-Rendu.  EXAM: CT HEAD WITH CONTRAST  TECHNIQUE: Contiguous axial images were obtained from the base of the skull through the vertex with intravenous contrast.  CONTRAST:  167mL OMNIPAQUE IOHEXOL 300 MG/ML  SOLN  COMPARISON:  CT head May 13, 2015 at 1301 hours and CT head September 19, 2008  FINDINGS: Stage irregular peripherally enhancing, centrally hypodense 3.1 x 1.9 cm mass in the LEFT corona radiata, basal ganglia shows extensive surrounding low-density vasogenic edema, resulting in 4 mm LEFT-to-RIGHT midline shift, similar. Partially effaced LEFT lateral ventricle without entrapment. No satellite areas of suspicious enhancement.  No acute large vascular territory infarcts. No abnormal extra-axial fluid collections, suspicious extra-axial  enhancement or extra-axial masses. Basal cisterns are patent.  Ocular globes and orbital contents are unremarkable. No destructive  bony lesions. Trace paranasal sinus mucosal thickening without air-fluid levels. The mastoid air cells are well aerated.  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  Stable 4 mm LEFT-to-RIGHT midline shift.   Electronically Signed   By: Elon Alas M.D.   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  CONTRAST:  116mL OMNIPAQUE IOHEXOL 300 MG/ML  SOLN  COMPARISON:  None.  FINDINGS: CT CHEST FINDINGS  There is a large multilobulated at least 7.2 x 3.8 x 3.4 cm AV malformation within right lower lobe (representative coronal image 73, series 5, axial image 50, series 2) which is supplied by a hypertrophied segmental branch of the right lower lobe pulmonary artery and draining via a hypertrophy right lower lobe pulmonary vein. This finding is without associated enlargement of the caliber the main pulmonary artery or significant cardiomegaly. There is no definitive mural thrombus within the AV malformation.  Minimal dependent subpleural ground-glass atelectasis. No focal airspace opacities. No pleural effusion or pneumothorax. The central pulmonary airways appear widely patent.  There is minimal biapical pleural parenchymal thickening. No discrete pulmonary nodules.  No mediastinal, hilar or  axillary lymphadenopathy.  No acute or aggressive osseous abnormalities within the chest.  Regional soft tissues appear normal. The thyroid gland appears diminutive.  Although this examination was not tailored for the evaluation the pulmonary arteries, there are no discrete filling defects within the central pulmonary arterial tree to suggest central pulmonary embolism.  Normal caliber of the thoracic aorta. Conventional configuration of the aortic arch. The branch vessels of the aortic arch appear widely patent throughout their imaged course. No definite thoracic aortic dissection or periaortic stranding on this nongated examination.  CT ABDOMEN AND PELVIS FINDINGS  Normal hepatic contour. Heterogeneous enhancement of the majority of the right lobe of the liver resolves on the provided portal venous phase imaging. This finding is associated with hypertrophy of the hepatic arterial system. A minimal amount of beaded irregularity is noted within the distal aspect of a anterior branch of the right hepatic artery (representative coronal images 48 and 49, series 5). While a discrete AV malformation is not identified, early shunting is noted through the left lobe of the liver.  Punctate (approximately 0.7 cm) hypoattenuating lesion within the subcapsular aspect of the left lobe of the liver (coronal image 21, series 5) is too small to adequately characterize though may represent a hepatic cyst. No additional discrete hepatic lesions identified.  Normal appearance of the gallbladder. No radiopaque gallstones. No intra extrahepatic biliary duct dilatation. No ascites.  There is symmetric enhancement and excretion of the bilateral kidneys. No definite renal stones in this postcontrast examination. No discrete renal lesions. No urinary obstruction or perinephric stranding. Normal appearance of the bilateral adrenal glands, pancreas and spleen. No perisplenic stranding.  Moderate colonic stool burden without evidence of  enteric obstruction. The bowel is normal in course and caliber without wall thickening. The appendix is not visualized, however there is no pericecal inflammatory change. No pneumoperitoneum, pneumatosis or portal venous gas. Small hiatal  hernia.  Scattered mixed calcified and noncalcified atherosclerotic plaque within a normal caliber abdominal aorta. The major branch vessels of the abdominal aorta appear patent on this non CTA examination. Note is made of a accessory right renal artery which supplies the inferior pole the right kidney. Incidental note is made of a circumaortic left renal vein.  No retroperitoneal, mesenteric, pelvic or inguinal lymphadenopathy.  Scattered calcifications within a normal sized prostate gland. Normal appearance of the urinary bladder given degree distention. No free fluid in the pelvic cul-de-sac.  No acute or aggressive osseous abnormalities within the abdomen or pelvis. 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 Mild rotatory scoliotic curvature of the thoracolumbar spine with dominant caudal component convex to the left.  Regional soft tissues appear normal.  IMPRESSION: Chest Impression:  1. 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 This large pulmonary AV malformation is without associated enlargement of the main pulmonary artery or definitive cardiomegaly. Abdomen and pelvis Impression:  1. No acute findings within the abdomen or pelvis. 2. Marked heterogeneous perfusion  of the right lobe of the liver. 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 This finding is without associated stigmata of portal venous hypertension, specifically, there is no splenomegaly, ascites or definitive gastroesophageal varices and thus these findings are of uncertain clinical significance. Clinical correlation with LFTs is recommended. 3. 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   Electronically Signed   By: Sandi Mariscal M.D.   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  CONTRAST:  112mL OMNIPAQUE IOHEXOL 300 MG/ML  SOLN  COMPARISON:  None.  FINDINGS: CT CHEST FINDINGS  There is a large multilobulated at least 7.2 x 3.8 x 3.4 cm AV malformation within right lower lobe (representative coronal image 73, series 5, axial image 50, series 2) which is supplied by a hypertrophied segmental branch of the right lower lobe pulmonary artery and draining via a hypertrophy right lower lobe pulmonary vein. This finding is without associated enlargement of the caliber the main pulmonary artery or significant cardiomegaly. There is no definitive mural thrombus within the AV malformation.  Minimal dependent subpleural ground-glass atelectasis. No focal airspace opacities. No pleural effusion or pneumothorax. The central pulmonary  airways appear widely patent.  There is minimal biapical pleural parenchymal thickening. No discrete pulmonary nodules.  No mediastinal, hilar or axillary lymphadenopathy.  No acute or aggressive osseous abnormalities within the chest.  Regional soft tissues appear normal. The thyroid gland  appears diminutive.  Although this examination was not tailored for the evaluation the pulmonary arteries, there are no discrete filling defects within the central pulmonary arterial tree to suggest central pulmonary embolism.  Normal caliber of the thoracic aorta. Conventional configuration of the aortic arch. The branch vessels of the aortic arch appear widely patent throughout their imaged course. No definite thoracic aortic dissection or periaortic stranding on this nongated examination.  CT ABDOMEN AND PELVIS FINDINGS  Normal hepatic contour. Heterogeneous enhancement of the majority of the right lobe of the liver resolves on the provided portal venous phase imaging. This finding is associated with hypertrophy of the hepatic arterial system. A minimal amount of beaded irregularity is noted within the distal aspect of a anterior branch of the right hepatic artery (representative coronal images 48 and 49, series 5). While a discrete AV malformation is not identified, early shunting is noted through the left lobe of the liver.  Punctate (approximately 0.7 cm) hypoattenuating lesion within the subcapsular aspect of the left lobe of the liver (coronal image 21, series 5) is too small to adequately characterize though may represent a hepatic cyst. No additional discrete hepatic lesions identified.  Normal appearance of the gallbladder. No radiopaque gallstones. No intra extrahepatic biliary duct dilatation. No ascites.  There is symmetric enhancement and excretion of the bilateral kidneys. No definite renal stones in this postcontrast examination. No discrete renal lesions. No urinary obstruction or perinephric stranding. Normal  appearance of the bilateral adrenal glands, pancreas and spleen. No perisplenic stranding.  Moderate colonic stool burden without evidence of enteric obstruction. The bowel is normal in course and caliber without wall thickening. The appendix is not visualized, however there is no pericecal inflammatory change. No pneumoperitoneum, pneumatosis or portal venous gas. Small hiatal hernia.  Scattered mixed calcified and noncalcified atherosclerotic plaque within a normal caliber abdominal aorta. The major branch vessels of the abdominal aorta appear patent on this non CTA examination. Note is made of a accessory right renal artery which supplies the inferior pole the right kidney. Incidental note is made of a circumaortic left renal vein.  No retroperitoneal, mesenteric, pelvic or inguinal lymphadenopathy.  Scattered calcifications within a normal sized prostate gland. Normal appearance of the urinary bladder given degree distention. No free fluid in the pelvic cul-de-sac.  No acute or aggressive osseous abnormalities within the abdomen or pelvis. 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 Mild rotatory scoliotic curvature of the thoracolumbar spine with dominant caudal component convex to the left.  Regional soft tissues appear normal.  IMPRESSION: Chest Impression:  1. 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 This large pulmonary AV malformation is without associated enlargement of the main pulmonary artery or  definitive cardiomegaly. Abdomen and pelvis Impression:  1. No acute findings within the abdomen or pelvis. 2. Marked heterogeneous perfusion of the right lobe of the liver. While a discrete AV malformation is not identified, there is marked hypertrophy of the hepatic arterial system with apparent early shunting through the left lobe of the liver - given history  of Osler-Weber-Rendu syndrome / Hereditary hemorrhagic telangiectasia, findings are worrisome for diffuse tiny AV malformations. This finding is without associated stigmata of portal venous hypertension, specifically, there is no splenomegaly, ascites or definitive gastroesophageal varices and thus these findings are of uncertain clinical significance. Clinical correlation with LFTs is recommended. 3. 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   Electronically Signed   By: Sandi Mariscal M.D.   On: 05/14/2015 00:04    Microbiology: Recent Results (from the past 240 hour(s))  Anaerobic culture     Status: None (Preliminary result)   Collection Time: 05/16/15  4:30 PM  Result Value Ref Range Status   Specimen Description ABSCESS  Final   Special Requests LEFT FRONTAL BRAIN MASS  Final   Gram Stain   Final    MODERATE WBC PRESENT,BOTH PMN AND MONONUCLEAR NO SQUAMOUS EPITHELIAL CELLS SEEN ABUNDANT GRAM POSITIVE COCCI IN PAIRS IN CHAINS FEW GRAM POSITIVE RODS Performed at [facility]    Culture   Final    NO ANAEROBES ISOLATED; CULTURE IN PROGRESS FOR 5 DAYS Performed at [facility]    Report Status PENDING  Incomplete  Fungus Culture with Smear     Status: None (Preliminary result)   Collection Time: 05/16/15  4:30 PM  Result Value Ref Range Status   Specimen Description ABSCESS  Final   Special Requests LEFT FRONTAL BRAIN MASS  Final   Fungal Smear   Final    NO YEAST OR FUNGAL ELEMENTS SEEN Performed at [facility]    Culture   Final    CULTURE IN  PROGRESS FOR FOUR WEEKS Performed at [facility]    Report Status PENDING  Incomplete  Gram stain     Status: None   Collection Time: 05/16/15  4:30 PM  Result Value Ref Range Status   Specimen Description ABSCESS  Final   Special Requests LEFT FRONTAL BRAIN MASS  Final   Gram Stain   Final    MODERATE WBC PRESENT,BOTH PMN AND MONONUCLEAR ABUNDANT GRAM POSITIVE COCCI IN PAIRS IN CHAINS FEW GRAM POSITIVE RODS    Report Status 05/16/2015 FINAL  Final  AFB culture with smear     Status: None (Preliminary result)   Collection Time: 05/16/15  4:30 PM  Result Value Ref Range Status   Specimen Description ABSCESS  Final   Special Requests LEFT FRONTAL BRAIN MASS  Final   Acid Fast Smear   Final    NO ACID FAST BACILLI SEEN Performed at [facility]    Culture   Final    CULTURE WILL BE EXAMINED FOR 6 WEEKS BEFORE ISSUING A FINAL REPORT Performed at [facility]    Report Status PENDING  Incomplete  Culture, routine-abscess     Status: None   Collection Time: 05/16/15  4:30 PM  Result Value Ref Range Status   Specimen Description ABSCESS  Final   Special Requests LEFT FRONTAL BRAIN MASS  Final   Gram Stain   Final    MODERATE WBC PRESENT,BOTH PMN AND MONONUCLEAR NO SQUAMOUS EPITHELIAL CELLS SEEN ABUNDANT GRAM POSITIVE COCCI IN PAIRS IN CHAINS FEW GRAM POSITIVE RODS Performed at [facility]    Culture   Final    NO GROWTH 3 DAYS Performed at [facility]    Report Status 05/20/2015 FINAL  Final  MRSA PCR Screening     Status: None   Collection Time: 05/16/15  6:40 PM  Result Value Ref Range Status   MRSA by PCR NEGATIVE NEGATIVE Final  Culture, blood (routine x 2)     Status: None (Preliminary result)   Collection Time: 05/16/15  7:00 PM  Result Value Ref Range Status   Specimen Description BLOOD LEFT ANTECUBITAL  Final   Special Requests BOTTLES DRAWN AEROBIC AND ANAEROBIC 5CC  EA  Final   Culture   Final           BLOOD CULTURE  RECEIVED NO GROWTH TO DATE CULTURE WILL BE HELD FOR 5 DAYS BEFORE ISSUING A FINAL NEGATIVE REPORT Performed at [facility]    Report Status PENDING  Incomplete  Culture, blood (routine x 2)     Status: None (Preliminary result)   Collection Time: 05/16/15  7:08 PM  Result Value Ref Range Status   Specimen Description BLOOD LEFT HAND  Final   Special Requests BOTTLES DRAWN AEROBIC ONLY 2CC  Final   Culture   Final           BLOOD CULTURE RECEIVED NO GROWTH TO DATE CULTURE WILL BE HELD FOR 5 DAYS BEFORE ISSUING A FINAL NEGATIVE REPORT Note: Culture results may be compromised due to an inadequate volume of blood received in culture bottles. Performed at [facility]    Report Status PENDING  Incomplete     Labs: Basic Metabolic Panel:  Recent Labs Lab 05/16/15 0320 05/16/15 1908 05/16/15 2310 05/17/15 0228 05/18/15 0225 05/19/15 0933 05/20/15 0515  NA 132*  --   --  133* 134* 132* 131*  K 4.2  --   --  3.9 3.2* 3.8 3.7  CL 103  --   --  102 97* 99* 98*  CO2 23  --   --  24 29 23 25   GLUCOSE 114*  --   --  108* 88 162* 102*  BUN 21*  --   --  14 10 15 12   CREATININE 0.93 0.95  --  0.87 0.96 0.98 1.00  CALCIUM 8.3*  --   --  7.9* 8.0* 8.1* 7.7*  MG  --   --  1.9  --   --   --   --    Liver Function Tests: No results for input(s): AST, ALT, ALKPHOS, BILITOT, PROT, ALBUMIN in the last 168 hours. No results for input(s): LIPASE, AMYLASE in the last 168 hours. No results for input(s): AMMONIA in the last 168 hours. CBC:  Recent Labs Lab 05/16/15 1908 05/18/15 0225 05/19/15 0933 05/20/15 0515  WBC 14.4* 11.7* 11.9* 11.5*  HGB 14.9 14.3 15.1 14.3  HCT 41.4 40.7 42.2 40.9  MCV 87.7 87.9 86.5 87.0  PLT 231 231 248 221   Cardiac Enzymes: No results for input(s): CKTOTAL, CKMB, CKMBINDEX, TROPONINI in the last 168 hours. BNP: BNP (last 3 results) No results for input(s): BNP in the last 8760 hours.  ProBNP (last 3 results) No results for input(s):  PROBNP in the last 8760 hours.  CBG: No results for input(s): GLUCAP in the last 168 hours.

## 2015-05-21 NOTE — Evaluation (Signed)
Physical Therapy Evaluation Patient Details Name: Gregory [name] MRN: [id number] DOB: 1941-05-17 Today's Date: 05/21/2015   History of Present Illness  The patient is a 74 yo M with history of AVMs and hypothyroidism who presented with headaches and new onset weakness on the right side. 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   Clinical Impression  Pt admitted with above diagnosis. Pt currently with functional limitations due to the deficits listed below (see PT Problem List).  Pt will benefit from skilled PT to increase their independence and safety with mobility to allow discharge to the venue listed below.  Pt did well with PT eval. No difference noted in strength between legs.  Ambulating with RW with S and min/guard to S without AD. Discussed standing home therex program to do at home.  Do not feel pt needs any follow up.  Pt and wife in agreement at this time.     Follow Up Recommendations No PT follow up;Supervision - Intermittent    Equipment Recommendations  None recommended by PT    Recommendations for Other Services       Precautions / Restrictions Precautions Precautions: Fall Restrictions Weight Bearing Restrictions: No      Mobility  Bed Mobility Overal bed mobility: Modified Independent                Transfers Overall transfer level: Modified independent   Transfers: Sit to/from Stand           General transfer comment: good use of UE with no LOB  Ambulation/Gait Ambulation/Gait assistance: Min guard;Supervision Ambulation Distance (Feet): 350 Feet Assistive device: None;Rolling walker (2 wheeled) Gait Pattern/deviations: Step-through pattern     General Gait Details: Initially wanted to ambulate with RW and demonstrated good balance. Last 125' without AD with step through pattern with slight increase in guarded pattern  Stairs Stairs:  Yes Stairs assistance: Min guard Stair Management: One rail Left;Alternating pattern Number of Stairs: 3 General stair comments: Alternating pattern with deliberate steps  Wheelchair Mobility    Modified Rankin (Stroke Patients Only)       Balance Overall balance assessment: Needs assistance   Sitting balance-Leahy Scale: Good       Standing balance-Leahy Scale: Good Standing balance comment: Good Marland KitchenMarice Potter +               High Level Balance Comments: Pt able to step backwards and turn in tight space, occasional odd placement of feet, almost like a neuropathy, but denies altered sensation and states it is just cause he has been in bed for so many days.             Pertinent Vitals/Pain Pain Assessment: No/denies pain    Home Living Family/patient expects to be discharged to:: Private residence Living Arrangements: Spouse/significant other Available Help at Discharge: Available 24 hours/day;Family Type of Home: House Home Access: Stairs to enter Entrance Stairs-Rails: Left Entrance Stairs-Number of Steps: 4 Home Layout: One level Home Equipment: Walker - 2 wheels;Walker - 4 wheels      Prior Function Level of Independence: Independent               Hand Dominance   Dominant Hand: Right    Extremity/Trunk Assessment               Lower Extremity Assessment: Overall WFL for tasks assessed;RLE deficits/detail;LLE deficits/detail RLE Deficits / Details:  Hip flex 4/5 LLE Deficits / Details: hip flex 4/5     Communication   Communication: No difficulties  Cognition Arousal/Alertness: Awake/alert Behavior During Therapy: WFL for tasks assessed/performed Overall Cognitive Status: Within Functional Limits for tasks assessed                      General Comments General comments (skin integrity, edema, etc.): Wife present.    Exercises        Assessment/Plan    PT Assessment Patient needs continued PT services  PT Diagnosis  Difficulty walking   PT Problem List Decreased balance;Decreased mobility  PT Treatment Interventions Gait training;Stair training;Functional mobility training;Therapeutic activities;Therapeutic exercise   PT Goals (Current goals can be found in the Care Plan section) Acute Rehab PT Goals Patient Stated Goal: go home PT Goal Formulation: With patient/family Time For Goal Achievement: 05/28/15 Potential to Achieve Goals: Good    Frequency Min 3X/week   Barriers to discharge        Co-evaluation               End of Session Equipment Utilized During Treatment: Gait belt Activity Tolerance: Patient tolerated treatment well Patient left: in chair;with call bell/phone within reach;with family/visitor present           Time: 0939-1009 PT Time Calculation (min) (ACUTE ONLY): 30 min   Charges:   PT Evaluation $Initial PT Evaluation Tier I: 1 Procedure PT Treatments $Gait Training: 8-22 mins   PT G Codes:        Tenasia Aull LUBECK 05/21/2015, 10:25 AM

## 2015-05-21 NOTE — Progress Notes (Signed)
ANTIBIOTIC CONSULT NOTE - FOLLOW UP  Pharmacy Consult for Vancomycin Indication: brain abscess  Allergies  Allergen Reactions  . Aspirin Other (See Comments)    nosebleeds  . Statins Other (See Comments)    intolerance  . Zocor [Simvastatin - High Dose] Other (See Comments)    Leg cramps    Patient Measurements: Height: 5\' 10"  (177.8 cm) Weight: 183 lb 12.8 oz (83.371 kg) IBW/kg (Calculated) : 73  Vital Signs: Temp: 97.4 F (36.3 C) (06/01 0948) Temp Source: Axillary (06/01 0948) BP: 119/51 mmHg (06/01 0948) Pulse Rate: 70 (06/01 0948) Intake/Output from previous day: 05/31 0701 - 06/01 0700 In: -  Out: 150 [Urine:150] Intake/Output from this shift:    Labs:  Recent Labs  05/19/15 0933 05/20/15 0515  WBC 11.9* 11.5*  HGB 15.1 14.3  PLT 248 221  CREATININE 0.98 1.00   Estimated Creatinine Clearance: 68.9 mL/min (by C-G formula based on Cr of 1).  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 Last level drawn on 5/29 was therapeutic on 1250mg  q12. Renal function has been stable.  5/27 vancomycin>> VT 5/29 = 15 on 1250mg  IV q12hr 5/27 rocephin>> 5/27 flagyl>>  5/27 abscess cx - neg 5/27 anaerobes - ngtd 5/27 fungus - ngtd 5/27 AFB - ngtd 5/27 Blood x 2 - ngtd  Goal of Therapy:  Vancomycin trough level 15-20 mcg/ml  Plan:  1) Continue vancomycin 1250mg  IV q12  Deboraha Sprang 05/21/2015,10:10 AM

## 2015-05-22 ENCOUNTER — Telehealth: Payer: Self-pay | Admitting: Pulmonary Disease

## 2015-05-22 NOTE — Telephone Encounter (Signed)
Spoke with spouse. D/t nurses coming out she scheduled appt for 6/15 at 12. Nothing further needed

## 2015-05-22 NOTE — Progress Notes (Signed)
Pt for discharge home today.  Discharge instructions given. PO medication scripts were faxed to [facility] on [address]. [facility] nurseMiranda with [facility] met with patient and wife Gregory [name] (who will be administering IV medications at home) prior to discharge.[facility] staff will meet with them at home in the morning for IV administration. Volunteer brought patient to lobby via wheelchair at 1600. Transported to home by wife.

## 2015-05-23 DIAGNOSIS — Z5181 Encounter for therapeutic drug level monitoring: Secondary | ICD-10-CM | POA: Insufficient documentation

## 2015-05-23 LAB — CULTURE, BLOOD (ROUTINE X 2)
CULTURE: NO GROWTH
Culture: NO GROWTH

## 2015-05-27 ENCOUNTER — Encounter (HOSPITAL_COMMUNITY): Payer: Self-pay | Admitting: Neurosurgery

## 2015-05-27 ENCOUNTER — Telehealth: Payer: Self-pay | Admitting: Pulmonary Disease

## 2015-05-27 NOTE — Telephone Encounter (Signed)
Wife states pt is having a bad cough that is dry that is causing pt's ribs to be sore. She is asking with the seizure medication and the multiple abx he is taking po and through his PICC line what can he take for the cough?  SN please advise.

## 2015-05-28 MED ORDER — HYDROCOD POLST-CPM POLST ER 10-8 MG/5ML PO SUER: 5.0000 mL | Freq: Two times a day (BID) | ORAL | Status: DC | PRN

## 2015-05-28 NOTE — Telephone Encounter (Signed)
SN please advise. thanks 

## 2015-05-28 NOTE — Telephone Encounter (Signed)
Per SN, rx for tussionex 54ml PO Q12PRN cough to be given to pt. Called and spoke with pt's wife. Wife stated that she would stop by the office today or tomorrow to pick up script. Informed her i would place the order up front to be picked up. Nothing further is needed

## 2015-05-30 ENCOUNTER — Other Ambulatory Visit (HOSPITAL_COMMUNITY): Payer: Self-pay | Admitting: Neurosurgery

## 2015-05-30 DIAGNOSIS — R609 Edema, unspecified: Secondary | ICD-10-CM

## 2015-06-02 ENCOUNTER — Other Ambulatory Visit: Payer: Self-pay | Admitting: Internal Medicine

## 2015-06-02 DIAGNOSIS — R112 Nausea with vomiting, unspecified: Secondary | ICD-10-CM

## 2015-06-03 ENCOUNTER — Ambulatory Visit (HOSPITAL_COMMUNITY)
Admission: RE | Admit: 2015-06-03 | Discharge: 2015-06-03 | Disposition: A | Discharge status: Home / Self Care | Payer: Medicare Other | Source: Ambulatory Visit | Attending: Neurosurgery | Admitting: Neurosurgery

## 2015-06-03 DIAGNOSIS — R609 Edema, unspecified: Secondary | ICD-10-CM

## 2015-06-03 DIAGNOSIS — M7989 Other specified soft tissue disorders: Secondary | ICD-10-CM | POA: Diagnosis not present

## 2015-06-03 NOTE — Progress Notes (Signed)
*  Preliminary Results* Right lower extremity venous duplex completed. Right lower extremity is negative for deep vein thrombosis. There is no evidence of right Baker's cyst.  Preliminary results discussed with Butch Penny of Dr. Cleotilde Neer office.  06/03/2015 11:23 AM  Maudry Mayhew, RVT, RDCS, RDMS

## 2015-06-04 ENCOUNTER — Telehealth: Payer: Self-pay | Admitting: *Deleted

## 2015-06-04 ENCOUNTER — Ambulatory Visit (INDEPENDENT_AMBULATORY_CARE_PROVIDER_SITE_OTHER): Payer: Medicare Other | Admitting: Pulmonary Disease

## 2015-06-04 VITALS — BP 122/74 | HR 68 | Temp 98.0°F | Wt 182.4 lb

## 2015-06-04 DIAGNOSIS — R05 Cough: Secondary | ICD-10-CM | POA: Diagnosis not present

## 2015-06-04 DIAGNOSIS — I780 Hereditary hemorrhagic telangiectasia: Secondary | ICD-10-CM | POA: Diagnosis not present

## 2015-06-04 DIAGNOSIS — G060 Intracranial abscess and granuloma: Secondary | ICD-10-CM

## 2015-06-04 DIAGNOSIS — R053 Chronic cough: Secondary | ICD-10-CM

## 2015-06-04 DIAGNOSIS — R112 Nausea with vomiting, unspecified: Secondary | ICD-10-CM | POA: Diagnosis not present

## 2015-06-04 MED ORDER — PROMETHAZINE HCL 12.5 MG PO TABS: 12.5000 mg | ORAL_TABLET | ORAL | Status: DC | PRN

## 2015-06-04 NOTE — Telephone Encounter (Signed)
Yes, please give 2 refills.

## 2015-06-04 NOTE — Telephone Encounter (Signed)
Thanks

## 2015-06-04 NOTE — Patient Instructions (Signed)
Today we updated your med list in our EPIC system...    Continue your current medications the same...  We refilled your Phenergan per request...  Use the Incentive Spirometer to expand your lung bases...  Call for any questions or if we can be of service in any way.Marland KitchenMarland Kitchen

## 2015-06-04 NOTE — Telephone Encounter (Signed)
Patient is scheduled for hospital follow up 6/30 with Dr. Megan Salon.  At discharge on 6/1 he was given rx for promethazine 12.5mg  #30 R0 with instructions to take 1-2 tablets q4 hours as needed for nausea or vomiting. This was written by Dr. Baxter Flattery. Please advise if it is ok to refill, as patient has not yet been seen.  Landis Gandy, RN

## 2015-06-05 ENCOUNTER — Encounter: Payer: Self-pay | Admitting: Pulmonary Disease

## 2015-06-05 NOTE — Progress Notes (Signed)
Subjective:    Patient ID: Gregory [name], male    DOB: [birth year], 74 y.o.   MRN: [id number]  HPI 74 y/o WM here for a follow up visit... he has multiple medical problems including:  Hx OWR syndrome;  Hx atypCP;  Hyperchol;  Hypothyroidism;  GERD/ Divertics/ IBS/ Colon polyps;  BPH/ BOO;  DJD/ LBP/ Plantar fasciitis;  Hx dizziness;  Anxiety & chronic persistant insomnia... 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  LABS 10/14:  FLP- on diet alone looks pretty good w/ LDL=111...  ~  November 21, 2013:  6wk [name] & add-on appt after cardiac eval; he notes incr DOE w/ stairs over the last few months and saw Cards- DrCrenshaw10/14 (note reviewed, last eval 2009 w/ Echo & Myoview); exam was neg & they decided to repeat the studies;  EKG 10/14 showed SBrady, rate57, otherw wnl;  2DEcho 10/14 showed normal LV size & wall thickness, norm LVF w/ EF=55-60%, Gr1DD, mildly thickened AoV leaflets, mild MR;  ETT 11/14 showed 419mn on treadmill, hypertensive BP response, rare PVC, no ischemia, O2 sats dropped to 87% w/ exercise...  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 Given this mild airflow obstruction we will start Rx w/ Dulera100- 2spBid and plan f/u in 2 mo... Rec to start a gradual exercise program...  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  We decided to continue the same Rx & plan for now...  ~  April 10, 2014:  19m34moV & post ER check> pt was seen by TP w/ gastroent, n/v, weakness, postural, & hypoxemic=> sent to ER for IVF & improved, sent home w/ Zofran and symptoms resolved... He is currently feeling well & continues to improve w/ his Dulera and exercise program- sl SOB at top of stairs but improving; exercising on treadmill regularly, still working w/ [employer] but planning on retirement by end of the yr... 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    Hx Cough, mild  COPD, AVM in RLL area w/ some hypoxemia during exercise> on Dulera100-2spBid; breathing improved w/ exercise; O2 sat= 94% on RA today; c/o dry throat & encouraged to use lozenges...    Hx CP>  Prev cardiac w/u by Hilary Hertz was neg, no recurrent CP complaints, he remains active but notes DOE w/ stairs, ok on level ground, 2DEcho & GXT were OK x O2 sat drop to 87% at peak exercise- ?incr shunting thru his RLL AVM...    Chol> off Crestor now due to musc cramps; he prev stopped Simva40 due to muscle cramps as well; FLP 10/14 on diet alone showed TChol 155, TG 29, HDL 38, LDL 111...    Hypothy>  On Synthroid 145mg/d now>  Labs 4/12 revealed TSH= 0.33 on Synthroid125 so we decr the dose slightly  to 112;  f/u TSH= 4.60 & 5.46...    GI- GERD, IBS, Polyps> on Prilosec40Qd & Zofran/ Librax prn; [facility]- neg x for hems & DrM rec f/u 523yr..    GU>  Hx BPH w/ BOO on Rapaflo 71m54m per DrEskridge; last seen 1/15 & doing satis w/ PSA= 2.34    Ortho>  On Mobic7.5 prn + OTC meds; eval 12/11 by DrNorris for bilat leg pain & cramping, ?min atrophy of left gastroc- he rec tonic water & neurology eval> he saw DrWillis 3/12 (note reviewed)> NCV was normal; EMG c/w mild S1 radiculopathy; he rec MRI- pt decided against the MRI (we did not get f/u note)...    Anxiety/ Insomnia>  He has Chlorazepate7.5 for Prn use, and Ambien10 to use for sleep Qhs... We reviewed prob list, meds, xrays and labs> see below for updates >>   CXR 3/15 showed normal heart size, clear lungs, mild apical scarring, right basilar density c/w AVM based on CT report 2004, NAD... Marland KitchenMarland KitchenKG 3/15 showed NSR, rate87, low volt, NAD...  ADDENDUM> LABS 4/15 showed>  FLP- TChol 137, TG 30, HDL 36, LDL 95;  Chems- wnl;  CBC- anemic w/ Hg=11.3 & MCV=77;  TSH=9.26... REC>  Ret for Iron panel (Fe=16- 4%sat), Ferritin (6.4), Stool cards (pending); then start FeSO4-325m90m w/ VitC500... Recheck CBC, Iron level in 6-8weeks...             Taking Synthroid112 regularly, therefore increase back to 125mc84m.. Recheck TSH in 62mo=>70monever returned.  ~  October 08, 2014:  70mo RO24moBob is Gregory [name] working but has cut back to 2d/wk; feeling better overall and most of his dyspnea symptoms resolved w/ FeSO4 7 return of his Hg to normal; energy is good & he exercises on treadmill, doing better on stairs, etc; he stopped the Dulera [facility] breathing improved back to baseline...  We reviewed the following medical problems during today's office visit >>     OWR>  He has Osler-Weber-Rendu syndrome w/ mult telangiectasias (skin, lips, nasal w/ prev nose bleeds- mult surg & occluded nares, AVM in RLL on CXR w/o hemoptysis);  Stable- doing  satis w/o recurrent bleeding...    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    Hx CP>  Prev cardiac w/u by DrCrensHilary Hertzg, no recurrent CP complaints, he remains active but notes DOE w/ stairs, ok on level ground, 2DEcho & GXT were OK x O2 sat drop to 87% at peak  exercise- ?incr shunting thru his RLL AVM...    Chol> Intol to Simva40 & Cres5 due to musc cramps; FLP 4/15 on diet alone showed TChol 137, TG 30, HDL 36, LDL 95...    Hypothy>  On Synthroid 141mg/d now>  Labs 10/15 revealed TSH= 5.10 & clinically euthyroid, continue same...    GI- GERD, IBS, Polyps> on Prilosec40Qd & Zofran/ Librax prn; [facility] note reviewed (heme pos stool & anemia from epistaxis & NSAIDs); he had colonoscopy 3/12- neg x for hems & DrM rec f/u 572yr..    GU>  Hx BPH w/ BOO on Rapaflo 16m66m per DrEskridge; last seen 1/15 & doing satis w/ PSA= 2.34    Ortho> on Mobic7.5 prn + OTC meds; eval 12/11 by DrNorris for bilat leg pain & cramping, ?min atrophy of left gastroc- he rec tonic water & neurology eval> he saw DrWillis 3/12 (note reviewed)> NCV was normal; EMG c/w mild S1 radiculopathy; he rec MRI- pt decided against the MRI (we did not get f/u note)...    Anxiety/ Insomnia>  He has Chlorazepate7.5 for Prn use, and Ambien10 to use for sleep Qhs... We reviewed prob list, meds, xrays and labs> see below for updates >> OK 2015 Flu shot today...   LABS 10/15:  CBC- wnl w/ Hg= 16.5, Fe=88 (23%sat), Ferritin=36;  TSH=5.10... REC>  Continue Iron supplement daily, and the Synthroid125/d...   ~  April 01, 2015:  55mo57mo & [name] reports a good interval>  He has notes some intermittent & persistent nose bleeds due to his OWR & nasal obstruction, he uses nasal saline & will f/u w/ ENT- DrRosen... He also notes some discomfort in  knees, hips, back but he can't take strong NSAIDs due to OWR & risk of GI bleeds; we discussed trial of Tramadol50 w/ tylenol to see how this works on a prn basis... His 3rd complaint is his chronic persistent insomnia- only sleeping 4h per night & difficulty getting back to sleep; we reviewed a few strategies to deal w/ this- he notes that Tranxene taken when he arises will help him go back to sleep 7 no hangover- this is a win-win strategy & he will continue... 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    Hx CP>  Prev cardiac w/u by DrCrHilary Hertz neg, no recurrent CP- he remains active but notes DOE w/ stairs, ok on level ground, 2DEcho & GXT were OK x O2 sat drop to 87% at peak exercise- ?incr shunting thru his RLL AVM...    Chol> Intol to Simva40 & Cres5 due to musc cramps; FLP 4/16 on diet alone showed TChol 146, TG 85, HDL 35, LDL 94...    Hypothy>  On Synthroid 125mc76mnow>  Labs 4/16 revealed TSH= 4.38 & clinically euthyroid, continue same...    GI- GERD, IBS, Polyps> on Prilosec40Qd & Zofran/ Librax prn; [facility] note reviewed (heme pos stool & anemia from epistaxis & NSAIDs); he had colonoscopy  3/12- neg x for hems & DrM rec f/u 35yr...    GU>  Hx BPH w/ BOO on Rapaflo 8479md per DrEskridge; last seen 1/16 & doing satis & pt reports that his PSA was OK...    Ortho> on OTC meds prn; eval 12/11 by DrNorris for bilat leg pain & cramping, ?min atrophy of left gastroc- he rec tonic water & neurology  eval> he saw DrWillis 3/12 (note reviewed)> NCV was normal; EMG c/w mild S1 radiculopathy; he rec MRI- pt decided against the MRI;  He reports some discomfort in knees, hips, back & we decided on trial of Tramadol50...    Anxiety/ Insomnia>  He has Chlorazepate7.5 for Prn use, and Ambien10 to use for sleep Qhs... We reviewed prob list, meds, xrays and labs> see below for updates >>   CXR 4/16 showed norm heart size, clear lungs w/ some hyperinflation, RLL AVM noted w/o change...  EKG 4/16 revealed poor tracing- NSR, rate68, minor NSSTTWA, NAD...   LABS 4/16:  FLP- at goals on diet alone;  Chems- wnl;  CBC- wnl;  TSH=4.38...  ~  June 04, 2015:  79m59moV & post hosp check>  BobMikki Santees hosp 5/24 - 05/21/15 by [facility] after presenting w/ HA & right leg weakness; he tells me he saw his dentist for routine cleaning 2wks prior to this; eval revealed a brain lesion in left basal ganglionic region (tumor vs abscess); he had brain stereotactic bx 5/27 by DrNundkumar (left frontal craniectomy) & thick pus was removed- cultures +peptostreptococcus species (otherw neg aerobic bact cult, AFB, Fungi); he was seen by ID & placed on Rochepin, Vanco, Flagyl to be continued 49mo48mot disch (they are checking levels and adjusting dose); also started on Keppra500Bid per NS;  Since disch he is feeling better- no f/c/s, notes sl dry cough/ no sput, & the cough is keeping him awake- TUSSIONEX called in & helps he says;  Sl nausea relieved by phenergan prn...  EXAM reveals Afeb, VSS, O2sat=94% on RA at rest;  HEENT- OWR telangiectasias w/o change;  Chest- few basilar crackles w/o wheezing, rhonchi, consolidation;  Heart- RR w/o m/r/g;  Abd- neg;  Ext- neg w/o c/c/e...  CT Head 5/16 showed 2 x 3 cm LEFT basal ganglia mass, likely tumor vs abscess, moderately extensive surrounding edema, small surgical clips within or near the posterior wall LEFT maxillary sinus/LEFT pterygopalatine fossa, in this patient with history of nose bleed (these  are stable from prior scans)...  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   EKG 5/16 showed NSR, rate68, wnl, NAD...  Marland KitchenMarland Kitchenn Dopplers 6/16 were neg for DVT  LABS- reviewed> only pos cult= peptostreptococcus from brain bx;  Chems- ok x Na & Ca sl low;  CBC- ok x WBC 14=>11... 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 Given IS, refill Phenergan & Tussionex...Marland KitchenMarland Kitchen  Problem List:   OTHER DISEASES OF NASAL CAVITY AND SINUSES - Hx of Osler-Weber-Rendue syndrome/ hereditary telangiectasias... he's had numerous nose bleeds and surgery by Etter Sjogren, now followed by Barbaraann Faster... left nares is occluded w/ skin graft- no lumen... this is quite uncomfortable to the patient but he's been told there is nothing further that can be done for this... ~  3/11:  notes occas nosebleeds but he is able to handle them... ~  4/12:  No diff in his pattern of mild nose bleeds & he denies any severe epistaxis episodes... 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 ~  4/14:  He notes infreq bleeding now w/ saline etc... 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 Dyspnea w/ O2 desat w/ exercise >>  ~  12/14:  PFTs showed FVC=3.98 (89%), FEV1=2.72 (79%), %1sec=68, and mid-flows= 51% predicted; c/w mild obstructive dis & we decided on a trial of Dulera100-2spBid ~  4/15:  His breathing is improved w/ the [facility] and exercise program... ~  10/15:  His breathing is back to baseline after FeSO4 rx for anemia & ret of Hg to normal; he has stopped the [facility] & has it handy for prn use... ~  4/16:  prev on Dulera100-2spBid, now just prn; breathing improved w/ exercise; O2 sat= 93% on RA today; c/o dry throat & encouraged to use lozenges etc... ~  CXR 4/16 showed norm heart size, clear lungs w/ some hyperinflation, RLL AVM noted w/o change... 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 known GI telangiectasia as well without signif GI bleeding in the past- followed by [facility]... he's also has skin telangiectasis w/ prev laser therapy at [facility]... extensive nasal problems as above... ~  labs 2/09 showed Hg= 17.0 ~  labs 2/10 showed Hg= 16.4 ~  CXR 3/11 is chr incr markings esp RLL- no change, NAD...  ~  Labs 6/11 showed Hg= 15.9 ~  Labs 4/12 showed Hg= 16.0 ~  CXR 10/12 showed mild biapical scarring, RLL AVM, NAD... ~  Labs 4/13  showed Hg= 15.2 ~  CXR 8/13 showed heart at upper lim of norm, increased markings & apical scarring, chr incr markings in RLL- AVM, NAD.[name]. 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 ~  CXR 3/15 showed normal heart size, clear lungs, mild apical scarring,  right basilar density c/w AVM based on CT report 2004, NAD.[name]. ~  CXR 4/16 showed norm heart size, clear lungs w/ some hyperinflation, RLL AVM noted w/o change... 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  Hx of CHEST PAIN (ICD-786.50) - seen Sep09 w/ several recent bouts of CP while working in yard, washing car, etc... resolved w/ rest & Protonix... EKG showed WNL, prev [facility] was neg (no ischemia or infarction and EF= 61%)... Cardiac eval DrCrenshaw w/ 2DEcho 10/09 showing norm LV- no regional wall motion abn, norm LVF w/ EF= 60%, mild dil RA/ RV;  and norm MYOVIEW (no ischemia or infarction, EF= 58%)... ~  10/14: repeat cardiac eval by DrCrenshaw due to [place] w/ stairs; EKG 10/14 showed SBrady, rate57, otherw wnl;  2DEcho 10/14 showed normal LV size & wall thickness, norm LVF w/ EF=55-60%, Gr1DD, mildly thickened AoV leaflets, mild MR;  ETT 11/14 showed 30mn on treadmill, hypertensive BP response, rare PVC, no ischemia, O2 sats dropped to 87% w/ exercise ~  EKG 3/15 showed NSR, rate87, low volt, NAD...   HYPERCHOLESTEROLEMIA, MILD (ICD-272.0) - now on CRESTOR 574md>  Prev Rx w/ Simva40 but developed muscle cramping which resolved off the med. ~  FLP 6/08 showed TChol 208, TG 75, HDL 34, LDL 139... he preferred diet Rx- ~  FLP 2/09 showed TChol 172, TG 69, HDL 30, LDL 129... he agreed to Cr[place]. ~  FL[place]/09 on  Crestor10 showed TChol 91, TG 49, HDL 28, LDL 54... rec- keep same. ~  FLP 2/10 on Crestor10 showed TChol 108, TG 56, HDL 31, LDL 66 ~  9/10:  we discussed changing Crestor10 to Simvastatin40 for $$$ reasons... ~  FL[place]/11 on Simva40 showed TChol 105, TG 59, HDL 39, LDL 54... continue same. ~  FLBowman/12 on Simva40 showed TChol 104, TG 62, HDL 29, LDL 63... C/o severe muscle cramps & wants to stop for 29m56moramping resolved! ~  Referred to [facility] & they restarted his Crestor at 5mg6m tol well so far... ~  FLP 10/12 on Cres5 showed TChol 114, TG 40, HDL 36, LDL 70 ~  FLP 2/13 on Cres5 showed TChol 124, TG 21, HDL 40, LDL 80 ~  FLP 4/14 on Cres5 showed TChol 119, TG 44, HDL 38, LDL 72  ~  6/14: pt stopped Cres5 on his own due to leg cramps & decided to hold statins for now w/ repeat FLP off meds in several months... ~  FLP 10/14 on diet alone showed TChol 155, TG 29, HDL 38, LDL 111  ~  FLP 4/15 on diet alone showed TChol 137, TG 30, HDL 36, LDL 95 ~  FLP 4/16 on diet alone showed TChol 146, TG 85, HDL 35, LDL 94  HYPOTHYROIDISM - on SYNTHROID 112mc68mnow; prev 125mcg60mose was decreased 4/12... ~  labs 2/09 showed TSH= 0.69...  8/09 showed TSH= 0.44... ~  labs 2/10 showed TSH= 0.68 ~  labs 3/11 showed TSH= 0.52 ~  Labs 4/12 on Synthroid125 showed TSH= 0.33... We decided to decr the dose to 112/d. ~  Labs 4/13 on Levo112 showed TSH= 4.60 ~  Labs 4/14 on Levo112 showed TSH= 5.46 ~  Labs 4/15 on Levo112 showed TSH= 9.26 and we decided to incr dose back to 125mcg/68m ~  Labs 4/16 on Levo125 showed TSH= 4.38  GERD, IRRITABLE BOWEL SYNDROME, COLONIC POLYPS - followed by [facility]... on Protonix40 (he tried OTC Zantac 34m on his own)... also takes  [place]..  ~  colonoscopy 1/04 w/ divertics, diminutive rectal polyp (adenomatous), hems...  ~  f/u colon 1/07 showed no recurrent polyps... ~  [facility]  HYPERTROPHY  PROSTATE W/UR OBST & OTH LUTS (ICD-600.01) - followed by DrOFBPZWCHn RAPAFLO 6m92m (stopped Flomax due to side effect- ED)... doing well without LTOS, just complains of nocturia x 1-2 ~  labs 2/09 showed PSA= 0.76 ~  labs 2/10 showed PSA= 0.90 ~  2011 PSA checked by DrDavis (pt states it was OK)... ~  Labs 4/12 showed PSA= 1.22 ~  Labs 4/13 showed PSA= 1.31 ~  10/13: presents c/o incr freq, nocturia x2-4, & weak stream despite Rapaflo; he is intol to Flomax; refer to Urology for further eval=> seen by DrEskridge, no changes made... ~  1/15: f/u by Urology, DrEskridge> BPH, LUTS, hypersens unstable bladder; on Rapaflo8 & doing satis, PSA reported to be 2.34...  LEFT ARM & SHOULDER PAIN >> he was checked by GboLacie Scottst not improving he says; [facility]> notes reviewed.  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 ~  9/10:  requesting Rx for Robaxin for muscle spasm as needed.  PLANTAR FASCIITIS (ICD-728.71) - c/o pain in his feet secondary to plantar fasciitis eval & [facility]/ Celebre[facility] & shots... ~  9/10: we [facility] to [facility].  Hx NOCTURNAL LEG CRAMPS >>  ~  This initially occurred several yrs ago while on Simvastatin & the cramps resolved off this med; he was subseq placed on Cres5 & tol well... ~  6/14: he indicates that noct leg cramps returned x1wk & he stopped the Cres5 w/ improvement; he wants to leave off the statins for now; rec to try tonic water/ yellow mustard prn...  Hx of DIZZINESS (ICD-780.4) - sudden episode dizziness Sep09 working at [employer] room spinning- lasted 5m73mnd resolved spontaneously, felt light headed afterwards... no LOC, syncope, seizure activity, etc... no CP/ palpit/ SOB/ etc... there were several EMTs in the restaurant and they checked him out- stable and transported to ER... exam was neg-  they did  labs: all norm, didn't do scans (can't get MRI due to metallic clamp in sinus), he can't take ASA due to bleeding from his OWR... tMarland Kitcheneated w/ MECLIZINE w/ improvement.  ANXIETY (ICD-300.00) & INSOMNIA, CHRONIC (ICD-307.42) - he uses CHLORAZEPATE 7.5mg 85m & AMBIEN5mg/39mfor chronic persistant insomnia...  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  ANEMIA >>  ~  LABS 4/13 showed Hg= 15.2, MCV=91 ~  LABS 4/14 showed Hg= 14.5, MCV=84 ~  LABS 3/15 showed Hg= 13.6, MCV=78 ~  LABS 4/15 showed Hg= 11.3, MCV= 77, Iron studies showed Fe= 16 (3.6%sat), Ferritin= 6.4, & stool cards neg; Rx w/ FeSO4... ~  Labs 10/15 showed Hg= 16.5, MCV= 91, Fe=88 (23%sat), Ferritin= 36...  ~  Labs 4/16 showed Hg= 16.0  Health Maintenance: ~  [facility]:  [facility] etc... (last 1/07 & 49yr f/79yre 1/12). ~  GU:  followed by DrRDavis in past; PSAs have all been wnl... ~  Immunizations:  he gets yearly Flu vaccinations in the fall;  OK Pneumovax 03/12/10 at age[age];     Past Surgical History  Procedure Laterality Date  . Varicocelectomy  1991    Dr. Tresa Endo  . 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 Other surgical history      pulse laser for facial telangiectasias inthe past  . Excision left wrist ganglian/ myxoid cyst  05-30-2009  . Cardiovascular stress test  01-14-2003    NO ISCHEMIA / EF 61%/ NORMAL LE WALL MOTION  . Transthoracic echocardiogram  10-17-2008   DR CRENSHAW    NORMAL LVF/ EF 60%/  MILDLY DILATED RIGHT ATRIUM/ VENTRICULE  . 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 Renda Rolls  . Brain biopsy Left 05/16/2015    Procedure: Stereotactic Left Brain Biopsy with Brain Lab;  Surgeon: Consuella Lose, MD;  Location: [place] [facility];  Service: Neurosurgery;  Laterality: Left;  Stereotactic Left Brain biopsy with  brainlab  . Application of cranial navigation N/A 05/16/2015    Procedure: APPLICATION OF CRANIAL NAVIGATION;  Surgeon: Consuella Lose, MD;  Location: [place] [place] [facility];  Service: Neurosurgery;  Laterality: N/A;    Outpatient Encounter Prescriptions as of 06/04/2015  Medication Sig  . acetaminophen (TYLENOL) 325 MG tablet Take 650 mg by mouth daily as needed (pain).  . Ascorbic Acid (VITAMIN C) 1000 MG tablet Take 2,000 mg by mouth daily.  . cefTRIAXone (ROCEPHIN) 40 MG/ML IVPB Inject 50 mLs (2 g total) into the vein every 12 (twelve) hours.  . chlorpheniramine-HYDROcodone (TUSSIONEX PENNKINETIC ER) 10-8 MG/5ML SUER Take 5 mLs by mouth every 12 (twelve) hours as needed for cough.  . Cholecalciferol (VITAMIN D3) 2000 UNITS TABS Take 2,000 Units by mouth daily.  . clidinium-chlordiazePOXIDE (LIBRAX) 5-2.5 MG per capsule Take 1 capsule by mouth 3 (three) times daily as needed.  . clorazepate (TRANXENE) 7.5 MG tablet TAKE 1 TABLET BY MOUTH 3 TIMES A DAY AS NEEDED  . Diphenhyd-Hydrocort-Nystatin (FIRST-DUKES MOUTHWASH) SUSP 1 tsp gargle and swallow four times daily  . levETIRAcetam (KEPPRA) 500 MG tablet Take 1 tablet (500 mg total) by mouth 2 (two) times daily.  [name] levothyroxine (SYNTHROID, LEVOTHROID) 125 MCG tablet TAKE 1 TABLET (125 MCG TOTAL) BY MOUTH DAILY BEFORE BREAKFAST.  [name] loratadine (CLARITIN) 10 MG tablet Take 10 mg by mouth daily.  . Magnesium 250 MG TABS Take 250 mg by mouth at bedtime.   . meclizine (ANTIVERT) 25 MG tablet Take 1 tablet (25 mg total) by mouth daily as needed for dizziness.  . metroNIDAZOLE (FLAGYL) 500 MG tablet Take 1 tablet (500 mg total) by mouth every 8 (eight) hours.  . Multiple Vitamin (MULTIVITAMIN WITH MINERALS) TABS tablet Take 1 tablet by mouth daily. Centrum  . pantoprazole (PROTONIX) 40 MG tablet Take 1 tablet (40 mg total) by mouth daily.  [name] RAPAFLO 8 MG CAPS capsule TAKE ONE CAPSULE AT BEDTIME  . vancomycin 1,750 mg in sodium chloride 0.9 % 500 mL Inject  1,750 mg into the vein daily.  . [DISCONTINUED] promethazine (PHENERGAN) 12.5 MG tablet Take 1-2 tablets (12.5-25 mg total) by mouth every 4 (four) hours as needed for refractory nausea / vomiting.  . promethazine (PHENERGAN) 12.5 MG tablet Take 1 tablet (12.5 mg total) by mouth every 4 (four) hours as needed for nausea or vomiting.   No facility-administered encounter medications on file as of 06/04/2015.  Allergies  Allergen Reactions  . Aspirin Other (See Comments)    nosebleeds  . Statins Other (See Comments)    intolerance  . Zocor [Simvastatin - High Dose] Other (See Comments)    Leg cramps     Current Medications, Allergies, Past Medical History, Past Surgical History, Family History, and Social History were reviewed in [employer] record.    Review of Systems       See HPI - other systems neg except as noted... The patient denies anorexia, fever, weight loss, weight gain, vision loss, decreased hearing, hoarseness, chest pain, syncope, dyspnea on exertion, peripheral edema, prolonged cough, headaches, hemoptysis, abdominal pain, melena, hematochezia, severe indigestion/heartburn, hematuria, incontinence, muscle weakness, suspicious skin lesions, transient blindness, difficulty walking, depression, unusual weight change, abnormal bleeding, enlarged lymph nodes, and angioedema.     Objective:   Physical Exam      WD, WN, 74 y/o WM in NAD... 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 GENERAL:  Alert & oriented; pleasant & cooperative... HEENT:  [place]/AT, EOM-wnl, PERRLA, EACs-clear, TMs-wnl, NOSE- occluded left nares from skin grafts, MOUTH- mucosal telangiectasias.  NECK:  Supple w/ fairROM; no JVD; normal carotid impulses w/o bruits; no thyromegaly or nodules palpated; no lymphadenopathy. CHEST:  Clear to P & A; without wheezes/ rales/ or rhonchi. HEART:  Regular Rhythm; without murmurs/ rubs/ or gallops. ABDOMEN:  Soft & nontender; normal bowel  sounds; no organomegaly or masses detected. EXT: without deformities, mild arthritic changes; no varicose veins/ venous insuffic/ or edema. NEURO:  CN's intact; motor testing normal; sensory testing normal; gait normal & balance OK. DERM:  mult telangiectasias...  RADIOLOGY DATA:  Reviewed in the EPIC EMR & discussed w/ the patient...  LABORATORY DATA:  Reviewed in the EPIC EMR & discussed w/ the patient...   Assessment & Plan:    Hx Cough, Dyspnea on exertion, mild COPD >> as above, prev on Dulera100- 2sp Bid & exercise program- improved; we will continue to monitor his status & O2 sats; Dyspnea improved w/ Fe for anemia & ret of Hg to norm... He has Tussionex prn cough...  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     Hx CP>  Prev cardiac w/u by DrCrenshaw was neg, no recurrent CP complaints, but he notes DOE w/ stairs & DrCrenshaw repeated 2DEcho & ETT as above...     Chol>  Off statins due to recurrent cramps, on diet alone & FLP is reasonable...     Hypothy>  On Synthroid 185mg/d now>  He is clinically & biochem euthyroid...     [facility]>  [facility]- neg x for hems & DrM rec f/u 522yr..     GU>  Hx BPH w/ BOO & followed by DrEskridge on Rapaflo 73m16m & stable...     Ortho>  See above- prev stable on OTC analgesics as needed, but recent incr symptoms- try Tramadol50 prn...  LEG CRAMPS >> he is quite sure they are from his statin Rx & we decided to leave these off for now, try to control on diet/ exercise & continued monitoring...Marland KitchenMarland Kitchen  Anxiety/ Insomnia>  He has Chlorazepate for Prn use, and Ambien to  use for sleep Qhs...  Anemia>  On FeSO4 supplement & Hg now back to normal, dyspnea ret to baseline...   Patient's Medications  New Prescriptions   No medications on file  Previous Medications   ACETAMINOPHEN (TYLENOL) 325 MG TABLET    Take 650 mg by mouth daily as needed (pain).   ASCORBIC ACID (VITAMIN C) 1000 MG TABLET    Take 2,000 mg by mouth daily.   CEFTRIAXONE (ROCEPHIN) 40 MG/ML IVPB    Inject 50 mLs (2 g total) into the vein every 12 (twelve) hours.   CHLORPHENIRAMINE-HYDROCODONE (TUSSIONEX PENNKINETIC ER) 10-8 MG/5ML SUER    Take 5 mLs by mouth every 12 (twelve) hours as needed for cough.   CHOLECALCIFEROL (VITAMIN D3) 2000 UNITS TABS    Take 2,000 Units by mouth daily.   CLIDINIUM-CHLORDIAZEPOXIDE (LIBRAX) 5-2.5 MG PER CAPSULE    Take 1 capsule by mouth 3 (three) times daily as needed.   CLORAZEPATE (TRANXENE) 7.5 MG TABLET    TAKE 1 TABLET BY MOUTH 3 TIMES A DAY AS NEEDED   DIPHENHYD-HYDROCORT-NYSTATIN (FIRST-DUKES MOUTHWASH) SUSP    1 tsp gargle and swallow four times daily   LEVETIRACETAM (KEPPRA) 500 MG TABLET    Take 1 tablet (500 mg total) by mouth 2 (two) times daily.   LEVOTHYROXINE (SYNTHROID, LEVOTHROID) 125 MCG TABLET    TAKE 1 TABLET (125 MCG TOTAL) BY MOUTH DAILY BEFORE BREAKFAST.   LORATADINE (CLARITIN) 10 MG TABLET    Take 10 mg by mouth daily.   MAGNESIUM 250 MG TABS    Take 250 mg by mouth at bedtime.    MECLIZINE (ANTIVERT) 25 MG TABLET    Take 1 tablet (25 mg total) by mouth daily as needed for dizziness.   METRONIDAZOLE (FLAGYL) 500 MG TABLET    Take 1 tablet (500 mg total) by mouth every 8 (eight) hours.   MULTIPLE VITAMIN (MULTIVITAMIN WITH MINERALS) TABS TABLET    Take 1 tablet by mouth daily. Centrum   PANTOPRAZOLE (PROTONIX) 40 MG TABLET    Take 1 tablet (40 mg total) by mouth daily.   RAPAFLO 8 MG CAPS CAPSULE    TAKE ONE CAPSULE AT BEDTIME   VANCOMYCIN 1,750 MG IN SODIUM CHLORIDE 0.9 % 500 ML    Inject 1,750 mg into the vein daily.  Modified  Medications   Modified Medication Previous Medication   PROMETHAZINE (PHENERGAN) 12.5 MG TABLET promethazine (PHENERGAN) 12.5 MG tablet      Take 1 tablet (12.5 mg total) by mouth every 4 (four) hours as needed for nausea or vomiting.    TAKE 1 OR 2 TABLETS BY MOUTH EVERY 4 HOURS AS NEEDED FOR REFRACTORY NAUSEA OR VOMITING  Discontinued Medications   No medications on file

## 2015-06-08 ENCOUNTER — Telehealth: Payer: Self-pay | Admitting: Critical Care Medicine

## 2015-06-08 NOTE — Telephone Encounter (Signed)
Pt with double vision, intermittent, wife concerned, RN from [facility] called.  From chart review is likely due to CNS lesion not from Vancomycin Told to continue vancomycin and I would let office know Pt may need another OV this week

## 2015-06-09 ENCOUNTER — Other Ambulatory Visit: Payer: Self-pay | Admitting: Internal Medicine

## 2015-06-09 DIAGNOSIS — R110 Nausea: Secondary | ICD-10-CM

## 2015-06-09 MED ORDER — ONDANSETRON HCL 4 MG PO TABS: 4.0000 mg | ORAL_TABLET | Freq: Three times a day (TID) | ORAL | Status: DC | PRN

## 2015-06-09 MED ORDER — HYDROCODONE-HOMATROPINE 5-1.5 MG/5ML PO SYRP: 5.0000 mL | ORAL_SOLUTION | ORAL | Status: DC | PRN

## 2015-06-09 NOTE — Telephone Encounter (Signed)
  Spoke with pt's wife, pt c/o L sided abdominal pain, nonprod cough no better with the cough syrup. Pt and wife are requesting recs for another cough suppressant that pt can take more than 12 hours. Pt is also having lots of constipation while taking vancomycin, not taking a probiotic currently.   I offered SN's 11:00 appt as PW had suggested, pt and wife declined.    Last ov: 06/04/15  SN please advise.  Thanks!  Allergies  Allergen Reactions  . Aspirin Other (See Comments)    nosebleeds  . Statins Other (See Comments)    intolerance  . Zocor [Simvastatin - High Dose] Other (See Comments)    Leg cramps   Current Outpatient Prescriptions on File Prior to Visit  Medication Sig Dispense Refill  . acetaminophen (TYLENOL) 325 MG tablet Take 650 mg by mouth daily as needed (pain).    . Ascorbic Acid (VITAMIN C) 1000 MG tablet Take 2,000 mg by mouth daily.    . cefTRIAXone (ROCEPHIN) 40 MG/ML IVPB Inject 50 mLs (2 g total) into the vein every 12 (twelve) hours. 3000 mL 0  . chlorpheniramine-HYDROcodone (TUSSIONEX PENNKINETIC ER) 10-8 MG/5ML SUER Take 5 mLs by mouth every 12 (twelve) hours as needed for cough. 140 mL 0  . Cholecalciferol (VITAMIN D3) 2000 UNITS TABS Take 2,000 Units by mouth daily.    . clidinium-chlordiazePOXIDE (LIBRAX) 5-2.5 MG per capsule Take 1 capsule by mouth 3 (three) times daily as needed. 60 capsule 0  . clorazepate (TRANXENE) 7.5 MG tablet TAKE 1 TABLET BY MOUTH 3 TIMES A DAY AS NEEDED 90 tablet 1  . Diphenhyd-Hydrocort-Nystatin (FIRST-DUKES MOUTHWASH) SUSP 1 tsp gargle and swallow four times daily 120 mL 5  . levETIRAcetam (KEPPRA) 500 MG tablet Take 1 tablet (500 mg total) by mouth 2 (two) times daily. 60 tablet 0  . levothyroxine (SYNTHROID, LEVOTHROID) 125 MCG tablet TAKE 1 TABLET (125 MCG TOTAL) BY MOUTH DAILY BEFORE BREAKFAST. 30 tablet 5  . loratadine (CLARITIN) 10 MG tablet Take 10 mg by mouth daily.    . Magnesium 250 MG TABS Take 250 mg by mouth at bedtime.      . meclizine (ANTIVERT) 25 MG tablet Take 1 tablet (25 mg total) by mouth daily as needed for dizziness. 30 tablet 2  . metroNIDAZOLE (FLAGYL) 500 MG tablet Take 1 tablet (500 mg total) by mouth every 8 (eight) hours. 90 tablet 0  . Multiple Vitamin (MULTIVITAMIN WITH MINERALS) TABS tablet Take 1 tablet by mouth daily. Centrum    . pantoprazole (PROTONIX) 40 MG tablet Take 1 tablet (40 mg total) by mouth daily. 30 tablet 6  . promethazine (PHENERGAN) 12.5 MG tablet Take 1 tablet (12.5 mg total) by mouth every 4 (four) hours as needed for nausea or vomiting. 30 tablet 5  . RAPAFLO 8 MG CAPS capsule TAKE ONE CAPSULE AT BEDTIME 30 capsule 5  . vancomycin 1,750 mg in sodium chloride 0.9 % 500 mL Inject 1,750 mg into the vein daily. 30 Dose 0   No current facility-administered medications on file prior to visit.

## 2015-06-09 NOTE — Telephone Encounter (Signed)
Per SN, wife was given the following instructions - Pt to take miralax 1 capful twice daily -Pt to take Senna-S 2 tablets at bedtime -Pt to take fleets enema prn  -Pt to take hycodan 1 tsp q 4 hour prn as needed for cough. Wife was instructed that rx would be placed up front for pick up - Pt was instructed to f/u with Dr Earlean Shawl for constipation problems  Pt's wife voiced understanding. Nothing further is needed.

## 2015-06-09 NOTE — Progress Notes (Signed)
Patient is sleeping more than previously, he does take phenergan to help with nausea. I will change his anti-emetics to zofran to see if that helps. He reports that his thinking, writing, and speech is improving.  Recent labs from 6/19 shows vanco 20.3. Home health to decrease dose per family's report

## 2015-06-13 ENCOUNTER — Encounter: Payer: Self-pay | Admitting: Pulmonary Disease

## 2015-06-13 LAB — FUNGUS CULTURE W SMEAR: Fungal Smear: NONE SEEN

## 2015-06-15 ENCOUNTER — Other Ambulatory Visit: Payer: Self-pay | Admitting: Internal Medicine

## 2015-06-15 ENCOUNTER — Telehealth: Payer: Self-pay | Admitting: Internal Medicine

## 2015-06-15 MED ORDER — FLUCONAZOLE 100 MG PO TABS: 100.0000 mg | ORAL_TABLET | Freq: Every day | ORAL | Status: DC

## 2015-06-15 NOTE — Telephone Encounter (Signed)
[facility] called and patient has thrush.  Sent in fluconazole to his pharmacy.

## 2015-06-19 ENCOUNTER — Ambulatory Visit (INDEPENDENT_AMBULATORY_CARE_PROVIDER_SITE_OTHER): Payer: Medicare Other | Admitting: Internal Medicine

## 2015-06-19 ENCOUNTER — Other Ambulatory Visit: Payer: Self-pay | Admitting: Internal Medicine

## 2015-06-19 DIAGNOSIS — R42 Dizziness and giddiness: Secondary | ICD-10-CM

## 2015-06-19 DIAGNOSIS — G939 Disorder of brain, unspecified: Secondary | ICD-10-CM

## 2015-06-19 DIAGNOSIS — M6289 Other specified disorders of muscle: Secondary | ICD-10-CM

## 2015-06-19 DIAGNOSIS — G060 Intracranial abscess and granuloma: Secondary | ICD-10-CM | POA: Diagnosis not present

## 2015-06-19 DIAGNOSIS — G9389 Other specified disorders of brain: Secondary | ICD-10-CM

## 2015-06-19 DIAGNOSIS — R531 Weakness: Secondary | ICD-10-CM

## 2015-06-19 MED ORDER — METRONIDAZOLE 500 MG PO TABS: 500.0000 mg | ORAL_TABLET | Freq: Three times a day (TID) | ORAL | Status: DC

## 2015-06-19 NOTE — Progress Notes (Signed)
Patient ID: Gregory [name], male   DOB: 03-17-1941, 74 y.o.   MRN: [id number]         [facility]  Patient Active Problem List   Diagnosis Date Noted  . Brain abscess     Priority: High  . Encounter for therapeutic drug monitoring   . Right sided weakness 05/13/2015  . Osteoarthritis 04/01/2015  . Iron deficiency anemia due to chronic blood loss 10/08/2014  . Hypoxemia 02/21/2014  . 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 DOE (dyspnea on exertion) 11/21/2013  . Nocturnal leg cramps 06/11/2013  . Cough, persistent 09/27/2012  . Neck strain 12/10/2011  . Leg pain 03/27/2011  . Obstruction to urinary outflow 03/23/2011  . CERVICAL STRAIN 06/09/2010  . Anxiety state 09/11/2009  . SINUSITIS, ACUTE 08/21/2009  . GANGLION CYST 04/10/2009  . PLANTAR FASCIITIS 01/30/2009  . DIZZINESS 09/17/2008  . CHEST PAIN 09/13/2008  . HYPERCHOLESTEROLEMIA, MILD 07/25/2008  . CARCINOMA, SKIN, SQUAMOUS CELL 02/10/2008  . INSOMNIA, CHRONIC 02/10/2008  . Refractory obstruction of nasal airway 02/10/2008  . BRONCHITIS 02/10/2008  . GERD 02/10/2008  . BPH (benign prostatic hypertrophy) with urinary obstruction 02/10/2008  . 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 COLONIC POLYPS 02/08/2008  . Hypothyroidism 02/08/2008  . Irritable bowel syndrome 02/08/2008  . BACK PAIN, LUMBAR 02/08/2008    Patient's Medications  New Prescriptions   No medications on file  Previous Medications   ACETAMINOPHEN (TYLENOL) 325 MG TABLET    Take 650 mg by mouth daily as needed (pain).   ASCORBIC ACID (VITAMIN C) 1000 MG TABLET    Take 2,000 mg by mouth daily.   CEFTRIAXONE (ROCEPHIN) 40 MG/ML IVPB    Inject 50 mLs (2 g total) into the vein every 12 (twelve) hours.   CHLORPHENIRAMINE-HYDROCODONE (TUSSIONEX PENNKINETIC ER) 10-8 MG/5ML SUER    Take 5 mLs by mouth every 12 (twelve) hours as needed for cough.   CHOLECALCIFEROL (VITAMIN D3) 2000 UNITS TABS     Take 2,000 Units by mouth daily.   CLIDINIUM-CHLORDIAZEPOXIDE (LIBRAX) 5-2.5 MG PER CAPSULE    Take 1 capsule by mouth 3 (three) times daily as needed.   CLORAZEPATE (TRANXENE) 7.5 MG TABLET    TAKE 1 TABLET BY MOUTH 3 TIMES A DAY AS NEEDED   DIPHENHYD-HYDROCORT-NYSTATIN (FIRST-DUKES MOUTHWASH) SUSP    1 tsp gargle and swallow four times daily   FLUCONAZOLE (DIFLUCAN) 100 MG TABLET    Take 1 tablet (100 mg total) by mouth daily.   HYDROCODONE-HOMATROPINE (HYCODAN) 5-1.5 MG/5ML SYRUP    Take 5 mLs by mouth every 4 (four) hours as needed for cough.   LEVETIRACETAM (KEPPRA) 500 MG TABLET    Take 1 tablet (500 mg total) by mouth 2 (two) times daily.   LEVOTHYROXINE (SYNTHROID, LEVOTHROID) 125 MCG TABLET    TAKE 1 TABLET (125 MCG TOTAL) BY MOUTH DAILY BEFORE BREAKFAST.   LORATADINE (CLARITIN) 10 MG TABLET    Take 10 mg by mouth daily.   MAGNESIUM 250 MG TABS    Take 250 mg by mouth at bedtime.    MECLIZINE (ANTIVERT) 25 MG TABLET    Take 1 tablet (25 mg total) by mouth daily as needed for dizziness.   MULTIPLE VITAMIN (MULTIVITAMIN WITH MINERALS) TABS TABLET    Take 1 tablet by mouth daily. Centrum   ONDANSETRON (ZOFRAN) 4 MG TABLET    Take 1 tablet (4 mg total) by mouth every 8 (eight) hours as needed for nausea or vomiting.  PANTOPRAZOLE (PROTONIX) 40 MG TABLET    Take 1 tablet (40 mg total) by mouth daily.   PROMETHAZINE (PHENERGAN) 12.5 MG TABLET    Take 1 tablet (12.5 mg total) by mouth every 4 (four) hours as needed for nausea or vomiting.   RAPAFLO 8 MG CAPS CAPSULE    TAKE ONE CAPSULE AT BEDTIME  Modified Medications   Modified Medication Previous Medication   METRONIDAZOLE (FLAGYL) 500 MG TABLET metroNIDAZOLE (FLAGYL) 500 MG tablet      Take 1 tablet (500 mg total) by mouth every 8 (eight) hours.    Take 1 tablet (500 mg total) by mouth every 8 (eight) hours.  Discontinued Medications   VANCOMYCIN 1,750 MG IN SODIUM CHLORIDE 0.9 % 500 ML    Inject 1,750 mg into the vein daily.     Subjective: [name] is in with his wife for his hospital follow-up visit. In late May he developed sudden onset of difficulty writing and weakness of his right leg. He thought he might be having a stroke and was seen at a local urgent care center. He was referred immediately to the emergency department where he had a CT scan which revealed a ring enhancing mass in the left basal ganglia. He was hospitalized and underwent aspiration of the mass. The aspirate yielded and 7-8 mL of pus. Gram stain showed gram-positive cocci in pairs and chains and gram-positive rods. Cultures grew Peptostreptococcus. He was discharged on IV vancomycin and ceftriaxone and oral metronidazole. He is feeling much better. His right leg weakness resolved promptly after hospitalization and his hand writing with his right hand is significantly improved. He has not had any fever, chills or sweats. He has not had any headache. He has had no problems tolerating his PICC. He did have a transient episode of double vision and leg swelling when his vancomycin dose was increased recently. All of that resolved after the dose was decreased. He's had no other problems tolerating his antibiotics. He has a history of Osler Titus Dubin Rondu syndrome.  Review of Systems: Pertinent items are noted in HPI.  Past Medical History  Diagnosis Date  . Other diseases of nasal cavity and sinuses(478.19)   . Pure hypercholesterolemia   . Unspecified hypothyroidism   . Irritable bowel syndrome   . Benign neoplasm of colon   . Hypertrophy of prostate with urinary obstruction and other lower urinary tract symptoms (LUTS)   . Lumbago   . Plantar fascial fibromatosis   . Anxiety state, unspecified   . Persistent disorder of initiating or maintaining sleep   . GERD (gastroesophageal reflux disease)   . Arthritis   . Telangiectasia, hereditary hemorrhagic, of Rendu, Osler and Weber OLSER'S DISEASE  (OWR)    SKIN, LIPS, NASAL W/ PREVIOUS NOSE BLEEDS   AMD GI TELANGIECTASIA  . History of nonmelanoma skin cancer EXCISION SQUAMOUS CELL FROM HAND  . 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 Smoking status: Never Smoker   . Smokeless tobacco: Never Used  . Alcohol Use: No    Family History  Problem Relation Age of Onset  . Diabetes Mother   . Hypertension Mother   . Pancreatic cancer Mother   . Stroke Mother   . Kidney failure Father   . Hypertension Sister     Allergies  Allergen Reactions  . Aspirin Other (See Comments)    nosebleeds  . Statins Other (See Comments)  intolerance  . Zocor [Simvastatin - High Dose] Other (See Comments)    Leg cramps    Objective:    General: He is alert and in good spirits Skin: Right arm PICC site looks good Lungs: Clear Cor: Regular S1 and S2 with no murmurs Abdomen: Soft and nontender Neuro: No detectable weakness in right arm or leg   Assessment: Mr. Applegate is improving on therapy for polymicrobial, solitary brain abscess. He is now completed 34 days of anabiotic therapy. I do not think that he needs to continue vancomycin but we'll continue IV ceftriaxone and metronidazole for a minimum of 6 weeks total. I will repeat a brain CT scan to help determine optimal duration of treatment.  He was placed on empiric Keppra for seizure prevention. He is questioning if he needs to continue it and if it is okay for him to drive again. I will need to discuss that with his neurosurgeon after his repeat CT scan.  Plan: 1. Discontinue vancomycin 2. Continue IV ceftriaxone and oral metronidazole 3. Repeat head CT with contrast 4. Follow-up in 3 weeks   Michel Bickers, MD [facility] for [place] [phone] pager   [phone] cell 06/19/2015, 5:12 PM

## 2015-06-20 ENCOUNTER — Telehealth: Payer: Self-pay | Admitting: *Deleted

## 2015-06-20 NOTE — Telephone Encounter (Signed)
Patient's wife notified of scheduled CT 7/5 at 8:30 (8:15 arrival). Confirmed appointment time, location. He has Medicare, no prior authorization required.   Patient's wife picked up refill of Keppra, patient will continue to take 500 mg twice daily until advised otherwise by Neurosurgery. Landis Gandy, RN

## 2015-06-24 ENCOUNTER — Ambulatory Visit (HOSPITAL_COMMUNITY)
Admission: RE | Admit: 2015-06-24 | Discharge: 2015-06-24 | Disposition: A | Discharge status: Home / Self Care | Payer: Medicare Other | Source: Ambulatory Visit | Attending: Internal Medicine | Admitting: Internal Medicine

## 2015-06-24 ENCOUNTER — Telehealth: Payer: Self-pay | Admitting: *Deleted

## 2015-06-24 DIAGNOSIS — R51 Headache: Secondary | ICD-10-CM | POA: Diagnosis not present

## 2015-06-24 DIAGNOSIS — Z8661 Personal history of infections of the central nervous system: Secondary | ICD-10-CM | POA: Diagnosis not present

## 2015-06-24 DIAGNOSIS — R930 Abnormal findings on diagnostic imaging of skull and head, not elsewhere classified: Secondary | ICD-10-CM | POA: Diagnosis not present

## 2015-06-24 DIAGNOSIS — I780 Hereditary hemorrhagic telangiectasia: Secondary | ICD-10-CM | POA: Diagnosis not present

## 2015-06-24 DIAGNOSIS — R42 Dizziness and giddiness: Secondary | ICD-10-CM | POA: Insufficient documentation

## 2015-06-24 MED ORDER — IOHEXOL 300 MG/ML  SOLN
75.0000 mL | Freq: Once | INTRAMUSCULAR | Status: AC | PRN
  Administered 2015-06-24: 75 mL via INTRAVENOUS

## 2015-06-24 NOTE — Telephone Encounter (Signed)
Coretta, [facility] pharmacy, calling for end date of IV antibiotics.  Patient's CT was this morning. Please advise. Landis Gandy, RN

## 2015-06-25 ENCOUNTER — Telehealth: Payer: Self-pay | Admitting: *Deleted

## 2015-06-25 ENCOUNTER — Telehealth: Payer: Self-pay | Admitting: Internal Medicine

## 2015-06-25 DIAGNOSIS — G060 Intracranial abscess and granuloma: Secondary | ICD-10-CM

## 2015-06-25 MED ORDER — LEVETIRACETAM 500 MG PO TABS: 500.0000 mg | ORAL_TABLET | Freq: Every day | ORAL | Status: DC

## 2015-06-25 MED ORDER — LEVOFLOXACIN 750 MG PO TABS: 750.0000 mg | ORAL_TABLET | Freq: Every day | ORAL | Status: DC

## 2015-06-25 MED ORDER — METRONIDAZOLE 500 MG PO TABS: 500.0000 mg | ORAL_TABLET | Freq: Three times a day (TID) | ORAL | Status: AC

## 2015-06-25 NOTE — Telephone Encounter (Signed)
Case Manager from [facility] called verify the patient stop date and when to pull the PICC as she received a call from patient wife to come pull the PICC. After review of the patient chart (see Dr Megan Salon note from 06/25/15) advised to D/C IV antibiiotic and pull PICC tomorrow.

## 2015-06-25 NOTE — Telephone Encounter (Signed)
CT HEAD WITHOUT AND WITH CONTRAST 06/24/2015  IMPRESSION: Significant interval improvement when compared to the 05/13/2015 examination.  The ring-enhancing lesion which was centered in the left basal ganglia has decreased significantly in size. Currently, 2.3 x 0.9 cm region of relatively solid enhancement superior aspect of the left basal ganglia remains as versus prior 3.1 x 1.9 cm ring-enhancing lesion.  Significant decrease in surrounding vasogenic edema and marked decrease mass effect upon the left lateral ventricle.  What remains may represent residua of treated abscess. This will need to be followup until complete clearance. As this clears it would be helpful to exclude the possibility of underlying vascular abnormality in this patient with Osler-Weber-Rendu.   Electronically Signed  By: Genia Del M.D.  On: 06/24/2015 09:23  I spoke with Dr. Kathyrn Sheriff by phone this morning and let him know that Mr. Lopezgarcia has had significant clinical and radiographic improvement in his brain abscess. He recommended to have him wean off of his Keppra over the next 3 days by decreasing the dose from twice daily to once daily. He also agreed that it is reasonable for Mr. Faraone to resume driving again. Mr. Castille has now completed 40 days of antibiotic therapy. I reviewed treatment options with him and will change IV ceftriaxone to oral levofloxacin along with oral metronidazole. I will continue antibiotic therapy for 3 more weeks and have his PICC removed. He will follow up with me on 07/21/2015.  Michel Bickers, MD [facility] for [facility] [place] [facility] [phone] pager   [phone] cell 06/25/2015, 10:20 AM

## 2015-06-29 LAB — AFB CULTURE WITH SMEAR (NOT AT ARMC): ACID FAST SMEAR: NONE SEEN

## 2015-06-30 ENCOUNTER — Telehealth: Payer: Self-pay | Admitting: Pulmonary Disease

## 2015-06-30 ENCOUNTER — Telehealth: Payer: Self-pay | Admitting: *Deleted

## 2015-06-30 NOTE — Telephone Encounter (Signed)
Wife reports elevated BP/Pulse since stopping IV ABX, starting PO ABX last Thursday.  Pt has had 12 lb wt loss since starting both PO medications, Flagy and Levofloxicin.  Pt also reports hand tremors, especially with the left hand.  Wife wanted Dr. Megan Salon to know these symptoms.  RN advised wife/patient to report these symptoms to his PCP as well.  RN will notifiy Dr. Megan Salon of these new symptoms.

## 2015-06-30 NOTE — Telephone Encounter (Signed)
Called and spoke to pt's wife, [name]. 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 Pt is on Levaquin 750mg  QD and Flagyl 500 mg TID and started these medications late last week. Pt since has very slight hand tremors, decrease in appetite, tachycardia, and intermittent hypertension, and slight dizziness when standing. Ann stated pt's HR was 108 resting and BP was 140's/80's. Pt denies any SOB, pain or disorientation. Pt contacted ID today (7.11.16) to inform Dr. Megan Salon of pt's s/s and was also instructed by the nurse to call and inform SN.   Will send to SN as FYI.   Allergies  Allergen Reactions  . Aspirin Other (See Comments)    nosebleeds  . Statins Other (See Comments)    intolerance  . Zocor [Simvastatin - High Dose] Other (See Comments)    Leg cramps    Current Outpatient Prescriptions on File Prior to Visit  Medication Sig Dispense Refill  . acetaminophen (TYLENOL) 325 MG tablet Take 650 mg by mouth daily as needed (pain).    . Ascorbic Acid (VITAMIN C) 1000 MG tablet Take 2,000 mg by mouth daily.    . chlorpheniramine-HYDROcodone (TUSSIONEX PENNKINETIC ER) 10-8 MG/5ML SUER Take 5 mLs by mouth every 12 (twelve) hours as needed for cough. 140 mL 0  . Cholecalciferol (VITAMIN D3) 2000 UNITS TABS Take 2,000 Units by mouth daily.    . clidinium-chlordiazePOXIDE (LIBRAX) 5-2.5 MG per capsule Take 1 capsule by mouth 3 (three) times daily as needed. 60 capsule 0  . clorazepate (TRANXENE) 7.5 MG tablet TAKE 1 TABLET BY MOUTH 3 TIMES A DAY AS NEEDED 90 tablet 1  . Diphenhyd-Hydrocort-Nystatin (FIRST-DUKES MOUTHWASH) SUSP 1 tsp gargle and swallow four times daily 120 mL 5  . fluconazole (DIFLUCAN) 100 MG tablet Take 1 tablet (100 mg total) by mouth daily. 10 tablet 0  . HYDROcodone-homatropine (HYCODAN) 5-1.5 MG/5ML syrup Take 5 mLs by mouth every 4 (four) hours as needed for cough. 240 mL 0  . levETIRAcetam (KEPPRA) 500 MG tablet Take 1 tablet (500  mg total) by mouth daily. 60 tablet 0  . levofloxacin (LEVAQUIN) 750 MG tablet Take 1 tablet (750 mg total) by mouth daily. 21 tablet 0  . levothyroxine (SYNTHROID, LEVOTHROID) 125 MCG tablet TAKE 1 TABLET (125 MCG TOTAL) BY MOUTH DAILY BEFORE BREAKFAST. 30 tablet 5  . loratadine (CLARITIN) 10 MG tablet Take 10 mg by mouth daily.    . Magnesium 250 MG TABS Take 250 mg by mouth at bedtime.     . meclizine (ANTIVERT) 25 MG tablet Take 1 tablet (25 mg total) by mouth daily as needed for dizziness. 30 tablet 2  . metroNIDAZOLE (FLAGYL) 500 MG tablet Take 1 tablet (500 mg total) by mouth every 8 (eight) hours. 120 tablet 0  . Multiple Vitamin (MULTIVITAMIN WITH MINERALS) TABS tablet Take 1 tablet by mouth daily. Centrum    . ondansetron (ZOFRAN) 4 MG tablet Take 1 tablet (4 mg total) by mouth every 8 (eight) hours as needed for nausea or vomiting. 30 tablet 2  . pantoprazole (PROTONIX) 40 MG tablet Take 1 tablet (40 mg total) by mouth daily. 30 tablet 6  . promethazine (PHENERGAN) 12.5 MG tablet Take 1 tablet (12.5 mg total) by mouth every 4 (four) hours as needed for nausea or vomiting. 30 tablet 5  . RAPAFLO 8 MG CAPS capsule TAKE ONE CAPSULE AT BEDTIME 30 capsule 5   No current facility-administered medications  on file prior to visit.

## 2015-06-30 NOTE — Telephone Encounter (Signed)
Per SN,  Call in metoprolol 25mg  #60 with instructions to take BID. Inform pt that medication will help with elevated BP and tachycardia.   Pt called and informed of SN rec. Pt's wife, [name], states that her husband's BP and pulse have decreased this morning and would rather just watch both of them for the next couple of days before they add another medication. Pt's wife stated that if either one of them become elevated she will call the office to inform.   Nothing further is needed.

## 2015-07-01 ENCOUNTER — Telehealth: Payer: Self-pay | Admitting: Pulmonary Disease

## 2015-07-01 NOTE — Telephone Encounter (Signed)
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 Pt needs to call and make urgent appt.

## 2015-07-01 NOTE — Telephone Encounter (Signed)
I called made spouse aware of recs. She verbalized understanding. Nothing further needed

## 2015-07-01 NOTE — Telephone Encounter (Signed)
Spoke with spouse. She reports this AM pt woke up and went to the bathroom and when he got up from using the restroom he had a "shade" over right eye and could not see anything out of that eye for a few minutes. Reports BP/pulse was fine this morning. They are wanting to know if it could be from his brain abcess causing this or his BP? No openings w/ SN today for OV. Please advise SN thanks  Allergies  Allergen Reactions  . Aspirin Other (See Comments)    nosebleeds  . Statins Other (See Comments)    intolerance  . Zocor [Simvastatin - High Dose] Other (See Comments)    Leg cramps     Current Outpatient Prescriptions on File Prior to Visit  Medication Sig Dispense Refill  . acetaminophen (TYLENOL) 325 MG tablet Take 650 mg by mouth daily as needed (pain).    . Ascorbic Acid (VITAMIN C) 1000 MG tablet Take 2,000 mg by mouth daily.    . chlorpheniramine-HYDROcodone (TUSSIONEX PENNKINETIC ER) 10-8 MG/5ML SUER Take 5 mLs by mouth every 12 (twelve) hours as needed for cough. 140 mL 0  . Cholecalciferol (VITAMIN D3) 2000 UNITS TABS Take 2,000 Units by mouth daily.    . clidinium-chlordiazePOXIDE (LIBRAX) 5-2.5 MG per capsule Take 1 capsule by mouth 3 (three) times daily as needed. 60 capsule 0  . clorazepate (TRANXENE) 7.5 MG tablet TAKE 1 TABLET BY MOUTH 3 TIMES A DAY AS NEEDED 90 tablet 1  . Diphenhyd-Hydrocort-Nystatin (FIRST-DUKES MOUTHWASH) SUSP 1 tsp gargle and swallow four times daily 120 mL 5  . fluconazole (DIFLUCAN) 100 MG tablet Take 1 tablet (100 mg total) by mouth daily. 10 tablet 0  . HYDROcodone-homatropine (HYCODAN) 5-1.5 MG/5ML syrup Take 5 mLs by mouth every 4 (four) hours as needed for cough. 240 mL 0  . levETIRAcetam (KEPPRA) 500 MG tablet Take 1 tablet (500 mg total) by mouth daily. 60 tablet 0  . levofloxacin (LEVAQUIN) 750 MG tablet Take 1 tablet (750 mg total) by mouth daily. 21 tablet 0  . levothyroxine (SYNTHROID, LEVOTHROID) 125 MCG tablet TAKE 1 TABLET (125 MCG TOTAL)  BY MOUTH DAILY BEFORE BREAKFAST. 30 tablet 5  . loratadine (CLARITIN) 10 MG tablet Take 10 mg by mouth daily.    . Magnesium 250 MG TABS Take 250 mg by mouth at bedtime.     . meclizine (ANTIVERT) 25 MG tablet Take 1 tablet (25 mg total) by mouth daily as needed for dizziness. 30 tablet 2  . metroNIDAZOLE (FLAGYL) 500 MG tablet Take 1 tablet (500 mg total) by mouth every 8 (eight) hours. 120 tablet 0  . Multiple Vitamin (MULTIVITAMIN WITH MINERALS) TABS tablet Take 1 tablet by mouth daily. Centrum    . ondansetron (ZOFRAN) 4 MG tablet Take 1 tablet (4 mg total) by mouth every 8 (eight) hours as needed for nausea or vomiting. 30 tablet 2  . pantoprazole (PROTONIX) 40 MG tablet Take 1 tablet (40 mg total) by mouth daily. 30 tablet 6  . promethazine (PHENERGAN) 12.5 MG tablet Take 1 tablet (12.5 mg total) by mouth every 4 (four) hours as needed for nausea or vomiting. 30 tablet 5  . RAPAFLO 8 MG CAPS capsule TAKE ONE CAPSULE AT BEDTIME 30 capsule 5   No current facility-administered medications on file prior to visit.

## 2015-07-02 ENCOUNTER — Emergency Department (HOSPITAL_COMMUNITY)
Admission: EM | Admit: 2015-07-02 | Discharge: 2015-07-03 | Disposition: A | Discharge status: Home / Self Care | Payer: Medicare Other | Attending: Emergency Medicine | Admitting: Emergency Medicine

## 2015-07-02 ENCOUNTER — Emergency Department (HOSPITAL_COMMUNITY): Payer: Medicare Other

## 2015-07-02 ENCOUNTER — Encounter (HOSPITAL_COMMUNITY): Payer: Self-pay | Admitting: Emergency Medicine

## 2015-07-02 DIAGNOSIS — Z79899 Other long term (current) drug therapy: Secondary | ICD-10-CM | POA: Insufficient documentation

## 2015-07-02 DIAGNOSIS — E039 Hypothyroidism, unspecified: Secondary | ICD-10-CM | POA: Insufficient documentation

## 2015-07-02 DIAGNOSIS — Z8739 Personal history of other diseases of the musculoskeletal system and connective tissue: Secondary | ICD-10-CM | POA: Insufficient documentation

## 2015-07-02 DIAGNOSIS — F419 Anxiety disorder, unspecified: Secondary | ICD-10-CM | POA: Insufficient documentation

## 2015-07-02 DIAGNOSIS — K219 Gastro-esophageal reflux disease without esophagitis: Secondary | ICD-10-CM | POA: Insufficient documentation

## 2015-07-02 DIAGNOSIS — R000 Tachycardia, unspecified: Secondary | ICD-10-CM | POA: Diagnosis present

## 2015-07-02 DIAGNOSIS — Z85828 Personal history of other malignant neoplasm of skin: Secondary | ICD-10-CM | POA: Insufficient documentation

## 2015-07-02 DIAGNOSIS — Z86018 Personal history of other benign neoplasm: Secondary | ICD-10-CM | POA: Diagnosis not present

## 2015-07-02 DIAGNOSIS — R002 Palpitations: Secondary | ICD-10-CM | POA: Insufficient documentation

## 2015-07-02 DIAGNOSIS — K589 Irritable bowel syndrome without diarrhea: Secondary | ICD-10-CM | POA: Diagnosis not present

## 2015-07-02 DIAGNOSIS — N401 Enlarged prostate with lower urinary tract symptoms: Secondary | ICD-10-CM | POA: Insufficient documentation

## 2015-07-02 LAB — BASIC METABOLIC PANEL
Anion gap: 8 (ref 5–15)
BUN: 9 mg/dL (ref 6–20)
CO2: 26 mmol/L (ref 22–32)
Calcium: 8.8 mg/dL — ABNORMAL LOW (ref 8.9–10.3)
Chloride: 105 mmol/L (ref 101–111)
Creatinine, Ser: 1.21 mg/dL (ref 0.61–1.24)
GFR calc Af Amer: >60 mL/min (ref >60)
GFR calc non Af Amer: 58 mL/min — ABNORMAL LOW (ref >60)
Glucose, Bld: 121 mg/dL — ABNORMAL HIGH (ref 65–99)
POTASSIUM: 3.5 mmol/L (ref 3.5–5.1)
SODIUM: 139 mmol/L (ref 135–145)

## 2015-07-02 LAB — CBC WITH DIFFERENTIAL/PLATELET
Basophils Absolute: 0 10*3/uL (ref 0.0–0.1)
Basophils Relative: 0 % (ref 0–1)
EOS ABS: 0.1 10*3/uL (ref 0.0–0.7)
Eosinophils Relative: 1 % (ref 0–5)
HCT: 44.4 % (ref 39.0–52.0)
Hemoglobin: 15.3 g/dL (ref 13.0–17.0)
Lymphocytes Relative: 24 % (ref 12–46)
Lymphs Abs: 2.5 10*3/uL (ref 0.7–4.0)
MCH: 29.5 pg (ref 26.0–34.0)
MCHC: 34.5 g/dL (ref 30.0–36.0)
MCV: 85.7 fL (ref 78.0–100.0)
Monocytes Absolute: 1.4 10*3/uL — ABNORMAL HIGH (ref 0.1–1.0)
Monocytes Relative: 13 % — ABNORMAL HIGH (ref 3–12)
NEUTROS ABS: 6.3 10*3/uL (ref 1.7–7.7)
NEUTROS PCT: 62 % (ref 43–77)
Platelets: 281 10*3/uL (ref 150–400)
RBC: 5.18 MIL/uL (ref 4.22–5.81)
RDW: 14.4 % (ref 11.5–15.5)
WBC: 10.4 10*3/uL (ref 4.0–10.5)

## 2015-07-02 LAB — I-STAT TROPONIN, ED: Troponin i, poc: 0.02 ng/mL (ref 0.00–0.08)

## 2015-07-02 MED ORDER — SODIUM CHLORIDE 0.9 % IV BOLUS (SEPSIS)
500.0000 mL | Freq: Once | INTRAVENOUS | Status: AC
  Administered 2015-07-02: 500 mL via INTRAVENOUS

## 2015-07-02 MED ORDER — SODIUM CHLORIDE 0.9 % IV BOLUS (SEPSIS)
500.0000 mL | Freq: Once | INTRAVENOUS | Status: DC
Start: 2015-07-02 — End: 2015-07-03

## 2015-07-02 NOTE — ED Notes (Signed)
Pt in EMS from home. 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 Pt has been on abx ever since (PICC initially now PO flagyl and levaquin) Has had headaches ever since surgery. 6pm noticed heart was racing, checked pulse and and HR in 160-180's. Pt took 1000mg  tylenol for headache. Once on EMS truck HR in 120-140's was in afib and converted to [place]. Cardiologist Jene Every suggested pt come to hospital and NO ANTICOAGS since recent surgery. Pt does have hx hypoxia. Denies SOB, CP

## 2015-07-02 NOTE — Telephone Encounter (Signed)
I spoke to Mr. and Mrs. Juncaj today. Since starting on oral levofloxacin and metronidazole Mr. Rood has noted some intermittent headache that improves with acetaminophen. He's also had some bilateral hand shaking, slightly increased pulse rate in the range of 85-95 beats per minute, slightly elevated blood pressure around 135/88, anorexia and about 12 pounds weight loss. Yesterday he had some transient visual disturbance noting some shadows in his visual field. He saw his ophthalmologist who did not find any abnormalities. That resolved spontaneously. He is feeling better today with no headache, no visual problems, decreased hand shaking and improved appetite. I told him that it is certainly possible that these may be side effects of his antibiotics. He stated that he feels like he can tolerate them and wants to continue to take them for now. I asked him to call me if his symptoms were worsening.

## 2015-07-02 NOTE — ED Provider Notes (Signed)
CSN: [id number]     Arrival date & time 07/02/15  2138 History   First MD Initiated Contact with Patient 07/02/15 2139     Chief Complaint  Patient presents with  . Tachycardia     (Consider location/radiation/quality/duration/timing/severity/associated sxs/prior Treatment) The history is provided by the patient and medical records.   This is a 74 year old male with past medical history significant for hyperlipidemia, hypothyroidism, IBS, anxiety, GERD, Osler's disease, presenting to the ED for tachycardia.  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 Kathyrn Sheriff.  Patient states surgery went well without noted complications. He previously had and a PICC line for antibiotics, now on oral Flagyl and Levaquin. He states he was released from neurosurgeon approximately 3 weeks ago, now seeing infectious disease for ongoing antibiotic therapy. States called ID physician, Dr. Megan Salon, yesterday regarding tachycardia and elevated blood pressure that has been ongoing for the past few weeks. This was felt that it was possibly due to his antibiotics (levaquin was added a week ago), recommended close monitoring. Patient states around 1800 today he said down to eat dinner and felt that his heart was racing. He checked his pulse with home pulse ox and heart rate was 160-180 range.  He felt lightheaded at this time but no chest pain, SOB, diaphoresis, nausea, or vomiting.  With EMS it was reported that patient was in AFIB and converted back to NSR.  Patient states his cardiologist, Dr. Stanford Breed, advised him to come to the ED for evaluation.  Patient asymptomatic on arrival, slight tachycardia still noted.  Past Medical History  Diagnosis Date  . Other diseases of nasal cavity and sinuses(478.19)   . Pure hypercholesterolemia   . Unspecified hypothyroidism   . Irritable bowel syndrome   . Benign neoplasm of colon   . Hypertrophy of prostate with urinary obstruction and other lower urinary  tract symptoms (LUTS)   . Lumbago   . Plantar fascial fibromatosis   . Anxiety state, unspecified   . Persistent disorder of initiating or maintaining sleep   . GERD (gastroesophageal reflux disease)   . Arthritis   . 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 History of nonmelanoma skin cancer EXCISION SQUAMOUS CELL FROM HAND  . 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 Varicocelectomy  1991    Dr. Tresa Endo  . 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 Other surgical history      pulse laser for facial telangiectasias inthe past  . Excision left wrist ganglian/ myxoid cyst  05-30-2009  . Cardiovascular stress test  01-14-2003    NO ISCHEMIA / EF 61%/ NORMAL LE WALL MOTION  . Transthoracic echocardiogram  10-17-2008   DR CRENSHAW    NORMAL LVF/ EF 60%/  MILDLY DILATED RIGHT ATRIUM/ VENTRICULE  . 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 Renda Rolls  . Brain biopsy Left 05/16/2015    Procedure: Stereotactic Left Brain Biopsy with Brain Lab;  Surgeon: Consuella Lose, MD;  Location: [place] [facility];  Service: Neurosurgery;  Laterality: Left;  Stereotactic Left Brain biopsy with brainlab  . Application of cranial navigation N/A 05/16/2015    Procedure: APPLICATION OF CRANIAL NAVIGATION;  Surgeon: Consuella Lose, MD;  Location: [place] [facility];  Service: Neurosurgery;  Laterality: N/A;   Family History  Problem Relation Age of Onset  . Diabetes Mother   . Hypertension Mother   . Pancreatic cancer Mother   . Stroke Mother   . Kidney failure Father   . Hypertension Sister    History  Substance Use Topics  . Smoking status: Never Smoker   . Smokeless tobacco: Never Used  . Alcohol Use: No    Review of Systems   Cardiovascular: Positive for palpitations.  All other systems reviewed and are negative.     Allergies  Nsaids; Other; Aspirin; and Statins  Home Medications   Prior to Admission medications   Medication Sig Start Date End Date Taking? Authorizing Provider  acetaminophen (TYLENOL) 325 MG tablet Take 650 mg by mouth daily as needed (pain).   Yes Historical Provider, MD  Ascorbic Acid (VITAMIN C) 1000 MG tablet Take 1,000 mg by mouth daily.    Yes Historical Provider, MD  Cholecalciferol (VITAMIN D3) 2000 UNITS TABS Take 2,000 Units by mouth daily.   Yes Historical Provider, MD  levofloxacin (LEVAQUIN) 750 MG tablet Take 1 tablet (750 mg total) by mouth daily. 06/25/15  Yes Michel Bickers, MD  levothyroxine (SYNTHROID, LEVOTHROID) 125 MCG tablet TAKE 1 TABLET (125 MCG TOTAL) BY MOUTH DAILY BEFORE BREAKFAST. 04/01/15  Yes Noralee Space, MD  metroNIDAZOLE (FLAGYL) 500 MG tablet Take 1 tablet (500 mg total) by mouth every 8 (eight) hours. 06/25/15 07/16/15 Yes Michel Bickers, MD  pantoprazole (PROTONIX) 40 MG tablet Take 1 tablet (40 mg total) by mouth daily. 04/01/15  Yes Noralee Space, MD  RAPAFLO 8 MG CAPS capsule TAKE ONE CAPSULE AT BEDTIME 10/08/14  Yes Noralee Space, MD  chlorpheniramine-HYDROcodone [facility] ER) 10-8 MG/5ML SUER Take 5 mLs by mouth every 12 (twelve) hours as needed for cough. 05/28/15   Noralee Space, MD  clidinium-chlordiazePOXIDE (LIBRAX) 5-2.5 MG per capsule Take 1 capsule by mouth 3 (three) times daily as needed. 04/01/15   Noralee Space, MD  clorazepate (TRANXENE) 7.5 MG tablet TAKE 1 TABLET BY MOUTH 3 TIMES A DAY AS NEEDED 07/25/14   Noralee Space, MD  Diphenhyd-Hydrocort-Nystatin (FIRST-DUKES MOUTHWASH) SUSP 1 tsp gargle and swallow four times daily 10/08/14   Noralee Space, MD  fluconazole (DIFLUCAN) 100 MG tablet Take 1 tablet (100 mg total) by mouth daily. 06/15/15   Thayer Headings, MD  HYDROcodone-homatropine [facility]) 5-1.5 MG/5ML syrup Take 5 mLs by mouth  every 4 (four) hours as needed for cough. 06/09/15   Noralee Space, MD  levETIRAcetam (KEPPRA) 500 MG tablet Take 1 tablet (500 mg total) by mouth daily. 06/25/15 06/27/15  Michel Bickers, MD  loratadine (CLARITIN) 10 MG tablet Take 10 mg by mouth daily.    Historical Provider, MD  Magnesium 250 MG TABS Take 250 mg by mouth at bedtime.     Historical Provider, MD  meclizine (ANTIVERT) 25 MG tablet Take 1 tablet (25 mg total) by mouth daily as needed for dizziness. 04/01/15   Noralee Space, MD  Multiple Vitamin (MULTIVITAMIN WITH MINERALS) TABS tablet Take 1 tablet by mouth daily. Centrum    Historical Provider, MD  ondansetron (ZOFRAN) 4 MG tablet Take 1 tablet (4 mg total) by mouth every 8 (eight) hours as needed for nausea or vomiting. 06/09/15   Carlyle Basques, MD  promethazine (PHENERGAN) 12.5 MG tablet Take 1 tablet (12.5 mg total) by mouth every 4 (four) hours as needed  for nausea or vomiting. 06/04/15   Noralee Space, MD   BP 135/75 mmHg  Pulse 105  Temp(Src) 97.7 F (36.5 C) (Oral)  Resp 19  SpO2 93%   Physical Exam  Constitutional: He is oriented to person, place, and time. He appears well-developed and well-nourished.  HENT:  Head: Normocephalic and atraumatic.  Mouth/Throat: Oropharynx is clear and moist.  Eyes: Conjunctivae and EOM are normal. Pupils are equal, round, and reactive to light.  Neck: Normal range of motion.  Cardiovascular: Regular rhythm, normal heart sounds, intact distal pulses and normal pulses.  Tachycardia present.   Pulmonary/Chest: Effort normal and breath sounds normal.  Abdominal: Soft. Bowel sounds are normal.  Musculoskeletal: Normal range of motion.  Neurological: He is alert and oriented to person, place, and time.  AAOx3, answering questions appropriately; equal strength UE and LE bilaterally; CN grossly intact; moves all extremities appropriately without ataxia; no focal neuro deficits or facial asymmetry appreciated  Skin: Skin is warm and dry.   Psychiatric: He has a normal mood and affect.  Nursing note and vitals reviewed.   ED Course  Procedures (including critical care time) Labs Review Labs Reviewed  CBC WITH DIFFERENTIAL/PLATELET - Abnormal; Notable for the following:    Monocytes Relative 13 (*)    Monocytes Absolute 1.4 (*)    All other components within normal limits  BASIC METABOLIC PANEL - Abnormal; Notable for the following:    Glucose, Bld 121 (*)    Calcium 8.8 (*)    GFR calc non Af Amer 58 (*)    All other components within normal limits  Randolm Idol, ED    Imaging Review Dg Chest Port 1 View  07/02/2015   CLINICAL DATA:  74 year old male with tachycardia  EXAM: PORTABLE CHEST - 1 VIEW  COMPARISON:  Chest radiograph dated 04/01/2015  FINDINGS: Single-view of the chest demonstrates emphysematous changes. There is no focal consolidation, pleural effusion, or pneumothorax. The cardiac silhouette is within normal limits. The osseous structures are grossly unremarkable.  IMPRESSION: No active disease.   Electronically Signed   By: Anner Crete M.D.   On: 07/02/2015 22:27     EKG Interpretation   Date/Time:  Wednesday July 02 2015 21:41:27 EDT Ventricular Rate:  108 PR Interval:  147 QRS Duration: 92 QT Interval:  390 QTC Calculation: 523 R Axis:   32 Text Interpretation:  Sinus tachycardia Low voltage, extremity and  precordial leads Prolonged QT interval Confirmed by ZAMMIT  MD, JOSEPH  [phone]) on 07/02/2015 10:00:51 PM      MDM   Final diagnoses:  Tachycardia   74 year old male here with tachycardia. EMS reports A. fib while in route, however on review of rhythm strips, this appears to be a sinus tachycardia with normal/regular p-waves before each QRS, rate 153.  EKG on arrival here with rate now 108.  Patient asymptomatic on arrival-- no sensation of palpitations, chest pain, SOB, lightheadedness, or dizziness.  Lab work was obtained which is reassuring, trop negative.  CXR is clear.   Patient was given IVF, HR now WNL in 90's.  Patient continues to feel well-- no chest pain or SOB.  There is no documented capture of AFIB, remained NSR here.  Tachycardia may be due to his abx-- Levaquin was added to his regimen a week ago which could definitely be responsible for his symptoms.  Will have patient continue abx and follow-up with his cardiologist, Dr. Stanford Breed, as an outpatient.  Patient and wife comfortable with this plan,  will call tomm to schedule appt.  Return precautions given for new or worsening symptoms.  Case discussed with attending physician, Dr. Roderic Palau, who evaluated patient and agrees with assessment and plan of care.  Larene Pickett, PA-C 07/03/15 [id number]  Milton Ferguson, MD 07/03/15 [phone]

## 2015-07-03 ENCOUNTER — Telehealth: Payer: Self-pay

## 2015-07-03 ENCOUNTER — Encounter: Payer: Self-pay | Admitting: Physician Assistant

## 2015-07-03 ENCOUNTER — Ambulatory Visit (INDEPENDENT_AMBULATORY_CARE_PROVIDER_SITE_OTHER): Payer: Medicare Other

## 2015-07-03 ENCOUNTER — Telehealth: Payer: Self-pay | Admitting: *Deleted

## 2015-07-03 ENCOUNTER — Ambulatory Visit (INDEPENDENT_AMBULATORY_CARE_PROVIDER_SITE_OTHER): Payer: Medicare Other | Admitting: Physician Assistant

## 2015-07-03 VITALS — BP 104/70 | HR 84 | Ht 70.0 in | Wt 172.2 lb

## 2015-07-03 DIAGNOSIS — I483 Typical atrial flutter: Secondary | ICD-10-CM

## 2015-07-03 DIAGNOSIS — E039 Hypothyroidism, unspecified: Secondary | ICD-10-CM | POA: Diagnosis not present

## 2015-07-03 DIAGNOSIS — I780 Hereditary hemorrhagic telangiectasia: Secondary | ICD-10-CM | POA: Diagnosis not present

## 2015-07-03 DIAGNOSIS — R000 Tachycardia, unspecified: Secondary | ICD-10-CM | POA: Diagnosis not present

## 2015-07-03 DIAGNOSIS — G060 Intracranial abscess and granuloma: Secondary | ICD-10-CM

## 2015-07-03 LAB — TSH: TSH: 3.28 u[IU]/mL (ref 0.35–4.50)

## 2015-07-03 MED ORDER — METOPROLOL SUCCINATE ER 25 MG PO TB24: 25.0000 mg | ORAL_TABLET | Freq: Every day | ORAL | Status: DC

## 2015-07-03 NOTE — Telephone Encounter (Signed)
Yes

## 2015-07-03 NOTE — Discharge Instructions (Signed)
Continue taking prescribed antibiotics as directed. Follow-up with Dr. Stanford Breed-- call to schedule appt. Return to the ED for new or worsening symptoms-- chest pain, shortness of breath, recurrent palpitations, dizziness, weakness, etc.

## 2015-07-03 NOTE — Telephone Encounter (Signed)
Patient wife called to advise that the patient was seen in the ED yesterday 07/02/15 for irregular heartbeat and also by his cardiologist today. She advised he was given Toprol XL by the cardiologist and wants to see what Dr Megan Salon thinks as he is on 2 antibiotics and they are working and she wants to make sure they do not interact. Advised her will let the doctor know and give her a call back once he reviews the information. Advised her due to the time of day it may be tomorrow morning before I call her back but that I will call her back.

## 2015-07-03 NOTE — Patient Instructions (Signed)
Medication Instructions:  START Metoprolol 12.mg daily for 3-4 days. Measure your blood pressure, if your BP is ok INCREASE to 25mg  daily. An Rx has been sent to your pharmacy  Labwork: Tsh today  Testing/Procedures: Your physician has recommended that you wear an event monitor. Event monitors are medical devices that record the heart's electrical activity. Doctors most often [place] these monitors to diagnose arrhythmias. Arrhythmias are problems with the speed or rhythm of the heartbeat. The monitor is a small, portable device. You can wear one while you do your normal daily activities. This is usually used to diagnose what is causing palpitations/syncope (passing out).   Follow-Up: Your physician recommends that you schedule a follow-up appointment in: 3-4 weeks with Dr.Crenshaw   Any Other Special Instructions Will Be Listed Below (If Applicable).

## 2015-07-03 NOTE — Telephone Encounter (Signed)
Per Dr.Crenshaw and Margaret Pyle pt is to be placed on the flex schedule today. Pt aware of appt today @ 11am with S.Weaver,PA

## 2015-07-03 NOTE — Progress Notes (Signed)
Cardiology Office Note   Date:  07/03/2015   ID:  Gregory [name], DOB Mar 13, 1941, MRN [id number]  PCP:  Noralee Space, MD  Cardiologist:  Dr. Kirk Ruths     Chief Complaint  Patient presents with  . Atrial Flutter     History of Present Illness: Gregory [name] is a 74 y.o. 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  Last saw Dr. Kirk Ruths in 2014 for dyspnea. Echo 10/14 demonstrated normal LVF and mild diastolic dysfunction with mild MR. Myoview in 2009 was normal.   Admitted 5/24-6/1. He presented with headaches and right lower extremity weakness. CT of the head demonstrated a brain mass measuring 3 x 2 cm with midline shift and edema.  This was a ring enhancing mass in the left basal ganglia.  He underwent stereotactic biopsy by neurosurgery. He was diagnosed with a brain abscess.  Notes from ID indicate his cultures grew out Peptostreptococcus sp.  Patient was followed by infectious disease in the hospital and placed on vancomycin, Rocephin and Flagyl. This was to be continued for one month post discharge. He was also placed on Keppra for seizure prophylaxis. Hospitalization was complicated by hyponatremia. 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    He knows Dr. Stanford Breed personally from their church. He called Dr. Stanford Breed last night with elevated heart rates. EMS was contacted. ECG apparently showed atrial flutter with RVR. He went to the hospital.  At [facility], he was noted to be in normal sinus rhythm.  CEs, CXR were unremarkable there.  Dr. Stanford Breed received a picture of the ECG from EMS and sent it to me today.  This appears to be typical AFlutter with HR 152.  The patient was recently seen by ID and his Vancomycin was DC'd.  He has remained on Flagyl.  His Rocephin was transitioned to oral Levofloxacin last week.  Since then he has  noted a higher than usual HR and his BP is higher than usual.  Highest BP he noted at home was in the 130s.  He had sudden onset of palpitations yesterday and he was aware of when his HR slowed back down.  The patient denies chest pain, shortness of breath, syncope, orthopnea, PND or significant pedal edema.    Studies/Reports Reviewed Today:  Echo 09/2013 - Left ventricle: The cavity size was normal. Wall thickness was normal. Systolic function was normal. The estimated ejection fraction was in the range of 55% to 60%. Doppler parameters are consistent with abnormal left ventricular relaxation (grade 1 diastolic dysfunction). - Mitral valve: Mild regurgitation.  Nuclear study in October of 2009 EF 58% and normal perfusion.    Past Medical History  Diagnosis Date  . Other diseases of nasal cavity and sinuses(478.19)   . Pure hypercholesterolemia   . Unspecified hypothyroidism   . Irritable bowel syndrome   . Benign neoplasm of colon   . Hypertrophy of prostate with urinary obstruction and other lower urinary tract symptoms (LUTS)   . Lumbago   . Plantar fascial fibromatosis   . Anxiety state, unspecified   . Persistent disorder of initiating or maintaining sleep   . GERD (gastroesophageal reflux disease)   . Arthritis   . 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 History of nonmelanoma skin cancer  EXCISION SQUAMOUS CELL FROM HAND  . 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 Varicocelectomy  1991    Dr. Tresa Endo  . 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 Other surgical history      pulse laser for facial telangiectasias inthe past  . Excision left wrist ganglian/ myxoid cyst  05-30-2009  . Cardiovascular stress  test  01-14-2003    NO ISCHEMIA / EF 61%/ NORMAL LE WALL MOTION  . Transthoracic echocardiogram  10-17-2008   DR CRENSHAW    NORMAL LVF/ EF 60%/  MILDLY DILATED RIGHT ATRIUM/ VENTRICULE  . 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 Renda Rolls  . Brain biopsy Left 05/16/2015    Procedure: Stereotactic Left Brain Biopsy with Brain Lab;  Surgeon: Consuella Lose, MD;  Location: [place];  Service: Neurosurgery;  Laterality: Left;  Stereotactic Left Brain biopsy with brainlab  . Application of cranial navigation N/A 05/16/2015    Procedure: APPLICATION OF CRANIAL NAVIGATION;  Surgeon: Consuella Lose, MD;  Location: [place] [facility];  Service: Neurosurgery;  Laterality: N/A;     Current Outpatient Prescriptions  Medication Sig Dispense Refill  . acetaminophen (TYLENOL) 325 MG tablet Take 650 mg by mouth daily as needed (pain).    . Ascorbic Acid (VITAMIN C) 1000 MG tablet Take 1,000 mg by mouth daily.     . chlorpheniramine-HYDROcodone (TUSSIONEX PENNKINETIC ER) 10-8 MG/5ML SUER Take 5 mLs by mouth every 12 (twelve) hours as needed for cough. 140 mL 0  . Cholecalciferol (VITAMIN D3) 2000 UNITS TABS Take 2,000 Units by mouth daily.    . clidinium-chlordiazePOXIDE (LIBRAX) 5-2.5 MG per capsule Take 1 capsule by mouth 3 (three) times daily as needed. 60 capsule 0  . clorazepate (TRANXENE) 7.5 MG tablet TAKE 1 TABLET BY MOUTH 3 TIMES A DAY AS NEEDED 90 tablet 1  . Diphenhyd-Hydrocort-Nystatin (FIRST-DUKES MOUTHWASH) SUSP 1 tsp gargle and swallow four times daily 120 mL 5  . fluconazole (DIFLUCAN) 100 MG tablet Take 1 tablet (100 mg total) by mouth daily. 10 tablet 0  . HYDROcodone-homatropine (HYCODAN) 5-1.5 MG/5ML syrup Take 5 mLs by mouth every 4 (four) hours as needed for cough. 240 mL 0  . levofloxacin (LEVAQUIN) 750 MG tablet Take 1 tablet (750 mg total) by mouth daily. 21 tablet 0  . levothyroxine (SYNTHROID, LEVOTHROID) 125 MCG tablet TAKE 1 TABLET (125 MCG TOTAL) BY MOUTH DAILY  BEFORE BREAKFAST. 30 tablet 5  . loratadine (CLARITIN) 10 MG tablet Take 10 mg by mouth daily.    . Magnesium 250 MG TABS Take 250 mg by mouth at bedtime.     . meclizine (ANTIVERT) 25 MG tablet Take 1 tablet (25 mg total) by mouth daily as needed for dizziness. 30 tablet 2  . metroNIDAZOLE (FLAGYL) 500 MG tablet Take 1 tablet (500 mg total) by mouth every 8 (eight) hours. 120 tablet 0  . Multiple Vitamin (MULTIVITAMIN WITH MINERALS) TABS tablet Take 1 tablet by mouth daily. Centrum    . ondansetron (ZOFRAN) 4 MG tablet Take 1 tablet (4 mg total) by mouth every 8 (eight) hours as needed for nausea or vomiting. 30 tablet 2  . pantoprazole (PROTONIX) 40 MG tablet Take 1 tablet (40 mg total) by mouth daily. 30 tablet 6  . promethazine (PHENERGAN) 12.5 MG tablet Take 1 tablet (  12.5 mg total) by mouth every 4 (four) hours as needed for nausea or vomiting. 30 tablet 5  . RAPAFLO 8 MG CAPS capsule TAKE ONE CAPSULE AT BEDTIME 30 capsule 5  . levETIRAcetam (KEPPRA) 500 MG tablet Take 1 tablet (500 mg total) by mouth daily. 60 tablet 0  . metoprolol succinate (TOPROL XL) 25 MG 24 hr tablet Take 1 tablet (25 mg total) by mouth daily. 30 tablet 5   No current facility-administered medications for this visit.    Allergies:   Nsaids; Other; Aspirin; and Statins    Social History:  The patient  reports that he has never smoked. He has never used smokeless tobacco. He reports that he does not drink alcohol or use illicit drugs.   Family History:  The patient's family history includes Diabetes in his mother; Hypertension in his mother and sister; Kidney failure in his father; Pancreatic cancer in his mother; Stroke in his mother.    ROS:   Please see the history of present illness.   Review of Systems  Constitution: Positive for decreased appetite and weight loss.  HENT: Positive for headaches.   Cardiovascular: Positive for irregular heartbeat and leg swelling.  All other systems reviewed and are  negative.     PHYSICAL EXAM: VS:  BP 104/70 mmHg  Pulse 84  Ht 5\' 10"  (1.778 m)  Wt 172 lb 3.2 oz (78.109 kg)  BMI 24.71 kg/m2    Wt Readings from Last 3 Encounters:  07/03/15 172 lb 3.2 oz (78.109 kg)  06/04/15 182 lb 6.4 oz (82.736 kg)  05/18/15 183 lb 12.8 oz (83.371 kg)     GEN: Well nourished, well developed, in no acute distress HEENT: normal Neck: no JVD,  no masses Cardiac:  Normal S1/S2, RRR; no murmur ,  no rubs or gallops, no edema   Respiratory:  clear to auscultation bilaterally, no wheezing, rhonchi or rales. GI: soft, nontender, nondistended, + BS MS: no deformity or atrophy Skin: warm and dry  Neuro:  CNs II-XII intact, Strength and sensation are intact Psych: Normal affect   EKG:  EKG is ordered today.  It demonstrates:   NSR, HR 84, normal axis, QTc 491 ms, no ST changes   Recent Labs: 05/13/2015: ALT 16* 05/16/2015: Magnesium 1.9 07/02/2015: BUN 9; Creatinine, Ser 1.21; Hemoglobin 15.3; Platelets 281; Potassium 3.5; Sodium 139 07/03/2015: TSH 3.28    Lipid Panel    Component Value Date/Time   CHOL 146 04/03/2015 0840   TRIG 85.0 04/03/2015 0840   HDL 34.60* 04/03/2015 0840   CHOLHDL 4 04/03/2015 0840   VLDL 17.0 04/03/2015 0840   LDLCALC 94 04/03/2015 0840   LDLDIRECT 139.4 06/15/2007 0946      ASSESSMENT AND PLAN:  Typical atrial flutter:  Patient notes worsening blood pressure and increased heart rate since starting on Levaquin. His heart rate is typically in the 70s. It has been in the 80s to 90s since starting on Levaquin. Blood pressure is typically in the low 100s to 110. Recently it has been in the 643P systolically. ECG from yesterday demonstrates what appears to be typical atrial flutter. The picture that I have is blurred somewhat. However, it is clearly not sinus rhythm.  CHADS2-VASc=1 (age over 36). He does not require long-term anticoagulation. Given his hereditary hemorrhagic telangiectasia and recent brain abscess, he would be high  risk for bleeding complications with anticoagulation.  Even if his thromboembolic risk was higher, I do not think he would be a candidate for anticoagulation.  His BP is more like his typical BP today.  I am not sure he would be able to tolerate a large dose of AV nodal blocking agents.  I will place him on Toprol-XL 12.5 mg QD x 3-4 days.  If he tolerates this, he will increase it to Toprol-XL 25 mg QD.  I will have him wear a 30 day Event Monitor to assess for recurrent arrhythmias.  I will review with Dr. Megan Salon to see if there is an alternate antibiotic that can be used.  I am not convinced that Levaquin caused his AFlutter.  But the patient has noted his HR and BP is higher since he started this drug.     Brain abscess:  FU with ID as noted.    OSLER-WEBER-RENDU DISEASE:  He has frequent epistaxis and lung AVMs.  I would avoid ASA as well.      Medication Changes: Current medicines are reviewed at length with the patient today.  Concerns regarding medicines are as outlined above.  The following changes have been made:   Discontinued Medications   No medications on file   Modified Medications   No medications on file   New Prescriptions   METOPROLOL SUCCINATE (TOPROL XL) 25 MG 24 HR TABLET    Take 1 tablet (25 mg total) by mouth daily.     Labs/ tests ordered today include:   Orders Placed This Encounter  Procedures  . TSH  . Cardiac event monitor  . EKG 12-Lead     Disposition:   FU with Dr. Kirk Ruths 3-4 weeks.    Signed, Versie Starks, MHS 07/03/2015 4:35 PM    [facility] [place], [place], [place]  [address] Phone: [phone]; Fax: [phone]

## 2015-07-04 NOTE — Telephone Encounter (Signed)
Returned call to patient to advise that he is fine to take Toprol XL. Wife advised they are headed to the beach today and I advised her to limit his sun exposure due the all the medications he is on and to call if she has any further questions.

## 2015-07-07 ENCOUNTER — Encounter: Payer: Self-pay | Admitting: Internal Medicine

## 2015-07-14 ENCOUNTER — Encounter: Payer: Self-pay | Admitting: Cardiology

## 2015-07-14 NOTE — Telephone Encounter (Signed)
Pt is calling in speak with Hilda Blades to see if the pt can be seen sooner than 8/26 for his follow up monitor appt. Please call  Thanks

## 2015-07-14 NOTE — Telephone Encounter (Signed)
This encounter was created in error - please disregard.

## 2015-07-15 ENCOUNTER — Telehealth: Payer: Self-pay | Admitting: Licensed Clinical Social Worker

## 2015-07-15 ENCOUNTER — Other Ambulatory Visit: Payer: Self-pay | Admitting: Licensed Clinical Social Worker

## 2015-07-15 MED ORDER — FLUCONAZOLE 100 MG PO TABS: 100.0000 mg | ORAL_TABLET | Freq: Every day | ORAL | Status: DC

## 2015-07-15 NOTE — Telephone Encounter (Signed)
Yes

## 2015-07-15 NOTE — Telephone Encounter (Signed)
Patient's wife called stating that he has thrush again and would like a refill of fluconazole. Is that ok?

## 2015-07-17 ENCOUNTER — Telehealth: Payer: Self-pay | Admitting: Cardiology

## 2015-07-17 ENCOUNTER — Telehealth: Payer: Self-pay | Admitting: Internal Medicine

## 2015-07-17 NOTE — Telephone Encounter (Signed)
HR still approx 120 bpm.  Currently Taking Toprol XL 12.5 mg daily.  Called around 8 pm today and was instructed to take an extra 12.5 mg which he did.  His BP remains stable around 100-110.  I do not want to uptitrate quickly since he is on a long actin preparation.  However, I believe he will tolerate another 12.5 mg now and he is to call office in the AM or visit ED if he becomes symptomatic.

## 2015-07-17 NOTE — Telephone Encounter (Signed)
Pt went into a flutter presumed tonight with elevated HR.  He is on toprol XL 25 half a tab a day, he will take another half tablet now,  If still uncomfortable they will call back in a few hours.

## 2015-07-18 NOTE — Telephone Encounter (Signed)
Spoke to wife regarding call from yesterday.  She states patient had been taking 12.5mg  daily of toprol, w/ 12.5mg  additional PRN - they had been instructed to move up to 25mg  daily  - reluctant d/t being unsure of BP parameters.  BP taken this AM 729 systolic. Patient took 12.5mg  toprol this AM. I advised OK to take 12.5mg  w/ goal to make the daily dose 25mg  per [facility] instructions - continue to check BP, HR regularly; alert [place] for HR <02, systolic <111, esp w/ symptoms.  Advised if any other concerns or change to call.   Routing to primary physician for additional advice.

## 2015-07-18 NOTE — Telephone Encounter (Signed)
Agree with toprol 25 mg daily and monitor BP and HR. Kirk Ruths

## 2015-07-18 NOTE — Telephone Encounter (Signed)
Pt wife is calling,said she was told to call this morning to see uf he needs to come in or what to do.what

## 2015-07-21 ENCOUNTER — Ambulatory Visit: Payer: Medicare Other | Admitting: Cardiology

## 2015-07-21 ENCOUNTER — Ambulatory Visit (INDEPENDENT_AMBULATORY_CARE_PROVIDER_SITE_OTHER): Payer: Medicare Other | Admitting: Internal Medicine

## 2015-07-21 ENCOUNTER — Encounter: Payer: Self-pay | Admitting: Internal Medicine

## 2015-07-21 VITALS — BP 129/71 | HR 64 | Temp 97.6°F | Wt 170.2 lb

## 2015-07-21 DIAGNOSIS — M6289 Other specified disorders of muscle: Secondary | ICD-10-CM

## 2015-07-21 DIAGNOSIS — I780 Hereditary hemorrhagic telangiectasia: Secondary | ICD-10-CM

## 2015-07-21 DIAGNOSIS — G060 Intracranial abscess and granuloma: Secondary | ICD-10-CM | POA: Diagnosis present

## 2015-07-21 DIAGNOSIS — R531 Weakness: Secondary | ICD-10-CM

## 2015-07-21 DIAGNOSIS — R42 Dizziness and giddiness: Secondary | ICD-10-CM | POA: Diagnosis not present

## 2015-07-21 NOTE — Progress Notes (Signed)
Patient ID: DEKENDRICK UZELAC, male   DOB: 1941-10-03, 74 y.o.   MRN: [id number]         [facility]  Patient Active Problem List   Diagnosis Date Noted  . Brain abscess     Priority: High  . Encounter for therapeutic drug monitoring   . Right sided weakness 05/13/2015  . Osteoarthritis 04/01/2015  . Iron deficiency anemia due to chronic blood loss 10/08/2014  . Hypoxemia 02/21/2014  . 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 DOE (dyspnea on exertion) 11/21/2013  . Nocturnal leg cramps 06/11/2013  . Cough, persistent 09/27/2012  . Neck strain 12/10/2011  . Leg pain 03/27/2011  . Obstruction to urinary outflow 03/23/2011  . CERVICAL STRAIN 06/09/2010  . Anxiety state 09/11/2009  . SINUSITIS, ACUTE 08/21/2009  . GANGLION CYST 04/10/2009  . PLANTAR FASCIITIS 01/30/2009  . DIZZINESS 09/17/2008  . CHEST PAIN 09/13/2008  . HYPERCHOLESTEROLEMIA, MILD 07/25/2008  . CARCINOMA, SKIN, SQUAMOUS CELL 02/10/2008  . INSOMNIA, CHRONIC 02/10/2008  . Refractory obstruction of nasal airway 02/10/2008  . BRONCHITIS 02/10/2008  . GERD 02/10/2008  . BPH (benign prostatic hypertrophy) with urinary obstruction 02/10/2008  . 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 COLONIC POLYPS 02/08/2008  . Hypothyroidism 02/08/2008  . Irritable bowel syndrome 02/08/2008  . BACK PAIN, LUMBAR 02/08/2008    Patient's Medications  New Prescriptions   No medications on file  Previous Medications   ACETAMINOPHEN (TYLENOL) 325 MG TABLET    Take 650 mg by mouth daily as needed (pain).   ASCORBIC ACID (VITAMIN C) 1000 MG TABLET    Take 1,000 mg by mouth daily.    CHLORPHENIRAMINE-HYDROCODONE (TUSSIONEX PENNKINETIC ER) 10-8 MG/5ML SUER    Take 5 mLs by mouth every 12 (twelve) hours as needed for cough.   CHOLECALCIFEROL (VITAMIN D3) 2000 UNITS TABS    Take 2,000 Units by mouth daily.   CLIDINIUM-CHLORDIAZEPOXIDE (LIBRAX) 5-2.5 MG PER CAPSULE    Take 1  capsule by mouth 3 (three) times daily as needed.   CLORAZEPATE (TRANXENE) 7.5 MG TABLET    TAKE 1 TABLET BY MOUTH 3 TIMES A DAY AS NEEDED   DIPHENHYD-HYDROCORT-NYSTATIN (FIRST-DUKES MOUTHWASH) SUSP    1 tsp gargle and swallow four times daily   FLUCONAZOLE (DIFLUCAN) 100 MG TABLET    Take 1 tablet (100 mg total) by mouth daily.   HYDROCODONE-HOMATROPINE (HYCODAN) 5-1.5 MG/5ML SYRUP    Take 5 mLs by mouth every 4 (four) hours as needed for cough.   LEVETIRACETAM (KEPPRA) 500 MG TABLET    Take 1 tablet (500 mg total) by mouth daily.   LEVOTHYROXINE (SYNTHROID, LEVOTHROID) 125 MCG TABLET    TAKE 1 TABLET (125 MCG TOTAL) BY MOUTH DAILY BEFORE BREAKFAST.   LORATADINE (CLARITIN) 10 MG TABLET    Take 10 mg by mouth daily.   MAGNESIUM 250 MG TABS    Take 250 mg by mouth at bedtime.    MECLIZINE (ANTIVERT) 25 MG TABLET    Take 1 tablet (25 mg total) by mouth daily as needed for dizziness.   METOPROLOL SUCCINATE (TOPROL XL) 25 MG 24 HR TABLET    Take 1 tablet (25 mg total) by mouth daily.   MULTIPLE VITAMIN (MULTIVITAMIN WITH MINERALS) TABS TABLET    Take 1 tablet by mouth daily. Centrum   ONDANSETRON (ZOFRAN) 4 MG TABLET    Take 1 tablet (4 mg total) by mouth every 8 (eight) hours as needed for nausea or vomiting.   PANTOPRAZOLE (PROTONIX)  40 MG TABLET    Take 1 tablet (40 mg total) by mouth daily.   PROMETHAZINE (PHENERGAN) 12.5 MG TABLET    Take 1 tablet (12.5 mg total) by mouth every 4 (four) hours as needed for nausea or vomiting.   RAPAFLO 8 MG CAPS CAPSULE    TAKE ONE CAPSULE AT BEDTIME  Modified Medications   No medications on file  Discontinued Medications   LEVOFLOXACIN (LEVAQUIN) 750 MG TABLET    Take 1 tablet (750 mg total) by mouth daily.   METRONIDAZOLE (FLAGYL) 500 MG TABLET    Take 500 mg by mouth 4 (four) times daily.    Subjective: Mr. Kimura is in with his wife for his follow-up for his recent polymicrobial brain abscess. He has now completed 66 total days of antibiotics therapy.  He ran out of his levofloxacin last week but continues taking his oral metronidazole. His right sided weakness has resolved completely and his handwriting is back to his baseline. He continues to have anorexia and a metallic taste in his mouth. He has lost about 15 pounds. He has had no fever, chills or sweats. He did have some transient headache, visual disturbance, shaking of his hands and elevated blood pressure. That resolved spontaneously. He has continued to have some tachycardia. He was started on metoprolol and is currently wearing a heart monitor. He has not had any chest pain.  Review of Systems: Pertinent items are noted in HPI.  Past Medical History  Diagnosis Date  . Other diseases of nasal cavity and sinuses(478.19)   . Pure hypercholesterolemia   . Unspecified hypothyroidism   . Irritable bowel syndrome   . Benign neoplasm of colon   . Hypertrophy of prostate with urinary obstruction and other lower urinary tract symptoms (LUTS)   . Lumbago   . Plantar fascial fibromatosis   . Anxiety state, unspecified   . Persistent disorder of initiating or maintaining sleep   . GERD (gastroesophageal reflux disease)   . Arthritis   . 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 History of nonmelanoma skin cancer EXCISION SQUAMOUS CELL FROM HAND  . 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 Smoking status: Never Smoker   . Smokeless tobacco: Never Used  . Alcohol Use: No    Family History  Problem Relation Age of Onset  . Diabetes Mother   . Hypertension Mother   . Pancreatic cancer Mother   . Stroke Mother   . Kidney failure Father   . Hypertension Sister     Allergies  Allergen Reactions  . Nsaids Other (See Comments)    Cannot take-per MD  . Other Other (See Comments)    All blood  thinners-cannot take per MD  . Aspirin Other (See Comments)    nosebleeds  . Statins Other (See Comments)    Weakness, myalgias    Objective: Temp: 97.6 F (36.4 C) (08/01 1433) Temp Source: Oral (08/01 1433) BP: 129/71 mmHg (08/01 1433) Pulse Rate: 64 (08/01 1433)  General: His weight is down 2 more pounds to 170 Skin: No rash Lungs: Clear Cor: Regular S1 and S2 with no murmurs Abdomen: Soft and nontender Neuro: Alert with normal speech and conversation. His strength is normal in all extremities  CT HEAD WITHOUT AND WITH CONTRAST 06/24/2015  TECHNIQUE: Contiguous axial  images were obtained from the base of the skull through the vertex without and with intravenous contrast  CONTRAST: 30mL OMNIPAQUE IOHEXOL 300 MG/ML SOLN  COMPARISON: 05/13/2015 and 09/19/2008 head CT.  FINDINGS: Interval left frontal burr-hole procedure/craniotomy.  Significant interval improvement when compared to the 05/13/2015 examination. The ring-enhancing lesion which was centered in the left basal ganglia has decreased significantly in size. Currently, 2.3 x 0.9 cm region of relatively solid enhancement superior aspect of the left basal ganglia remains as versus prior 3.1 x 1.9 cm ring-enhancing lesion. Significant decrease in surrounding vasogenic edema and marked decrease mass effect upon the left lateral ventricle.  What remains may represent residua of treated abscess. This will need to be followup until complete clearance. As this clears it would be helpful to exclude the possibility of underlying vascular abnormality in this patient with Osler-Weber-Rendu.  No intracranial hemorrhage. No CT evidence of large acute thrombotic infarct.  IMPRESSION: Significant interval improvement when compared to the 05/13/2015 examination.  The ring-enhancing lesion which was centered in the left basal ganglia has decreased significantly in size. Currently, 2.3 x 0.9 cm region of  relatively solid enhancement superior aspect of the left basal ganglia remains as versus prior 3.1 x 1.9 cm ring-enhancing lesion.  Significant decrease in surrounding vasogenic edema and marked decrease mass effect upon the left lateral ventricle.  What remains may represent residua of treated abscess. This will need to be followup until complete clearance. As this clears it would be helpful to exclude the possibility of underlying vascular abnormality in this patient with Osler-Weber-Rendu.   Electronically Signed  By: Genia Del M.D.  On: 06/24/2015 09:23                                       Assessment: I suspect that his brain abscess has been cured. I will have him stop metronidazole now. It is almost certainly the cause of the metallic taste in his mouth, anorexia and recent weight loss. I'm not so certain that his recent tachycardia is related to his antibiotics but we will certainly get a better idea about this after he stops it. I will plan on a repeat brain CT scan in 3 weeks.  Plan: 1. Discontinue metronidazole and observe off of antibiotics 2. Brain CT scan in 3 weeks 3. Follow-up in one month   Michel Bickers, MD [facility] [phone] pager   [phone] cell 07/21/2015, 3:03 PM

## 2015-07-24 ENCOUNTER — Other Ambulatory Visit: Payer: Self-pay | Admitting: Pulmonary Disease

## 2015-07-24 MED ORDER — LEVOTHYROXINE SODIUM 125 MCG PO TABS: ORAL_TABLET | ORAL | Status: DC

## 2015-07-31 ENCOUNTER — Encounter: Payer: Self-pay | Admitting: Pulmonary Disease

## 2015-07-31 ENCOUNTER — Ambulatory Visit (INDEPENDENT_AMBULATORY_CARE_PROVIDER_SITE_OTHER): Payer: Medicare Other | Admitting: Pulmonary Disease

## 2015-07-31 ENCOUNTER — Other Ambulatory Visit (INDEPENDENT_AMBULATORY_CARE_PROVIDER_SITE_OTHER): Payer: Medicare Other

## 2015-07-31 VITALS — BP 100/60 | HR 60 | Temp 98.0°F | Wt 167.2 lb

## 2015-07-31 DIAGNOSIS — E098 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: Secondary | ICD-10-CM

## 2015-07-31 DIAGNOSIS — R208 Other disturbances of skin sensation: Secondary | ICD-10-CM

## 2015-07-31 DIAGNOSIS — R490 Dysphonia: Secondary | ICD-10-CM

## 2015-07-31 DIAGNOSIS — I780 Hereditary hemorrhagic telangiectasia: Secondary | ICD-10-CM | POA: Diagnosis not present

## 2015-07-31 DIAGNOSIS — G060 Intracranial abscess and granuloma: Secondary | ICD-10-CM | POA: Diagnosis not present

## 2015-07-31 DIAGNOSIS — R200 Anesthesia of skin: Secondary | ICD-10-CM

## 2015-07-31 LAB — BASIC METABOLIC PANEL
BUN: 13 mg/dL (ref 6–23)
CHLORIDE: 106 meq/L (ref 96–112)
CO2: 30 mEq/L (ref 19–32)
Calcium: 9.7 mg/dL (ref 8.4–10.5)
Creatinine, Ser: 1 mg/dL (ref 0.40–1.50)
GFR: 77.9 mL/min (ref >60.00)
Glucose, Bld: 103 mg/dL — ABNORMAL HIGH (ref 70–99)
Potassium: 4.5 mEq/L (ref 3.5–5.1)
Sodium: 142 mEq/L (ref 135–145)

## 2015-07-31 LAB — CBC WITH DIFFERENTIAL/PLATELET
BASOS PCT: 0.4 % (ref 0.0–3.0)
Basophils Absolute: 0 10*3/uL (ref 0.0–0.1)
Eosinophils Absolute: 0.1 10*3/uL (ref 0.0–0.7)
Eosinophils Relative: 0.9 % (ref 0.0–5.0)
HEMATOCRIT: 46.1 % (ref 39.0–52.0)
HEMOGLOBIN: 15.4 g/dL (ref 13.0–17.0)
LYMPHS ABS: 3.5 10*3/uL (ref 0.7–4.0)
LYMPHS PCT: 37.4 % (ref 12.0–46.0)
MCHC: 33.4 g/dL (ref 30.0–36.0)
MCV: 87.6 fl (ref 78.0–100.0)
Monocytes Absolute: 1.2 10*3/uL — ABNORMAL HIGH (ref 0.1–1.0)
Monocytes Relative: 13.3 % — ABNORMAL HIGH (ref 3.0–12.0)
Neutro Abs: 4.5 10*3/uL (ref 1.4–7.7)
Neutrophils Relative %: 48 % (ref 43.0–77.0)
Platelets: 255 10*3/uL (ref 150.0–400.0)
RBC: 5.26 Mil/uL (ref 4.22–5.81)
RDW: 15.3 % (ref 11.5–15.5)
WBC: 9.4 10*3/uL (ref 4.0–10.5)

## 2015-07-31 LAB — HEPATIC FUNCTION PANEL
ALK PHOS: 59 U/L (ref 39–117)
ALT: 13 U/L (ref 0–53)
AST: 19 U/L (ref 0–37)
Albumin: 4 g/dL (ref 3.5–5.2)
BILIRUBIN DIRECT: 0.1 mg/dL (ref 0.0–0.3)
BILIRUBIN TOTAL: 0.5 mg/dL (ref 0.2–1.2)
Total Protein: 6.8 g/dL (ref 6.0–8.3)

## 2015-07-31 LAB — HEMOGLOBIN A1C: Hgb A1c MFr Bld: 5.7 % (ref 4.6–6.5)

## 2015-07-31 NOTE — Patient Instructions (Addendum)
Today we updated your med list in our EPIC system...    Continue your current medications the same...  Today we checked your f/u lab work...    We will contact you w/ the results when available...   We will arrange for an ENT appt to evaluate your hoarseness...  We will arrange for a Neurology appt to evaluate you for a possible neuropathy...  For the weight loss> be sure to add-in a nutritional supplement like ENSURE< BOOST< SUSTACAL 2 cans per day...  Call for any questions or if we can be of further service in any way.Marland KitchenMarland Kitchen

## 2015-08-03 ENCOUNTER — Encounter: Payer: Self-pay | Admitting: Pulmonary Disease

## 2015-08-03 NOTE — Progress Notes (Signed)
Subjective:    Patient ID: Gregory [name], male    DOB: 1941-07-19, 74 y.o.   MRN: [id number]  HPI 74 y/o WM here for a follow up visit... he has multiple medical problems including:  Hx OWR syndrome;  Hx atypCP;  Hyperchol;  Hypothyroidism;  GERD/ Divertics/ IBS/ Colon polyps;  BPH/ BOO;  DJD/ LBP/ Plantar fasciitis;  Hx dizziness;  Anxiety & chronic persistant insomnia... 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  LABS 10/14:  FLP- on diet alone looks pretty good w/ LDL=111...  EKG 10/14 showed SBrady, rate57, otherw wnl;  2DEcho 10/14 showed normal LV size & wall thickness, norm LVF w/ EF=55-60%, Gr1DD, mildly thickened AoV leaflets, mild MR;  ETT 11/14 showed 90min on treadmill, hypertensive BP response, rare PVC, no ischemia, O2 sats dropped to 87% w/ exercise...  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 Given this mild airflow obstruction we will start Rx w/ Dulera100- 2spBid   CXR 3/15 showed normal heart size, clear lungs, mild apical scarring, right basilar density c/w AVM based on CT report 2004, NAD.Marland KitchenMarland Kitchen  EKG 3/15 showed NSR, rate87, low volt, NAD...  ADDENDUM> LABS 4/15 showed>  FLP- TChol 137, TG 30, HDL 36, LDL 95;  Chems- wnl;  CBC- anemic w/ Hg=11.3 & MCV=77;  TSH=9.26...   Ret for Iron panel (Fe=16- 4%sat), Ferritin (6.4), Stool cards (pending); then start FeSO4-$RemoveBeforeDE'325mg'qnBpxAoSsdpcyVm$ BID w/ VitC500... Recheck CBC, Iron level in 6-8weeks...   Taking Synthroid112 regularly, therefore increase back to 119mcg/d... Recheck TSH in 52mo=> he never returned.  ~  October 08, 2014:  4mo [name] & Gregory [name] is still working but  has cut back to 2d/wk; feeling better overall and most of his dyspnea symptoms resolved w/ FeSO4 7 return of his Hg to normal; energy is good & he exercises on treadmill, doing better on stairs, etc; he stopped the [facility] as his breathing improved back to baseline...  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    Hx CP>  Prev cardiac w/u by Hilary Hertz was neg, no recurrent CP complaints, he remains active but notes DOE w/ stairs, ok on level ground, 2DEcho & GXT were OK x O2 sat drop to 87% at peak exercise- ?incr shunting thru his RLL AVM...    Chol> Intol to Simva40 & Cres5 due to musc cramps; FLP 4/15 on diet alone showed TChol 137, TG 30, HDL 36, LDL 95...    Hypothy>  On Synthroid 165mcg/d now>  Labs 10/15 revealed TSH= 5.10 & clinically euthyroid, continue same.Marland KitchenMarland Kitchen  GI- GERD, IBS, Polyps> on Prilosec40Qd & Zofran/ Librax prn; followed by [facility] & seen 6/15> note reviewed (heme pos stool & anemia from epistaxis & NSAIDs); he had colonoscopy 3/12- neg x for hems & DrM rec f/u 63yr...    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    Anxiety/ Insomnia>  He has Chlorazepate7.5 for Prn  use, and Ambien10 to use for sleep Qhs... We reviewed prob list, meds, xrays and labs> see below for updates >> OK 2015 Flu shot today...   LABS 10/15:  CBC- wnl w/ Hg= 16.5, Fe=88 (23%sat), Ferritin=36;  TSH=5.10... REC>  Continue Iron supplement daily, and the Synthroid125/d...   ~  April 01, 2015:  57m53moV & Gregory [name] reports a good interval>  He has notes some intermittent & persistent nose bleeds due to his OWR & nasal obstruction, he uses nasal saline & will f/u w/ ENT- DrRosen... He also notes some discomfort in knees, hips, back but he can't take strong NSAIDs due to OWR & risk of GI bleeds; we discussed trial of Tramadol50 w/ tylenol to see how this works on a prn basis... His 3rd complaint is his chronic persistent insomnia- only sleeping 4h per night & difficulty getting back to sleep; we reviewed a few strategies to deal w/ this- he notes that Tranxene taken when he arises will help him go back to sleep 7 no hangover- this is a win-win strategy & he will continue... 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    Hx CP>  Prev cardiac w/u by DrCHilary Hertzs neg, no recurrent CP- he remains active but notes DOE w/ stairs, ok on level ground, 2DEcho & GXT were OK x O2 sat drop to 87% at peak exercise- ?incr shunting thru his RLL AVM...    Chol> Intol to Simva40 & Cres5 due to musc cramps; FLP 4/16 on diet alone showed TChol 146, TG 85, HDL 35, LDL 94...    Hypothy>  On Synthroid 125m157m now>  Labs 4/16 revealed TSH= 4.38 &  clinically euthyroid, continue same...    GI- GERD, IBS, Polyps> on Prilosec40Qd & Zofran/ Librax prn; [facility]> note reviewed (heme pos stool & anemia from epistaxis & NSAIDs); he had colonoscopy 3/12- neg x for hems & DrM rec f/u 67yrs72yr   GU>  Hx BPH w/ BOO on Rapaflo 8mg/d82mr DrEskridge; last seen 1/16 & doing satis & pt reports that his PSA was OK...    Ortho> on OTC meds prn; eval 12/11 by DrNorris for bilat leg pain & cramping, ?  min atrophy of left gastroc- he rec tonic water & neurology eval> he saw DrWillis 3/12 (note reviewed)> NCV was normal; EMG c/w mild S1 radiculopathy; he rec MRI- pt decided against the MRI;  He reports some discomfort in knees, hips, back & we decided on trial of Tramadol50...    Anxiety/ Insomnia>  He has Chlorazepate7.5 for Prn use, and Ambien10 to use for sleep Qhs... We reviewed prob list, meds, xrays and labs> see below for updates >>   CXR 4/16 showed norm heart size, clear lungs w/ some hyperinflation, RLL AVM noted w/o change...  EKG 4/16 revealed poor tracing- NSR, rate68, minor NSSTTWA, NAD...   LABS 4/16:  FLP- at goals on diet alone;  Chems- wnl;  CBC- wnl;  TSH=4.38...  ~  June 04, 2015:  30moROV & post hosp check>  [facility] - 05/21[facility]15 by [facility] after presenting w/ HA & right leg weakness; he tells me he saw his dentist for routine cleaning 2wks prior to this; eval revealed a brain lesion in left basal ganglionic region (tumor vs abscess); he had brain stereotactic bx 5/27 by DrNundkumar (left frontal craniectomy) & thick pus was removed- cultures +peptostreptococcus species (otherw neg aerobic bact cult, AFB, Fungi); he was seen by ID & placed on Rochepin, Vanco, Flagyl to be continued 194moost disch (they are checking levels and adjusting dose); also started on Keppra500Bid per NS;  Since disch he is feeling better- no f/c/s, notes sl dry cough/ no sput, & the cough is keeping him awake- TUSSIONEX called in & helps he says;   Sl nausea relieved by phenergan prn...  EXAM reveals Afeb, VSS, O2sat=94% on RA at rest;  HEENT- OWR telangiectasias w/o change;  Chest- few basilar crackles w/o wheezing, rhonchi, consolidation;  Heart- RR w/o m/r/g;  Abd- neg;  Ext- neg w/o c/c/e...  CT Head 5/16 showed 2 x 3 cm LEFT basal ganglia mass, likely tumor vs abscess, moderately extensive surrounding edema, small surgical clips within or near the posterior wall LEFT maxillary sinus/LEFT pterygopalatine fossa, in this patient with history of nose bleed (these are stable from prior scans)...  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   EKG 5/16 showed NSR, rate68, wnl, NAD...Marland KitchenMarland KitchenVen Dopplers 6/16 were neg for DVT  LABS- reviewed> only pos cult= peptostreptococcus from brain bx;  Chems- ok x Na & Ca sl low;  CBC- ok x WBC 14=>11... 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 Given IS, refill Phenergan & Tussionex...  ~  July 31, 2015:  17mo34mo & add-on appt requested for several complaints>  Gregory [name] Gregory [name] finished his antibiotic Rx for brain abscess (likely due to dental work several weeks prior to onset of neuro symptoms in [id number] DrCampbell has arranged for a follow up CT Brain 08/11/15 (this will be 3 wks off antibiotics);  He had an  episode of tachycardia (?AFlutter  w/ rvr per EMS) w/ HR incr to 170's- went to ER 7/13 (but was in NSR) & he is currently wearing a Holter monitor per DrCrenshaw w/ follow-up Cards appt sched for next week...        He presents today w/ several complaints>  1) hoarseness: note this is intermittent, off & on, swallowing OK, denies indigestion/ reflux/ etc; he is on Protonix40 w/ his hx of OWR, plus zofran/ librax; we reviewed antireflux regimen & agreed to refer him to ENT for a look at his larynx & their opinion.Marland KitchenMarland Kitchen  2) c/o numbness in toes and bottom of his feet bilat for the last week or so w/ some discomfort while walking; no culprit meds on his list 7 we offered referral to Neuro for a neuropathy evaluation.Marland KitchenMarland Kitchen  3) he is c/o weight loss> weight today is 167#, 5'10" Tall, BMI=24 which is wnl; last wt=182# 24mo ago for a 15# wt loss, appetite is good & intake unchanged he says; we discussed intake caloric intake and nutritional supplements; we also checked labs> Chems- wnl, BS=103, A1c=5.7;  CBC- wnl; Note- he is on Synthroid167mcg/d and TSH= 3.28 when checked 07/03/15...   CXR 07/02/15 showed norm heart size, clear lungs, NAD.Marland KitchenMarland Kitchen            Problem List:   BRAIN ABSCESS  >>  Gregory [name] was hosp 5/24 - 05/21/15 by [facility] after presenting w/ HA & right leg weakness; he tells me he saw his dentist for routine cleaning 2wks prior to this; eval revealed a brain lesion in left basal ganglionic region (tumor vs abscess); he had brain stereotactic bx 5/27 by DrNundkumar (left frontal craniectomy) & thick pus was removed- cultures +peptostreptococcus species (otherw neg aerobic bact cult, AFB, Fungi); he was seen by ID & placed on Rochepin, Vanco, Flagyl to be continued 50mo post disch (they are checking levels and adjusting dose); also started on Keppra500Bid per NS... ~  6/16:  Since disch he is feeling better- no f/c/s, notes sl dry cough/ no sput, & the cough is keeping him awake- TUSSIONEX called in & helps he says;  Sl nausea relieved by phenergan prn...   ~  8/16:  He was checked by DrCampbell, ID- they stopped antibiotics and rec f/u CT Brain 08/11/15= 3 wks off the antibiotic rx...    OTHER DISEASES OF NASAL CAVITY AND SINUSES - Hx of Osler-Weber-Rendue syndrome/ hereditary telangiectasias... he's had numerous nose bleeds and surgery by Etter Sjogren, now followed by Barbaraann Faster... left nares is occluded w/ skin graft- no lumen... this is quite uncomfortable to the patient but he's been told there is nothing further that can be done for this... ~  3/11:  notes occas nosebleeds but he is able to handle them... ~  4/12:  No diff in his pattern of mild nose bleeds & he denies any severe epistaxis episodes... 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 ~  4/14:  He notes infreq bleeding now w/ saline etc... 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 ~  4/16:  He has Osler-Weber-Rendu syndrome w/ mult telangiectasias (skin, lips, nasal w/ prev nose bleeds- mult surg & occluded nares, AVM  in RLL on CXR w/o hemoptysis);  Stable- doing satis w/o recurrent bleeding from nose.  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 Dyspnea w/ O2 desat w/ exercise >>  ~  12/14:  PFTs showed FVC=3.98 (89%), FEV1=2.72 (79%), %1sec=68, and mid-flows= 51% predicted; c/w mild obstructive dis & we decided on a trial of Dulera100-2spBid ~  4/15:  His breathing is improved w/ the [facility] and exercise program... ~  10/15:  His breathing is back to baseline after FeSO4 rx for anemia & ret of Hg to normal; he has stopped the [facility] & has it handy for prn use... ~  4/16:  prev on Dulera100-2spBid, now just prn; breathing improved w/ exercise; O2 sat= 93% on RA today; c/o dry  throat & encouraged to use lozenges etc... ~  CXR 4/16 showed norm heart size, clear lungs w/ some hyperinflation, RLL AVM noted w/o change... 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 known GI telangiectasia as well without signif GI bleeding in the past- followed by [facility]... he's also has skin telangiectasis w/ prev laser therapy at [facility]... extensive nasal problems as above... ~  labs 2/09 showed Hg= 17.0 ~  labs 2/10 showed Hg= 16.4 ~  CXR 3/11 is chr incr markings esp RLL- no change, NAD...  ~  Labs 6/11 showed Hg= 15.9 ~  Labs 4/12 showed Hg= 16.0 ~  CXR 10/12 showed mild biapical scarring, RLL AVM, NAD... ~  Labs 4/13 showed Hg= 15.2 ~  CXR 8/13 showed heart at upper lim of norm, increased markings & apical scarring, chr incr markings in RLL- AVM, NAD.[name]. 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 ~  CXR 3/15 showed normal heart size, clear lungs, mild apical scarring, right basilar density c/w AVM based on CT report 2004, NAD.[name]. ~  CXR 4/16 showed norm heart size, clear lungs w/ some hyperinflation, RLL AVM noted w/o change... 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  Hx of CHEST PAIN (ICD-786.50) - seen Sep09 w/ several recent bouts of CP while working in yard, washing car, etc... resolved w/ rest & Protonix... EKG showed WNL, prev  [facility] neg (no ischemia or infarction and EF= 61%)... Cardiac eval DrCrenshaw w/ 2DEcho 10/09 showing norm LV- no regional wall motion abn, norm LVF w/ EF= 60%, mild dil RA/ RV;  and norm MYOVIEW (no ischemia or infarction, EF= 58%)... ~  10/14: repeat cardiac eval by DrCrenshaw due to [place] w/ stairs; EKG 10/14 showed SBrady, rate57, otherw wnl;  2DEcho 10/14 showed normal LV size & wall thickness, norm LVF w/ EF=55-60%, Gr1DD, mildly thickened AoV leaflets, mild MR;  ETT 11/14 showed 53min on treadmill, hypertensive BP response, rare PVC, no ischemia, O2 sats dropped to 87% w/ exercise ~  EKG 3/15 showed NSR, rate87, low volt, NAD.Marland KitchenMarland Kitchen   Episode of AFLUTTER >> this occurred 06/2015 & notes from [facility] & Richardson Dopp are reviewed); placed on lose dose TOPROL-XL & titrated up to $Rem'25mg'pjaH$ /d... Holter monitor placed and results pending...   HYPERCHOLESTEROLEMIA, MILD (ICD-272.0) - now on CRESTOR $RemoveBe'5mg'loJTQWQZU$ /d>  Prev Rx w/ Simva40 but developed muscle cramping which resolved off the med. ~  FLP 6/08 showed TChol 208, TG 75, HDL 34, LDL 139... he preferred diet Rx- ~  FLP 2/09 showed TChol 172, TG 69, HDL 30, LDL 129... he agreed to [place]... ~  [place] 8/09 on Crestor10 showed TChol 91, TG 49, HDL 28, LDL 54... rec- keep same. ~  FLP 2/10 on Crestor10 showed TChol 108, TG 56, HDL 31, LDL 66 ~  9/10:  we discussed changing Crestor10 to Simvastatin40 for $$$ reasons... ~  [place] 3/11 on Simva40 showed TChol 105, TG 59, HDL 39, LDL 54... continue same. ~  [place] 4/12 on Simva40 showed TChol 104, TG 62, HDL 29, LDL 63... C/o severe muscle cramps & wants to stop for 51mo> cramping resolved! ~  Referred to [facility] & they restarted his Crestor at $RemoveBe'5mg'MVoMqBkJt$ /d> tol well so far... ~  FLP 10/12 on Cres5 showed TChol 114, TG 40, HDL 36, LDL 70 ~  FLP 2/13 on Cres5 showed TChol 124, TG 21, HDL 40, LDL 80 ~  FLP 4/14 on Cres5 showed TChol 119, TG 44, HDL 38, LDL 72  ~  6/14: pt stopped Cres5 on his  own due to leg cramps & decided to hold statins for now w/ repeat FLP off meds in several months... ~  FLP 10/14 on diet alone showed TChol 155, TG 29, HDL 38, LDL 111  ~  FLP 4/15 on diet alone showed TChol 137, TG 30, HDL 36, LDL 95 ~  FLP 4/16 on diet alone showed TChol 146, TG 85, HDL 35, LDL 94  HYPOTHYROIDISM - on SYNTHROID 185mcg/d now; prev 182mcg/d dose was decreased 4/12... ~  labs 2/09 showed TSH= 0.69...  8/09 showed TSH= 0.44... ~  labs 2/10 showed TSH= 0.68 ~  labs 3/11 showed TSH= 0.52 ~  Labs 4/12 on Synthroid125 showed TSH= 0.33... We decided to decr the dose to 112/d. ~  Labs 4/13 on Levo112 showed TSH= 4.60 ~  Labs 4/14 on Levo112 showed TSH= 5.46 ~  Labs 4/15 on Levo112 showed TSH= 9.26 and we decided to incr dose back to 163mcg/d... ~  Labs 4/16 on Levo125 showed TSH= 4.38  GERD, IRRITABLE BOWEL SYNDROME, COLONIC POLYPS - followed by [facility]... on Protonix40 (he tried OTC Zantac $RemoveBe'75mg'tVdPLITwL$  on his own)... also takes  Hansen...  ~  colonoscopy 1/04 w/ divertics, diminutive rectal polyp (adenomatous), hems...  ~  f/u colon 1/07 showed no recurrent polyps... ~  colonoscopy 3/12 at [facility]- neg x for hems & [facility] rec f/u 89yrs...  HYPERTROPHY PROSTATE W/UR OBST & OTH LUTS (ICD-600.01) - followed by HTDSKAJG on RAPAFLO $RemoveBe'8mg'AbRTbVyMZ$ /d (stopped Flomax due to side effect- ED)... doing well without LTOS, just complains of nocturia x 1-2 ~  labs 2/09 showed PSA= 0.76 ~  labs 2/10 showed PSA= 0.90 ~  2011 PSA checked by DrDavis (pt states it was OK)... ~  Labs 4/12 showed PSA= 1.22 ~  Labs 4/13 showed PSA= 1.31 ~  10/13: presents c/o incr freq, nocturia x2-4, & weak stream despite Rapaflo; he is intol to Flomax; refer to Urology for further eval=> seen by DrEskridge, no changes  made... ~  1/15: f/u by Urology, DrEskridge> BPH, LUTS, hypersens unstable bladder; on Rapaflo8 & doing satis, PSA reported to be 2.34...  LEFT ARM & SHOULDER PAIN >> he was checked by Lacie Scotts but  not improving he says; we discussed second opinion at the [facility], DrSypher==> notes reviewed.  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 ~  9/10:  requesting Rx for Robaxin for muscle spasm as needed.  PLANTAR FASCIITIS (ICD-728.71) - c/o pain in his feet secondary to plantar fasciitis eval & Rx from [facility] w/ Celebrex & shots... ~  9/10: we discussed change to [facility] - symptoms improved.  Hx NOCTURNAL LEG CRAMPS >>  ~  This initially occurred several yrs ago while on Simvastatin & the cramps resolved off this med; he was subseq placed on Cres5 & tol well... ~  6/14: he indicates that noct leg cramps returned x1wk & he stopped the Cres5 w/ improvement; he wants to leave off the statins for now; rec to try tonic water/ yellow mustard prn...  Hx of DIZZINESS (ICD-780.4) - sudden episode dizziness Sep09 working at [employer] around [employer] w/ room spinning- lasted 76min and resolved spontaneously, felt light headed afterwards... no LOC, syncope, seizure activity, etc... no CP/ palpit/ SOB/ etc... there were several EMTs in the restaurant and they checked him out- stable and transported to ER... exam was neg-  they did labs: all norm, didn't do scans (can't get MRI due to metallic clamp in sinus), he can't take ASA due to bleeding from his OWR.[name]. treated w/ MECLIZINE w/ improvement.  ANXIETY (ICD-300.00) & INSOMNIA, CHRONIC (ICD-307.42) - he uses CHLORAZEPATE 7.5mg  Prn & AMBIEN10mg Maudie Flakes for chronic persistant insomnia...  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  ANEMIA >>  ~  LABS 4/13 showed Hg= 15.2, MCV=91 ~  LABS 4/14 showed Hg= 14.5, MCV=84 ~  LABS 3/15 showed Hg= 13.6, MCV=78 ~  LABS 4/15 showed Hg= 11.3, MCV= 77, Iron studies showed Fe= 16 (3.6%sat), Ferritin= 6.4, & stool cards neg; Rx w/ FeSO4... ~  Labs 10/15 showed Hg=  16.5, MCV= 91, Fe=88 (23%sat), Ferritin= 36...  ~  Labs 4/16 showed Hg= 16.0  Health Maintenance: ~  GI:  followed by [facility] & up to date on colon etc... (last 1/07 & 79yr f/u due 1/12). ~  GU:  followed by DrRDavis in past; PSAs have all been wnl... ~  Immunizations:  he gets yearly Flu vaccinations in the fall;  OK Pneumovax 03/12/10 at age[age];     Past Surgical History  Procedure Laterality Date  . Varicocelectomy  1991    Dr. Tresa Endo  . 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 Other surgical history      pulse laser for facial telangiectasias inthe past  . Excision left wrist ganglian/ myxoid cyst  05-30-2009  . Cardiovascular stress test  01-14-2003    NO ISCHEMIA / EF 61%/ NORMAL LE WALL MOTION  . Transthoracic echocardiogram  10-17-2008   DR CRENSHAW    NORMAL LVF/ EF 60%/  MILDLY DILATED RIGHT ATRIUM/ VENTRICULE  . 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 Renda Rolls  . Brain biopsy Left 05/16/2015    Procedure: Stereotactic Left Brain Biopsy with Brain Lab;  Surgeon: Consuella Lose,  MD;  Location: [place] [facility]UR[facility];  Service: Neurosurgery;  Laterality: Left;  Stereotactic Left Brain biopsy with brainlab  . Application of cranial navigation N/A 05/16/2015    Procedure: APPLICATION OF CRANIAL NAVIGATION;  Surgeon: Consuella Lose, MD;  Location: [place] [facility];  Service: Neurosurgery;  Laterality: N/A;    Outpatient Encounter Prescriptions as of 07/31/2015  Medication Sig  . acetaminophen (TYLENOL) 325 MG tablet Take 650 mg by mouth daily as needed (pain).  . Ascorbic Acid (VITAMIN C) 1000 MG tablet Take 1,000 mg by mouth daily.   . chlorpheniramine-HYDROcodone (TUSSIONEX PENNKINETIC ER) 10-8 MG/5ML SUER Take 5 mLs by mouth every 12 (twelve) hours as needed for cough.  . Cholecalciferol (VITAMIN D3) 2000 UNITS TABS Take 2,000 Units by mouth daily.  . clidinium-chlordiazePOXIDE (LIBRAX) 5-2.5 MG per capsule Take 1  capsule by mouth 3 (three) times daily as needed.  . clorazepate (TRANXENE) 7.5 MG tablet TAKE 1 TABLET BY MOUTH 3 TIMES A DAY AS NEEDED  . Diphenhyd-Hydrocort-Nystatin (FIRST-DUKES MOUTHWASH) SUSP 1 tsp gargle and swallow four times daily  . fluconazole (DIFLUCAN) 100 MG tablet Take 1 tablet (100 mg total) by mouth daily.  [name] HYDROcodone-homatropine (HYCODAN) 5-1.5 MG/5ML syrup Take 5 mLs by mouth every 4 (four) hours as needed for cough.  . levothyroxine (SYNTHROID, LEVOTHROID) 125 MCG tablet TAKE 1 TABLET (125 MCG TOTAL) BY MOUTH DAILY BEFORE BREAKFAST.  [name] loratadine (CLARITIN) 10 MG tablet Take 10 mg by mouth daily.  . Magnesium 250 MG TABS Take 250 mg by mouth at bedtime.   . meclizine (ANTIVERT) 25 MG tablet Take 1 tablet (25 mg total) by mouth daily as needed for dizziness.  . metoprolol succinate (TOPROL XL) 25 MG 24 hr tablet Take 1 tablet (25 mg total) by mouth daily.  . Multiple Vitamin (MULTIVITAMIN WITH MINERALS) TABS tablet Take 1 tablet by mouth daily. Centrum  . ondansetron (ZOFRAN) 4 MG tablet Take 1 tablet (4 mg total) by mouth every 8 (eight) hours as needed for nausea or vomiting.  . pantoprazole (PROTONIX) 40 MG tablet Take 1 tablet (40 mg total) by mouth daily.  . promethazine (PHENERGAN) 12.5 MG tablet Take 1 tablet (12.5 mg total) by mouth every 4 (four) hours as needed for nausea or vomiting.  [name] RAPAFLO 8 MG CAPS capsule TAKE ONE CAPSULE AT BEDTIME  . levETIRAcetam (KEPPRA) 500 MG tablet Take 1 tablet (500 mg total) by mouth daily.   No facility-administered encounter medications on file as of 07/31/2015.    Allergies  Allergen Reactions  . Nsaids Other (See Comments)    Cannot take-per MD  . Other Other (See Comments)    All blood thinners-cannot take per MD  . Aspirin Other (See Comments)    nosebleeds  . Statins Other (See Comments)    Weakness, myalgias     Current Medications, Allergies, Past Medical History, Past Surgical History, Family History, and  Social History were reviewed in [employer] record.    Review of Systems       See HPI - other systems neg except as noted... The patient denies anorexia, fever, weight loss, weight gain, vision loss, decreased hearing, hoarseness, chest pain, syncope, dyspnea on exertion, peripheral edema, prolonged cough, headaches, hemoptysis, abdominal pain, melena, hematochezia, severe indigestion/heartburn, hematuria, incontinence, muscle weakness, suspicious skin lesions, transient blindness, difficulty walking, depression, unusual weight change, abnormal bleeding, enlarged lymph nodes, and angioedema.     Objective:   Physical Exam      WD, WN, 73  y/o WM in NAD... 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 GENERAL:  Alert & oriented; pleasant & cooperative... HEENT:  Melbeta/AT, EOM-wnl, PERRLA, EACs-clear, TMs-wnl, NOSE- occluded left nares from skin grafts, MOUTH- mucosal telangiectasias.  NECK:  Supple w/ fairROM; no JVD; normal carotid impulses w/o bruits; no thyromegaly or nodules palpated; no lymphadenopathy. CHEST:  Clear to P & A; without wheezes/ rales/ or rhonchi. HEART:  Regular Rhythm; without murmurs/ rubs/ or gallops. ABDOMEN:  Soft & nontender; normal bowel sounds; no organomegaly or masses detected. EXT: without deformities, mild arthritic changes; no varicose veins/ venous insuffic/ or edema. NEURO:  CN's intact; motor testing normal; sensory testing normal; gait normal & balance OK. DERM:  mult telangiectasias...  RADIOLOGY DATA:  Reviewed in the EPIC EMR & discussed w/ the patient...  LABORATORY DATA:  Reviewed in the EPIC EMR & discussed w/ the patient...   Assessment & Plan:    BRAIN ABSCESS >> [facility] 5/16, followed by ID, DrCampbell, finished antibiotic rx 07/2015 and f/u CT Brain- pending... He will need antibiotic prophylaxis prior to future dental work...  Hx Cough, Dyspnea on exertion, mild COPD >> as above, prev on Dulera100- 2sp Bid & exercise  program- improved; we will continue to monitor his status & O2 sats; Dyspnea improved w/ Fe for anemia & ret of Hg to norm... He has Tussionex prn cough... 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     Hx CP, AFlutter>  Prev cardiac w/u by DrCrenshaw was neg, no recurrent CP complaints, but he notes DOE w/ stairs & DrCrenshaw repeated 2DEcho & ETT as above... Episode of PAFlutter 06/2015=> holter monitor per cards- pending & pt improved on ToprolXL25...      Chol>  Off statins due to recurrent cramps, on diet alone & FLP is reasonable...     Hypothy>  On Synthroid 118mcg/d now>  He is clinically & biochem euthyroid...     GI>  Followed by [facility] & had colonoscopy 3/12- neg x for hems & DrM rec f/u 65yrs...     GU>  Hx BPH w/ BOO & followed by DrEskridge on Rapaflo $RemoveBe'8mg'hxUANuDTK$ /d & stable...     Ortho>  See above- prev stable on OTC analgesics as needed, but recent incr symptoms- try Tramadol50 prn...  LEG CRAMPS >> he is quite sure they are from his statin Rx & we decided to leave these off for now, try to control on diet/ exercise & continued monitoring... 8/16> c/o numbness in toes & bottom of feet 8/16>  Refer to Neuro for neuropathy eval...     Anxiety/ Insomnia>  He has Chlorazepate for Prn use, and Ambien to use for sleep Qhs...  Anemia>  On FeSO4 supplement & Hg now back to normal, dyspnea ret to baseline...   Patient's Medications  New Prescriptions   No medications on file  Previous Medications   ACETAMINOPHEN (TYLENOL) 325 MG TABLET    Take 650 mg by mouth daily as  needed (pain).   ASCORBIC ACID (VITAMIN C) 1000 MG TABLET    Take 1,000  mg by mouth daily.    CHLORPHENIRAMINE-HYDROCODONE (TUSSIONEX PENNKINETIC ER) 10-8 MG/5ML SUER    Take 5 mLs by mouth every 12 (twelve) hours as needed for cough.   CHOLECALCIFEROL (VITAMIN D3) 2000 UNITS TABS    Take 2,000 Units by mouth daily.   CLIDINIUM-CHLORDIAZEPOXIDE (LIBRAX) 5-2.5 MG PER CAPSULE    Take 1 capsule by mouth 3 (three) times daily as needed.   CLORAZEPATE (TRANXENE) 7.5 MG TABLET    TAKE 1 TABLET BY MOUTH 3 TIMES A DAY AS NEEDED   DIPHENHYD-HYDROCORT-NYSTATIN (FIRST-DUKES MOUTHWASH) SUSP    1 tsp gargle and swallow four times daily   FLUCONAZOLE (DIFLUCAN) 100 MG TABLET    Take 1 tablet (100 mg total) by mouth daily.   HYDROCODONE-HOMATROPINE (HYCODAN) 5-1.5 MG/5ML SYRUP    Take 5 mLs by mouth every 4 (four) hours as needed for cough.   LEVETIRACETAM (KEPPRA) 500 MG TABLET    Take 1 tablet (500 mg total) by mouth daily.   LEVOTHYROXINE (SYNTHROID, LEVOTHROID) 125 MCG TABLET    TAKE 1 TABLET (125 MCG TOTAL) BY MOUTH DAILY BEFORE BREAKFAST.   LORATADINE (CLARITIN) 10 MG TABLET    Take 10 mg by mouth daily.   MAGNESIUM 250 MG TABS    Take 250 mg by mouth at bedtime.    MECLIZINE (ANTIVERT) 25 MG TABLET    Take 1 tablet (25 mg total) by mouth daily as needed for dizziness.   METOPROLOL SUCCINATE (TOPROL XL) 25 MG 24 HR TABLET    Take 1 tablet (25 mg total) by mouth daily.   MULTIPLE VITAMIN (MULTIVITAMIN WITH MINERALS) TABS TABLET    Take 1 tablet by mouth daily. Centrum   ONDANSETRON (ZOFRAN) 4 MG TABLET    Take 1 tablet (4 mg total) by mouth every 8 (eight) hours as needed for nausea or vomiting.   PANTOPRAZOLE (PROTONIX) 40 MG TABLET    Take 1 tablet (40 mg total) by mouth daily.   PROMETHAZINE (PHENERGAN) 12.5 MG TABLET    Take 1 tablet (12.5 mg total) by mouth every 4 (four) hours as needed for nausea or vomiting.   RAPAFLO 8 MG CAPS CAPSULE    TAKE ONE CAPSULE AT BEDTIME  Modified Medications   No  medications on file  Discontinued Medications   No medications on file

## 2015-08-04 NOTE — Progress Notes (Signed)
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  Admitted 5/16 with headache and right lower extremity weakness. CT of the head demonstrated a brain mass measuring 3 x 2 cm with midline shift and edema. This was a ring enhancing mass in the left basal ganglia. He underwent stereotactic biopsy by neurosurgery. He was diagnosed with a brain abscess. Notes from ID indicate his cultures grew out Peptostreptococcus sp. Patient was followed by infectious disease in the hospital and placed on vancomycin, Rocephin and Flagyl. This was to be continued for one month post discharge. He was also placed on Keppra for seizure prophylaxis. 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   Following DC, patient had an episode of elevated heart rate. EMS was called and he was noted to be in atrial flutter. This converted to sinus rhythm spontaneously. Patient placed on toprol. Event monitor showed slow atrial flutter versus ectopic atrial tachycardia. Since he was last seen patient denies dyspnea, chest pain, palpitations or syncope.   Studies/Reports Reviewed Today:  Echo 09/2013 - Left ventricle: The cavity size was normal. Wall thickness was normal. Systolic function was normal. The estimated ejection fraction was in the range of 55% to 60%. Doppler parameters are consistent with abnormal left ventricular relaxation (grade 1 diastolic dysfunction). - Mitral valve: Mild regurgitation.  Nuclear study in October of 2009 EF 58% and normal perfusion.   Current Outpatient Prescriptions  Medication Sig Dispense Refill  . acetaminophen (TYLENOL) 325 MG tablet Take 650 mg by mouth daily as needed (pain).    . Ascorbic Acid (VITAMIN C) 1000 MG tablet Take 1,000 mg by mouth daily.     . Cholecalciferol (VITAMIN D3) 2000 UNITS TABS Take 2,000 Units  by mouth daily.    . clidinium-chlordiazePOXIDE (LIBRAX) 5-2.5 MG per capsule Take 1 capsule by mouth 3 (three) times daily as needed. 60 capsule 0  . clorazepate (TRANXENE) 7.5 MG tablet TAKE 1 TABLET BY MOUTH 3 TIMES A DAY AS NEEDED 90 tablet 1  . Diphenhyd-Hydrocort-Nystatin (FIRST-DUKES MOUTHWASH) SUSP 1 tsp gargle and swallow four times daily 120 mL 5  . gabapentin (NEURONTIN) 300 MG capsule Start 1 tablet at bedtime for one week, if tolerating, increase to 1 tablet twice daily. 60 capsule 5  . levothyroxine (SYNTHROID, LEVOTHROID) 125 MCG tablet TAKE 1 TABLET (125 MCG TOTAL) BY MOUTH DAILY BEFORE BREAKFAST. 30 tablet 5  . loratadine (CLARITIN) 10 MG tablet Take 10 mg by mouth daily.    . Magnesium 250 MG TABS Take 250 mg by mouth at bedtime.     . meclizine (ANTIVERT) 25 MG tablet Take 1 tablet (25 mg total) by mouth daily as needed for dizziness. 30 tablet 2  . metoprolol succinate (TOPROL XL) 25 MG 24 hr tablet Take 1 tablet (25 mg total) by mouth daily. 30 tablet 5  . Multiple Vitamin (MULTIVITAMIN WITH MINERALS) TABS tablet Take 1 tablet by mouth daily. Centrum    . ondansetron (ZOFRAN) 4 MG tablet Take 1 tablet (4 mg total) by mouth every 8 (eight) hours as needed for nausea or vomiting. 30 tablet 2  . pantoprazole (PROTONIX) 40 MG tablet Take 1 tablet (40 mg total) by mouth daily. 30 tablet 6  . RAPAFLO 8 MG CAPS capsule TAKE ONE CAPSULE AT BEDTIME 30 capsule 5   No current facility-administered medications for this visit.     Past Medical  History  Diagnosis Date  . Other diseases of nasal cavity and sinuses(478.19)   . Pure hypercholesterolemia   . Unspecified hypothyroidism   . Irritable bowel syndrome   . Benign neoplasm of colon   . Hypertrophy of prostate with urinary obstruction and other lower urinary tract symptoms (LUTS)   . Lumbago   . Plantar fascial fibromatosis   . Anxiety state, unspecified   . Persistent disorder of initiating or maintaining sleep   . GERD  (gastroesophageal reflux disease)   . Arthritis   . 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 History of nonmelanoma skin cancer EXCISION SQUAMOUS CELL FROM HAND  . 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 Varicocelectomy  1991    Dr. Tresa Endo  . 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 Other surgical history      pulse laser for facial telangiectasias inthe past  . Excision left wrist ganglian/ myxoid cyst  05-30-2009  . Cardiovascular stress test  01-14-2003    NO ISCHEMIA / EF 61%/ NORMAL LE WALL MOTION  . Transthoracic echocardiogram  10-17-2008   DR CRENSHAW    NORMAL LVF/ EF 60%/  MILDLY DILATED RIGHT ATRIUM/ VENTRICULE  . 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 Renda Rolls  . Brain biopsy Left 05/16/2015    Procedure: Stereotactic Left Brain Biopsy with Brain Lab;  Surgeon: Consuella Lose, MD;  Location: [place] [facility];  Service: Neurosurgery;  Laterality: Left;  Stereotactic Left Brain biopsy with brainlab  . Application of cranial navigation N/A 05/16/2015    Procedure: APPLICATION OF CRANIAL NAVIGATION;  Surgeon: Consuella Lose, MD;  Location: [place] [facility];  Service: Neurosurgery;  Laterality: N/A;    Social History   Social History  . Marital Status: Married    Spouse Name: [name]  . Number of Children: 2  . Years of Education: N/A   Occupational History  .      Consultant   Social History Main Topics  . Smoking status: Never Smoker   . Smokeless tobacco: Never Used  . Alcohol Use: No  . Drug Use: No  . Sexual Activity: Not on file   Other Topics Concern  . Not on file   Social History Narrative   Lives with wife in a one story home.  Has 2 sons.  Retired Veterinary surgeon.  Education:  Masters degree.    ROS: peripheral neuropathy but no fevers or chills, productive cough, hemoptysis, dysphasia, odynophagia, melena, hematochezia, dysuria, hematuria, rash, seizure activity, orthopnea, PND, pedal edema, claudication. Remaining systems are negative.  Physical Exam: Well-developed well-nourished in no acute distress.  Skin is warm and dry.  HEENT is normal.  Neck is supple.  Chest is clear to auscultation with normal expansion.  Cardiovascular exam is regular rate and rhythm.  Abdominal exam nontender or distended. No masses palpated. Extremities show no edema. neuro grossly intact

## 2015-08-06 ENCOUNTER — Encounter: Payer: Self-pay | Admitting: Neurology

## 2015-08-06 ENCOUNTER — Ambulatory Visit (INDEPENDENT_AMBULATORY_CARE_PROVIDER_SITE_OTHER): Payer: Medicare Other | Admitting: Neurology

## 2015-08-06 VITALS — BP 98/60 | HR 64 | Ht 70.0 in | Wt 167.2 lb

## 2015-08-06 DIAGNOSIS — G609 Hereditary and idiopathic neuropathy, unspecified: Secondary | ICD-10-CM

## 2015-08-06 DIAGNOSIS — R202 Paresthesia of skin: Secondary | ICD-10-CM | POA: Diagnosis not present

## 2015-08-06 DIAGNOSIS — M5417 Radiculopathy, lumbosacral region: Secondary | ICD-10-CM | POA: Diagnosis not present

## 2015-08-06 MED ORDER — GABAPENTIN 300 MG PO CAPS: ORAL_CAPSULE | ORAL | Status: DC

## 2015-08-06 NOTE — Patient Instructions (Addendum)
1.  Start gabapentin 300mg  at bedtime for one week, if tolerating, increase to 1 tablet twice daily. 2.  Start out-patient physical therapy for leg strengthening 3.  If your symptoms worsen, we will schedule you for NCS/EMG of the legs 4.  Return to clinic in 6-8 weeks

## 2015-08-06 NOTE — Progress Notes (Signed)
[facility] Clinic Note - Initial Visit   Date: 08/06/2015  Gregory [name] MRN: [id number] DOB: 08/13/1941   Dear Dr. Lenna Gilford:  Thank you for your kind referral of Gregory [name] for consultation of bilateral feet numbness. Although his history is well known to you, please allow [place] to reiterate it for the purpose of our medical record. The patient was accompanied to the clinic by self.   History of Present Illness: Gregory [name] is a 74 y.o. right-handed Caucasian male with hypothyroidism, hyperlipidemia, Osler-Weber- Rendu disease, GERD, and recently diagnosed brain abscess presenting for evaluation of numbness/tingling of the feet.    He was hospitalized from May 24 - May 21 2015 with right sided leg weakness and headaches. Patient was subsequently found to have a brain mass 3 x 2 cm involving the basal ganglia with midline shift and edema seen on CT head.  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  Appropriate IV antibiotics including vancomycin, rocephin, and flagyl was started.  He was also started on keppra 500mg  BID.  Of note, he had routine dental work 2 weeks prior to symptom onset.  He has completed his course of antibiotics therapy with IV for 6 weeks and transitioned to oral levofloxacin and metronidazole for 4 weeks. He has been off antibiotics since early August.  He has been doing well and complains of mild right leg weakness. He is scheduled for repeat CT brain in mid-August.   Starting ~ late July 2016, he started noticing numbness/tingling of the toes and shooting pain involving the sole of the foot.  Symptoms are constant and involve both feet equally.  Shooting pain is worse with weight bearing.  Nothing alleviates the pain.  He endorses mild low back pain.   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  Stable 4 mm LEFT-to-RIGHT midline shift.  CT head 06/24/2015: IMPRESSION: Significant interval improvement when compared to the 05/13/2015 examination.  The ring-enhancing lesion which was centered in the left basal ganglia has decreased significantly in size. Currently, 2.3 x 0.9 cm region of relatively solid enhancement superior aspect of the left basal ganglia remains as versus prior 3.1 x 1.9 cm ring-enhancing lesion.  Significant decrease in surrounding vasogenic edema and marked decrease mass effect upon the left lateral ventricle.  What remains may represent residua of treated abscess. This will need to be followup until complete clearance. As this clears it would be helpful to exclude the possibility of underlying vascular abnormality in this patient with Osler-Weber-Rendu.  Lab Results  Component Value Date   TSH 3.28 07/03/2015   Lab Results  Component Value Date   HGBA1C 5.7 07/31/2015    Past Medical History  Diagnosis Date  . Other diseases of nasal cavity and sinuses(478.19)   . Pure hypercholesterolemia   . Unspecified hypothyroidism   . Irritable bowel syndrome   . Benign neoplasm of colon   . Hypertrophy of prostate with urinary obstruction and other lower urinary tract symptoms (LUTS)   . Lumbago   . Plantar fascial fibromatosis   . Anxiety state, unspecified   . Persistent disorder of initiating or maintaining sleep   .  GERD (gastroesophageal reflux disease)   . Arthritis   . 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 History of nonmelanoma skin cancer  EXCISION SQUAMOUS CELL FROM HAND  . 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 Varicocelectomy  1991    Dr. Tresa Endo  . 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 Other surgical history      pulse laser for facial telangiectasias inthe past  . Excision left wrist ganglian/ myxoid cyst  05-30-2009  . Cardiovascular stress test  01-14-2003    NO ISCHEMIA / EF 61%/ NORMAL LE WALL MOTION  . Transthoracic echocardiogram  10-17-2008   DR CRENSHAW    NORMAL LVF/ EF 60%/  MILDLY DILATED RIGHT ATRIUM/ VENTRICULE  . 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 Renda Rolls  . Brain biopsy Left 05/16/2015    Procedure: Stereotactic Left Brain Biopsy with Brain Lab;  Surgeon: Consuella Lose, MD;  Location: [place] [facility];  Service: Neurosurgery;  Laterality: Left;  Stereotactic Left Brain biopsy with brainlab  . Application of cranial navigation N/A 05/16/2015    Procedure: APPLICATION OF CRANIAL NAVIGATION;  Surgeon: Consuella Lose, MD;  Location: [place] [facility];  Service: Neurosurgery;  Laterality: N/A;     Medications:  Outpatient Encounter Prescriptions as of 08/06/2015  Medication Sig  . acetaminophen (TYLENOL) 325 MG tablet Take 650 mg by mouth daily as needed (pain).  . Ascorbic Acid (VITAMIN C) 1000 MG tablet Take 1,000 mg by mouth daily.   . Cholecalciferol (VITAMIN D3) 2000 UNITS TABS Take 2,000 Units by mouth daily.  . clidinium-chlordiazePOXIDE (LIBRAX) 5-2.5 MG per capsule Take 1 capsule by mouth 3 (three) times daily as needed.  . clorazepate (TRANXENE) 7.5 MG tablet TAKE 1 TABLET BY MOUTH 3 TIMES A DAY AS NEEDED  . Diphenhyd-Hydrocort-Nystatin (FIRST-DUKES MOUTHWASH) SUSP 1 tsp gargle and swallow four times daily  . levothyroxine (SYNTHROID, LEVOTHROID) 125 MCG tablet TAKE 1 TABLET (125 MCG TOTAL) BY MOUTH DAILY BEFORE BREAKFAST.  [name]  loratadine (CLARITIN) 10 MG tablet Take 10 mg by mouth daily.  . Magnesium 250 MG TABS Take 250 mg by mouth at bedtime.   . meclizine (ANTIVERT) 25 MG tablet Take 1 tablet (25 mg total) by mouth daily as needed for dizziness.  . metoprolol succinate (TOPROL XL) 25 MG 24 hr tablet Take 1 tablet (25 mg total) by mouth daily.  . Multiple Vitamin (MULTIVITAMIN WITH MINERALS) TABS tablet Take 1 tablet by mouth daily. Centrum  . ondansetron (ZOFRAN) 4 MG tablet Take 1 tablet (4 mg total) by mouth every 8 (eight) hours as needed for nausea or vomiting.  . pantoprazole (PROTONIX) 40 MG tablet Take 1 tablet (40 mg total) by mouth daily.  [name] RAPAFLO 8 MG CAPS capsule TAKE ONE CAPSULE AT BEDTIME  . [DISCONTINUED] chlorpheniramine-HYDROcodone (TUSSIONEX PENNKINETIC ER) 10-8 MG/5ML SUER Take 5 mLs by mouth every 12 (twelve) hours as needed for cough.  . [DISCONTINUED] fluconazole (DIFLUCAN) 100 MG tablet Take 1 tablet (100 mg total) by mouth daily.  . [DISCONTINUED] HYDROcodone-homatropine (HYCODAN) 5-1.5 MG/5ML syrup Take 5 mLs by mouth every 4 (four) hours as needed for cough.  . [DISCONTINUED] levETIRAcetam (KEPPRA) 500 MG tablet Take 1 tablet (  500 mg total) by mouth daily.  . [DISCONTINUED] promethazine (PHENERGAN) 12.5 MG tablet Take 1 tablet (12.5 mg total) by mouth every 4 (four) hours as needed for nausea or vomiting.   No facility-administered encounter medications on file as of 08/06/2015.     Allergies:  Allergies  Allergen Reactions  . Nsaids Other (See Comments)    Cannot take-per MD  . Other Other (See Comments)    All blood thinners-cannot take per MD  . Aspirin Other (See Comments)    nosebleeds  . Statins Other (See Comments)    Weakness, myalgias    Family History: Family History  Problem Relation Age of Onset  . Diabetes Mother   . Hypertension Mother   . Pancreatic cancer Mother   . Stroke Mother   . Kidney failure Father   . Hypertension Sister     Social  History: Social History  Substance Use Topics  . Smoking status: Never Smoker   . Smokeless tobacco: Never Used  . Alcohol Use: No   Social History   Social History Narrative   Lives with wife in a one story home.  Has 2 sons.  Retired Veterinary surgeon.  Education: Masters degree.    Review of Systems:  CONSTITUTIONAL: No fevers, chills, night sweats, or weight loss.   EYES: No visual changes or eye pain ENT: No hearing changes.  No history of nose bleeds.   RESPIRATORY: No cough, wheezing and shortness of breath.   CARDIOVASCULAR: Negative for chest pain, and palpitations.   GI: Negative for abdominal discomfort, blood in stools or black stools.  No recent change in bowel habits.   GU:  No history of incontinence.   MUSCLOSKELETAL: No history of joint pain or swelling.  No myalgias.   SKIN: Negative for lesions, rash, and itching.   HEMATOLOGY/ONCOLOGY: Negative for prolonged bleeding, +bruising easily, and swollen nodes.  No history of cancer.   ENDOCRINE: Negative for cold or heat intolerance, polydipsia or goiter.   PSYCH:  No depression or anxiety symptoms.   NEURO: As Above.   Vital Signs:  BP 98/60 mmHg  Pulse 64  Ht 5\' 10"  (1.778 m)  Wt 167 lb 3 oz (75.836 kg)  BMI 23.99 kg/m2  SpO2 91% Pain Scale: 0 on a scale of 0-10   General Medical Exam:   General:  Well appearing, comfortable.   Eyes/ENT: see cranial nerve examination.   Neck: No masses appreciated.  Full range of motion without tenderness.  No carotid bruits. Respiratory:  Clear to auscultation, good air entry bilaterally.   Cardiac:  Regular rate and rhythm, no murmur.   Extremities:  No deformities, edema, or skin discoloration.  Skin:  No rashes or lesions.  Neurological Exam: MENTAL STATUS including orientation to time, place, person, recent and remote memory, attention span and concentration, language, and fund of knowledge is normal.  Speech is not dysarthric.  CRANIAL NERVES: II:  No visual  field defects.  Unremarkable fundi.   III-IV-VI: Pupils equal round and reactive to light.  Normal conjugate, extra-ocular eye movements in all directions of gaze.  No nystagmus.  No ptosis.   V:  Normal facial sensation.   VII:  Normal facial symmetry and movements.  No pathologic facial reflexes.  VIII:  Normal hearing and vestibular function.   IX-X:  Normal palatal movement.   XI:  Normal shoulder shrug and head rotation.   XII:  Normal tongue strength and range of motion, no deviation or fasciculation.  MOTOR:  No atrophy, fasciculations or abnormal movements.  No pronator drift.  Tone is normal.    Right Upper Extremity:    Left Upper Extremity:    Deltoid  5/5   Deltoid  5/5   Biceps  5/5   Biceps  5/5   Triceps  5/5   Triceps  5/5   Wrist extensors  5/5   Wrist extensors  5/5   Wrist flexors  5/5   Wrist flexors  5/5   Finger extensors  5/5   Finger extensors  5/5   Finger flexors  5/5   Finger flexors  5/5   Dorsal interossei  5/5   Dorsal interossei  5/5   Abductor pollicis  5/5   Abductor pollicis  5/5   Tone (Ashworth scale)  0  Tone (Ashworth scale)  0   Right Lower Extremity:    Left Lower Extremity:    Hip flexors  4+/5   Hip flexors  5/5   Hip extensors  4+/5   Hip extensors  4+/5   Knee flexors  4+/5   Knee flexors  4+/5   Knee extensors  5/5   Knee extensors  5/5   Dorsiflexors  5/5   Dorsiflexors  5/5   Plantarflexors  5-/5   Plantarflexors  5-/5   Toe extensors  5/5   Toe extensors  5/5   Toe flexors  5-/5   Toe flexors  5-/5   Tone (Ashworth scale)  0  Tone (Ashworth scale)  0   MSRs:  Right                                                                 Left brachioradialis 2+  brachioradialis 2+  biceps 2+  biceps 2+  triceps 2+  triceps 2+  patellar 3+  patellar 2+  ankle jerk 2+  ankle jerk 2+  Hoffman no  Hoffman no  plantar response up  plantar response down   SENSORY:  Reduced temperature and vibration at the ankles bilaterally.  Pin prick and  proprioception intact.  Romberg's sign absent.   COORDINATION/GAIT: Normal finger-to- nose-finger and heel-to-shin.  Intact rapid alternating movements bilaterally.  Able to rise from a chair without using arms.  Gait narrow based and stable. Tandem and stressed gait intact.    IMPRESSION: 1.  Bilateral feet paresthesias, discussed that this may be related to medication induced-peripheral neuropathy vs S1 radiculopathy.  Flagyl can sometimes cause side effects of neuropathy and would improve after being off the medication.  Alternatively, because he also has weakness of bilateral S1 myotomes with paresthesias over the same dermatome, sacral radiculopathy is also possible.  We discussed obtaining NCS/EMG to help localize his symptoms but because symptoms are not worsening, he would like to see how his symptoms evolve over the next few weeks.  There is mild atrophy of the left lower posterior leg compartment, which he seems to endorse for some time. In the meantime, gabapentin will be started for pain.  2.  Right basal ganglia polymicrobial abscess, now off antibiotics with repeat CT brain scheduled for August 22nd.  He has residual left leg weakness for which he will start PT.   PLAN/RECOMMENDATIONS:  1.  Start gabapentin 300mg  at bedtime for one week, if tolerating,  increase to 1 tablet twice daily. 2.  Start out-patient physical therapy for leg strengthening 3.  If symptoms worsen, schedule NCS/EMG of the legs.  Need to be cautious with needle electrode exam with his history of Osler-Weber- Rendu syndrome 4.  Return to clinic in 6-8 weeks   The duration of this appointment visit was 45 minutes of face-to-face time with the patient.  Greater than 50% of this time was spent in counseling, explanation of diagnosis, planning of further management, and coordination of care.   Thank you for allowing me to participate in patient's care.  If I can answer any additional questions, I would be pleased to  do so.    Sincerely,    Samwise Eckardt K. Posey Pronto, DO

## 2015-08-07 ENCOUNTER — Encounter: Payer: Self-pay | Admitting: *Deleted

## 2015-08-07 ENCOUNTER — Telehealth: Payer: Self-pay | Admitting: Neurology

## 2015-08-07 ENCOUNTER — Ambulatory Visit (INDEPENDENT_AMBULATORY_CARE_PROVIDER_SITE_OTHER): Payer: Medicare Other | Admitting: Cardiology

## 2015-08-07 ENCOUNTER — Encounter: Payer: Self-pay | Admitting: Cardiology

## 2015-08-07 VITALS — BP 96/42 | HR 72 | Ht 70.0 in | Wt 169.5 lb

## 2015-08-07 DIAGNOSIS — I4892 Unspecified atrial flutter: Secondary | ICD-10-CM | POA: Insufficient documentation

## 2015-08-07 DIAGNOSIS — G060 Intracranial abscess and granuloma: Secondary | ICD-10-CM

## 2015-08-07 NOTE — Assessment & Plan Note (Signed)
Patient is doing well following recent therapy for brain abscess. He is now off of anti-biotics. No recurrent fevers. There is no peripheral stigmata of SBE and brain abscess was felt related to AV malformation. It should be noted he had dental cleaning 2 weeks prior to his abscess. Plan echocardiogram to rule out vegetation. This seems less likely.

## 2015-08-07 NOTE — Telephone Encounter (Signed)
Spoke with patient and he would like PT done at [facility].  Referral sent.

## 2015-08-07 NOTE — Telephone Encounter (Signed)
Left message for patient to call me back. 

## 2015-08-07 NOTE — Telephone Encounter (Signed)
Pt Called concerning Phy.Theropy for his legs @ [facility] in [place]/call back @ [phone]

## 2015-08-07 NOTE — Assessment & Plan Note (Addendum)
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 He has had no further symptoms. Continue Toprol at 25 mg daily. Plan echocardiogram. I would be hesitant to anticoagulate given recent brain abscess and history of Osler Weber Rendu with AV malformations.

## 2015-08-07 NOTE — Patient Instructions (Signed)

## 2015-08-11 ENCOUNTER — Telehealth: Payer: Self-pay | Admitting: Neurology

## 2015-08-11 ENCOUNTER — Telehealth: Payer: Self-pay | Admitting: Internal Medicine

## 2015-08-11 ENCOUNTER — Other Ambulatory Visit: Payer: Self-pay | Admitting: *Deleted

## 2015-08-11 ENCOUNTER — Encounter (HOSPITAL_COMMUNITY): Payer: Self-pay

## 2015-08-11 ENCOUNTER — Ambulatory Visit (HOSPITAL_COMMUNITY)
Admission: RE | Admit: 2015-08-11 | Discharge: 2015-08-11 | Disposition: A | Discharge status: Home / Self Care | Payer: Medicare Other | Source: Ambulatory Visit | Attending: Internal Medicine | Admitting: Internal Medicine

## 2015-08-11 DIAGNOSIS — Z8673 Personal history of transient ischemic attack (TIA), and cerebral infarction without residual deficits: Secondary | ICD-10-CM | POA: Insufficient documentation

## 2015-08-11 DIAGNOSIS — G060 Intracranial abscess and granuloma: Secondary | ICD-10-CM | POA: Diagnosis not present

## 2015-08-11 DIAGNOSIS — R42 Dizziness and giddiness: Secondary | ICD-10-CM | POA: Diagnosis not present

## 2015-08-11 DIAGNOSIS — R29898 Other symptoms and signs involving the musculoskeletal system: Secondary | ICD-10-CM

## 2015-08-11 MED ORDER — IOHEXOL 300 MG/ML  SOLN
75.0000 mL | Freq: Once | INTRAMUSCULAR | Status: AC | PRN
  Administered 2015-08-11: 75 mL via INTRAVENOUS

## 2015-08-11 NOTE — Telephone Encounter (Signed)
Rx faxed

## 2015-08-11 NOTE — Telephone Encounter (Signed)
Gregory [name] with [facility] called in regards to PT, she said their was no script for therapy that was faxed over for the referral and they need a script/Dawn CB# [phone] Ext:[phone]

## 2015-08-11 NOTE — Telephone Encounter (Signed)
CT HEAD WITHOUT AND WITH CONTRAST  TECHNIQUE: Contiguous axial images were obtained from the base of the skull through the vertex without and with intravenous contrast  CONTRAST: 66mL OMNIPAQUE IOHEXOL 300 MG/ML SOLN  COMPARISON: CT head 06/24/2015, 05/13/2015  FINDINGS: Low-density edema lateral to the left basal ganglia shows improvement. There remains low-density in the external capsule which has improved. Previous area of enhancement in the external capsule left has resolved. No residual abscess.  No new areas of abscess formation.  Negative for hemorrhage. No acute infarct. Ventricle size normal. 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  Left frontal craniotomy for biopsy. No skull lesion.  IMPRESSION: Resolving abscess left external capsule. There is decreased edema and decreased enhancement in this area. No residual fluid collection. No other areas of acute abnormality.   Electronically Signed  By: Franchot Gallo M.D.  On: 08/11/2015 11:46  I called Gregory [name] and informed him that his followup brain CT scan continues to do so marked improvement. He is feeling much better after stopping antibiotics 3 weeks ago. I believe his brain abscess has been cured. I asked him to call me if he has any problems or concerns to suggest early relapse of his infection.

## 2015-08-14 ENCOUNTER — Ambulatory Visit (HOSPITAL_COMMUNITY): Payer: Medicare Other | Attending: Cardiology

## 2015-08-14 ENCOUNTER — Other Ambulatory Visit: Payer: Self-pay

## 2015-08-14 DIAGNOSIS — I371 Nonrheumatic pulmonary valve insufficiency: Secondary | ICD-10-CM | POA: Diagnosis not present

## 2015-08-14 DIAGNOSIS — I071 Rheumatic tricuspid insufficiency: Secondary | ICD-10-CM | POA: Insufficient documentation

## 2015-08-14 DIAGNOSIS — I5189 Other ill-defined heart diseases: Secondary | ICD-10-CM | POA: Insufficient documentation

## 2015-08-14 DIAGNOSIS — I340 Nonrheumatic mitral (valve) insufficiency: Secondary | ICD-10-CM | POA: Diagnosis not present

## 2015-08-14 DIAGNOSIS — E785 Hyperlipidemia, unspecified: Secondary | ICD-10-CM | POA: Insufficient documentation

## 2015-08-14 DIAGNOSIS — I4892 Unspecified atrial flutter: Secondary | ICD-10-CM

## 2015-08-15 ENCOUNTER — Ambulatory Visit: Payer: Medicare Other | Admitting: Cardiology

## 2015-08-19 ENCOUNTER — Telehealth: Payer: Self-pay | Admitting: Cardiology

## 2015-08-19 NOTE — Telephone Encounter (Signed)
Called patient. Results of echo given.  Also let him know they were on MyChart

## 2015-08-19 NOTE — Telephone Encounter (Signed)
Mr. Borgwardt is calling about results from his Echo done last week . Please call  Thanks

## 2015-08-21 ENCOUNTER — Telehealth: Payer: Self-pay | Admitting: Neurology

## 2015-08-21 NOTE — Telephone Encounter (Signed)
Pt requested to be taken off of med/ Gabapentin/ due to nausea/upset stomach/ or requests to stop med/ or try something else //[phone]

## 2015-08-21 NOTE — Telephone Encounter (Signed)
OK to stop gabapentin.  He can start nortripytyline 10mg  qhs, if agreeable please send rx.   Donika K. Posey Pronto, DO

## 2015-08-21 NOTE — Telephone Encounter (Signed)
Called patient back and informed him that he can stop the gabapentin.  Offered the nortriptyline but he said that he would like to see how he does without anything.  Instructed him to call me if he changes his mind.

## 2015-08-21 NOTE — Telephone Encounter (Signed)
Ok to stop?  Would you like to try something else?

## 2015-09-11 ENCOUNTER — Ambulatory Visit (INDEPENDENT_AMBULATORY_CARE_PROVIDER_SITE_OTHER): Payer: Medicare Other | Admitting: Neurology

## 2015-09-11 ENCOUNTER — Encounter: Payer: Self-pay | Admitting: Neurology

## 2015-09-11 ENCOUNTER — Other Ambulatory Visit (INDEPENDENT_AMBULATORY_CARE_PROVIDER_SITE_OTHER): Payer: Medicare Other

## 2015-09-11 VITALS — BP 90/54 | HR 91 | Ht 70.0 in | Wt 170.4 lb

## 2015-09-11 DIAGNOSIS — R202 Paresthesia of skin: Secondary | ICD-10-CM | POA: Diagnosis not present

## 2015-09-11 DIAGNOSIS — G620 Drug-induced polyneuropathy: Secondary | ICD-10-CM

## 2015-09-11 DIAGNOSIS — G060 Intracranial abscess and granuloma: Secondary | ICD-10-CM

## 2015-09-11 DIAGNOSIS — G609 Hereditary and idiopathic neuropathy, unspecified: Secondary | ICD-10-CM

## 2015-09-11 LAB — VITAMIN B12: VITAMIN B 12: 874 pg/mL (ref 211–911)

## 2015-09-11 LAB — FOLATE: Folate: >24.8 ng/mL (ref >5.9)

## 2015-09-11 NOTE — Progress Notes (Signed)
Follow-up Visit   Date: 09/11/2015   Gregory [name] MRN: [id number] DOB: 1941-10-20   Interim History: Gregory [name] is a 74 y.o. right-handed Caucasian male with hypothyroidism, hyperlipidemia, Osler-Weber- Rendu disease, GERD, and recently diagnosed brain abscess  returning to the clinic for follow-up of bilateral feet paresthesias.  The patient was accompanied to the clinic by self.  History of present illness: He was hospitalized from May 24 - May 21 2015 with right sided leg weakness and headaches. Patient was subsequently found to have a brain mass 3 x 2 cm involving the basal ganglia with midline shift and edema seen on CT head. 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 Appropriate IV antibiotics including vancomycin, rocephin, and flagyl was started. He was also started on keppra 500mg  BID. Of note, he had routine dental work 2 weeks prior to symptom onset.  He has completed his course of antibiotics therapy with IV for 6 weeks and transitioned to oral levofloxacin and metronidazole for 4 weeks. He has been off antibiotics since early August. He has been doing well and complains of mild right leg weakness. He is scheduled for repeat CT brain in mid-August.   Starting ~ late July 2016, he started noticing numbness/tingling of the toes and shooting pain involving the sole of the foot. Symptoms are constant and involve both feet equally. Shooting pain is worse with weight bearing. Nothing alleviates the pain. He endorses mild low back pain.  UPDATE 09/11/2015:  He no longer has tingling sensation of leg, but continues to have persistent numbness of the feet. Gabapentin was stopped because of GI side effects.  Physical therapy has significantly improved his leg strength, which he feels is back to baseline.  He has developed hoarseness and has seen ENT, who noted thin cords.  His hand writing is slowly improving, but  still not completely improved. No denies double vision, slurred speech, swallowing difficulty, or shortness of breath.  Interval CT head from August shows resolving abscess of the left external capsule with reduced edema.   Medications:  Current Outpatient Prescriptions on File Prior to Visit  Medication Sig Dispense Refill  . acetaminophen (TYLENOL) 325 MG tablet Take 650 mg by mouth daily as needed (pain).    . Ascorbic Acid (VITAMIN C) 1000 MG tablet Take 1,000 mg by mouth daily.     . Cholecalciferol (VITAMIN D3) 2000 UNITS TABS Take 2,000 Units by mouth daily.    . clidinium-chlordiazePOXIDE (LIBRAX) 5-2.5 MG per capsule Take 1 capsule by mouth 3 (three) times daily as needed. 60 capsule 0  . clorazepate (TRANXENE) 7.5 MG tablet TAKE 1 TABLET BY MOUTH 3 TIMES A DAY AS NEEDED 90 tablet 1  . Diphenhyd-Hydrocort-Nystatin (FIRST-DUKES MOUTHWASH) SUSP 1 tsp gargle and swallow four times daily 120 mL 5  . levothyroxine (SYNTHROID, LEVOTHROID) 125 MCG tablet TAKE 1 TABLET (125 MCG TOTAL) BY MOUTH DAILY BEFORE BREAKFAST. 30 tablet 5  . loratadine (CLARITIN) 10 MG tablet Take 10 mg by mouth daily.    . Magnesium 250 MG TABS Take 250 mg by mouth at bedtime.     . meclizine (ANTIVERT) 25 MG tablet Take 1 tablet (25 mg total) by mouth daily as needed for dizziness. 30 tablet 2  . metoprolol succinate (TOPROL XL) 25 MG 24 hr tablet Take 1 tablet (25 mg total) by mouth daily. 30 tablet 5  . Multiple Vitamin (MULTIVITAMIN WITH MINERALS) TABS tablet Take 1 tablet by  mouth daily. Centrum    . ondansetron (ZOFRAN) 4 MG tablet Take 1 tablet (4 mg total) by mouth every 8 (eight) hours as needed for nausea or vomiting. 30 tablet 2  . pantoprazole (PROTONIX) 40 MG tablet Take 1 tablet (40 mg total) by mouth daily. 30 tablet 6  . RAPAFLO 8 MG CAPS capsule TAKE ONE CAPSULE AT BEDTIME 30 capsule 5   No current facility-administered medications on file prior to visit.    Allergies:  Allergies  Allergen  Reactions  . Nsaids Other (See Comments)    Cannot take-per MD  . Other Other (See Comments)    All blood thinners-cannot take per MD  . Aspirin Other (See Comments)    nosebleeds  . Statins Other (See Comments)    Weakness, myalgias    Review of Systems:  CONSTITUTIONAL: No fevers, chills, night sweats, or weight loss.  EYES: No visual changes or eye pain ENT: No hearing changes.  No history of nose bleeds.   RESPIRATORY: No cough, wheezing and shortness of breath.   CARDIOVASCULAR: Negative for chest pain, and palpitations.   GI: Negative for abdominal discomfort, blood in stools or black stools.  No recent change in bowel habits.   GU:  No history of incontinence.   MUSCLOSKELETAL: No history of joint pain or swelling.  No myalgias.   SKIN: Negative for lesions, rash, and itching.   ENDOCRINE: Negative for cold or heat intolerance, polydipsia or goiter.   PSYCH:  No depression or anxiety symptoms.   NEURO: As Above.   Vital Signs:  BP 90/54 mmHg  Pulse 91  Ht 5\' 10"  (1.778 m)  Wt 170 lb 6 oz (77.282 kg)  BMI 24.45 kg/m2  SpO2 89%  Neurological Exam: MENTAL STATUS including orientation to time, place, person, recent and remote memory, attention span and concentration, language, and fund of knowledge is normal.  Speech is not dysarthric, mild hoarseness.  CRANIAL NERVES:  Pupils equal round and reactive to light.  Normal conjugate, extra-ocular eye movements in all directions of gaze.  No ptosis.  Face is symmetric. Palate elevates symmetrically.  Tongue is midline.  MOTOR:  Motor strength is 5/5 in all extremities (improved hip flexion bilaterally!).  No atrophy, fasciculations or abnormal movements.  No pronator drift.  Subtle increase in the tone of the legs.    MSRs:  Reflexes are 3+/4 throughout.  SENSORY:  Vibration is reduced at the ankles bilaterally.    COORDINATION/GAIT:  Normal finger-to- nose-finger. Intact rapid alternating movements bilaterally.  Gait  narrow based and stable. Tandem gait intact.  Able to stand from seated position without using arms.    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  Stable 4 mm LEFT-to-RIGHT midline shift.  CT head 06/24/2015: Significant interval improvement when compared to the 05/13/2015 examination. The ring-enhancing lesion which was centered in the left basal ganglia has decreased significantly in size. Currently, 2.3 x 0.9 cm region of relatively solid enhancement superior aspect of the left basal ganglia remains as versus prior 3.1 x 1.9 cm ring-enhancing lesion. Significant decrease in surrounding vasogenic edema and marked decrease mass effect upon the left lateral ventricle. What remains may represent residua of treated abscess. This will need to be followup until complete clearance. As this clears it would be helpful to exclude  the possibility of underlying vascular abnormality in this patient with Osler-Weber-Rendu.  CT head 08/11/2015: Resolving abscess left external capsule. There is decreased edema and decreased enhancement in this area. No residual fluid collection. No other areas of acute abnormality.   IMPRESSION/PLAN: 1. 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 Symptoms are predominately numbness and remain unchanged Discussed doing NCS/EMG to confirm neuropathy and determine whether it is predominately axonal vs. demyelinating for prognosis Check neuropathy labs No role for medication in the absence of painful paresthesias  2. Right basal ganglia polymicrobial abscess, completed course of antibiotic therapy.  CT brain from August 2016 was reviewed and shows improved  edema and abscess.  Clinically, he is doing well and no longer has any weakness on exam.  3.  Hoarseness, does not seem to be due to primary neurological cause, but will check myasthenia antibodies for completeness.   PLAN/RECOMMENDATIONS:  1.  Check vitamin B12, folate, vitamin B1, copper, vitamin B6, myasthenia panel 2.  EMG of the legs 3.  Continue outpatient therapy 4.  Return to clinic in 3-4 months    The duration of this appointment visit was 30 minutes of face-to-face time with the patient.  Greater than 50% of this time was spent in counseling, explanation of diagnosis, planning of further management, and coordination of care.   Thank you for allowing me to participate in patient's care.  If I can answer any additional questions, I would be pleased to do so.    Sincerely,    Donika K. Posey Pronto, DO

## 2015-09-11 NOTE — Patient Instructions (Addendum)
1.  Check vitamin B12, folate, vitamin B1, vitamin B6, copper, myasthenia panel 2.  EMG of the legs 3.  Continue outpatient therapy 4.  Return to clinic 3-4 months

## 2015-09-12 ENCOUNTER — Ambulatory Visit: Payer: Medicare Other | Admitting: Neurology

## 2015-09-15 LAB — VITAMIN B1: VITAMIN B1 (THIAMINE): 36 nmol/L — AB (ref 8–30)

## 2015-09-15 LAB — COPPER, SERUM: COPPER: 77 ug/dL (ref 70–175)

## 2015-09-16 ENCOUNTER — Ambulatory Visit (INDEPENDENT_AMBULATORY_CARE_PROVIDER_SITE_OTHER): Payer: Medicare Other | Admitting: Neurology

## 2015-09-16 DIAGNOSIS — G609 Hereditary and idiopathic neuropathy, unspecified: Secondary | ICD-10-CM

## 2015-09-16 DIAGNOSIS — G060 Intracranial abscess and granuloma: Secondary | ICD-10-CM

## 2015-09-16 DIAGNOSIS — R202 Paresthesia of skin: Secondary | ICD-10-CM

## 2015-09-16 LAB — VITAMIN B6: VITAMIN B6: 73.8 ng/mL — AB (ref 2.1–21.7)

## 2015-09-16 NOTE — Procedures (Signed)
[facility]  [address], [address]  [place], [place] [address] Tel: [phone] Fax:  [phone] Test Date:  09/16/2015  Patient: Gregory [name] DOB: 1941-10-30 Physician: Narda Amber  Sex: Male Height: 5\' 10"  Ref Phys: Narda Amber, D.O.  ID#: [id number] Temp: 33.4C Technician: Jerilynn Mages. Dean   Patient Complaints: This is a 74 year old gentleman with subacute onset of bilateral feet paresthesias following antibody treatment for brain abscess.   NCV & EMG Findings: Extensive electrodiagnostic testing of the right lower extremity and additional studies of the left shows:  1. Bilateral sural and left superficial peroneal sensory responses are absent. Right superficial peroneal sensory response is reduced in amplitude. 2. Bilateral peroneal and tibial motor responses are within normal limits. 3. Bilateral H and F reflex studies are within normal limits. 4. Chronic motor axon loss changes are seen affecting the tibialis anterior and flexor digitorum longus muscles bilaterally.  In the right lower extremity, neurogenic changes are also present in the rectus femoris and abductor longus muscles. There is no evidence of active denervation.  Impression: 1. The electrophysiologic findings are most consistent with a generalized sensorimotor polyneuropathy, axon loss in type, affecting the lower extremities. Overall, these findings are severe in degree electrically with respect to sensory responses. 2. 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   ___________________________ Narda Amber, DO    Nerve Conduction Studies Anti Sensory Summary Table   Stim Site NR Peak (ms) Norm Peak (ms) P-T Amp (V) Norm P-T Amp  Left Sup Peroneal Anti Sensory (Ant Lat Mall)  12 cm NR  <4.6  >3  Right Sup Peroneal Anti Sensory (Ant Lat Mall)  12 cm    3.2 <4.6 2.3 >3  Left Sural Anti Sensory (Lat Mall)  Calf NR  <4.6  >3  Right  Sural Anti Sensory (Lat Mall)  Calf NR  <4.6  >3   Motor Summary Table   Stim Site NR Onset (ms) Norm Onset (ms) O-P Amp (mV) Norm O-P Amp Site1 Site2 Delta-0 (ms) Dist (cm) Vel (m/s) Norm Vel (m/s)  Left Peroneal Motor (Ext Dig Brev)  Ankle    3.8 <6.0 3.1 >2.5 B Fib Ankle 7.6 34.0 45 >40  B Fib    11.4  2.8  Poplt B Fib 2.4 10.0 42 >40  Poplt    13.8  2.8         Right Peroneal Motor (Ext Dig Brev)  Ankle    4.1 <6.0 3.4 >2.5 B Fib Ankle 7.8 33.0 42 >40  B Fib    11.9  2.8  Poplt B Fib 2.4 10.0 42 >40  Poplt    14.3  2.8         Left Tibial Motor (Abd Hall Brev)  Ankle    3.4 <6.0 8.3 >4 Knee Ankle 9.4 40.0 43 >40  Knee    12.8  3.7         Right Tibial Motor (Abd Hall Brev)  Ankle    3.0 <6.0 8.3 >4 Knee Ankle 10.4 43.0 41 >40  Knee    13.4  5.7          F Wave Studies   NR F-Lat (ms) Lat Norm (ms) L-R F-Lat (ms)  Left Tibial (Mrkrs) (Abd Hallucis)     54.13 <55 0.00  Right Tibial (Mrkrs) (Abd Hallucis)     54.13 <55 0.00   H Reflex Studies   NR H-Lat (ms) Lat Norm (ms)  L-R H-Lat (ms)  Left Tibial (Gastroc)     34.42 <35 2.99  Right Tibial (Gastroc)     31.43 <35 2.99   EMG   Side Muscle Ins Act Fibs Psw Fasc Number Recrt Dur Dur. Amp Amp. Poly Poly. Comment  Right AntTibialis Nml Nml Nml Nml 1- Mod-R Few 1+ Few 1+ Nml Nml N/A  Right Gastroc Nml Nml Nml Nml Nml Nml Nml Nml Nml Nml Nml Nml N/A  Right Flex Dig Long Nml Nml Nml Nml 1- Mod-R Few 1+ Few 1+ Nml Nml N/A  Right RectFemoris Nml Nml Nml Nml 1- Rapid Some 1+ Some 1+ Nml Nml N/A  Right GluteusMed Nml Nml Nml Nml Nml Nml Nml Nml Nml Nml Nml Nml N/A  Left AntTibialis Nml Nml Nml Nml 1- Mod-V Few 1+ Few 1+ Nml Nml N/A  Left Gastroc Nml Nml Nml Nml Nml Nml Nml Nml Nml Nml Nml Nml N/A  Left Flex Dig Long Nml Nml Nml Nml 1- Mod-R Few 1+ Few 1+ Nml Nml N/A  Left RectFemoris Nml Nml Nml Nml Nml Nml Nml Nml Nml Nml Nml Nml N/A  Left GluteusMed Nml Nml Nml Nml Nml Nml Nml Nml Nml Nml Nml Nml N/A  Right AdductorLong Nml  Nml Nml Nml 1- Rapid Some 1+ Some 1+ Nml Nml N/A      Waveforms:

## 2015-09-19 LAB — MYASTHENIA GRAVIS PANEL 2
ACETYLCHOLINE REC MOD AB: 14 %{inhibition}
Acetylcholine Rec Binding: <0.3 nmol/L
Aceytlcholine Rec Bloc Ab: <15 % of inhibition (ref <15)

## 2015-10-02 ENCOUNTER — Ambulatory Visit (INDEPENDENT_AMBULATORY_CARE_PROVIDER_SITE_OTHER): Payer: Medicare Other | Admitting: Pulmonary Disease

## 2015-10-02 ENCOUNTER — Encounter: Payer: Self-pay | Admitting: Pulmonary Disease

## 2015-10-02 VITALS — BP 122/62 | HR 58 | Temp 97.5°F | Wt 171.0 lb

## 2015-10-02 DIAGNOSIS — N401 Enlarged prostate with lower urinary tract symptoms: Secondary | ICD-10-CM

## 2015-10-02 DIAGNOSIS — G4700 Insomnia, unspecified: Secondary | ICD-10-CM

## 2015-10-02 DIAGNOSIS — F411 Generalized anxiety disorder: Secondary | ICD-10-CM

## 2015-10-02 DIAGNOSIS — G060 Intracranial abscess and granuloma: Secondary | ICD-10-CM

## 2015-10-02 DIAGNOSIS — I780 Hereditary hemorrhagic telangiectasia: Secondary | ICD-10-CM

## 2015-10-02 DIAGNOSIS — D126 Benign neoplasm of colon, unspecified: Secondary | ICD-10-CM

## 2015-10-02 DIAGNOSIS — J3489 Other specified disorders of nose and nasal sinuses: Secondary | ICD-10-CM

## 2015-10-02 DIAGNOSIS — E7800 Pure hypercholesterolemia, unspecified: Secondary | ICD-10-CM | POA: Diagnosis not present

## 2015-10-02 DIAGNOSIS — Z23 Encounter for immunization: Secondary | ICD-10-CM | POA: Diagnosis not present

## 2015-10-02 DIAGNOSIS — E039 Hypothyroidism, unspecified: Secondary | ICD-10-CM

## 2015-10-02 DIAGNOSIS — I483 Typical atrial flutter: Secondary | ICD-10-CM

## 2015-10-02 DIAGNOSIS — K219 Gastro-esophageal reflux disease without esophagitis: Secondary | ICD-10-CM

## 2015-10-02 DIAGNOSIS — N138 Other obstructive and reflux uropathy: Secondary | ICD-10-CM

## 2015-10-02 DIAGNOSIS — M159 Polyosteoarthritis, unspecified: Secondary | ICD-10-CM

## 2015-10-02 DIAGNOSIS — M150 Primary generalized (osteo)arthritis: Secondary | ICD-10-CM

## 2015-10-02 MED ORDER — METOPROLOL SUCCINATE ER 25 MG PO TB24
25.0000 mg | ORAL_TABLET | Freq: Every day | ORAL | Status: DC
Start: 2015-10-02 — End: 2016-01-01

## 2015-10-02 MED ORDER — CILIDINIUM-CHLORDIAZEPOXIDE 2.5-5 MG PO CAPS: 1.0000 | ORAL_CAPSULE | Freq: Three times a day (TID) | ORAL | Status: DC | PRN

## 2015-10-02 MED ORDER — PANTOPRAZOLE SODIUM 40 MG PO TBEC: 40.0000 mg | DELAYED_RELEASE_TABLET | Freq: Every day | ORAL | Status: DC

## 2015-10-02 MED ORDER — LEVOTHYROXINE SODIUM 125 MCG PO TABS: ORAL_TABLET | ORAL | Status: DC

## 2015-10-02 MED ORDER — MECLIZINE HCL 25 MG PO TABS: 25.0000 mg | ORAL_TABLET | Freq: Every day | ORAL | Status: DC | PRN

## 2015-10-02 NOTE — Patient Instructions (Addendum)
Today we updated your med list in our EPIC system...    Continue your current medications the same...   we wrote a new prescription for AMOXICILLIN to take 2G (4 tabs) about 1H prior to the dental work...  We gave you the 2016 FLU vaccine today...   Call for any questions...  Let's plan a follow up visit in 3-48mo, sooner if needed for problems.Marland KitchenMarland Kitchen

## 2015-10-11 ENCOUNTER — Other Ambulatory Visit: Payer: Self-pay | Admitting: Pulmonary Disease

## 2015-12-14 ENCOUNTER — Other Ambulatory Visit: Payer: Self-pay | Admitting: Physician Assistant

## 2016-01-01 ENCOUNTER — Other Ambulatory Visit (INDEPENDENT_AMBULATORY_CARE_PROVIDER_SITE_OTHER): Payer: Medicare Other

## 2016-01-01 ENCOUNTER — Telehealth: Payer: Self-pay | Admitting: *Deleted

## 2016-01-01 ENCOUNTER — Encounter: Payer: Self-pay | Admitting: Pulmonary Disease

## 2016-01-01 ENCOUNTER — Ambulatory Visit (INDEPENDENT_AMBULATORY_CARE_PROVIDER_SITE_OTHER): Payer: Medicare Other | Admitting: Pulmonary Disease

## 2016-01-01 VITALS — BP 110/68 | HR 73 | Temp 97.5°F | Ht 70.0 in | Wt 178.8 lb

## 2016-01-01 DIAGNOSIS — D126 Benign neoplasm of colon, unspecified: Secondary | ICD-10-CM

## 2016-01-01 DIAGNOSIS — I483 Typical atrial flutter: Secondary | ICD-10-CM

## 2016-01-01 DIAGNOSIS — E7800 Pure hypercholesterolemia, unspecified: Secondary | ICD-10-CM

## 2016-01-01 DIAGNOSIS — I780 Hereditary hemorrhagic telangiectasia: Secondary | ICD-10-CM | POA: Diagnosis not present

## 2016-01-01 DIAGNOSIS — I6389 Other cerebral infarction: Secondary | ICD-10-CM

## 2016-01-01 DIAGNOSIS — E039 Hypothyroidism, unspecified: Secondary | ICD-10-CM | POA: Diagnosis not present

## 2016-01-01 DIAGNOSIS — M150 Primary generalized (osteo)arthritis: Secondary | ICD-10-CM

## 2016-01-01 DIAGNOSIS — J3489 Other specified disorders of nose and nasal sinuses: Secondary | ICD-10-CM | POA: Diagnosis not present

## 2016-01-01 DIAGNOSIS — M159 Polyosteoarthritis, unspecified: Secondary | ICD-10-CM

## 2016-01-01 DIAGNOSIS — K219 Gastro-esophageal reflux disease without esophagitis: Secondary | ICD-10-CM

## 2016-01-01 DIAGNOSIS — I638 Other cerebral infarction: Secondary | ICD-10-CM

## 2016-01-01 DIAGNOSIS — F411 Generalized anxiety disorder: Secondary | ICD-10-CM

## 2016-01-01 DIAGNOSIS — N401 Enlarged prostate with lower urinary tract symptoms: Secondary | ICD-10-CM

## 2016-01-01 DIAGNOSIS — G620 Drug-induced polyneuropathy: Secondary | ICD-10-CM

## 2016-01-01 DIAGNOSIS — N138 Other obstructive and reflux uropathy: Secondary | ICD-10-CM

## 2016-01-01 DIAGNOSIS — G629 Polyneuropathy, unspecified: Secondary | ICD-10-CM | POA: Insufficient documentation

## 2016-01-01 DIAGNOSIS — G060 Intracranial abscess and granuloma: Secondary | ICD-10-CM

## 2016-01-01 LAB — BASIC METABOLIC PANEL
BUN: 15 mg/dL (ref 6–23)
CALCIUM: 9.7 mg/dL (ref 8.4–10.5)
CO2: 31 meq/L (ref 19–32)
Chloride: 102 mEq/L (ref 96–112)
Creatinine, Ser: 1.07 mg/dL (ref 0.40–1.50)
GFR: 71.96 mL/min (ref >60.00)
GLUCOSE: 88 mg/dL (ref 70–99)
POTASSIUM: 4.6 meq/L (ref 3.5–5.1)
SODIUM: 138 meq/L (ref 135–145)

## 2016-01-01 MED ORDER — SILODOSIN 8 MG PO CAPS: 8.0000 mg | ORAL_CAPSULE | Freq: Every day | ORAL | Status: DC

## 2016-01-01 MED ORDER — LEVOTHYROXINE SODIUM 125 MCG PO TABS: ORAL_TABLET | ORAL | Status: DC

## 2016-01-01 MED ORDER — PANTOPRAZOLE SODIUM 40 MG PO TBEC: 40.0000 mg | DELAYED_RELEASE_TABLET | Freq: Every day | ORAL | Status: DC

## 2016-01-01 MED ORDER — CILIDINIUM-CHLORDIAZEPOXIDE 2.5-5 MG PO CAPS: 1.0000 | ORAL_CAPSULE | Freq: Three times a day (TID) | ORAL | Status: DC | PRN

## 2016-01-01 MED ORDER — FIRST-DUKES MOUTHWASH MT SUSP: OROMUCOSAL | Status: DC

## 2016-01-01 MED ORDER — METOPROLOL SUCCINATE ER 25 MG PO TB24: 25.0000 mg | ORAL_TABLET | Freq: Every day | ORAL | Status: DC

## 2016-01-01 NOTE — Telephone Encounter (Signed)
Patient called and advised he is scheduled to have a Brain scan of Tuesday 01/05/15 and wants to know if Dr Megan Salon thinks it is necessary. He also wants to know what condition he was being treated for so that he can tell his dentist prior to procedures he has coming up. Gave Dr Orlando Penner at [facility] [phone]. Advised will send the doctor a message and call once he responds.

## 2016-01-01 NOTE — Patient Instructions (Signed)
Today we updated your med list in our EPIC system...    Continue your current medications the same...  We refilled your meds per request...  Today we checked your metabolic panel... We will request a follow up CT Brain scan to check on the status of your prev brain abscess...    We will contact you w/ the results when available...   Call for any questions...  Let's plan a follow up visit in 4-92mo, sooner if needed for problems.Marland KitchenMarland Kitchen

## 2016-01-01 NOTE — Progress Notes (Signed)
Subjective:    Patient ID: Gregory [name], male    DOB: [birth year], 75 y.o.   MRN: [id number]  HPI 75 y/o WM here for a follow up visit... he has multiple medical problems including:  Hx OWR syndrome;  Hx atypCP;  Hyperchol;  Hypothyroidism;  GERD/ Divertics/ IBS/ Colon polyps;  BPH/ BOO;  DJD/ LBP/ Plantar fasciitis;  Hx dizziness;  Anxiety & chronic persistant insomnia... 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  LABS 10/14:  FLP- on diet alone looks pretty good w/ LDL=111...  EKG 10/14 showed SBrady, rate57, otherw wnl;  2DEcho 10/14 showed normal LV size & wall thickness, norm LVF w/ EF=55-60%, Gr1DD, mildly thickened AoV leaflets, mild MR;  ETT 11/14 showed 42mn on treadmill, hypertensive BP response, rare PVC, no ischemia, O2 sats dropped to 87% w/ exercise...  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 Given this mild airflow obstruction we will start Rx w/ Dulera100- 2spBid   CXR 3/15 showed normal heart size, clear lungs, mild apical scarring, right basilar density c/w AVM based on CT report 2004, NAD..Marland KitchenMarland Kitchen EKG 3/15 showed NSR, rate87, low volt, NAD...  ADDENDUM> LABS 4/15 showed>  FLP- TChol 137, TG 30, HDL 36, LDL 95;  Chems- wnl;  CBC- anemic w/ Hg=11.3 & MCV=77;  TSH=9.26...   Ret for Iron panel (Fe=16- 4%sat), Ferritin (6.4), Stool cards (pending); then start FeSO4-3275mID w/ VitC500... Recheck CBC, Iron level in 6-8weeks...   Taking Synthroid112 regularly, therefore increase back to 1254md... Recheck TSH in 58mo70moe never returned.  ~  October 08, 2014:  47mo 45mo& Gregory iMikki Santeetill working but  has cut back to 2d/wk; feeling better overall and most of his dyspnea symptoms resolved w/ FeSO4 7 return of his Hg to normal; energy is good & he exercises on treadmill, doing better on stairs, etc; he stopped the [facility] LPis breathing improved back to baseline...  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    Hx CP>  Prev cardiac w/u by DrCreHilary Hertzneg, no recurrent CP complaints, he remains active but notes DOE w/ stairs, ok on level ground, 2DEcho & GXT were OK x O2 sat drop to 87% at peak exercise- ?incr shunting thru his RLL AVM...    Chol> Intol to Simva40 & Cres5 due to musc cramps; FLP 4/15 on diet alone showed TChol 137, TG 30, HDL 36, LDL 95...    Hypothy>  On Synthroid 125mcg69mow>  Labs 10/15 revealed TSH= 5.10 & clinically euthyroid, continue same...Marland KitchenMarland Kitchen  GI- GERD, IBS, Polyps> on Prilosec40Qd & Zofran/ Librax prn; followed by [facility] & seen 6/15> note reviewed (heme pos stool & anemia from epistaxis & NSAIDs); he had colonoscopy 3/12- neg x for hems & DrM rec f/u 89yr...    GU>  Hx BPH w/ BOO on Rapaflo 8358md per DrEskridge; last seen 1/15 & doing satis w/ PSA= 2.34    Ortho> on Mobic7.5 prn + OTC meds; eval 12/11 by DrNorris for bilat leg pain & cramping, ?min atrophy of left gastroc- he rec tonic water & neurology eval> he saw DrWillis 3/12 (note reviewed)> NCV was normal; EMG c/w mild S1 radiculopathy; he rec MRI- pt decided against the MRI (we did not get f/u note)...    Anxiety/ Insomnia>  He has Chlorazepate7.5 for Prn  use, and Ambien10 to use for sleep Qhs... We reviewed prob list, meds, xrays and labs> see below for updates >> OK 2015 Flu shot today...   LABS 10/15:  CBC- wnl w/ Hg= 16.5, Fe=88 (23%sat), Ferritin=36;  TSH=5.10... REC>  Continue Iron supplement daily, and the Synthroid125/d...   ~  April 01, 2015:  58m83moV & Gregory reports a good interval>  He has notes some intermittent & persistent nose bleeds due to his OWR & nasal obstruction, he uses nasal saline & will f/u w/ ENT- DrRosen... He also notes some discomfort in knees, hips, back but he can't take strong NSAIDs due to OWR & risk of GI bleeds; we discussed trial of Tramadol50 w/ tylenol to see how this works on a prn basis... His 3rd complaint is his chronic persistent insomnia- only sleeping 4h per night & difficulty getting back to sleep; we reviewed a few strategies to deal w/ this- he notes that Tranxene taken when he arises will help him go back to sleep 7 no hangover- this is a win-win strategy & he will continue... 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    Hx CP>  Prev cardiac w/u by DrCHilary Hertzs neg, no recurrent CP- he remains active but notes DOE w/ stairs, ok on level ground, 2DEcho & GXT were OK x O2 sat drop to 87% at peak exercise- ?incr shunting thru his RLL AVM...    Chol> Intol to Simva40 & Cres5 due to musc cramps; FLP 4/16 on diet alone showed TChol 146, TG 85, HDL 35, LDL 94...    Hypothy>  On Synthroid 125m62m now>  Labs 4/16 revealed TSH= 4.38 &  clinically euthyroid, continue same...    GI- GERD, IBS, Polyps> on Prilosec40Qd & Zofran/ Librax prn; [facility]> note reviewed (heme pos stool & anemia from epistaxis & NSAIDs); he had colonoscopy 3/12- neg x for hems & DrM rec f/u 65yrs22yr   GU>  Hx BPH w/ BOO on Rapaflo 8mg/d91mr DrEskridge; last seen 1/16 & doing satis & pt reports that his PSA was OK...    Ortho> on OTC meds prn; eval 12/11 by DrNorris for bilat leg pain & cramping, ?  min atrophy of left gastroc- he rec tonic water & neurology eval> he saw DrWillis 3/12 (note reviewed)> NCV was normal; EMG c/w mild S1 radiculopathy; he rec MRI- pt decided against the MRI;  He reports some discomfort in knees, hips, back & we decided on trial of Tramadol50...    Anxiety/ Insomnia>  He has Chlorazepate7.5 for Prn use, and Ambien10 to use for sleep Qhs... We reviewed prob list, meds, xrays and labs> see below for updates >>   CXR 4/16 showed norm heart size, clear lungs w/ some hyperinflation, RLL AVM noted w/o change...  EKG 4/16 revealed poor tracing- NSR, rate68, minor NSSTTWA, NAD...   LABS 4/16:  FLP- at goals on diet alone;  Chems- wnl;  CBC- wnl;  TSH=4.38...  ~  June 04, 2015:  30moROV & post hosp check>  BMikki Santeewas hosp 5/24 - 05/21/15 by [facility] after presenting w/ HA & right leg weakness; he tells me he saw his dentist for routine cleaning 2wks prior to this; eval revealed a brain lesion in left basal ganglionic region (tumor vs abscess); he had brain stereotactic bx 5/27 by DrNundkumar (left frontal craniectomy) & thick pus was removed- cultures +peptostreptococcus species (otherw neg aerobic bact cult, AFB, Fungi); he was seen by ID & placed on Rochepin, Vanco, Flagyl to be continued 194moost disch (they are checking levels and adjusting dose); also started on Keppra500Bid per NS;  Since disch he is feeling better- no f/c/s, notes sl dry cough/ no sput, & the cough is keeping him awake- TUSSIONEX called in & helps he says;   Sl nausea relieved by phenergan prn...  EXAM reveals Afeb, VSS, O2sat=94% on RA at rest;  HEENT- OWR telangiectasias w/o change;  Chest- few basilar crackles w/o wheezing, rhonchi, consolidation;  Heart- RR w/o m/r/g;  Abd- neg;  Ext- neg w/o c/c/e...  CT Head 5/16 showed 2 x 3 cm LEFT basal ganglia mass, likely tumor vs abscess, moderately extensive surrounding edema, small surgical clips within or near the posterior wall LEFT maxillary sinus/LEFT pterygopalatine fossa, in this patient with history of nose bleed (these are stable from prior scans)...  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   EKG 5/16 showed NSR, rate68, wnl, NAD...Marland KitchenMarland KitchenVen Dopplers 6/16 were neg for DVT  LABS- reviewed> only pos cult= peptostreptococcus from brain bx;  Chems- ok x Na & Ca sl low;  CBC- ok x WBC 14=>11... 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 Given IS, refill Phenergan & Tussionex...  ~  July 31, 2015:  17mo34mo & add-on appt requested for several complaints>  Gregory [name] finished his antibiotic Rx for brain abscess (likely due to dental work several weeks prior to onset of neuro symptoms in [id number] DrCampbell has arranged for a follow up CT Brain 08/11/15 (this will be 3 wks off antibiotics);  He had an  episode of tachycardia (?AFlutter  w/ rvr per EMS) w/ HR incr to 170's- went to ER 7/13 (but was in NSR) & he is currently wearing a Holter monitor per DrCrenshaw w/ follow-up Cards appt sched for next week...        He presents today w/ several complaints>  1) hoarseness: note this is intermittent, off & on, swallowing OK, denies indigestion/ reflux/ etc; he is on Protonix40 w/ his hx of OWR, plus zofran/ librax; we reviewed antireflux regimen & agreed to refer him to ENT for a look at his larynx & their opinion.Marland KitchenMarland Kitchen  2) c/o numbness in toes and bottom of his feet bilat for the last week or so w/ some discomfort while walking; no culprit meds on his list & we offered referral to Neuro for a neuropathy evaluation.Marland KitchenMarland Kitchen  3) he is c/o weight loss> weight today is 167#, 5'10" Tall, BMI=24 which is wnl; last wt=182# 75moago for a 15# wt loss, appetite is good & intake unchanged he says; we discussed caloric intake and nutritional supplements; we also checked labs> Chems- wnl, BS=103, A1c=5.7;  CBC- wnl; Note- he is on Synthroid1236m/d and TSH= 3.28 when checked 07/03/15...   CXR 07/02/15 showed norm heart size, clear lungs, NAD...   CT Head 08/11/15 showed left frontal craniotomy, resolved abscess in left external capsule w/ decr edema and decr enhancement...  2DEcho 08/14/15 showed norm LV size & function w/ EF=55-60%, no regional wall motion abn, Gr2DD, trace MR & TR  ~  October 02, 2015:  69m69moV & BobMikki Santees gained 4# up to #171# today on Ensure 1can/d:  We discussed the need for AMOXICILLIN 2gm 1H prior to dentist...    He saw ENT (we do not have note) & pt indicates that hisw cords were sl thinned, they offered injection rx but his hoarseness is improved & holding off...     He saw DrCampbell, ID on 8/1> f/u anaerobic brain abscess after dental work & presented w/ right sided weakness (resolved); he finished 69mo54moLevaquin & Flagyl, no f/c/s, neuro exam was WNL, & f/u scan 8/22 w/ resolved abscess; pt tells me DrCampbell signed off...     He saw  DrCrenshaw 8/18 for Cards> f/u PAFlutter episode after disch from hosp & converted spont, they did 30d Holter- all NSR/ STachy, on ToprolXL25; they rechecked his 2DEcho- normal valves, Gr2DD...  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 Overall, these findings are severe in degree electrically with respect to sensory responses. 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  We reviewed prob list, meds, xrays and labs> NOTE: THEY WANT ANOTHER CT HEAD IN [id number]          Problem List:   BRAIN ABSCESS  >>  Gregory [name] hosp 5/24 - 05/21/15 by [facility] after presenting w/ HA & right leg weakness; he tells me he saw his dentist for routine cleaning 2wks prior to this; eval revealed a brain lesion in left basal ganglionic region (tumor vs abscess); he had brain stereotactic bx 5/27 by DrNundkumar (left frontal  craniectomy) & thick pus was removed- cultures +peptostreptococcus species (otherw neg aerobic bact cult, AFB, Fungi); he was seen by ID & placed on Rochepin, Vanco, Flagyl to be continued 69mopost disch (they are checking levels and adjusting dose); also started on Keppra500Bid per NS... ~  6/16:  Since disch he is feeling better- no f/c/s, notes sl dry cough/ no sput, & the cough is keeping him awake- TUSSIONEX called in & helps he says;  Sl nausea relieved by phenergan prn...  ~  8/16:  He was checked by DrCampbell, ID- they stopped antibiotics and rec f/u CT Brain 08/11/15= 3 wks off the antibiotic rx => resolved  abscess.  OTHER DISEASES OF NASAL CAVITY AND SINUSES - Hx of Osler-Weber-Rendue syndrome/ hereditary telangiectasias... he's had numerous nose bleeds and surgery by DEtter Sjogren now followed by DBarbaraann Faster.. left nares is occluded w/ skin graft- no lumen... this is quite uncomfortable to the patient but he's been told there is nothing further that can be done for this... ~  3/11:  notes occas nosebleeds but he is able to handle them... ~  4/12:  No diff in his pattern of mild nose bleeds & he denies any severe epistaxis episodes... 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 ~  4/14:  He notes infreq bleeding now w/ saline etc... 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 Dyspnea w/ O2 desat w/ exercise >>  ~  12/14:  PFTs showed FVC=3.98 (89%), FEV1=2.72 (79%), %1sec=68, and mid-flows= 51% predicted; c/w mild obstructive dis & we decided on a trial of Dulera100-2spBid ~  4/15:  His breathing is [facility]/ [facility]... ~  10/15:  His breathing is back to baseline after FeSO4 rx for anemia & ret of Hg to normal; [facility]& has it handy for prn use... ~  4/16:  prev on Dulera100-2spBid, now just prn; breathing improved w/ exercise; O2 sat= 93% on RA today; c/o dry throat & encouraged to use lozenges etc... ~  CXR 4/16 showed norm heart size, clear lungs w/ some hyperinflation, RLL AVM  noted w/o change... 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 known GI telangiectasia as well without signif GI bleeding in the past- [facility] he's also has [facility]ctasis [facility]/ [facility].. extensive nasal problems as above... ~  labs 2/09 showed Hg= 17.0 ~  labs 2/10 showed Hg= 16.4 ~  CXR 3/11 is chr incr markings esp RLL- no change, NAD..Marland KitchenMarland Kitchen ~  Labs 6/11 showed Hg= 15.9 ~  Labs 4/12 showed Hg= 16.0 ~  CXR 10/12 showed mild biapical scarring, RLL AVM, NAD... ~  Labs 4/13 showed Hg= 15.2 ~  CXR 8/13 showed heart at upper lim of norm, increased markings & apical scarring, chr incr markings in RLL- AVM, NAD.[name]. 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 ~  CXR 3/15 showed normal heart size, clear lungs, mild apical scarring, right basilar density c/w AVM based on CT report 2004, NAD.[name]. ~  CXR 4/16 showed norm heart size, clear lungs w/ some hyperinflation, RLL AVM noted w/o change... 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  Hx of CHEST PAIN (ICD-786.50) - seen Sep09 w/ several recent bouts of CP while working in yard, washing car, etc... resolved w/ rest & Protonix... EKG showed WNL, [facility] (no ischemia or infarction and EF= 61%)... Cardiac eval DrCrenshaw w/ 2DEcho 10/09  showing norm LV- no regional wall motion abn, norm LVF w/ EF= 60%, mild dil RA/ RV;  and norm MYOVIEW (no ischemia or infarction, EF= 58%)... ~  10/14: repeat cardiac eval by DrCrenshaw due to [place] w/ stairs; EKG 10/14 showed SBrady, rate57, otherw wnl;  2DEcho 10/14 showed normal LV size & wall thickness, norm LVF w/ EF=55-60%, Gr1DD, mildly thickened AoV leaflets, mild MR;  ETT 11/14 showed 65mn on treadmill, hypertensive BP response, rare PVC, no ischemia, O2 sats dropped to 87% w/ exercise ~  EKG 3/15 showed NSR, rate87, low volt, NAD..Marland KitchenMarland Kitchen  Episode of AFLUTTER >> this occurred 06/2015 & notes from Cards ([facility]& SRichardson Doppare reviewed); placed on lose dose TOPROL-XL & titrated up to '25mg'$ /d... Holter monitor placed and results pending...   HYPERCHOLESTEROLEMIA, MILD (ICD-272.0) - now on CRESTOR '5mg'$ /d>  Prev Rx w/ Simva40 but developed muscle cramping which resolved off the med. ~  FLP 6/08 showed TChol 208, TG 75, HDL 34, LDL 139... he preferred diet Rx- ~  FLP 2/09 showed TChol 172, TG 69, HDL 30, LDL 129... he agreed to CDenair.. ~  FKurten8/09 on Crestor10 showed TChol 91, TG 49, HDL 28, LDL 54... rec- keep same. ~  FLP 2/10 on Crestor10 showed TChol 108, TG 56, HDL 31, LDL 66 ~  9/10:  we discussed changing Crestor10 to Simvastatin40 for $$$ reasons... ~  FMap[place]3/11 on Simva40 showed TChol 105, TG 59, HDL 39, LDL 54... continue same. ~  FWhite [place]4/12 on Simva40 showed TChol 104, TG 62, HDL 29, LDL 63... C/o severe muscle cramps & wants to stop for 164mocramping resolved! ~  Referred to [facility] & they restarted his Crestor at '5mg'$ /d> tol well so far... ~  FLP 10/12 on Cres5 showed TChol 114, TG 40, HDL 36, LDL 70 ~  FLP 2/13 on Cres5 showed TChol 124, TG 21, HDL 40, LDL 80 ~  FLP 4/14 on Cres5 showed TChol 119, TG 44, HDL 38, LDL 72  ~  6/14: pt stopped Cres5 on his own due to leg cramps & decided to hold statins for now w/ repeat FLP off meds in several months... ~  FLP 10/14 on diet  alone showed TChol 155, TG 29, HDL 38, LDL 111  ~  FLP 4/15 on diet alone showed TChol 137, TG 30, HDL 36, LDL 95 ~  FLP 4/16 on diet alone showed TChol 146, TG 85, HDL 35, LDL 94  HYPOTHYROIDISM - on SYNTHROID 160mg/d now; prev 1221m/d dose was decreased 4/12... ~  labs 2/09 showed TSH= 0.69...  8/09 showed TSH= 0.44... ~  labs 2/10 showed TSH= 0.68 ~  labs 3/11 showed TSH= 0.52 ~  Labs 4/12 on Synthroid125 showed TSH= 0.33... We decided to decr the dose to 112/d. ~  Labs 4/13 on Levo112 showed TSH= 4.60 ~  Labs 4/14 on Levo112 showed TSH= 5.46 ~  Labs 4/15 on Levo112 showed TSH= 9.26 and we decided to incr dose back to 12560md... ~  Labs 4/16 on Levo125 showed TSH= 4.38  GERD, IRRITABLE BOWEL SYNDROME, COLONIC POLYPS - [facility] (he tried OTC Zantac '75mg'$  on his own)... also takes  LIB[place]  ~  colonoscopy 1/04 w/ divertics, diminutive rectal polyp (adenomatous), hems...  ~  f/u colon 1/07 showed no recurrent polyps... ~  [facility]  HYPERTROPHY PROSTATE W/UR OBST & OTH LUTS (ICD-600.01) - followed by DrRDDJSHFWYORAPAFLO '8mg'$ /d (stopped Flomax due to side effect- ED)... doing well without LTOS, just complains of nocturia x 1-2 ~  labs 2/09 showed PSA= 0.76 ~  labs 2/10 showed PSA= 0.90 ~  2011 PSA checked by DrDavis (pt states it was OK)... ~  Labs 4/12 showed PSA= 1.22 ~  Labs 4/13 showed PSA= 1.31 ~  10/13: presents c/o incr freq, nocturia x2-4, & weak stream despite Rapaflo; he is intol to Flomax; refer to Urology for further eval=> seen by DrEskridge, no changes made... ~  1/15: f/u by Urology, DrEskridge> BPH, LUTS, hypersens unstable bladder; on Rapaflo8 & doing satis, PSA reported to be 2.34...  LEFT ARM & SHOULDER PAIN >> he was checked by GborLacie Scotts not improving he says; [facility]> [facility] reviewed.  BACK PAIN, LUMBAR  (ICD-724.2) - he's had therapy in the past and not currently bothered by LBP... ~  9/10:  requesting Rx for Robaxin for muscle spasm as needed.  PLANTAR FASCIITIS (ICD-728.71) - c/o pain in his feet secondary to plantar fasciitis eval & [facility]/ [facility] & shots... ~  9/10: we discussed [facility] to [facility]ymptoms improved.  Hx NOCTURNAL LEG CRAMPS >>  ~  This initially occurred several yrs ago while on Simvastatin & the cramps resolved off this med; he was subseq placed on Cres5 & tol well... ~  6/14: he indicates that noct leg cramps returned x1wk & he stopped the Cres5 w/ improvement; he wants to leave off the statins for now; rec to try tonic water/ yellow mustard prn...  Hx of DIZZINESS (ICD-780.4) - sudden episode dizziness Sep09 working at [employer] 1PM [employer]room spinning- lasted 10mi25md resolved spontaneously, felt light headed afterwards... no LOC, syncope, seizure activity, etc... no CP/ palpit/ SOB/ etc... there were several EMTs in the restaurant and they checked him out- stable and transported to ER... 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 trMarland Kitchenated w/ MECLIZINE w/ improvement.  ANXIETY (ICD-300.00) & INSOMNIA, CHRONIC (ICD-307.42) - he uses CHLORAZEPATE 7.'5mg'$   Prn & AMBIEN73m/Qhs for chronic persistant insomnia...  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  ANEMIA >>  ~  LABS 4/13 showed Hg= 15.2, MCV=91 ~  LABS 4/14 showed Hg= 14.5, MCV=84 ~  LABS 3/15 showed Hg= 13.6, MCV=78 ~  LABS 4/15 showed Hg= 11.3, MCV= 77, Iron studies showed Fe= 16 (3.6%sat), Ferritin= 6.4, & stool cards neg; Rx w/ FeSO4... ~  Labs 10/15 showed Hg= 16.5, MCV= 91, Fe=88 (23%sat), Ferritin= 36...  ~  Labs 4/16 showed Hg= 16.0  Health Maintenance: ~  [facility]  [facility] [facility] up to [facility] on colon etc... (last 1/07 & 89yr/u due 1/12). ~  GU:  followed by DrRDavis in past; PSAs have all been wnl... ~  Immunizations:  he gets yearly Flu vaccinations in the fall;  OK Pneumovax 03/12/10 at age- [age]    Past Surgical History  Procedure Laterality Date  . Varicocelectomy  1991    Dr. RoTresa Endo. 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 Other surgical history      pulse laser for facial telangiectasias inthe past  . Excision left wrist ganglian/ myxoid cyst  05-30-2009  . Cardiovascular stress test  01-14-2003    NO ISCHEMIA / EF 61%/ NORMAL LE WALL MOTION  . Transthoracic echocardiogram  10-17-2008   DR CRENSHAW    NORMAL LVF/ EF 60%/  MILDLY DILATED RIGHT ATRIUM/ VENTRICULE  . 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 HaRenda Rolls. Brain biopsy Left 05/16/2015    Procedure: Stereotactic Left Brain Biopsy with Brain Lab;  Surgeon: NeConsuella LoseMD;  [facility] [facility];  Service: Neurosurgery;  Laterality: Left;  Stereotactic Left Brain biopsy with brainlab  . Application of cranial navigation N/A 05/16/2015    Procedure: APPLICATION OF CRANIAL NAVIGATION;  Surgeon: NeConsuella LoseMD;  [place] [facility];  Service: Neurosurgery;  Laterality: N/A;    Outpatient Encounter Prescriptions as of 10/02/2015  Medication Sig  . acetaminophen (TYLENOL) 325 MG tablet Take 650 mg by mouth daily as needed (pain).  . Ascorbic Acid (VITAMIN C) 1000 MG tablet Take 1,000 mg by mouth daily.   . Cholecalciferol (VITAMIN D3) 2000 UNITS TABS Take 2,000 Units by mouth daily.  . clidinium-chlordiazePOXIDE (LIBRAX) 5-2.5 MG capsule Take 1 capsule by mouth 3 (three) times daily as needed.  . clorazepate (TRANXENE) 7.5 MG tablet TAKE 1 TABLET BY MOUTH 3 TIMES A DAY AS NEEDED  . Diphenhyd-Hydrocort-Nystatin (FIRST-DUKES MOUTHWASH) SUSP 1 tsp gargle and swallow four times daily  . levothyroxine  (SYNTHROID, LEVOTHROID) 125 MCG tablet TAKE 1 TABLET (125 MCG TOTAL) BY MOUTH DAILY BEFORE BREAKFAST.  . Marland Kitchenoratadine (CLARITIN) 10 MG tablet Take 10 mg by mouth daily.  . Magnesium 250 MG TABS Take 250 mg by mouth at bedtime.   . meclizine (ANTIVERT) 25 MG tablet Take 1 tablet (25 mg total) by mouth daily as needed for dizziness.  . metoprolol succinate (TOPROL XL) 25 MG 24 hr tablet Take 1 tablet (25 mg total) by mouth daily.  . Multiple Vitamin (MULTIVITAMIN WITH MINERALS) TABS tablet Take 1 tablet by mouth daily. Centrum  . ondansetron (ZOFRAN) 4 MG tablet Take 1 tablet (4 mg total) by mouth every 8 (eight) hours as needed for nausea or vomiting.  .[name]  pantoprazole (PROTONIX) 40 MG tablet Take 1 tablet (40 mg total) by mouth daily.  [name] RAPAFLO 8 MG CAPS capsule TAKE ONE CAPSULE AT BEDTIME  . [DISCONTINUED] clidinium-chlordiazePOXIDE (LIBRAX) 5-2.5 MG per capsule Take 1 capsule by mouth 3 (three) times daily as needed.  . [DISCONTINUED] levothyroxine (SYNTHROID, LEVOTHROID) 125 MCG tablet TAKE 1 TABLET (125 MCG TOTAL) BY MOUTH DAILY BEFORE BREAKFAST.  . [DISCONTINUED] meclizine (ANTIVERT) 25 MG tablet Take 1 tablet (25 mg total) by mouth daily as needed for dizziness.  . [DISCONTINUED] metoprolol succinate (TOPROL XL) 25 MG 24 hr tablet Take 1 tablet (25 mg total) by mouth daily.  . [DISCONTINUED] pantoprazole (PROTONIX) 40 MG tablet Take 1 tablet (40 mg total) by mouth daily.   No facility-administered encounter medications on file as of 10/02/2015.    Allergies  Allergen Reactions  . Nsaids Other (See Comments)    Cannot take-per MD  . Other Other (See Comments)    All blood thinners-cannot take per MD  . Aspirin Other (See Comments)    nosebleeds  . Statins Other (See Comments)    Weakness, myalgias     Current Medications, Allergies, Past Medical History, Past Surgical History, Family History, and Social History were reviewed in [employer] record.    Review  of Systems       See HPI - other systems neg except as noted... The patient denies anorexia, fever, weight loss, weight gain, vision loss, decreased hearing, hoarseness, chest pain, syncope, dyspnea on exertion, peripheral edema, prolonged cough, headaches, hemoptysis, abdominal pain, melena, hematochezia, severe indigestion/heartburn, hematuria, incontinence, muscle weakness, suspicious skin lesions, transient blindness, difficulty walking, depression, unusual weight change, abnormal bleeding, enlarged lymph nodes, and angioedema.     Objective:   Physical Exam      WD, WN, 75 y/o WM in NAD... 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 GENERAL:  Alert & oriented; pleasant & cooperative... HEENT:  [place]/AT, EOM-wnl, PERRLA, EACs-clear, TMs-wnl, NOSE- occluded left nares from skin grafts, MOUTH- mucosal telangiectasias.  NECK:  Supple w/ fairROM; no JVD; normal carotid impulses w/o bruits; no thyromegaly or nodules palpated; no lymphadenopathy. CHEST:  Clear to P & A; without wheezes/ rales/ or rhonchi. HEART:  Regular Rhythm; without murmurs/ rubs/ or gallops. ABDOMEN:  Soft & nontender; normal bowel sounds; no organomegaly or masses detected. EXT: without deformities, mild arthritic changes; no varicose veins/ venous insuffic/ or edema. NEURO:  CN's intact; motor testing normal; sensory testing normal; gait normal & balance OK. DERM:  mult telangiectasias...  RADIOLOGY DATA:  Reviewed in the EPIC EMR & discussed w/ the patient...  LABORATORY DATA:  Reviewed in the EPIC EMR & discussed w/ the patient...   Assessment & Plan:    BRAIN ABSCESS >> [facility] 5/16, followed by ID, DrCampbell, finished antibiotic rx 07/2015 and f/u CT Brain=> resolved abscess... He will need antibiotic prophylaxis prior to future dental work...  Hx Cough, Dyspnea on exertion, mild COPD >> as above, prev on Dulera100- 2sp Bid & exercise program- improved; we will continue to monitor his status & O2 sats; Dyspnea  improved w/ Fe for anemia & ret of Hg to norm... He has Tussionex prn cough... 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 CXR w/o change in the RLL AVM but this could certainly be  an issue w/ his O2 desat w/ exercise;  Also may have played a roll in brain abscess after dental work 5/16=> I would rec Augmentin prophylaxis in future...     Hx CP, AFlutter>  Prev cardiac w/u by DrCrenshaw was neg, no recurrent CP complaints, but he notes DOE w/ stairs & DrCrenshaw repeated 2DEcho & ETT as above... Episode of PAFlutter 06/2015=> holter monitor per cards- pending & pt improved on ToprolXL25...      Chol>  Off statins due to recurrent cramps, on diet alone & FLP is reasonable...     Hypothy>  On Synthroid 160mg/d now>  He is clinically & biochem euthyroid...     [facility]>  [facility]- neg x for hems & DrM rec f/u 551yr..     GU>  Hx BPH w/ BOO & followed by DrEskridge on Rapaflo [id number] & stable...     Ortho>  See above- prev stable on OTC analgesics as needed, but recent incr symptoms- try Tramadol50 prn...  LEG CRAMPS >> he is quite sure they are from his statin Rx & we decided to leave these off for now, try to control on diet/ exercise & continued monitoring... 8/16> c/o numbness in toes & bottom of feet 8/16>  Refer to Neuro for neuropathy eval...     Anxiety/ Insomnia>  He has Chlorazepate for Prn use, and Ambien to use for sleep Qhs...  Anemia>  On FeSO4 supplement & Hg now back to normal, dyspnea ret to baseline...   Patient's Medications  New Prescriptions   No medications on file  Previous Medications   ACETAMINOPHEN (TYLENOL) 325 MG TABLET    Take 650 mg by mouth daily as needed (pain).   ASCORBIC ACID (VITAMIN C) 1000 MG TABLET    Take 1,000 mg  by mouth daily.    CHOLECALCIFEROL (VITAMIN D3) 2000 UNITS TABS    Take 2,000 Units by mouth daily.   CLORAZEPATE (TRANXENE) 7.5 MG TABLET    TAKE 1 TABLET BY MOUTH 3 TIMES A DAY AS NEEDED   DIPHENHYD-HYDROCORT-NYSTATIN (FIRST-DUKES MOUTHWASH) SUSP    1 tsp gargle and swallow four times daily   LORATADINE (CLARITIN) 10 MG TABLET    Take 10 mg by mouth daily.   MAGNESIUM 250 MG TABS    Take 250 mg by mouth at bedtime.    MULTIPLE VITAMIN (MULTIVITAMIN WITH MINERALS) TABS TABLET    Take 1 tablet by mouth daily. Centrum   ONDANSETRON (ZOFRAN) 4 MG TABLET    Take 1 tablet (4 mg total) by mouth every 8 (eight) hours as needed for nausea or vomiting.   RAPAFLO 8 MG CAPS CAPSULE    TAKE ONE CAPSULE AT BEDTIME  Modified Medications   Modified Medication Previous Medication   CLIDINIUM-CHLORDIAZEPOXIDE (LIBRAX) 5-2.5 MG CAPSULE clidinium-chlordiazePOXIDE (LIBRAX) 5-2.5 MG per capsule      Take 1 capsule by mouth 3 (three) times daily as needed.    Take 1 capsule by mouth 3 (three) times daily as needed.   LEVOTHYROXINE (SYNTHROID, LEVOTHROID) 125 MCG TABLET levothyroxine (SYNTHROID, LEVOTHROID) 125 MCG tablet      TAKE 1 TABLET (125 MCG TOTAL) BY MOUTH DAILY BEFORE BREAKFAST.    TAKE 1 TABLET (125 MCG TOTAL) BY MOUTH DAILY BEFORE BREAKFAST.   LEVOTHYROXINE (SYNTHROID, LEVOTHROID) 125 MCG TABLET levothyroxine (SYNTHROID, LEVOTHROID) 125 MCG tablet      TAKE 1 TABLET (125 MCG TOTAL) BY MOUTH DAILY BEFORE BREAKFAST.    TAKE 1 TABLET (125 MCG  TOTAL) BY MOUTH DAILY BEFORE BREAKFAST.   MECLIZINE (ANTIVERT) 25 MG TABLET meclizine (ANTIVERT) 25 MG tablet      Take 1 tablet (25 mg total) by mouth daily as needed for dizziness.    Take 1 tablet (25 mg total) by mouth daily as needed for dizziness.   METOPROLOL SUCCINATE (TOPROL XL) 25 MG 24 HR TABLET metoprolol succinate (TOPROL XL) 25 MG 24 hr tablet      Take 1 tablet (25 mg total) by mouth daily.    Take 1 tablet (25 mg total) by mouth daily.   METOPROLOL  SUCCINATE (TOPROL-XL) 25 MG 24 HR TABLET metoprolol succinate (TOPROL XL) 25 MG 24 hr tablet      TAKE 1 TABLET BY MOUTH EVERY DAY    Take 1 tablet (25 mg total) by mouth daily.   PANTOPRAZOLE (PROTONIX) 40 MG TABLET pantoprazole (PROTONIX) 40 MG tablet      Take 1 tablet (40 mg total) by mouth daily.    Take 1 tablet (40 mg total) by mouth daily.  Discontinued Medications   No medications on file

## 2016-01-01 NOTE — Progress Notes (Addendum)
Subjective:    Patient ID: Gregory [name], male    DOB: [birth year], 75 y.o.   MRN: [id number]  HPI 75 y/o WM here for a follow up visit... he has multiple medical problems including:  Hx OWR syndrome;  Hx atypCP;  Hyperchol;  Hypothyroidism;  GERD/ Divertics/ IBS/ Colon polyps;  BPH/ BOO;  DJD/ LBP/ Plantar fasciitis;  Hx dizziness;  Anxiety & chronic persistant insomnia... 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  LABS 10/14:  FLP- on diet alone looks pretty good w/ LDL=111...  EKG 10/14 showed SBrady, rate57, otherw wnl;  2DEcho 10/14 showed normal LV size & wall thickness, norm LVF w/ EF=55-60%, Gr1DD, mildly thickened AoV leaflets, mild MR;  ETT 11/14 showed 21mn on treadmill, hypertensive BP response, rare PVC, no ischemia, O2 sats dropped to 87% w/ exercise...  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 Given this mild airflow obstruction we will start Rx w/ Dulera100- 2spBid   CXR 3/15 showed normal heart size, clear lungs, mild apical scarring, right basilar density c/w AVM based on CT report 2004, NAD..Marland KitchenMarland Kitchen EKG 3/15 showed NSR, rate87, low volt, NAD...  ADDENDUM> LABS 4/15 showed>  FLP- TChol 137, TG 30, HDL 36, LDL 95;  Chems- wnl;  CBC- anemic w/ Hg=11.3 & MCV=77;  TSH=9.26...   Ret for Iron panel (Fe=16- 4%sat), Ferritin (6.4), Stool cards (pending); then start FeSO4-3285mID w/ VitC500... Recheck CBC, Iron level in 6-8weeks...   Taking Synthroid112 regularly, therefore increase back to 12529md... Recheck TSH in 53mo82moe never returned.  ~  October 08, 2014:  64mo 653mo& Gregory iMikki Santeetill working but  has cut back to 2d/wk; feeling better overall and most of his dyspnea symptoms resolved w/ FeSO4 7 return of his Hg to normal; energy is good & he exercises on treadmill, doing better on stairs, etc; he [facility] the [facility] breathing improved back to baseline...  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    Hx CP>  Prev cardiac w/u by DrCreHilary Hertzneg, no recurrent CP complaints, he remains active but notes DOE w/ stairs, ok on level ground, 2DEcho & GXT were OK x O2 sat drop to 87% at peak exercise- ?incr shunting thru his RLL AVM...    Chol> Intol to Simva40 & Cres5 due to musc cramps; FLP 4/15 on diet alone showed TChol 137, TG 30, HDL 36, LDL 95...    Hypothy>  On Synthroid 125mcg71mow>  Labs 10/15 revealed TSH= 5.10 & clinically euthyroid, continue same...Marland KitchenMarland Kitchen  GI- GERD, IBS, Polyps> on Prilosec40Qd & Zofran/ Librax prn; followed by [facility] & seen 6/15> note reviewed (heme pos stool & anemia from epistaxis & NSAIDs); he had colonoscopy 3/12- neg x for hems & DrM rec f/u 63yr...    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    Anxiety/ Insomnia>  He has Chlorazepate7.5 for Prn  use, and Ambien10 to use for sleep Qhs... We reviewed prob list, meds, xrays and labs> see below for updates >> OK 2015 Flu shot today...   LABS 10/15:  CBC- wnl w/ Hg= 16.5, Fe=88 (23%sat), Ferritin=36;  TSH=5.10... REC>  Continue Iron supplement daily, and the Synthroid125/d...   ~  April 01, 2015:  57m53moV & Gregory reports a good interval>  He has notes some intermittent & persistent nose bleeds due to his OWR & nasal obstruction, he uses nasal saline & will f/u w/ ENT- DrRosen... He also notes some discomfort in knees, hips, back but he can't take strong NSAIDs due to OWR & risk of GI bleeds; we discussed trial of Tramadol50 w/ tylenol to see how this works on a prn basis... His 3rd complaint is his chronic persistent insomnia- only sleeping 4h per night & difficulty getting back to sleep; we reviewed a few strategies to deal w/ this- he notes that Tranxene taken when he arises will help him go back to sleep 7 no hangover- this is a win-win strategy & he will continue... 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    Hx CP>  Prev cardiac w/u by DrCHilary Hertzs neg, no recurrent CP- he remains active but notes DOE w/ stairs, ok on level ground, 2DEcho & GXT were OK x O2 sat drop to 87% at peak exercise- ?incr shunting thru his RLL AVM...    Chol> Intol to Simva40 & Cres5 due to musc cramps; FLP 4/16 on diet alone showed TChol 146, TG 85, HDL 35, LDL 94...    Hypothy>  On Synthroid 125m157m now>  Labs 4/16 revealed TSH= 4.38 &  clinically euthyroid, continue same...    GI- GERD, IBS, Polyps> on Prilosec40Qd & Zofran/ Librax prn; [facility]> note reviewed (heme pos stool & anemia from epistaxis & NSAIDs); he had colonoscopy 3/12- neg x for hems & DrM rec f/u 67yrs72yr   GU>  Hx BPH w/ BOO on Rapaflo 8mg/d82mr DrEskridge; last seen 1/16 & doing satis & pt reports that his PSA was OK...    Ortho> on OTC meds prn; eval 12/11 by DrNorris for bilat leg pain & cramping, ?  min atrophy of left gastroc- he rec tonic water & neurology eval> he saw DrWillis 3/12 (note reviewed)> NCV was normal; EMG c/w mild S1 radiculopathy; he rec MRI- pt decided against the MRI;  He reports some discomfort in knees, hips, back & we decided on trial of Tramadol50...    Anxiety/ Insomnia>  He has Chlorazepate7.5 for Prn use, and Ambien10 to use for sleep Qhs... We reviewed prob list, meds, xrays and labs> see below for updates >>   CXR 4/16 showed norm heart size, clear lungs w/ some hyperinflation, RLL AVM noted w/o change...  EKG 4/16 revealed poor tracing- NSR, rate68, minor NSSTTWA, NAD...   LABS 4/16:  FLP- at goals on diet alone;  Chems- wnl;  CBC- wnl;  TSH=4.38...  ~  June 04, 2015:  30moROV & post hosp check>  [facility]ew[facility] 5/24 - 05/21/15 by [facility] after presenting w/ HA & right leg weakness; he tells me he saw his dentist for routine cleaning 2wks prior to this; eval revealed a brain lesion in left basal ganglionic region (tumor vs abscess); he had brain stereotactic bx 5/27 by DrNundkumar (left frontal craniectomy) & thick pus was removed- cultures +peptostreptococcus species (otherw neg aerobic bact cult, AFB, Fungi); he was seen by ID & placed on Rochepin, Vanco, Flagyl to be continued 194moost disch (they are checking levels and adjusting dose); also started on Keppra500Bid per NS;  Since disch he is feeling better- no f/c/s, notes sl dry cough/ no sput, & the cough is keeping him awake- TUSSIONEX called in & helps he says;   Sl nausea relieved by phenergan prn...  EXAM reveals Afeb, VSS, O2sat=94% on RA at rest;  HEENT- OWR telangiectasias w/o change;  Chest- few basilar crackles w/o wheezing, rhonchi, consolidation;  Heart- RR w/o m/r/g;  Abd- neg;  Ext- neg w/o c/c/e...  CT Head 5/16 showed 2 x 3 cm LEFT basal ganglia mass, likely tumor vs abscess, moderately extensive surrounding edema, small surgical clips within or near the posterior wall LEFT maxillary sinus/LEFT pterygopalatine fossa, in this patient with history of nose bleed (these are stable from prior scans)...  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   EKG 5/16 showed NSR, rate68, wnl, NAD...Marland KitchenMarland KitchenVen Dopplers 6/16 were neg for DVT  LABS- reviewed> only pos cult= peptostreptococcus from brain bx;  Chems- ok x Na & Ca sl low;  CBC- ok x WBC 14=>11... 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 Given IS, refill Phenergan & Tussionex...  ~  July 31, 2015:  17mo34mo & add-on appt requested for several complaints>  Gregory [name] finished his antibiotic Rx for brain abscess (likely due to dental work several weeks prior to onset of neuro symptoms in [id number] DrCampbell has arranged for a follow up CT Brain 08/11/15 (this will be 3 wks off antibiotics);  He had an  episode of tachycardia (?AFlutter  w/ rvr per EMS) w/ HR incr to 170's- went to ER 7/13 (but was in NSR) & he is currently wearing a Holter monitor per DrCrenshaw w/ follow-up Cards appt sched for next week...        He presents today w/ several complaints>  1) hoarseness: note this is intermittent, off & on, swallowing OK, denies indigestion/ reflux/ etc; he is on Protonix40 w/ his hx of OWR, plus zofran/ librax; we reviewed antireflux regimen & agreed to refer him to ENT for a look at his larynx & their opinion.Marland KitchenMarland Kitchen  2) c/o numbness in toes and bottom of his feet bilat for the last week or so w/ some discomfort while walking; no culprit meds on his list & we offered referral to Neuro for a neuropathy evaluation.Marland KitchenMarland Kitchen  3) he is c/o weight loss> weight today is 167#, 5'10" Tall, BMI=24 which is wnl; last wt=182# 80moago for a 15# wt loss, appetite is good & intake unchanged he says; we discussed caloric intake and nutritional supplements; we also checked labs> Chems- wnl, BS=103, A1c=5.7;  CBC- wnl; Note- he is on Synthroid1259m/d and TSH= 3.28 when checked 07/03/15...   CXR 07/02/15 showed norm heart size, clear lungs, NAD...   CT Head 08/11/15 showed left frontal craniotomy, resolved abscess in left external capsule w/ decr edema and decr enhancement...  2DEcho 08/14/15 showed norm LV size & function w/ EF=55-60%, no regional wall motion abn, Gr2DD, trace MR & TR  ~  October 02, 2015:  39m61moV & BobMikki Santees gained 4# up to #171# today on Ensure 1can/d:  We discussed the need for AMOXICILLIN 2gm 1H prior to dentist...    He saw ENT (we do not have note) & pt indicates that his cords were sl thinned, they offered injection rx but his hoarseness is improved & holding off...     He saw DrCampbell, ID on 8/1> f/u anaerobic brain abscess after dental work & presented w/ right sided weakness (resolved); he finished 39mo36moLevaquin & Flagyl, no f/c/s, neuro exam was WNL, & f/u scan 8/22 w/ resolved abscess; pt tells me DrCampbell signed off...     He saw  DrCrenshaw 8/18 for Cards> f/u PAFlutter episode after disch from hosp & converted spont, they did 30d Holter- all NSR/ STachy, on ToprolXL25; they rechecked his 2DEcho- normal valves, Gr2DD...  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 Overall, these findings are severe in degree electrically with respect to sensory responses. 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  We reviewed prob list, meds, xrays and labs> NOTE: THEY WANT ANOTHER CT HEAD IN [id number] ~  January 01, 2016:  68mo 39mo& Gregory iMikki Santeetable- persistent neuropathy symptoms (?related to Flagyl Rx?), c/o cold hands, speech difficulty (tongue feels bigger), ?memory problem- offered speech path assessment but he notes Neuro f/u appt in several weeks & wants to wait;  Hoarseness improved w/ MMW;  DrCampbell, ID has signed off- pt & wife want another CT Head to f/u the prev brain abscess &  we will order;  He knows to take AMOX prior to any procedures (due to his OWR)... No other interval visits in the last 7mo he requests mult med refills- ok...  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    Hx CP, episode of PAFlutter> w/u by DHilary Hertzwas neg, no recurrent CP or arrhythmia- he remains active but notes DOE w/ stairs, ok on level ground, 2DEcho & GXT were OK x O2 sat drop to 87% at peak exercise- ?incr shunting thru his RLL AVM...    Chol> Intol to Simva40 & Cres5 due to musc cramps; FLP 4/16 on diet alone showed TChol 146, TG 85, HDL 35, LDL 94...    Hypothy>  On Synthroid 1223m/d now>  Labs 7/16 revealed TSH= 3.28 & clinically euthyroid, continue same...    GI- GERD, IBS, Polyps> on Protonix40Qd & Zofran/ Librax prn; [facility] note reviewed (heme pos stool & anemia from epistaxis & NSAIDs); he had colonoscopy 3/12- neg x for hems & DrM rec f/u 5y34yr.    GU>  Hx BPH w/ BOO on Rapaflo 8mg65mper DrEskridge; last seen 1/16 & doing satis & pt reports that his PSA was OK...    Ortho> on OTC meds prn; eval 12/11 by DrNorris for bilat leg pain & cramping, ?min atrophy of left gastroc- he rec tonic water & neurology eval> he saw DrWillis 3/12 (note reviewed)> NCV was normal; EMG c/w mild S1 radiculopathy; he rec MRI- pt decided against the MRI;  He reports some discomfort in knees, hips, back & we decided on trial of Tramadol50...    Anxiety/ Insomnia>  He has Chlorazepate7.5 for Prn use, and Ambien10 to use for sleep Qhs... EXAM reveals Afeb, VSS, O2sat=91% on RA at rest;  Wt is up 8# to 179#; HEENT- OWR telangiectasias w/o change;  Chest- few basilar crackles w/o wheezing, rhonchi, consolidation;  Heart- RR w/o m/r/g;  Abd- neg;  Ext- neg w/o c/c/e...  LABS 1/17>  BMet   CT Brain 1/17> pending IMP/PLAN>>  Gregory [name] mult somatic complaints lingering & he is still quite anxious; he wants another CT scan & we will order this; he is encouraged to continue his regular f/u appts w/ his specialists & we will recheck in 4mon30monthADDENDUM>>  CT Brain1/17/17 w/ contrast showed  no acute changes, area of hypoattenuation in left ext capsule & lentiform nucleus extending to the white matter of the centrum semiovale is improved from 07/2015, no abn post contrast enhacement- residual infection is unlikely... He will decide if he wants to repeat the scan in ~94mo..62mo        Problem List:   BRAIN ABSCESS  >>  Gregory waM[facility]eeosp 5[facility]24 - 05/21/15 by [facility] after presenting w/ HA & right leg weakness; he tells me he saw his dentist for routine cleaning 2wks prior to this; eval revealed a brain lesion in left basal ganglionic region (tumor vs abscess); he had brain stereotactic bx 5/27  by Alfonso Ramus (left frontal craniectomy) & thick pus was removed- cultures +peptostreptococcus species (otherw neg aerobic bact cult, AFB, Fungi); he was seen by ID & placed on Rochepin, Vanco, Flagyl to be continued 25mopost disch (they are checking levels and adjusting dose); also started on Keppra500Bid per NS... ~  6/16:  Since disch he is feeling better- no f/c/s, notes sl dry cough/ no sput, & the cough is keeping him awake- TUSSIONEX called in & helps he says;  Sl nausea relieved by phenergan prn...  ~  8/16:  He was checked by DrCampbell, ID- they stopped antibiotics and rec f/u CT Brain 08/11/15= 3 wks off the antibiotic rx => resolved abscess. ~  1/17:  CT Brain - pending  OTHER DISEASES OF NASAL CAVITY AND SINUSES - Hx of Osler-Weber-Rendue syndrome/ hereditary telangiectasias... he's had numerous nose bleeds and surgery by DEtter Sjogren now followed by DBarbaraann Faster.. left nares is occluded w/ skin graft- no lumen... this is quite uncomfortable to the patient but he's been told there is nothing further that can be done for this... ~  3/11:  notes occas nosebleeds but he is able to handle them... ~  4/12:  No diff in his pattern of mild nose bleeds & he denies any severe epistaxis episodes... 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 ~  4/14:  He notes  infreq bleeding now w/ saline etc... 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 Dyspnea w/ O2 desat w/ exercise >>  ~  12/14:  PFTs showed FVC=3.98 (89%), FEV1=2.72 (79%), %1sec=68, and mid-flows= 51% predicted; c/w mild obstructive dis & we decided on a trial of Dulera100-2spBid ~  4/15:  His breathing is improved w/ the [facility]... ~  10/15:  His breathing is back to baseline after FeSO4 rx for anemia & ret of Hg to normal; he has [facility] the [facility]& has it handy for prn use... ~  4/16:  prev on Dulera100-2spBid, now just prn; breathing improved w/ exercise; O2 sat= 93% on RA today; c/o dry throat & encouraged to use lozenges etc... ~  CXR 4/16 showed norm heart size, clear lungs w/ some hyperinflation, RLL AVM noted w/o change... 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 known GI telangiectasia as well without signif GI bleeding in the past- [facility] he's also has [facility]angiectasis [facility]/ [facility].. extensive nasal problems as above... ~  labs 2/09 showed Hg= 17.0 ~  labs 2/10  showed Hg= 16.4 ~  CXR 3/11 is chr incr markings esp RLL- no change, NAD...  ~  Labs 6/11 showed Hg= 15.9 ~  Labs 4/12 showed Hg= 16.0 ~  CXR 10/12 showed mild biapical scarring, RLL AVM, NAD... ~  Labs 4/13 showed Hg= 15.2 ~  CXR 8/13 showed heart at upper lim of norm, increased markings & apical scarring, chr incr markings in RLL- AVM, NAD.[name]. 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 ~  CXR 3/15 showed normal heart size, clear lungs, mild apical scarring, right basilar density c/w AVM based on CT report 2004, NAD.[name]. ~  CXR 4/16 showed norm heart size, clear lungs w/ some hyperinflation, RLL AVM noted w/o change... 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  Hx of CHEST PAIN (ICD-786.50) - seen Sep09 w/ several recent bouts of CP while working in yard, washing car, etc... resolved w/ rest & Protonix... EKG showed WNL, [facility] was neg (no ischemia or infarction and EF= 61%)... Cardiac eval DrCrenshaw w/ 2DEcho 10/09 showing norm LV- no regional wall motion abn, norm LVF w/ EF= 60%, mild dil RA/ RV;  and norm MYOVIEW (no ischemia or infarction, EF= 58%)... ~  10/14: repeat cardiac eval by DrCrenshaw due to [place] w/ stairs; EKG 10/14 showed SBrady, rate57, otherw wnl;  2DEcho 10/14 showed normal LV size & wall thickness, norm LVF w/ EF=55-60%, Gr1DD, mildly thickened AoV leaflets, mild MR;  ETT 11/14 showed 67mn on treadmill, hypertensive BP response, rare PVC, no ischemia, O2 sats dropped to 87% w/ exercise ~  EKG 3/15 showed NSR, rate87, low volt, NAD..Marland KitchenMarland Kitchen  Episode of AFLUTTER >> this occurred 06/2015 & notes from Cards ([facility]& SRichardson Doppare reviewed);  placed on lose dose TOPROL-XL & titrated up to 2153md... Holter monitor placed and results pending...   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 ~  FLP 6/08 showed TChol 208, TG 75, HDL 34, LDL 139... he preferred diet Rx- ~  FLP 2/09 showed TChol 172, TG 69, HDL 30, LDL 129... he agreed to CreSimpson ~  FLPEagar09 on Crestor10 showed TChol 91, TG 49, HDL 28, LDL 54... rec- keep same. ~  FLP 2/10 on Crestor10 showed TChol 108, TG 56, HDL 31, LDL 66 ~  9/10:  we discussed changing Crestor10 to Simvastatin40 for $$$ reasons... ~  FLPWynnedale11 on Simva40 showed TChol 105, TG 59, HDL 39, LDL 54... continue same. ~  FLPSinton12 on Simva40 showed TChol 104, TG 62, HDL 29, LDL 63... C/o severe muscle cramps & wants to stop for 57mo40moamping resolved! ~  Referred to [facility] & they restarted his Crestor at 53mg/23mtol well so far... ~  FLP 10/12 on Cres5 showed TChol 114, TG 40, HDL 36, LDL 70 ~  FLP 2/13 on Cres5 showed TChol 124, TG 21, HDL 40, LDL 80 ~  FLP 4/14 on Cres5 showed TChol 119, TG 44, HDL 38, LDL 72  ~  6/14: pt stopped Cres5 on his own due to leg cramps & decided to hold statins for now  w/ repeat FLP off meds in several months... ~  FLP 10/14 on diet alone showed TChol 155, TG 29, HDL 38, LDL 111  ~  FLP 4/15 on diet alone showed TChol 137, TG 30, HDL 36, LDL 95 ~  FLP 4/16 on diet alone showed TChol 146, TG 85, HDL 35, LDL 94  HYPOTHYROIDISM - on SYNTHROID 181mg/d now; prev 1240m/d dose was decreased 4/12... ~  labs 2/09 showed TSH= 0.69...  8/09 showed TSH= 0.44... ~  labs 2/10 showed TSH= 0.68 ~  labs 3/11 showed TSH= 0.52 ~  Labs 4/12 on Synthroid125 showed TSH= 0.33... We decided to decr the dose to 112/d. ~  Labs 4/13 on Levo112 showed TSH= 4.60 ~  Labs 4/14 on Levo112 showed TSH= 5.46 ~  Labs 4/15 on Levo112 showed TSH= 9.26 and we decided to incr dose back to 12533md... ~  Labs  4/16 on Levo125 showed TSH= 4.38  GERD, IRRITABLE BOWEL SYNDROME, COLONIC POLYPS - [facility] (he tried OTC Zantac 57m96m his own)... [place]  [place] ~  colonoscopy 1/04 w/ divertics, diminutive rectal polyp (adenomatous), hems...  ~  f/u colon 1/07 showed no recurrent polyps... ~  [facility] HYPERTROPHY PROSTATE W/UR OBST & OTH LUTS (ICD-600.01) - followed by DrRDaOTRRNHAFAPAFLO 8mg/d12mtopped Flomax due to side effect- ED)... doing well without LTOS, just complains of nocturia x 1-2 ~  labs 2/09 showed PSA= 0.76 ~  labs 2/10 showed PSA= 0.90 ~  2011 PSA checked by DrDavis (pt states it was OK)... ~  Labs 4/12 showed PSA= 1.22 ~  Labs 4/13 showed PSA= 1.31 ~  10/13: presents c/o incr freq, nocturia x2-4, & weak stream despite Rapaflo; he is intol to Flomax; refer to Urology for further eval=> seen by DrEskridge, no changes made... ~  1/15: f/u by Urology, DrEskridge> BPH, LUTS, hypersens unstable bladder; on Rapaflo8 & doing satis, PSA reported to be 2.34...  LEFT ARM & SHOULDER PAIN >> he was checked by GboroOLacie Scottsot improving he says; [facility] reviewed.  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 ~  9/10:  requesting Rx for Robaxin for muscle spasm as needed.  PLANTAR FASCIITIS (ICD-728.71) - c/o pain in his feet secondary to plantar fasciitis eval & Rx from [facility] w/ Celebrex & shots... ~  9/10: we [facility].  Hx NOCTURNAL LEG CRAMPS >>  ~  This initially occurred several yrs ago while on Simvastatin & the cramps resolved off this med; he was subseq placed on Cres5 & tol well... ~  6/14: he indicates that noct leg cramps returned x1wk & he stopped the Cres5 w/ improvement; he wants to leave off the statins for now;  rec to try tonic water/ yellow mustard prn...  Hx of DIZZINESS (ICD-780.4) - sudden episode dizziness Sep09 working at [employer] 1PM w/[employer] spinning- lasted 10min 95mresolved spontaneously, felt light headed afterwards... no LOC, syncope, seizure activity, etc... no CP/ palpit/ SOB/ etc... there were several EMTs in the restaurant and they checked him out- stable and transported to ER... 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 treaMarland Kitchened w/  MECLIZINE w/ improvement.  ANXIETY (ICD-300.00) & INSOMNIA, CHRONIC (ICD-307.42) - he uses CHLORAZEPATE 7.8m Prn & AMBIEN187mQhs for chronic persistant insomnia...  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  ANEMIA >>  ~  LABS 4/13 showed Hg= 15.2, MCV=91 ~  LABS 4/14 showed Hg= 14.5, MCV=84 ~  LABS 3/15 showed Hg= 13.6, MCV=78 ~  LABS 4/15 showed Hg= 11.3, MCV= 77, Iron studies showed Fe= 16 (3.6%sat), Ferritin= 6.4, & stool cards neg; Rx w/ FeSO4... ~  Labs 10/15 showed Hg= 16.5, MCV= 91, Fe=88 (23%sat), Ferritin= 36...  ~  Labs 4/16 showed Hg= 16.0  Health Maintenance: ~  [facility]  [facility]... (last 1/07 & 5y47yru due 1/12). ~  GU:  followed by DrRDavis in past; PSAs have all been wnl... ~  Immunizations:  he gets yearly Flu vaccinations in the fall;  OK Pneumovax 03/12/10 at age- [age]   Past Surgical History  Procedure Laterality Date  . Varicocelectomy  1991    Dr. 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 Other surgical history      pulse laser for facial telangiectasias inthe past  . Excision left wrist ganglian/ myxoid cyst  05-30-2009  . Cardiovascular stress test  01-14-2003    NO ISCHEMIA / EF 61%/ NORMAL LE WALL MOTION  .  Transthoracic echocardiogram  10-17-2008   DR CRENSHAW    NORMAL LVF/ EF 60%/  MILDLY DILATED RIGHT ATRIUM/ VENTRICULE  . 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 HavRenda Rolls Brain biopsy Left 05/16/2015    Procedure: Stereotactic Left Brain Biopsy with Brain Lab;  Surgeon: NeeConsuella LoseD;  [facility];  Service: Neurosurgery;  Laterality: Left;  Stereotactic Left Brain biopsy with brainlab  . Application of cranial navigation N/A 05/16/2015    Procedure: APPLICATION OF CRANIAL NAVIGATION;  Surgeon: NeeConsuella LoseD;  [facility];  Service: Neurosurgery;  Laterality: N/A;    Outpatient Encounter Prescriptions as of 01/01/2016  Medication Sig  . acetaminophen (TYLENOL) 325 MG tablet Take 650 mg by mouth daily as needed (pain).  . Cholecalciferol (VITAMIN D3) 2000 UNITS TABS Take 2,000 Units by mouth daily.  . clidinium-chlordiazePOXIDE (LIBRAX) 5-2.5 MG capsule Take 1 capsule by mouth 3 (three) times daily as needed.  . clorazepate (TRANXENE) 7.5 MG tablet TAKE 1 TABLET BY MOUTH 3 TIMES A DAY AS NEEDED  . Diphenhyd-Hydrocort-Nystatin (FIRST-DUKES MOUTHWASH) SUSP 1 tsp gargle and swallow four times daily  . levothyroxine (SYNTHROID, LEVOTHROID) 125 MCG tablet TAKE 1 TABLET (125 MCG TOTAL) BY MOUTH DAILY BEFORE BREAKFAST.  . lMarland Kitchenratadine (CLARITIN) 10 MG tablet Take 10 mg by mouth daily.  . Magnesium 250 MG TABS Take 250 mg by mouth at bedtime.   . meclizine (ANTIVERT) 25 MG tablet Take 1 tablet (25 mg total) by mouth daily as needed for dizziness.  . metoprolol succinate (TOPROL XL) 25 MG 24 hr tablet Take 1 tablet (25 mg total) by mouth daily.  . Multiple Vitamin (MULTIVITAMIN WITH MINERALS) TABS tablet Take 1 tablet by mouth daily. Centrum  . ondansetron (ZOFRAN) 4 MG tablet Take 1 tablet (4 mg total) by mouth every 8 (eight) hours as needed for nausea or vomiting.  . pantoprazole (PROTONIX)  40 MG tablet Take 1 tablet (40 mg total) by mouth daily.   . silodosin (RAPAFLO) 8 MG CAPS capsule Take 1 capsule (8 mg total) by mouth at bedtime.    Allergies  Allergen Reactions  . Nsaids Other (See Comments)    Cannot take-per MD  . Other Other (See Comments)    All blood thinners-cannot take per MD  . Aspirin Other (See Comments)    nosebleeds  . Statins Other (See Comments)    Weakness, myalgias     Current Medications, Allergies, Past Medical History, Past Surgical History, Family History, and Social History were reviewed in [employer] record.    Review of Systems       See HPI - other systems neg except as noted... The patient denies anorexia, fever, weight loss, weight gain, vision loss, decreased hearing, hoarseness, chest pain, syncope, dyspnea on exertion, peripheral edema, prolonged cough, headaches, hemoptysis, abdominal pain, melena, hematochezia, severe indigestion/heartburn, hematuria, incontinence, muscle weakness, suspicious skin lesions, transient blindness, difficulty walking, depression, unusual weight change, abnormal bleeding, enlarged lymph nodes, and angioedema.     Objective:   Physical Exam      WD, WN, 75 y/o WM in NAD... 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 GENERAL:  Alert & oriented; pleasant & cooperative... HEENT:  [place]/AT, EOM-wnl, PERRLA, EACs-clear, TMs-wnl, NOSE- occluded left nares from skin grafts, MOUTH- mucosal telangiectasias.  NECK:  Supple w/ fairROM; no JVD; normal carotid impulses w/o bruits; no thyromegaly or nodules palpated; no lymphadenopathy. CHEST:  Clear to P & A; without wheezes/ rales/ or rhonchi. HEART:  Regular Rhythm; without murmurs/ rubs/ or gallops. ABDOMEN:  Soft & nontender; normal bowel sounds; no organomegaly or masses detected. EXT: without deformities, mild arthritic changes; no varicose veins/ venous insuffic/ or edema. NEURO:  CN's intact; motor testing normal; sensory testing normal; gait normal & balance OK. DERM:  mult  telangiectasias...  RADIOLOGY DATA:  Reviewed in the EPIC EMR & discussed w/ the patient...  LABORATORY DATA:  Reviewed in the EPIC EMR & discussed w/ the patient...   Assessment & Plan:    BRAIN ABSCESS >> [facility] 5/16, followed by ID, DrCampbell, finished antibiotic rx 07/2015 and f/u CT Brain=> resolved abscess... He will need antibiotic prophylaxis prior to future dental work...  Hx Cough, Dyspnea on exertion, mild COPD >> as above, prev on Dulera100- 2sp Bid & exercise program- improved; we will continue to monitor his status & O2 sats; Dyspnea improved w/ Fe for anemia & ret of Hg to norm... He has Tussionex prn cough... 8/16> c/o intermittent hoarseness & referred to ENT for eval of cords => we don't have note but pt indicates sl thinning of cords and they offered injections but he's holding off...  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     Hx CP, AFlutter>  Prev cardiac w/u by DrCrenshaw was neg, no recurrent CP complaints, but he notes DOE w/ stairs & DrCrenshaw repeated 2DEcho & ETT as above... Episode of PAFlutter 06/2015=> holter monitor per cards- pending & pt improved on ToprolXL25...      Chol>  Off statins due  to recurrent cramps, on diet alone & FLP is reasonable...     Hypothy>  On Synthroid 1655mg/d now>  He is clinically & biochem euthyroid...     [facility]>  [facility]- neg x for hems & DrM rec f/u 553yr..     GU>  Hx BPH w/ BOO & followed by DrEskridge on Rapaflo 55m23m & stable...     Ortho>  See above- prev stable on OTC analgesics as needed, but recent incr symptoms- try Tramadol50  prn...  LEG CRAMPS >> he is quite sure they are from his statin Rx & we decided to leave these off for now, try to control on diet/ exercise & continued monitoring... 8/16> c/o numbness in toes & bottom of feet 8/16>  Refer to Neuro for neuropathy eval...     Anxiety/ Insomnia>  He has Chlorazepate for Prn use, and Ambien to use for sleep Qhs...  Anemia>  On FeSO4 supplement & Hg now back to normal, dyspnea ret to baseline...   MULT MEDICATION REFILLS WERE PROVIDED TODAT AT Pt's REQUEST >>  Patient's Medications  New Prescriptions   No medications on file  Previous Medications   ACETAMINOPHEN (TYLENOL) 325 MG TABLET    Take 650 mg by mouth daily as needed (pain).   CHOLECALCIFEROL (VITAMIN D3) 2000 UNITS TABS    Take 2,000 Units by mouth daily.   CLORAZEPATE (TRANXENE) 7.5 MG TABLET    TAKE 1 TABLET BY MOUTH 3 TIMES A DAY AS NEEDED   LORATADINE (CLARITIN) 10 MG TABLET    Take 10 mg by mouth daily.   MAGNESIUM 250 MG TABS    Take 250 mg by mouth at bedtime.    MECLIZINE (ANTIVERT) 25 MG TABLET    Take 1 tablet (25 mg total) by mouth daily as needed for dizziness.   MULTIPLE VITAMIN (MULTIVITAMIN WITH MINERALS) TABS TABLET    Take 1 tablet by mouth daily. Centrum   ONDANSETRON (ZOFRAN) 4 MG TABLET    Take 1 tablet (4 mg total) by mouth every 8 (eight) hours as needed for nausea or vomiting.  Modified Medications   Modified Medication Previous Medication   CLIDINIUM-CHLORDIAZEPOXIDE (LIBRAX) 5-2.5 MG CAPSULE clidinium-chlordiazePOXIDE (LIBRAX) 5-2.5 MG capsule      Take 1 capsule by mouth 3 (three) times daily as needed.    Take 1 capsule by mouth 3 (three) times daily as needed.   DIPHENHYD-HYDROCORT-NYSTATIN (FIRST-DUKES MOUTHWASH) SUSP Diphenhyd-Hydrocort-Nystatin (FIRST-DUKES MOUTHWASH) SUSP      1 tsp gargle and swallow four times daily    1 tsp gargle and swallow four times daily   LEVOTHYROXINE (SYNTHROID, LEVOTHROID) 125 MCG TABLET levothyroxine (SYNTHROID, LEVOTHROID) 125 MCG  tablet      TAKE 1 TABLET (125 MCG TOTAL) BY MOUTH DAILY BEFORE BREAKFAST.    TAKE 1 TABLET (125 MCG TOTAL) BY MOUTH DAILY BEFORE BREAKFAST.   METOPROLOL SUCCINATE (TOPROL XL) 25 MG 24 HR TABLET metoprolol succinate (TOPROL XL) 25 MG 24 hr tablet      Take 1 tablet (25 mg total) by mouth daily.    Take 1 tablet (25 mg total) by mouth daily.   PANTOPRAZOLE (PROTONIX) 40 MG TABLET pantoprazole (PROTONIX) 40 MG tablet      Take 1 tablet (40 mg total) by mouth daily.    Take 1 tablet (40 mg total) by mouth daily.   SILODOSIN (RAPAFLO) 8 MG CAPS CAPSULE RAPAFLO 8 MG CAPS capsule      Take 1 capsule (8 mg  total) by mouth at bedtime.    TAKE ONE CAPSULE AT BEDTIME  Discontinued Medications   ASCORBIC ACID (VITAMIN C) 1000 MG TABLET    Take 1,000 mg by mouth daily.    LEVOTHYROXINE (SYNTHROID, LEVOTHROID) 125 MCG TABLET    TAKE 1 TABLET (125 MCG TOTAL) BY MOUTH DAILY BEFORE BREAKFAST.   METOPROLOL SUCCINATE (TOPROL-XL) 25 MG 24 HR TABLET    TAKE 1 TABLET BY MOUTH EVERY DAY

## 2016-01-02 ENCOUNTER — Ambulatory Visit: Payer: Medicare Other | Admitting: Pulmonary Disease

## 2016-01-06 ENCOUNTER — Other Ambulatory Visit: Payer: Self-pay | Admitting: Pulmonary Disease

## 2016-01-06 ENCOUNTER — Ambulatory Visit (INDEPENDENT_AMBULATORY_CARE_PROVIDER_SITE_OTHER)
Admission: RE | Admit: 2016-01-06 | Discharge: 2016-01-06 | Disposition: A | Discharge status: Home / Self Care | Payer: Medicare Other | Source: Ambulatory Visit | Attending: Pulmonary Disease | Admitting: Pulmonary Disease

## 2016-01-06 DIAGNOSIS — I6389 Other cerebral infarction: Secondary | ICD-10-CM

## 2016-01-06 DIAGNOSIS — I780 Hereditary hemorrhagic telangiectasia: Secondary | ICD-10-CM

## 2016-01-06 DIAGNOSIS — G060 Intracranial abscess and granuloma: Secondary | ICD-10-CM

## 2016-01-06 DIAGNOSIS — I638 Other cerebral infarction: Secondary | ICD-10-CM

## 2016-01-06 MED ORDER — IOHEXOL 300 MG/ML  SOLN
80.0000 mL | Freq: Once | INTRAMUSCULAR | Status: AC | PRN
  Administered 2016-01-06: 80 mL via INTRAVENOUS

## 2016-01-12 ENCOUNTER — Encounter: Payer: Self-pay | Admitting: Neurology

## 2016-01-12 ENCOUNTER — Ambulatory Visit (INDEPENDENT_AMBULATORY_CARE_PROVIDER_SITE_OTHER): Payer: Medicare Other | Admitting: Neurology

## 2016-01-12 VITALS — BP 110/60 | HR 57 | Ht 70.0 in | Wt 182.1 lb

## 2016-01-12 DIAGNOSIS — G609 Hereditary and idiopathic neuropathy, unspecified: Secondary | ICD-10-CM | POA: Diagnosis not present

## 2016-01-12 DIAGNOSIS — G620 Drug-induced polyneuropathy: Secondary | ICD-10-CM

## 2016-01-12 DIAGNOSIS — G060 Intracranial abscess and granuloma: Secondary | ICD-10-CM

## 2016-01-12 NOTE — Progress Notes (Signed)
Follow-up Visit   Date: 01/12/2016   Gregory [name] MRN: [id number] DOB: [birth year]   Interim History: Gregory [name] is a 75 y.o. right-handed Caucasian male with hypothyroidism, hyperlipidemia, Osler-Weber- Rendu disease, GERD, and recently diagnosed brain abscess returning to the clinic for follow-up of bilateral feet paresthesias.  The patient was accompanied to the clinic by self.  History of present illness: He was hospitalized from May 24 - May 21 2015 with right sided leg weakness and headaches. Patient was subsequently found to have a brain mass 3 x 2 cm involving the basal ganglia with midline shift and edema seen on CT head. 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 Appropriate IV antibiotics including vancomycin, rocephin, and flagyl was started. He was also started on keppra 500mg  BID. Of note, he had routine dental work 2 weeks prior to symptom onset.  He has completed his course of antibiotics therapy with IV for 6 weeks and transitioned to oral levofloxacin and metronidazole for 4 weeks. He has been off antibiotics since early August. He has been doing well and complains of mild right leg weakness. He is scheduled for repeat CT brain in mid-August.   Starting ~ late July 2016, he started noticing numbness/tingling of the toes and shooting pain involving the sole of the foot. Symptoms are constant and involve both feet equally. Shooting pain is worse with weight bearing. Nothing alleviates the pain. He endorses mild low back pain.  UPDATE 09/11/2015:  He no longer has tingling sensation of leg, but continues to have persistent numbness of the feet. Gabapentin was stopped because of GI side effects.  Physical therapy has significantly improved his leg strength, which he feels is back to baseline.  He has developed hoarseness and has seen ENT, who noted thin cords.  His hand writing is slowly improving, but  still not completely improved. Interval CT head from August shows resolving abscess of the left external capsule with reduced edema.   UPDATE 01/12/2016:   He complains of intermittent slurred speech, occuring several times per week.  There is no pattern that he had identified with his speech.  No word-finding difficulty.  Over the holidays, he was not compliant with his home exercises and restarted this and noticed that his right leg seems slightly weaker than before.  No recent falls.  He continues to have persistent numbness of the feet.  He also complains of cold hands.  Denies double vision, dysphagia.     Medications:  Current Outpatient Prescriptions on File Prior to Visit  Medication Sig Dispense Refill  . acetaminophen (TYLENOL) 325 MG tablet Take 650 mg by mouth daily as needed (pain).    . Cholecalciferol (VITAMIN D3) 2000 UNITS TABS Take 2,000 Units by mouth daily.    . clidinium-chlordiazePOXIDE (LIBRAX) 5-2.5 MG capsule Take 1 capsule by mouth 3 (three) times daily as needed. 60 capsule 0  . clorazepate (TRANXENE) 7.5 MG tablet TAKE 1 TABLET BY MOUTH 3 TIMES A DAY AS NEEDED 90 tablet 1  . Diphenhyd-Hydrocort-Nystatin (FIRST-DUKES MOUTHWASH) SUSP 1 tsp gargle and swallow four times daily 120 mL 5  . levothyroxine (SYNTHROID, LEVOTHROID) 125 MCG tablet TAKE 1 TABLET (125 MCG TOTAL) BY MOUTH DAILY BEFORE BREAKFAST. 90 tablet 3  . loratadine (CLARITIN) 10 MG tablet Take 10 mg by mouth daily.    . Magnesium 250 MG TABS Take 250 mg by mouth at bedtime.     . meclizine (ANTIVERT) 25  MG tablet Take 1 tablet (25 mg total) by mouth daily as needed for dizziness. 30 tablet 2  . metoprolol succinate (TOPROL XL) 25 MG 24 hr tablet Take 1 tablet (25 mg total) by mouth daily. 90 tablet 3  . Multiple Vitamin (MULTIVITAMIN WITH MINERALS) TABS tablet Take 1 tablet by mouth daily. Centrum    . ondansetron (ZOFRAN) 4 MG tablet Take 1 tablet (4 mg total) by mouth every 8 (eight) hours as needed for nausea  or vomiting. 30 tablet 2  . pantoprazole (PROTONIX) 40 MG tablet Take 1 tablet (40 mg total) by mouth daily. 90 tablet 3  . silodosin (RAPAFLO) 8 MG CAPS capsule Take 1 capsule (8 mg total) by mouth at bedtime. 90 capsule 3   No current facility-administered medications on file prior to visit.    Allergies:  Allergies  Allergen Reactions  . Nsaids Other (See Comments)    Cannot take-per MD  . Other Other (See Comments)    All blood thinners-cannot take per MD  . Aspirin Other (See Comments)    nosebleeds  . Statins Other (See Comments)    Weakness, myalgias    Review of Systems:  CONSTITUTIONAL: No fevers, chills, night sweats, or weight loss.  EYES: No visual changes or eye pain ENT: No hearing changes.  No history of nose bleeds.   RESPIRATORY: No cough, wheezing and shortness of breath.   CARDIOVASCULAR: Negative for chest pain, and palpitations.   GI: Negative for abdominal discomfort, blood in stools or black stools.  No recent change in bowel habits.   GU:  No history of incontinence.   MUSCLOSKELETAL: No history of joint pain or swelling.  No myalgias.   SKIN: Negative for lesions, rash, and itching.   ENDOCRINE: Negative for cold or heat intolerance, polydipsia or goiter.   PSYCH:  No depression or anxiety symptoms.   NEURO: As Above.   Vital Signs:  BP 110/60 mmHg  Pulse 57  Ht 5\' 10"  (1.778 m)  Wt 182 lb 1 oz (82.583 kg)  BMI 26.12 kg/m2  SpO2 93%  Neurological Exam: MENTAL STATUS including orientation to time, place, person, recent and remote memory, attention span and concentration, language, and fund of knowledge is normal.  Speech is not dysarthric, gutteral and lingual sounds intact.  CRANIAL NERVES:  Pupils equal round and reactive to light.  Normal conjugate, extra-ocular eye movements in all directions of gaze.  No ptosis.  Face is symmetric and muscles are intact. Palate elevates symmetrically.  Tongue is midline.  MOTOR:  Motor strength is 5/5 in  all extremities.   No pronator drift.  Mild increase in the tone of the legs.   Radial pulses are strong.   MSRs:  Reflexes are 3+/4 throughout.  SENSORY:  Vibration is reduced at the ankles bilaterally, intact at the knees.    COORDINATION/GAIT:  Normal finger-to- nose-finger. Intact rapid alternating movements bilaterally.  Gait narrow based and stable. Stressed and tandem gait intact.  Able to stand from seated position without using arms.    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  Stable 4 mm LEFT-to-RIGHT midline shift.  CT head 06/24/2015:  Significant interval improvement when compared to the 05/13/2015 examination.  The ring-enhancing lesion which was centered in the left basal ganglia has decreased significantly in  size. Currently, 2.3 x 0.9 cm region of relatively solid enhancement superior aspect of the left basal ganglia remains as versus prior 3.1 x 1.9 cm ring-enhancing lesion. Significant decrease in surrounding vasogenic edema and marked decrease mass effect upon the left lateral ventricle. What remains may represent residua of treated abscess. This will need to be followup until complete clearance. As this clears it would be helpful to exclude the possibility of underlying vascular abnormality in this patient with Osler-Weber-Rendu.  CT head 08/11/2015: Resolving abscess left external capsule. There is decreased edema and decreased enhancement in this area. No residual fluid collection. No other areas of acute abnormality.  CT head wwo contrast 01/06/2016:  Hypoattenuation in the LEFT basal ganglia and regional white matter is improved from August but not yet normalized. No abnormal postcontrast enhancement to definitively  suggest residual infection. Continued surveillance may be warranted, given the patient's residual RIGHT leg weakness and intermittent slurred speech.   NCS/EMG of the legs 09/16/2015: 1. The electrophysiologic findings are most consistent with a generalized sensorimotor polyneuropathy, axon loss in type, affecting the lower extremities. Overall, these findings are severe in degree electrically with respect to sensory responses. 2. 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  Labs 09/11/2015:  vitamin B12 874, folate 24.8, vitamin B1 36, copper 77, vitamin B6 73.8, myasthenia panel negative   IMPRESSION/PLAN: 1. 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  2.  Right basal ganglia polymicrobial abscess, completed course of antibiotic therapy.  CT brain earlier this month was reviewed and continues to show residual left basal ganglia changes, improved from previously, no evidence of infection.  The location of his abscess involving the basal ganglia is most likely accounting for his mild right leg weakness (subjective) and dysarthria (not apparent on today's exam).  MRI cannot be performed due to metal in his face.  Overall symptoms are intermittent and mild.  Continue home exercises for leg strengthening and continue to follow symptoms clinically.    3.  Hoarseness - resolved, MG panel negative.  4.  Unclear etiology of cold hands, consider checking TSH at next follow-up visit with PCP.     Return to clinic as needed    The duration of this appointment visit was 30 minutes of face-to-face time with the patient.  Greater than 50% of this time was spent in counseling, explanation of diagnosis, planning of further management, and coordination of care.   Thank you for allowing me to participate in patient's care.  If I can answer any additional questions, I would be pleased to do  so.    Sincerely,    Javaria Knapke K. Posey Pronto, DO

## 2016-02-13 NOTE — Progress Notes (Signed)
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  Admitted 5/16 with headache and right lower extremity weakness. CT of the head demonstrated a brain mass measuring 3 x 2 cm with midline shift and edema. This was a ring enhancing mass in the left basal ganglia. He underwent stereotactic biopsy by neurosurgery. He was diagnosed with a brain abscess. Notes from ID indicate his cultures grew out Peptostreptococcus sp. Patient was followed by infectious disease in the hospital and placed on vancomycin, Rocephin and Flagyl. This was to be continued for one month post discharge. He was also placed on Keppra for seizure prophylaxis. 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   Following DC, patient had an episode of elevated heart rate. EMS was called and he was noted to be in atrial flutter. This converted to sinus rhythm spontaneously. Patient placed on toprol. Event monitor showed slow atrial flutter versus ectopic atrial tachycardia. Echocardiogram August 2016 showed normal LV systolic function, grade 2 diastolic dysfunction. Since he was last seen the patient denies any dyspnea on exertion, orthopnea, PND, pedal edema, palpitations, syncope or chest pain.   Current Outpatient Prescriptions  Medication Sig Dispense Refill  . acetaminophen (TYLENOL) 325 MG tablet Take 650 mg by mouth daily as needed (pain).    . Cholecalciferol (VITAMIN D3) 2000 UNITS TABS Take 2,000 Units by mouth daily.    . clidinium-chlordiazePOXIDE (LIBRAX) 5-2.5 MG capsule Take 1 capsule by mouth 3 (three) times daily as needed. 60 capsule 0  . clorazepate (TRANXENE) 7.5 MG tablet TAKE 1 TABLET BY MOUTH 3 TIMES A DAY AS NEEDED 90 tablet 1  . Diphenhyd-Hydrocort-Nystatin (FIRST-DUKES MOUTHWASH) SUSP 1 tsp gargle and swallow four times daily 120 mL 5  . levothyroxine  (SYNTHROID, LEVOTHROID) 125 MCG tablet TAKE 1 TABLET (125 MCG TOTAL) BY MOUTH DAILY BEFORE BREAKFAST. 90 tablet 3  . loratadine (CLARITIN) 10 MG tablet Take 10 mg by mouth daily.    . Magnesium 250 MG TABS Take 250 mg by mouth at bedtime.     . meclizine (ANTIVERT) 25 MG tablet Take 1 tablet (25 mg total) by mouth daily as needed for dizziness. 30 tablet 2  . metoprolol succinate (TOPROL XL) 25 MG 24 hr tablet Take 1 tablet (25 mg total) by mouth daily. 90 tablet 3  . Multiple Vitamin (MULTIVITAMIN WITH MINERALS) TABS tablet Take 1 tablet by mouth daily. Centrum    . pantoprazole (PROTONIX) 40 MG tablet Take 1 tablet (40 mg total) by mouth daily. 90 tablet 3  . silodosin (RAPAFLO) 8 MG CAPS capsule Take 1 capsule (8 mg total) by mouth at bedtime. 90 capsule 3   No current facility-administered medications for this visit.     Past Medical History  Diagnosis Date  . Other diseases of nasal cavity and sinuses(478.19)   . Pure hypercholesterolemia   . Unspecified hypothyroidism   . Irritable bowel syndrome   . Benign neoplasm of colon   . Hypertrophy of prostate with urinary obstruction and other lower urinary tract symptoms (LUTS)   . Lumbago   . Plantar fascial fibromatosis   . Anxiety state, unspecified   . Persistent disorder of initiating or maintaining sleep   . GERD (gastroesophageal reflux disease)   . Arthritis   . 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 History of nonmelanoma skin cancer EXCISION SQUAMOUS CELL FROM HAND  . 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 Varicocelectomy  1991    Dr. Tresa Endo  . 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 Other surgical history      pulse laser for facial telangiectasias  inthe past  . Excision left wrist ganglian/ myxoid cyst  05-30-2009  . Cardiovascular stress test  01-14-2003    NO ISCHEMIA / EF 61%/ NORMAL LE WALL MOTION  . Transthoracic echocardiogram  10-17-2008   DR Aasia Peavler    NORMAL LVF/ EF 60%/  MILDLY DILATED RIGHT ATRIUM/ VENTRICULE  . 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 Renda Rolls  . Brain biopsy Left 05/16/2015    Procedure: Stereotactic Left Brain Biopsy with Brain Lab;  Surgeon: Consuella Lose, MD;  Location: [place] [facility];  Service: Neurosurgery;  Laterality: Left;  Stereotactic Left Brain biopsy with brainlab  . Application of cranial navigation N/A 05/16/2015    Procedure: APPLICATION OF CRANIAL NAVIGATION;  Surgeon: Consuella Lose, MD;  Location: [place] [facility];  Service: Neurosurgery;  Laterality: N/A;    Social History   Social History  . Marital Status: Married    Spouse Name: [name]  . Number of Children: 2  . Years of Education: N/A   Occupational History  .      Consultant   Social History Main Topics  . Smoking status: Never Smoker   . Smokeless tobacco: Never Used  . Alcohol Use: No  . Drug Use: No  . Sexual Activity: Not on file   Other Topics Concern  . Not on file   Social History Narrative   Lives with wife in a one story home.  Has 2 sons.  Retired Veterinary surgeon.  Education: Masters degree.    Family History  Problem Relation Age of Onset  . Diabetes Mother   . Hypertension Mother   . Pancreatic cancer Mother   . Stroke Mother   . Kidney failure Father   . Hypertension Sister   . Healthy Son     ROS: no fevers or chills, productive cough, hemoptysis, dysphasia, odynophagia, melena, hematochezia, dysuria, hematuria, rash, seizure activity, orthopnea, PND, pedal edema, claudication. Remaining systems are negative.  Physical Exam: Well-developed well-nourished in no acute distress.  Skin is warm and dry.  HEENT is normal.  Neck is supple.  Chest is clear to auscultation with  normal expansion.  Cardiovascular exam is regular rate and rhythm.  Abdominal exam nontender or distended. No masses palpated. Extremities show no edema. neuro grossly intact  ECG Sinus rhythm at a rate of 61. No ST changes.

## 2016-02-16 ENCOUNTER — Encounter: Payer: Self-pay | Admitting: Cardiology

## 2016-02-16 ENCOUNTER — Ambulatory Visit (INDEPENDENT_AMBULATORY_CARE_PROVIDER_SITE_OTHER): Payer: Medicare Other | Admitting: Cardiology

## 2016-02-16 VITALS — BP 106/64 | HR 61 | Ht 70.0 in | Wt 183.0 lb

## 2016-02-16 DIAGNOSIS — I2510 Atherosclerotic heart disease of native coronary artery without angina pectoris: Secondary | ICD-10-CM

## 2016-02-16 DIAGNOSIS — G060 Intracranial abscess and granuloma: Secondary | ICD-10-CM | POA: Diagnosis not present

## 2016-02-16 DIAGNOSIS — E7800 Pure hypercholesterolemia, unspecified: Secondary | ICD-10-CM | POA: Diagnosis not present

## 2016-02-16 NOTE — Assessment & Plan Note (Signed)
Management per primary care. 

## 2016-02-16 NOTE — Assessment & Plan Note (Signed)
Patient has now been off antibiotics for quite some time with no recurrent fevers. He has improved from a neurological standpoint. Previous echocardiogram showed no vegetations.

## 2016-02-16 NOTE — Assessment & Plan Note (Signed)
Patient with previously documented atrial flutter versus ectopic atrial tachycardia. He has had no further symptoms. Continue Toprol at 25 mg daily. I would be hesitant to anticoagulate given previous brain abscess and history of Osler Weber Rendu with AV malformations (and H/O bleeding).

## 2016-02-16 NOTE — Patient Instructions (Signed)
Your physician wants you to follow-up in: ONE YEAR WITH DR CRENSHAW You will receive a reminder letter in the mail two months in advance. If you don't receive a letter, please call our office to schedule the follow-up appointment.   If you need a refill on your cardiac medications before your next appointment, please call your pharmacy.  

## 2016-03-01 DIAGNOSIS — R351 Nocturia: Secondary | ICD-10-CM | POA: Insufficient documentation

## 2016-03-01 DIAGNOSIS — R350 Frequency of micturition: Secondary | ICD-10-CM | POA: Insufficient documentation

## 2016-04-13 ENCOUNTER — Encounter: Payer: Self-pay | Admitting: Pulmonary Disease

## 2016-05-03 ENCOUNTER — Ambulatory Visit: Payer: Medicare Other | Admitting: Pulmonary Disease

## 2016-06-01 ENCOUNTER — Ambulatory Visit: Payer: Medicare Other | Admitting: Pulmonary Disease

## 2016-06-07 ENCOUNTER — Encounter: Payer: Self-pay | Admitting: Pulmonary Disease

## 2016-06-07 ENCOUNTER — Ambulatory Visit (INDEPENDENT_AMBULATORY_CARE_PROVIDER_SITE_OTHER): Payer: Medicare Other | Admitting: Pulmonary Disease

## 2016-06-07 VITALS — BP 100/60 | HR 54 | Temp 97.5°F | Ht 70.0 in | Wt 185.2 lb

## 2016-06-07 DIAGNOSIS — E039 Hypothyroidism, unspecified: Secondary | ICD-10-CM

## 2016-06-07 DIAGNOSIS — G6289 Other specified polyneuropathies: Secondary | ICD-10-CM

## 2016-06-07 DIAGNOSIS — J3489 Other specified disorders of nose and nasal sinuses: Secondary | ICD-10-CM | POA: Diagnosis not present

## 2016-06-07 DIAGNOSIS — M150 Primary generalized (osteo)arthritis: Secondary | ICD-10-CM

## 2016-06-07 DIAGNOSIS — I780 Hereditary hemorrhagic telangiectasia: Secondary | ICD-10-CM

## 2016-06-07 DIAGNOSIS — I483 Typical atrial flutter: Secondary | ICD-10-CM

## 2016-06-07 DIAGNOSIS — N138 Other obstructive and reflux uropathy: Secondary | ICD-10-CM

## 2016-06-07 DIAGNOSIS — N401 Enlarged prostate with lower urinary tract symptoms: Secondary | ICD-10-CM

## 2016-06-07 DIAGNOSIS — F411 Generalized anxiety disorder: Secondary | ICD-10-CM

## 2016-06-07 DIAGNOSIS — K2100 Gastro-esophageal reflux disease with esophagitis, without bleeding: Secondary | ICD-10-CM

## 2016-06-07 DIAGNOSIS — E7800 Pure hypercholesterolemia, unspecified: Secondary | ICD-10-CM

## 2016-06-07 DIAGNOSIS — D126 Benign neoplasm of colon, unspecified: Secondary | ICD-10-CM

## 2016-06-07 DIAGNOSIS — I2510 Atherosclerotic heart disease of native coronary artery without angina pectoris: Secondary | ICD-10-CM

## 2016-06-07 DIAGNOSIS — M159 Polyosteoarthritis, unspecified: Secondary | ICD-10-CM

## 2016-06-07 NOTE — Progress Notes (Signed)
Subjective:    Patient ID: Gregory [name], male    DOB: [birth year], 75 y.o.   MRN: [id number]  HPI 75 y/o WM here for a follow up visit... he has multiple medical problems including:  Hx OWR syndrome;  Hx atypCP;  Hyperchol;  Hypothyroidism;  GERD/ Divertics/ IBS/ Colon polyps;  BPH/ BOO;  DJD/ LBP/ Plantar fasciitis;  Hx dizziness;  Anxiety & chronic persistant insomnia... 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  LABS 10/14:  FLP- on diet alone looks pretty good w/ LDL=111...  EKG 10/14 showed SBrady, rate57, otherw wnl;  2DEcho 10/14 showed normal LV size & wall thickness, norm LVF w/ EF=55-60%, Gr1DD, mildly thickened AoV leaflets, mild MR;  ETT 11/14 showed 21mn on treadmill, hypertensive BP response, rare PVC, no ischemia, O2 sats dropped to 87% w/ exercise...  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 Given this mild airflow obstruction we will start Rx w/ Dulera100- 2spBid   CXR 3/15 showed normal heart size, clear lungs, mild apical scarring, right basilar density c/w AVM based on CT report 2004, NAD..Marland KitchenMarland Kitchen EKG 3/15 showed NSR, rate87, low volt, NAD...  ADDENDUM> LABS 4/15 showed>  FLP- TChol 137, TG 30, HDL 36, LDL 95;  Chems- wnl;  CBC- anemic w/ Hg=11.3 & MCV=77;  TSH=9.26...   Ret for Iron panel (Fe=16- 4%sat), Ferritin (6.4), Stool cards (pending); then start FeSO4-3285mID w/ VitC500... Recheck CBC, Iron level in 6-8weeks...   Taking Synthroid112 regularly, therefore increase back to 12529md... Recheck TSH in 53mo82moe never returned.  ~  October 08, 2014:  64mo 653mo& [name] iMikki Santeetill working but  has cut back to 2d/wk; feeling better overall and most of his dyspnea symptoms resolved w/ FeSO4 7 return of his Hg to normal; energy is good & he exercises on treadmill, doing better on stairs, etc; [facility] the [facility] breathing improved back to baseline...  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    Hx CP>  Prev cardiac w/u by DrCreHilary Hertzneg, no recurrent CP complaints, he remains active but notes DOE w/ stairs, ok on level ground, 2DEcho & GXT were OK x O2 sat drop to 87% at peak exercise- ?incr shunting thru his RLL AVM...    Chol> Intol to Simva40 & Cres5 due to musc cramps; FLP 4/15 on diet alone showed TChol 137, TG 30, HDL 36, LDL 95...    Hypothy>  On Synthroid 125mcg71mow>  Labs 10/15 revealed TSH= 5.10 & clinically euthyroid, continue same...Marland KitchenMarland Kitchen  GI- GERD, IBS, Polyps> on Prilosec40Qd & Zofran/ Librax prn; followed by [facility] & seen 6/15> note reviewed (heme pos stool & anemia from epistaxis & NSAIDs); he had colonoscopy 3/12- neg x for hems & DrM rec f/u 63yr...    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    Anxiety/ Insomnia>  He has Chlorazepate7.5 for Prn  use, and Ambien10 to use for sleep Qhs... We reviewed prob list, meds, xrays and labs> see below for updates >> OK 2015 Flu shot today...   LABS 10/15:  CBC- wnl w/ Hg= 16.5, Fe=88 (23%sat), Ferritin=36;  TSH=5.10... REC>  Continue Iron supplement daily, and the Synthroid125/d...   ~  April 01, 2015:  57m53moV & [name] reports a good interval>  He has notes some intermittent & persistent nose bleeds due to his OWR & nasal obstruction, he uses nasal saline & will f/u w/ ENT- DrRosen... He also notes some discomfort in knees, hips, back but he can't take strong NSAIDs due to OWR & risk of GI bleeds; we discussed trial of Tramadol50 w/ tylenol to see how this works on a prn basis... His 3rd complaint is his chronic persistent insomnia- only sleeping 4h per night & difficulty getting back to sleep; we reviewed a few strategies to deal w/ this- he notes that Tranxene taken when he arises will help him go back to sleep 7 no hangover- this is a win-win strategy & he will continue... 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    Hx CP>  Prev cardiac w/u by DrCHilary Hertzs neg, no recurrent CP- he remains active but notes DOE w/ stairs, ok on level ground, 2DEcho & GXT were OK x O2 sat drop to 87% at peak exercise- ?incr shunting thru his RLL AVM...    Chol> Intol to Simva40 & Cres5 due to musc cramps; FLP 4/16 on diet alone showed TChol 146, TG 85, HDL 35, LDL 94...    Hypothy>  On Synthroid 125m157m now>  Labs 4/16 revealed TSH= 4.38 &  clinically euthyroid, continue same...    GI- GERD, IBS, Polyps> on Prilosec40Qd & Zofran/ Librax prn; [facility]> note reviewed (heme pos stool & anemia from epistaxis & NSAIDs); he had colonoscopy 3/12- neg x for hems & DrM rec f/u 67yrs72yr   GU>  Hx BPH w/ BOO on Rapaflo 8mg/d82mr DrEskridge; last seen 1/16 & doing satis & pt reports that his PSA was OK...    Ortho> on OTC meds prn; eval 12/11 by DrNorris for bilat leg pain & cramping, ?  min atrophy of left gastroc- he rec tonic water & neurology eval> he saw DrWillis 3/12 (note reviewed)> NCV was normal; EMG c/w mild S1 radiculopathy; he rec MRI- pt decided against the MRI;  He reports some discomfort in knees, hips, back & we decided on trial of Tramadol50...    Anxiety/ Insomnia>  He has Chlorazepate7.5 for Prn use, and Ambien10 to use for sleep Qhs... We reviewed prob list, meds, xrays and labs> see below for updates >>   CXR 4/16 showed norm heart size, clear lungs w/ some hyperinflation, RLL AVM noted w/o change...  EKG 4/16 revealed poor tracing- NSR, rate68, minor NSSTTWA, NAD...   LABS 4/16:  FLP- at goals on diet alone;  Chems- wnl;  CBC- wnl;  TSH=4.38...  ~  June 04, 2015:  30moROV & post hosp check>  BMikki Santeewas hosp 5/24 - 05/21/15 by [facility] after presenting w/ HA & right leg weakness; he tells me he saw his dentist for routine cleaning 2wks prior to this; eval revealed a brain lesion in left basal ganglionic region (tumor vs abscess); he had brain stereotactic bx 5/27 by DrNundkumar (left frontal craniectomy) & thick pus was removed- cultures +peptostreptococcus species (otherw neg aerobic bact cult, AFB, Fungi); he was seen by ID & placed on Rochepin, Vanco, Flagyl to be continued 194moost disch (they are checking levels and adjusting dose); also started on Keppra500Bid per NS;  Since disch he is feeling better- no f/c/s, notes sl dry cough/ no sput, & the cough is keeping him awake- TUSSIONEX called in & helps he says;   Sl nausea relieved by phenergan prn...  EXAM reveals Afeb, VSS, O2sat=94% on RA at rest;  HEENT- OWR telangiectasias w/o change;  Chest- few basilar crackles w/o wheezing, rhonchi, consolidation;  Heart- RR w/o m/r/g;  Abd- neg;  Ext- neg w/o c/c/e...  CT Head 5/16 showed 2 x 3 cm LEFT basal ganglia mass, likely tumor vs abscess, moderately extensive surrounding edema, small surgical clips within or near the posterior wall LEFT maxillary sinus/LEFT pterygopalatine fossa, in this patient with history of nose bleed (these are stable from prior scans)...  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   EKG 5/16 showed NSR, rate68, wnl, NAD...Marland KitchenMarland KitchenVen Dopplers 6/16 were neg for DVT  LABS- reviewed> only pos cult= peptostreptococcus from brain bx;  Chems- ok x Na & Ca sl low;  CBC- ok x WBC 14=>11... 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 Given IS, refill Phenergan & Tussionex...  ~  July 31, 2015:  17mo34mo & add-on appt requested for several complaints>  [name] finished his antibiotic Rx for brain abscess (likely due to dental work several weeks prior to onset of neuro symptoms in [id number] DrCampbell has arranged for a follow up CT Brain 08/11/15 (this will be 3 wks off antibiotics);  He had an  episode of tachycardia (?AFlutter  w/ rvr per EMS) w/ HR incr to 170's- went to ER 7/13 (but was in NSR) & he is currently wearing a Holter monitor per DrCrenshaw w/ follow-up Cards appt sched for next week...        He presents today w/ several complaints>  1) hoarseness: note this is intermittent, off & on, swallowing OK, denies indigestion/ reflux/ etc; he is on Protonix40 w/ his hx of OWR, plus zofran/ librax; we reviewed antireflux regimen & agreed to refer him to ENT for a look at his larynx & their opinion.Marland KitchenMarland Kitchen  2) c/o numbness in toes and bottom of his feet bilat for the last week or so w/ some discomfort while walking; no culprit meds on his list & we offered referral to Neuro for a neuropathy evaluation.Marland KitchenMarland Kitchen  3) he is c/o weight loss> weight today is 167#, 5'10" Tall, BMI=24 which is wnl; last wt=182# 80moago for a 15# wt loss, appetite is good & intake unchanged he says; we discussed caloric intake and nutritional supplements; we also checked labs> Chems- wnl, BS=103, A1c=5.7;  CBC- wnl; Note- he is on Synthroid1259m/d and TSH= 3.28 when checked 07/03/15...   CXR 07/02/15 showed norm heart size, clear lungs, NAD...   CT Head 08/11/15 showed left frontal craniotomy, resolved abscess in left external capsule w/ decr edema and decr enhancement...  2DEcho 08/14/15 showed norm LV size & function w/ EF=55-60%, no regional wall motion abn, Gr2DD, trace MR & TR  ~  October 02, 2015:  39m61moV & BobMikki Santees gained 4# up to #171# today on Ensure 1can/d:  We discussed the need for AMOXICILLIN 2gm 1H prior to dentist...    He saw ENT (we do not have note) & pt indicates that his cords were sl thinned, they offered injection rx but his hoarseness is improved & holding off...     He saw DrCampbell, ID on 8/1> f/u anaerobic brain abscess after dental work & presented w/ right sided weakness (resolved); he finished 39mo36moLevaquin & Flagyl, no f/c/s, neuro exam was WNL, & f/u scan 8/22 w/ resolved abscess; pt tells me DrCampbell signed off...     He saw  DrCrenshaw 8/18 for Cards> f/u PAFlutter episode after disch from hosp & converted spont, they did 30d Holter- all NSR/ STachy, on ToprolXL25; they rechecked his 2DEcho- normal valves, Gr2DD...  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 Overall, these findings are severe in degree electrically with respect to sensory responses. 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  We reviewed prob list, meds, xrays and labs> NOTE: THEY WANT ANOTHER CT HEAD IN [id number] ~  January 01, 2016:  68mo 39mo& [name] iMikki Santeetable- persistent neuropathy symptoms (?related to Flagyl Rx?), c/o cold hands, speech difficulty (tongue feels bigger), ?memory problem- offered speech path assessment but he notes Neuro f/u appt in several weeks & wants to wait;  Hoarseness improved w/ MMW;  DrCampbell, ID has signed off- pt & wife want another CT Head to f/u the prev brain abscess &  we will order;  He knows to take AMOX prior to any procedures (due to his OWR)... No other interval visits in the last 69mo he requests mult med refills- ok...  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    Hx CP, episode of PAFlutter> w/u by DHilary Hertzwas neg, no recurrent CP or arrhythmia- he remains active but notes DOE w/ stairs, ok on level ground, 2DEcho & GXT were OK x O2 sat drop to 87% at peak exercise- ?incr shunting thru his RLL AVM...    Chol> Intol to Simva40 & Cres5 due to musc cramps; FLP 4/16 on diet alone showed TChol 146, TG 85, HDL 35, LDL 94...    Hypothy>  On Synthroid 1240m/d now>  Labs 7/16 revealed TSH= 3.28 & clinically euthyroid, continue same...    GI- GERD, IBS, Polyps> on Protonix40Qd & Zofran/ Librax prn; [facility] note reviewed (heme pos stool & anemia from epistaxis & NSAIDs); he had colonoscopy 3/12- neg x for hems & DrM rec f/u 5y44yr.    GU>  Hx BPH w/ BOO on Rapaflo 8mg80mper DrEskridge; last seen 1/16 & doing satis & pt reports that his PSA was OK...    Ortho> on OTC meds prn; eval 12/11 by DrNorris for bilat leg pain & cramping, ?min atrophy of left gastroc- he rec tonic water & neurology eval> he saw DrWillis 3/12 (note reviewed)> NCV was normal; EMG c/w mild S1 radiculopathy; he rec MRI- pt decided against the MRI;  He reports some discomfort in knees, hips, back & we decided on trial of Tramadol50...    Anxiety/ Insomnia>  He has Chlorazepate7.5 for Prn use, and Ambien10 to use for sleep Qhs... EXAM reveals Afeb, VSS, O2sat=91% on RA at rest;  Wt is up 8# to 179#; HEENT- OWR telangiectasias w/o change;  Chest- few basilar crackles w/o wheezing, rhonchi, consolidation;  Heart- RR w/o m/r/g;  Abd- neg;  Ext- neg w/o c/c/e...  LABS 1/17>  BMet   CT Brain 1/17> pending IMP/PLAN>>  [name] mult somatic complaints lingering & he is still quite anxious; he wants another CT scan & we will order this; he is encouraged to continue his regular f/u appts w/ his specialists & we will recheck in 4mon378monthADDENDUM>>  CT Brain1/17/17 w/ contrast showed  no acute changes, area of hypoattenuation in left ext capsule & lentiform nucleus extending to the white matter of the centrum semiovale is improved from 07/2015, no abn post contrast enhacement- residual infection is unlikely... He will decide if he wants to repeat the scan in ~29mo..678mo  June 07, 2016:  78mo RO67mopulmonary/medical follow up>  [name]  IsMikki Santeeo persistent neuropathy symptoms and right upper lip is numb, otherw feeling well/ energy good/ no f/c/s, no cough/ no sput "except at night" he says;  Notes that he'll wake 1-2 x per week & wakes him up => we discussed reflux & a vigorous antireflux regimen (PPI before dinner,  NPO after dinner, elev HOB)... 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    Hx CP, episode of PAFlutter> w/u by Hilary Hertz was neg, no recurrent CP or arrhythmia- he remains active but notes DOE w/ stairs, ok on level ground, 2DEcho & GXT were OK x O2 sat dropped to 87% at peak exercise- ?incr shunting thru his RLL AVM.Marland KitchenMarland Kitchen Seen by Cards 2/17> OWR, AFlutter, HL; adm w/ brain abscess 5/16 (predisposed by his OWR?); on ToprolXL25, tol well, no recurrent arrhythmia...     Chol> Intol to Simva40 & Cres5 due to musc cramps; FLP 6/17 on diet alone showed TChol 155, TG 60, HDL 42, LDL 101...    Hypothy>  On Synthroid 170mg/d now>  Labs 6/17 revealed TSH= 2.41 & clinically euthyroid, continue same...    GI- GERD, IBS, Polyps> on Protonix40Qd & Zofran/ Librax prn; [facility] note reviewed (heme pos stool  & anemia from epistaxis & NSAIDs); he had colonoscopy 3/12- neg x for hems & DrM rec f/u 542yr..    GU>  Hx BPH w/ BOO prev on Rapaflo 45m51m per DrEskridge; urodynamics showed small capacity hypersens bladder- note 12/2015 reviewed...    Ortho/ Neuro- neuropathy, hx brain abscess> on OTC meds prn; eval 12/11 by DrNorris for bilat leg pain & cramping, ?min atrophy of left gastroc- he rec tonic water & neurology eval> he saw DrWillis 3/12 (note reviewed)> NCV was normal; EMG c/w mild S1 radiculopathy; he rec MRI- pt decided against the MRI;  He reports some discomfort in knees, hips, back & we decided on trial of Tramadol50... He saw DrPatel 12/2015- hx brain abscess 5/16, followed by neuropathy symptoms, and neuro notes reviewed...     Anxiety/ Insomnia>  He has Chlorazepate7.5 for Prn use, and Ambien10 to use for sleep Qhs... EXAM reveals Afeb, VSS, O2sat=91% on RA at rest;  Wt is up 8# to 179#; HEENT- OWR telangiectasias w/o change;  Chest- few basilar crackles w/o wheezing, rhonchi, consolidation;  Heart- RR w/o m/r/g;  Abd- neg;  Ext- neg w/o c/c/e...  LABS 06/09/16>  FLP- all parameters at goals on diet alone;  Chems- wnl;  CBC- wnl;  TSH=2.41;  PSA=1.58 IMP/PLAN>>  We reviewed his reflux symptoms and advised a vigorous antireflux regimen w/ Protonix40 before dinner, NPO after dinner, Elev HOB; otherw same meds 7 [name] in 27mo15moner prn...           Problem List:   BRAIN ABSCESS  >>  [name] hosp 5/24 - 05/21/15 by [facility] after presenting w/ HA & right leg weakness; he tells me he saw his dentist for routine cleaning 2wks prior to this; eval revealed a brain lesion in left basal ganglionic region (tumor vs abscess); he had brain stereotactic bx 5/27 by DrNundkumar (left frontal craniectomy) & thick pus was removed- cultures +peptostreptococcus species (otherw neg aerobic bact cult, AFB, Fungi); he was seen by ID & placed on Rochepin, Vanco, Flagyl to be continued 80mo 380mo disch (they are checking levels  and  adjusting dose); also started on Keppra500Bid per NS... ~  6/16:  Since disch he is feeling better- no f/c/s, notes sl dry cough/ no sput, & the cough is keeping him awake- TUSSIONEX called in & helps he says;  Sl nausea relieved by phenergan prn...  ~  8/16:  He was checked by DrCampbell, ID- they stopped antibiotics and rec f/u CT Brain 08/11/15= 3 wks off the antibiotic rx => resolved abscess. ~  1/17:  CT Brain - pending  OTHER DISEASES OF NASAL CAVITY AND SINUSES - Hx of Osler-Weber-Rendue syndrome/ hereditary telangiectasias... he's had numerous nose bleeds and surgery by Etter Sjogren, now followed by Barbaraann Faster... left nares is occluded w/ skin graft- no lumen... this is quite uncomfortable to the patient but he's been told there is nothing further that can be done for this... ~  3/11:  notes occas nosebleeds but he is able to handle them... ~  4/12:  No diff in his pattern of mild nose bleeds & he denies any severe epistaxis episodes... 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 ~  4/14:  He notes infreq bleeding now w/ saline etc... 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 Dyspnea w/ O2 desat w/ exercise >>  ~  12/14:  PFTs showed FVC=3.98 (89%), FEV1=2.72 (79%), %1sec=68, and mid-flows= 51% predicted; c/w mild obstructive dis & we decided on a trial of Dulera100-2spBid ~  4/15:  His breathing is improved w/ the  [facility] and exercise program... ~  10/15:  His breathing is back to baseline after FeSO4 rx for anemia & ret of Hg to normal; he has stopped the [facility] & has it handy for prn use... ~  4/16:  prev on Dulera100-2spBid, now just prn; breathing improved w/ exercise; O2 sat= 93% on RA today; c/o dry throat & encouraged to use lozenges etc... ~  CXR 4/16 showed norm heart size, clear lungs w/ some hyperinflation, RLL AVM noted w/o change... 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 known GI telangiectasia as well without signif GI bleeding in the past- followed by [facility]... he's also has skin telangiectasis w/ prev laser therapy at [facility]... extensive nasal problems as above... ~  labs 2/09 showed Hg= 17.0 ~  labs 2/10 showed Hg= 16.4 ~  CXR 3/11 is chr incr markings esp RLL- no change, NAD...  ~  Labs 6/11 showed Hg= 15.9 ~  Labs 4/12 showed Hg= 16.0 ~  CXR 10/12 showed mild biapical scarring, RLL AVM, NAD... ~  Labs 4/13  showed Hg= 15.2 ~  CXR 8/13 showed heart at upper lim of norm, increased markings & apical scarring, chr incr markings in RLL- AVM, NAD.[name]. 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 ~  CXR 3/15 showed normal heart size, clear lungs, mild apical scarring, right basilar density c/w AVM based on CT report 2004, NAD.[name]. ~  CXR 4/16 showed norm heart size, clear lungs w/ some hyperinflation, RLL AVM noted w/o change... ~  5/16:  Hosp w/ right sided weakness, left sided basal ganglionic  lesion on CT Head=> eval revealed brain abscess w/ +peptostreptococcus, treated w/ Roceph, Vanco, Flagyl & followed by ID...  Hx of CHEST PAIN (ICD-786.50) - seen Sep09 w/ several recent bouts of CP while working in yard, washing car, etc... resolved w/ rest & Protonix... EKG showed WNL, [facility] was neg (no ischemia or infarction and EF= 61%)... Cardiac eval DrCrenshaw w/ 2DEcho 10/09 showing norm LV- no regional wall motion abn, norm LVF w/ EF= 60%, mild dil RA/ RV;  and norm MYOVIEW (no ischemia or infarction, EF= 58%)... ~  10/14: repeat cardiac eval by DrCrenshaw due to [place] w/ stairs; EKG 10/14 showed SBrady, rate57, otherw wnl;  2DEcho 10/14 showed normal LV size & wall thickness, norm LVF w/ EF=55-60%, Gr1DD, mildly thickened AoV leaflets, mild MR;  ETT 11/14 showed 43mn on treadmill, hypertensive BP response, rare PVC, no ischemia, O2 sats dropped to 87% w/ exercise ~  EKG 3/15 showed NSR, rate87, low volt, NAD..Marland KitchenMarland Kitchen  Episode of AFLUTTER >> this occurred 06/2015 & notes from Cards ([facility] SRichardson Doppare reviewed); placed on lose dose TOPROL-XL & titrated up to 211md... Holter monitor placed and results pending...   HYPERCHOLESTEROLEMIA, MILD (ICD-272.0) - now on CRESTOR 79m60m>  Prev Rx w/ Simva40 but developed muscle cramping which resolved off the med. ~  FLP 6/08 showed TChol 208, TG 75, HDL 34, LDL 139... he preferred diet Rx- ~  FLP 2/09 showed TChol 172, TG 69, HDL 30, LDL 129... [place] ~  [place] Crestor10 showed TChol 91, TG 49, HDL 28, LDL 54... rec- keep same. ~  FLP 2/10 on Crestor10 showed TChol 108, TG 56, HDL 31, LDL 66 ~  9/10:  we discussed changing Crestor10 to Simvastatin40 for $$$ reasons... ~  FL[place]11 on Simva40 showed TChol 105, TG 59, HDL 39, LDL 54... continue same. ~  FLPB[place]12 on Simva40 showed TChol 104, TG 62, HDL 29, LDL 63... C/o severe muscle cramps & wants to stop for 65mo58moamping resolved! ~  Referred  to [facility] & they restarted his Crestor at 79mg/28mtol well so far... ~  FLP 10/12 on Cres5 showed TChol 114, TG 40, HDL 36, LDL 70 ~  FLP 2/13 on Cres5 showed TChol 124, TG 21, HDL 40, LDL 80 ~  FLP 4/14 on Cres5 showed TChol 119, TG 44, HDL 38, LDL 72  ~  6/14: pt stopped Cres5 on his own due to leg cramps & decided to hold statins for now w/ repeat FLP off meds in several months... ~  FLP 10/14 on diet alone showed TChol 155, TG 29, HDL 38, LDL 111  ~  FLP 4/15 on diet alone showed TChol 137, TG 30, HDL 36, LDL  95 ~  FLP 4/16 on diet alone showed TChol 146, TG 85, HDL 35, LDL 94  HYPOTHYROIDISM - on SYNTHROID 168mg/d now; prev 1240m/d dose was decreased 4/12... ~  labs 2/09 showed TSH= 0.69...  8/09 showed TSH= 0.44... ~  labs 2/10 showed TSH= 0.68 ~  labs 3/11 showed TSH= 0.52 ~  Labs 4/12 on Synthroid125 showed TSH= 0.33... We decided to decr the dose to 112/d. ~  Labs 4/13 on Levo112 showed TSH= 4.60 ~  Labs 4/14 on Levo112 showed TSH= 5.46 ~  Labs 4/15 on Levo112 showed TSH= 9.26 and we decided to incr dose back to 12564md... ~  Labs 4/16 on Levo125 showed TSH= 4.38  GERD, IRRITABLE BOWEL SYNDROME, COLONIC POLYPS - [facility] (he tried OTC Zantac 62m27m his own)... also [place]  [place] ~  colonoscopy 1/04 w/ divertics, diminutive rectal polyp (adenomatous), hems...  ~  f/u colon 1/07 showed no recurrent polyps... ~  [facility] HYPERTROPHY PROSTATE W/UR OBST & OTH LUTS (ICD-600.01) - followed by DrRDaTGGYIRSWAPAFLO 8mg/d10mtopped Flomax due to side effect- ED)... doing well without LTOS, just complains of nocturia x 1-2 ~  labs 2/09 showed PSA= 0.76 ~  labs 2/10 showed PSA= 0.90 ~  2011 PSA checked by DrDavis (pt states it was OK)... ~  Labs 4/12 showed PSA= 1.22 ~  Labs 4/13 showed PSA= 1.31 ~  10/13: presents c/o incr freq, nocturia x2-4, & weak stream despite  Rapaflo; he is intol to Flomax; refer to Urology for further eval=> seen by DrEskridge, no changes made... ~  1/15: f/u by Urology, DrEskridge> BPH, LUTS, hypersens unstable bladder; on Rapaflo8 & doing satis, PSA reported to be 2.34...  LEFT ARM & SHOULDER PAIN >> he was checked by GboroOLacie Scottsot improving he says; [facility] reviewed.  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 ~  9/10:  requesting Rx for Robaxin for muscle spasm as needed.  PLANTAR FASCIITIS (ICD-728.71) - c/o pain in his feet secondary to plantar fasciitis eval & [facility] from [facility] w/ Celebrex & shots... ~  9/10: we discussed [facility].  Hx NOCTURNAL LEG CRAMPS >>  ~  This initially occurred several yrs ago while on Simvastatin & the cramps resolved off this med; he was subseq placed on Cres5 & tol well... ~  6/14: he indicates that noct leg cramps returned x1wk & he stopped the Cres5 w/ improvement; he wants to leave off the statins for now; rec to try tonic water/ yellow mustard prn...  Hx of DIZZINESS (ICD-780.4) - sudden episode dizziness Sep09 working at [employer] w/[employer] spinning- lasted 10min 39mresolved spontaneously, felt light headed afterwards... no LOC, syncope, seizure activity, etc... no CP/ palpit/ SOB/ etc... there were several EMTs in the restaurant and they checked him out- stable and transported to ER... 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 treaMarland Kitchened w/ MECLIZINE w/ improvement.  ANXIETY (ICD-300.00) & INSOMNIA, CHRONIC (ICD-307.42) - he uses CHLORAZEPATE 7.5mg Prn5mAMBIEN10mg/Qhs48m chronic persistant insomnia...  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 ~  10/13: he reports SCCa removed  from forehead recently &  referred for Moh's...  ANEMIA >>  ~  LABS 4/13 showed Hg= 15.2, MCV=91 ~  LABS 4/14 showed Hg= 14.5, MCV=84 ~  LABS 3/15 showed Hg= 13.6, MCV=78 ~  LABS 4/15 showed Hg= 11.3, MCV= 77, Iron studies showed Fe= 16 (3.6%sat), Ferritin= 6.4, & stool cards neg; Rx w/ FeSO4... ~  Labs 10/15 showed Hg= 16.5, MCV= 91, Fe=88 (23%sat), Ferritin= 36...  ~  Labs 4/16 showed Hg= 16.0  Health Maintenance: ~  GI:  followed by [facility] & up to date on colon etc... (last 1/07 & 79yrf/u due 1/12). ~  GU:  followed by DrRDavis in past; PSAs have all been wnl... ~  Immunizations:  he gets yearly Flu vaccinations in the fall;  OK Pneumovax 03/12/10 at age-[id number]     Past Surgical History  Procedure Laterality Date  . Varicocelectomy  1991    Dr. RTresa Endo . 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 Other surgical history      pulse laser for facial telangiectasias inthe past  . Excision left wrist ganglian/ myxoid cyst  05-30-2009  . Cardiovascular stress test  01-14-2003    NO ISCHEMIA / EF 61%/ NORMAL LE WALL MOTION  . Transthoracic echocardiogram  10-17-2008   DR CRENSHAW    NORMAL LVF/ EF 60%/  MILDLY DILATED RIGHT ATRIUM/ VENTRICULE  . 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 HRenda Rolls . Brain biopsy Left 05/16/2015    Procedure: Stereotactic Left Brain Biopsy with Brain Lab;  Surgeon: NConsuella Lose MD;  [place];  Service: Neurosurgery;  Laterality: Left;  Stereotactic Left Brain biopsy with brainlab  . Application of cranial navigation N/A 05/16/2015    Procedure: APPLICATION OF CRANIAL NAVIGATION;  Surgeon: NConsuella Lose MD;  [place] [facility];  Service: Neurosurgery;  Laterality: N/A;    Outpatient Encounter Prescriptions as of 01/01/2016  Medication Sig  . acetaminophen (TYLENOL) 325 MG tablet Take 650 mg by mouth daily as needed (pain).  . Cholecalciferol (VITAMIN D3) 2000 UNITS TABS Take 2,000 Units by  mouth daily.  . clidinium-chlordiazePOXIDE (LIBRAX) 5-2.5 MG capsule Take 1 capsule by mouth 3 (three) times daily as needed.  . clorazepate (TRANXENE) 7.5 MG tablet TAKE 1 TABLET BY MOUTH 3 TIMES A DAY AS NEEDED  . Diphenhyd-Hydrocort-Nystatin (FIRST-DUKES MOUTHWASH) SUSP 1 tsp gargle and swallow four times daily  . levothyroxine (SYNTHROID, LEVOTHROID) 125 MCG tablet TAKE 1 TABLET (125 MCG TOTAL) BY MOUTH DAILY BEFORE BREAKFAST.  .Marland Kitchenloratadine (CLARITIN) 10 MG tablet Take 10 mg by mouth daily.  . Magnesium 250 MG TABS Take 250 mg by mouth at bedtime.   . meclizine (ANTIVERT) 25 MG tablet Take 1 tablet (25 mg total) by mouth daily as needed for dizziness.  . metoprolol succinate (TOPROL XL) 25 MG 24 hr tablet Take 1 tablet (25 mg total) by mouth daily.  . Multiple Vitamin (MULTIVITAMIN WITH MINERALS) TABS tablet Take 1 tablet by mouth daily. Centrum  . ondansetron (ZOFRAN) 4 MG tablet Take 1 tablet (4 mg total) by mouth every 8 (eight) hours as needed for nausea or vomiting.  . pantoprazole (PROTONIX) 40 MG tablet Take 1 tablet (40 mg total) by mouth daily.  . silodosin (RAPAFLO) 8 MG CAPS capsule Take 1 capsule (8 mg total) by mouth at bedtime.    Allergies  Allergen Reactions  . Nsaids  Other (See Comments)    Cannot take-per MD  . Other Other (See Comments)    All blood thinners-cannot take per MD  . Aspirin Other (See Comments)    nosebleeds  . Statins Other (See Comments)    Weakness, myalgias     Current Medications, Allergies, Past Medical History, Past Surgical History, Family History, and Social History were reviewed in [employer] record.    Review of Systems       See HPI - other systems neg except as noted... The patient denies anorexia, fever, weight loss, weight gain, vision loss, decreased hearing, hoarseness, chest pain, syncope, dyspnea on exertion, peripheral edema, prolonged cough, headaches, hemoptysis, abdominal pain, melena, hematochezia,  severe indigestion/heartburn, hematuria, incontinence, muscle weakness, suspicious skin lesions, transient blindness, difficulty walking, depression, unusual weight change, abnormal bleeding, enlarged lymph nodes, and angioedema.     Objective:   Physical Exam      WD, WN, 75 y/o WM in NAD... 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 GENERAL:  Alert & oriented; pleasant & cooperative... HEENT:  [place]/AT, EOM-wnl, PERRLA, EACs-clear, TMs-wnl, NOSE- occluded left nares from skin grafts, MOUTH- mucosal telangiectasias.  NECK:  Supple w/ fairROM; no JVD; normal carotid impulses w/o bruits; no thyromegaly or nodules palpated; no lymphadenopathy. CHEST:  Clear to P & A; without wheezes/ rales/ or rhonchi. HEART:  Regular Rhythm; without murmurs/ rubs/ or gallops. ABDOMEN:  Soft & nontender; normal bowel sounds; no organomegaly or masses detected. EXT: without deformities, mild arthritic changes; no varicose veins/ venous insuffic/ or edema. NEURO:  CN's intact; motor testing normal; sensory testing normal; gait normal & balance OK. DERM:  mult telangiectasias...  RADIOLOGY DATA:  Reviewed in the EPIC EMR & discussed w/ the patient...  LABORATORY DATA:  Reviewed in the EPIC EMR & discussed w/ the patient...   Assessment & Plan:    BRAIN ABSCESS >> [facility] 5/[facility], followed by ID, DrCampbell, finished antibiotic rx 07/2015 and f/u CT Brain=> resolved abscess... He will need antibiotic prophylaxis prior to future dental work...  Hx Cough, Dyspnea on exertion, mild COPD >> as above, prev on Dulera100- 2sp Bid & exercise program- improved; we will continue to monitor his status & O2 sats; Dyspnea improved w/ Fe for anemia & ret of Hg to norm... He has Tussionex prn cough... 8/16> c/o intermittent hoarseness & referred to ENT for eval of cords => we don't have note but pt indicates sl thinning of cords and they offered injections but he's holding off...  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     Hx CP, AFlutter>  Prev cardiac w/u by DrCrenshaw was neg, no recurrent CP complaints, but he notes DOE w/ stairs & DrCrenshaw repeated 2DEcho & ETT as above... Episode of PAFlutter 06/2015=> holter monitor per cards- pending & pt improved on ToprolXL25...      Chol>  Off statins due to recurrent cramps, on diet alone & FLP is reasonable...     Hypothy>  On Synthroid 123mg/d now>  He is clinically & biochem euthyroid...     [facility]>  [facility]-  neg x for hems & DrM rec f/u 14yr...     GU>  Hx BPH w/ BOO & followed by DrEskridge on Rapaflo 823md & stable...     Ortho>  See above- prev stable on OTC analgesics as needed, but recent incr symptoms- try Tramadol50 prn...  LEG CRAMPS >> he is quite sure they are from his statin Rx & we decided to leave these off for now, try to control on diet/ exercise & continued monitoring... 8/16> c/o numbness in toes & bottom of feet 8/16>  Refer to Neuro for neuropathy eval...     Anxiety/ Insomnia>  He has Chlorazepate for Prn use, and Ambien to use for sleep Qhs...  Anemia>  On FeSO4 supplement & Hg now back to normal, dyspnea ret to baseline...   MULT MEDICATION REFILLS WERE PROVIDED TODAT AT Pt's REQUEST >>  Patient's Medications  New Prescriptions   No medications on file  Previous Medications   ACETAMINOPHEN (TYLENOL) 325 MG TABLET    Take 650 mg by mouth daily as needed (pain).   CHOLECALCIFEROL (VITAMIN D3) 2000 UNITS TABS    Take 2,000 Units by mouth daily.   CLIDINIUM-CHLORDIAZEPOXIDE (LIBRAX) 5-2.5 MG CAPSULE    Take 1 capsule by mouth 3 (three) times daily as  needed.   CLORAZEPATE (TRANXENE) 7.5 MG TABLET    TAKE 1 TABLET BY MOUTH 3 TIMES A DAY AS NEEDED   DIPHENHYD-HYDROCORT-NYSTATIN (FIRST-DUKES MOUTHWASH) SUSP    1 tsp gargle and swallow four times daily   LEVOTHYROXINE (SYNTHROID, LEVOTHROID) 125 MCG TABLET    TAKE 1 TABLET (125 MCG TOTAL) BY MOUTH DAILY BEFORE BREAKFAST.   LORATADINE (CLARITIN) 10 MG TABLET    Take 10 mg by mouth daily.   MAGNESIUM 250 MG TABS    Take 250 mg by mouth at bedtime.    MECLIZINE (ANTIVERT) 25 MG TABLET    Take 1 tablet (25 mg total) by mouth daily as needed for dizziness.   METOPROLOL SUCCINATE (TOPROL XL) 25 MG 24 HR TABLET    Take 1 tablet (25 mg total) by mouth daily.   MIRABEGRON ER (MYRBETRIQ) 25 MG TB24 TABLET    Take 25 mg by mouth daily.   MULTIPLE VITAMIN (MULTIVITAMIN WITH MINERALS) TABS TABLET    Take 1 tablet by mouth daily. Centrum   PANTOPRAZOLE (PROTONIX) 40 MG TABLET    Take 1 tablet (40 mg total) by mouth daily.   SILODOSIN (RAPAFLO) 8 MG CAPS CAPSULE    Take 1 capsule (8 mg total) by mouth at bedtime.  Modified Medications   No medications on file  Discontinued Medications   No medications on file

## 2016-06-07 NOTE — Patient Instructions (Signed)
Today we updated your med list in our EPIC system...    Continue your current medications the same...  We decided to start a vigorous antireflux regimen>>    Take your PROTONIX 40mg  about 30 min before the evening meal...    Do not eat or drink much after dinner so you stomach is totally empty by bedtime...    Elevate the head of your bed on 6" blocks...  Please return to our lab one morning this week for your FASTING blood work...    We will contact you w/ the results when available...   Call for any questions...  Let's plan a follow up visit in 90mo, sooner if needed for problems.Marland KitchenMarland Kitchen

## 2016-06-09 ENCOUNTER — Other Ambulatory Visit (INDEPENDENT_AMBULATORY_CARE_PROVIDER_SITE_OTHER): Payer: Medicare Other

## 2016-06-09 DIAGNOSIS — N401 Enlarged prostate with lower urinary tract symptoms: Secondary | ICD-10-CM

## 2016-06-09 DIAGNOSIS — N138 Other obstructive and reflux uropathy: Secondary | ICD-10-CM

## 2016-06-09 DIAGNOSIS — D126 Benign neoplasm of colon, unspecified: Secondary | ICD-10-CM

## 2016-06-09 DIAGNOSIS — E7800 Pure hypercholesterolemia, unspecified: Secondary | ICD-10-CM | POA: Diagnosis not present

## 2016-06-09 DIAGNOSIS — E039 Hypothyroidism, unspecified: Secondary | ICD-10-CM | POA: Diagnosis not present

## 2016-06-09 LAB — COMPREHENSIVE METABOLIC PANEL
ALBUMIN: 4.3 g/dL (ref 3.5–5.2)
ALK PHOS: 58 U/L (ref 39–117)
ALT: 14 U/L (ref 0–53)
AST: 16 U/L (ref 0–37)
BILIRUBIN TOTAL: 0.6 mg/dL (ref 0.2–1.2)
BUN: 17 mg/dL (ref 6–23)
CO2: 28 mEq/L (ref 19–32)
CREATININE: 1.2 mg/dL (ref 0.40–1.50)
Calcium: 9.3 mg/dL (ref 8.4–10.5)
Chloride: 101 mEq/L (ref 96–112)
GFR: 62.97 mL/min (ref >60.00)
GLUCOSE: 95 mg/dL (ref 70–99)
POTASSIUM: 4.5 meq/L (ref 3.5–5.1)
SODIUM: 135 meq/L (ref 135–145)
TOTAL PROTEIN: 6.6 g/dL (ref 6.0–8.3)

## 2016-06-09 LAB — CBC WITH DIFFERENTIAL/PLATELET
BASOS ABS: 0 10*3/uL (ref 0.0–0.1)
Basophils Relative: 0.5 % (ref 0.0–3.0)
EOS PCT: 1.6 % (ref 0.0–5.0)
Eosinophils Absolute: 0.1 10*3/uL (ref 0.0–0.7)
HCT: 39.6 % (ref 39.0–52.0)
HEMOGLOBIN: 13.2 g/dL (ref 13.0–17.0)
LYMPHS ABS: 3.1 10*3/uL (ref 0.7–4.0)
LYMPHS PCT: 42.9 % (ref 12.0–46.0)
MCHC: 33.2 g/dL (ref 30.0–36.0)
MCV: 80.5 fl (ref 78.0–100.0)
MONOS PCT: 11 % (ref 3.0–12.0)
Monocytes Absolute: 0.8 10*3/uL (ref 0.1–1.0)
NEUTROS PCT: 44 % (ref 43.0–77.0)
Neutro Abs: 3.1 10*3/uL (ref 1.4–7.7)
Platelets: 294 10*3/uL (ref 150.0–400.0)
RBC: 4.92 Mil/uL (ref 4.22–5.81)
RDW: 14.9 % (ref 11.5–15.5)
WBC: 7.1 10*3/uL (ref 4.0–10.5)

## 2016-06-09 LAB — LIPID PANEL
CHOLESTEROL: 155 mg/dL (ref 0–200)
HDL: 41.8 mg/dL (ref >39.00)
LDL Cholesterol: 101 mg/dL — ABNORMAL HIGH (ref 0–99)
NONHDL: 113.12
Total CHOL/HDL Ratio: 4
Triglycerides: 60 mg/dL (ref 0.0–149.0)
VLDL: 12 mg/dL (ref 0.0–40.0)

## 2016-06-09 LAB — TSH: TSH: 2.41 u[IU]/mL (ref 0.35–4.50)

## 2016-06-09 LAB — PSA: PSA: 1.58 ng/mL (ref 0.10–4.00)

## 2016-06-10 ENCOUNTER — Telehealth: Payer: Self-pay | Admitting: Pulmonary Disease

## 2016-06-10 NOTE — Telephone Encounter (Signed)
Notes Recorded by Christie Beckers, RN on 06/10/2016 at 11:37 AM [facility] x 1 Notes Recorded by Noralee Space, MD on 06/10/2016 at 8:47 AM Please notify patient> LABS look good... FLP looks good on die4t alone- all parameters at goals... Chems, CBC, Thyroid, & PSA are all WNL.[name]. (I sent copy to Urology- DrEskridge)   Damaris Schooner with pt, aware of results/recs.  Nothing further needed.

## 2016-08-25 ENCOUNTER — Ambulatory Visit (INDEPENDENT_AMBULATORY_CARE_PROVIDER_SITE_OTHER): Payer: Medicare Other | Admitting: Neurology

## 2016-08-25 ENCOUNTER — Encounter: Payer: Self-pay | Admitting: Neurology

## 2016-08-25 VITALS — BP 108/68 | HR 68 | Ht 70.0 in | Wt 186.6 lb

## 2016-08-25 DIAGNOSIS — I2510 Atherosclerotic heart disease of native coronary artery without angina pectoris: Secondary | ICD-10-CM | POA: Diagnosis not present

## 2016-08-25 DIAGNOSIS — H5462 Unqualified visual loss, left eye, normal vision right eye: Secondary | ICD-10-CM

## 2016-08-25 DIAGNOSIS — G060 Intracranial abscess and granuloma: Secondary | ICD-10-CM | POA: Diagnosis not present

## 2016-08-25 DIAGNOSIS — G620 Drug-induced polyneuropathy: Secondary | ICD-10-CM

## 2016-08-25 NOTE — Patient Instructions (Signed)
Based on your testing with Dr. Sabra Heck, we can decide the next step.

## 2016-08-25 NOTE — Progress Notes (Signed)
Follow-up Visit   Date: 08/25/16   Gregory [name] MRN: [id number] DOB: 03-01-1941   Interim History: Gregory [name] is a 75 y.o. right-handed Caucasian male with hypothyroidism, hyperlipidemia, Osler-Weber- Rendu disease, GERD, and recently diagnosed brain abscess returning to the clinic for follow-up of bilateral feet paresthesias and new complaints of left blurred vision.  The patient was accompanied to the clinic by self.  History of present illness: He was hospitalized from May 24 - May 21 2015 with right sided leg weakness and headaches. Patient was subsequently found to have a brain mass 3 x 2 cm involving the basal ganglia with midline shift and edema seen on CT head. 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 Appropriate IV antibiotics including vancomycin, rocephin, and flagyl was started. He was also started on keppra 500mg  BID. Of note, he had routine dental work 2 weeks prior to symptom onset.  He has completed his course of antibiotics therapy with IV for 6 weeks and transitioned to oral levofloxacin and metronidazole for 4 weeks. He has been off antibiotics since early August. He has been doing well and complains of mild right leg weakness. He is scheduled for repeat CT brain in mid-August.   Starting ~ late July 2016, he started noticing numbness/tingling of the toes and shooting pain involving the sole of the foot. Symptoms are constant and involve both feet equally. Shooting pain is worse with weight bearing. Nothing alleviates the pain. He endorses mild low back pain.  UPDATE 09/11/2015:  He no longer has tingling sensation of leg, but continues to have persistent numbness of the feet. Gabapentin was stopped because of GI side effects.  Physical therapy has significantly improved his leg strength, which he feels is back to baseline.  He has developed hoarseness and has seen ENT, who noted thin cords.  His  hand writing is slowly improving, but still not completely improved. Interval CT head from August shows resolving abscess of the left external capsule with reduced edema.   UPDATE 01/12/2016:   He complains of intermittent slurred speech, occuring several times per week.  There is no pattern that he had identified with his speech.  No word-finding difficulty.  Over the holidays, he was not compliant with his home exercises and restarted this and noticed that his right leg seems slightly weaker than before.  No recent falls.  He continues to have persistent numbness of the feet.  He also complains of cold hands.  Denies double vision, dysphagia.    UPDATE 08/25/2016:  He went for annual eye exam and was alarmed that he was unable to read the letters on the Snellen chart, which was surprising because he did not notice and vision changes. He saw his ophthalmologist who is planning on doing a formal visual field test next week.  No headaches, double vision, pain with eye movement.  He states that he has had precancerous lesions removed from his left eyebrow.  His numbness and tingling of the feet has nearly resolved.  He denies peripheral vision changes or vision loss involving the right eye.    Medications:  Current Outpatient Prescriptions on File Prior to Visit  Medication Sig Dispense Refill  . acetaminophen (TYLENOL) 325 MG tablet Take 650 mg by mouth daily as needed (pain).    . Cholecalciferol (VITAMIN D3) 2000 UNITS TABS Take 2,000 Units by mouth daily.    . clidinium-chlordiazePOXIDE (LIBRAX) 5-2.5 MG capsule Take 1 capsule  by mouth 3 (three) times daily as needed. 60 capsule 0  . clorazepate (TRANXENE) 7.5 MG tablet TAKE 1 TABLET BY MOUTH 3 TIMES A DAY AS NEEDED 90 tablet 1  . Diphenhyd-Hydrocort-Nystatin (FIRST-DUKES MOUTHWASH) SUSP 1 tsp gargle and swallow four times daily 120 mL 5  . levothyroxine (SYNTHROID, LEVOTHROID) 125 MCG tablet TAKE 1 TABLET (125 MCG TOTAL) BY MOUTH DAILY BEFORE  BREAKFAST. 90 tablet 3  . loratadine (CLARITIN) 10 MG tablet Take 10 mg by mouth daily.    . Magnesium 250 MG TABS Take 250 mg by mouth at bedtime.     . meclizine (ANTIVERT) 25 MG tablet Take 1 tablet (25 mg total) by mouth daily as needed for dizziness. 30 tablet 2  . metoprolol succinate (TOPROL XL) 25 MG 24 hr tablet Take 1 tablet (25 mg total) by mouth daily. 90 tablet 3  . mirabegron ER (MYRBETRIQ) 25 MG TB24 tablet Take 25 mg by mouth daily.    . Multiple Vitamin (MULTIVITAMIN WITH MINERALS) TABS tablet Take 1 tablet by mouth daily. Centrum    . pantoprazole (PROTONIX) 40 MG tablet Take 1 tablet (40 mg total) by mouth daily. 90 tablet 3  . silodosin (RAPAFLO) 8 MG CAPS capsule Take 1 capsule (8 mg total) by mouth at bedtime. 90 capsule 3   No current facility-administered medications on file prior to visit.     Allergies:  Allergies  Allergen Reactions  . Nsaids Other (See Comments)    Cannot take-per MD  . Other Other (See Comments)    All blood thinners-cannot take per MD  . Aspirin Other (See Comments)    nosebleeds  . Statins Other (See Comments)    Weakness, myalgias    Review of Systems:  CONSTITUTIONAL: No fevers, chills, night sweats, or weight loss.  EYES: No visual changes or eye pain ENT: No hearing changes.  No history of nose bleeds.   RESPIRATORY: No cough, wheezing and shortness of breath.   CARDIOVASCULAR: Negative for chest pain, and palpitations.   GI: Negative for abdominal discomfort, blood in stools or black stools.  No recent change in bowel habits.   GU:  No history of incontinence.   MUSCLOSKELETAL: No history of joint pain or swelling.  No myalgias.   SKIN: Negative for lesions, rash, and itching.   ENDOCRINE: Negative for cold or heat intolerance, polydipsia or goiter.   PSYCH:  No depression or anxiety symptoms.   NEURO: As Above.   Vital Signs:  BP 108/68   Pulse 68   Ht 5\' 10"  (1.778 m)   Wt 186 lb 9 oz (84.6 kg)   SpO2 95%   BMI  26.77 kg/m   Neurological Exam: MENTAL STATUS including orientation to time, place, person, recent and remote memory, attention span and concentration, language, and fund of knowledge is normal.  Speech is not dysarthric, gutteral and lingual sounds intact.  CRANIAL NERVES:  Limited fundoscopic exam due to small pupils.  Pupils equal round and reactive to light.  Normal conjugate, extra-ocular eye movements in all directions of gaze.  Visual fields are grossly intact. Right ptosis (old).  Face is symmetric and muscles are intact. Palate elevates symmetrically.  Tongue is midline.  MOTOR:  Motor strength is 5/5 in all extremities.   No pronator drift.  Mild increase in the tone of the legs.    MSRs:  Reflexes are 3+/4 throughout.  SENSORY:  Vibration is reduced at the ankles bilaterally, intact at the knees.  COORDINATION/GAIT:  Normal finger-to- nose-finger. Intact rapid alternating movements bilaterally.  Gait narrow based and stable.   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  Stable 4 mm LEFT-to-RIGHT midline shift.  CT head 06/24/2015:  Significant interval improvement when compared to the 05/13/2015 examination.  The ring-enhancing lesion which was centered in the left basal ganglia has decreased significantly in size. Currently, 2.3 x 0.9 cm region of relatively solid enhancement superior aspect of the left basal ganglia remains as versus prior 3.1 x 1.9 cm ring-enhancing lesion. Significant decrease in surrounding vasogenic edema and marked decrease mass effect upon the left lateral ventricle. What remains may represent residua of treated abscess. This will need to be followup until complete clearance. As this clears it would be  helpful to exclude the possibility of underlying vascular abnormality in this patient with Osler-Weber-Rendu.  CT head 08/11/2015: Resolving abscess left external capsule. There is decreased edema and decreased enhancement in this area. No residual fluid collection. No other areas of acute abnormality.  CT head wwo contrast 01/06/2016:  Hypoattenuation in the LEFT basal ganglia and regional white matter is improved from August but not yet normalized. No abnormal postcontrast enhancement to definitively suggest residual infection. Continued surveillance may be warranted, given the patient's residual RIGHT leg weakness and intermittent slurred speech.   NCS/EMG of the legs 09/16/2015: 1. The electrophysiologic findings are most consistent with a generalized sensorimotor polyneuropathy, axon loss in type, affecting the lower extremities. Overall, these findings are severe in degree electrically with respect to sensory responses. 2. 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  Labs 09/11/2015:  vitamin B12 874, folate 24.8, vitamin B1 36, copper 77, vitamin B6 73.8, myasthenia panel negative   IMPRESSION/PLAN: 1. Monocular vision changes, described as blurry vision involving the left eye only  - Discussed possibilities including problem intrinsic to the eye or optic nerve pathology which would cause vision loss only in one eye  - Certainly the possibility of optic neuropathy is possible - he was exposed to flagyl last year which caused neuropathy of the legs  - Based on the results of his visual field testing and opthalmology evaluation, I will decide whether he needs intracranial imaging. MRI cannot be performed due to metal in his face.    2.  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  3.  Right basal ganglia polymicrobial abscess, completed course of  antibiotic therapy.  CT brain from January 2017 showed residual left basal ganglia changes, improved from previously, no evidence of infection.  The location of his abscess involving the basal ganglia is most likely accounting for his mild right leg weakness (subjective) and dysarthria .   4.  Hoarseness - resolved, MG panel negative.   Return to clinic after opthalmology work-up    The duration of this appointment visit was 30 minutes of face-to-face time with the patient.  Greater than 50% of this time was spent in counseling, explanation of diagnosis, planning of further management, and coordination of care.   Thank you for allowing me to participate in patient's care.  If I can answer any additional questions, I would be pleased to do so.    Sincerely,    Donika K.  Posey Pronto, DO

## 2016-09-09 ENCOUNTER — Ambulatory Visit (INDEPENDENT_AMBULATORY_CARE_PROVIDER_SITE_OTHER): Payer: Medicare Other

## 2016-09-09 DIAGNOSIS — Z23 Encounter for immunization: Secondary | ICD-10-CM | POA: Diagnosis not present

## 2016-10-18 ENCOUNTER — Encounter: Payer: Self-pay | Admitting: Pulmonary Disease

## 2016-10-21 ENCOUNTER — Encounter: Payer: Self-pay | Admitting: Neurosurgery

## 2016-10-25 ENCOUNTER — Ambulatory Visit (INDEPENDENT_AMBULATORY_CARE_PROVIDER_SITE_OTHER)
Admission: RE | Admit: 2016-10-25 | Discharge: 2016-10-25 | Disposition: A | Discharge status: Home / Self Care | Payer: Medicare Other | Source: Ambulatory Visit | Attending: Adult Health | Admitting: Adult Health

## 2016-10-25 ENCOUNTER — Encounter: Payer: Self-pay | Admitting: Adult Health

## 2016-10-25 ENCOUNTER — Ambulatory Visit (INDEPENDENT_AMBULATORY_CARE_PROVIDER_SITE_OTHER): Payer: Medicare Other | Admitting: Adult Health

## 2016-10-25 VITALS — BP 138/68 | HR 62 | Temp 97.6°F | Ht 70.0 in | Wt 185.0 lb

## 2016-10-25 DIAGNOSIS — I2510 Atherosclerotic heart disease of native coronary artery without angina pectoris: Secondary | ICD-10-CM | POA: Diagnosis not present

## 2016-10-25 DIAGNOSIS — J4530 Mild persistent asthma, uncomplicated: Secondary | ICD-10-CM | POA: Diagnosis not present

## 2016-10-25 DIAGNOSIS — R05 Cough: Secondary | ICD-10-CM

## 2016-10-25 DIAGNOSIS — R053 Chronic cough: Secondary | ICD-10-CM

## 2016-10-25 MED ORDER — HYDROCODONE-HOMATROPINE 5-1.5 MG/5ML PO SYRP: 5.0000 mL | ORAL_SOLUTION | Freq: Four times a day (QID) | ORAL | 0 refills | Status: DC | PRN

## 2016-10-25 MED ORDER — BENZONATATE 200 MG PO CAPS: 200.0000 mg | ORAL_CAPSULE | Freq: Three times a day (TID) | ORAL | 1 refills | Status: DC | PRN

## 2016-10-25 MED ORDER — PREDNISONE 10 MG PO TABS: ORAL_TABLET | ORAL | 0 refills | Status: DC

## 2016-10-25 NOTE — Assessment & Plan Note (Signed)
Slow to resolve flare with cough  Check cxr  Cont GERD tx .   Plan  Patient Instructions  Change Claritin to Zyrtec 10mg  At bedtime   Begin Delsym 2 tsp Twice daily  , As needed  Cough .  Begin Hydromet 1 tsp every 6hr As needed  Cough. -may make you sleepy.  Tessalon Three times a day  As needed  Cough.  Check chest xray .  Prednisone taper over week.  Please contact office for sooner follow up if symptoms do not improve or worsen or seek emergency care  follow up Dr. Lenna Gilford  In 1 month and As needed

## 2016-10-25 NOTE — Progress Notes (Signed)
Subjective:    Patient ID: Gregory [name], male    DOB: 02-Jun-1941, 75 y.o.   MRN: [id number]  HPI 75 yo male followed for Osler-Weber-Rendue syndrome/ hereditary telangiectasias, COPD , A Flutter.   TEST /Event  Brain Abscess (polymicrobial) 04/2015 , tx w/ prolonged abx.  10/25/2016 Acute OV  Pt presents for an acute office visit . He complains of persistent cough for last 6 months . Cough was felt to be GERD related. He has increased his PPI but no change in cough . Cough is dry in nature and worse at night. Taking mucinex dm without change. No fever, discolored mucus, chest pain, or orthopnea.      Past Medical History:  Diagnosis Date  . Anxiety state, unspecified   . Arthritis   . Benign neoplasm of colon   . GERD (gastroesophageal reflux disease)   . 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 History of nonmelanoma skin cancer EXCISION SQUAMOUS CELL FROM HAND  . Hypertrophy of prostate with urinary obstruction and other lower urinary tract symptoms (LUTS)   . Irritable bowel syndrome   . Lumbago   . Other diseases of nasal cavity and sinuses(478.19)   . Persistent disorder of initiating or maintaining sleep   . Plantar fascial fibromatosis   . Pure hypercholesterolemia   . 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 Unspecified hypothyroidism    Current Outpatient Prescriptions on File Prior to Visit  Medication Sig Dispense Refill  . acetaminophen (TYLENOL) 325 MG tablet Take 650 mg by mouth daily as needed (pain).    . Cholecalciferol (VITAMIN D3) 2000 UNITS TABS Take 2,000 Units by mouth daily.    . clidinium-chlordiazePOXIDE (LIBRAX) 5-2.5 MG capsule Take 1 capsule by mouth 3 (three) times daily as needed. 60 capsule 0  . clorazepate (TRANXENE) 7.5 MG tablet TAKE 1 TABLET BY MOUTH 3 TIMES A DAY AS NEEDED 90  tablet 1  . Diphenhyd-Hydrocort-Nystatin (FIRST-DUKES MOUTHWASH) SUSP 1 tsp gargle and swallow four times daily 120 mL 5  . levothyroxine (SYNTHROID, LEVOTHROID) 125 MCG tablet TAKE 1 TABLET (125 MCG TOTAL) BY MOUTH DAILY BEFORE BREAKFAST. 90 tablet 3  . loratadine (CLARITIN) 10 MG tablet Take 10 mg by mouth daily.    . Magnesium 250 MG TABS Take 250 mg by mouth at bedtime.     . meclizine (ANTIVERT) 25 MG tablet Take 1 tablet (25 mg total) by mouth daily as needed for dizziness. 30 tablet 2  . metoprolol succinate (TOPROL XL) 25 MG 24 hr tablet Take 1 tablet (25 mg total) by mouth daily. 90 tablet 3  . Multiple Vitamin (MULTIVITAMIN WITH MINERALS) TABS tablet Take 1 tablet by mouth daily. Centrum    . silodosin (RAPAFLO) 8 MG CAPS capsule Take 1 capsule (8 mg total) by mouth at bedtime. 90 capsule 3  . mirabegron ER (MYRBETRIQ) 25 MG TB24 tablet Take 25 mg by mouth daily.    . pantoprazole (PROTONIX) 40 MG tablet Take 1 tablet (40 mg total) by mouth daily. (Patient not taking: Reported on 10/25/2016) 90 tablet 3   No current facility-administered medications on file prior to visit.      Review of Systems Constitutional:   No  weight loss, night sweats,  Fevers, chills, + fatigue, or  lassitude.  HEENT:   No headaches,  Difficulty swallowing,  Tooth/dental  problems, or  Sore throat,                No sneezing, itching, ear ache, nasal congestion, post nasal drip,   CV:  No chest pain,  Orthopnea, PND, swelling in lower extremities, anasarca, dizziness, palpitations, syncope.   GI  No heartburn, indigestion, abdominal pain, nausea, vomiting, diarrhea, change in bowel habits, loss of appetite, bloody stools.   Resp:    No chest wall deformity  Skin: no rash or lesions.  GU: no dysuria, change in color of urine, no urgency or frequency.  No flank pain, no hematuria   MS:  No joint pain or swelling.  No decreased range of motion.  No back pain.  Psych:  No change in mood or affect. No  depression or anxiety.  No memory loss.         Objective:   Physical Exam Vitals:   10/25/16 0945  BP: 138/68  Pulse: 62  Temp: 97.6 F (36.4 C)  TempSrc: Oral  SpO2: 95%  Weight: 185 lb (83.9 kg)  Height: 5\' 10"  (1.778 m)   GEN: A/Ox3; pleasant , NAD, elderly    HEENT:  [place]/AT,  EACs-clear, TMs-wnl, NOSE-left nare chronic occlusion /skin graft.  THROAT-clear, no lesions, no postnasal drip or exudate noted.   NECK:  Supple w/ fair ROM; no JVD; normal carotid impulses w/o bruits; no thyromegaly or nodules palpated; no lymphadenopathy.    RESP  Clear  P & A; w/o, wheezes/ rales/ or rhonchi. no accessory muscle use, no dullness to percussion  CARD:  RRR, no m/r/g  , no peripheral edema, pulses intact, no cyanosis or clubbing.  GI:   Soft & nt; nml bowel sounds; no organomegaly or masses detected.   Musco: Warm bil, no deformities or joint swelling noted.   Neuro: alert, no focal deficits noted.    Skin: Warm, no lesions or rashes  Gustavo Meditz NP-C   Pulmonary and Critical Care  10/25/2016        Assessment & Plan:

## 2016-10-25 NOTE — Patient Instructions (Signed)
Change Claritin to Zyrtec 10mg  At bedtime   Begin Delsym 2 tsp Twice daily  , As needed  Cough .  Begin Hydromet 1 tsp every 6hr As needed  Cough. -may make you sleepy.  Tessalon Three times a day  As needed  Cough.  Check chest xray .  Prednisone taper over week.  Please contact office for sooner follow up if symptoms do not improve or worsen or seek emergency care  follow up Dr. Lenna [name]  In 1 month and As needed

## 2016-11-01 ENCOUNTER — Telehealth: Payer: Self-pay | Admitting: Adult Health

## 2016-11-01 MED ORDER — AMOXICILLIN-POT CLAVULANATE 875-125 MG PO TABS: 1.0000 | ORAL_TABLET | Freq: Two times a day (BID) | ORAL | 0 refills | Status: DC

## 2016-11-01 NOTE — Telephone Encounter (Signed)
Spoke with pt. He is aware of TP's recommendations. Rx has been sent in. Nothing further was needed.

## 2016-11-01 NOTE — Telephone Encounter (Signed)
Sorry to hear that , may begin Augmentin 875mg  Twice daily  For 7 d , #14, no refills.  Cont w/ ov recs If not improving will need ov to reevaluate .  Make sure no fever or hemoptysis.  Please contact office for sooner follow up if symptoms do not improve or worsen or seek emergency care  Keep ov as planned and sooner if needed.

## 2016-11-01 NOTE — Telephone Encounter (Signed)
Spoke with pt. He is not feeling any better since seeing TP on 10/25/2016. Still reports having issues with coughing. Cough is now producing yellow mucus. Denies chest tightness, wheezing, SOB or fever. Thinks he needs an antibiotic sent in.  TP - please advise. Thanks.

## 2016-11-29 ENCOUNTER — Other Ambulatory Visit: Payer: Self-pay | Admitting: Pulmonary Disease

## 2016-11-29 DIAGNOSIS — I483 Typical atrial flutter: Secondary | ICD-10-CM

## 2016-12-07 ENCOUNTER — Ambulatory Visit (INDEPENDENT_AMBULATORY_CARE_PROVIDER_SITE_OTHER): Payer: Medicare Other | Admitting: Pulmonary Disease

## 2016-12-07 ENCOUNTER — Encounter: Payer: Self-pay | Admitting: Pulmonary Disease

## 2016-12-07 VITALS — BP 114/58 | HR 64 | Temp 97.0°F | Ht 70.0 in | Wt 183.1 lb

## 2016-12-07 DIAGNOSIS — I483 Typical atrial flutter: Secondary | ICD-10-CM

## 2016-12-07 DIAGNOSIS — I780 Hereditary hemorrhagic telangiectasia: Secondary | ICD-10-CM

## 2016-12-07 DIAGNOSIS — F411 Generalized anxiety disorder: Secondary | ICD-10-CM | POA: Diagnosis not present

## 2016-12-07 DIAGNOSIS — N401 Enlarged prostate with lower urinary tract symptoms: Secondary | ICD-10-CM | POA: Diagnosis not present

## 2016-12-07 DIAGNOSIS — N138 Other obstructive and reflux uropathy: Secondary | ICD-10-CM

## 2016-12-07 DIAGNOSIS — E7800 Pure hypercholesterolemia, unspecified: Secondary | ICD-10-CM

## 2016-12-07 DIAGNOSIS — E039 Hypothyroidism, unspecified: Secondary | ICD-10-CM | POA: Diagnosis not present

## 2016-12-07 DIAGNOSIS — J3489 Other specified disorders of nose and nasal sinuses: Secondary | ICD-10-CM

## 2016-12-07 DIAGNOSIS — D126 Benign neoplasm of colon, unspecified: Secondary | ICD-10-CM

## 2016-12-07 DIAGNOSIS — G6289 Other specified polyneuropathies: Secondary | ICD-10-CM

## 2016-12-07 DIAGNOSIS — M150 Primary generalized (osteo)arthritis: Secondary | ICD-10-CM

## 2016-12-07 DIAGNOSIS — M159 Polyosteoarthritis, unspecified: Secondary | ICD-10-CM

## 2016-12-07 DIAGNOSIS — K219 Gastro-esophageal reflux disease without esophagitis: Secondary | ICD-10-CM | POA: Diagnosis not present

## 2016-12-07 DIAGNOSIS — I2510 Atherosclerotic heart disease of native coronary artery without angina pectoris: Secondary | ICD-10-CM

## 2016-12-07 MED ORDER — CILIDINIUM-CHLORDIAZEPOXIDE 2.5-5 MG PO CAPS: 1.0000 | ORAL_CAPSULE | Freq: Three times a day (TID) | ORAL | 5 refills | Status: DC | PRN

## 2016-12-07 MED ORDER — DEXLANSOPRAZOLE 60 MG PO CPDR: 60.0000 mg | DELAYED_RELEASE_CAPSULE | Freq: Every day | ORAL | 3 refills | Status: DC

## 2016-12-07 MED ORDER — SILODOSIN 8 MG PO CAPS: 8.0000 mg | ORAL_CAPSULE | Freq: Every day | ORAL | 3 refills | Status: DC

## 2016-12-07 MED ORDER — LEVOTHYROXINE SODIUM 125 MCG PO TABS: ORAL_TABLET | ORAL | 3 refills | Status: DC

## 2016-12-07 MED ORDER — CLORAZEPATE DIPOTASSIUM 7.5 MG PO TABS: ORAL_TABLET | ORAL | 5 refills | Status: DC

## 2016-12-07 MED ORDER — METOPROLOL SUCCINATE ER 25 MG PO TB24: 25.0000 mg | ORAL_TABLET | Freq: Every day | ORAL | 3 refills | Status: DC

## 2016-12-07 NOTE — Patient Instructions (Signed)
Today we updated your med list in our EPIC system...    Continue your current medications the same...  We refilled your meds per re [facility]...  We gave you the last of the indicated PNEUMONIA shots today> Prevnar-13...  Great idea to do yoga, exercise program, etc...     This will help your wind, stamina, balance, etc...  Call for any questions...  Let's plan a follow up visit in 100mo, sooner if needed for acute problems.Marland KitchenMarland Kitchen

## 2016-12-07 NOTE — Progress Notes (Signed)
Subjective:    Patient ID: Gregory [name], male    DOB: 08-24-1941, 75 y.o.   MRN: [id number]  HPI 75 y/o WM here for a follow up visit... he has multiple medical problems including:  Hx OWR syndrome;  Hx atypCP;  Hyperchol;  Hypothyroidism;  GERD/ Divertics/ IBS/ Colon polyps;  BPH/ BOO;  DJD/ LBP/ Plantar fasciitis;  Hx dizziness;  Anxiety & chronic persistant insomnia... 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  LABS 10/14:  FLP- on diet alone looks pretty good w/ LDL=111...  EKG 10/14 showed SBrady, rate57, otherw wnl;  2DEcho 10/14 showed normal LV size & wall thickness, norm LVF w/ EF=55-60%, Gr1DD, mildly thickened AoV leaflets, mild MR;  ETT 11/14 showed 75mn on treadmill, hypertensive BP response, rare PVC, no ischemia, O2 sats dropped to 87% w/ exercise...  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 Given this mild airflow obstruction we will start Rx w/ Dulera100- 2spBid   CXR 3/15 showed normal heart size, clear lungs, mild apical scarring, right basilar density c/w AVM based on CT report 2004, NAD..Marland KitchenMarland Kitchen EKG 3/15 showed NSR, rate87, low volt, NAD...  ADDENDUM> LABS 4/15 showed>  FLP- TChol 137, TG 30, HDL 36, LDL 95;  Chems- wnl;  CBC- anemic w/ Hg=11.3 & MCV=77;  TSH=9.26...   Ret for Iron panel (Fe=16- 4%sat), Ferritin (6.4), Stool cards (pending); then start FeSO4-3244mID w/ VitC500... Recheck CBC, Iron level in 6-8weeks...   Taking Synthroid112 regularly, therefore increase back to 12568md... Recheck TSH in 62mo629moe never returned.   ~  October 08, 2014:  74mo 74mo& [name] iMikki Santeetill working  but has cut back to 2d/wk; feeling better overall and most of his dyspnea symptoms resolved w/ FeSO4 7 return of his Hg to normal; energy is good & he exercises on treadmill, doing better on stairs, etc; [facility] improved back to baseline...  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    Hx CP>  Prev cardiac w/u by DrCreHilary Hertzneg, no recurrent CP complaints, he remains active but notes DOE w/ stairs, ok on level ground, 2DEcho & GXT were OK x O2 sat drop to 87% at peak exercise- ?incr shunting thru his RLL AVM...    Chol> Intol to Simva40 & Cres5 due to musc cramps; FLP 4/15 on diet alone showed TChol 137, TG 30, HDL 36, LDL 95...    Hypothy>  On Synthroid 125mcg562mow>  Labs 10/15 revealed TSH= 5.10 & clinically euthyroid, continue same...Marland KitchenMarland Kitchen  GI- GERD, IBS, Polyps> on Prilosec40Qd & Zofran/ Librax prn; followed by [facility] [place] & seen 6/15> note reviewed (heme pos stool & anemia from epistaxis & NSAIDs); he had colonoscopy 3/12- neg x for hems & DrM rec f/u 74yr...    GU>  Hx BPH w/ BOO on Rapaflo 848md per DrEskridge; last seen 1/15 & doing satis w/ PSA= 2.34    Ortho> on Mobic7.5 prn + OTC meds; eval 12/11 by DrNorris for bilat leg pain & cramping, ?min atrophy of left gastroc- he rec tonic water & neurology eval> he saw DrWillis 3/12 (note reviewed)> NCV was normal; EMG c/w mild S1 radiculopathy; he rec MRI- pt decided against the MRI (we did not get f/u note)...    Anxiety/ Insomnia>  He has Chlorazepate7.5 for  Prn use, and Ambien10 to use for sleep Qhs... We reviewed prob list, meds, xrays and labs> see below for updates >> OK 2015 Flu shot today...   LABS 10/15:  CBC- wnl w/ Hg= 16.5, Fe=88 (23%sat), Ferritin=36;  TSH=5.10... REC>  Continue Iron supplement daily, and the Synthroid125/d...   ~  April 01, 2015:  47m27moV & [name] reports a good interval>  He has notes some intermittent & persistent nose bleeds due to his OWR & nasal obstruction, he uses nasal saline & will f/u w/ ENT- DrRosen... He also notes some discomfort in knees, hips, back but he can't take strong NSAIDs due to OWR & risk of GI bleeds; we discussed trial of Tramadol50 w/ tylenol to see how this works on a prn basis... His 3rd complaint is his chronic persistent insomnia- only sleeping 4h per night & difficulty getting back to sleep; we reviewed a few strategies to deal w/ this- he notes that Tranxene taken when he arises will help him go back to sleep 7 no hangover- this is a win-win strategy & he will continue... 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    Hx CP>  Prev cardiac w/u by DrCHilary Hertzs neg, no recurrent CP- he remains active but notes DOE w/ stairs, ok on level ground, 2DEcho & GXT were OK x O2 sat drop to 87% at peak exercise- ?incr shunting thru his RLL AVM...    Chol> Intol to Simva40 & Cres5 due to musc cramps; FLP 4/16 on diet alone showed TChol 146, TG 85, HDL 35, LDL 94...    Hypothy>  On Synthroid 125m59m now>  Labs 4/16 revealed TSH= 4.38 &  clinically euthyroid, continue same...    GI- GERD, IBS, Polyps> on Prilosec40Qd & Zofran/ Librax prn; [facility] note reviewed (heme pos stool & anemia from epistaxis & NSAIDs); he had colonoscopy 3/12- neg x for hems & DrM rec f/u 62yrs29yr   GU>  Hx BPH w/ BOO on Rapaflo 8mg/d71mr DrEskridge; last seen 1/16 & doing satis & pt reports that his PSA was OK...    Ortho> on OTC meds prn; eval 12/11 by DrNorris for bilat leg pain & cramping, ?  min atrophy of left gastroc- he rec tonic water & neurology eval> he saw DrWillis 3/12 (note reviewed)> NCV was normal; EMG c/w mild S1 radiculopathy; he rec MRI- pt decided against the MRI;  He reports some discomfort in knees, hips, back & we decided on trial of Tramadol50...    Anxiety/ Insomnia>  He has Chlorazepate7.5 for Prn use, and Ambien10 to use for sleep Qhs... We reviewed prob list, meds, xrays and labs> see below for updates >>   CXR 4/16 showed norm heart size, clear lungs w/ some hyperinflation, RLL AVM noted w/o change...  EKG 4/16 revealed poor tracing- NSR, rate68, minor NSSTTWA, NAD...   LABS 4/16:  FLP- at goals on diet alone;  Chems- wnl;  CBC- wnl;  TSH=4.38...  ~  June 04, 2015:  30moROV & post hosp check>  BMikki Santeewas hosp 5/24 - 05/21/15 by [facility] after presenting w/ HA & right leg weakness; he tells me he saw his dentist for routine cleaning 2wks prior to this; eval revealed a brain lesion in left basal ganglionic region (tumor vs abscess); he had brain stereotactic bx 5/27 by DrNundkumar (left frontal craniectomy) & thick pus was removed- cultures +peptostreptococcus species (otherw neg aerobic bact cult, AFB, Fungi); he was seen by ID & placed on Rochepin, Vanco, Flagyl to be continued 194moost disch (they are checking levels and adjusting dose); also started on Keppra500Bid per NS;  Since disch he is feeling better- no f/c/s, notes sl dry cough/ no sput, & the cough is keeping him awake- TUSSIONEX called in & helps he says;   Sl nausea relieved by phenergan prn...  EXAM reveals Afeb, VSS, O2sat=94% on RA at rest;  HEENT- OWR telangiectasias w/o change;  Chest- few basilar crackles w/o wheezing, rhonchi, consolidation;  Heart- RR w/o m/r/g;  Abd- neg;  Ext- neg w/o c/c/e...  CT Head 5/16 showed 2 x 3 cm LEFT basal ganglia mass, likely tumor vs abscess, moderately extensive surrounding edema, small surgical clips within or near the posterior wall LEFT maxillary sinus/LEFT pterygopalatine fossa, in this patient with history of nose bleed (these are stable from prior scans)...  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   EKG 5/16 showed NSR, rate68, wnl, NAD...Marland KitchenMarland KitchenVen Dopplers 6/16 were neg for DVT  LABS- reviewed> only pos cult= peptostreptococcus from brain bx;  Chems- ok x Na & Ca sl low;  CBC- ok x WBC 14=>11... 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 Given IS, refill Phenergan & Tussionex...  ~  July 31, 2015:  17mo34mo & add-on appt requested for several complaints>  [name] finished his antibiotic Rx for brain abscess (likely due to dental work several weeks prior to onset of neuro symptoms in [id number] DrCampbell has arranged for a follow up CT Brain 08/11/15 (this will be 3 wks off antibiotics);  He had an  episode of tachycardia (?AFlutter  w/ rvr per EMS) w/ HR incr to 170's- went to ER 7/13 (but was in NSR) & he is currently wearing a Holter monitor per DrCrenshaw w/ follow-up Cards appt sched for next week...        He presents today w/ several complaints>  1) hoarseness: note this is intermittent, off & on, swallowing OK, denies indigestion/ reflux/ etc; he is on Protonix40 w/ his hx of OWR, plus zofran/ librax; we reviewed antireflux regimen & agreed to refer him to ENT for a look at his larynx & their opinion.Marland KitchenMarland Kitchen  2) c/o numbness in toes and bottom of his feet bilat for the last week or so w/ some discomfort while walking; no culprit meds on his list & we offered referral to Neuro for a neuropathy evaluation.Marland KitchenMarland Kitchen  3) he is c/o weight loss> weight today is 167#, 5'10" Tall, BMI=24 which is wnl; last wt=182# 80moago for a 15# wt loss, appetite is good & intake unchanged he says; we discussed caloric intake and nutritional supplements; we also checked labs> Chems- wnl, BS=103, A1c=5.7;  CBC- wnl; Note- he is on Synthroid1259m/d and TSH= 3.28 when checked 07/03/15...   CXR 07/02/15 showed norm heart size, clear lungs, NAD...   CT Head 08/11/15 showed left frontal craniotomy, resolved abscess in left external capsule w/ decr edema and decr enhancement...  2DEcho 08/14/15 showed norm LV size & function w/ EF=55-60%, no regional wall motion abn, Gr2DD, trace MR & TR  ~  October 02, 2015:  39m61moV & BobMikki Santees gained 4# up to #171# today on Ensure 1can/d:  We discussed the need for AMOXICILLIN 2gm 1H prior to dentist...    He saw ENT (we do not have note) & pt indicates that his cords were sl thinned, they offered injection rx but his hoarseness is improved & holding off...     He saw DrCampbell, ID on 8/1> f/u anaerobic brain abscess after dental work & presented w/ right sided weakness (resolved); he finished 39mo36moLevaquin & Flagyl, no f/c/s, neuro exam was WNL, & f/u scan 8/22 w/ resolved abscess; pt tells me DrCampbell signed off...     He saw  DrCrenshaw 8/18 for Cards> f/u PAFlutter episode after disch from hosp & converted spont, they did 30d Holter- all NSR/ STachy, on ToprolXL25; they rechecked his 2DEcho- normal valves, Gr2DD...  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 Overall, these findings are severe in degree electrically with respect to sensory responses. 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  We reviewed prob list, meds, xrays and labs> NOTE: THEY WANT ANOTHER CT HEAD IN [id number] ~  January 01, 2016:  68mo 39mo& [name] iMikki Santeetable- persistent neuropathy symptoms (?related to Flagyl Rx?), c/o cold hands, speech difficulty (tongue feels bigger), ?memory problem- offered speech path assessment but he notes Neuro f/u appt in several weeks & wants to wait;  Hoarseness improved w/ MMW;  DrCampbell, ID has signed off- pt & wife want another CT Head to f/u the prev brain abscess &  we will order;  He knows to take AMOX prior to any procedures (due to his OWR)... No other interval visits in the last 9mo he requests mult med refills- ok...  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    Hx CP, episode of PAFlutter> w/u by DHilary Hertzwas neg, no recurrent CP or arrhythmia- he remains active but notes DOE w/ stairs, ok on level ground, 2DEcho & GXT were OK x O2 sat drop to 87% at peak exercise- ?incr shunting thru his RLL AVM...    Chol> Intol to Simva40 & Cres5 due to musc cramps; FLP 4/16 on diet alone showed TChol 146, TG 85, HDL 35, LDL 94...    Hypothy>  On Synthroid 1227m/d now>  Labs 7/16 revealed TSH= 3.28 & clinically euthyroid, continue same...    GI- GERD, IBS, Polyps> on Protonix40Qd & Zofran/ Librax prn; [facility] note reviewed (heme pos stool & anemia from epistaxis & NSAIDs); he had colonoscopy 3/12- neg x for hems & DrM rec f/u 5y27yr.    GU>  Hx BPH w/ BOO on Rapaflo 8mg76mper DrEskridge; last seen 1/16 & doing satis & pt reports that his PSA was OK...    Ortho> on OTC meds prn; eval 12/11 by DrNorris for bilat leg pain & cramping, ?min atrophy of left gastroc- he rec tonic water & neurology eval> he saw DrWillis 3/12 (note reviewed)> NCV was normal; EMG c/w mild S1 radiculopathy; he rec MRI- pt decided against the MRI;  He reports some discomfort in knees, hips, back & we decided on trial of Tramadol50...    Anxiety/ Insomnia>  He has Chlorazepate7.5 for Prn use, and Ambien10 to use for sleep Qhs... EXAM reveals Afeb, VSS, O2sat=91% on RA at rest;  Wt is up 8# to 179#; HEENT- OWR telangiectasias w/o change;  Chest- few basilar crackles w/o wheezing, rhonchi, consolidation;  Heart- RR w/o m/r/g;  Abd- neg;  Ext- neg w/o c/c/e...  LABS 1/17>  BMet- wnl  CT Brain 1/17> pending IMP/PLAN>>  [name] mult somatic complaints lingering & he is still quite anxious; he wants another CT scan & we will order this; he is encouraged to continue his regular f/u appts w/ his specialists & we will recheck in 4mon13monthADDENDUM>>  CT Brain1/17/17 w/ contrast  showed no acute changes, area of hypoattenuation in left ext capsule & lentiform nucleus extending to the white matter of the centrum semiovale is improved from 07/2015, no abn post contrast enhacement- residual infection is unlikely... He will decide if he wants to repeat the scan in ~63mo..24mo  June 07, 2016:  42mo RO58mopulmonary/medical follow up>  [name]  IsMikki Santeeo persistent neuropathy symptoms and right upper lip is numb, otherw feeling well/ energy good/ no f/c/s, no cough/ no sput "except at night" he says;  Notes that he'll wake 1-2 x per week & wakes him up => we discussed reflux & a vigorous antireflux regimen (PPI before dinner,  NPO after dinner, elev HOB)... 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    Hx CP, episode of PAFlutter> w/u by Hilary Hertz was neg, no recurrent CP or arrhythmia- he remains active but notes DOE w/ stairs, ok on level ground, 2DEcho & GXT were OK x O2 sat dropped to 87% at peak exercise- ?incr shunting thru his RLL AVM.Marland KitchenMarland Kitchen Seen by Cards 2/17> OWR, AFlutter, HL; adm w/ brain abscess 5/16 (predisposed by his OWR?); on ToprolXL25, tol well, no recurrent arrhythmia...     Chol> Intol to Simva40 & Cres5 due to musc cramps; FLP 6/17 on diet alone showed TChol 155, TG 60, HDL 42, LDL 101...    Hypothy>  On Synthroid 115mg/d now>  Labs 6/17 revealed TSH= 2.41 & clinically euthyroid, continue same...    GI- GERD, IBS, Polyps> on Protonix40Qd & Zofran/ Librax prn; [facility] note reviewed (heme  pos stool & anemia from epistaxis & NSAIDs); he had colonoscopy 3/17 w/ divertics, hems, no polyps...    GU>  Hx BPH w/ BOO prev on Rapaflo 8719md per DrEskridge; urodynamics showed small capacity hypersens bladder- note 12/2015 reviewed...    Ortho/ Neuro- neuropathy, hx brain abscess> on OTC meds prn; eval 12/11 by DrNorris for bilat leg pain & cramping, ?min atrophy of left gastroc- he rec tonic water & neurology eval> he saw DrWillis 3/12 (note reviewed)> NCV was normal; EMG c/w mild S1 radiculopathy; he rec MRI- pt decided against the MRI;  He reports some discomfort in knees, hips, back & we decided on trial of Tramadol50... He saw DrPatel 12/2015- hx brain abscess 5/16, followed by neuropathy symptoms, and neuro notes reviewed...     Anxiety/ Insomnia>  He has Chlorazepate7.5 for Prn use, and Ambien10 to use for sleep Qhs... EXAM reveals Afeb, VSS, O2sat=91% on RA at rest;  Wt is up 8# to 179#; HEENT- OWR telangiectasias w/o change;  Chest- few basilar crackles w/o wheezing, rhonchi, consolidation;  Heart- RR w/o m/r/g;  Abd- neg;  Ext- neg w/o c/c/e...  LABS 06/09/16>  FLP- all parameters at goals on diet alone;  Chems- wnl;  CBC- wnl;  TSH=2.41;  PSA=1.58 IMP/PLAN>>  We reviewed his reflux symptoms and advised a vigorous antireflux regimen w/ Protonix40 before dinner, NPO after dinner, Elev HOB; otherw same meds 7 [name] in 19m14mooner prn...   ~  December 07, 2016:  19mo319mo & [name] reports doing well- no new complaints or concerns "I feel good"; he does note some right leg numbness but notes that his upper lip is better and his toe neuropathy is better...     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    He has Hx CP & PAFlutter> followed by DrCrHilary Hertztable on MetoprololER25/d...    [facility] & note reviewed- OWR, GERD, divertics, colon adenoma; prev on  Protonix40 but concern for memory therefore switched to [place];  He had EGD 09/2016 w/ gastric & duodenal AVMs noted...    He had Neuro f/u DrPatel 08/25/16> hx brain abscess (occurred after dental work), bilat feet paresthesias (thought to be due to Flagyl), intol to Gabapentin w/ GI side effects, PT helped; noted recent vision issues...     He had Urology appt w/ Lequita Halt, [facility]- 07/26/16> prev followed by DrEskridge, c/o freq/ urgency/ nocturia, improved on Myrbetriq50 EXAM reveals Afeb, VSS, O2sat=94% on RA at rest;  Wt is stable at 183#; HEENT- OWR telangiectasias w/o change;  Chest- few basilar crackles w/o wheezing, rhonchi, consolidation;  Heart- RR w/o m/r/g;  Abd- neg;  Ext- neg w/o c/c/e... IMP/PLAN>>  [name] is stable & doing reasonably well; he says DrMedoff stopped his Protonix because he was on it for yrs and over concern for dementia & switched him to [employer];  We gave him the Prevnar-13 vaccine today & now he is up-to-date...           Problem List:   BRAIN ABSCESS  >>  [name] was hosp 5/24 - 05/21/15 by [facility] after presenting w/ HA & right leg weakness; he tells me he saw his dentist for routine cleaning 2wks prior to this; eval revealed a brain lesion in left basal ganglionic region (tumor vs abscess); he had brain stereotactic bx 5/27 by DrNundkumar (left frontal craniectomy) & thick pus was removed- cultures +peptostreptococcus species (otherw neg aerobic bact cult, AFB, Fungi); he was seen by ID & placed on Rochepin, Vanco, Flagyl to be continued 38mopost disch (they are checking levels and adjusting dose); also started on Keppra500Bid per NS... ~  6/16:  Since disch he is feeling better- no f/c/s, notes sl dry cough/ no sput, & the cough is keeping him awake- TUSSIONEX called in & helps he says;  Sl nausea relieved by phenergan prn...  ~  8/16:  He was checked by DrCampbell, ID- they stopped antibiotics and rec f/u CT Brain 08/11/15= 3 wks off the antibiotic rx => resolved abscess. ~  1/17:   CT Brain - pending  OTHER DISEASES OF NASAL CAVITY AND SINUSES - Hx of Osler-Weber-Rendue syndrome/ hereditary telangiectasias... he's had numerous nose bleeds and surgery by DEtter Sjogren now followed by DBarbaraann Faster.. left nares is occluded w/ skin graft- no lumen... this is quite uncomfortable to the patient but he's been told there is nothing further that can be done for this... ~  3/11:  notes occas nosebleeds but he is able to handle them... ~  4/12:  No diff in his pattern of mild nose bleeds & he denies any severe epistaxis episodes... 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 ~  4/14:  He notes infreq bleeding now w/ saline etc... 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 Dyspnea w/ O2 desat w/ exercise >>  ~  12/14:  PFTs showed FVC=3.98 (89%), FEV1=2.72 (79%), %1sec=68, and mid-flows= 51% predicted; c/w mild obstructive dis & we decided on a trial of Dulera100-2spBid ~  4/15:  His breathing is improved w/ the [facility] and exercise program... ~  10/15:  His breathing is back to baseline after FeSO4 rx for anemia & ret of Hg to normal; he has stopped the [facility] & has it handy for prn use... ~  4/16:  prev on Dulera100-2spBid, now just prn; breathing improved w/ exercise; O2 sat= 93% on RA today; c/o dry throat & encouraged to use lozenges etc... ~  CXR 4/16 showed norm heart size, clear lungs w/ some hyperinflation,  RLL AVM noted w/o change... 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 known GI telangiectasia as well without signif GI bleeding in the past- followed by [facility]... 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 extensive nasal problems as above... ~  labs 2/09 showed Hg= 17.0 ~  labs 2/10 showed Hg= 16.4 ~  CXR 3/11 is chr incr markings esp RLL- no change, NAD...  ~  Labs 6/11 showed Hg= 15.9 ~  Labs 4/12 showed Hg= 16.0 ~  CXR 10/12 showed mild biapical scarring, RLL AVM, NAD... ~  Labs 4/13 showed Hg= 15.2 ~  CXR 8/13 showed heart at upper lim of norm, increased markings & apical scarring, chr incr markings in RLL- AVM, NAD.[name]. 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 ~  CXR 3/15 showed normal heart size, clear lungs, mild apical scarring, right basilar density c/w AVM based on CT report 2004, NAD.[name]. ~  CXR 4/16 showed norm heart size, clear lungs w/ some hyperinflation, RLL AVM noted w/o change... 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  Hx of CHEST PAIN (ICD-786.50) - seen Sep09 w/ several recent bouts of CP while working in yard, washing car, etc... resolved w/ rest & Protonix... EKG showed WNL, prev NuclearStressTest 1/04 at [facility] was neg (no ischemia or infarction and EF= 61%)... Cardiac eval DrCrenshaw w/  2DEcho 10/09 showing norm LV- no regional wall motion abn, norm LVF w/ EF= 60%, mild dil RA/ RV;  and norm MYOVIEW (no ischemia or infarction, EF= 58%)... ~  10/14: repeat cardiac eval by DrCrenshaw due to [place] w/ stairs; EKG 10/14 showed SBrady, rate57, otherw wnl;  2DEcho 10/14 showed normal LV size & wall thickness, norm LVF w/ EF=55-60%, Gr1DD, mildly thickened AoV leaflets, mild MR;  ETT 11/14 showed 40mn on treadmill, hypertensive BP response, rare PVC, no ischemia, O2 sats dropped to 87% w/ exercise ~  EKG 3/15 showed NSR, rate87, low volt, NAD..Marland KitchenMarland Kitchen  Episode of AFLUTTER >> this occurred 06/2015 & notes from Cards ([facility]); placed on lose dose TOPROL-XL & titrated up to 232md... Holter monitor placed and results  pending...   HYPERCHOLESTEROLEMIA, MILD (ICD-272.0) - now on CRESTOR 60m/d>  Prev Rx w/ Simva40 but developed muscle cramping which resolved off the med. ~  FLP 6/08 showed TChol 208, TG 75, HDL 34, LDL 139... he preferred diet Rx- ~  FLP 2/09 showed TChol 172, TG 69, HDL 30, LDL 129... [place] ~  [place] on Crestor10 showed TChol 91, TG 49, HDL 28, LDL 54... rec- keep same. ~  FLP 2/10 on Crestor10 showed TChol 108, TG 56, HDL 31, LDL 66 ~  9/10:  we discussed changing Crestor10 to Simvastatin40 for $$$ reasons... ~  FClark3/11 on Simva40 showed TChol 105, TG 59, HDL 39, LDL 54... continue same. ~  FS[place]4/12 on Simva40 showed TChol 104, TG 62, HDL 29, LDL 63... C/o severe muscle cramps & wants to stop for 1107mocramping resolved! ~  Referred to [facility] & they restarted his Crestor at 72m4m> tol well so far... ~  FLP 10/12 on Cres5 showed TChol 114, TG 40, HDL 36, LDL 70 ~  FLP 2/13 on Cres5 showed TChol 124, TG 21, HDL 40, LDL 80 ~  FLP 4/14 on Cres5 showed TChol 119, TG 44, HDL 38, LDL 72  ~  6/14: pt stopped Cres5 on his own due to leg cramps & decided to hold statins for now w/ repeat FLP off meds in several months... ~  FLP 10/14  on diet alone showed TChol 155, TG 29, HDL 38, LDL 111  ~  FLP 4/15 on diet alone showed TChol 137, TG 30, HDL 36, LDL 95 ~  FLP 4/16 on diet alone showed TChol 146, TG 85, HDL 35, LDL 94  HYPOTHYROIDISM - on SYNTHROID 112m46m now; prev 1272mc24mdose was decreased 4/12... ~  labs 2/09 showed TSH= 0.69...  8/09 showed TSH= 0.44... ~  labs 2/10 showed TSH= 0.68 ~  labs 3/11 showed TSH= 0.52 ~  Labs 4/12 on Synthroid125 showed TSH= 0.33... We decided to decr the dose to 112/d. ~  Labs 4/13 on Levo112 showed TSH= 4.60 ~  Labs 4/14 on Levo112 showed TSH= 5.46 ~  Labs 4/15 on Levo112 showed TSH= 9.26 and we decided to incr dose back to 1272mcg69m. ~  Labs 4/16 on Levo125 showed TSH= 4.38  GERD, IRRITABLE BOWEL SYNDROME, COLONIC POLYPS - [facility] (he tried OTC Zantac 772mg o51ms own)... also takes  LIBRAX [place] colonoscopy 1/04 w/ divertics, diminutive rectal polyp (adenomatous), hems...  ~  f/u colon 1/07 showed no recurrent polyps... 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 doing well without LTOS, just complains of nocturia x 1-2 ~  labs 2/09 showed PSA= 0.76 ~  labs 2/10 showed PSA= 0.90 ~  2011 PSA checked by DrDavis (pt states it was OK)... ~  Labs 4/12 showed PSA= 1.22 ~  Labs 4/13 showed PSA= 1.31 ~  10/13: presents c/o incr freq, nocturia x2-4, & weak stream despite Rapaflo; he is intol to Flomax; refer to Urology for further eval=> seen by DrEskridge, no changes made... ~  1/15: f/u by Urology, DrEskridge> BPH, LUTS, hypersens unstable bladder; on Rapaflo8 & doing satis, PSA reported to be 2.34...  LEFT ARM & SHOULDER PAIN >> [facility] but not [facility] he says; we discussed  [facility] opinion at the [facility], DrSypher==> notes reviewed.  BACK PAIN,  LUMBAR (ICD-724.2) - he's had therapy in the past and not currently bothered by LBP... ~  9/10:  requesting Rx for Robaxin for muscle spasm as needed.  PLANTAR FASCIITIS (ICD-728.71) - c/o pain in his feet secondary to plantar fasciitis eval & Rx from [facility] w/ Celebrex & shots... ~  9/10: we discussed change to [facility] - symptoms improved.  Hx NOCTURNAL LEG CRAMPS >>  ~  This initially occurred several yrs ago while on Simvastatin & the cramps resolved off this med; he was subseq placed on Cres5 & tol well... ~  6/14: he indicates that noct leg cramps returned x1wk & he stopped the Cres5 w/ improvement; he wants to leave off the statins for now; rec to try tonic water/ yellow mustard prn...  Hx of DIZZINESS (ICD-780.4) - sudden episode dizziness Sep09 working at [employer] around [employer] w/ room spinning- lasted 15mn and resolved spontaneously, felt light headed afterwards... no LOC, syncope, seizure activity, etc... no CP/ palpit/ SOB/ etc... there were several EMTs in the restaurant and they checked him out- stable and transported to ER... 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  ANXIETY (ICD-300.00) & INSOMNIA, CHRONIC (ICD-307.42) - he uses CHLORAZEPATE 7.516mPrn & AMBIEN1059mhs for chronic persistant insomnia...  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  ANEMIA >>  ~  LABS 4/13 showed Hg= 15.2, MCV=91 ~  LABS 4/14 showed Hg= 14.5, MCV=84 ~  LABS 3/15 showed Hg= 13.6, MCV=78 ~  LABS 4/15 showed Hg= 11.3, MCV= 77, Iron studies showed Fe= 16 (3.6%sat), Ferritin= 6.4, & stool cards neg; Rx w/ FeSO4... ~  Labs 10/15 showed Hg= 16.5, MCV= 91, Fe=88 (23%sat), Ferritin= 36...  ~  Labs 4/16 showed Hg= 16.0  Health Maintenance: ~  [facility]   [facility]... (last 1/07 & 4yr84yr due 1/12). ~  GU:  followed by DrRDavis in past; PSAs have all been wnl... ~  Immunizations:  he gets yearly Flu vaccinations in the fall;  OK Pneumovax 03/12/10 at [age]- [age] 10  Past Surgical History:  Procedure Laterality Date  . APPLICATION OF CRANIAL NAVIGATION N/A 05/16/2015   Procedure: APPLICATION OF CRANIAL NAVIGATION;  Surgeon: NeelConsuella Lose;  [facility];  Service: Neurosurgery;  Laterality: N/A;  . BRAIN BIOPSY Left 05/16/2015   Procedure: Stereotactic Left Brain Biopsy with Brain Lab;  Surgeon: NeelConsuella Lose;  [place] [facility] [place];  Service: Neurosurgery;  Laterality: Left;  Stereotactic Left Brain biopsy with brainlab  . CARDIOVASCULAR STRESS TEST  01-14-2003   NO ISCHEMIA / EF 61%/ NORMAL LE WALL MOTION  . EXCISION LEFT WRIST GANGLIAN/ MYXOID CYST  05-30-2009  . 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 OTHER SURGICAL HISTORY     pulse laser for facial telangiectasias inthe past  . removal of skin cancer from forehead  10/2012   Dr. HaveRenda RollsTRANSTHORACIC ECHOCARDIOGRAM  10-17-2008   DR CRENSHAW   NORMAL LVF/ EF 60%/  MILDLY DILATED RIGHT ATRIUM/ VENTRICULE  . VARICOCELECTOMY  1991   Dr. RonaJori Moll  [place]    Outpatient Encounter Prescriptions as of 12/07/2016  Medication Sig  . acetaminophen (TYLENOL) 325 MG tablet Take 650 mg by mouth daily as needed (pain).  . Cholecalciferol (VITAMIN D3) 2000 UNITS TABS Take 2,000 Units by mouth daily.  . clidinium-chlordiazePOXIDE (LIBRAX) 5-2.5 MG capsule Take 1 capsule by mouth 3 (three) times daily as needed.  . clorazepate (TRANXENE) 7.5 MG tablet TAKE 1 TABLET BY MOUTH 3 TIMES A DAY AS NEEDED  . dexlansoprazole (DEXILANT) 60 MG capsule Take 1 capsule (60 mg total) by mouth daily.  . Diphenhyd-Hydrocort-Nystatin (FIRST-DUKES MOUTHWASH) SUSP 1 tsp gargle and swallow four times daily  .  levothyroxine (SYNTHROID, LEVOTHROID) 125 MCG tablet TAKE 1 TABLET BY MOUTH  DAILY BEFORE BREAKFAST  . loratadine (CLARITIN) 10 MG tablet Take 10 mg by mouth daily.  . Magnesium 250 MG TABS Take 250 mg by mouth at bedtime.   . meclizine (ANTIVERT) 25 MG tablet Take 1 tablet (25 mg total) by mouth daily as needed for dizziness.  . metoprolol succinate (TOPROL-XL) 25 MG 24 hr tablet Take 1 tablet (25 mg total) by mouth daily.  . Multiple Vitamin (MULTIVITAMIN WITH MINERALS) TABS tablet Take 1 tablet by mouth daily. Centrum  . silodosin (RAPAFLO) 8 MG CAPS capsule Take 1 capsule (8 mg total) by mouth at bedtime.  . [DISCONTINUED] clidinium-chlordiazePOXIDE (LIBRAX) 5-2.5 MG capsule Take 1 capsule by mouth 3 (three) times daily as needed.  . [DISCONTINUED] clorazepate (TRANXENE) 7.5 MG tablet TAKE 1 TABLET BY MOUTH 3 TIMES A DAY AS NEEDED  . [DISCONTINUED] dexlansoprazole (DEXILANT) 60 MG capsule Take 60 mg by mouth daily.  . [DISCONTINUED] levothyroxine (SYNTHROID, LEVOTHROID) 125 MCG tablet TAKE 1 TABLET BY MOUTH  DAILY BEFORE BREAKFAST  . [DISCONTINUED] metoprolol succinate (TOPROL-XL) 25 MG 24 hr tablet TAKE 1 TABLET BY MOUTH  DAILY  . [DISCONTINUED] pantoprazole (PROTONIX) 40 MG tablet Take 1 tablet (40 mg total) by mouth daily.  . [DISCONTINUED] RAPAFLO 8 MG CAPS capsule TAKE 1 CAPSULE BY MOUTH AT  BEDTIME  . [DISCONTINUED] amoxicillin-clavulanate (AUGMENTIN) 875-125 MG tablet Take 1 tablet by mouth 2 (two) times daily. (Patient not taking: Reported on 12/07/2016)  . [DISCONTINUED] benzonatate (TESSALON) 200 MG capsule Take 1 capsule (200 mg total) by mouth 3 (three) times daily as needed for cough. (Patient not taking: Reported on 12/07/2016)  . [DISCONTINUED] HYDROcodone-homatropine (HYDROMET) 5-1.5 MG/5ML syrup Take 5 mLs by mouth every 6 (six) hours as needed. (Patient not taking: Reported on 12/07/2016)  . [DISCONTINUED] mirabegron ER (MYRBETRIQ) 25 MG TB24 tablet Take 25 mg by mouth daily.   . [DISCONTINUED] predniSONE (DELTASONE) 10 MG tablet 4 tabs for 2 days, then 3 tabs for 2 days, 2 tabs for 2 days, then 1 tab for 2 days, then stop (Patient not taking: Reported on 12/07/2016)   No facility-administered encounter medications on file as of 12/07/2016.     Allergies  Allergen Reactions  . Nsaids Other (See Comments)    Cannot take-per MD  . Other Other (See Comments)    All blood thinners-cannot take per MD  . Aspirin Other (See Comments)    nosebleeds  . Statins Other (See Comments)    Weakness, myalgias     Current Medications, Allergies, Past Medical History, Past Surgical History, Family History, and Social History were reviewed in [employer] record.    Review of Systems       See HPI - other systems neg except as noted... The patient denies anorexia, fever,  weight loss, weight gain, vision loss, decreased hearing, hoarseness, chest pain, syncope, dyspnea on exertion, peripheral edema, prolonged cough, headaches, hemoptysis, abdominal pain, melena, hematochezia, severe indigestion/heartburn, hematuria, incontinence, muscle weakness, suspicious skin lesions, transient blindness, difficulty walking, depression, unusual weight change, abnormal bleeding, enlarged lymph nodes, and angioedema.     Objective:   Physical Exam      WD, WN, 75 y/o WM in NAD... 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 GENERAL:  Alert & oriented; pleasant & cooperative... HEENT:  [place]/AT, EOM-wnl, PERRLA, EACs-clear, TMs-wnl, NOSE- occluded left nares from skin grafts, MOUTH- mucosal telangiectasias.  NECK:  Supple w/ fairROM; no JVD; normal carotid impulses w/o bruits; no thyromegaly or nodules palpated; no lymphadenopathy. CHEST:  Clear to P & A; without wheezes/ rales/ or rhonchi. HEART:  Regular Rhythm; without murmurs/ rubs/ or gallops. ABDOMEN:  Soft & nontender; normal bowel sounds; no organomegaly or masses detected. EXT: without deformities, mild  arthritic changes; no varicose veins/ venous insuffic/ or edema. NEURO:  CN's intact; motor testing normal; sensory testing normal; gait normal & balance OK. DERM:  mult telangiectasias...  RADIOLOGY DATA:  Reviewed in the EPIC EMR & discussed w/ the patient...  LABORATORY DATA:  Reviewed in the EPIC EMR & discussed w/ the patient...   Assessment & Plan:    BRAIN ABSCESS >> [facility] 5/16, followed by ID, DrCampbell, finished antibiotic rx 07/2015 and f/u CT Brain=> resolved abscess... He will need antibiotic prophylaxis prior to future dental work...  Hx Cough, Dyspnea on exertion, mild COPD >> as above, prev on Dulera100- 2sp Bid & exercise program- improved; we will continue to monitor his status & O2 sats; Dyspnea improved w/ Fe for anemia & ret of Hg to norm... He has Tussionex prn cough... 8/16> c/o intermittent hoarseness & referred to ENT for eval of cords => we don't have note but pt indicates sl thinning of cords and they offered injections but he's holding off...  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     Hx CP, AFlutter>  Prev cardiac w/u by DrCrenshaw was neg, no recurrent CP complaints, but he notes DOE w/ stairs & DrCrenshaw repeated 2DEcho & ETT as above... Episode of PAFlutter 06/2015=> holter monitor per cards- pending & pt improved on ToprolXL25...      Chol>  Off statins due to recurrent cramps, on diet alone & FLP is reasonable...     Hypothy>  On Synthroid 166mg/d now>  He is clinically & biochem euthyroid...     [facility]>  [facility]- neg x for hems & DrM rec f/u 5104yr..     GU>  Hx BPH w/  BOO & followed by DrEskridge on Rapaflo 35m65m & stable...     Ortho>  See above- prev stable on OTC analgesics as needed, but recent incr symptoms- try Tramadol50 prn...  LEG CRAMPS >> he is quite sure they are from his statin Rx & we decided to leave these off for now, try to control on diet/ exercise & continued monitoring... 8/16> c/o numbness in toes & bottom of feet 8/16>  Refer to Neuro for  neuropathy eval...     Anxiety/ Insomnia>  He has Chlorazepate for Prn use, and Ambien to use for sleep Qhs...  Anemia>  On FeSO4 supplement & Hg now back to normal, dyspnea ret to baseline...   MULT MEDICATION REFILLS WERE PROVIDED TODAT AT Pt's REQUEST >>  Patient's Medications  New Prescriptions   No medications on file  Previous Medications   ACETAMINOPHEN (TYLENOL) 325 MG TABLET    Take 650 mg by mouth daily as needed (pain).   CHOLECALCIFEROL (VITAMIN D3) 2000 UNITS TABS    Take 2,000 Units by mouth daily.   DIPHENHYD-HYDROCORT-NYSTATIN (FIRST-DUKES MOUTHWASH) SUSP    1 tsp gargle and swallow four times daily   LORATADINE (CLARITIN) 10 MG TABLET    Take 10 mg by mouth daily.   MAGNESIUM 250 MG TABS    Take 250 mg by mouth at bedtime.    MECLIZINE (ANTIVERT) 25 MG TABLET    Take 1 tablet (25 mg total) by mouth daily as needed for dizziness.   MULTIPLE VITAMIN (MULTIVITAMIN WITH MINERALS) TABS TABLET    Take 1 tablet by mouth daily. Centrum  Modified Medications   Modified Medication Previous Medication   CLIDINIUM-CHLORDIAZEPOXIDE (LIBRAX) 5-2.5 MG CAPSULE clidinium-chlordiazePOXIDE (LIBRAX) 5-2.5 MG capsule      Take 1 capsule by mouth 3 (three) times daily as needed.    Take 1 capsule by mouth 3 (three) times daily as needed.   CLORAZEPATE (TRANXENE) 7.5 MG TABLET clorazepate (TRANXENE) 7.5 MG tablet      TAKE 1 TABLET BY MOUTH 3 TIMES A DAY AS NEEDED    TAKE 1 TABLET BY MOUTH 3 TIMES A DAY AS NEEDED   DEXLANSOPRAZOLE (DEXILANT) 60 MG CAPSULE dexlansoprazole (DEXILANT) 60 MG capsule       Take 1 capsule (60 mg total) by mouth daily.    Take 60 mg by mouth daily.   LEVOTHYROXINE (SYNTHROID, LEVOTHROID) 125 MCG TABLET levothyroxine (SYNTHROID, LEVOTHROID) 125 MCG tablet      TAKE 1 TABLET BY MOUTH  DAILY BEFORE BREAKFAST    TAKE 1 TABLET BY MOUTH  DAILY BEFORE BREAKFAST   METOPROLOL SUCCINATE (TOPROL-XL) 25 MG 24 HR TABLET metoprolol succinate (TOPROL-XL) 25 MG 24 hr tablet      Take 1 tablet (25 mg total) by mouth daily.    TAKE 1 TABLET BY MOUTH  DAILY   SILODOSIN (RAPAFLO) 8 MG CAPS CAPSULE RAPAFLO 8 MG CAPS capsule      Take 1 capsule (8 mg total) by mouth at bedtime.    TAKE 1 CAPSULE BY MOUTH AT  BEDTIME  Discontinued Medications   AMOXICILLIN-CLAVULANATE (AUGMENTIN) 875-125 MG TABLET    Take 1 tablet by mouth 2 (two) times daily.   BENZONATATE (TESSALON) 200 MG CAPSULE    Take 1 capsule (200 mg total) by mouth 3 (three) times daily as needed for cough.   HYDROCODONE-HOMATROPINE (HYDROMET) 5-1.5 MG/5ML SYRUP    Take 5 mLs by mouth every 6 (six) hours as needed.   MIRABEGRON ER (MYRBETRIQ) 25 MG TB24 TABLET    Take 25 mg by mouth daily.   PANTOPRAZOLE (PROTONIX) 40 MG TABLET    Take 1 tablet (40 mg total) by mouth daily.   PREDNISONE (DELTASONE) 10 MG TABLET    4 tabs for 2 days, then 3 tabs for 2 days, 2 tabs for 2 days, then 1 tab for 2 days, then stop

## 2017-01-13 ENCOUNTER — Ambulatory Visit (INDEPENDENT_AMBULATORY_CARE_PROVIDER_SITE_OTHER): Payer: Medicare Other | Admitting: Neurology

## 2017-01-13 ENCOUNTER — Encounter: Payer: Self-pay | Admitting: Neurology

## 2017-01-13 VITALS — BP 110/60 | HR 60 | Ht 70.0 in | Wt 185.6 lb

## 2017-01-13 DIAGNOSIS — R531 Weakness: Secondary | ICD-10-CM | POA: Diagnosis not present

## 2017-01-13 DIAGNOSIS — H5462 Unqualified visual loss, left eye, normal vision right eye: Secondary | ICD-10-CM

## 2017-01-13 DIAGNOSIS — G060 Intracranial abscess and granuloma: Secondary | ICD-10-CM

## 2017-01-13 DIAGNOSIS — R252 Cramp and spasm: Secondary | ICD-10-CM | POA: Diagnosis not present

## 2017-01-13 MED ORDER — BACLOFEN 10 MG PO TABS: 10.0000 mg | ORAL_TABLET | Freq: Two times a day (BID) | ORAL | 5 refills | Status: DC

## 2017-01-13 NOTE — Progress Notes (Signed)
Follow-up Visit   Date: 01/13/17   Gregory [name] MRN: [id number] DOB: 1941/09/09   Interim History: Gregory [name] is a 76 y.o. right-handed Caucasian male with hypothyroidism, hyperlipidemia, Osler-Weber- Rendu disease, GERD, and recently diagnosed brain abscess returning to the clinic for follow-up of bilateral feet paresthesias, right leg weakness and left blurred vision.  The patient was accompanied to the clinic by self.  History of present illness: He was hospitalized from May 24 - May 21 2015 with right sided leg weakness and headaches. Patient was subsequently found to have a brain mass 3 x 2 cm involving the basal ganglia with midline shift and edema seen on CT head. 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 IV antibiotics including vancomycin, rocephin, and flagyl was started. He was also started on keppra 500mg  BID. Of note, he had routine dental work 2 weeks prior to symptom onset.  He has completed his course of antibiotics therapy with IV for 6 weeks and transitioned to oral levofloxacin and metronidazole for 4 weeks. He has been off antibiotics since early August. He has been doing well and complains of mild right leg weakness. He is scheduled for repeat CT brain in mid-August.   Starting ~ late July 2016, he started noticing numbness/tingling of the toes and shooting pain involving the sole of the foot. Symptoms are constant and involve both feet equally. Shooting pain is worse with weight bearing. Nothing alleviates the pain. He endorses mild low back pain.  UPDATE 09/11/2015:  He no longer has tingling sensation of leg, but continues to have persistent numbness of the feet. Gabapentin was stopped because of GI side effects.  Physical therapy has significantly improved his leg strength, which he feels is back to baseline.  He has developed hoarseness and has seen ENT, who noted thin cords.  His hand writing  is slowly improving, but still not completely improved. Interval CT head from August shows resolving abscess of the left external capsule with reduced edema.   UPDATE 01/12/2016:   He complains of intermittent slurred speech, occuring several times per week.  There is no pattern that he had identified with his speech.  No word-finding difficulty.  Over the holidays, he was not compliant with his home exercises and restarted this and noticed that his right leg seems slightly weaker than before.  No recent falls.  He continues to have persistent numbness of the feet.  He also complains of cold hands.  Denies double vision, dysphagia.    UPDATE 08/25/2016:  He went for annual eye exam and was alarmed that he was unable to read the letters on the Snellen chart, which was surprising because he did not notice and vision changes. He saw his ophthalmologist who is planning on doing a formal visual field test next week.  No headaches, double vision, pain with eye movement.  He states that he has had precancerous lesions removed from his left eyebrow.  His numbness and tingling of the feet has nearly resolved.  He denies peripheral vision changes or vision loss involving the right eye.   UPDATE 01/13/2017:  He was found to have a new cataract involving his left eye and is seeing an ophthalmologist for this on February 20th.  He denies any eye pain and double vision.  No similar symptoms involving his right eye. He feels that his right leg weakness has worsened over the past 2 months.  His leg feels heavier  and appears to be dragging his leg.  He denies any falls and walks unassisted.  He continues to have numbness and tingling of the toes, and feels this has improved some.  He also complains of handwriting changes, specifically writing has become much smaller and illegible at times.  Medications:  Current Outpatient Prescriptions on File Prior to Visit  Medication Sig Dispense Refill  . acetaminophen (TYLENOL) 325 MG  tablet Take 650 mg by mouth daily as needed (pain).    . Cholecalciferol (VITAMIN D3) 2000 UNITS TABS Take 2,000 Units by mouth daily.    . clidinium-chlordiazePOXIDE (LIBRAX) 5-2.5 MG capsule Take 1 capsule by mouth 3 (three) times daily as needed. 60 capsule 5  . clorazepate (TRANXENE) 7.5 MG tablet TAKE 1 TABLET BY MOUTH 3 TIMES A DAY AS NEEDED 90 tablet 5  . Diphenhyd-Hydrocort-Nystatin (FIRST-DUKES MOUTHWASH) SUSP 1 tsp gargle and swallow four times daily 120 mL 5  . levothyroxine (SYNTHROID, LEVOTHROID) 125 MCG tablet TAKE 1 TABLET BY MOUTH  DAILY BEFORE BREAKFAST 90 tablet 3  . loratadine (CLARITIN) 10 MG tablet Take 10 mg by mouth daily.    . Magnesium 250 MG TABS Take 250 mg by mouth at bedtime.     . meclizine (ANTIVERT) 25 MG tablet Take 1 tablet (25 mg total) by mouth daily as needed for dizziness. 30 tablet 2  . metoprolol succinate (TOPROL-XL) 25 MG 24 hr tablet Take 1 tablet (25 mg total) by mouth daily. 90 tablet 3  . Multiple Vitamin (MULTIVITAMIN WITH MINERALS) TABS tablet Take 1 tablet by mouth daily. Centrum    . silodosin (RAPAFLO) 8 MG CAPS capsule Take 1 capsule (8 mg total) by mouth at bedtime. 90 capsule 3  . dexlansoprazole (DEXILANT) 60 MG capsule Take 1 capsule (60 mg total) by mouth daily. (Patient not taking: Reported on 01/13/2017) 90 capsule 3   No current facility-administered medications on file prior to visit.     Allergies:  Allergies  Allergen Reactions  . Nsaids Other (See Comments)    Cannot take-per MD  . Other Other (See Comments)    All blood thinners-cannot take per MD  . Aspirin Other (See Comments)    nosebleeds  . Statins Other (See Comments)    Weakness, myalgias    Review of Systems:  CONSTITUTIONAL: No fevers, chills, night sweats, or weight loss.  EYES: +visual changes or eye pain ENT: No hearing changes.  No history of nose bleeds.   RESPIRATORY: No cough, wheezing and shortness of breath.   CARDIOVASCULAR: Negative for chest pain,  and palpitations.   GI: Negative for abdominal discomfort, blood in stools or black stools.  No recent change in bowel habits.   GU:  No history of incontinence.   MUSCLOSKELETAL: No history of joint pain or swelling.  No myalgias.   SKIN: Negative for lesions, rash, and itching.   ENDOCRINE: Negative for cold or heat intolerance, polydipsia or goiter.   PSYCH:  No depression or anxiety symptoms.   NEURO: As Above.   Vital Signs:  BP 110/60   Pulse 60   Ht 5\' 10"  (1.778 m)   Wt 185 lb 9 oz (84.2 kg)   SpO2 95%   BMI 26.63 kg/m   Gen.: Well appearing, in no acute distress Cardiovascular: Regular rate and rhythm Pulm: Clear to auscultation Ext:  No edema  Neurological Exam: MENTAL STATUS including orientation to time, place, person, recent and remote memory, attention span and concentration, language, and fund of knowledge is  normal.  Speech is not dysarthric.  CRANIAL NERVES:  Normal fundoscopic exam.  Pupils equal round and reactive to light.  Normal conjugate, extra-ocular eye movements in all directions of gaze.  Visual fields are grossly intact. Right ptosis (old).  Face is symmetric and muscles are intact. Palate elevates symmetrically.  Tongue is midline.  MOTOR:  Motor strength is 5/5 in all extremities.   No pronator drift.  There is evidence of spasticity, worse on the right side. RUE 0+, RLE 1+, LUE 0, LLE 0+  MSRs:  Reflexes are 3+/4 throughout, brisker on the right side.  SENSORY:  Vibration is reduced at the ankles bilaterally (50%), intact at the knees.    COORDINATION/GAIT:  Normal finger-to- nose-finger. Intact rapid alternating movements bilaterally, subtle reduced amplitude of finger tapping on right.  Gait narrow based and stable. Stressed and tandem gait intact.  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  Stable 4 mm LEFT-to-RIGHT midline shift.  CT head 06/24/2015:  Significant interval improvement when compared to the 05/13/2015 examination.  The ring-enhancing lesion which was centered in the left basal ganglia has decreased significantly in size. Currently, 2.3 x 0.9 cm region of relatively solid enhancement superior aspect of the left basal ganglia remains as versus prior 3.1 x 1.9 cm ring-enhancing lesion. Significant decrease in surrounding vasogenic edema and marked decrease mass effect upon the left lateral ventricle. What remains may represent residua of treated abscess. This will need to be followup until complete clearance. As this clears it would be helpful to exclude the possibility of underlying vascular abnormality in this patient with Osler-Weber-Rendu.  CT head 08/11/2015: Resolving abscess left external capsule. There is decreased edema and decreased enhancement in this area. No residual fluid collection. No other areas of acute abnormality.  CT head wwo contrast 01/06/2016:  Hypoattenuation in the LEFT basal ganglia and regional white matter is improved from August but not yet normalized. No abnormal postcontrast enhancement to definitively suggest residual infection. Continued surveillance may be warranted, given the patient's residual RIGHT leg weakness and intermittent slurred speech.  NCS/EMG of the legs 09/16/2015: 1. The electrophysiologic findings are most consistent with a generalized sensorimotor polyneuropathy, axon loss in type, affecting the lower extremities. Overall, these findings are severe in degree electrically with respect to sensory responses. 2. 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  Labs 09/11/2015:  vitamin B12 874, folate 24.8, vitamin B1 36, copper 77, vitamin B6 73.8, myasthenia panel  negative   IMPRESSION/PLAN: 1. Right leg weakness and handwriting changes are due to increased spasticity from his previously treated left basal ganglia polymicrobial abscess (May 2016)  - I do not observe any weakness on his exam but there is increased tone  - Start baclofen 5 mg twice daily and titrate to 10 mg twice daily   - I offered to start physical therapy for stretching, however he has been meaning to start yoga, with his wife gym so will try this first   - Repeat CT head with and without contrast to be sure there is no other process contributing to his weakness and vision changes.MRI cannot be performed due to metal in his face.    2.  Left monocular blurry vision due to cataracts. He is being followed by ophthalmology  and will be scheduled for surgery.  3.  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  Return to clinic in 6 months    The duration of this appointment visit was 30 minutes of face-to-face time with the patient.  Greater than 50% of this time was spent in counseling, explanation of diagnosis, planning of further management, and coordination of care.   Thank you for allowing me to participate in patient's care.  If I can answer any additional questions, I would be pleased to do so.    Sincerely,    Donika K. Posey Pronto, DO

## 2017-01-13 NOTE — Patient Instructions (Addendum)
1. Start Baclofen 10 mg tablets    Morning            Evening  Week 1 1/2 tab                                1/2 tab              Week 2 1/2 tab                             1 tab              Week 3 1 tab                     1 tab       2.  Start yoga and stretching program  3.  Please call my office if you choose to move forward with physical therapy  4.  CT head wwo contrast       Return to clinic 6 months

## 2017-01-21 ENCOUNTER — Ambulatory Visit
Admission: RE | Admit: 2017-01-21 | Discharge: 2017-01-21 | Disposition: A | Discharge status: Home / Self Care | Payer: Medicare Other | Source: Ambulatory Visit | Attending: Neurology | Admitting: Neurology

## 2017-01-21 DIAGNOSIS — H5462 Unqualified visual loss, left eye, normal vision right eye: Secondary | ICD-10-CM

## 2017-01-21 DIAGNOSIS — R531 Weakness: Secondary | ICD-10-CM

## 2017-01-21 DIAGNOSIS — R252 Cramp and spasm: Secondary | ICD-10-CM

## 2017-01-21 DIAGNOSIS — G060 Intracranial abscess and granuloma: Secondary | ICD-10-CM

## 2017-01-21 MED ORDER — IOPAMIDOL (ISOVUE-300) INJECTION 61%
75.0000 mL | Freq: Once | INTRAVENOUS | Status: AC | PRN
  Administered 2017-01-21: 75 mL via INTRAVENOUS

## 2017-01-26 ENCOUNTER — Telehealth: Payer: Self-pay | Admitting: Neurology

## 2017-01-26 NOTE — Telephone Encounter (Signed)
Please advise 

## 2017-01-26 NOTE — Telephone Encounter (Signed)
Gregory [name] 2041-09-12. He called needing to see if there is any harm in stopping taking the Baclofen. He said it makes him so sleepy, even with 1/2 a tablet. His # is [id number] Thank you

## 2017-01-27 NOTE — Telephone Encounter (Signed)
Ok to stop bacofen, it was started for the tightness and his hand and right leg.  Please encourage him to do his stretching exercises at home and to let us know if he would like to go to PT.    Thanks.  Donika K. Posey Pronto, DO

## 2017-01-27 NOTE — Telephone Encounter (Signed)
Left message giving patient information and instructions. 

## 2017-02-01 ENCOUNTER — Telehealth: Payer: Self-pay | Admitting: Pulmonary Disease

## 2017-02-01 MED ORDER — PANTOPRAZOLE SODIUM 40 MG PO TBEC: 40.0000 mg | DELAYED_RELEASE_TABLET | Freq: Every day | ORAL | 3 refills | Status: DC

## 2017-02-01 NOTE — Telephone Encounter (Signed)
Spoke with pt. He needs a refill of Pantoprazole. Rx has been sent in. Nothing further was needed.

## 2017-02-08 ENCOUNTER — Other Ambulatory Visit: Payer: Self-pay | Admitting: Pulmonary Disease

## 2017-02-08 NOTE — Telephone Encounter (Signed)
SN please advise if ok to send in a refill of the amox for this pt. He stated that he normally takes amox prior to procedures and he will be having cataract surgery and wanted to have this sent in to the pharmacy.     Allergies  Allergen Reactions  . Nsaids Other (See Comments)    Cannot take-per MD  . Other Other (See Comments)    All blood thinners-cannot take per MD  . Aspirin Other (See Comments)    nosebleeds  . Statins Other (See Comments)    Weakness, myalgias

## 2017-02-08 NOTE — Telephone Encounter (Signed)
Per SN--amox isn't needed prior to cataract surgery.  Spoke with pt's spouse and made her aware of SN's above message. [name] states she will relay message to pt. Nothing further needed.

## 2017-02-09 NOTE — Progress Notes (Signed)
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  Admitted 5/16 with headache and right lower extremity weakness. CT of the head demonstrated a brain mass measuring 3 x 2 cm with midline shift and edema. This was a ring enhancing mass in the left basal ganglia. He underwent stereotactic biopsy by neurosurgery. He was diagnosed with a brain abscess. Notes from ID indicate his cultures grew out Peptostreptococcus sp. Patient was followed by infectious disease in the hospital and placed on vancomycin, Rocephin and Flagyl. This was to be continued for one month post discharge. He was also placed on Keppra for seizure prophylaxis. 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   Following DC, patient had an episode of elevated heart rate. EMS was called and he was noted to be in atrial flutter. This converted to sinus rhythm spontaneously. Patient placed on toprol. Event monitor showed slow atrial flutter versus ectopic atrial tachycardia. Echocardiogram August 2016 showed normal LV systolic function, grade 2 diastolic dysfunction. Since he was last seen patient states he has had occasional pain in the right side of his chest. It is described as a burning sensation that last 1-2 minutes. It is not pleuritic. He can increase after eating. He does not have exertional chest pain and there are no associated symptoms. He denies dyspnea, palpitations or syncope.  Current Outpatient Prescriptions  Medication Sig Dispense Refill  . acetaminophen (TYLENOL) 325 MG tablet Take 650 mg by mouth daily as needed (pain).    . Cholecalciferol (VITAMIN D3) 2000 UNITS TABS Take 2,000 Units by mouth daily.    . clidinium-chlordiazePOXIDE (LIBRAX) 5-2.5 MG capsule Take 1 capsule by mouth 3 (three) times daily as needed. 60 capsule 5  . clorazepate (TRANXENE) 7.5 MG  tablet TAKE 1 TABLET BY MOUTH 3 TIMES A DAY AS NEEDED 90 tablet 5  . Diphenhyd-Hydrocort-Nystatin (FIRST-DUKES MOUTHWASH) SUSP 1 tsp gargle and swallow four times daily 120 mL 5  . levothyroxine (SYNTHROID, LEVOTHROID) 125 MCG tablet TAKE 1 TABLET BY MOUTH  DAILY BEFORE BREAKFAST 90 tablet 3  . loratadine (CLARITIN) 10 MG tablet Take 10 mg by mouth daily.    . Magnesium 250 MG TABS Take 250 mg by mouth at bedtime.     . meclizine (ANTIVERT) 25 MG tablet Take 1 tablet (25 mg total) by mouth daily as needed for dizziness. 30 tablet 2  . metoprolol succinate (TOPROL-XL) 25 MG 24 hr tablet Take 1 tablet (25 mg total) by mouth daily. 90 tablet 3  . Multiple Vitamin (MULTIVITAMIN WITH MINERALS) TABS tablet Take 1 tablet by mouth daily. Centrum    . pantoprazole (PROTONIX) 40 MG tablet Take 1 tablet (40 mg total) by mouth daily. 90 tablet 3  . silodosin (RAPAFLO) 8 MG CAPS capsule Take 1 capsule (8 mg total) by mouth at bedtime. 90 capsule 3   No current facility-administered medications for this visit.      Past Medical History:  Diagnosis Date  . Anxiety state, unspecified   . Arthritis   . Benign neoplasm of colon   . GERD (gastroesophageal reflux disease)   . 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 History of nonmelanoma skin cancer EXCISION SQUAMOUS CELL FROM HAND  . Hypertrophy of prostate with urinary obstruction and other lower urinary tract symptoms (LUTS)   . Irritable bowel syndrome   . Lumbago   .  Other diseases of nasal cavity and sinuses(478.19)   . Persistent disorder of initiating or maintaining sleep   . Plantar fascial fibromatosis   . Pure hypercholesterolemia   . Telangiectasia, hereditary hemorrhagic, of Rendu, Osler and Weber ([place]) OLSER'S DISEASE  (OWR)   SKIN, LIPS, NASAL W/ PREVIOUS NOSE BLEEDS  AMD GI TELANGIECTASIA  . Unspecified hypothyroidism     Past Surgical History:  Procedure Laterality Date  . APPLICATION OF  CRANIAL NAVIGATION N/A 05/16/2015   Procedure: APPLICATION OF CRANIAL NAVIGATION;  Surgeon: Consuella Lose, MD;  Location: [facility];  Service: Neurosurgery;  Laterality: N/A;  . BRAIN BIOPSY Left 05/16/2015   Procedure: Stereotactic Left Brain Biopsy with Brain Lab;  Surgeon: Consuella Lose, MD;  Location: [facility];  Service: Neurosurgery;  Laterality: Left;  Stereotactic Left Brain biopsy with brainlab  . CARDIOVASCULAR STRESS TEST  01-14-2003   NO ISCHEMIA / EF 61%/ NORMAL LE WALL MOTION  . EXCISION LEFT WRIST GANGLIAN/ MYXOID CYST  05-30-2009  . 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 OTHER SURGICAL HISTORY     pulse laser for facial telangiectasias inthe past  . removal of skin cancer from forehead  10/2012   Dr. Renda Rolls  . TRANSTHORACIC ECHOCARDIOGRAM  10-17-2008   DR CRENSHAW   NORMAL LVF/ EF 60%/  MILDLY DILATED RIGHT ATRIUM/ VENTRICULE  . VARICOCELECTOMY  1991   Dr. Tresa Endo    Social History   Social History  . Marital status: Married    Spouse name: [name]  . Number of children: 2  . Years of education: N/A   Occupational History  .      Consultant   Social History Main Topics  . Smoking status: Never Smoker  . Smokeless tobacco: Never Used  . Alcohol use No  . Drug use: No  . Sexual activity: Not on file   Other Topics Concern  . Not on file   Social History Narrative   Lives with wife in a one story home.  Has 2 sons.  Retired Veterinary surgeon.  Education: Masters degree.    Family History  Problem Relation Age of Onset  . Diabetes Mother   . Hypertension Mother   . Pancreatic cancer Mother   . Stroke Mother   . Kidney failure Father   . Hypertension Sister   . Healthy Son     ROS: no fevers or chills, productive cough, hemoptysis, dysphasia, odynophagia, melena, hematochezia, dysuria, hematuria, rash, seizure activity, orthopnea, PND, pedal edema, claudication. Remaining systems are  negative.  Physical Exam: Well-developed well-nourished in no acute distress.  Skin is warm and dry.  HEENT is normal.  Neck is supple. No bruits Chest is clear to auscultation with normal expansion.  Cardiovascular exam is regular rate and rhythm.  Abdominal exam nontender or distended. No masses palpated. Extremities show no edema. neuro grossly intact  ECG-sinus rhythm at a rate of 67. No ST changes.  A/P  1 Atrial flutter versus ectopic atrial tachycardia-patient with a documented episode previously. He has not had further symptoms. Continue Toprol 25 mg daily. We have elected not to anticoagulate given his history of Osler Weber Rendu with AV malformations and GI bleeding as well as prior brain abscess.  2 prior brain abscess-previous echocardiogram showed no vegetations. This has now resolved and his neurological status improved.  3 hyperlipidemia-management per primary care.  4 chest pain-patient has had brief chest pain that can  occur after eating. He can climb 2 flights of stairs and walk blocks without having chest pain. He can also walk on a treadmill for 20-25 minutes with no chest pain. His electrocardiogram is normal. Pain does not sound to be cardiac. We can pursue further ischemia evaluation if his symptoms worsen.  Kirk Ruths, MD

## 2017-02-15 ENCOUNTER — Encounter: Payer: Self-pay | Admitting: Cardiology

## 2017-02-15 ENCOUNTER — Ambulatory Visit: Payer: Medicare Other | Admitting: Cardiology

## 2017-02-15 ENCOUNTER — Ambulatory Visit (INDEPENDENT_AMBULATORY_CARE_PROVIDER_SITE_OTHER): Payer: Medicare Other | Admitting: Cardiology

## 2017-02-15 VITALS — BP 90/46 | HR 67 | Ht 70.0 in | Wt 162.2 lb

## 2017-02-15 DIAGNOSIS — I483 Typical atrial flutter: Secondary | ICD-10-CM | POA: Diagnosis not present

## 2017-02-15 DIAGNOSIS — R072 Precordial pain: Secondary | ICD-10-CM | POA: Diagnosis not present

## 2017-02-15 NOTE — Patient Instructions (Signed)
Your physician recommends that you continue on your current medications as directed. Please refer to the Current Medication list given to you today.   Your physician wants you to follow-up in: [place] DR. CRENSHAW. You will receive a reminder letter in the mail two months in advance. If you don't receive a letter, please call our office to schedule the follow-up appointment.

## 2017-02-21 ENCOUNTER — Ambulatory Visit: Payer: Medicare Other | Admitting: Neurology

## 2017-04-19 IMAGING — CT CT HEAD W/ CM
1 series · 15 of 30 positions shown, 19 images · IV contrast (omnipaque)
Comparison: CT head May 13, 2015 at 4664 hours and CT Korey Ressler

CLINICAL DATA: One-day history of RIGHT-sided weakness, mild
headache. History of AVM, Rob.

EXAM:
CT HEAD WITH CONTRAST
TECHNIQUE: Contiguous axial images were obtained from the base of the skull
through the vertex with intravenous contrast.
CONTRAST:  100mL OMNIPAQUE IOHEXOL 300 MG/ML  SOLN

[Series 2: head 1.0 h30s · axial · 0.49mm/px · z∈[-175,-11]mm · 15 of 178 slices shown, 19 images]
[im 7/178  brain]
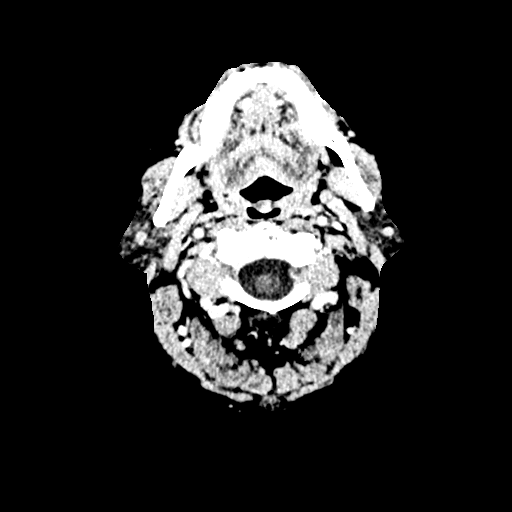
[im 7/178  bone]
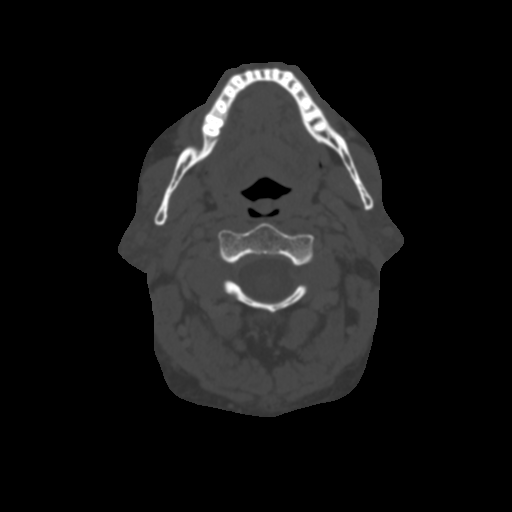
[im 19/178  brain]
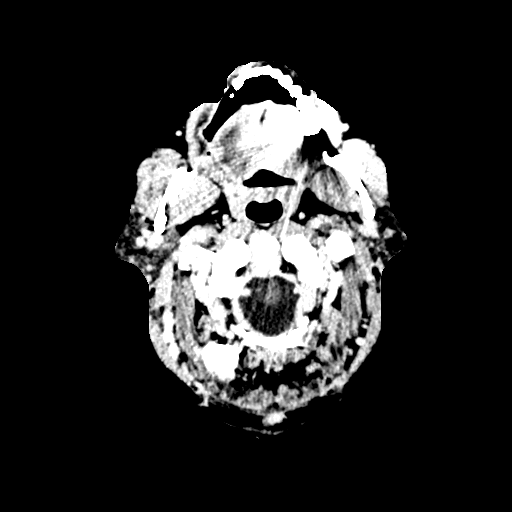
[im 31/178  brain]
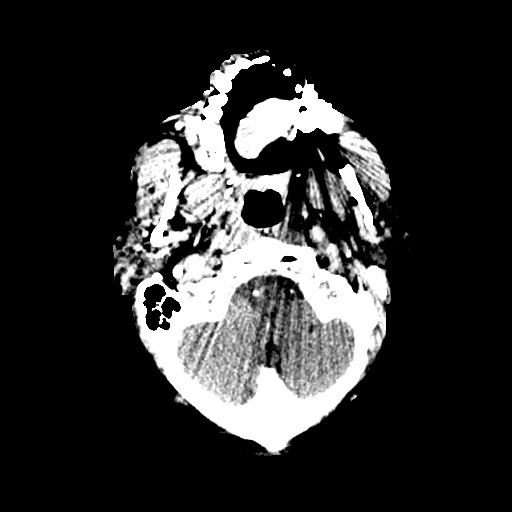
[im 43/178  brain]
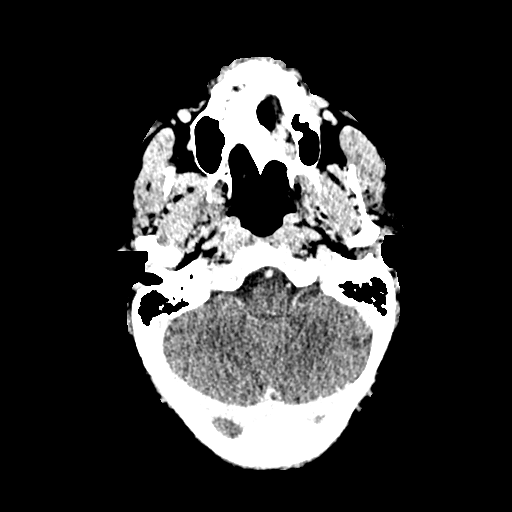
[im 55/178  brain]
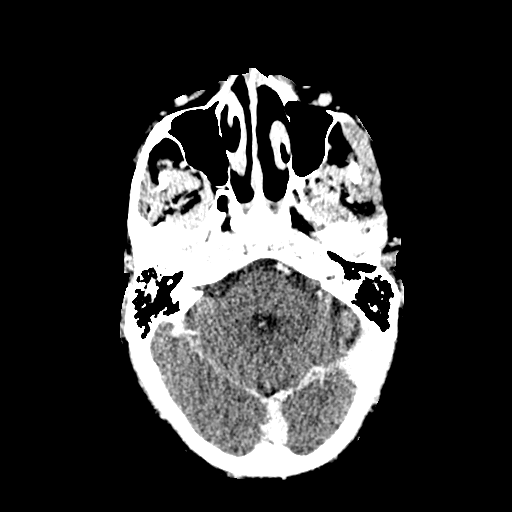
[im 55/178  bone]
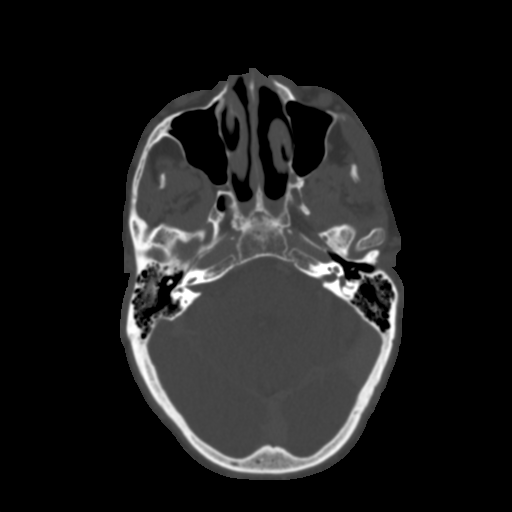
[im 68/178  brain]
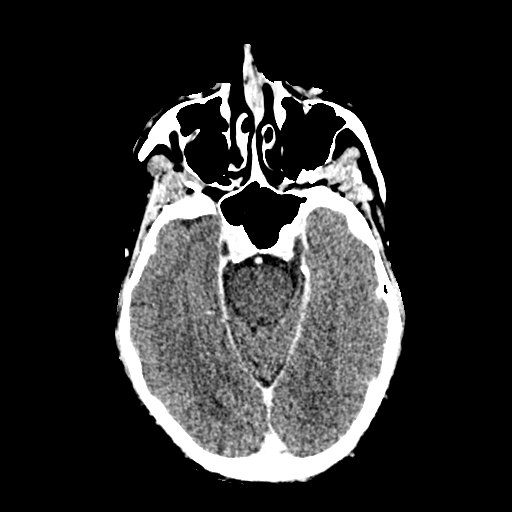
[im 80/178  brain]
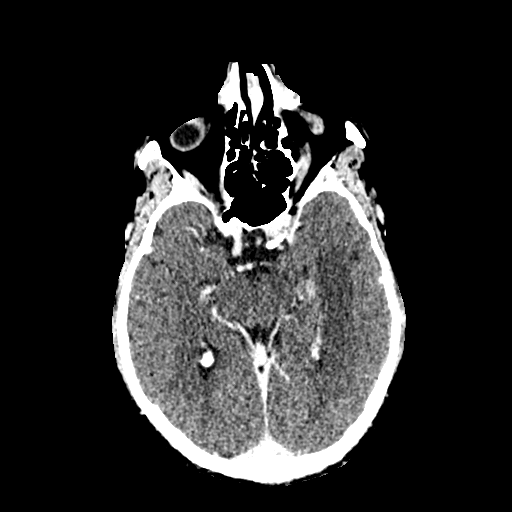
[im 92/178  brain]
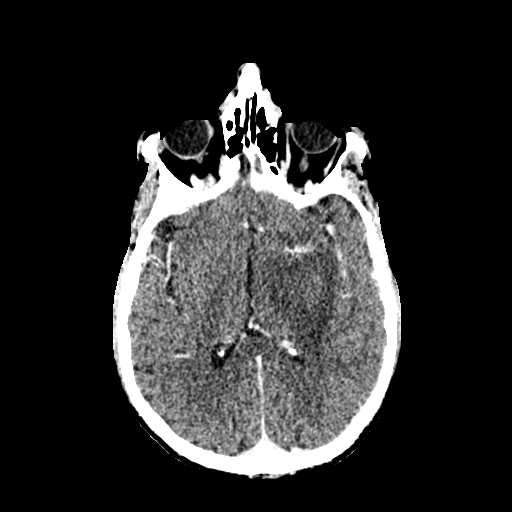
[im 98/178  brain]
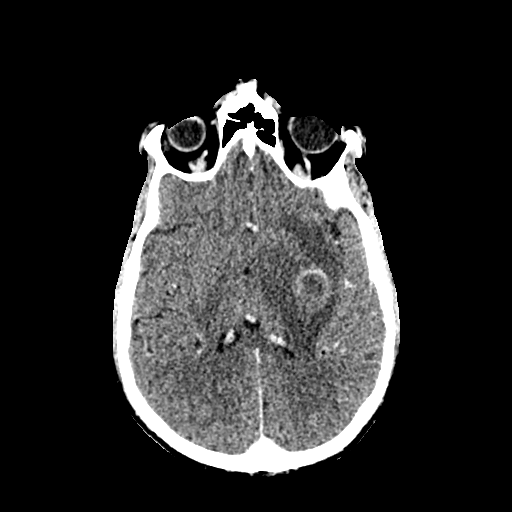
[im 98/178  bone]
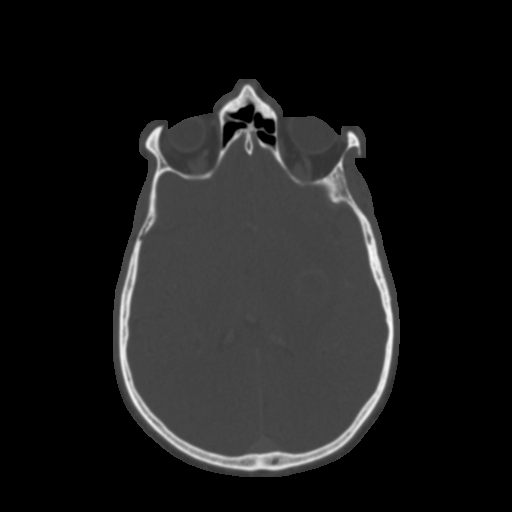
[im 110/178  brain]
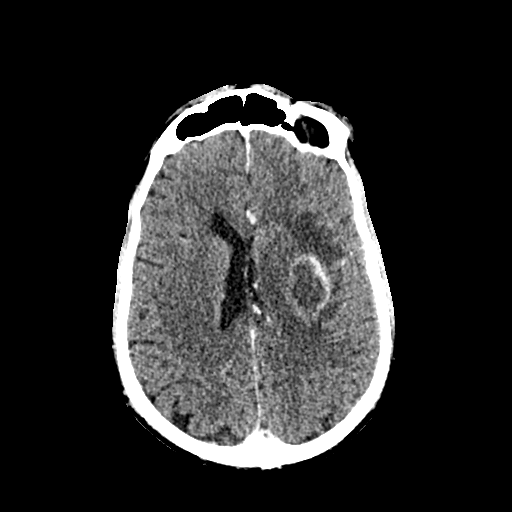
[im 123/178  brain]
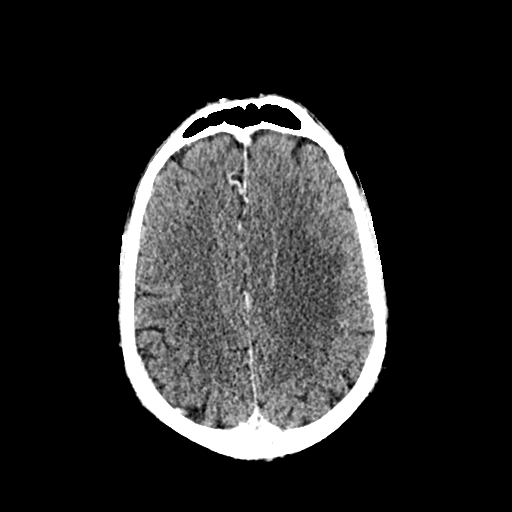
[im 135/178  brain]
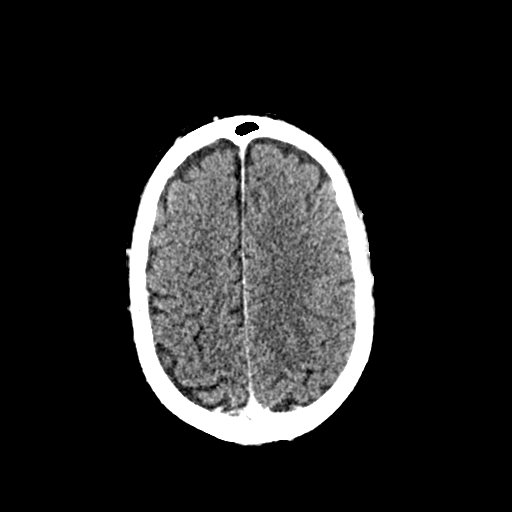
[im 147/178  brain]
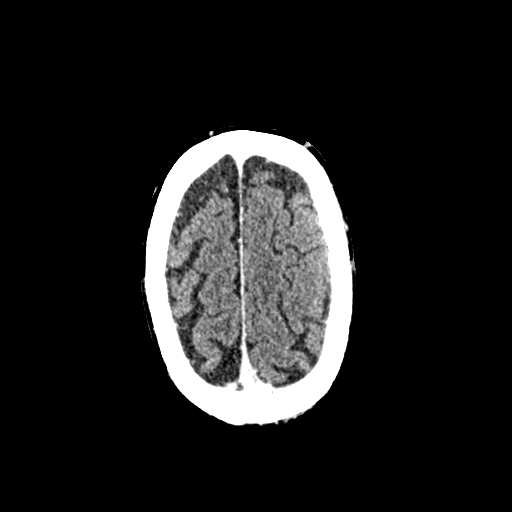
[im 147/178  bone]
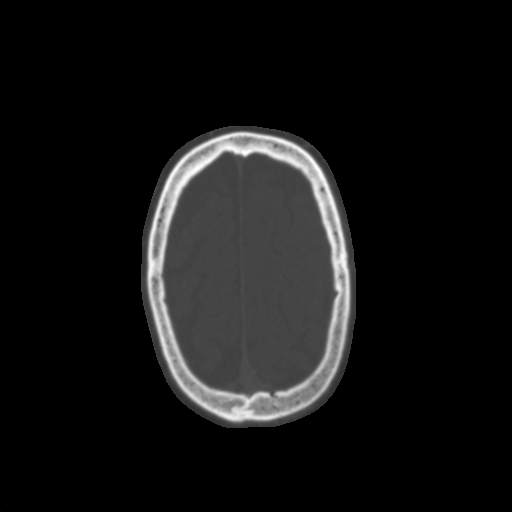
[im 159/178  brain]
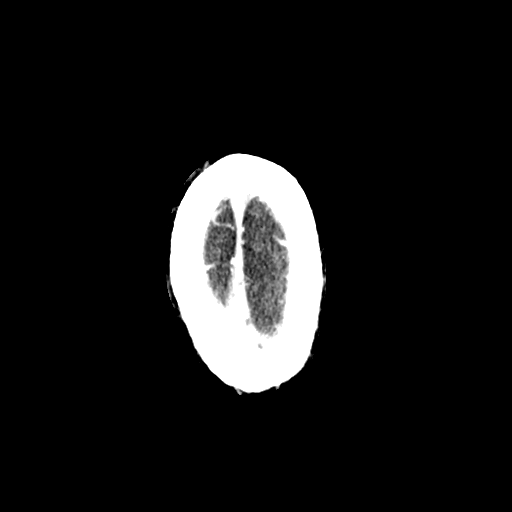
[im 171/178  brain]
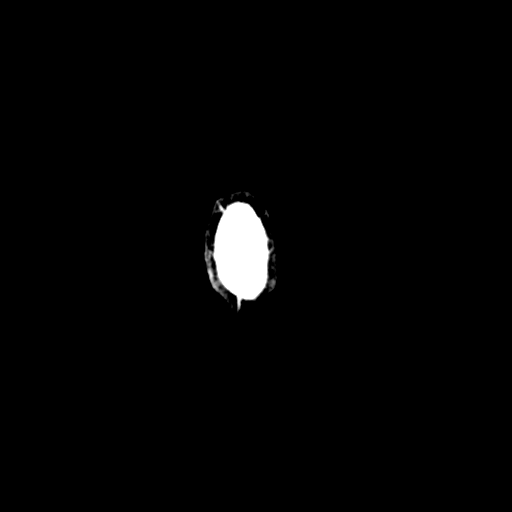

[15 of 30 positions shown; findings below may reference images not displayed]

FINDINGS: Stage irregular peripherally enhancing, centrally hypodense 3.1 x
1.9 cm mass in the LEFT corona radiata, basal ganglia shows
extensive surrounding low-density vasogenic edema, resulting in 4 mm
LEFT-to-RIGHT midline shift, similar. Partially effaced LEFT lateral
ventricle without entrapment. No satellite areas of suspicious
enhancement.

No acute large vascular territory infarcts. No abnormal extra-axial
fluid collections, suspicious extra-axial enhancement or extra-axial
masses. Basal cisterns are patent.

Ocular globes and orbital contents are unremarkable. No destructive
bony lesions. Trace paranasal sinus mucosal thickening without
air-fluid levels. The mastoid air cells are well aerated.
IMPRESSION: Solitary 3.1 x 1.9 cm LEFT corona radiata/ basal ganglia
peripherally enhancing mass, findings are concerning for abscess
(especially given history of Oscar-Weber-Rendu), possible
tumefactive MS, less likely metastatic disease, unlikely to
represent primary brain tumor.

Stable 4 mm LEFT-to-RIGHT midline shift.

## 2017-05-18 ENCOUNTER — Other Ambulatory Visit: Payer: Self-pay | Admitting: Pulmonary Disease

## 2017-06-07 ENCOUNTER — Ambulatory Visit (INDEPENDENT_AMBULATORY_CARE_PROVIDER_SITE_OTHER): Payer: Medicare Other | Admitting: Pulmonary Disease

## 2017-06-07 ENCOUNTER — Other Ambulatory Visit (INDEPENDENT_AMBULATORY_CARE_PROVIDER_SITE_OTHER): Payer: Medicare Other

## 2017-06-07 ENCOUNTER — Encounter: Payer: Self-pay | Admitting: Pulmonary Disease

## 2017-06-07 VITALS — BP 102/60 | HR 54 | Temp 97.0°F | Ht 70.0 in | Wt 184.4 lb

## 2017-06-07 DIAGNOSIS — E7800 Pure hypercholesterolemia, unspecified: Secondary | ICD-10-CM

## 2017-06-07 DIAGNOSIS — J3489 Other specified disorders of nose and nasal sinuses: Secondary | ICD-10-CM | POA: Diagnosis not present

## 2017-06-07 DIAGNOSIS — N138 Other obstructive and reflux uropathy: Secondary | ICD-10-CM

## 2017-06-07 DIAGNOSIS — E559 Vitamin D deficiency, unspecified: Secondary | ICD-10-CM

## 2017-06-07 DIAGNOSIS — G6289 Other specified polyneuropathies: Secondary | ICD-10-CM | POA: Diagnosis not present

## 2017-06-07 DIAGNOSIS — I483 Typical atrial flutter: Secondary | ICD-10-CM | POA: Diagnosis not present

## 2017-06-07 DIAGNOSIS — N401 Enlarged prostate with lower urinary tract symptoms: Secondary | ICD-10-CM | POA: Diagnosis not present

## 2017-06-07 DIAGNOSIS — K219 Gastro-esophageal reflux disease without esophagitis: Secondary | ICD-10-CM

## 2017-06-07 DIAGNOSIS — E039 Hypothyroidism, unspecified: Secondary | ICD-10-CM

## 2017-06-07 DIAGNOSIS — I780 Hereditary hemorrhagic telangiectasia: Secondary | ICD-10-CM

## 2017-06-07 DIAGNOSIS — D126 Benign neoplasm of colon, unspecified: Secondary | ICD-10-CM

## 2017-06-07 DIAGNOSIS — F411 Generalized anxiety disorder: Secondary | ICD-10-CM | POA: Diagnosis not present

## 2017-06-07 DIAGNOSIS — M150 Primary generalized (osteo)arthritis: Secondary | ICD-10-CM | POA: Diagnosis not present

## 2017-06-07 DIAGNOSIS — M159 Polyosteoarthritis, unspecified: Secondary | ICD-10-CM

## 2017-06-07 LAB — COMPREHENSIVE METABOLIC PANEL
ALK PHOS: 50 U/L (ref 39–117)
ALT: 11 U/L (ref 0–53)
AST: 14 U/L (ref 0–37)
Albumin: 4.6 g/dL (ref 3.5–5.2)
BUN: 17 mg/dL (ref 6–23)
CHLORIDE: 102 meq/L (ref 96–112)
CO2: 28 mEq/L (ref 19–32)
Calcium: 9.6 mg/dL (ref 8.4–10.5)
Creatinine, Ser: 1.15 mg/dL (ref 0.40–1.50)
GFR: 65.96 mL/min (ref >60.00)
GLUCOSE: 103 mg/dL — AB (ref 70–99)
POTASSIUM: 4.9 meq/L (ref 3.5–5.1)
Sodium: 136 mEq/L (ref 135–145)
TOTAL PROTEIN: 6.6 g/dL (ref 6.0–8.3)
Total Bilirubin: 0.4 mg/dL (ref 0.2–1.2)

## 2017-06-07 LAB — LIPID PANEL
Cholesterol: 123 mg/dL (ref 0–200)
HDL: 41.5 mg/dL (ref >39.00)
LDL CALC: 72 mg/dL (ref 0–99)
NONHDL: 81.87
Total CHOL/HDL Ratio: 3
Triglycerides: 47 mg/dL (ref 0.0–149.0)
VLDL: 9.4 mg/dL (ref 0.0–40.0)

## 2017-06-07 LAB — CBC WITH DIFFERENTIAL/PLATELET
BASOS ABS: 0.1 10*3/uL (ref 0.0–0.1)
Basophils Relative: 0.9 % (ref 0.0–3.0)
Eosinophils Absolute: 0.1 10*3/uL (ref 0.0–0.7)
Eosinophils Relative: 1.1 % (ref 0.0–5.0)
HCT: 30 % — ABNORMAL LOW (ref 39.0–52.0)
Hemoglobin: 9.4 g/dL — ABNORMAL LOW (ref 13.0–17.0)
LYMPHS ABS: 3.2 10*3/uL (ref 0.7–4.0)
Lymphocytes Relative: 40.7 % (ref 12.0–46.0)
MCHC: 31.2 g/dL (ref 30.0–36.0)
MONOS PCT: 12.6 % — AB (ref 3.0–12.0)
Monocytes Absolute: 1 10*3/uL (ref 0.1–1.0)
NEUTROS ABS: 3.5 10*3/uL (ref 1.4–7.7)
NEUTROS PCT: 44.7 % (ref 43.0–77.0)
Platelets: 379 10*3/uL (ref 150.0–400.0)
RBC: 4.55 Mil/uL (ref 4.22–5.81)
RDW: 19.3 % — ABNORMAL HIGH (ref 11.5–15.5)
WBC: 7.9 10*3/uL (ref 4.0–10.5)

## 2017-06-07 LAB — TSH: TSH: 1.37 u[IU]/mL (ref 0.35–4.50)

## 2017-06-07 LAB — PSA: PSA: 1.82 ng/mL (ref 0.10–4.00)

## 2017-06-07 NOTE — Progress Notes (Addendum)
Subjective:    Patient ID: Gregory [name], male    DOB: 1941/08/03, 76 y.o.   MRN: [id number]  HPI  76 y/o WM here for a follow up visit... he has multiple medical problems including:  Hx OWR syndrome;  Hx atypCP;  Hyperchol;  Hypothyroidism;  GERD/ Divertics/ IBS/ Colon polyps;  BPH/ BOO;  DJD/ LBP/ Plantar fasciitis;  Hx dizziness;  Anxiety & chronic persistant insomnia... 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  LABS 10/14:  FLP- on diet alone looks pretty good w/ LDL=111...  EKG 10/14 showed SBrady, rate57, otherw wnl;  2DEcho 10/14 showed normal LV size & wall thickness, norm LVF w/ EF=55-60%, Gr1DD, mildly thickened AoV leaflets, mild MR;  ETT 11/14 showed 9mn on treadmill, hypertensive BP response, rare PVC, no ischemia, O2 sats dropped to 87% w/ exercise...  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 Given this mild airflow obstruction we will start Rx w/ Dulera100- 2spBid   CXR 3/15 showed normal heart size, clear lungs, mild apical scarring, right basilar density c/w AVM based on CT report 2004, NAD..Marland KitchenMarland Kitchen EKG 3/15 showed NSR, rate87, low volt, NAD...  ADDENDUM> LABS 4/15 showed>  FLP- TChol 137, TG 30, HDL 36, LDL 95;  Chems- wnl;  CBC- anemic w/ Hg=11.3 & MCV=77;  TSH=9.26...   Ret for Iron panel (Fe=16- 4%sat), Ferritin (6.4), Stool cards (pending); then start FeSO4-'325mg'$ BID w/ VitC500... Recheck CBC, Iron level in 6-8weeks...   Taking Synthroid112 regularly, therefore increase back to 129m/d... Recheck TSH in 47m26mohe never returned.  LABS 10/15:  CBC- wnl w/ Hg= 16.5, Fe=88 (23%sat),  Ferritin=36;  TSH=5.10... rec to continue Fe supplement daily...  CXR 4/16 showed norm heart size, clear lungs w/ some hyperinflation, RLL AVM noted w/o change...  EKG 4/16 revealed poor tracing- NSR, rate68, minor NSSTTWA, NAD...   LABS 4/16:  FLP- at goals on diet alone;  Chems- wnl;  CBC- wnl;  TSH=4.38...   ~  June 04, 2015:  44mo34mo & post hosp check>  [name] hosp 5/24 - 05/21/15 by [facility] after presenting w/ HA & right leg weakness; he tells me he saw his dentist for routine cleaning 2wks prior to this; eval revealed a brain lesion in left basal ganglionic region (tumor vs abscess); he had brain stereotactic bx 5/27 by DrNundkumar (left frontal craniectomy) & thick pus was removed- cultures +peptostreptococcus species (otherw neg aerobic bact cult, AFB, Fungi); he was seen by ID & placed on Rochepin, Vanco, Flagyl to be continued 29mo 24mo disch (they are checking levels and adjusting dose); also started on Keppra500Bid per NS;  Since disch he is feeling better- no f/c/s, notes sl dry cough/ no sput, & the cough is keeping him awake- TUSSIONEX called in & helps he says;  Sl nausea relieved by phenergan prn...  EXAM reveals Afeb, VSS, O2sat=94% on RA at rest;  HEENT- OWR telangiectasias w/o change;  Chest- few basilar crackles w/o wheezing, rhonchi, consolidation;  Heart- RR w/o m/r/g;  Abd- neg;  Ext-  neg w/o c/c/e...  CT Head 5/16 showed 2 x 3 cm LEFT basal ganglia mass, likely tumor vs abscess, moderately extensive surrounding edema, small surgical clips within or near the posterior wall LEFT maxillary sinus/LEFT pterygopalatine fossa, in this patient with history of nose bleed (these are stable from prior scans)...  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   EKG 5/16 showed NSR, rate68, wnl, NAD.Marland KitchenMarland Kitchen  Ven Dopplers 6/16 were neg for DVT  LABS- reviewed> only pos cult= peptostreptococcus from brain bx;  Chems- ok x Na & Ca sl low;  CBC- ok x WBC 14=>11... 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 Given IS, refill Phenergan & Tussionex...  ~  July 31, 2015:  23moOV & add-on appt requested for several complaints>  [name] finished his antibiotic Rx for brain abscess (likely due to dental work several weeks prior to onset of neuro symptoms in [id number] DrCampbell has arranged for a follow up CT Brain 08/11/15 (this will be 3 wks off antibiotics);  He had an episode of tachycardia (?AFlutter w/ rvr per EMS) w/ HR incr to 170's- went to ER 7/13 (but was in NSR) & he is currently wearing a Holter monitor per DrCrenshaw w/ follow-up Cards appt sched for next week...        He presents today w/ several complaints>  1) hoarseness: note this is intermittent, off & on, swallowing OK, denies indigestion/ reflux/ etc; he is on Protonix40 w/ his hx of OWR, plus zofran/ librax; we reviewed antireflux regimen & agreed to refer him to ENT for a look at his larynx & their opinion...Marland KitchenMarland Kitchen2) c/o numbness in toes and bottom of his feet bilat for the last week or so w/ some discomfort while walking; no culprit meds on his list & we offered referral to Neuro for a neuropathy evaluation...Marland KitchenMarland Kitchen3) he is c/o weight loss> weight today is 167#, 5'10" Tall, BMI=24 which is wnl; last wt=182# 58m75moo for a 15# wt loss, appetite is good & intake unchanged  he says; we discussed caloric intake and nutritional supplements; we also checked labs> Chems- wnl, BS=103, A1c=5.7;  CBC- wnl; Note- he is on Synthroid125m3m and TSH= 3.28 when checked 07/03/15...   CXR 07/02/15 showed norm heart size, clear lungs, NAD...   CT Head 08/11/15 showed left frontal craniotomy, resolved abscess in left external capsule w/ decr edema and decr enhancement...  2DEcho 08/14/15 showed norm LV size & function w/ EF=55-60%, no regional wall motion abn, Gr2DD, trace MR & TR  ~  October 02, 2015:  58mo 47mo& [name] hMikki Santeegained 4# up to #171# today on Ensure 1can/d:  We discussed the need for AMOXICILLIN 2gm 1H prior to dentist...    He saw ENT (we do not have note) & pt indicates that his cords were sl thinned, they offered injection rx but  his hoarseness is improved & holding off...     He saw DrCampbell, ID on 8/1> f/u anaerobic brain abscess after dental work & presented w/ right sided weakness (resolved); he finished 68moof Levaquin & Flagyl, no f/c/s, neuro exam was WNL, & f/u scan 8/22 w/ resolved abscess; pt tells me DrCampbell signed off...     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 Overall, these findings are severe in degree electrically with respect to sensory responses. 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  We reviewed prob list, meds, xrays and labs> NOTE: THEY WANT ANOTHER CT HEAD IN [id number]..  ~  January 01, 2016:  345moOV & BoMikki Santees stable- persistent neuropathy symptoms (?related to Flagyl Rx?), c/o cold hands, speech difficulty (tongue feels bigger), ?memory problem- offered speech path assessment but he notes Neuro f/u appt in several weeks & wants to wait;  Hoarseness improved w/ MMW;  DrCampbell, ID has signed off- pt & wife want another CT Head to f/u the prev brain abscess & we will order;  He knows to take AMOX prior to any procedures (due to his OWR)... No other interval visits in the last 57m27moe requests mult med refills- ok...  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    Hx Cough, mild COPD, AVM in RLL area w/ some hypoxemia during exercise> prev on Dulera100-2spBid, now off; breathing improved w/ exercise; O2 sat= 91% on RA today; c/o dry throat & encouraged to use lozenges etc...    Hx CP, episode of PAFlutter> w/u by DrCHilary Hertzs neg, no recurrent CP or arrhythmia- he remains active but notes DOE w/ stairs, ok on level ground, 2DEcho & GXT were OK x O2 sat drop to 87% at peak exercise- ?incr shunting thru his RLL AVM...    Chol> Intol to Simva40 & Cres5 due to musc cramps; FLP 4/16 on diet alone showed TChol 146, TG 85, HDL 35, LDL 94...    Hypothy>  On Synthroid 125m68m now>  Labs 7/16 revealed TSH= 3.28 & clinically euthyroid, continue same...    GI- GERD, IBS, Polyps> on Protonix40Qd & Zofran/ Librax prn; [facility]> note reviewed (heme pos stool & anemia from epistaxis & NSAIDs); he had colonoscopy 3/12- neg x for hems & DrM rec f/u 39yrs30yr   GU>  Hx BPH w/ BOO on Rapaflo 8mg/d58mr  DrEskridge; last seen 1/16 & doing satis & pt reports that his PSA was OK...    Ortho> on OTC meds prn; eval 12/11 by DrNorris for bilat leg pain & cramping, ?min atrophy of  left gastroc- he rec tonic water & neurology eval> he saw DrWillis 3/12 (note reviewed)> NCV was normal; EMG c/w mild S1 radiculopathy; he rec MRI- pt decided against the MRI;  He reports some discomfort in knees, hips, back & we decided on trial of Tramadol50...    Anxiety/ Insomnia>  He has Chlorazepate7.5 for Prn use, and Ambien10 to use for sleep Qhs... EXAM reveals Afeb, VSS, O2sat=91% on RA at rest;  Wt is up 8# to 179#; HEENT- OWR telangiectasias w/o change;  Chest- few basilar crackles w/o wheezing, rhonchi, consolidation;  Heart- RR w/o m/r/g;  Abd- neg;  Ext- neg w/o c/c/e...  LABS 1/17>  BMet- wnl  CT Brain 1/17> pending IMP/PLAN>>  [name] has mult somatic complaints lingering & he is still quite anxious; he wants another CT scan & we will order this; he is encouraged to continue his regular f/u appts w/ his specialists & we will recheck in 62month..  ADDENDUM>>  CT Brain1/17/17 w/ contrast showed no acute changes, area of hypoattenuation in left ext capsule & lentiform nucleus extending to the white matter of the centrum semiovale is improved from 07/2015, no abn post contrast enhacement- residual infection is unlikely... He will decide if he wants to repeat the scan in ~679mo.  ~  June 07, 2016:  66m77moV & pulmonary/medical follow up>  BobMikki Santees c/o persistent neuropathy symptoms and right upper lip is numb, otherw feeling well/ energy good/ no f/c/s, no cough/ no sput "except at night" he says;  Notes that he'll wake 1-2 x per week & wakes him up => we discussed reflux & a vigorous antireflux regimen (PPI before dinner, NPO after dinner, elev HOB)... We reviewed the following medical problems during today's office visit >>     OWR>  He has Osler-Weber-Rendu syndrome w/ mult telangiectasias (skin, lips, nasal w/ prev nose  bleeds- mult surg & occluded nares, AVM in RLL on CXR w/o hemoptysis);  Stable- doing satis w/o recurrent bleeding from nose...    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    Hx CP, episode of PAFlutter> w/u by DrCHilary Hertzs neg, no recurrent CP or arrhythmia- he remains active but notes DOE w/ stairs, ok on level ground, 2DEcho & GXT were OK x O2 sat dropped to 87% at peak exercise- ?incr shunting thru his RLL AVM... Marland KitchenMarland Kitchenen by Cards 2/17> OWR, AFlutter, HL; adm w/ brain abscess 5/16 (predisposed by his OWR?); on ToprolXL25, tol well, no recurrent arrhythmia...     Chol> Intol to Simva40 & Cres5 due to musc cramps; FLP 6/17 on diet alone showed TChol 155, TG 60, HDL 42, LDL 101...    Hypothy>  On Synthroid 1266m57m now>  Labs 6/17 revealed TSH= 2.41 & clinically euthyroid, continue same...    GI- GERD, IBS, Polyps> on Protonix40Qd & Zofran/ Librax prn; [facility]> note reviewed (heme pos stool & anemia from epistaxis & NSAIDs); he had colonoscopy 3/17 w/ divertics, hems, no polyps...    GU>  Hx BPH w/ BOO prev on Rapaflo 8mg/39mer DrEskridge; urodynamics showed small capacity hypersens bladder- note 12/2015 reviewed...    Ortho/ Neuro- neuropathy, hx brain abscess> on OTC meds prn; eval 12/11 by DrNorris for bilat leg pain & cramping, ?min atrophy of left gastroc- he rec tonic water &  neurology eval> he saw DrWillis 3/12 (note reviewed)> NCV was normal; EMG c/w mild S1 radiculopathy; he rec MRI- pt decided against the MRI;  He reports some discomfort in knees, hips, back & we decided on trial of Tramadol50... He saw DrPatel 12/2015- hx brain abscess 5/16, followed by neuropathy symptoms, and neuro notes reviewed...     Anxiety/ Insomnia>  He has Chlorazepate7.5 for Prn use, and Ambien10 to use for sleep Qhs... EXAM  reveals Afeb, VSS, O2sat=91% on RA at rest;  Wt is up 8# to 179#; HEENT- OWR telangiectasias w/o change;  Chest- few basilar crackles w/o wheezing, rhonchi, consolidation;  Heart- RR w/o m/r/g;  Abd- neg;  Ext- neg w/o c/c/e...  LABS 06/09/16>  FLP- all parameters at goals on diet alone;  Chems- wnl;  CBC- wnl;  TSH=2.41;  PSA=1.58 IMP/PLAN>>  We reviewed his reflux symptoms and advised a vigorous antireflux regimen w/ Protonix40 before dinner, NPO after dinner, Elev HOB; otherw same meds 7 [name] in 67mosooner prn...  ~  December 07, 2016:  6724moOV & [name] reports doing well- no new complaints or concerns "I feel good"; he does note some right leg numbness but notes that his upper lip is better and his toe neuropathy is better...     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    He has Hx CP & PAFlutter> followed by DrHilary Hertz stable on MetoprololER25/d...    [facility] & note reviewed- OWR, GERD, divertics, colon adenoma; prev on Protonix40 but concern for memory [facility] He had EGD 09/2016 w/ gastric & duodenal AVMs noted...    He had Neuro f/u DrPatel 08/25/16> hx brain abscess (occurred after dental work), bilat feet paresthesias (thought to be due to Flagyl), intol to Gabapentin w/ GI side effects, PT helped; noted recent vision issues...     He had Urology appt w/ [facility]equ[facility]- 07/26/16> prev followed by DrEskridge, c/o freq/ urgency/ nocturia, improved on Myrbetriq50 EXAM reveals Afeb, VSS, O2sat=94% on RA at rest;  Wt is stable at 183#; HEENT- OWR telangiectasias w/o change;  Chest- few basilar crackles w/o wheezing, rhonchi, consolidation;  Heart- RR w/o m/r/g;  Abd- neg;  Ext- neg w/o c/c/e... IMP/PLAN>>  BoMikki Santees stable & doing reasonably well; he says DrMedoff stopped his Protonix because he was on it for yrs and over concern for  dementia & switched him to [employer] We gave him the Prevnar-13 vaccine today & now he is up-to-date...  ~  June 07, 2017:  24m45moV & [name] reports that he's had some signif right shoulder pain- seen by Ortho but we do not have notes from them, states he was given a musc relaxer (no NSAIDs) & getting PT;  He also reports a f/u w/ allergy- DrSharma for "cough" and given Singulair10/ Xyzal5 which has helped... We reviewed the following medical problems during today's office visit >>     S/p bilat cataract surg DrBevis 2018    OWR>  He has Osler-Weber-Rendu syndrome w/ mult telangiectasias (skin, lips, nasal w/ prev nose bleeds- mult surg & occluded nares, AVM in RLL on CXR w/o hemoptysis, & upper GI tract);  Stable overall w/ only intermittent epistaxis, denies GI bleeding, etc... He is again anemic & we'll need to re-evaluate & rx his iron decifiency...    AR>  followed by DrSharma & recently started on Singulair10 + Xyzal5 w/ improvement in symptoms...    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    Hx CP, episode of PAFlutter> w/u by Hilary Hertz was neg, no recurrent CP or arrhythmia- he remains active but notes DOE w/ stairs, ok on level ground, 2DEcho & GXT were OK x O2 sat dropped to 87% at peak exercise- ?incr shunting thru his RLL AVM.Marland KitchenMarland Kitchen Seen by Cards 2/18> OWR, AFlutter, HL; adm w/ brain abscess 5/16 (predisposed by his OWR?); on ToprolXL25, tol well, no recurrent arrhythmia...     Chol> Intol to Simva40 & Cres5 due to musc cramps; FLP 6/18 on diet alone showed TChol 123, TG 47, HDL 42, LDL 72...    Hypothy>  On Synthroid 158mg/d now>  Labs 6/18 revealed TSH= 1.37 & clinically euthyroid, continue same...    GI- GERD, IBS, Polyps> on Protonix40Qd & Zofran/ Librax prn; [facility]> note reviewed (heme pos stool &  anemia from epistaxis & NSAIDs); he had colonoscopy 3/17 w/ divertics, hems, no polyps...    GU>  Hx BPH w/ BOO on Rapaflo 813md per DrEskridge; urodynamics showed small capacity hypersens bladder- note 12/2015 reviewed; now followed by DrCarmie Endseen 2/18 & note reviewed, frequency & nocturia, Myrbetriq helped but too $$, back on Rapaflo8...    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 He saw DrPatel for f/u 12/2016- hx brain abscess 5/16, followed for neuropathy symptoms, and neuro notes reviewed=> placed on Baclofen for musc spasticity...    Anxiety/ Insomnia>  He has Chlorazepate7.5 for Prn use...    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 EXAM reveals Afeb, VSS, O2sat=93% on RA at rest;  Wt is stable ~185#; HEENT- OWR telangiectasias w/o change & nasal obstruction  Chest- few basilar crackles w/o wheezing, rhonchi, consolidation;  Heart- RR w/o m/r/g;  Abd- neg;  Ext- neg w/o c/c/e...  LABS 06/07/17>  FLP- all parameters at goals on diet alone now;  Chems- wnl;  CBC- anemic w/ Hg-9.4, mcv=66;  TSH=1.37;  PSA=1.82...  Pt asked to return for anemia eval w/ repeat CBC  Fe  Ferritin  B12  SPE/IEP - all pending... 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  ADDENDUM>>  Additional labs confirm IDA w/ Hg=9.0, mcv=66, Fe=5 (1.1%sat), B12=817, SPE w/ low TProt/Alb & no M-spike... Rec to start FeSO4 325mg34m w/ VitC  and rec to incr protein intake daily... We plan recheck CMet & CBC/ iron panel in 109month...  ADDENDUM>>  LABS 07/11/17 showed CMet- wnl;  CBC- much improved w/ Hg=13.4, mcv=80;  Iron=137 (37%sat)... REC to decr FeSO4 to one daily w/ VitC & repeat CBC, Iron panel in 71mo (before halloween)... ADDENDUM>>  Labs 10/10/17 showed Hg=16.1 & Fe is good at 113 w/ 34% saturation;  REC to decr the FeSO4 + VitD to 3x per week on MWF & stay on this...           Problem List:   BRAIN ABSCESS  >>  [name] was hosp 5/24 - 05/21/15 by [facility] after presenting w/ HA & right leg weakness; he tells me he saw his dentist for routine cleaning 2wks prior to this; eval revealed a brain lesion in left basal ganglionic region (tumor vs abscess); he had brain stereotactic bx 5/27 by DrNundkumar (left frontal craniectomy) & thick pus was removed- cultures +peptostreptococcus species (otherw neg aerobic bact cult, AFB, Fungi); he was seen by ID & placed on Rochepin, Vanco, Flagyl to be continued 50mo post disch (they are checking levels and adjusting dose); also started on Keppra500Bid per NS... ~  6/16:  Since disch he is feeling better- no f/c/s, notes sl dry cough/ no sput, & the cough is keeping him awake- TUSSIONEX called in & helps he says;  Sl nausea relieved by phenergan prn...  ~  8/16:  He was checked by DrCampbell, ID- they stopped antibiotics and rec f/u CT Brain 08/11/15= 3 wks off the antibiotic rx => resolved abscess. ~  1/17:  CT Brain - pending  OTHER DISEASES OF NASAL CAVITY AND SINUSES - Hx of Osler-Weber-Rendue syndrome/ hereditary telangiectasias... he's had numerous nose bleeds and surgery by Mikael Spray, now followed  by Gayla Medicus... left nares is occluded w/ skin graft- no lumen... this is quite uncomfortable to the patient but he's been told there is nothing further that can be done for this... ~  3/11:  notes occas nosebleeds but he is able to handle them... ~  4/12:  No diff in his pattern of mild nose bleeds & he denies any severe epistaxis episodes... 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 ~  4/14:  He notes infreq bleeding now w/ saline etc... 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 Dyspnea w/ O2 desat w/ exercise >>  ~  12/14:  PFTs showed FVC=3.98 (89%), FEV1=2.72 (79%), %1sec=68, and mid-flows= 51% predicted; c/w mild obstructive dis & we decided on a trial of Dulera100-2spBid ~  4/15:  His breathing is improved w/ the [facility] and exercise program... ~  10/15:  His breathing is back to baseline after FeSO4 rx for anemia & ret of Hg to normal; he has stopped the [facility] & has it handy for prn use... ~  4/16:  prev on  Dulera100-2spBid, now just prn; breathing improved w/ exercise; O2 sat= 93% on RA today; c/o dry throat & encouraged to use lozenges etc... ~  CXR 4/16 showed norm heart size, clear lungs w/ some hyperinflation, RLL AVM noted w/o change... ~  5/16> Hosp w/ right sided weakness, left sided basal ganglionic lesion on CT Head=> eval revealed brain abscess w/ +peptostreptococcus, treated w/ Roceph, Vanco, Flagyl &  followed by ID; rec to use IS & rx cough w/ Tussionex.  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 known GI telangiectasia as well without signif GI bleeding in the past- followed by [facility]... 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 extensive nasal problems as above... ~  labs 2/09 showed Hg= 17.0 ~  labs 2/10 showed Hg= 16.4 ~  CXR 3/11 is chr incr markings esp RLL- no change, NAD...  ~  Labs 6/11 showed Hg= 15.9 ~  Labs 4/12 showed Hg= 16.0 ~  CXR 10/12 showed mild biapical scarring, RLL AVM, NAD... ~  Labs 4/13 showed Hg= 15.2 ~  CXR 8/13 showed heart at upper lim of norm, increased markings & apical scarring, chr incr markings in RLL- AVM, NAD.[name]. 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 ~  CXR 3/15 showed normal heart size, clear lungs, mild apical scarring, right basilar density c/w AVM based on CT report 2004, NAD.[name]. ~  CXR 4/16 showed norm heart size, clear lungs w/ some hyperinflation, RLL AVM noted w/o change... 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  Hx of CHEST PAIN (ICD-786.50) - seen Sep09 w/ several recent bouts of CP while working in yard, washing car, etc... resolved w/ rest & Protonix... EKG showed WNL, prev [facility]StressTest 1/04 at [facility] was neg (no ischemia or infarction and EF= 61%)... Cardiac eval DrCrenshaw w/ 2DEcho 10/09 showing norm LV- no regional wall motion abn, norm LVF w/ EF= 60%, mild dil RA/ RV;  and norm MYOVIEW (no ischemia or infarction, EF= 58%)... ~  10/14: repeat cardiac eval by DrCrenshaw due to  [place] w/ stairs; EKG 10/14 showed SBrady, rate57, otherw wnl;  2DEcho 10/14 showed normal LV size & wall thickness, norm LVF w/ EF=55-60%, Gr1DD, mildly thickened AoV leaflets, mild MR;  ETT 11/14 showed 64mn on treadmill, hypertensive BP response, rare PVC, no ischemia, O2 sats dropped to 87% w/ exercise ~  EKG 3/15 showed NSR, rate87, low volt, NAD..Marland KitchenMarland Kitchen  Episode of AFLUTTER >> this occurred 06/2015 & notes from Cards ([facility] reviewed); placed on lose dose TOPROL-XL & titrated up to '25mg'$ /d... Holter monitor placed and results pending...   HYPERCHOLESTEROLEMIA, MILD (ICD-272.0) - now on CRESTOR '5mg'$ /d>  Prev Rx w/ Simva40 but developed muscle cramping which resolved off the med. ~  FLP 6/08 showed TChol 208, TG 75, HDL 34, LDL 139... he preferred diet Rx- ~  FLP 2/09 showed TChol 172, TG 69, HDL 30, LDL 129... he agreed to CFortine.. ~  FHart[place]8/09 on Crestor10 showed TChol 91, TG 49, HDL 28, LDL 54... rec- keep same. ~  FLP 2/10 on Crestor10 showed TChol 108, TG 56, HDL 31, LDL 66 ~  9/10:  we discussed changing Crestor10 to Simvastatin40 for $$$ reasons... ~  [place] 3/11 on Simva40 showed TChol 105, TG 59, HDL 39, LDL 54... continue same. ~  [place] 4/12 on Simva40 showed TChol 104, TG 62, HDL 29, LDL 63... C/o severe muscle cramps & wants to stop for 65mo cramping resolved! ~  Referred to [facility] & they restarted his Crestor at '5mg'$ /d> tol well so far... ~  FLP 10/12 on Cres5 showed TChol 114, TG 40, HDL 36, LDL 70 ~  FLP 2/13 on Cres5 showed TChol 124, TG 21, HDL 40, LDL 80 ~  FLP 4/14 on Cres5 showed TChol 119, TG 44, HDL 38, LDL 72  ~  6/14: pt stopped Cres5 on his own due to leg cramps & decided to hold statins for now w/ repeat FLP off meds in several months... ~  FLP 10/14 on diet alone showed TChol 155, TG 29, HDL 38, LDL 111  ~  FLP 4/15 on diet alone showed TChol 137, TG 30, HDL 36, LDL 95 ~  FLP 4/16 on diet alone showed TChol 146, TG 85, HDL 35, LDL  94  HYPOTHYROIDISM - on SYNTHROID 1172m/d now; prev 12578md dose was decreased 4/12... ~  labs 2/09 showed TSH= 0.69...  8/09 showed TSH= 0.44... ~  labs 2/10 showed TSH= 0.68 ~  labs 3/11 showed TSH= 0.52 ~  Labs 4/12 on Synthroid125 showed TSH= 0.33... We decided to decr the dose to 112/d. ~  Labs 4/13 on Levo112 showed TSH= 4.60 ~  Labs 4/14 on Levo112 showed TSH= 5.46 ~  Labs 4/15 on Levo112 showed TSH= 9.26 and we decided to incr dose back to 125m70m... ~  Labs 4/16 on Levo125 showed TSH= 4.38  GERD, IRRITABLE BOWEL SYNDROME, COLONIC POLYPS - [facility] (he tried OTC Zantac '75mg'$  on his own)... also takes  LIBRSouth [place] ~  colonoscopy 1/04 w/ divertics, diminutive rectal polyp (adenomatous), hems...  ~  f/u colon 1/07 showed no recurrent polyps... ~  [facility] HYPERTROPHY PROSTATE W/UR OBST & OTH LUTS (ICD-600.01) - followed by DrRDaONGEXBMWAPAFLO '8mg'$ /d (stopped Flomax due to side effect- ED)... doing well without LTOS, just complains of nocturia x 1-2 ~  labs 2/09 showed PSA= 0.76 ~  labs 2/10 showed PSA= 0.90 ~  2011 PSA checked by DrDavis (pt states it was OK)... ~  Labs 4/12 showed PSA= 1.22 ~  Labs 4/13 showed PSA= 1.31 ~  10/13: presents c/o incr freq, nocturia x2-4, & weak stream despite Rapaflo; he is intol to Flomax; refer to Urology for further eval=> seen by DrEskridge, no changes made... ~  1/15: f/u by Urology, DrEskridge> BPH, LUTS, hypersens unstable bladder; on Rapaflo8 & doing satis, PSA reported to be 2.34...  LEFT ARM & SHOULDER PAIN >> he was checked by GboroLacie Scottsnot improving he says; [facility]> [facility] reviewed.  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 ~  9/10:  requesting Rx for Robaxin for muscle spasm as needed.  PLANTAR FASCIITIS (ICD-728.71) -  c/o pain in his feet secondary to plantar fasciitis eval & [facility] w/ Celebrex & shots... ~  9/10: [facility].  Hx NOCTURNAL LEG  CRAMPS >>  ~  This initially occurred several yrs ago while on Simvastatin & the cramps resolved off this med; he was subseq placed on Cres5 & tol well... ~  6/14: he indicates that noct leg cramps returned x1wk & he stopped the Cres5 w/ improvement; he wants to leave off the statins for now; rec to try tonic water/ yellow mustard prn...  Hx of DIZZINESS (ICD-780.4) - sudden episode dizziness Sep09 working at [employer] around [employer] w/ room spinning- lasted 47mn and resolved spontaneously, felt light headed afterwards... no LOC, syncope, seizure activity, etc... no CP/ palpit/ SOB/ etc... there were several EMTs in the restaurant and they checked him out- stable and transported to ER... 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  ANEMIA >>  ~  LABS 4/13 showed Hg= 15.2, MCV=91 ~  LABS 4/14 showed Hg= 14.5, MCV=84 ~  LABS 3/15 showed Hg= 13.6, MCV=78 ~  LABS 4/15 showed Hg= 11.3, MCV= 77, Iron studies showed Fe= 16 (3.6%sat), Ferritin= 6.4, & stool cards neg; Rx w/ FeSO4... ~  Labs 10/15 showed Hg= 16.5, MCV= 91, Fe=88 (23%sat), Ferritin= 36...  ~  Labs 4/16 showed Hg= 16.0  Health Maintenance: ~  [facility]  [facility] etc... (last 1/07 & 516yr/u due 1/12). ~  GU:  followed by DrRDavis in past; PSAs have all been wnl... ~  Immunizations:  he gets yearly Flu  vaccinations in the fall;  OK Pneumovax 03/12/10 at age- [age]    Past Surgical History:  Procedure Laterality Date  . APPLICATION OF CRANIAL NAVIGATION N/A 05/16/2015   Procedure: APPLICATION OF CRANIAL NAVIGATION;  Surgeon: NeConsuella LoseMD;  [place];  Service: Neurosurgery;  Laterality: N/A;  . BRAIN BIOPSY Left 05/16/2015   Procedure: Stereotactic Left Brain Biopsy with Brain Lab;  Surgeon: NeConsuella LoseMD;  [place] [facility];  Service: Neurosurgery;  Laterality: Left;  Stereotactic Left Brain biopsy with brainlab  . CARDIOVASCULAR STRESS TEST  01-14-2003   NO ISCHEMIA / EF 61%/ NORMAL LE WALL MOTION  . EXCISION LEFT WRIST GANGLIAN/ MYXOID CYST  05-30-2009  . 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 OTHER SURGICAL HISTORY     pulse laser for facial telangiectasias inthe past  . removal of skin cancer from forehead  10/2012   Dr. HaRenda Rolls. TRANSTHORACIC ECHOCARDIOGRAM  10-17-2008   DR CRENSHAW   NORMAL LVF/ EF 60%/  MILDLY DILATED RIGHT ATRIUM/ VENTRICULE  . VARICOCELECTOMY  1991   Dr. RoTresa Endo  Outpatient Encounter Prescriptions as of 06/07/2017  Medication Sig  . acetaminophen (TYLENOL) 325 MG tablet Take 650 mg by mouth daily as needed (pain).  . Marland Kitchenmoxicillin (AMOXIL) 500 MG capsule TAKE 4 CAPSULES 1 HOUR PRIOR TO PROCEDURE  . Cholecalciferol (VITAMIN D3) 2000 UNITS TABS Take 2,000 Units by mouth daily.  . clidinium-chlordiazePOXIDE (LIBRAX) 5-2.5 MG capsule Take 1 capsule by mouth 3 (three) times daily as needed.  . clorazepate (  TRANXENE) 7.5 MG tablet TAKE 1 TABLET BY MOUTH 3 TIMES A DAY AS NEEDED  . Diphenhyd-Hydrocort-Nystatin (FIRST-DUKES MOUTHWASH) SUSP 1 tsp gargle and swallow four times daily  . levocetirizine (XYZAL) 5 MG tablet Take 5 mg by mouth every evening.  [name] levothyroxine (SYNTHROID, LEVOTHROID) 125 MCG tablet TAKE 1 TABLET BY MOUTH  DAILY BEFORE BREAKFAST  . loratadine (CLARITIN) 10  MG tablet Take 10 mg by mouth daily.  . Magnesium 250 MG TABS Take 250 mg by mouth at bedtime.   . meclizine (ANTIVERT) 25 MG tablet Take 1 tablet (25 mg total) by mouth daily as needed for dizziness.  . methocarbamol (ROBAXIN) 500 MG tablet Take 500 mg by mouth 2 (two) times daily as needed for muscle spasms.  . metoprolol succinate (TOPROL-XL) 25 MG 24 hr tablet Take 1 tablet (25 mg total) by mouth daily.  . montelukast (SINGULAIR) 10 MG tablet Take 10 mg by mouth at bedtime.  . Multiple Vitamin (MULTIVITAMIN WITH MINERALS) TABS tablet Take 1 tablet by mouth daily. Centrum  . pantoprazole (PROTONIX) 40 MG tablet Take 1 tablet (40 mg total) by mouth daily.  . silodosin (RAPAFLO) 8 MG CAPS capsule Take 1 capsule (8 mg total) by mouth at bedtime.   No facility-administered encounter medications on file as of 06/07/2017.     Allergies  Allergen Reactions  . Nsaids Other (See Comments)    Cannot take-per MD  . Other Other (See Comments)    All blood thinners-cannot take per MD  . Aspirin Other (See Comments)    nosebleeds  . Statins Other (See Comments)    Weakness, myalgias    Immunization History  Administered Date(s) Administered  . Influenza Split 09/21/2011, 09/27/2012  . Influenza Whole 09/11/2008, 09/11/2009, 09/22/2010  . Influenza, High Dose Seasonal PF 09/09/2016  . Influenza,inj,Quad PF,36+ Mos 10/04/2013, 10/08/2014, 10/02/2015  . Pneumococcal Conjugate-13 12/07/2016  . Pneumococcal Polysaccharide-23 03/12/2010  . Tdap 10/04/2013  . Varicella 12/20/2009     Current Medications, Allergies, Past Medical History, Past Surgical History, Family History, and Social History were reviewed in [employer] record.    Review of Systems       See HPI - other systems neg except as noted... The patient denies anorexia, fever, weight loss, weight gain, vision loss, decreased hearing, hoarseness, chest pain, syncope, dyspnea on exertion, peripheral edema,  prolonged cough, headaches, hemoptysis, abdominal pain, melena, hematochezia, severe indigestion/heartburn, hematuria, incontinence, muscle weakness, suspicious skin lesions, transient blindness, difficulty walking, depression, unusual weight change, abnormal bleeding, enlarged lymph nodes, and angioedema.     Objective:   Physical Exam      WD, WN, 76 y/o WM in NAD... 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 GENERAL:  Alert & oriented; pleasant & cooperative... HEENT:  [place]/AT, EOM-wnl, PERRLA, EACs-clear, TMs-wnl, NOSE- occluded left nares from skin grafts, MOUTH- mucosal telangiectasias.  NECK:  Supple w/ fairROM; no JVD; normal carotid impulses w/o bruits; no thyromegaly or nodules palpated; no lymphadenopathy. CHEST:  Clear to P & A; without wheezes/ rales/ or rhonchi. HEART:  Regular Rhythm; without murmurs/ rubs/ or gallops. ABDOMEN:  Soft & nontender; normal bowel sounds; no organomegaly or masses detected. EXT: without deformities, mild arthritic changes; no varicose veins/ venous insuffic/ or edema. NEURO:  CN's intact; motor testing normal; sensory testing normal; gait normal & balance OK. DERM:  mult telangiectasias...  RADIOLOGY DATA:  Reviewed in the EPIC EMR & discussed w/ the patient...  LABORATORY DATA:  Reviewed in the EPIC EMR &  discussed w/ the patient...   Assessment & Plan:    BRAIN ABSCESS >> [facility] 5/16, followed by ID, DrCampbell, finished antibiotic rx 07/2015 and f/u CT Brain=> resolved abscess... He will need antibiotic prophylaxis prior to future dental work...  Hx Cough, Dyspnea on exertion, mild COPD >> as above, prev on Dulera100- 2sp Bid & exercise program- improved; we will continue to monitor his status & O2 sats; Dyspnea improved w/ Fe for anemia & ret of Hg to norm... He has Tussionex prn cough... 8/16> c/o intermittent hoarseness & referred to ENT for eval of cords => we don't have note but pt indicates sl thinning of cords and they offered  injections but he's holding off...  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     Hx CP, AFlutter>  Prev cardiac w/u by DrCrenshaw was neg, no recurrent CP complaints, but he notes DOE w/ stairs & DrCrenshaw repeated 2DEcho & ETT as above... Episode of PAFlutter 06/2015=> holter monitor per cards- pending & pt improved on ToprolXL25...      Chol>  Off statins due to recurrent cramps, on diet alone & FLP is reasonable...     Hypothy>  On Synthroid 186mg/d now>  He is clinically & biochem euthyroid...     [facility]>  [facility]- neg x for hems & DrM rec f/u 564yr..     GU>  Hx BPH w/ BOO & followed by DrEskridge on Rapaflo '8mg'$ /d & stable...     Ortho>  See above- prev stable on OTC analgesics as needed, but recent incr symptoms- try Tramadol50 prn...  LEG CRAMPS >> he is quite sure they are from his statin Rx & we decided to leave these off for now, try to control on diet/ exercise & continued monitoring... 8/16> c/o numbness in toes & bottom of feet 8/16>  Refer to Neuro for neuropathy eval...     Anxiety/ Insomnia>  He has Chlorazepate for Prn use, and Ambien to use for sleep Qhs...  Anemia>  Prev on FeSO4 supplement & Hg now back to normal, dyspnea ret to baseline 7 he stopped the Fe... 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   Patient's Medications  New Prescriptions   No medications on file  Previous Medications   ACETAMINOPHEN (TYLENOL) 325 MG TABLET    Take 650 mg by mouth daily as needed (pain).   AMOXICILLIN (AMOXIL) 500 MG CAPSULE    TAKE 4 CAPSULES 1 HOUR PRIOR TO PROCEDURE   CHOLECALCIFEROL (VITAMIN D3) 2000 UNITS TABS    Take 2,000 Units by mouth daily.   CLIDINIUM-CHLORDIAZEPOXIDE (LIBRAX) 5-2.5 MG CAPSULE    Take 1 capsule by mouth 3 (three)  times daily as needed.   CLORAZEPATE (TRANXENE) 7.5 MG TABLET    TAKE 1 TABLET BY MOUTH 3 TIMES A DAY AS NEEDED   DIPHENHYD-HYDROCORT-NYSTATIN (FIRST-DUKES MOUTHWASH) SUSP    1 tsp gargle and swallow four times daily   LEVOCETIRIZINE (XYZAL) 5 MG TABLET    Take 5 mg by mouth every evening.   LEVOTHYROXINE (SYNTHROID, LEVOTHROID) 125 MCG TABLET    TAKE 1 TABLET BY MOUTH  DAILY BEFORE BREAKFAST   LORATADINE (CLARITIN) 10 MG TABLET    Take 10 mg by mouth daily.   MAGNESIUM 250 MG TABS    Take 250 mg by mouth at bedtime.    MECLIZINE (ANTIVERT) 25 MG TABLET    Take 1 tablet (25 mg total) by mouth daily as needed for dizziness.   METHOCARBAMOL (ROBAXIN) 500 MG TABLET    Take 500 mg by mouth 2 (two) times daily as needed for muscle spasms.   METOPROLOL SUCCINATE (TOPROL-XL) 25 MG 24 HR TABLET    Take 1 tablet (25 mg total) by mouth daily.   MONTELUKAST (SINGULAIR) 10 MG TABLET    Take 10 mg by mouth at bedtime.   MULTIPLE VITAMIN (MULTIVITAMIN WITH MINERALS) TABS TABLET    Take 1 tablet by mouth daily. Centrum   PANTOPRAZOLE (PROTONIX) 40 MG TABLET    Take 1 tablet (40 mg total) by mouth daily.   SILODOSIN (RAPAFLO) 8 MG CAPS CAPSULE    Take 1 capsule (8 mg total) by mouth at bedtime.  Modified Medications   No medications on file  Discontinued Medications   No medications on file

## 2017-06-07 NOTE — Patient Instructions (Signed)
Today we updated your med list in our EPIC system...    Continue your current medications the same...  Today we did your fasting blood work...    We will contact you w/ the results when available...   Stay as active as possible...  Call for any questions...  Let's plan a follow up visit in 66mo, sooner if needed for problems.Marland KitchenMarland Kitchen

## 2017-06-10 ENCOUNTER — Telehealth: Payer: Self-pay | Admitting: Pulmonary Disease

## 2017-06-10 ENCOUNTER — Other Ambulatory Visit (INDEPENDENT_AMBULATORY_CARE_PROVIDER_SITE_OTHER): Payer: Medicare Other

## 2017-06-10 DIAGNOSIS — D649 Anemia, unspecified: Secondary | ICD-10-CM | POA: Diagnosis not present

## 2017-06-10 DIAGNOSIS — E538 Deficiency of other specified B group vitamins: Secondary | ICD-10-CM | POA: Diagnosis not present

## 2017-06-10 LAB — IBC PANEL
IRON: 5 ug/dL — AB (ref 42–165)
SATURATION RATIOS: 1.1 % — AB (ref 20.0–50.0)
Transferrin: 336 mg/dL (ref 212.0–360.0)

## 2017-06-10 LAB — VITAMIN D 1,25 DIHYDROXY
Vitamin D 1, 25 (OH)2 Total: 31 pg/mL (ref 18–72)
Vitamin D2 1, 25 (OH)2: <8 pg/mL
Vitamin D3 1, 25 (OH)2: 31 pg/mL

## 2017-06-10 LAB — CBC WITH DIFFERENTIAL/PLATELET
BASOS ABS: 0.1 10*3/uL (ref 0.0–0.1)
Basophils Relative: 0.8 % (ref 0.0–3.0)
EOS ABS: 0.1 10*3/uL (ref 0.0–0.7)
Eosinophils Relative: 0.6 % (ref 0.0–5.0)
HEMATOCRIT: 28.6 % — AB (ref 39.0–52.0)
Hemoglobin: 9 g/dL — ABNORMAL LOW (ref 13.0–17.0)
LYMPHS ABS: 3.1 10*3/uL (ref 0.7–4.0)
LYMPHS PCT: 38.1 % (ref 12.0–46.0)
MCHC: 31.5 g/dL (ref 30.0–36.0)
MCV: 66.6 fl — ABNORMAL LOW (ref 78.0–100.0)
MONO ABS: 0.7 10*3/uL (ref 0.1–1.0)
Monocytes Relative: 8.4 % (ref 3.0–12.0)
NEUTROS ABS: 4.3 10*3/uL (ref 1.4–7.7)
NEUTROS PCT: 52.1 % (ref 43.0–77.0)
PLATELETS: 375 10*3/uL (ref 150.0–400.0)
RBC: 4.31 Mil/uL (ref 4.22–5.81)
RDW: 18.9 % — ABNORMAL HIGH (ref 11.5–15.5)
WBC: 8.2 10*3/uL (ref 4.0–10.5)

## 2017-06-10 LAB — VITAMIN B12: Vitamin B-12: 817 pg/mL (ref 211–911)

## 2017-06-10 NOTE — Telephone Encounter (Signed)
Will send message to SN---looks like he did call the pt for lab results.

## 2017-06-10 NOTE — Telephone Encounter (Signed)
SN wanted lab order placed for Monday and these have been done.  SN stated that he will call and speak with the pt.

## 2017-06-14 LAB — PROTEIN ELECTROPHORESIS, SERUM, WITH REFLEX
ALPHA-2-GLOBULIN: 0.4 g/dL — AB (ref 0.5–0.9)
Albumin ELP: 2.7 g/dL — ABNORMAL LOW (ref 3.8–4.8)
Alpha-1-Globulin: 0.2 g/dL (ref 0.2–0.3)
BETA GLOBULIN: 0.3 g/dL — AB (ref 0.4–0.6)
Beta 2: 0.1 g/dL — ABNORMAL LOW (ref 0.2–0.5)
GAMMA GLOBULIN: 0.6 g/dL — AB (ref 0.8–1.7)
TOTAL PROTEIN, SERUM ELECTROPHOR: 4.4 g/dL — AB (ref 6.1–8.1)

## 2017-06-15 ENCOUNTER — Telehealth: Payer: Self-pay | Admitting: Pulmonary Disease

## 2017-06-15 DIAGNOSIS — D649 Anemia, unspecified: Secondary | ICD-10-CM

## 2017-06-15 DIAGNOSIS — E46 Unspecified protein-calorie malnutrition: Secondary | ICD-10-CM

## 2017-06-15 NOTE — Telephone Encounter (Signed)
      Please notify patient>   LABS confirm very low serum iron level & anemia w/ Hg=9; B12 is wnl, but SPE shows LOW SERUM Protein (TProt & Alb)...  REC>> we decided on oral iron supplementation & rec to take FeSO4 325mg  TWICE daily w/ a 500mg  VitC tab to aid absorption... He also needs to supplement protein intake via diet & supplements like hi prot yogurt daily &/or a prot supplement powder from [facility]... Please enter f/u labs in 1 month-- CMet, CBC, Iron panel & tell pt to mark his calendar...    Results have been explained to patient, pt expressed understanding. Aware of rec's per SN. Labs ordered. Pt aware to come in at end of July for labs. Nothing further needed.

## 2017-06-23 ENCOUNTER — Ambulatory Visit (INDEPENDENT_AMBULATORY_CARE_PROVIDER_SITE_OTHER): Payer: Medicare Other | Admitting: Neurology

## 2017-06-23 ENCOUNTER — Encounter: Payer: Self-pay | Admitting: Neurology

## 2017-06-23 VITALS — BP 124/58 | HR 54 | Ht 70.0 in | Wt 186.0 lb

## 2017-06-23 DIAGNOSIS — G620 Drug-induced polyneuropathy: Secondary | ICD-10-CM

## 2017-06-23 DIAGNOSIS — R252 Cramp and spasm: Secondary | ICD-10-CM

## 2017-06-23 DIAGNOSIS — G060 Intracranial abscess and granuloma: Secondary | ICD-10-CM | POA: Diagnosis not present

## 2017-06-23 NOTE — Patient Instructions (Addendum)
Continue Robaxin 500mg  at bedtime  Return to clinic in 1 year

## 2017-06-23 NOTE — Progress Notes (Signed)
Follow-up Visit   Date: 06/23/17   Gregory [name] MRN: [id number] DOB: 09-Aug-1941   Interim History: Gregory [name] is a 76 y.o. right-handed Caucasian male with hypothyroidism, hyperlipidemia, Osler-Weber- Rendu disease, GERD, and left external basal ganglia polymicrobial abscess (2016) returning to the clinic for follow-up of right sided spasticity and neuropathy.   The patient was accompanied to the clinic by self.  History of present illness: He was hospitalized from May 24 - May 21 2015 with right sided leg weakness and headaches. Patient was subsequently found to have a brain mass 3 x 2 cm involving the basal ganglia with midline shift and edema seen on CT head. 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 IV antibiotics including vancomycin, rocephin, and flagyl was started. He was also started on keppra 500mg  BID. Of note, he had routine dental work 2 weeks prior to symptom onset.  He has completed his course of antibiotics therapy with IV for 6 weeks and transitioned to oral levofloxacin and metronidazole for 4 weeks. He has been off antibiotics since early August. He has been doing well and complains of mild right leg weakness. He is scheduled for repeat CT brain in mid-August.   Starting ~ late July 2016, he started noticing numbness/tingling of the toes and shooting pain involving the sole of the foot. Symptoms are constant and involve both feet equally. Shooting pain is worse with weight bearing. Nothing alleviates the pain. He endorses mild low back pain.  UPDATE 09/11/2015:  He no longer has tingling sensation of leg, but continues to have persistent numbness of the feet. Gabapentin was stopped because of GI side effects.  Physical therapy has significantly improved his leg strength, which he feels is back to baseline.  He has developed hoarseness and has seen ENT, who noted thin cords.  His hand writing is slowly  improving, but still not completely improved. Interval CT head from August shows resolving abscess of the left external capsule with reduced edema.   UPDATE 01/12/2016:   He complains of intermittent slurred speech, occuring several times per week.  There is no pattern that he had identified with his speech.  No word-finding difficulty.  Over the holidays, he was not compliant with his home exercises and restarted this and noticed that his right leg seems slightly weaker than before.  No recent falls.  He continues to have persistent numbness of the feet.  He also complains of cold hands.  Denies double vision, dysphagia.    UPDATE 08/25/2016:  He went for annual eye exam and was alarmed that he was unable to read the letters on the Snellen chart, which was surprising because he did not notice and vision changes. He saw his ophthalmologist who is planning on doing a formal visual field test next week.  No headaches, double vision, pain with eye movement.  He states that he has had precancerous lesions removed from his left eyebrow.  His numbness and tingling of the feet has nearly resolved.  He denies peripheral vision changes or vision loss involving the right eye.   UPDATE 01/13/2017:  He was found to have a new cataract involving his left eye and is seeing an ophthalmologist for this on February 20th.  He denies any eye pain and double vision.  No similar symptoms involving his right eye. He feels that his right leg weakness has worsened over the past 2 months.  His leg feels heavier and  appears to be dragging his leg.  He denies any falls and walks unassisted.  He continues to have numbness and tingling of the toes, and feels this has improved some.  He also complains of handwriting changes, specifically writing has become much smaller and illegible at times.  UPDATE 06/23/2017:  He is here for 6 month appointment.  He developed right shoulder and neck pain and was started on robaxin for muscle stiffness and  noticed that this has helped with his right arm tightness.  He also completed 4 weeks for neck PT which helped. Overall, he feels well and has not noticed any new neurological symptoms. He continues to have numbness of the toes but sensation is slightly better than before.  He is being treated for iron deficiency anemia which is new.   Medications:  Current Outpatient Prescriptions on File Prior to Visit  Medication Sig Dispense Refill  . acetaminophen (TYLENOL) 325 MG tablet Take 650 mg by mouth daily as needed (pain).    [name] amoxicillin (AMOXIL) 500 MG capsule TAKE 4 CAPSULES 1 HOUR PRIOR TO PROCEDURE 4 capsule 4  . Cholecalciferol (VITAMIN D3) 2000 UNITS TABS Take 2,000 Units by mouth daily.    . clidinium-chlordiazePOXIDE (LIBRAX) 5-2.5 MG capsule Take 1 capsule by mouth 3 (three) times daily as needed. 60 capsule 5  . clorazepate (TRANXENE) 7.5 MG tablet TAKE 1 TABLET BY MOUTH 3 TIMES A DAY AS NEEDED 90 tablet 5  . Diphenhyd-Hydrocort-Nystatin (FIRST-DUKES MOUTHWASH) SUSP 1 tsp gargle and swallow four times daily 120 mL 5  . levocetirizine (XYZAL) 5 MG tablet Take 5 mg by mouth every evening.    [name] levothyroxine (SYNTHROID, LEVOTHROID) 125 MCG tablet TAKE 1 TABLET BY MOUTH  DAILY BEFORE BREAKFAST 90 tablet 3  . Magnesium 250 MG TABS Take 250 mg by mouth at bedtime.     . meclizine (ANTIVERT) 25 MG tablet Take 1 tablet (25 mg total) by mouth daily as needed for dizziness. 30 tablet 2  . methocarbamol (ROBAXIN) 500 MG tablet Take 500 mg by mouth 2 (two) times daily as needed for muscle spasms.    . metoprolol succinate (TOPROL-XL) 25 MG 24 hr tablet Take 1 tablet (25 mg total) by mouth daily. 90 tablet 3  . montelukast (SINGULAIR) 10 MG tablet Take 10 mg by mouth at bedtime.    . Multiple Vitamin (MULTIVITAMIN WITH MINERALS) TABS tablet Take 1 tablet by mouth daily. Centrum    . pantoprazole (PROTONIX) 40 MG tablet Take 1 tablet (40 mg total) by mouth daily. 90 tablet 3  . silodosin (RAPAFLO) 8  MG CAPS capsule Take 1 capsule (8 mg total) by mouth at bedtime. 90 capsule 3   No current facility-administered medications on file prior to visit.     Allergies:  Allergies  Allergen Reactions  . Nsaids Other (See Comments)    Cannot take-per MD  . Other Other (See Comments)    All blood thinners-cannot take per MD  . Aspirin Other (See Comments)    nosebleeds  . Statins Other (See Comments)    Weakness, myalgias    Review of Systems:  CONSTITUTIONAL: No fevers, chills, night sweats, or weight loss.  EYES: +visual changes or eye pain ENT: No hearing changes.  No history of nose bleeds.   RESPIRATORY: No cough, wheezing and shortness of breath.   CARDIOVASCULAR: Negative for chest pain, and palpitations.   GI: Negative for abdominal discomfort, blood in stools or black stools.  No recent change in bowel  habits.   GU:  No history of incontinence.   MUSCLOSKELETAL: No history of joint pain or swelling.  No myalgias.   SKIN: Negative for lesions, rash, and itching.   ENDOCRINE: Negative for cold or heat intolerance, polydipsia or goiter.   PSYCH:  No depression or anxiety symptoms.   NEURO: As Above.   Vital Signs:  BP (!) 124/58   Pulse (!) 54   Ht 5\' 10"  (1.778 m)   Wt 186 lb (84.4 kg)   SpO2 96%   BMI 26.69 kg/m    Neurological Exam: MENTAL STATUS including orientation to time, place, person, recent and remote memory, attention span and concentration, language, and fund of knowledge is normal.  Speech is not dysarthric.  CRANIAL NERVES:  Normal fundoscopic exam.  Pupils equal round and reactive to light.  Normal conjugate, extra-ocular eye movements in all directions of gaze.  Right ptosis (old).  Face is symmetric and muscles are intact. Palate elevates symmetrically.  Tongue is midline.  MOTOR:  Motor strength is 5/5 in all extremities.   No pronator drift.  There is evidence of spasticity on right side. RUE trace, RLE 1, LUE 0, LLE 0  MSRs:  Reflexes are 3+/4  throughout, brisker on the right side.  SENSORY:  Vibration is reduced at the ankles bilaterally (75%), 25% at the great toe, intact at the knees.    COORDINATION/GAIT:  Normal finger-to- nose-finger. Intact rapid alternating movements bilaterally, subtle reduced amplitude of finger tapping on right.  Gait narrow based and stable.   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  Stable 4 mm LEFT-to-RIGHT midline shift.  CT head 06/24/2015:  Significant interval improvement when compared to the 05/13/2015 examination.  The ring-enhancing lesion which was centered in the left basal ganglia has decreased significantly in size. Currently, 2.3 x 0.9 cm region of relatively solid enhancement superior aspect of the left basal ganglia remains as versus prior 3.1 x 1.9 cm ring-enhancing lesion. Significant decrease in surrounding vasogenic edema and marked decrease mass effect upon the left lateral ventricle. What remains may represent residua of treated abscess. This will need to be followup until complete clearance. As this clears it would be helpful to exclude the possibility of underlying vascular abnormality in this patient with Osler-Weber-Rendu.  CT head 08/11/2015: Resolving abscess left external capsule. There is decreased edema and decreased enhancement in this area. No residual fluid collection. No other areas of acute abnormality.  CT head wwo contrast 01/06/2016:  Hypoattenuation in the LEFT basal ganglia and regional white matter is improved from August but not yet normalized. No abnormal postcontrast enhancement to definitively suggest residual infection. Continued surveillance may be warranted, given the patient's residual RIGHT leg weakness and  intermittent slurred speech.  NCS/EMG of the legs 09/16/2015: 1. The electrophysiologic findings are most consistent with a generalized sensorimotor polyneuropathy, axon loss in type, affecting the lower extremities. Overall, these findings are severe in degree electrically with respect to sensory responses. 2. 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  Labs 09/11/2015:  vitamin B12 874, folate 24.8, vitamin B1 36, copper 77, vitamin B6 73.8, myasthenia panel negative  CT head 01/21/2017:  No change from the prior CT. Negative for abscess or mass lesion. Chronic encephalomalacia left external capsule related  to prior cerebral abscess.  IMPRESSION/PLAN: 1. Right arm and leg spasticity from his previously treated left basal ganglia polymicrobial abscess (May 2016).  Interval CT brain showed chronic encephalomalacia of the left external capsule, no new findings.  At his last visit, there was increased tone in the right hand and baclofen was started; however, this was stopped due to increased sedation with 5mg  at bedtime. He is doing well on Robaxin 500mg  at bedtime which will be continued.  I have encouraged him to continue home stretching exercises.  2.  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  Return to clinic in 1 year   The duration of this appointment visit was 20 minutes of face-to-face time with the patient.  Greater than 50% of this time was spent in counseling, explanation of diagnosis, planning of further management, and coordination of care.   Thank you for allowing me to participate in patient's care.  If I can answer any additional questions, I would be pleased to do so.    Sincerely,    Lizzette Carbonell K. Posey Pronto, DO

## 2017-06-28 ENCOUNTER — Other Ambulatory Visit: Payer: Self-pay | Admitting: Pulmonary Disease

## 2017-06-28 MED ORDER — FIRST-DUKES MOUTHWASH MT SUSP: OROMUCOSAL | 5 refills | Status: DC

## 2017-06-30 ENCOUNTER — Telehealth: Payer: Self-pay | Admitting: Pulmonary Disease

## 2017-06-30 NOTE — Telephone Encounter (Signed)
Called and spoke with pts pharmacy and he stated that the [facility] MMW is not covered by medicare and is pricey without insurance.  He stated that they can make up the MMW and it would be $14 without the insurance.  They will get this ready for the pt and notify the pt once this is ready.

## 2017-07-11 ENCOUNTER — Other Ambulatory Visit (INDEPENDENT_AMBULATORY_CARE_PROVIDER_SITE_OTHER): Payer: Medicare Other

## 2017-07-11 DIAGNOSIS — E46 Unspecified protein-calorie malnutrition: Secondary | ICD-10-CM | POA: Diagnosis not present

## 2017-07-11 DIAGNOSIS — D649 Anemia, unspecified: Secondary | ICD-10-CM | POA: Diagnosis not present

## 2017-07-11 LAB — COMPREHENSIVE METABOLIC PANEL
ALT: 11 U/L (ref 0–53)
AST: 12 U/L (ref 0–37)
Albumin: 4.2 g/dL (ref 3.5–5.2)
Alkaline Phosphatase: 49 U/L (ref 39–117)
BUN: 13 mg/dL (ref 6–23)
CHLORIDE: 101 meq/L (ref 96–112)
CO2: 27 meq/L (ref 19–32)
Calcium: 9.4 mg/dL (ref 8.4–10.5)
Creatinine, Ser: 1.13 mg/dL (ref 0.40–1.50)
GFR: 67.29 mL/min (ref >60.00)
GLUCOSE: 97 mg/dL (ref 70–99)
POTASSIUM: 4.2 meq/L (ref 3.5–5.1)
SODIUM: 135 meq/L (ref 135–145)
Total Bilirubin: 0.4 mg/dL (ref 0.2–1.2)
Total Protein: 6.6 g/dL (ref 6.0–8.3)

## 2017-07-11 LAB — CBC WITH DIFFERENTIAL/PLATELET
BASOS PCT: 0.7 % (ref 0.0–3.0)
Basophils Absolute: 0 10*3/uL (ref 0.0–0.1)
EOS PCT: 1.7 % (ref 0.0–5.0)
Eosinophils Absolute: 0.1 10*3/uL (ref 0.0–0.7)
HCT: 42.3 % (ref 39.0–52.0)
Hemoglobin: 13.4 g/dL (ref 13.0–17.0)
LYMPHS ABS: 2.9 10*3/uL (ref 0.7–4.0)
Lymphocytes Relative: 39.4 % (ref 12.0–46.0)
MCHC: 31.6 g/dL (ref 30.0–36.0)
MCV: 79.7 fl (ref 78.0–100.0)
MONOS PCT: 11.6 % (ref 3.0–12.0)
Monocytes Absolute: 0.9 10*3/uL (ref 0.1–1.0)
NEUTROS ABS: 3.5 10*3/uL (ref 1.4–7.7)
NEUTROS PCT: 46.6 % (ref 43.0–77.0)
Platelets: 263 10*3/uL (ref 150.0–400.0)
RBC: 5.31 Mil/uL (ref 4.22–5.81)
RDW: 32 % — AB (ref 11.5–15.5)
WBC: 7.4 10*3/uL (ref 4.0–10.5)

## 2017-07-11 LAB — IBC PANEL
Iron: 137 ug/dL (ref 42–165)
Saturation Ratios: 36.9 % (ref 20.0–50.0)
Transferrin: 265 mg/dL (ref 212.0–360.0)

## 2017-07-13 ENCOUNTER — Telehealth: Payer: Self-pay | Admitting: Pulmonary Disease

## 2017-07-13 ENCOUNTER — Ambulatory Visit: Payer: Medicare Other | Admitting: Neurology

## 2017-07-13 NOTE — Telephone Encounter (Signed)
Spoke with patient, he wanted to know if he should continue to take 2 iron tablets daily. Advised patient that per SN's note, he wanted to him to decrease down to 1 tablet daily.   He verbalized understanding. Nothing further was needed at the time of call.

## 2017-08-08 ENCOUNTER — Ambulatory Visit (INDEPENDENT_AMBULATORY_CARE_PROVIDER_SITE_OTHER): Payer: Medicare Other | Admitting: Physician Assistant

## 2017-08-08 ENCOUNTER — Encounter: Payer: Self-pay | Admitting: Physician Assistant

## 2017-08-08 VITALS — BP 109/62 | HR 51 | Ht 70.0 in | Wt 185.4 lb

## 2017-08-08 DIAGNOSIS — I483 Typical atrial flutter: Secondary | ICD-10-CM | POA: Diagnosis not present

## 2017-08-08 DIAGNOSIS — R42 Dizziness and giddiness: Secondary | ICD-10-CM | POA: Diagnosis not present

## 2017-08-08 DIAGNOSIS — E785 Hyperlipidemia, unspecified: Secondary | ICD-10-CM

## 2017-08-08 DIAGNOSIS — E039 Hypothyroidism, unspecified: Secondary | ICD-10-CM

## 2017-08-08 MED ORDER — METOPROLOL SUCCINATE ER 25 MG PO TB24: 12.5000 mg | ORAL_TABLET | Freq: Every day | ORAL | 3 refills | Status: DC

## 2017-08-08 NOTE — Progress Notes (Signed)
Cardiology Office Note    Date:  08/10/2017   ID:  Gregory [name], DOB 1941-05-01, MRN [id number]  PCP:  Noralee Space, MD  Cardiologist:  Dr. Stanford Breed   Chief Complaint  Patient presents with  . Follow-up    seen for Dr. Stanford Breed    History of Present Illness:  Gregory [name] is a 76 y.o. 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 He has a brain abscess resulting in midline shift on CT of the head in May 2016. This was confirmed with stereotactic biopsy by neurosurgery. Culture grew out Peptostreptococcus species. He was treated with antibiotic including vancomycin, Rocephin and Flagyl. This was continued for a month after discharge. He was later placed on Keppra for seizure prophylaxis. It was felt his Osler-Weber-Rendu syndrome likely predisposed him for brain abscess.  Following discharge, he had an episode of elevated heart rate, EMS was called and he was noted to be in atrial flutter. This was converted to sinus rhythm spontaneously. Given monitor showed atrial flutter versus ectopic atrial tachycardia. Echocardiogram in August 2016 showed a normal LVEF, grade 2 diastolic dysfunction.  Patient presents today for cardiology office visit. He says he has been having twice weekly episodes of dizziness especially when he changes body position such as bending over to grab something and then stand up. He also has a very focal right-sided chest discomfort occurring 3 times a week as well. In last up to a minute each time. It is not associated with exertion. In fact he was able to work in his garden digging plants without any significant chest discomfort. We discussed various options including observation versus stress testing, he wished to observe for now. He is aware that if they become more prolonged or symptoms become more associated with exertion, he will need to let us know. Otherwise he  is dizziness sounds orthostatic in nature, his blood pressure is borderline. I will decrease Toprol-XL to 12.5 mg daily. He is aware there is a small chance for recurrent atrial fibrillation.     Past Medical History:  Diagnosis Date  . Anxiety state, unspecified   . Arthritis   . Benign neoplasm of colon   . GERD (gastroesophageal reflux disease)   . 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 History of nonmelanoma skin cancer EXCISION SQUAMOUS CELL FROM HAND  . Hypertrophy of prostate with urinary obstruction and other lower urinary tract symptoms (LUTS)   . Irritable bowel syndrome   . Lumbago   . Other diseases of nasal cavity and sinuses(478.19)   . Persistent disorder of initiating or maintaining sleep   . Plantar fascial fibromatosis   . Pure hypercholesterolemia   . Telangiectasia, hereditary hemorrhagic, of Rendu, Osler and Weber ([place]) OLSER'S DISEASE  (OWR)   SKIN, LIPS, NASAL W/ PREVIOUS NOSE BLEEDS  AMD GI TELANGIECTASIA  . Unspecified hypothyroidism     Past Surgical History:  Procedure Laterality Date  . APPLICATION OF CRANIAL NAVIGATION N/A 05/16/2015   Procedure: APPLICATION OF CRANIAL NAVIGATION;  Surgeon: Consuella Lose, MD;  Location: [facility];  Service: Neurosurgery;  Laterality: N/A;  . BRAIN BIOPSY Left 05/16/2015   Procedure: Stereotactic Left Brain Biopsy with Brain Lab;  Surgeon: Consuella Lose, MD;  Location: [facility];  Service: Neurosurgery;  Laterality: Left;  Stereotactic Left Brain biopsy with brainlab  . CARDIOVASCULAR STRESS TEST  01-14-2003   NO ISCHEMIA /  EF 61%/ NORMAL LE WALL MOTION  . EXCISION LEFT WRIST GANGLIAN/ MYXOID CYST  05-30-2009  . 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 OTHER SURGICAL HISTORY     pulse laser for facial telangiectasias inthe past  . removal of skin cancer from forehead  10/2012   Dr. Renda Rolls  . TRANSTHORACIC  ECHOCARDIOGRAM  10-17-2008   DR CRENSHAW   NORMAL LVF/ EF 60%/  MILDLY DILATED RIGHT ATRIUM/ VENTRICULE  . VARICOCELECTOMY  1991   Dr. Tresa Endo    Current Medications: Outpatient Medications Prior to Visit  Medication Sig Dispense Refill  . acetaminophen (TYLENOL) 325 MG tablet Take 650 mg by mouth daily as needed (pain).    [name] amoxicillin (AMOXIL) 500 MG capsule TAKE 4 CAPSULES 1 HOUR PRIOR TO PROCEDURE 4 capsule 4  . Cholecalciferol (VITAMIN D3) 2000 UNITS TABS Take 2,000 Units by mouth daily.    . clidinium-chlordiazePOXIDE (LIBRAX) 5-2.5 MG capsule Take 1 capsule by mouth 3 (three) times daily as needed. 60 capsule 5  . clorazepate (TRANXENE) 7.5 MG tablet TAKE 1 TABLET BY MOUTH 3 TIMES A DAY AS NEEDED 90 tablet 5  . Diphenhyd-Hydrocort-Nystatin (FIRST-DUKES MOUTHWASH) SUSP 1 tsp gargle and swallow four times daily 120 mL 5  . levocetirizine (XYZAL) 5 MG tablet Take 5 mg by mouth every evening.    [name] levothyroxine (SYNTHROID, LEVOTHROID) 125 MCG tablet TAKE 1 TABLET BY MOUTH  DAILY BEFORE BREAKFAST 90 tablet 3  . Magnesium 250 MG TABS Take 250 mg by mouth at bedtime.     . meclizine (ANTIVERT) 25 MG tablet Take 1 tablet (25 mg total) by mouth daily as needed for dizziness. 30 tablet 2  . methocarbamol (ROBAXIN) 500 MG tablet Take 500 mg by mouth 2 (two) times daily as needed for muscle spasms.    . montelukast (SINGULAIR) 10 MG tablet Take 10 mg by mouth at bedtime.    . Multiple Vitamin (MULTIVITAMIN WITH MINERALS) TABS tablet Take 1 tablet by mouth daily. Centrum    . pantoprazole (PROTONIX) 40 MG tablet Take 1 tablet (40 mg total) by mouth daily. 90 tablet 3  . silodosin (RAPAFLO) 8 MG CAPS capsule Take 1 capsule (8 mg total) by mouth at bedtime. 90 capsule 3  . metoprolol succinate (TOPROL-XL) 25 MG 24 hr tablet Take 1 tablet (25 mg total) by mouth daily. 90 tablet 3   No facility-administered medications prior to visit.      Allergies:   Nsaids; Other; Aspirin; and Statins    Social History   Social History  . Marital status: Married    Spouse name: [name]  . Number of children: 2  . Years of education: N/A   Occupational History  .      Consultant   Social History Main Topics  . Smoking status: Never Smoker  . Smokeless tobacco: Never Used  . Alcohol use No  . Drug use: No  . Sexual activity: Not Asked   Other Topics Concern  . None   Social History Narrative   Lives with wife in a one story home.  Has 2 sons.  Retired Veterinary surgeon.  Education: Masters degree.     Family History:  The patient's family history includes Diabetes in his mother; Healthy in his son; Hypertension in his mother and sister; Kidney failure in his father; Pancreatic cancer in his mother; Stroke in his mother.   ROS:   Please see the  history of present illness.    ROS All other systems reviewed and are negative.   PHYSICAL EXAM:   VS:  BP 109/62   Pulse (!) 51   Ht 5\' 10"  (1.778 m)   Wt 185 lb 6.4 oz (84.1 kg)   BMI 26.60 kg/m    GEN: Well nourished, well developed, in no acute distress  HEENT: normal  Neck: no JVD, carotid bruits, or masses Cardiac: RRR; no murmurs, rubs, or gallops,no edema  Respiratory:  clear to auscultation bilaterally, normal work of breathing GI: soft, nontender, nondistended, + BS MS: no deformity or atrophy  Skin: warm and dry, no rash Neuro:  Alert and Oriented x 3, Strength and sensation are intact Psych: euthymic mood, full affect  Wt Readings from Last 3 Encounters:  08/08/17 185 lb 6.4 oz (84.1 kg)  06/23/17 186 lb (84.4 kg)  06/07/17 184 lb 6 oz (83.6 kg)      Studies/Labs Reviewed:   EKG:  EKG is ordered today.  The ekg ordered today demonstrates Normal sinus rhythm without significant ST-T wave changes.  Recent Labs: 06/07/2017: TSH 1.37 07/11/2017: ALT 11; BUN 13; Creatinine, Ser 1.13; Hemoglobin 13.4; Platelets 263.0; Potassium 4.2; Sodium 135   Lipid Panel    Component Value Date/Time   CHOL 123  06/07/2017 1055   TRIG 47.0 06/07/2017 1055   HDL 41.50 06/07/2017 1055   CHOLHDL 3 06/07/2017 1055   VLDL 9.4 06/07/2017 1055   LDLCALC 72 06/07/2017 1055   LDLDIRECT 139.4 06/15/2007 0946    Additional studies/ records that were reviewed today include:  Echo 08/14/2015 LV EF: 55% -   60%  Study Conclusions  - Left ventricle: The cavity size was normal. Wall thickness was   normal. Systolic function was normal. The estimated ejection   fraction was in the range of 55% to 60%. Wall motion was normal;   there were no regional wall motion abnormalities. Features are   consistent with a pseudonormal left ventricular filling pattern,   with concomitant abnormal relaxation and increased filling   pressure (grade 2 diastolic dysfunction).  Impressions:  - Normal LV systolic function; grade 2 diastolic dysfunction; trace   MR and TR.    ASSESSMENT:    1. Dizziness   2. Typical atrial flutter ([place])   3. Hyperlipidemia, unspecified hyperlipidemia type   4. Hypothyroidism, unspecified type      PLAN:  In order of problems listed above:  1. Dizziness: Appears to be orthostatic in nature, given borderline blood pressure, I have reduced his Toprol-XL to 12.5 mg daily  2. Paroxysmal atrial flutter: Remote episode, no recurrence. Not on systemic anticoagulation unless recurs.  3. Hyperlipidemia: Intolerant to statins. Recent cholesterol panel obtained a 2 month ago showed cholesterol 123, triglyceride 47, HDL 41, LDL 72.  4. Hypothyroidism: On Synthroid    Medication Adjustments/Labs and Tests Ordered: Current medicines are reviewed at length with the patient today.  Concerns regarding medicines are outlined above.  Medication changes, Labs and Tests ordered today are listed in the Patient Instructions below. Patient Instructions  Medication Instructions:   REDUCE Toprol XL to 12.5mg  DAILY  (there is not a 12.5mg  tablet form -please take 1/2 of your 25mg  tablet  DAILY)  Labwork:   none  Testing/Procedures:  none  Follow-Up:  With Dr. Stanford Breed in 2-3 months   If you need a refill on your cardiac medications before your next appointment, please call your pharmacy.      Signed, Almyra Deforest, PA  08/10/2017 5:47 AM    [address] [facility] [address], [place], [place]  [address] Phone: [phone]; Fax: [phone]

## 2017-08-08 NOTE — Patient Instructions (Signed)
Medication Instructions:   REDUCE Toprol XL to 12.5mg  DAILY  (there is not a 12.5mg  tablet form -please take 1/2 of your 25mg  tablet DAILY)  Labwork:   none  Testing/Procedures:  none  Follow-Up:  With Dr. Stanford Breed in 2-3 months   If you need a refill on your cardiac medications before your next appointment, please call your pharmacy.

## 2017-08-10 ENCOUNTER — Encounter: Payer: Self-pay | Admitting: Physician Assistant

## 2017-09-08 ENCOUNTER — Ambulatory Visit (INDEPENDENT_AMBULATORY_CARE_PROVIDER_SITE_OTHER): Payer: Medicare Other

## 2017-09-08 DIAGNOSIS — Z23 Encounter for immunization: Secondary | ICD-10-CM

## 2017-09-16 DIAGNOSIS — Z23 Encounter for immunization: Secondary | ICD-10-CM | POA: Diagnosis not present

## 2017-10-10 ENCOUNTER — Other Ambulatory Visit (INDEPENDENT_AMBULATORY_CARE_PROVIDER_SITE_OTHER): Payer: Medicare Other

## 2017-10-10 ENCOUNTER — Other Ambulatory Visit: Payer: Self-pay | Admitting: Pulmonary Disease

## 2017-10-10 DIAGNOSIS — D500 Iron deficiency anemia secondary to blood loss (chronic): Secondary | ICD-10-CM | POA: Diagnosis not present

## 2017-10-10 LAB — CBC WITH DIFFERENTIAL/PLATELET
Basophils Absolute: 0.1 10*3/uL (ref 0.0–0.1)
Basophils Relative: 0.8 % (ref 0.0–3.0)
EOS PCT: 1.7 % (ref 0.0–5.0)
Eosinophils Absolute: 0.1 10*3/uL (ref 0.0–0.7)
HCT: 47.8 % (ref 39.0–52.0)
HEMOGLOBIN: 16.1 g/dL (ref 13.0–17.0)
LYMPHS ABS: 2.8 10*3/uL (ref 0.7–4.0)
Lymphocytes Relative: 40.9 % (ref 12.0–46.0)
MCHC: 33.8 g/dL (ref 30.0–36.0)
MCV: 90.3 fl (ref 78.0–100.0)
MONOS PCT: 11.4 % (ref 3.0–12.0)
Monocytes Absolute: 0.8 10*3/uL (ref 0.1–1.0)
NEUTROS PCT: 45.2 % (ref 43.0–77.0)
Neutro Abs: 3 10*3/uL (ref 1.4–7.7)
Platelets: 236 10*3/uL (ref 150.0–400.0)
RBC: 5.29 Mil/uL (ref 4.22–5.81)
RDW: 13.7 % (ref 11.5–15.5)
WBC: 6.7 10*3/uL (ref 4.0–10.5)

## 2017-10-10 LAB — IBC PANEL
IRON: 113 ug/dL (ref 42–165)
SATURATION RATIOS: 34.1 % (ref 20.0–50.0)
Transferrin: 237 mg/dL (ref 212.0–360.0)

## 2017-10-13 NOTE — Progress Notes (Signed)
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  Admitted 5/16 with headache and right lower extremity weakness. CT of the head demonstrated a brain mass measuring 3 x 2 cm with midline shift and edema. This was a ring enhancing mass in the left basal ganglia.He underwent stereotactic biopsy by neurosurgery. He was diagnosed with a brain abscess. Notes from ID indicate his cultures grew out Peptostreptococcus sp. Patient was followed by infectious disease in the hospital and placed on vancomycin, Rocephin and Flagyl. This was to be continued for one month post discharge. He was also placed on Keppra for seizure prophylaxis. 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   Following DC, patient had an episode of elevated heart rate. EMS was called and he was noted to be in atrial flutter. This converted to sinus rhythm spontaneously. Patient placed on toprol. Event monitor showed slow atrial flutter versus ectopic atrial tachycardia. Echocardiogram August 2016 showed normal LV systolic function, grade 2 diastolic dysfunction. Patient seen for dizziness August 2018 felt to be orthostatic. Toprol was decreased. Since he was last seen patient denies dyspnea on exertion, orthopnea, PND or pedal edema. He has no chest pain. He has some dizziness with standing suddenly. He has not had syncope. He continues to have occasional nosebleeds.  Current Outpatient Prescriptions  Medication Sig Dispense Refill  . acetaminophen (TYLENOL) 325 MG tablet Take 650 mg by mouth daily as needed (pain).    [name] amoxicillin (AMOXIL) 500 MG capsule TAKE 4 CAPSULES 1 HOUR PRIOR TO PROCEDURE 4 capsule 4  . Cholecalciferol (VITAMIN D3) 2000 UNITS TABS Take 2,000 Units by mouth daily.    . clidinium-chlordiazePOXIDE (LIBRAX) 5-2.5 MG capsule Take 1 capsule by mouth 3 (three)  times daily as needed. 60 capsule 5  . clorazepate (TRANXENE) 7.5 MG tablet TAKE 1 TABLET BY MOUTH 3 TIMES A DAY AS NEEDED 90 tablet 5  . Diphenhyd-Hydrocort-Nystatin (FIRST-DUKES MOUTHWASH) SUSP 1 tsp gargle and swallow four times daily 120 mL 5  . FERROUS SULFATE PO Take 65 mg by mouth 3 (three) times a week. Mon, wed, Fri    . levocetirizine (XYZAL) 5 MG tablet Take 5 mg by mouth every evening.    [name] levothyroxine (SYNTHROID, LEVOTHROID) 125 MCG tablet TAKE 1 TABLET BY MOUTH  DAILY BEFORE BREAKFAST 90 tablet 3  . Magnesium 250 MG TABS Take 250 mg by mouth at bedtime.     . meclizine (ANTIVERT) 25 MG tablet Take 1 tablet (25 mg total) by mouth daily as needed for dizziness. 30 tablet 2  . methocarbamol (ROBAXIN) 500 MG tablet Take 500 mg by mouth 2 (two) times daily as needed for muscle spasms.    . metoprolol succinate (TOPROL-XL) 25 MG 24 hr tablet Take 0.5 tablets (12.5 mg total) by mouth daily. 90 tablet 3  . montelukast (SINGULAIR) 10 MG tablet Take 10 mg by mouth at bedtime.    . Multiple Vitamin (MULTIVITAMIN WITH MINERALS) TABS tablet Take 1 tablet by mouth daily. Centrum    . pantoprazole (PROTONIX) 40 MG tablet Take 1 tablet (40 mg total) by mouth daily. 90 tablet 3  . silodosin (RAPAFLO) 8 MG CAPS capsule Take 1 capsule (8 mg total) by mouth at bedtime. 90 capsule 3  . vitamin C (ASCORBIC ACID) 500 MG tablet Take 500 mg by mouth daily.     No current facility-administered medications for this  visit.      Past Medical History:  Diagnosis Date  . Anxiety state, unspecified   . Arthritis   . Benign neoplasm of colon   . GERD (gastroesophageal reflux disease)   . 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 History of nonmelanoma skin cancer EXCISION SQUAMOUS CELL FROM HAND  . Hypertrophy of prostate with urinary obstruction and other lower urinary tract symptoms (LUTS)   . Irritable bowel syndrome   . Lumbago   . Other diseases of nasal cavity  and sinuses(478.19)   . Persistent disorder of initiating or maintaining sleep   . Plantar fascial fibromatosis   . Pure hypercholesterolemia   . 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 Unspecified hypothyroidism     Past Surgical History:  Procedure Laterality Date  . APPLICATION OF CRANIAL NAVIGATION N/A 05/16/2015   Procedure: APPLICATION OF CRANIAL NAVIGATION;  Surgeon: Consuella Lose, MD;  Location: [place] [facility];  Service: Neurosurgery;  Laterality: N/A;  . BRAIN BIOPSY Left 05/16/2015   Procedure: Stereotactic Left Brain Biopsy with Brain Lab;  Surgeon: Consuella Lose, MD;  Location: [place] [facility];  Service: Neurosurgery;  Laterality: Left;  Stereotactic Left Brain biopsy with brainlab  . CARDIOVASCULAR STRESS TEST  01-14-2003   NO ISCHEMIA / EF 61%/ NORMAL LE WALL MOTION  . EXCISION LEFT WRIST GANGLIAN/ MYXOID CYST  05-30-2009  . 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 OTHER SURGICAL HISTORY     pulse laser for facial telangiectasias inthe past  . removal of skin cancer from forehead  10/2012   Dr. Renda Rolls  . TRANSTHORACIC ECHOCARDIOGRAM  10-17-2008   DR Mads Borgmeyer   NORMAL LVF/ EF 60%/  MILDLY DILATED RIGHT ATRIUM/ VENTRICULE  . VARICOCELECTOMY  1991   Dr. Tresa Endo    Social History   Social History  . Marital status: Married    Spouse name: [name]  . Number of children: 2  . Years of education: N/A   Occupational History  .      Consultant   Social History Main Topics  . Smoking status: Never Smoker  . Smokeless tobacco: Never Used  . Alcohol use No  . Drug use: No  . Sexual activity: Not on file   Other Topics Concern  . Not on file   Social History Narrative   Lives with wife in a one story home.  Has 2 sons.  Retired Veterinary surgeon.  Education: Masters degree.    Family  History  Problem Relation Age of Onset  . Diabetes Mother   . Hypertension Mother   . Pancreatic cancer Mother   . Stroke Mother   . Kidney failure Father   . Hypertension Sister   . Healthy Son     ROS: Occasional nosebleeds and difficulty breathing through his nose but no fevers or chills, productive cough, hemoptysis, dysphasia, odynophagia, melena, hematochezia, dysuria, hematuria, rash, seizure activity, orthopnea, PND, pedal edema, claudication. Remaining systems are negative.  Physical Exam: Well-developed well-nourished in no acute distress.  Skin is warm and dry.  HEENT is normal.  Neck is supple.  Chest is clear to auscultation with normal expansion.  Cardiovascular exam is regular rate and rhythm.  Abdominal exam nontender or distended. No masses palpated. Extremities show no edema. neuro grossly intact  A/P  1 atrial flutter versus ectopic atrial tachycardia-patient has not had any further symptoms. Continue Toprol at present dose. We have elected not to anticoagulate given history of Osler Weber Rendu with AV malformations/GI bleeding and prior brain abscess.  2 hyperlipidemia-managed by primary care.  3 history of atypical chest pain-no further symptoms. No further ischemia evaluation at this point.  4 orthostatic symptoms-patient complains of dizziness with standing suddenly. I have asked him to increase his oral fluid intake and salt intake. We discussed standing slowly. I would like to continue low-dose beta blocker if he will tolerate. We can consider discontinuing beta blocker in the future if his symptoms worsen and we could also consider compression hose.  Kirk Ruths, MD

## 2017-10-14 ENCOUNTER — Telehealth: Payer: Self-pay | Admitting: Pulmonary Disease

## 2017-10-14 NOTE — Telephone Encounter (Signed)
Please notify patient>   Follow up labs showed Hg=16.1 (back to normal) & Fe is good at 113 w/ 34% saturation...  REC to decr the FeSO4 + VitD to 3x per week on MWF & stay on this...    lmomtcb x 1 for the pt.

## 2017-10-14 NOTE — Telephone Encounter (Signed)
Spoke with pt and notified of results per Dr. Nadel. Pt verbalized understanding and denied any questions. 

## 2017-10-14 NOTE — Telephone Encounter (Signed)
Pt returning call to nurse and can be reached @ [phone].Hillery Hunter

## 2017-10-18 NOTE — Progress Notes (Signed)
Called patient and gave results. Pt verbalized understanding and all question/concerns were answered. Advised to call back if they needed [place].

## 2017-10-21 ENCOUNTER — Ambulatory Visit (INDEPENDENT_AMBULATORY_CARE_PROVIDER_SITE_OTHER): Payer: Medicare Other | Admitting: Cardiology

## 2017-10-21 ENCOUNTER — Encounter: Payer: Self-pay | Admitting: Cardiology

## 2017-10-21 VITALS — BP 118/56 | HR 68 | Ht 70.0 in | Wt 185.4 lb

## 2017-10-21 DIAGNOSIS — I483 Typical atrial flutter: Secondary | ICD-10-CM | POA: Diagnosis not present

## 2017-10-21 DIAGNOSIS — E7800 Pure hypercholesterolemia, unspecified: Secondary | ICD-10-CM

## 2017-10-21 DIAGNOSIS — R42 Dizziness and giddiness: Secondary | ICD-10-CM

## 2017-10-21 NOTE — Patient Instructions (Signed)
Your physician wants you to follow-up in: ONE YEAR WITH DR CRENSHAW You will receive a reminder letter in the mail two months in advance. If you don't receive a letter, please call our office to schedule the follow-up appointment.   If you need a refill on your cardiac medications before your next appointment, please call your pharmacy.  

## 2017-10-24 ENCOUNTER — Other Ambulatory Visit: Payer: Self-pay | Admitting: Pulmonary Disease

## 2017-10-24 DIAGNOSIS — I483 Typical atrial flutter: Secondary | ICD-10-CM

## 2017-11-02 ENCOUNTER — Encounter: Payer: Self-pay | Admitting: Pulmonary Disease

## 2017-11-02 ENCOUNTER — Ambulatory Visit (INDEPENDENT_AMBULATORY_CARE_PROVIDER_SITE_OTHER): Payer: Medicare Other | Admitting: Pulmonary Disease

## 2017-11-02 ENCOUNTER — Ambulatory Visit (INDEPENDENT_AMBULATORY_CARE_PROVIDER_SITE_OTHER)
Admission: RE | Admit: 2017-11-02 | Discharge: 2017-11-02 | Disposition: A | Discharge status: Home / Self Care | Payer: Medicare Other | Source: Ambulatory Visit | Attending: Pulmonary Disease | Admitting: Pulmonary Disease

## 2017-11-02 VITALS — BP 118/68 | HR 62 | Ht 70.0 in | Wt 186.0 lb

## 2017-11-02 DIAGNOSIS — W19XXXA Unspecified fall, initial encounter: Secondary | ICD-10-CM | POA: Diagnosis not present

## 2017-11-02 DIAGNOSIS — I780 Hereditary hemorrhagic telangiectasia: Secondary | ICD-10-CM

## 2017-11-02 DIAGNOSIS — R0781 Pleurodynia: Secondary | ICD-10-CM | POA: Diagnosis not present

## 2017-11-02 DIAGNOSIS — J3489 Other specified disorders of nose and nasal sinuses: Secondary | ICD-10-CM

## 2017-11-02 DIAGNOSIS — I483 Typical atrial flutter: Secondary | ICD-10-CM

## 2017-11-02 MED ORDER — TRAMADOL HCL 50 MG PO TABS: 50.0000 mg | ORAL_TABLET | Freq: Three times a day (TID) | ORAL | 0 refills | Status: DC

## 2017-11-02 NOTE — Progress Notes (Signed)
Subjective:    Patient ID: Gregory [name], male    DOB: 06-25-1941, 76 y.o.   MRN: [id number]  HPI  76 y/o WM here for a follow up visit... he has multiple medical problems including:  Hx OWR syndrome;  Hx atypCP;  Hyperchol;  Hypothyroidism;  GERD/ Divertics/ IBS/ Colon polyps;  BPH/ BOO;  DJD/ LBP/ Plantar fasciitis;  Hx dizziness;  Anxiety & chronic persistant insomnia... 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  LABS 10/14:  FLP- on diet alone looks pretty good w/ LDL=111...  EKG 10/14 showed SBrady, rate57, otherw wnl;  2DEcho 10/14 showed normal LV size & wall thickness, norm LVF w/ EF=55-60%, Gr1DD, mildly thickened AoV leaflets, mild MR;  ETT 11/14 showed 82mn on treadmill, hypertensive BP response, rare PVC, no ischemia, O2 sats dropped to 87% w/ exercise...  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 Given this mild airflow obstruction we will start Rx w/ Dulera100- 2spBid   CXR 3/15 showed normal heart size, clear lungs, mild apical scarring, right basilar density c/w AVM based on CT report 2004, NAD..Marland KitchenMarland Kitchen EKG 3/15 showed NSR, rate87, low volt, NAD...  ADDENDUM> LABS 4/15 showed>  FLP- TChol 137, TG 30, HDL 36, LDL 95;  Chems- wnl;  CBC- anemic w/ Hg=11.3 & MCV=77;  TSH=9.26...   Ret for Iron panel (Fe=16- 4%sat), Ferritin (6.4), Stool cards (pending); then start FeSO4-3224mID w/ VitC500... Recheck CBC, Iron level in 6-8weeks...   Taking Synthroid112 regularly, therefore increase back to 12556md... Recheck TSH in 4839mo68moe never returned.  LABS 10/15:  CBC- wnl w/ Hg= 16.5, Fe=88 (23%sat),  Ferritin=36;  TSH=5.10... rec to continue Fe supplement daily...  CXR 4/16 showed norm heart size, clear lungs w/ some hyperinflation, RLL AVM noted w/o change...  EKG 4/16 revealed poor tracing- NSR, rate68, minor NSSTTWA, NAD...   LABS 4/16:  FLP- at goals on diet alone;  Chems- wnl;  CBC- wnl;  TSH=4.38...   ~  June 04, 2015:  839mo 6839mo& post hosp check>  Gregory wMikki Santeehosp 5/24 - 05/21/15 by [facility] after presenting w/ HA & right leg weakness; he tells me he saw his dentist for routine cleaning 2wks prior to this; eval revealed a brain lesion in left basal ganglionic region (tumor vs abscess); he had brain stereotactic bx 5/27 by DrNundkumar (left frontal craniectomy) & thick pus was removed- cultures +peptostreptococcus species (otherw neg aerobic bact cult, AFB, Fungi); he was seen by ID & placed on Rochepin, Vanco, Flagyl to be continued 39mo p94modisch (they are checking levels and adjusting dose); also started on Keppra500Bid per NS;  Since disch he is feeling better- no f/c/s, notes sl dry cough/ no sput, & the cough is keeping him awake- TUSSIONEX called in & helps he says;  Sl nausea relieved by phenergan prn...  EXAM reveals Afeb, VSS, O2sat=94% on RA at rest;  HEENT- OWR telangiectasias w/o change;  Chest- few basilar crackles w/o wheezing, rhonchi, consolidation;  Heart- RR w/o m/r/g;  Abd- neg;  Ext-  neg w/o c/c/e...  CT Head 5/16 showed 2 x 3 cm LEFT basal ganglia mass, likely tumor vs abscess, moderately extensive surrounding edema, small surgical clips within or near the posterior wall LEFT maxillary sinus/LEFT pterygopalatine fossa, in this patient with history of nose bleed (these are stable from prior scans)...  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   EKG 5/16 showed NSR, rate68, wnl, NAD.Marland KitchenMarland Kitchen  Ven Dopplers 6/16 were neg for DVT  LABS- reviewed> only pos cult= peptostreptococcus from brain bx;  Chems- ok x Na & Ca sl low;  CBC- ok x WBC 14=>11... 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 Given IS, refill Phenergan & Tussionex...  ~  July 31, 2015:  23moOV & add-on appt requested for several complaints>  [name] finished his antibiotic Rx for brain abscess (likely due to dental work several weeks prior to onset of neuro symptoms in [id number] DrCampbell has arranged for a follow up CT Brain 08/11/15 (this will be 3 wks off antibiotics);  He had an episode of tachycardia (?AFlutter w/ rvr per EMS) w/ HR incr to 170's- went to ER 7/13 (but was in NSR) & he is currently wearing a Holter monitor per DrCrenshaw w/ follow-up Cards appt sched for next week...        He presents today w/ several complaints>  1) hoarseness: note this is intermittent, off & on, swallowing OK, denies indigestion/ reflux/ etc; he is on Protonix40 w/ his hx of OWR, plus zofran/ librax; we reviewed antireflux regimen & agreed to refer him to ENT for a look at his larynx & their opinion...Marland KitchenMarland Kitchen2) c/o numbness in toes and bottom of his feet bilat for the last week or so w/ some discomfort while walking; no culprit meds on his list & we offered referral to Neuro for a neuropathy evaluation...Marland KitchenMarland Kitchen3) he is c/o weight loss> weight today is 167#, 5'10" Tall, BMI=24 which is wnl; last wt=182# 58m75moo for a 15# wt loss, appetite is good & intake unchanged  he says; we discussed caloric intake and nutritional supplements; we also checked labs> Chems- wnl, BS=103, A1c=5.7;  CBC- wnl; Note- he is on Synthroid125m3m and TSH= 3.28 when checked 07/03/15...   CXR 07/02/15 showed norm heart size, clear lungs, NAD...   CT Head 08/11/15 showed left frontal craniotomy, resolved abscess in left external capsule w/ decr edema and decr enhancement...  2DEcho 08/14/15 showed norm LV size & function w/ EF=55-60%, no regional wall motion abn, Gr2DD, trace MR & TR  ~  October 02, 2015:  58mo 47mo& Gregory hMikki Santeegained 4# up to #171# today on Ensure 1can/d:  We discussed the need for AMOXICILLIN 2gm 1H prior to dentist...    He saw ENT (we do not have note) & pt indicates that his cords were sl thinned, they offered injection rx but  his hoarseness is improved & holding off...     He saw DrCampbell, ID on 8/1> f/u anaerobic brain abscess after dental work & presented w/ right sided weakness (resolved); he finished 68moof Levaquin & Flagyl, no f/c/s, neuro exam was WNL, & f/u scan 8/22 w/ resolved abscess; pt tells me DrCampbell signed off...     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 Overall, these findings are severe in degree electrically with respect to sensory responses. 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  We reviewed prob list, meds, xrays and labs> NOTE: THEY WANT ANOTHER CT HEAD IN [id number]..  ~  January 01, 2016:  345moOV & BoMikki Santees stable- persistent neuropathy symptoms (?related to Flagyl Rx?), c/o cold hands, speech difficulty (tongue feels bigger), ?memory problem- offered speech path assessment but he notes Neuro f/u appt in several weeks & wants to wait;  Hoarseness improved w/ MMW;  DrCampbell, ID has signed off- pt & wife want another CT Head to f/u the prev brain abscess & we will order;  He knows to take AMOX prior to any procedures (due to his OWR)... No other interval visits in the last 57m27moe requests mult med refills- ok...  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    Hx Cough, mild COPD, AVM in RLL area w/ some hypoxemia during exercise> prev on Dulera100-2spBid, now off; breathing improved w/ exercise; O2 sat= 91% on RA today; c/o dry throat & encouraged to use lozenges etc...    Hx CP, episode of PAFlutter> w/u by DrCHilary Hertzs neg, no recurrent CP or arrhythmia- he remains active but notes DOE w/ stairs, ok on level ground, 2DEcho & GXT were OK x O2 sat drop to 87% at peak exercise- ?incr shunting thru his RLL AVM...    Chol> Intol to Simva40 & Cres5 due to musc cramps; FLP 4/16 on diet alone showed TChol 146, TG 85, HDL 35, LDL 94...    Hypothy>  On Synthroid 125m68m now>  Labs 7/16 revealed TSH= 3.28 & clinically euthyroid, continue same...    GI- GERD, IBS, Polyps> on Protonix40Qd & Zofran/ Librax prn; [facility]> note reviewed (heme pos stool & anemia from epistaxis & NSAIDs); he had colonoscopy 3/12- neg x for hems & DrM rec f/u 39yrs30yr   GU>  Hx BPH w/ BOO on Rapaflo 8mg/d58mr  DrEskridge; last seen 1/16 & doing satis & pt reports that his PSA was OK...    Ortho> on OTC meds prn; eval 12/11 by DrNorris for bilat leg pain & cramping, ?min atrophy of  left gastroc- he rec tonic water & neurology eval> he saw DrWillis 3/12 (note reviewed)> NCV was normal; EMG c/w mild S1 radiculopathy; he rec MRI- pt decided against the MRI;  He reports some discomfort in knees, hips, back & we decided on trial of Tramadol50...    Anxiety/ Insomnia>  He has Chlorazepate7.5 for Prn use, and Ambien10 to use for sleep Qhs... EXAM reveals Afeb, VSS, O2sat=91% on RA at rest;  Wt is up 8# to 179#; HEENT- OWR telangiectasias w/o change;  Chest- few basilar crackles w/o wheezing, rhonchi, consolidation;  Heart- RR w/o m/r/g;  Abd- neg;  Ext- neg w/o c/c/e...  LABS 1/17>  BMet- wnl  CT Brain 1/17> pending IMP/PLAN>>  Gregory [name] has mult somatic complaints lingering & he is still quite anxious; he wants another CT scan & we will order this; he is encouraged to continue his regular f/u appts w/ his specialists & we will recheck in 62month..  ADDENDUM>>  CT Brain1/17/17 w/ contrast showed no acute changes, area of hypoattenuation in left ext capsule & lentiform nucleus extending to the white matter of the centrum semiovale is improved from 07/2015, no abn post contrast enhacement- residual infection is unlikely... He will decide if he wants to repeat the scan in ~679mo.  ~  June 07, 2016:  66m77moV & pulmonary/medical follow up>  BobMikki Santees c/o persistent neuropathy symptoms and right upper lip is numb, otherw feeling well/ energy good/ no f/c/s, no cough/ no sput "except at night" he says;  Notes that he'll wake 1-2 x per week & wakes him up => we discussed reflux & a vigorous antireflux regimen (PPI before dinner, NPO after dinner, elev HOB)... We reviewed the following medical problems during today's office visit >>     OWR>  He has Osler-Weber-Rendu syndrome w/ mult telangiectasias (skin, lips, nasal w/ prev nose  bleeds- mult surg & occluded nares, AVM in RLL on CXR w/o hemoptysis);  Stable- doing satis w/o recurrent bleeding from nose...    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    Hx CP, episode of PAFlutter> w/u by DrCHilary Hertzs neg, no recurrent CP or arrhythmia- he remains active but notes DOE w/ stairs, ok on level ground, 2DEcho & GXT were OK x O2 sat dropped to 87% at peak exercise- ?incr shunting thru his RLL AVM... Marland KitchenMarland Kitchenen by Cards 2/17> OWR, AFlutter, HL; adm w/ brain abscess 5/16 (predisposed by his OWR?); on ToprolXL25, tol well, no recurrent arrhythmia...     Chol> Intol to Simva40 & Cres5 due to musc cramps; FLP 6/17 on diet alone showed TChol 155, TG 60, HDL 42, LDL 101...    Hypothy>  On Synthroid 1266m57m now>  Labs 6/17 revealed TSH= 2.41 & clinically euthyroid, continue same...    GI- GERD, IBS, Polyps> on Protonix40Qd & Zofran/ Librax prn; [facility]> note reviewed (heme pos stool & anemia from epistaxis & NSAIDs); he had colonoscopy 3/17 w/ divertics, hems, no polyps...    GU>  Hx BPH w/ BOO prev on Rapaflo 8mg/39mer DrEskridge; urodynamics showed small capacity hypersens bladder- note 12/2015 reviewed...    Ortho/ Neuro- neuropathy, hx brain abscess> on OTC meds prn; eval 12/11 by DrNorris for bilat leg pain & cramping, ?min atrophy of left gastroc- he rec tonic water &  neurology eval> he saw DrWillis 3/12 (note reviewed)> NCV was normal; EMG c/w mild S1 radiculopathy; he rec MRI- pt decided against the MRI;  He reports some discomfort in knees, hips, back & we decided on trial of Tramadol50... He saw DrPatel 12/2015- hx brain abscess 5/16, followed by neuropathy symptoms, and neuro notes reviewed...     Anxiety/ Insomnia>  He has Chlorazepate7.5 for Prn use, and Ambien10 to use for sleep Qhs... EXAM  reveals Afeb, VSS, O2sat=91% on RA at rest;  Wt is up 8# to 179#; HEENT- OWR telangiectasias w/o change;  Chest- few basilar crackles w/o wheezing, rhonchi, consolidation;  Heart- RR w/o m/r/g;  Abd- neg;  Ext- neg w/o c/c/e...  LABS 06/09/16>  FLP- all parameters at goals on diet alone;  Chems- wnl;  CBC- wnl;  TSH=2.41;  PSA=1.58 IMP/PLAN>>  We reviewed his reflux symptoms and advised a vigorous antireflux regimen w/ Protonix40 before dinner, NPO after dinner, Elev HOB; otherw same meds 7 [name] in 67mosooner prn...  ~  December 07, 2016:  6724moOV & Gregory reports doing well- no new complaints or concerns "I feel good"; he does note some right leg numbness but notes that his upper lip is better and his toe neuropathy is better...     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    He has Hx CP & PAFlutter> followed by DrHilary Hertz stable on MetoprololER25/d...    [facility] & note reviewed- OWR, GERD, divertics, colon adenoma; prev on Protonix40 but concern for memory [facility] He had EGD 09/2016 w/ gastric & duodenal AVMs noted...    He had Neuro f/u DrPatel 08/25/16> hx brain abscess (occurred after dental work), bilat feet paresthesias (thought to be due to Flagyl), intol to Gabapentin w/ GI side effects, PT helped; noted recent vision issues...     He had Urology appt w/ [facility]equ[facility]- 07/26/16> prev followed by DrEskridge, c/o freq/ urgency/ nocturia, improved on Myrbetriq50 EXAM reveals Afeb, VSS, O2sat=94% on RA at rest;  Wt is stable at 183#; HEENT- OWR telangiectasias w/o change;  Chest- few basilar crackles w/o wheezing, rhonchi, consolidation;  Heart- RR w/o m/r/g;  Abd- neg;  Ext- neg w/o c/c/e... IMP/PLAN>>  BoMikki Santees stable & doing reasonably well; he says DrMedoff stopped his Protonix because he was on it for yrs and over concern for  dementia & switched him to [employer] We gave him the Prevnar-13 vaccine today & now he is up-to-date...  ~  June 07, 2017:  24m45moV & Gregory reports that he's had some signif right shoulder pain- seen by Ortho but we do not have notes from them, states he was given a musc relaxer (no NSAIDs) & getting PT;  He also reports a f/u w/ allergy- DrSharma for "cough" and given Singulair10/ Xyzal5 which has helped... We reviewed the following medical problems during today's office visit >>     S/p bilat cataract surg DrBevis 2018    OWR>  He has Osler-Weber-Rendu syndrome w/ mult telangiectasias (skin, lips, nasal w/ prev nose bleeds- mult surg & occluded nares, AVM in RLL on CXR w/o hemoptysis, & upper GI tract);  Stable overall w/ only intermittent epistaxis, denies GI bleeding, etc... He is again anemic & we'll need to re-evaluate & rx his iron decifiency...    AR>  followed by DrSharma & recently started on Singulair10 + Xyzal5 w/ improvement in symptoms...    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    Hx CP, episode of PAFlutter> w/u by Hilary Hertz was neg, no recurrent CP or arrhythmia- he remains active but notes DOE w/ stairs, ok on level ground, 2DEcho & GXT were OK x O2 sat dropped to 87% at peak exercise- ?incr shunting thru his RLL AVM.Marland KitchenMarland Kitchen Seen by Cards 2/18> OWR, AFlutter, HL; adm w/ brain abscess 5/16 (predisposed by his OWR?); on ToprolXL25, tol well, no recurrent arrhythmia...     Chol> Intol to Simva40 & Cres5 due to musc cramps; FLP 6/18 on diet alone showed TChol 123, TG 47, HDL 42, LDL 72...    Hypothy>  On Synthroid 158mg/d now>  Labs 6/18 revealed TSH= 1.37 & clinically euthyroid, continue same...    GI- GERD, IBS, Polyps> on Protonix40Qd & Zofran/ Librax prn; [facility]> note reviewed (heme pos stool &  anemia from epistaxis & NSAIDs); he had colonoscopy 3/17 w/ divertics, hems, no polyps...    GU>  Hx BPH w/ BOO on Rapaflo 813md per DrEskridge; urodynamics showed small capacity hypersens bladder- note 12/2015 reviewed; now followed by DrCarmie Endseen 2/18 & note reviewed, frequency & nocturia, Myrbetriq helped but too $$, back on Rapaflo8...    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 He saw DrPatel for f/u 12/2016- hx brain abscess 5/16, followed for neuropathy symptoms, and neuro notes reviewed=> placed on Baclofen for musc spasticity...    Anxiety/ Insomnia>  He has Chlorazepate7.5 for Prn use...    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 EXAM reveals Afeb, VSS, O2sat=93% on RA at rest;  Wt is stable ~185#; HEENT- OWR telangiectasias w/o change & nasal obstruction  Chest- few basilar crackles w/o wheezing, rhonchi, consolidation;  Heart- RR w/o m/r/g;  Abd- neg;  Ext- neg w/o c/c/e...  LABS 06/07/17>  FLP- all parameters at goals on diet alone now;  Chems- wnl;  CBC- anemic w/ Hg-9.4, mcv=66;  TSH=1.37;  PSA=1.82...  Pt asked to return for anemia eval w/ repeat CBC  Fe  Ferritin  B12  SPE/IEP - all pending... 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  ADDENDUM>>  Additional labs confirm IDA w/ Hg=9.0, mcv=66, Fe=5 (1.1%sat), B12=817, SPE w/ low TProt/Alb & no M-spike... Rec to start FeSO4 325mg34m w/ VitC  and rec to incr protein intake daily... We plan recheck CMet & CBC/ iron panel in 20month..  ADDENDUM>>  LABS 07/11/17 showed CMet- wnl;  CBC- much improved w/ Hg=13.4, mcv=80;  Iron=137 (37%sat)... REC to decr FeSO4 to one daily w/ VitC & repeat CBC, Iron panel in 322mobefore halloween)... ADDENDUM>>  Labs 10/10/17 showed Hg=16.1 & Fe is good at 113 w/ 34% saturation;  REC to decr the FeSO4 + VitD to 3x per week on MWF & stay on this...   ~  November 02, 2017:  93m48moV & add-on appt requested after fall>  BobMikki Santeeports that he fell ~1wk ago (tripped in his garage); he hit the floor w/ his left hip/ buttock- c/o pain in left flank region (sore, tender, can't sleep);  There is no bruising and no bowel or bladder problems/ changes;  He denies cough, sput, hemoptysis, or trouble breathing; we discussed checking XRay of left lower ribs (neg), and treatment w/ rest/ heat/ Tramadol + Tylenol prn...     EXAM reveals Afeb, VSS, O2sat=92% on RA at rest;  Wt is stable ~186#; HEENT- OWR telangiectasias w/o change & nasal obstruction  Chest- few basilar crackles w/o wheezing, rhonchi, consolidation;  There is a sl bruise on his left flank area & it's tender to palp;  Heart- RR w/o m/r/g;  Abd- neg;  Ext- neg w/o c/c/e...  Left Rib Detail films 11/02/17>  No rib fx identified, lung is clear & wnl... IMP/PLAN>>  We discussed Rx w/ REST (rib binder if nec), HEAT, Tramadol50 + Tylenol500 as needed for pain;  He will call for any concerns going forward;  He is continuing w/ balance exercises etc...            Problem List:   BRAIN ABSCESS  >>  BobMikki Santees hosp 5/24 - 05/21/15 by [facility] after presenting w/ HA & right leg  weakness; he tells me he saw his dentist for routine cleaning 2wks prior to this; eval revealed a brain lesion in left basal ganglionic region (tumor vs abscess); he had brain stereotactic bx 5/27 by DrNundkumar (left frontal craniectomy) & thick pus was removed- cultures +peptostreptococcus species (otherw neg aerobic bact cult, AFB, Fungi); he was seen by ID & placed on Rochepin, Vanco, Flagyl to be continued 69mo66mot disch (they are checking levels and adjusting dose); also started on Keppra500Bid per NS... ~  6/16:  Since disch he is feeling better- no f/c/s, notes sl dry cough/ no sput, & the cough is keeping him awake- TUSSIONEX called in & helps he says;  Sl nausea relieved by phenergan prn...  ~  8/16:  He was checked by DrCampbell, ID- they stopped antibiotics and rec f/u CT Brain 08/11/15= 3 wks off the antibiotic rx => resolved abscess. ~  1/17:  CT Brain - pending  OTHER DISEASES OF NASAL CAVITY AND SINUSES - Hx of Osler-Weber-Rendue syndrome/ hereditary telangiectasias... he's had numerous nose bleeds and surgery by DrLeEtter Sjogrenw followed by DrRoBarbaraann Fasterleft nares is occluded w/ skin graft- no lumen... this is quite uncomfortable to the patient but he's been told there is nothing further that can be done for this... ~  3/11:  notes occas nosebleeds but he is able to handle them... ~  4/12:  No diff in his pattern of mild nose bleeds & he denies any severe epistaxis episodes... 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 ~  4/14:  He notes infreq bleeding now w/ saline etc... 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 ~  4/16:  He has Osler-Weber-Rendu syndrome w/ mult telangiectasias (skin, lips, nasal w/ prev  nose bleeds- mult surg & occluded nares, AVM in RLL on CXR w/o hemoptysis);  Stable- doing satis w/o recurrent bleeding from nose.  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 Dyspnea w/ O2 desat w/ exercise >>  ~  12/14:  PFTs showed FVC=3.98 (89%), FEV1=2.72 (79%), %1sec=68, and mid-flows= 51% predicted; c/w mild obstructive dis & we decided on a trial of Dulera100-2spBid ~  4/15:  His breathing is improved w/ the [facility] and exercise program... ~  10/15:  His breathing is back to baseline after FeSO4 rx for anemia & ret of Hg to normal; he has stopped the [facility] & has it handy for prn use... ~  4/16:  prev on Dulera100-2spBid, now just prn; breathing improved w/ exercise; O2 sat= 93% on RA today; c/o dry throat & encouraged to use lozenges etc... ~  CXR 4/16 showed norm heart size, clear lungs w/ some hyperinflation, RLL AVM noted w/o change... 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 known GI telangiectasia as well without signif GI bleeding in the past- followed by [facility]... 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 extensive nasal problems as above... ~  labs 2/09 showed Hg= 17.0 ~  labs 2/10 showed Hg= 16.4 ~  CXR 3/11 is chr incr markings esp RLL- no change, NAD...  ~  Labs 6/11 showed Hg= 15.9 ~  Labs 4/12 showed Hg= 16.0 ~  CXR 10/12 showed mild biapical scarring, RLL AVM, NAD... ~  Labs 4/13 showed Hg= 15.2 ~  CXR 8/13 showed heart at upper lim of norm, increased markings & apical scarring, chr incr markings in RLL- AVM, NAD.[name]. ~  CXR 10/14 showed norm heart size, stable w/ known  AVM in RLL, NAD ~  12/14: he had desat to 87% on RA when having a treadmill test recently; this is believed to be from incr shunting thru his RLL AVM.[name]. ~  CXR 3/15 showed normal heart size, clear lungs, mild apical scarring, right basilar density c/w AVM based on CT report 2004, NAD.[name]. ~  CXR 4/16 showed norm heart size, clear lungs w/ some hyperinflation, RLL AVM noted w/o change... 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  Hx of CHEST PAIN (ICD-786.50) - seen Sep09 w/ several recent bouts of CP while working in yard, washing car, etc... resolved w/ rest & Protonix... EKG showed WNL, prev NuclearStressTest 1/04 at [facility] was neg (no ischemia or infarction and EF= 61%)... Cardiac eval DrCrenshaw w/ 2DEcho 10/09 showing norm LV- no regional wall motion abn, norm  LVF w/ EF= 60%, mild dil RA/ RV;  and norm MYOVIEW (no ischemia or infarction, EF= 58%)... 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 Holter monitor placed and results pending...   HYPERCHOLESTEROLEMIA, MILD (ICD-272.0) - now on CRESTOR '5mg'$ /d>  Prev Rx w/ Simva40 but developed muscle cramping which resolved off the med. ~  FLP 6/08 showed TChol 208, TG 75, HDL 34, LDL 139... he preferred diet Rx- ~  FLP 2/09 showed TChol 172, TG 69, HDL 30, LDL 129... he agreed to  CFranklin.. ~  FM[place]8/09 on Crestor10 showed TChol 91, TG 49, HDL 28, LDL 54... rec- keep same. ~  FLP 2/10 on Crestor10 showed TChol 108, TG 56, HDL 31, LDL 66 ~  9/10:  we discussed changing Crestor10 to Simvastatin40 for $$$ reasons... ~  FB[place]3/11 on Simva40 showed TChol 105, TG 59, HDL 39, LDL 54... continue same. ~  [place]4/12 on Simva40 showed TChol 104, TG 62, HDL 29, LDL 63... C/o severe muscle cramps & wants to stop for 169mocramping resolved! ~  Referred to [facility] & they restarted his Crestor at '5mg'$ /d> tol well so far... ~  FLP 10/12 on Cres5 showed TChol 114, TG 40, HDL 36, LDL 70 ~  FLP 2/13 on Cres5 showed TChol 124, TG 21, HDL 40, LDL 80 ~  FLP 4/14 on Cres5 showed TChol 119, TG 44, HDL 38, LDL 72  ~  6/14: pt stopped Cres5 on his own due to leg cramps & decided to hold statins for now w/ repeat FLP off meds in several months... ~  FLP 10/14 on diet alone showed TChol 155, TG 29, HDL 38, LDL 111  ~  FLP 4/15 on diet alone showed TChol 137, TG 30, HDL 36, LDL 95 ~  FLP 4/16 on diet alone showed TChol 146, TG 85, HDL 35, LDL 94  HYPOTHYROIDISM - on SYNTHROID 11220md now; prev 125m9m dose was decreased 4/12... ~  labs 2/09 showed TSH= 0.69...  8/09 showed TSH= 0.44... ~  labs 2/10 showed TSH= 0.68 ~  labs 3/11 showed TSH= 0.52 ~  Labs 4/12 on Synthroid125 showed TSH= 0.33... We decided to decr the dose to 112/d. ~  Labs 4/13 on Levo112 showed TSH= 4.60 ~  Labs 4/14 on Levo112 showed TSH= 5.46 ~  Labs 4/15 on Levo112 showed TSH= 9.26 and we decided to incr dose back to 125mc29m.. ~  Labs 4/16 on Levo125 showed TSH= 4.38  GERD, IRRITABLE BOWEL SYNDROME, COLONIC POLYPS - [facility] (he tried OTC Zantac '75mg'$  on his own)... [facility]  [facility]  colonoscopy 1/04 w/ divertics, diminutive rectal polyp (adenomatous), hems...  ~  f/u colon 1/07 showed no recurrent polyps... 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 doing well without LTOS, just complains of nocturia x 1-2 ~  labs 2/09 showed PSA= 0.76 ~  labs 2/10 showed PSA= 0.90 ~  2011 PSA checked by DrDavis (pt states it was OK)... ~  Labs 4/12 showed PSA= 1.22 ~  Labs 4/13 showed PSA= 1.31 ~  10/13: presents c/o incr freq, nocturia x2-4, & weak stream despite Rapaflo; he is intol to Flomax; refer to Urology for further eval=> seen by DrEskridge, no changes made... ~  1/15: f/u by Urology, DrEskridge> BPH, LUTS, hypersens unstable bladder; on Rapaflo8 & doing satis, PSA reported to be 2.34...  LEFT ARM & SHOULDER PAIN >> he was checked by Lacie Scotts but not improving he says; we discussed second opinion at the [facility], DrSypher==> notes reviewed.  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 ~  9/10:  requesting Rx for Robaxin for muscle spasm as needed.  PLANTAR FASCIITIS (ICD-728.71) - c/o pain in his feet secondary to plantar fasciitis eval & Rx from [facility] w/ Celebrex & shots... ~  9/10: we discussed change to [facility] - symptoms improved.  Hx NOCTURNAL LEG CRAMPS >>  ~  This initially occurred several yrs ago while on Simvastatin & the cramps resolved off this med; he was subseq placed on Cres5 & tol well... ~  6/14: he indicates that noct leg cramps returned x1wk & he stopped the Cres5 w/ improvement; he wants to leave off the statins for now; rec to try tonic water/ yellow mustard prn...  Hx of DIZZINESS (ICD-780.4) - sudden episode dizziness Sep09 working at [employer] around [employer] w/ room spinning- lasted 64mn and resolved spontaneously, felt light headed afterwards... no LOC, syncope, seizure activity, etc... no CP/ palpit/ SOB/ etc... there were several EMTs in the restaurant and they checked him out- stable and transported to  ER... 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  ANEMIA >>  ~  LABS 4/13 showed Hg= 15.2, MCV=91 ~  LABS 4/14 showed Hg= 14.5, MCV=84 ~  LABS 3/15 showed Hg= 13.6, MCV=78 ~  LABS 4/15 showed Hg= 11.3, MCV= 77, Iron studies showed Fe= 16 (3.6%sat), Ferritin= 6.4, & stool cards neg; Rx w/ FeSO4... ~  Labs 10/15 showed Hg= 16.5, MCV= 91, Fe=88 (23%sat), Ferritin= 36...  ~  Labs 4/16 showed Hg= 16.0  Health Maintenance: ~  [facility]  [facility] etc... (last 1/07 & 551yr/u due 1/12). ~  GU:  followed by DrRDavis in past; PSAs have all been wnl... ~  Immunizations:  he gets yearly Flu vaccinations in the fall;  OK Pneumovax 03/12/10 at age- [age]    Past Surgical History:  Procedure Laterality Date  . CARDIOVASCULAR STRESS TEST  01-14-2003   NO ISCHEMIA / EF 61%/ NORMAL LE WALL MOTION  . EXCISION LEFT WRIST GANGLIAN/ MYXOID CYST  05-30-2009  . 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 OTHER SURGICAL HISTORY  pulse laser for facial telangiectasias inthe past  . removal of skin cancer from forehead  10/2012   Dr. Renda Rolls  . TRANSTHORACIC ECHOCARDIOGRAM  10-17-2008   DR CRENSHAW   NORMAL LVF/ EF 60%/  MILDLY DILATED RIGHT ATRIUM/ VENTRICULE  . VARICOCELECTOMY  1991   Dr. Tresa Endo    Outpatient Encounter Medications as of 11/02/2017  Medication Sig  . acetaminophen (TYLENOL) 325 MG tablet Take 650 mg by mouth daily as needed (pain).  [name] amoxicillin (AMOXIL) 500 MG capsule TAKE 4  CAPSULES 1 HOUR PRIOR TO PROCEDURE  . Cholecalciferol (VITAMIN D3) 2000 UNITS TABS Take 2,000 Units by mouth daily.  . clidinium-chlordiazePOXIDE (LIBRAX) 5-2.5 MG capsule Take 1 capsule by mouth 3 (three) times daily as needed.  . clorazepate (TRANXENE) 7.5 MG tablet TAKE 1 TABLET BY MOUTH 3 TIMES A DAY AS NEEDED  . Diphenhyd-Hydrocort-Nystatin (FIRST-DUKES MOUTHWASH) SUSP 1 tsp gargle and swallow four times daily  . FERROUS SULFATE PO Take 65 mg by mouth 3 (three) times a week. Mon, wed, Fri  . levocetirizine (XYZAL) 5 MG tablet Take 5 mg by mouth every evening.  [name] levothyroxine (SYNTHROID, LEVOTHROID) 125 MCG tablet TAKE 1 TABLET BY MOUTH  DAILY BEFORE BREAKFAST  . Magnesium 250 MG TABS Take 250 mg by mouth at bedtime.   . meclizine (ANTIVERT) 25 MG tablet Take 1 tablet (25 mg total) by mouth daily as needed for dizziness.  . metoprolol succinate (TOPROL-XL) 25 MG 24 hr tablet TAKE 1 TABLET BY MOUTH  DAILY  . montelukast (SINGULAIR) 10 MG tablet Take 10 mg by mouth at bedtime.  . Multiple Vitamin (MULTIVITAMIN WITH MINERALS) TABS tablet Take 1 tablet by mouth daily. Centrum  . pantoprazole (PROTONIX) 40 MG tablet TAKE 1 TABLET BY MOUTH  DAILY  . silodosin (RAPAFLO) 8 MG CAPS capsule Take 1 capsule (8 mg total) by mouth at bedtime.  . vitamin C (ASCORBIC ACID) 500 MG tablet Take 500 mg 3 (three) times a week by mouth.   . [DISCONTINUED] methocarbamol (ROBAXIN) 500 MG tablet Take 500 mg by mouth 2 (two) times daily as needed for muscle spasms.  . traMADol (ULTRAM) 50 MG tablet Take 1 tablet (50 mg total) 3 (three) times daily by mouth.   No facility-administered encounter medications on file as of 11/02/2017.     Allergies  Allergen Reactions  . Nsaids Other (See Comments)    Cannot take-per MD  . Other Other (See Comments)    All blood thinners-cannot take per MD  . Aspirin Other (See Comments)    nosebleeds  . Statins Other (See Comments)    Weakness, myalgias    Immunization  History  Administered Date(s) Administered  . Influenza Split 09/21/2011, 09/27/2012  . Influenza Whole 09/11/2008, 09/11/2009, 09/22/2010  . Influenza, High Dose Seasonal PF 09/09/2016, 09/16/2017  . Influenza,inj,Quad PF,6+ Mos 10/04/2013, 10/08/2014, 10/02/2015  . Pneumococcal Conjugate-13 12/07/2016  . Pneumococcal Polysaccharide-23 03/12/2010  . Tdap 10/04/2013  . Varicella 12/20/2009     Current Medications, Allergies, Past Medical History, Past Surgical History, Family History, and Social History were reviewed in [employer] record.    Review of Systems       See HPI - other systems neg except as noted... The patient denies anorexia, fever, weight loss, weight gain, vision loss, decreased hearing, hoarseness, chest pain, syncope, dyspnea on exertion, peripheral edema, prolonged cough, headaches, hemoptysis, abdominal pain, melena, hematochezia, severe indigestion/heartburn, hematuria, incontinence, muscle weakness, suspicious skin lesions, transient blindness, difficulty walking,  depression, unusual weight change, abnormal bleeding, enlarged lymph nodes, and angioedema.     Objective:   Physical Exam      WD, WN, 76 y/o WM in NAD... 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 GENERAL:  Alert & oriented; pleasant & cooperative... HEENT:  [place]/AT, EOM-wnl, PERRLA, EACs-clear, TMs-wnl, NOSE- occluded left nares from skin grafts, MOUTH- mucosal telangiectasias.  NECK:  Supple w/ fairROM; no JVD; normal carotid impulses w/o bruits; no thyromegaly or nodules palpated; no lymphadenopathy. CHEST:  Clear to P & A; without wheezes/ rales/ or rhonchi. HEART:  Regular Rhythm; without murmurs/ rubs/ or gallops. ABDOMEN:  Soft & nontender; normal bowel sounds; no organomegaly or masses detected. EXT: without deformities, mild arthritic changes; no varicose veins/ venous insuffic/ or edema. NEURO:  CN's intact; motor testing normal; sensory testing normal; gait normal  & balance OK. DERM:  mult telangiectasias...  RADIOLOGY DATA:  Reviewed in the EPIC EMR & discussed w/ the patient...  LABORATORY DATA:  Reviewed in the EPIC EMR & discussed w/ the patient...   Assessment & Plan:    10/23/17>  Pt tripped in his garage & fell on his left side w/ sl bruise & tenderness over the lower ribs; XRay NEG for Fx & we discussed Rx w/ REST (rib binder if nec), HEAT, Tramadol50 + Tylenol500 as needed for pain;  He will call for any concerns going forward;  He is continuing w/ balance exercises etc...    BRAIN ABSCESS >> [facility] 5/16, followed by ID, DrCampbell, finished antibiotic rx 07/2015 and f/u CT Brain=> resolved abscess... He will need antibiotic prophylaxis prior to future dental work...  Hx Cough, Dyspnea on exertion, mild COPD >> as above, prev on Dulera100- 2sp Bid & exercise program- improved; we will continue to monitor his status & O2 sats; Dyspnea improved w/ Fe for anemia & ret of Hg to norm... He has Tussionex prn cough... 8/16> c/o intermittent hoarseness & referred to ENT for eval of cords => we don't have note but pt indicates sl thinning of cords and they offered injections but he's holding off...  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     Hx CP, AFlutter>  Prev cardiac w/u by DrCrenshaw was neg, no recurrent CP complaints, but he notes DOE w/ stairs & DrCrenshaw repeated 2DEcho & ETT as above... Episode of PAFlutter 06/2015=> holter monitor per cards- pending & pt improved on ToprolXL25...      Chol>  Off statins due to recurrent cramps, on diet alone & FLP is reasonable...     Hypothy>  On  Synthroid 190mg/d now>  He is clinically & biochem euthyroid...     [facility]>  [facility]- neg x for hems & DrM rec f/u 554yr..     GU>  Hx BPH w/ BOO & followed by DrEskridge on Rapaflo '8mg'$ /d & stable...     Ortho>  See above- prev stable on OTC analgesics as needed, but recent incr symptoms- try Tramadol50 prn...  LEG CRAMPS >> he is quite sure they are from his statin Rx & we decided to leave these off  for now, try to control on diet/ exercise & continued monitoring... 8/16> c/o numbness in toes & bottom of feet 8/16>  Refer to Neuro for neuropathy eval...     Anxiety/ Insomnia>  He has Chlorazepate for Prn use, and Ambien to use for sleep Qhs...  Anemia>  Prev on FeSO4 supplement & Hg now back to normal, dyspnea ret to baseline 7 he stopped the Fe... 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     Medication List        Accurate as of 11/02/17  4:20 PM. Always use your most recent med list.          acetaminophen 325 MG tablet Commonly known as:  TYLENOL   amoxicillin 500 MG capsule Commonly known as:  AMOXIL TAKE 4 CAPSULES 1 HOUR PRIOR TO PROCEDURE   clidinium-chlordiazePOXIDE 5-2.5 MG capsule Commonly known as:  LIBRAX Take 1 capsule by mouth 3 (three) times daily as needed.   clorazepate 7.5 MG tablet Commonly known as:  TRANXENE TAKE 1 TABLET BY MOUTH 3 TIMES A DAY AS NEEDED   FERROUS SULFATE PO   FIRST-DUKES MOUTHWASH Susp 1 tsp gargle and swallow four times daily   levocetirizine 5 MG tablet Commonly known as:   XYZAL   levothyroxine 125 MCG tablet Commonly known as:  SYNTHROID, LEVOTHROID TAKE 1 TABLET BY MOUTH  DAILY BEFORE BREAKFAST   Magnesium 250 MG Tabs   meclizine 25 MG tablet Commonly known as:  ANTIVERT Take 1 tablet (25 mg total) by mouth daily as needed for dizziness.   metoprolol succinate 25 MG 24 hr tablet Commonly known as:  TOPROL-XL TAKE 1 TABLET BY MOUTH  DAILY   montelukast 10 MG tablet Commonly known as:  SINGULAIR   multivitamin with minerals Tabs tablet   pantoprazole 40 MG tablet Commonly known as:  PROTONIX TAKE 1 TABLET BY MOUTH  DAILY   silodosin 8 MG Caps capsule Commonly known as:  RAPAFLO Take 1 capsule (8 mg total) by mouth at bedtime.   traMADol 50 MG tablet Commonly known as:  ULTRAM Take 1 tablet (50 mg total) 3 (three) times daily by mouth.   vitamin C 500 MG tablet Commonly known as:  ASCORBIC ACID   Vitamin D3 2000 units Tabs       Where to Get Your Medications    You can get these medications from any pharmacy   Bring a paper prescription for each of these medications  traMADol 50 MG tablet

## 2017-11-02 NOTE — Patient Instructions (Signed)
Today we updated your med list in our EPIC system...    Continue your current medications the same...  We decided to check left rib detail films to check the area of your injury...    We will contact you w/ the results when available...   For the pain >>    REST, APPLY HEATING PAD, etc...    Use the TRAMADOL 50mg  up to 3 times daily & take it with an extra-strength TYLENOL tab...  Call for any questions or if we can be of service in any way.Marland KitchenMarland Kitchen

## 2017-11-08 ENCOUNTER — Telehealth: Payer: Self-pay | Admitting: Pulmonary Disease

## 2017-11-08 MED ORDER — METAXALONE 800 MG PO TABS: 800.0000 mg | ORAL_TABLET | Freq: Three times a day (TID) | ORAL | 1 refills | Status: DC

## 2017-11-08 NOTE — Telephone Encounter (Signed)
Spoke with pt. States that he is still having issues with pain in his side. He would like to be seen by SN. Advised him that SN was not seeing pt's this week. Pt states that he will go to an urgent care to be checked before the holiday. Nothing further was needed.

## 2017-11-08 NOTE — Telephone Encounter (Signed)
Spoke with pt. States that he is wanting a new prescription for Metaxalone 800mg . Reports that SN has given this to him in past.  SN - please advise on refill. Thanks.

## 2017-11-08 NOTE — Telephone Encounter (Signed)
ATC pt, no answer. Left message for pt to call back.  Need to clarify medication.

## 2017-11-08 NOTE — Telephone Encounter (Signed)
Pt returning call.  Please call cell number.-tr

## 2017-11-08 NOTE — Telephone Encounter (Signed)
Sent in Rx for Skelaxin to [facility] on [address]. Pt notified and nothing further is needed.

## 2017-12-07 ENCOUNTER — Encounter: Payer: Self-pay | Admitting: Pulmonary Disease

## 2017-12-07 ENCOUNTER — Ambulatory Visit (INDEPENDENT_AMBULATORY_CARE_PROVIDER_SITE_OTHER): Payer: Medicare Other | Admitting: Pulmonary Disease

## 2017-12-07 VITALS — BP 126/62 | HR 62 | Temp 97.9°F | Ht 70.0 in | Wt 184.6 lb

## 2017-12-07 DIAGNOSIS — G8929 Other chronic pain: Secondary | ICD-10-CM

## 2017-12-07 DIAGNOSIS — D126 Benign neoplasm of colon, unspecified: Secondary | ICD-10-CM

## 2017-12-07 DIAGNOSIS — F411 Generalized anxiety disorder: Secondary | ICD-10-CM | POA: Diagnosis not present

## 2017-12-07 DIAGNOSIS — M545 Low back pain, unspecified: Secondary | ICD-10-CM

## 2017-12-07 DIAGNOSIS — G629 Polyneuropathy, unspecified: Secondary | ICD-10-CM

## 2017-12-07 DIAGNOSIS — K219 Gastro-esophageal reflux disease without esophagitis: Secondary | ICD-10-CM | POA: Diagnosis not present

## 2017-12-07 DIAGNOSIS — I780 Hereditary hemorrhagic telangiectasia: Secondary | ICD-10-CM

## 2017-12-07 DIAGNOSIS — M150 Primary generalized (osteo)arthritis: Secondary | ICD-10-CM

## 2017-12-07 DIAGNOSIS — E7800 Pure hypercholesterolemia, unspecified: Secondary | ICD-10-CM | POA: Diagnosis not present

## 2017-12-07 DIAGNOSIS — J3489 Other specified disorders of nose and nasal sinuses: Secondary | ICD-10-CM | POA: Diagnosis not present

## 2017-12-07 DIAGNOSIS — I483 Typical atrial flutter: Secondary | ICD-10-CM

## 2017-12-07 DIAGNOSIS — M159 Polyosteoarthritis, unspecified: Secondary | ICD-10-CM

## 2017-12-07 DIAGNOSIS — W19XXXD Unspecified fall, subsequent encounter: Secondary | ICD-10-CM

## 2017-12-07 DIAGNOSIS — G060 Intracranial abscess and granuloma: Secondary | ICD-10-CM | POA: Diagnosis not present

## 2017-12-07 MED ORDER — HYDROCORTISONE 2.5 % RE CREA: TOPICAL_CREAM | RECTAL | 1 refills | Status: DC

## 2017-12-07 MED ORDER — SILODOSIN 8 MG PO CAPS: 8.0000 mg | ORAL_CAPSULE | Freq: Every day | ORAL | 3 refills | Status: DC

## 2017-12-07 MED ORDER — MECLIZINE HCL 25 MG PO TABS: 25.0000 mg | ORAL_TABLET | Freq: Every day | ORAL | 5 refills | Status: DC | PRN

## 2017-12-07 MED ORDER — CILIDINIUM-CHLORDIAZEPOXIDE 2.5-5 MG PO CAPS: 1.0000 | ORAL_CAPSULE | Freq: Three times a day (TID) | ORAL | 5 refills | Status: DC | PRN

## 2017-12-07 MED ORDER — MONTELUKAST SODIUM 10 MG PO TABS: 10.0000 mg | ORAL_TABLET | Freq: Every day | ORAL | 5 refills | Status: DC

## 2017-12-07 NOTE — Patient Instructions (Signed)
Today we updated your med list in our EPIC system...    Continue your current medications the same...  We will fix the dose of Toprol-XL in your med list to read 1/2 tab daily...  We wrote for some ANUSOL-HC cream to apply as discussed...  Call for any questions...  Let's plan a follow up visit in April '19, sooner if needed for problems.Marland KitchenMarland Kitchen

## 2018-02-20 ENCOUNTER — Other Ambulatory Visit: Payer: Self-pay | Admitting: Pulmonary Disease

## 2018-02-20 NOTE — Telephone Encounter (Signed)
[facility] requesting refill for Clorazepate 7.5mg , # 90, Take 1 tab three times per day, as needed. Last office visit was 12/07/17.  Next office visit 04/17/18.  Last refill was 12/07/17.  SN please advise.

## 2018-02-22 NOTE — Telephone Encounter (Signed)
Per SN- ok to refill Clorazepate 7.5mg , take 1 tablet 3 times a day as needed,#90, with 5 refills.  Called in at [place], [place], [place]. Nothing further needed.

## 2018-04-10 ENCOUNTER — Ambulatory Visit: Payer: Medicare Other | Admitting: Pulmonary Disease

## 2018-04-16 NOTE — Progress Notes (Signed)
Subjective:    Patient ID: Gregory [name], male    DOB: 06-25-1941, 77 y.o.   MRN: [id number]  HPI  77 y/o WM here for a follow up visit... he has multiple medical problems including:  Hx OWR syndrome;  Hx atypCP;  Hyperchol;  Hypothyroidism;  GERD/ Divertics/ IBS/ Colon polyps;  BPH/ BOO;  DJD/ LBP/ Plantar fasciitis;  Hx dizziness;  Anxiety & chronic persistant insomnia... 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  LABS 10/14:  FLP- on diet alone looks pretty good w/ LDL=111...  EKG 10/14 showed SBrady, rate57, otherw wnl;  2DEcho 10/14 showed normal LV size & wall thickness, norm LVF w/ EF=55-60%, Gr1DD, mildly thickened AoV leaflets, mild MR;  ETT 11/14 showed 82mn on treadmill, hypertensive BP response, rare PVC, no ischemia, O2 sats dropped to 87% w/ exercise...  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 Given this mild airflow obstruction we will start Rx w/ Dulera100- 2spBid   CXR 3/15 showed normal heart size, clear lungs, mild apical scarring, right basilar density c/w AVM based on CT report 2004, NAD..Marland KitchenMarland Kitchen EKG 3/15 showed NSR, rate87, low volt, NAD...  ADDENDUM> LABS 4/15 showed>  FLP- TChol 137, TG 30, HDL 36, LDL 95;  Chems- wnl;  CBC- anemic w/ Hg=11.3 & MCV=77;  TSH=9.26...   Ret for Iron panel (Fe=16- 4%sat), Ferritin (6.4), Stool cards (pending); then start FeSO4-3224mID w/ VitC500... Recheck CBC, Iron level in 6-8weeks...   Taking Synthroid112 regularly, therefore increase back to 12556md... Recheck TSH in 4839mo68moe never returned.  LABS 10/15:  CBC- wnl w/ Hg= 16.5, Fe=88 (23%sat),  Ferritin=36;  TSH=5.10... rec to continue Fe supplement daily...  CXR 4/16 showed norm heart size, clear lungs w/ some hyperinflation, RLL AVM noted w/o change...  EKG 4/16 revealed poor tracing- NSR, rate68, minor NSSTTWA, NAD...   LABS 4/16:  FLP- at goals on diet alone;  Chems- wnl;  CBC- wnl;  TSH=4.38...   ~  June 04, 2015:  839mo 6839mo& post hosp check>  [name] wMikki Santeehosp 5/24 - 05/21/15 by [facility] after presenting w/ HA & right leg weakness; he tells me he saw his dentist for routine cleaning 2wks prior to this; eval revealed a brain lesion in left basal ganglionic region (tumor vs abscess); he had brain stereotactic bx 5/27 by DrNundkumar (left frontal craniectomy) & thick pus was removed- cultures +peptostreptococcus species (otherw neg aerobic bact cult, AFB, Fungi); he was seen by ID & placed on Rochepin, Vanco, Flagyl to be continued 39mo p94modisch (they are checking levels and adjusting dose); also started on Keppra500Bid per NS;  Since disch he is feeling better- no f/c/s, notes sl dry cough/ no sput, & the cough is keeping him awake- TUSSIONEX called in & helps he says;  Sl nausea relieved by phenergan prn...  EXAM reveals Afeb, VSS, O2sat=94% on RA at rest;  HEENT- OWR telangiectasias w/o change;  Chest- few basilar crackles w/o wheezing, rhonchi, consolidation;  Heart- RR w/o m/r/g;  Abd- neg;  Ext-  neg w/o c/c/e...  CT Head 5/16 showed 2 x 3 cm LEFT basal ganglia mass, likely tumor vs abscess, moderately extensive surrounding edema, small surgical clips within or near the posterior wall LEFT maxillary sinus/LEFT pterygopalatine fossa, in this patient with history of nose bleed (these are stable from prior scans)...  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   EKG 5/16 showed NSR, rate68, wnl, NAD.Marland KitchenMarland Kitchen  Ven Dopplers 6/16 were neg for DVT  LABS- reviewed> only pos cult= peptostreptococcus from brain bx;  Chems- ok x Na & Ca sl low;  CBC- ok x WBC 14=>11... 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 Given IS, refill Phenergan & Tussionex...  ~  July 31, 2015:  23moOV & add-on appt requested for several complaints>  [name] finished his antibiotic Rx for brain abscess (likely due to dental work several weeks prior to onset of neuro symptoms in [id number] DrCampbell has arranged for a follow up CT Brain 08/11/15 (this will be 3 wks off antibiotics);  He had an episode of tachycardia (?AFlutter w/ rvr per EMS) w/ HR incr to 170's- went to ER 7/13 (but was in NSR) & he is currently wearing a Holter monitor per DrCrenshaw w/ follow-up Cards appt sched for next week...        He presents today w/ several complaints>  1) hoarseness: note this is intermittent, off & on, swallowing OK, denies indigestion/ reflux/ etc; he is on Protonix40 w/ his hx of OWR, plus zofran/ librax; we reviewed antireflux regimen & agreed to refer him to ENT for a look at his larynx & their opinion...Marland KitchenMarland Kitchen2) c/o numbness in toes and bottom of his feet bilat for the last week or so w/ some discomfort while walking; no culprit meds on his list & we offered referral to Neuro for a neuropathy evaluation...Marland KitchenMarland Kitchen3) he is c/o weight loss> weight today is 167#, 5'10" Tall, BMI=24 which is wnl; last wt=182# 58m75moo for a 15# wt loss, appetite is good & intake unchanged  he says; we discussed caloric intake and nutritional supplements; we also checked labs> Chems- wnl, BS=103, A1c=5.7;  CBC- wnl; Note- he is on Synthroid125m3m and TSH= 3.28 when checked 07/03/15...   CXR 07/02/15 showed norm heart size, clear lungs, NAD...   CT Head 08/11/15 showed left frontal craniotomy, resolved abscess in left external capsule w/ decr edema and decr enhancement...  2DEcho 08/14/15 showed norm LV size & function w/ EF=55-60%, no regional wall motion abn, Gr2DD, trace MR & TR  ~  October 02, 2015:  58mo 47mo& [name] hMikki Santeegained 4# up to #171# today on Ensure 1can/d:  We discussed the need for AMOXICILLIN 2gm 1H prior to dentist...    He saw ENT (we do not have note) & pt indicates that his cords were sl thinned, they offered injection rx but  his hoarseness is improved & holding off...     He saw DrCampbell, ID on 8/1> f/u anaerobic brain abscess after dental work & presented w/ right sided weakness (resolved); he finished 68moof Levaquin & Flagyl, no f/c/s, neuro exam was WNL, & f/u scan 8/22 w/ resolved abscess; pt tells me DrCampbell signed off...     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 Overall, these findings are severe in degree electrically with respect to sensory responses. 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  We reviewed prob list, meds, xrays and labs> NOTE: THEY WANT ANOTHER CT HEAD IN [id number]..  ~  January 01, 2016:  345moOV & BoMikki Santees stable- persistent neuropathy symptoms (?related to Flagyl Rx?), c/o cold hands, speech difficulty (tongue feels bigger), ?memory problem- offered speech path assessment but he notes Neuro f/u appt in several weeks & wants to wait;  Hoarseness improved w/ MMW;  DrCampbell, ID has signed off- pt & wife want another CT Head to f/u the prev brain abscess & we will order;  He knows to take AMOX prior to any procedures (due to his OWR)... No other interval visits in the last 57m27moe requests mult med refills- ok...  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    Hx Cough, mild COPD, AVM in RLL area w/ some hypoxemia during exercise> prev on Dulera100-2spBid, now off; breathing improved w/ exercise; O2 sat= 91% on RA today; c/o dry throat & encouraged to use lozenges etc...    Hx CP, episode of PAFlutter> w/u by DrCHilary Hertzs neg, no recurrent CP or arrhythmia- he remains active but notes DOE w/ stairs, ok on level ground, 2DEcho & GXT were OK x O2 sat drop to 87% at peak exercise- ?incr shunting thru his RLL AVM...    Chol> Intol to Simva40 & Cres5 due to musc cramps; FLP 4/16 on diet alone showed TChol 146, TG 85, HDL 35, LDL 94...    Hypothy>  On Synthroid 125m68m now>  Labs 7/16 revealed TSH= 3.28 & clinically euthyroid, continue same...    GI- GERD, IBS, Polyps> on Protonix40Qd & Zofran/ Librax prn; [facility]> note reviewed (heme pos stool & anemia from epistaxis & NSAIDs); he had colonoscopy 3/12- neg x for hems & DrM rec f/u 39yrs30yr   GU>  Hx BPH w/ BOO on Rapaflo 8mg/d58mr  DrEskridge; last seen 1/16 & doing satis & pt reports that his PSA was OK...    Ortho> on OTC meds prn; eval 12/11 by DrNorris for bilat leg pain & cramping, ?min atrophy of  left gastroc- he rec tonic water & neurology eval> he saw DrWillis 3/12 (note reviewed)> NCV was normal; EMG c/w mild S1 radiculopathy; he rec MRI- pt decided against the MRI;  He reports some discomfort in knees, hips, back & we decided on trial of Tramadol50...    Anxiety/ Insomnia>  He has Chlorazepate7.5 for Prn use, and Ambien10 to use for sleep Qhs... EXAM reveals Afeb, VSS, O2sat=91% on RA at rest;  Wt is up 8# to 179#; HEENT- OWR telangiectasias w/o change;  Chest- few basilar crackles w/o wheezing, rhonchi, consolidation;  Heart- RR w/o m/r/g;  Abd- neg;  Ext- neg w/o c/c/e...  LABS 1/17>  BMet- wnl  CT Brain 1/17> pending IMP/PLAN>>  Gregory [name] has mult somatic complaints lingering & he is still quite anxious; he wants another CT scan & we will order this; he is encouraged to continue his regular f/u appts w/ his specialists & we will recheck in 62month..  ADDENDUM>>  CT Brain1/17/17 w/ contrast showed no acute changes, area of hypoattenuation in left ext capsule & lentiform nucleus extending to the white matter of the centrum semiovale is improved from 07/2015, no abn post contrast enhacement- residual infection is unlikely... He will decide if he wants to repeat the scan in ~679mo.  ~  June 07, 2016:  66m77moV & pulmonary/medical follow up>  BobMikki Santees c/o persistent neuropathy symptoms and right upper lip is numb, otherw feeling well/ energy good/ no f/c/s, no cough/ no sput "except at night" he says;  Notes that he'll wake 1-2 x per week & wakes him up => we discussed reflux & a vigorous antireflux regimen (PPI before dinner, NPO after dinner, elev HOB)... We reviewed the following medical problems during today's office visit >>     OWR>  He has Osler-Weber-Rendu syndrome w/ mult telangiectasias (skin, lips, nasal w/ prev nose  bleeds- mult surg & occluded nares, AVM in RLL on CXR w/o hemoptysis);  Stable- doing satis w/o recurrent bleeding from nose...    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    Hx CP, episode of PAFlutter> w/u by DrCHilary Hertzs neg, no recurrent CP or arrhythmia- he remains active but notes DOE w/ stairs, ok on level ground, 2DEcho & GXT were OK x O2 sat dropped to 87% at peak exercise- ?incr shunting thru his RLL AVM... Marland KitchenMarland Kitchenen by Cards 2/17> OWR, AFlutter, HL; adm w/ brain abscess 5/16 (predisposed by his OWR?); on ToprolXL25, tol well, no recurrent arrhythmia...     Chol> Intol to Simva40 & Cres5 due to musc cramps; FLP 6/17 on diet alone showed TChol 155, TG 60, HDL 42, LDL 101...    Hypothy>  On Synthroid 1266m57m now>  Labs 6/17 revealed TSH= 2.41 & clinically euthyroid, continue same...    GI- GERD, IBS, Polyps> on Protonix40Qd & Zofran/ Librax prn; [facility]> note reviewed (heme pos stool & anemia from epistaxis & NSAIDs); he had colonoscopy 3/17 w/ divertics, hems, no polyps...    GU>  Hx BPH w/ BOO prev on Rapaflo 8mg/39mer DrEskridge; urodynamics showed small capacity hypersens bladder- note 12/2015 reviewed...    Ortho/ Neuro- neuropathy, hx brain abscess> on OTC meds prn; eval 12/11 by DrNorris for bilat leg pain & cramping, ?min atrophy of left gastroc- he rec tonic water &  neurology eval> he saw DrWillis 3/12 (note reviewed)> NCV was normal; EMG c/w mild S1 radiculopathy; he rec MRI- pt decided against the MRI;  He reports some discomfort in knees, hips, back & we decided on trial of Tramadol50... He saw DrPatel 12/2015- hx brain abscess 5/16, followed by neuropathy symptoms, and neuro notes reviewed...     Anxiety/ Insomnia>  He has Chlorazepate7.5 for Prn use, and Ambien10 to use for sleep Qhs... EXAM  reveals Afeb, VSS, O2sat=91% on RA at rest;  Wt is up 8# to 179#; HEENT- OWR telangiectasias w/o change;  Chest- few basilar crackles w/o wheezing, rhonchi, consolidation;  Heart- RR w/o m/r/g;  Abd- neg;  Ext- neg w/o c/c/e...  LABS 06/09/16>  FLP- all parameters at goals on diet alone;  Chems- wnl;  CBC- wnl;  TSH=2.41;  PSA=1.58 IMP/PLAN>>  We reviewed his reflux symptoms and advised a vigorous antireflux regimen w/ Protonix40 before dinner, NPO after dinner, Elev HOB; otherw same meds 7 [name] in 67mosooner prn...  ~  December 07, 2016:  6724moOV & [name] reports doing well- no new complaints or concerns "I feel good"; he does note some right leg numbness but notes that his upper lip is better and his toe neuropathy is better...     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    He has Hx CP & PAFlutter> followed by DrHilary Hertz stable on MetoprololER25/d...    [facility] & note reviewed- OWR, GERD, divertics, colon adenoma; prev on Protonix40 but concern for memory [facility] He had EGD 09/2016 w/ gastric & duodenal AVMs noted...    He had Neuro f/u DrPatel 08/25/16> hx brain abscess (occurred after dental work), bilat feet paresthesias (thought to be due to Flagyl), intol to Gabapentin w/ GI side effects, PT helped; noted recent vision issues...     He had Urology appt w/ [facility]equ[facility]- 07/26/16> prev followed by DrEskridge, c/o freq/ urgency/ nocturia, improved on Myrbetriq50 EXAM reveals Afeb, VSS, O2sat=94% on RA at rest;  Wt is stable at 183#; HEENT- OWR telangiectasias w/o change;  Chest- few basilar crackles w/o wheezing, rhonchi, consolidation;  Heart- RR w/o m/r/g;  Abd- neg;  Ext- neg w/o c/c/e... IMP/PLAN>>  BoMikki Santees stable & doing reasonably well; he says DrMedoff stopped his Protonix because he was on it for yrs and over concern for  dementia & switched him to [employer] We gave him the Prevnar-13 vaccine today & now he is up-to-date...  ~  June 07, 2017:  24m45moV & [name] reports that he's had some signif right shoulder pain- seen by Ortho but we do not have notes from them, states he was given a musc relaxer (no NSAIDs) & getting PT;  He also reports a f/u w/ allergy- DrSharma for "cough" and given Singulair10/ Xyzal5 which has helped... We reviewed the following medical problems during today's office visit >>     S/p bilat cataract surg DrBevis 2018    OWR>  He has Osler-Weber-Rendu syndrome w/ mult telangiectasias (skin, lips, nasal w/ prev nose bleeds- mult surg & occluded nares, AVM in RLL on CXR w/o hemoptysis, & upper GI tract);  Stable overall w/ only intermittent epistaxis, denies GI bleeding, etc... He is again anemic & we'll need to re-evaluate & rx his iron decifiency...    AR>  followed by DrSharma & recently started on Singulair10 + Xyzal5 w/ improvement in symptoms...    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    Hx CP, episode of PAFlutter> w/u by Hilary Hertz was neg, no recurrent CP or arrhythmia- he remains active but notes DOE w/ stairs, ok on level ground, 2DEcho & GXT were OK x O2 sat dropped to 87% at peak exercise- ?incr shunting thru his RLL AVM.Marland KitchenMarland Kitchen Seen by Cards 2/18> OWR, AFlutter, HL; adm w/ brain abscess 5/16 (predisposed by his OWR?); on ToprolXL25, tol well, no recurrent arrhythmia...     Chol> Intol to Simva40 & Cres5 due to musc cramps; FLP 6/18 on diet alone showed TChol 123, TG 47, HDL 42, LDL 72...    Hypothy>  On Synthroid 158mg/d now>  Labs 6/18 revealed TSH= 1.37 & clinically euthyroid, continue same...    GI- GERD, IBS, Polyps> on Protonix40Qd & Zofran/ Librax prn; [facility]> note reviewed (heme pos stool &  anemia from epistaxis & NSAIDs); he had colonoscopy 3/17 w/ divertics, hems, no polyps...    GU>  Hx BPH w/ BOO on Rapaflo 813md per DrEskridge; urodynamics showed small capacity hypersens bladder- note 12/2015 reviewed; now followed by DrCarmie Endseen 2/18 & note reviewed, frequency & nocturia, Myrbetriq helped but too $$, back on Rapaflo8...    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 He saw DrPatel for f/u 12/2016- hx brain abscess 5/16, followed for neuropathy symptoms, and neuro notes reviewed=> placed on Baclofen for musc spasticity...    Anxiety/ Insomnia>  He has Chlorazepate7.5 for Prn use...    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 EXAM reveals Afeb, VSS, O2sat=93% on RA at rest;  Wt is stable ~185#; HEENT- OWR telangiectasias w/o change & nasal obstruction  Chest- few basilar crackles w/o wheezing, rhonchi, consolidation;  Heart- RR w/o m/r/g;  Abd- neg;  Ext- neg w/o c/c/e...  LABS 06/07/17>  FLP- all parameters at goals on diet alone now;  Chems- wnl;  CBC- anemic w/ Hg-9.4, mcv=66;  TSH=1.37;  PSA=1.82...  Pt asked to return for anemia eval w/ repeat CBC  Fe  Ferritin  B12  SPE/IEP - all pending... 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  ADDENDUM>>  Additional labs confirm IDA w/ Hg=9.0, mcv=66, Fe=5 (1.1%sat), B12=817, SPE w/ low TProt/Alb & no M-spike... Rec to start FeSO4 325mg34m w/ VitC  and rec to incr protein intake daily... We plan recheck CMet & CBC/ iron panel in 46month..  ADDENDUM>>  LABS 07/11/17 showed CMet- wnl;  CBC- much improved w/ Hg=13.4, mcv=80;  Iron=137 (37%sat)... REC to decr FeSO4 to one daily w/ VitC & repeat CBC, Iron panel in 364mobefore halloween)... ADDENDUM>>  Labs 10/10/17 showed Hg=16.1 & Fe is good at 113 w/ 34% saturation;  REC to decr the FeSO4 + VitD to 3x per week on MWF & stay on this...  ~  November 02, 2017:  65m63moV & add-on appt requested after fall>  BobMikki Santeeports that he fell ~1wk ago (tripped in his garage); he hit the floor w/ his left hip/ buttock- c/o pain in left flank region (sore, tender, can't sleep);  There is no bruising and no bowel or bladder problems/ changes;  He denies cough, sput, hemoptysis, or trouble breathing; we discussed checking XRay of left lower ribs (neg), and treatment w/ rest/ heat/ Tramadol + Tylenol prn...     EXAM reveals Afeb, VSS, O2sat=92% on RA at rest;  Wt is stable ~186#; HEENT- OWR telangiectasias w/o change & nasal obstruction  Chest- few basilar crackles w/o wheezing, rhonchi, consolidation;  There is a sl bruise on his left flank area & it's tender to palp;  Heart- RR w/o m/r/g;  Abd- neg;  Ext- neg w/o c/c/e...  Left Rib Detail films 11/02/17>  No rib fx identified, lung is clear & wnl... IMP/PLAN>>  We discussed Rx w/ REST (rib binder if nec), HEAT, Tramadol50 + Tylenol500 as needed for pain;  He will call for any concerns going forward;  He is continuing w/ balance exercises etc...    ~  December 07, 2017:  22mo67mo & [name] reports feeling well now- left flank area/ lower rib pain has resolved w/ Rx & time;   His only other complaint today is some rectal discomfort & we discussed Rx w/ Anusol HC cream.    He remains on Xyzal, Singulair, and knows to use antibiotics prior to dental work (hx brain abscess 2016); his breathing is good- cough improved, no sput or blood...    He is taking ToprolXL25- 1/2 tab daily & BP=126/62 without HA, visual symptoms, CP, palpit, SOB, edema...    He continues Protonix & Librax, and he denies abd pain, n/v, c/d, blood seen...    Stools remain dark w/ Fe + VitC Q-mwf, labs 10/18 were back to normal... EXAM reveals Afeb, VSS, O2sat=91% on RA at rest;  Wt is stable ~189#; HEENT- OWR telangiectasias w/o change & nasal obstruction  Chest- few basilar crackles w/o wheezing, rhonchi, consolidation;  Heart- RR w/o m/r/g;  Abd- neg;  Ext- neg w/o c/c/e... IMP/PLAN>>  [name] Gregory Santeeback to baseline now- left flank area/ lower rib pain has resolved;  Only complaint today is some rectal discomfort & we prescribed AnusolHC cream- he will call if not responding;             Problem List:   BRAIN ABSCESS  >>  [name] Gregory [name] hosp 5/24 - 05/21/15 by [facility] after presenting w/ HA & right leg weakness; he tells me he saw his dentist for routine cleaning 2wks prior to this; eval revealed a brain lesion in left basal ganglionic region (tumor vs abscess); he had brain stereotactic bx 5/27 by DrNundkumar (left frontal craniectomy) & thick pus was removed- cultures +peptostreptococcus species (otherw neg aerobic bact cult, AFB, Fungi); he was seen by ID & placed on Rochepin, Vanco, Flagyl  to be continued 46mopost disch (they are checking levels and adjusting dose); also started on Keppra500Bid per NS... ~  6/16:  Since disch he is feeling better- no f/c/s, notes sl dry cough/ no sput, & the cough is keeping him awake- TUSSIONEX called in & helps he says;  Sl nausea relieved by phenergan prn...  ~  8/16:  He was checked by DrCampbell, ID- they stopped antibiotics and rec f/u CT Brain 08/11/15= 3 wks off the antibiotic  rx => resolved abscess. ~  1/17:  CT Brain - pending  OTHER DISEASES OF NASAL CAVITY AND SINUSES - Hx of Osler-Weber-Rendue syndrome/ hereditary telangiectasias... he's had numerous nose bleeds and surgery by DEtter Sjogren now followed by DBarbaraann Faster.. left nares is occluded w/ skin graft- no lumen... this is quite uncomfortable to the patient but he's been told there is nothing further that can be done for this... ~  3/11:  notes occas nosebleeds but he is able to handle them... ~  4/12:  No diff in his pattern of mild nose bleeds & he denies any severe epistaxis episodes... 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 ~  4/14:  He notes infreq bleeding now w/ saline etc... 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 Dyspnea w/ O2 desat w/ exercise >>  ~  12/14:  PFTs showed FVC=3.98 (89%), FEV1=2.72 (79%), %1sec=68, and mid-flows= 51% predicted; c/w mild obstructive dis & we decided on a trial of Dulera100-2spBid ~  4/15:  His breathing is [facility]/ [facility]... ~  10/15:  His breathing is back to baseline after FeSO4 rx for anemia & ret of Hg to normal; [facility] has it handy for prn use... ~  4/16:  prev on Dulera100-2spBid, now just prn; breathing improved w/ exercise; O2 sat= 93% on RA today; c/o dry throat & encouraged to use lozenges etc... ~  CXR 4/16 showed norm heart size,  clear lungs w/ some hyperinflation, RLL AVM noted w/o change... 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 known GI telangiectasia as well without signif GI bleeding in the past- [facility] he's also has skin telangiectasis [facility]/ [facility].. extensive nasal problems as above... ~  labs 2/09 showed Hg= 17.0 ~  labs 2/10 showed Hg= 16.4 ~  CXR 3/11 is chr incr markings esp RLL- no change, NAD...  ~  Labs 6/11 showed Hg= 15.9 ~  Labs 4/12 showed Hg= 16.0 ~  CXR 10/12 showed  mild biapical scarring, RLL AVM, NAD... ~  Labs 4/13 showed Hg= 15.2 ~  CXR 8/13 showed heart at upper lim of norm, increased markings & apical scarring, chr incr markings in RLL- AVM, NAD.[name]. 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 ~  CXR 3/15 showed normal heart size, clear lungs, mild apical scarring, right basilar density c/w AVM based on CT report 2004, NAD.[name]. ~  CXR 4/16 showed norm heart size, clear lungs w/ some hyperinflation, RLL AVM noted w/o change... 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  Hx of CHEST PAIN (ICD-786.50) - seen Sep09 w/ several recent bouts of CP while working in yard, washing car, etc... resolved w/ rest & Protonix... EKG showed WNL, prev [facility]St[facility]Test 1/04 at [facility] was neg (no ischemia or infarction and EF=  61%)... Cardiac eval DrCrenshaw w/ 2DEcho 10/09 showing norm LV- no regional wall motion abn, norm LVF w/ EF= 60%, mild dil RA/ RV;  and norm MYOVIEW (no ischemia or infarction, EF= 58%)... ~  10/14: repeat cardiac eval by DrCrenshaw due to [place] w/ stairs; EKG 10/14 showed SBrady, rate57, otherw wnl;  2DEcho 10/14 showed normal LV size & wall thickness, norm LVF w/ EF=55-60%, Gr1DD, mildly thickened AoV leaflets, mild MR;  ETT 11/14 showed 59mn on treadmill, hypertensive BP response, rare PVC, no ischemia, O2 sats dropped to 87% w/ exercise ~  EKG 3/15 showed NSR, rate87, low volt, NAD..Marland KitchenMarland Kitchen  Episode of AFLUTTER >> this occurred 06/2015 & notes from Cards ([facility] reviewed); placed on lose dose TOPROL-XL & titrated up to 282md... Holter monitor placed and results pending...   HYPERCHOLESTEROLEMIA, MILD (ICD-272.0) - now on CRESTOR 68m80m>  Prev Rx w/ Simva40 but developed muscle cramping which resolved off the med. ~  FLP 6/08 showed TChol 208, TG 75, HDL 34, LDL 139... he preferred diet Rx- ~  FLP 2/09 showed TChol 172, TG 69, HDL 30, LDL 129... [place] ~  [place]09 on Crestor10 showed TChol 91, TG 49, HDL 28, LDL 54... rec- keep same. ~  FLP 2/10 on Crestor10 showed TChol 108, TG 56, HDL 31, LDL 66 ~  9/10:  we discussed changing Crestor10 to Simvastatin40 for $$$ reasons... ~  FLP[place]11 on Simva40 showed TChol 105, TG 59, HDL 39, LDL 54... continue same. ~  [place] on Simva40 showed TChol 104, TG 62, HDL 29, LDL 63... C/o severe muscle cramps & wants to stop for 71mo571moamping resolved! ~  Referred to [facility] & they restarted his Crestor at 68mg/98mtol well so far... ~  FLP 10/12 on Cres5 showed TChol 114, TG 40, HDL 36, LDL 70 ~  FLP 2/13 on Cres5 showed TChol 124, TG 21, HDL 40, LDL 80 ~  FLP 4/14 on Cres5 showed TChol 119, TG 44, HDL 38, LDL 72  ~  6/14: pt stopped Cres5 on his own due to leg cramps & decided to hold statins for now w/ repeat FLP off meds  in several months... ~  FLP 10/14 on diet alone showed TChol 155, TG 29, HDL 38, LDL 111  ~  FLP 4/15 on  diet alone showed TChol 137, TG 30, HDL 36, LDL 95 ~  FLP 4/16 on diet alone showed TChol 146, TG 85, HDL 35, LDL 94  HYPOTHYROIDISM - on SYNTHROID 131mg/d now; prev 1222m/d dose was decreased 4/12... ~  labs 2/09 showed TSH= 0.69...  8/09 showed TSH= 0.44... ~  labs 2/10 showed TSH= 0.68 ~  labs 3/11 showed TSH= 0.52 ~  Labs 4/12 on Synthroid125 showed TSH= 0.33... We decided to decr the dose to 112/d. ~  Labs 4/13 on Levo112 showed TSH= 4.60 ~  Labs 4/14 on Levo112 showed TSH= 5.46 ~  Labs 4/15 on Levo112 showed TSH= 9.26 and we decided to incr dose back to 12559md... ~  Labs 4/16 on Levo125 showed TSH= 4.38  GERD, IRRITABLE BOWEL SYNDROME, COLONIC POLYPS - [facility] (he tried OTC Zantac 12m67m his own)... also takes  LIBRHunt ~  colonoscopy 1/04 w/ divertics, diminutive rectal polyp (adenomatous), hems...  ~  f/u colon 1/07 showed no recurrent polyps... ~  [facility] HYPERTROPHY PROSTATE W/UR OBST & OTH LUTS (ICD-600.01) - followed by DrRDaBTDHRCBUAPAFLO 8mg/d62mtopped Flomax due to side effect- ED)... doing well without LTOS, just complains of nocturia x 1-2 ~  labs 2/09 showed PSA= 0.76 ~  labs 2/10 showed PSA= 0.90 ~  2011 PSA checked by DrDavis (pt states it was OK)... ~  Labs 4/12 showed PSA= 1.22 ~  Labs 4/13 showed PSA= 1.31 ~  10/13: presents c/o incr freq, nocturia x2-4, & weak stream despite Rapaflo; he is intol to Flomax; refer to Urology for further eval=> seen by DrEskridge, no changes made... ~  1/15: f/u by Urology, DrEskridge> BPH, LUTS, hypersens unstable bladder; on Rapaflo8 & doing satis, PSA reported to be 2.34...  LEFT ARM & SHOULDER PAIN >> he was checked by GboroOLacie Scottsot improving he says; [facility]> notes reviewed.  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 ~  9/10:  requesting Rx for Robaxin for muscle spasm as needed.  PLANTAR FASCIITIS (ICD-728.71) - c/o pain in his feet secondary to plantar fasciitis eval & [facility] & shots... ~  9/10: we discussed change to [facility].  Hx NOCTURNAL LEG CRAMPS >>  ~  This initially occurred several yrs ago while on Simvastatin & the cramps resolved off this med; he was subseq placed on Cres5 & tol well... ~  6/14: he indicates that noct leg cramps returned x1wk & he stopped the Cres5 w/ improvement; he wants to leave off the statins for now; rec to try tonic water/ yellow mustard prn...  Hx of DIZZINESS (ICD-780.4) - sudden episode dizziness Sep09 working at [employer] 1[employer] w/[employer] spinning- lasted 10min 35mresolved spontaneously, felt light headed afterwards... no LOC, syncope, seizure activity, etc... no CP/ palpit/ SOB/ etc... there were several EMTs in the restaurant and they checked him out- stable and transported to ER... 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 treaMarland Kitchened w/ MECLIZINE w/ improvement.  ANXIETY (ICD-300.00) & INSOMNIA, CHRONIC (ICD-307.42) - he uses CHLORAZEPATE 7.5mg Prn1mAMBIEN10mg/Qhs7m chronic persistant insomnia...  Hx of CARCINOMA, SKIN, SQUAMOUS CELL - one  removed from hand... 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  ANEMIA >>  ~  LABS 4/13 showed Hg= 15.2, MCV=91 ~  LABS 4/14 showed Hg= 14.5, MCV=84 ~  LABS 3/15 showed Hg= 13.6, MCV=78 ~  LABS 4/15 showed Hg= 11.3, MCV= 77, Iron studies showed Fe= 16 (3.6%sat), Ferritin= 6.4, & stool cards neg; Rx w/ FeSO4... ~  Labs 10/15 showed Hg= 16.5, MCV= 91, Fe=88 (23%sat), Ferritin= 36...  ~  Labs 4/16 showed  Hg= 16.0  Health Maintenance: ~  GI:  followed by [facility] & up to date on colon etc... (last 1/07 & 89yrf/u due 1/12). ~  GU:  followed by DrRDavis in past; PSAs have all been wnl... ~  Immunizations:  he gets yearly Flu vaccinations in the fall;  OK Pneumovax 03/12/10 at age-[id number]     Past Surgical History:  Procedure Laterality Date  . APPLICATION OF CRANIAL NAVIGATION N/A 05/16/2015   Procedure: APPLICATION OF CRANIAL NAVIGATION;  Surgeon: NConsuella Lose MD;  [facility];  Service: Neurosurgery;  Laterality: N/A;  . BRAIN BIOPSY Left 05/16/2015   Procedure: Stereotactic Left Brain Biopsy with Brain Lab;  Surgeon: NConsuella Lose MD;  [facility];  Service: Neurosurgery;  Laterality: Left;  Stereotactic Left Brain biopsy with brainlab  . CARDIOVASCULAR STRESS TEST  01-14-2003   NO ISCHEMIA / EF 61%/ NORMAL LE WALL MOTION  . EXCISION LEFT WRIST GANGLIAN/ MYXOID CYST  05-30-2009  . 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 OTHER SURGICAL HISTORY     pulse laser for facial telangiectasias inthe past  . removal of skin cancer from forehead  10/2012   Dr. HRenda Rolls . TRANSTHORACIC ECHOCARDIOGRAM  10-17-2008   DR CRENSHAW   NORMAL LVF/ EF 60%/  MILDLY DILATED RIGHT ATRIUM/ VENTRICULE  . VARICOCELECTOMY  1991   Dr. RTresa Endo   Outpatient Encounter Medications as of 12/07/2017  Medication Sig  . acetaminophen (TYLENOL) 325 MG tablet Take 650 mg by mouth daily as needed (pain).  .Marland Kitchenamoxicillin (AMOXIL) 500 MG capsule TAKE 4 CAPSULES 1 HOUR PRIOR TO PROCEDURE  . Cholecalciferol (VITAMIN D3) 2000 UNITS TABS Take 2,000 Units by mouth daily.  . clidinium-chlordiazePOXIDE (LIBRAX) 5-2.5 MG capsule Take 1 capsule by mouth 3 (three) times daily as needed.  . Diphenhyd-Hydrocort-Nystatin (FIRST-DUKES MOUTHWASH) SUSP 1 tsp gargle and swallow four times daily  . FERROUS SULFATE PO Take 65 mg by mouth 3 (three) times a week.  Mon, wed, Fri  . levocetirizine (XYZAL) 5 MG tablet Take 5 mg by mouth every evening.  .Marland Kitchenlevothyroxine (SYNTHROID, LEVOTHROID) 125 MCG tablet TAKE 1 TABLET BY MOUTH  DAILY BEFORE BREAKFAST  . Magnesium 250 MG TABS Take 250 mg by mouth at bedtime.   . meclizine (ANTIVERT) 25 MG tablet Take 1 tablet (25 mg total) by mouth daily as needed for dizziness.  . metaxalone (SKELAXIN) 800 MG tablet Take 1 tablet (800 mg total) by mouth 3 (three) times daily.  . metoprolol succinate (TOPROL-XL) 25 MG 24 hr tablet TAKE 1 TABLET BY MOUTH  DAILY (Patient taking differently: TAKE 0.5  TABLET BY MOUTH  DAILY)  . montelukast (SINGULAIR) 10 MG tablet Take 1 tablet (10 mg total) by mouth at bedtime.  . Multiple Vitamin (MULTIVITAMIN WITH MINERALS) TABS tablet Take 1 tablet by mouth daily. Centrum  . pantoprazole (PROTONIX) 40 MG tablet TAKE 1  TABLET BY MOUTH  DAILY  . silodosin (RAPAFLO) 8 MG CAPS capsule Take 1 capsule (8 mg total) by mouth at bedtime.  . traMADol (ULTRAM) 50 MG tablet Take 1 tablet (50 mg total) 3 (three) times daily by mouth.  . vitamin C (ASCORBIC ACID) 500 MG tablet Take 500 mg 3 (three) times a week by mouth.   . [DISCONTINUED] clidinium-chlordiazePOXIDE (LIBRAX) 5-2.5 MG capsule Take 1 capsule by mouth 3 (three) times daily as needed.  . [DISCONTINUED] clorazepate (TRANXENE) 7.5 MG tablet TAKE 1 TABLET BY MOUTH 3 TIMES A DAY AS NEEDED  . [DISCONTINUED] meclizine (ANTIVERT) 25 MG tablet Take 1 tablet (25 mg total) by mouth daily as needed for dizziness.  . [DISCONTINUED] montelukast (SINGULAIR) 10 MG tablet Take 10 mg by mouth at bedtime.  . [DISCONTINUED] silodosin (RAPAFLO) 8 MG CAPS capsule Take 1 capsule (8 mg total) by mouth at bedtime.  . hydrocortisone (ANUSOL-HC) 2.5 % rectal cream Use as directed.   No facility-administered encounter medications on file as of 12/07/2017.     Allergies  Allergen Reactions  . Nsaids Other (See Comments)    Cannot take-per MD  . Other Other (See  Comments)    All blood thinners-cannot take per MD  . Aspirin Other (See Comments)    nosebleeds  . Statins Other (See Comments)    Weakness, myalgias    Immunization History  Administered Date(s) Administered  . Influenza Split 09/21/2011, 09/27/2012  . Influenza Whole 09/11/2008, 09/11/2009, 09/22/2010  . Influenza, High Dose Seasonal PF 09/09/2016, 09/16/2017  . Influenza,inj,Quad PF,6+ Mos 10/04/2013, 10/08/2014, 10/02/2015  . Pneumococcal Conjugate-13 12/07/2016  . Pneumococcal Polysaccharide-23 03/12/2010  . Tdap 10/04/2013  . Varicella 12/20/2009     Current Medications, Allergies, Past Medical History, Past Surgical History, Family History, and Social History were reviewed in [employer] record.    Review of Systems       See HPI - other systems neg except as noted... The patient denies anorexia, fever, weight loss, weight gain, vision loss, decreased hearing, hoarseness, chest pain, syncope, dyspnea on exertion, peripheral edema, prolonged cough, headaches, hemoptysis, abdominal pain, melena, hematochezia, severe indigestion/heartburn, hematuria, incontinence, muscle weakness, suspicious skin lesions, transient blindness, difficulty walking, depression, unusual weight change, abnormal bleeding, enlarged lymph nodes, and angioedema.     Objective:   Physical Exam      WD, WN, 77 y/o WM in NAD... 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 GENERAL:  Alert & oriented; pleasant & cooperative... HEENT:  Ward/AT, EOM-wnl, PERRLA, EACs-clear, TMs-wnl, NOSE- occluded left nares from skin grafts, MOUTH- mucosal telangiectasias.  NECK:  Supple w/ fairROM; no JVD; normal carotid impulses w/o bruits; no thyromegaly or nodules palpated; no lymphadenopathy. CHEST:  Clear to P & A; without wheezes/ rales/ or rhonchi. HEART:  Regular Rhythm; without murmurs/ rubs/ or gallops. ABDOMEN:  Soft & nontender; normal bowel sounds; no organomegaly or masses  detected. EXT: without deformities, mild arthritic changes; no varicose veins/ venous insuffic/ or edema. NEURO:  CN's intact; motor testing normal; sensory testing normal; gait normal & balance OK. DERM:  mult telangiectasias...  RADIOLOGY DATA:  Reviewed in the EPIC EMR & discussed w/ the patient...  LABORATORY DATA:  Reviewed in the EPIC EMR & discussed w/ the patient...   Assessment & Plan:    10/23/17>  Pt tripped in his garage & fell on his left side w/ sl bruise & tenderness over the lower ribs; XRay NEG for Fx & we discussed Rx w/ REST (  rib binder if nec), HEAT, Tramadol50 + Tylenol500 as needed for pain;  He will call for any concerns going forward;  He is continuing w/ balance exercises etc...  12/07/17>   All symptoms resolved w/ time & rx    BRAIN ABSCESS >> [facility] 5/16, followed by ID, DrCampbell, finished antibiotic rx 07/2015 and f/u CT Brain=> resolved abscess... He will need antibiotic prophylaxis prior to future dental work...  Hx Cough, Dyspnea on exertion, mild COPD >> as above, prev on Dulera100- 2sp Bid & exercise program- improved; we will continue to monitor his status & O2 sats; Dyspnea improved w/ Fe for anemia & ret of Hg to norm... He has Tussionex prn cough... 8/16> c/o intermittent hoarseness & referred to ENT for eval of cords => we don't have note but pt indicates sl thinning of cords and they offered injections but he's holding off...  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     Hx CP, AFlutter>  Prev cardiac w/u by DrCrenshaw was neg, no recurrent CP complaints, but he notes DOE w/ stairs & DrCrenshaw  repeated 2DEcho & ETT as above... Episode of PAFlutter 06/2015=> holter monitor per cards- pending & pt improved on ToprolXL25...      Chol>  Off statins due to recurrent cramps, on diet alone & FLP is reasonable...     Hypothy>  On Synthroid 166mg/d now>  He is clinically & biochem euthyroid...     [facility]>  [facility]- neg x for hems & DrM rec f/u 54yr..     GU>  Hx BPH w/ BOO & followed by DrEskridge on Rapaflo 50m35m & stable...     Ortho>  See above- prev stable on OTC analgesics as needed, but recent incr symptoms- try Tramadol50 prn...  LEG CRAMPS >> he is quite sure they are from his statin Rx & we decided to leave these off for now, try to control on diet/ exercise & continued monitoring... 8/16> c/o numbness in toes & bottom of feet 8/16>  Refer to Neuro for neuropathy eval...     Anxiety/ Insomnia>  He has Chlorazepate for Prn use, and Ambien to use for sleep Qhs...  Anemia>  Prev on FeSO4 supplement & Hg now back to normal, dyspnea ret to baseline 7 he stopped the Fe... 06/07/17>   BobMikki [name] anemic again- likely the result of his OWR & intermittent mild nasal bleeding over the past yr; he has known upper GI telangiectasias & knows NOT to consume NSAIDs etc; he denies n/v/ hemetemesis and takes PPI regularly; he had colonoscopy 3/17 w/o recurrent polyps, +divertics and hems; options are oral iron replacement therapy vs IV Feraheme=> he prefers oral iron therapy therefore start FeSO4 325m10mw/ vitC500 & we will recheck CBC/ Fe in 6-8wks.   Patient's Medications  New Prescriptions   HYDROCORTISONE (ANUSOL-HC) 2.5 % RECTAL CREAM    Use as directed.  Previous Medications   ACETAMINOPHEN (TYLENOL) 325 MG TABLET    Take 650 mg by mouth daily as needed (pain).   AMOXICILLIN (AMOXIL) 500 MG CAPSULE    TAKE 4 CAPSULES 1  HOUR PRIOR TO PROCEDURE   CHOLECALCIFEROL (VITAMIN D3) 2000 UNITS TABS    Take 2,000 Units by mouth daily.   DIPHENHYD-HYDROCORT-NYSTATIN  (FIRST-DUKES MOUTHWASH) SUSP    1 tsp gargle and swallow four times daily   FERROUS SULFATE PO    Take 65 mg by mouth 3 (three) times a week. Mon, wed, Fri   LEVOCETIRIZINE (XYZAL) 5 MG TABLET    Take 5 mg by mouth every evening.   LEVOTHYROXINE (SYNTHROID, LEVOTHROID) 125 MCG TABLET    TAKE 1 TABLET BY MOUTH  DAILY BEFORE BREAKFAST   MAGNESIUM 250 MG TABS    Take 250 mg by mouth at bedtime.    METAXALONE (SKELAXIN) 800 MG TABLET    Take 1 tablet (800 mg total) by mouth 3 (three) times daily.   METOPROLOL SUCCINATE (TOPROL-XL) 25 MG 24 HR TABLET    TAKE 1 TABLET BY MOUTH  DAILY   MULTIPLE VITAMIN (MULTIVITAMIN WITH MINERALS) TABS TABLET    Take 1 tablet by mouth daily. Centrum   PANTOPRAZOLE (PROTONIX) 40 MG TABLET    TAKE 1 TABLET BY MOUTH  DAILY   TRAMADOL (ULTRAM) 50 MG TABLET    Take 1 tablet (50 mg total) 3 (three) times daily by mouth.   VITAMIN C (ASCORBIC ACID) 500 MG TABLET    Take 500 mg 3 (three) times a week by mouth.   Modified Medications   Modified Medication Previous Medication   CLIDINIUM-CHLORDIAZEPOXIDE (LIBRAX) 5-2.5 MG CAPSULE clidinium-chlordiazePOXIDE (LIBRAX) 5-2.5 MG capsule      Take 1 capsule by mouth 3 (three) times daily as needed.    Take 1 capsule by mouth 3 (three) times daily as needed.   CLORAZEPATE (TRANXENE) 7.5 MG TABLET clorazepate (TRANXENE) 7.5 MG tablet      TAKE 1 TABLET 3 TIMES A DAY AS NEEDED    TAKE 1 TABLET BY MOUTH 3 TIMES A DAY AS NEEDED   MECLIZINE (ANTIVERT) 25 MG TABLET meclizine (ANTIVERT) 25 MG tablet      Take 1 tablet (25 mg total) by mouth daily as needed for dizziness.    Take 1 tablet (25 mg total) by mouth daily as needed for dizziness.   MONTELUKAST (SINGULAIR) 10 MG TABLET montelukast (SINGULAIR) 10 MG tablet      Take 1 tablet (10 mg total) by mouth at bedtime.    Take 10 mg by mouth at bedtime.   SILODOSIN (RAPAFLO) 8 MG CAPS CAPSULE silodosin (RAPAFLO) 8 MG CAPS capsule      Take 1 capsule (8 mg total) by mouth at bedtime.    Take  1 capsule (8 mg total) by mouth at bedtime.  Discontinued Medications   No medications on file

## 2018-04-17 ENCOUNTER — Ambulatory Visit (INDEPENDENT_AMBULATORY_CARE_PROVIDER_SITE_OTHER)
Admission: RE | Admit: 2018-04-17 | Discharge: 2018-04-17 | Disposition: A | Discharge status: Home / Self Care | Payer: Medicare Other | Source: Ambulatory Visit | Attending: Pulmonary Disease | Admitting: Pulmonary Disease

## 2018-04-17 ENCOUNTER — Ambulatory Visit (INDEPENDENT_AMBULATORY_CARE_PROVIDER_SITE_OTHER): Payer: Medicare Other | Admitting: Pulmonary Disease

## 2018-04-17 ENCOUNTER — Encounter: Payer: Self-pay | Admitting: Pulmonary Disease

## 2018-04-17 VITALS — BP 120/62 | HR 73 | Temp 97.5°F | Ht 70.0 in | Wt 186.8 lb

## 2018-04-17 DIAGNOSIS — I483 Typical atrial flutter: Secondary | ICD-10-CM

## 2018-04-17 DIAGNOSIS — E7800 Pure hypercholesterolemia, unspecified: Secondary | ICD-10-CM

## 2018-04-17 DIAGNOSIS — K219 Gastro-esophageal reflux disease without esophagitis: Secondary | ICD-10-CM

## 2018-04-17 DIAGNOSIS — G8929 Other chronic pain: Secondary | ICD-10-CM

## 2018-04-17 DIAGNOSIS — G629 Polyneuropathy, unspecified: Secondary | ICD-10-CM

## 2018-04-17 DIAGNOSIS — F411 Generalized anxiety disorder: Secondary | ICD-10-CM

## 2018-04-17 DIAGNOSIS — I780 Hereditary hemorrhagic telangiectasia: Secondary | ICD-10-CM

## 2018-04-17 DIAGNOSIS — M545 Low back pain: Secondary | ICD-10-CM

## 2018-04-17 DIAGNOSIS — J3489 Other specified disorders of nose and nasal sinuses: Secondary | ICD-10-CM

## 2018-04-17 DIAGNOSIS — D126 Benign neoplasm of colon, unspecified: Secondary | ICD-10-CM

## 2018-04-17 DIAGNOSIS — M150 Primary generalized (osteo)arthritis: Secondary | ICD-10-CM

## 2018-04-17 DIAGNOSIS — M159 Polyosteoarthritis, unspecified: Secondary | ICD-10-CM

## 2018-04-17 DIAGNOSIS — Z0000 Encounter for general adult medical examination without abnormal findings: Secondary | ICD-10-CM

## 2018-04-17 DIAGNOSIS — G060 Intracranial abscess and granuloma: Secondary | ICD-10-CM

## 2018-04-17 MED ORDER — DOXYCYCLINE HYCLATE 100 MG PO TABS: 100.0000 mg | ORAL_TABLET | Freq: Two times a day (BID) | ORAL | 0 refills | Status: DC

## 2018-04-17 NOTE — Patient Instructions (Signed)
Today we updated your med list in our EPIC system...    Continue your current medications the same...  Today we checked your follow up CXR... Please return to our lab in the AM for your FASTING blood work...    We will contact you w/ the results when available...   We wrote a new prescription for DOXYCYCLINE antibiotic...    Take one tab twice daily x 10d til gone...  Keep your follow up appt w/ DrPatel, NEURO regarding the dizziness...  Call for any questions...  Let's plan a follow up visit in 5mo, sooner if needed for problems.Marland KitchenMarland Kitchen

## 2018-04-17 NOTE — Progress Notes (Signed)
Subjective:    Patient ID: Gregory [name], male    DOB: [birth year], 77 y.o.   MRN: [id number]  HPI    77 y/o WM here for a follow up visit... he has multiple medical problems including:  Hx OWR syndrome;  Hx atypCP;  Hyperchol;  Hypothyroidism;  GERD/ Divertics/ IBS/ Colon polyps;  BPH/ BOO;  DJD/ LBP/ Plantar fasciitis;  Hx dizziness;  Anxiety & chronic persistant insomnia... 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  LABS 10/14:  FLP- on diet alone looks pretty good w/ LDL=111...  EKG 10/14 showed SBrady, rate57, otherw wnl;  2DEcho 10/14 showed normal LV size & wall thickness, norm LVF w/ EF=55-60%, Gr1DD, mildly thickened AoV leaflets, mild MR;  ETT 11/14 showed 55mn on treadmill, hypertensive BP response, rare PVC, no ischemia, O2 sats dropped to 87% w/ exercise...  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 Given this mild airflow obstruction we will start Rx w/ Dulera100- 2spBid   CXR 3/15 showed normal heart size, clear lungs, mild apical scarring, right basilar density c/w AVM based on CT report 2004, NAD..Marland KitchenMarland Kitchen EKG 3/15 showed NSR, rate87, low volt, NAD...  ADDENDUM> LABS 4/15 showed>  FLP- TChol 137, TG 30, HDL 36, LDL 95;  Chems- wnl;  CBC- anemic w/ Hg=11.3 & MCV=77;  TSH=9.26...   Ret for Iron panel (Fe=16- 4%sat), Ferritin (6.4), Stool cards (pending); then start FeSO4-3253mID w/ VitC500... Recheck CBC, Iron level in 6-8weeks...   Taking Synthroid112 regularly, therefore increase back to 12548md... Recheck TSH in 553mo542moe never returned.  LABS 10/15:  CBC- wnl w/ Hg= 16.5, Fe=88 (23%sat),  Ferritin=36;  TSH=5.10... rec to continue Fe supplement daily...  CXR 4/16 showed norm heart size, clear lungs w/ some hyperinflation, RLL AVM noted w/o change...  EKG 4/16 revealed poor tracing- NSR, rate68, minor NSSTTWA, NAD...   LABS 4/16:  FLP- at goals on diet alone;  Chems- wnl;  CBC- wnl;  TSH=4.38...   ~  June 04, 2015:  53mo 50mo& post hosp check>  Gregory wMikki Santeehosp 5/24 - 05/21/15 by [facility] after presenting w/ HA & right leg weakness; he tells me he saw his dentist for routine cleaning 2wks prior to this; eval revealed a brain lesion in left basal ganglionic region (tumor vs abscess); he had brain stereotactic bx 5/27 by DrNundkumar (left frontal craniectomy) & thick pus was removed- cultures +peptostreptococcus species (otherw neg aerobic bact cult, AFB, Fungi); he was seen by ID & placed on Rochepin, Vanco, Flagyl to be continued 42mo p653modisch (they are checking levels and adjusting dose); also started on Keppra500Bid per NS;  Since disch he is feeling better- no f/c/s, notes sl dry cough/ no sput, & the cough is keeping him awake- TUSSIONEX called in & helps he says;  Sl nausea relieved by phenergan prn...  EXAM reveals Afeb, VSS, O2sat=94% on RA at rest;  HEENT- OWR telangiectasias w/o change;  Chest- few basilar crackles w/o wheezing, rhonchi, consolidation;  Heart- RR w/o m/r/g;  Abd- neg;  Ext- neg w/o c/c/e...  CT Head 5/16 showed 2 x 3 cm LEFT basal ganglia mass, likely tumor vs abscess, moderately extensive surrounding edema, small surgical clips within or near the posterior wall LEFT maxillary sinus/LEFT pterygopalatine fossa, in this patient with history of nose bleed (these are stable from prior scans)...  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   EKG 5/16 showed NSR, rate68, wnl, NAD.Marland KitchenMarland Kitchen  Ven Dopplers 6/16 were neg for DVT  LABS- reviewed> only pos cult= peptostreptococcus from brain bx;  Chems- ok x Na & Ca sl low;  CBC- ok x WBC 14=>11... 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 Given IS, refill Phenergan & Tussionex...  ~  July 31, 2015:  274moOV & add-on appt requested for several complaints>  [name] finished his antibiotic Rx for brain abscess (likely due to dental work several weeks prior to onset of neuro symptoms in [id number] DrCampbell has arranged for a follow up CT Brain 08/11/15 (this will be 3 wks off antibiotics);  He had an episode of tachycardia (?AFlutter w/ rvr per EMS) w/ HR incr to 170's- went to ER 7/13 (but was in NSR) & he is currently wearing a Holter monitor per DrCrenshaw w/ follow-up Cards appt sched for next week...        He presents today w/ several complaints>  1) hoarseness: note this is intermittent, off & on, swallowing OK, denies indigestion/ reflux/ etc; he is on Protonix40 w/ his hx of OWR, plus zofran/ librax; we reviewed antireflux regimen & agreed to refer him to ENT for a look at his larynx & their opinion...Marland KitchenMarland Kitchen2) c/o numbness in toes and bottom of his feet bilat for the last week or so w/ some discomfort while walking; no culprit meds on his list & we offered referral to Neuro for a neuropathy evaluation...Marland KitchenMarland Kitchen3) he is c/o weight loss> weight today is 167#, 5'10" Tall, BMI=24 which is wnl; last wt=182# 19m61moo for a 15# wt loss, appetite is good & intake unchanged  he says; we discussed caloric intake and nutritional supplements; we also checked labs> Chems- wnl, BS=103, A1c=5.7;  CBC- wnl; Note- he is on Synthroid125m24m and TSH= 3.28 when checked 07/03/15...   CXR 07/02/15 showed norm heart size, clear lungs, NAD...   CT Head 08/11/15 showed left frontal craniotomy, resolved abscess in left external capsule w/ decr edema and decr enhancement...  2DEcho 08/14/15 showed norm LV size & function w/ EF=55-60%, no regional wall motion abn, Gr2DD, trace MR & TR  ~  October 02, 2015:  19mo 519mo& Gregory hMikki Santeegained 4# up to #171# today on Ensure 1can/d:  We discussed the need for AMOXICILLIN 2gm 1H prior to dentist...    He saw ENT (we do not have note) & pt indicates that his cords were sl thinned, they offered injection rx  but his hoarseness is improved & holding off...     He saw DrCampbell, ID on 8/1> f/u anaerobic brain abscess after dental work & presented w/ right sided weakness (resolved); he finished 51moof Levaquin & Flagyl, no f/c/s, neuro exam was WNL, & f/u scan 8/22 w/ resolved abscess; pt tells me DrCampbell signed off...     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 Overall, these findings are severe in degree electrically with respect to sensory responses. 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  We reviewed prob list, meds, xrays and labs> NOTE: THEY WANT ANOTHER CT HEAD IN [id number]..  ~  January 01, 2016:  337moOV & BoMikki Santees stable- persistent neuropathy symptoms (?related to Flagyl Rx?), c/o cold hands, speech difficulty (tongue feels bigger), ?memory problem- offered speech path assessment but he notes Neuro f/u appt in several weeks & wants to wait;  Hoarseness improved w/ MMW;  DrCampbell, ID has signed off- pt & wife want another CT Head to f/u the prev brain abscess & we will order;  He knows to take AMOX prior to any procedures (due to his OWR)... No other interval visits in the last 27m27moe requests mult med refills- ok...  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    Hx Cough, mild COPD, AVM in RLL area w/ some hypoxemia during exercise> prev on Dulera100-2spBid, now off; breathing improved w/ exercise; O2 sat= 91% on RA today; c/o dry throat & encouraged to use lozenges etc...    Hx CP, episode of PAFlutter> w/u by DrCHilary Hertzs neg, no recurrent CP or arrhythmia- he remains active but notes DOE w/ stairs, ok on level ground, 2DEcho & GXT were OK x O2 sat drop to 87% at peak exercise- ?incr shunting thru his RLL AVM...    Chol> Intol to Simva40 & Cres5 due to musc cramps; FLP 4/16 on diet alone showed TChol 146, TG 85, HDL 35, LDL 94...    Hypothy>  On Synthroid 125m61m now>  Labs 7/16 revealed TSH= 3.28 & clinically euthyroid, continue same...    GI- GERD, IBS, Polyps> on Protonix40Qd & Zofran/ Librax prn; [facility]> note reviewed (heme pos stool & anemia from epistaxis & NSAIDs); he had colonoscopy 3/12- neg x for hems & DrM rec f/u 69yrs30yr   GU>  Hx BPH w/ BOO on Rapaflo 8mg/d18mr  DrEskridge; last seen 1/16 & doing satis & pt reports that his PSA was OK...    Ortho> on OTC meds prn; eval 12/11 by DrNorris for bilat leg pain & cramping, ?min atrophy  of left gastroc- he rec tonic water & neurology eval> he saw DrWillis 3/12 (note reviewed)> NCV was normal; EMG c/w mild S1 radiculopathy; he rec MRI- pt decided against the MRI;  He reports some discomfort in knees, hips, back & we decided on trial of Tramadol50...    Anxiety/ Insomnia>  He has Chlorazepate7.5 for Prn use, and Ambien10 to use for sleep Qhs... EXAM reveals Afeb, VSS, O2sat=91% on RA at rest;  Wt is up 8# to 179#; HEENT- OWR telangiectasias w/o change;  Chest- few basilar crackles w/o wheezing, rhonchi, consolidation;  Heart- RR w/o m/r/g;  Abd- neg;  Ext- neg w/o c/c/e...  LABS 1/17>  BMet- wnl  CT Brain 1/17> pending IMP/PLAN>>  Gregory [name] has mult somatic complaints lingering & he is still quite anxious; he wants another CT scan & we will order this; he is encouraged to continue his regular f/u appts w/ his specialists & we will recheck in 213month..  ADDENDUM>>  CT Brain1/17/17 w/ contrast showed no acute changes, area of hypoattenuation in left ext capsule & lentiform nucleus extending to the white matter of the centrum semiovale is improved from 07/2015, no abn post contrast enhacement- residual infection is unlikely... He will decide if he wants to repeat the scan in ~667mo.  ~  June 07, 2016:  13m6moV & pulmonary/medical follow up>  BobMikki Santees c/o persistent neuropathy symptoms and right upper lip is numb, otherw feeling well/ energy good/ no f/c/s, no cough/ no sput "except at night" he says;  Notes that he'll wake 1-2 x per week & wakes him up => we discussed reflux & a vigorous antireflux regimen (PPI before dinner, NPO after dinner, elev HOB)... We reviewed the following medical problems during today's office visit >>     OWR>  He has Osler-Weber-Rendu syndrome w/ mult telangiectasias (skin, lips, nasal w/ prev nose  bleeds- mult surg & occluded nares, AVM in RLL on CXR w/o hemoptysis);  Stable- doing satis w/o recurrent bleeding from nose...    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    Hx CP, episode of PAFlutter> w/u by DrCHilary Hertzs neg, no recurrent CP or arrhythmia- he remains active but notes DOE w/ stairs, ok on level ground, 2DEcho & GXT were OK x O2 sat dropped to 87% at peak exercise- ?incr shunting thru his RLL AVM... Marland KitchenMarland Kitchenen by Cards 2/17> OWR, AFlutter, HL; adm w/ brain abscess 5/16 (predisposed by his OWR?); on ToprolXL25, tol well, no recurrent arrhythmia...     Chol> Intol to Simva40 & Cres5 due to musc cramps; FLP 6/17 on diet alone showed TChol 155, TG 60, HDL 42, LDL 101...    Hypothy>  On Synthroid 1213m713m now>  Labs 6/17 revealed TSH= 2.41 & clinically euthyroid, continue same...    GI- GERD, IBS, Polyps> on Protonix40Qd & Zofran/ Librax prn; [facility]> note reviewed (heme pos stool & anemia from epistaxis & NSAIDs); he had colonoscopy 3/17 w/ divertics, hems, no polyps...    GU>  Hx BPH w/ BOO prev on Rapaflo 8mg/63mer DrEskridge; urodynamics showed small capacity hypersens bladder- note 12/2015 reviewed...    Ortho/ Neuro- neuropathy, hx brain abscess> on OTC meds prn; eval 12/11 by DrNorris for bilat leg pain & cramping, ?min atrophy of left gastroc- he rec tonic water &  neurology eval> he saw DrWillis 3/12 (note reviewed)> NCV was normal; EMG c/w mild S1 radiculopathy; he rec MRI- pt decided against the MRI;  He reports some discomfort in knees, hips, back & we decided on trial of Tramadol50... He saw DrPatel 12/2015- hx brain abscess 5/16, followed by neuropathy symptoms, and neuro notes reviewed...     Anxiety/ Insomnia>  He has Chlorazepate7.5 for Prn use, and Ambien10 to use for sleep Qhs... EXAM  reveals Afeb, VSS, O2sat=91% on RA at rest;  Wt is up 8# to 179#; HEENT- OWR telangiectasias w/o change;  Chest- few basilar crackles w/o wheezing, rhonchi, consolidation;  Heart- RR w/o m/r/g;  Abd- neg;  Ext- neg w/o c/c/e...  LABS 06/09/16>  FLP- all parameters at goals on diet alone;  Chems- wnl;  CBC- wnl;  TSH=2.41;  PSA=1.58 IMP/PLAN>>  We reviewed his reflux symptoms and advised a vigorous antireflux regimen w/ Protonix40 before dinner, NPO after dinner, Elev HOB; otherw same meds 7 [name] in 67mosooner prn...  ~  December 07, 2016:  6724moOV & Gregory reports doing well- no new complaints or concerns "I feel good"; he does note some right leg numbness but notes that his upper lip is better and his toe neuropathy is better...     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    He has Hx CP & PAFlutter> followed by DrHilary Hertz stable on MetoprololER25/d...    [facility] & note reviewed- OWR, GERD, divertics, colon adenoma; prev on Protonix40 but concern for memory [facility] He had EGD 09/2016 w/ gastric & duodenal AVMs noted...    He had Neuro f/u DrPatel 08/25/16> hx brain abscess (occurred after dental work), bilat feet paresthesias (thought to be due to Flagyl), intol to Gabapentin w/ GI side effects, PT helped; noted recent vision issues...     He had Urology appt w/ [facility]- 07/26/16> prev followed by DrEskridge, c/o freq/ urgency/ nocturia, improved on Myrbetriq50 EXAM reveals Afeb, VSS, O2sat=94% on RA at rest;  Wt is stable at 183#; HEENT- OWR telangiectasias w/o change;  Chest- few basilar crackles w/o wheezing, rhonchi, consolidation;  Heart- RR w/o m/r/g;  Abd- neg;  Ext- neg w/o c/c/e... IMP/PLAN>>  BoMikki Santees stable & doing reasonably well; he says DrMedoff stopped his Protonix because he was on it for yrs and over concern for  dementia & switched him to [employer] We gave him the Prevnar-13 vaccine today & now he is up-to-date...  ~  June 07, 2017:  24m45moV & Gregory reports that he's had some signif right shoulder pain- seen by Ortho but we do not have notes from them, states he was given a musc relaxer (no NSAIDs) & getting PT;  He also reports a f/u w/ allergy- DrSharma for "cough" and given Singulair10/ Xyzal5 which has helped... We reviewed the following medical problems during today's office visit >>     S/p bilat cataract surg DrBevis 2018    OWR>  He has Osler-Weber-Rendu syndrome w/ mult telangiectasias (skin, lips, nasal w/ prev nose bleeds- mult surg & occluded nares, AVM in RLL on CXR w/o hemoptysis, & upper GI tract);  Stable overall w/ only intermittent epistaxis, denies GI bleeding, etc... He is again anemic & we'll need to re-evaluate & rx his iron decifiency...    AR>  followed by DrSharma & recently started on Singulair10 + Xyzal5 w/ improvement in symptoms...    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    Hx CP, episode of PAFlutter> w/u by Hilary Hertz was neg, no recurrent CP or arrhythmia- he remains active but notes DOE w/ stairs, ok on level ground, 2DEcho & GXT were OK x O2 sat dropped to 87% at peak exercise- ?incr shunting thru his RLL AVM.Marland KitchenMarland Kitchen Seen by Cards 2/18> OWR, AFlutter, HL; adm w/ brain abscess 5/16 (predisposed by his OWR?); on ToprolXL25, tol well, no recurrent arrhythmia...     Chol> Intol to Simva40 & Cres5 due to musc cramps; FLP 6/18 on diet alone showed TChol 123, TG 47, HDL 42, LDL 72...    Hypothy>  On Synthroid 158mg/d now>  Labs 6/18 revealed TSH= 1.37 & clinically euthyroid, continue same...    GI- GERD, IBS, Polyps> on Protonix40Qd & Zofran/ Librax prn; [facility]> note reviewed (heme pos stool &  anemia from epistaxis & NSAIDs); he had colonoscopy 3/17 w/ divertics, hems, no polyps...    GU>  Hx BPH w/ BOO on Rapaflo 813md per DrEskridge; urodynamics showed small capacity hypersens bladder- note 12/2015 reviewed; now followed by DrCarmie Endseen 2/18 & note reviewed, frequency & nocturia, Myrbetriq helped but too $$, back on Rapaflo8...    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 He saw DrPatel for f/u 12/2016- hx brain abscess 5/16, followed for neuropathy symptoms, and neuro notes reviewed=> placed on Baclofen for musc spasticity...    Anxiety/ Insomnia>  He has Chlorazepate7.5 for Prn use...    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 EXAM reveals Afeb, VSS, O2sat=93% on RA at rest;  Wt is stable ~185#; HEENT- OWR telangiectasias w/o change & nasal obstruction  Chest- few basilar crackles w/o wheezing, rhonchi, consolidation;  Heart- RR w/o m/r/g;  Abd- neg;  Ext- neg w/o c/c/e...  LABS 06/07/17>  FLP- all parameters at goals on diet alone now;  Chems- wnl;  CBC- anemic w/ Hg-9.4, mcv=66;  TSH=1.37;  PSA=1.82...  Pt asked to return for anemia eval w/ repeat CBC  Fe  Ferritin  B12  SPE/IEP - all pending... 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  ADDENDUM>>  Additional labs confirm IDA w/ Hg=9.0, mcv=66, Fe=5 (1.1%sat), B12=817, SPE w/ low TProt/Alb & no M-spike... Rec to start FeSO4 325mg34m w/ VitC  and rec to incr protein intake daily... We plan recheck CMet & CBC/ iron panel in 90month..  ADDENDUM>>  LABS 07/11/17 showed CMet- wnl;  CBC- much improved w/ Hg=13.4, mcv=80;  Iron=137 (37%sat)... REC to decr FeSO4 to one daily w/ VitC & repeat CBC, Iron panel in 386mobefore halloween)... ADDENDUM>>  Labs 10/10/17 showed Hg=16.1 & Fe is good at 113 w/ 34% saturation;  REC to decr the FeSO4 + VitD to 3x per week on MWF & stay on this...  ~  November 02, 2017:  53m37moV & add-on appt requested after fall>  BobMikki Santeeports that he fell ~1wk ago (tripped in his garage); he hit the floor w/ his left hip/ buttock- c/o pain in left flank region (sore, tender, can't sleep);  There is no bruising and no bowel or bladder problems/ changes;  He denies cough, sput, hemoptysis, or trouble breathing; we discussed checking XRay of left lower ribs (neg), and treatment w/ rest/ heat/ Tramadol + Tylenol prn...     EXAM reveals Afeb, VSS, O2sat=92% on RA at rest;  Wt is stable ~186#; HEENT- OWR telangiectasias w/o change & nasal obstruction  Chest- few basilar crackles w/o wheezing, rhonchi, consolidation;  There is a sl bruise on his left flank area & it's tender to palp;  Heart- RR w/o m/r/g;  Abd- neg;  Ext- neg w/o c/c/e...  Left Rib Detail films 11/02/17>  No rib fx identified, lung is clear & wnl... IMP/PLAN>>  We discussed Rx w/ REST (rib binder if nec), HEAT, Tramadol50 + Tylenol500 as needed for pain;  He will call for any concerns going forward;  He is continuing w/ balance exercises etc...   ~  December 07, 2017:  53mo76mo & Gregory reports feeling well now- left flank area/ lower rib pain has resolved w/ Rx & time;  His  only other complaint today is some rectal discomfort & we discussed Rx w/ Anusol HC cream.    He remains on Xyzal, Singulair, and knows to use antibiotics prior to dental work (hx brain abscess 2016); his breathing is good- cough improved, no sput or blood...    He is taking ToprolXL25- 1/2 tab daily & BP=126/62 without HA, visual symptoms, CP, palpit, SOB, edema...    He continues Protonix & Librax, and he denies abd pain, n/v, c/d, blood seen...    Stools remain dark w/ Fe + VitC Q-mwf, labs 10/18 were back to normal... EXAM reveals Afeb, VSS, O2sat=91% on RA at rest;  Wt is stable ~189#; HEENT- OWR telangiectasias w/o change & nasal obstruction  Chest- few basilar crackles w/o wheezing, rhonchi, consolidation;  Heart- RR w/o m/r/g;  Abd- neg;  Ext- neg w/o c/c/e... IMP/PLAN>>  Gregory Gregory Santeeback to baseline now- left flank area/ lower rib pain has resolved;  Only complaint today is some rectal discomfort & we prescribed AnusBlue Grassam- he will call if not responding;     ~  April 17, 2018:  76mo 80mo& general medical follow up visit> Gregory rMikki Santeerts several issues today- 1) he feels as though his balance is off, recall prev hx dizziness & prev eval by DrWillis for Neuro and DrPatel for NS, he has vestib rehab prev which helped; 2) [place]... he has known OWR w/ occas nosebleeds & some blood in his mouth, followed by ENT w/ prev surg, still taking his FeSO4 on MWF;  His breathing is good- denies  cough, sput, hemoptysis, SOB or DOE- exercising w/ walking & doing yoga at home (no CP/ palpit/ edema;  He also reports some urinary incont & has been eval by Urology- DrEskridge... We reviewed the following medical problems during today's office visit>      S/p bilat cataract surg DrBevis 2018    OWR>  He has Osler-Weber-Rendu syndrome w/ mult telangiectasias (skin, lips, nasal w/ prev nose bleeds- mult surg & occluded nares, AVM in RLL on CXR  w/o hemoptysis, & upper GI tract);  Stable overall w/ only intermittent epistaxis, denies GI bleeding, etc... He was anemic & Hg improved to 15 now...    AR> followed by DrSharma & recently started on Singulair10 + Xyzal5 w/ improvement in symptoms...    Hx mild COPD, AVM in RLL area w/ some hypoxemia during exercise> prev on Dulera100-2spBid, now off; breathing improved w/ exercise; O2 sat= 92% on RA today; c/o dry throat & encouraged to use lozenges etc...    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     Chol> Intol to Simva40 & Cres5 due to musc cramps; FLP 5/19 on diet alone showed TChol 130, TG 71, HDL 38, LDL 78...    Hypothy>  On Synthroid 148mg/d now>  Labs 5/19 revealed TSH= 3.99 & clinically euthyroid, continue same...    GI- GERD, IBS, Polyps> on Protonix40Qd & Zofran/ Librax prn; [facility] note reviewed (heme pos stool & anemia from epistaxis & NSAIDs); he had colonoscopy 3/17 w/ divertics, hems, no polyps...    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    Anxiety/ Insomnia>  He has Chlorazepate7.5 for Prn use...    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 EXAM reveals Afeb, VSS, O2sat=92% on RA at rest;  Wt is stable ~187#; HEENT- OWR telangiectasias w/o change & nasal obstruction  Chest- few basilar crackles w/o wheezing, rhonchi, consolidation;  Heart- RR w/o m/r/g;  Abd- neg;  Ext- neg w/o c/c/e...  CXR 04/17/18 (independently reviewed by me in the PACS system) shows norm heart size, clear lungs- NAD, known RLL AVM..Marland KitchenMarland Kitchen  LABS 04/19/18>  FLP- ok w TChol 130, TG 71, HDL 38, LDL 78;  Chems- ok w/ Na=134,  BS=89, Cr=1.06, LFTs wnl;  CBC- ok w/ Hg=15.0;  TSH=3.99;  PSA=1.77... IMP/PLAN:  Gregory [name] remains stable w/ his mult medical issues- we discussed empiric Doxy after his tick exposure in [place] 2 wks ago & we will refer him to Neuro for f/u of his dizziness complaints;  otherw continue the same meds & we plan rov recheck in 79mo..           Problem List:   BRAIN ABSCESS  >>  [facility]21[facility]15 by [facility] after presenting w/ HA & right leg weakness; he tells me he saw his dentist for routine cleaning 2wks prior to this; eval revealed a brain lesion in left basal ganglionic region (tumor vs abscess); he had brain stereotactic bx 5/27 by DrNundkumar (left frontal craniectomy) & thick pus was removed- cultures +peptostreptococcus species (otherw neg aerobic bact cult, AFB, Fungi); he was seen by ID & placed on Rochepin, Vanco, Flagyl to be continued 157moost disch (they are checking levels and adjusting dose); also started on Keppra500Bid per NS... ~  6/16:  Since disch he is feeling better- no f/c/s, notes sl dry cough/ no sput, & the cough is keeping him awake- TUSSIONEX  called in & helps he says;  Sl nausea relieved by phenergan prn...  ~  8/16:  He was checked by DrCampbell, ID- they stopped antibiotics and rec f/u CT Brain 08/11/15= 3 wks off the antibiotic rx => resolved abscess. ~  1/17:  CT Brain - pending  OTHER DISEASES OF NASAL CAVITY AND SINUSES - Hx of Osler-Weber-Rendue syndrome/ hereditary telangiectasias... he's had numerous nose bleeds and surgery by DrEtter Sjogrennow followed by DrBarbaraann Faster. left nares is occluded w/ skin graft- no lumen... this is quite uncomfortable to the patient but he's been told there is nothing further that can be done for this... ~  3/11:  notes occas nosebleeds but he is able to handle them... ~  4/12:  No diff in his pattern of mild nose bleeds & he denies any severe epistaxis episodes... 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 ~  4/14:  He notes infreq bleeding now w/ saline etc... 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 Dyspnea w/ O2 desat w/ exercise >>  ~  12/14:  PFTs showed FVC=3.98 (89%), FEV1=2.72 (79%), %1sec=68, and mid-flows= 51% predicted; c/w mild obstructive dis & we decided on a trial of Dulera100-2spBid ~  4/15:  His breathing is improved w/ the [facility]... ~  10/15:  His breathing is back to baseline after FeSO4 rx for anemia & ret of Hg to normal; [facility] use... ~   4/16:  prev on Dulera100-2spBid, now just prn; breathing improved w/ exercise; O2 sat= 93% on RA today; c/o dry throat & encouraged to use lozenges etc... ~  CXR 4/16 showed norm heart size, clear lungs w/ some hyperinflation, RLL AVM noted w/o change... 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 known GI telangiectasia as well without signif GI bleeding in the past- followed [facility]... he's also has skin telangiectasis w/ prev laser therapy at [facility]... extensive nasal problems as above... ~  labs 2/09 showed Hg= 17.0 ~  labs 2/10 showed Hg= 16.4 ~  CXR 3/11 is chr incr markings esp RLL- no change, NAD...  ~  Labs 6/11 showed Hg= 15.9 ~  Labs 4/12 showed Hg= 16.0 ~  CXR 10/12 showed mild biapical scarring, RLL AVM, NAD... ~  Labs 4/13 showed Hg= 15.2 ~  CXR 8/13 showed heart at upper lim of norm, increased markings & apical scarring, chr incr markings in RLL- AVM, NAD.[name]. 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 ~  CXR 3/15 showed normal heart size, clear lungs, mild apical scarring, right basilar density c/w AVM based on CT report 2004, NAD.[name]. ~  CXR 4/16 showed norm heart size, clear lungs w/ some hyperinflation, RLL AVM noted w/o change... 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  Hx of CHEST PAIN (ICD-786.50) - seen Sep09 w/ several  recent bouts of CP while working in yard, washing car, etc... resolved w/ rest & Protonix... EKG showed WNL, prev NuclearStressTest 1/04 at [facility] was neg (no ischemia or infarction and EF= 61%)... Cardiac eval DrCrenshaw w/ 2DEcho 10/09 showing norm LV- no regional wall motion abn, norm LVF w/ EF= 60%, mild dil RA/ RV;  and norm MYOVIEW (no ischemia or infarction, EF= 58%)... ~  10/14: repeat cardiac eval by DrCrenshaw due to [place] w/ stairs; EKG 10/14 showed SBrady, rate57, otherw wnl;  2DEcho 10/14 showed normal LV size & wall thickness, norm LVF w/ EF=55-60%, Gr1DD, mildly thickened AoV leaflets, mild MR;  ETT 11/14 showed 28mn on treadmill, hypertensive BP response, rare PVC, no ischemia, O2 sats dropped to 87% w/ exercise ~  EKG 3/15 showed NSR, rate87, low volt, NAD..Marland KitchenMarland Kitchen  Episode of AFLUTTER >> this occurred 06/2015 & notes from Cards ([facility] SRichardson Doppare reviewed); placed on lose dose TOPROL-XL & titrated up to 212md... Holter monitor placed and results pending...   HYPERCHOLESTEROLEMIA, MILD (ICD-272.0) - now on CRESTOR 81m63m>  Prev Rx w/ Simva40 but developed muscle cramping which resolved off the med. ~  FLP 6/08 showed TChol 208, TG 75, HDL 34, LDL 139... he preferred diet Rx- ~  FLP 2/09 showed TChol 172, TG 69, HDL 30, LDL 129... he agreed to Cre[place] ~  [place] on Crestor10 showed TChol 91, TG 49, HDL 28, LDL 54... rec- keep same. ~  FLP 2/10 on Crestor10 showed TChol 108, TG 56, HDL 31, LDL 66 ~  9/10:  we discussed changing Crestor10 to Simvastatin40 for $$$  reasons... ~  [place] 3/11 on Simva40 showed TChol 105, TG 59, HDL 39, LDL 54... continue same. ~  [place] 4/12 on Simva40 showed TChol 104, TG 62, HDL 29, LDL 63... C/o severe muscle cramps & wants to stop for 69mo cramping resolved! ~  Referred to [facility] & they restarted his Crestor at 531md> tol well so far... ~  FLP 10/12 on Cres5 showed TChol 114, TG 40, HDL 36, LDL 70 ~  FLP 2/13 on Cres5 showed TChol 124, TG  21, HDL 40, LDL 80 ~  FLP 4/14 on Cres5 showed TChol 119, TG 44, HDL 38, LDL 72  ~  6/14: pt stopped Cres5 on his own due to leg cramps & decided to hold statins for now w/ repeat FLP off meds in several months... ~  FLP 10/14 on diet alone showed TChol 155, TG 29, HDL 38, LDL 111  ~  FLP 4/15 on diet alone showed TChol 137, TG 30, HDL 36, LDL 95 ~  FLP 4/16 on diet alone showed TChol 146, TG 85, HDL 35, LDL 94  HYPOTHYROIDISM - on SYNTHROID 11265md now; prev 125m17m dose was decreased 4/12... ~  labs 2/09 showed TSH= 0.69...  8/09 showed TSH= 0.44... ~  labs 2/10 showed TSH= 0.68 ~  labs 3/11 showed TSH= 0.52 ~  Labs 4/12 on Synthroid125 showed TSH= 0.33... We decided to decr the dose to 112/d. ~  Labs 4/13 on Levo112 showed TSH= 4.60 ~  Labs 4/14 on Levo112 showed TSH= 5.46 ~  Labs 4/15 on Levo112 showed TSH= 9.26 and we decided to incr dose back to 125mc24m.. ~  Labs 4/16 on Levo125 showed TSH= 4.38  GERD, IRRITABLE BOWEL SYNDROME, COLONIC POLYPS - [facility] (he tried OTC Zantac 75mg 56mis own)... [place]  [place]  colonoscopy 1/04 w/ divertics, diminutive rectal polyp (adenomatous), hems...  ~  f/u colon 1/07 showed no recurrent polyps... ~  [facility]yrs..61yrYPERTROPHY PROSTATE W/UR OBST & OTH LUTS (ICD-600.01) - followed by DrRDaviKCLEXNTZAFLO 8mg/d (30mpped Flomax due to side effect- ED)... doing well without LTOS, just complains of nocturia x 1-2 ~  labs 2/09 showed PSA= 0.76 ~  labs 2/10 showed PSA= 0.90 ~  2011 PSA checked by DrDavis (pt states it was OK)... ~  Labs 4/12 showed PSA= 1.22 ~  Labs 4/13 showed PSA= 1.31 ~  10/13: presents c/o incr freq, nocturia x2-4, & weak stream despite Rapaflo; he is intol to Flomax; refer to Urology for further eval=> seen by DrEskridge, no changes made... ~  1/15: f/u by Urology, DrEskridge> BPH, LUTS, hypersens unstable bladder; on  Rapaflo8 & doing satis, PSA reported to be 2.34...  LEFT ARM & SHOULDER PAIN >> he was checked by GboroOrtLacie Scotts improving he says; [facility]> [facility] reviewed.  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 ~  9/10:  requesting Rx for Robaxin for muscle spasm as needed.  PLANTAR FASCIITIS (ICD-728.71) - c/o pain in his feet secondary to plantar fasciitis eval & [facility]/ [facility] & shots... ~  9/10: we discussed [facility].  Hx NOCTURNAL LEG CRAMPS >>  ~  This initially occurred several yrs ago while on Simvastatin & the cramps resolved off this med; he was subseq placed  on Cres5 & tol well... ~  6/14: he indicates that noct leg cramps returned x1wk & he stopped the Cres5 w/ improvement; he wants to leave off the statins for now; rec to try tonic water/ yellow mustard prn...  Hx of DIZZINESS (ICD-780.4) - sudden episode dizziness Sep09 working at [employer] around [employer] w/ room spinning- lasted 25mn and resolved spontaneously, felt light headed afterwards... no LOC, syncope, seizure activity, etc... no CP/ palpit/ SOB/ etc... there were several EMTs in the restaurant and they checked him out- stable and transported to ER... 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  ANXIETY (ICD-300.00) & INSOMNIA, CHRONIC (ICD-307.42) - he uses CHLORAZEPATE 7.592mPrn & AMBIEN1031mhs for chronic persistant insomnia...  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  ANEMIA >>  ~  LABS 4/13 showed Hg= 15.2, MCV=91 ~  LABS 4/14 showed Hg= 14.5, MCV=84 ~  LABS 3/15 showed Hg= 13.6, MCV=78 ~  LABS 4/15 showed Hg= 11.3, MCV=  77, Iron studies showed Fe= 16 (3.6%sat), Ferritin= 6.4, & stool cards neg; Rx w/ FeSO4... ~  Labs 10/15 showed Hg= 16.5, MCV= 91, Fe=88 (23%sat), Ferritin= 36...  ~  Labs 4/16 showed Hg= 16.0  Health Maintenance: ~  [facility]:  [facility] etc... (last 1/07 & 75yr3yr due 1/12). ~  GU:  followed by DrRDavis in past; PSAs have all been wnl... ~  Immunizations:  he gets yearly Flu vaccinations in the fall;  OK Pneumovax 03/12/10 at [age]- [age]  Past Surgical History:  Procedure Laterality Date  . APPLICATION OF CRANIAL NAVIGATION N/A 05/16/2015   Procedure: APPLICATION OF CRANIAL NAVIGATION;  Surgeon: NeelConsuella Lose;  [facility];  Service: Neurosurgery;  Laterality: N/A;  . BRAIN BIOPSY Left 05/16/2015   Procedure: Stereotactic Left Brain Biopsy with Brain Lab;  Surgeon: NeelConsuella Lose;  [place] [facility] [place];  Service: Neurosurgery;  Laterality: Left;  Stereotactic Left Brain biopsy with brainlab  . CARDIOVASCULAR STRESS TEST  01-14-2003   NO ISCHEMIA / EF 61%/ NORMAL LE WALL MOTION  . EXCISION LEFT WRIST GANGLIAN/ MYXOID CYST  05-30-2009  . 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 OTHER SURGICAL HISTORY     pulse laser for facial telangiectasias inthe past  . removal of skin cancer from forehead  10/2012   Dr. HaveRenda RollsTRANSTHORACIC ECHOCARDIOGRAM  10-17-2008   DR CRENSHAW   NORMAL LVF/ EF 60%/  MILDLY DILATED RIGHT ATRIUM/ VENTRICULE  . VARICOCELECTOMY  1991   Dr. RonaTresa EndoOutpatient Encounter Medications as of 04/17/2018  Medication Sig  . acetaminophen (TYLENOL) 325 MG tablet Take 650 mg by mouth daily as needed (pain).  . amMarland Kitchenxicillin (AMOXIL) 500 MG capsule TAKE 4 CAPSULES 1 HOUR PRIOR TO PROCEDURE  . Cholecalciferol (VITAMIN D3) 2000 UNITS TABS Take 2,000 Units by mouth daily.  . clidinium-chlordiazePOXIDE (LIBRAX) 5-2.5 MG capsule Take 1 capsule by mouth 3 (three) times  daily as needed.  . clorazepate (TRANXENE) 7.5 MG tablet TAKE 1 TABLET 3 TIMES A DAY AS NEEDED  . Diphenhyd-Hydrocort-Nystatin (FIRST-DUKES MOUTHWASH) SUSP 1 tsp gargle and swallow  four times daily  . FERROUS SULFATE PO Take 65 mg by mouth 3 (three) times a week. Mon, wed, Fri  . hydrocortisone (ANUSOL-HC) 2.5 % rectal cream Use as directed.  [name] levocetirizine (XYZAL) 5 MG tablet Take 5 mg by mouth every evening.  [name] levothyroxine (SYNTHROID, LEVOTHROID) 125 MCG tablet TAKE 1 TABLET BY MOUTH  DAILY BEFORE BREAKFAST  . Magnesium 250 MG TABS Take 250 mg by mouth at bedtime.   . meclizine (ANTIVERT) 25 MG tablet Take 1 tablet (25 mg total) by mouth daily as needed for dizziness.  . metaxalone (SKELAXIN) 800 MG tablet Take 1 tablet (800 mg total) by mouth 3 (three) times daily.  . metoprolol succinate (TOPROL-XL) 25 MG 24 hr tablet TAKE 1 TABLET BY MOUTH  DAILY (Patient taking differently: TAKE 0.5  TABLET BY MOUTH  DAILY)  . montelukast (SINGULAIR) 10 MG tablet Take 1 tablet (10 mg total) by mouth at bedtime.  . Multiple Vitamin (MULTIVITAMIN WITH MINERALS) TABS tablet Take 1 tablet by mouth daily. Centrum  . pantoprazole (PROTONIX) 40 MG tablet TAKE 1 TABLET BY MOUTH  DAILY  . silodosin (RAPAFLO) 8 MG CAPS capsule Take 1 capsule (8 mg total) by mouth at bedtime.  . vitamin C (ASCORBIC ACID) 500 MG tablet Take 500 mg 3 (three) times a week by mouth.   . [DISCONTINUED] traMADol (ULTRAM) 50 MG tablet Take 1 tablet (50 mg total) 3 (three) times daily by mouth.  . doxycycline (VIBRA-TABS) 100 MG tablet Take 1 tablet (100 mg total) by mouth 2 (two) times daily.   No facility-administered encounter medications on file as of 04/17/2018.     Allergies  Allergen Reactions  . Nsaids Other (See Comments)    Cannot take-per MD  . Other Other (See Comments)    All blood thinners-cannot take per MD  . Aspirin Other (See Comments)    nosebleeds  . Statins Other (See Comments)    Weakness, myalgias     Immunization History  Administered Date(s) Administered  . Influenza Split 09/21/2011, 09/27/2012  . Influenza Whole 09/11/2008, 09/11/2009, 09/22/2010  . Influenza, High Dose Seasonal PF 09/09/2016, 09/16/2017  . Influenza,inj,Quad PF,6+ Mos 10/04/2013, 10/08/2014, 10/02/2015  . Pneumococcal Conjugate-13 12/07/2016  . Pneumococcal Polysaccharide-23 03/12/2010  . Tdap 10/04/2013  . Varicella 12/20/2009     Current Medications, Allergies, Past Medical History, Past Surgical History, Family History, and Social History were reviewed in [employer] record.    Review of Systems       See HPI - other systems neg except as noted... The patient denies anorexia, fever, weight loss, weight gain, vision loss, decreased hearing, hoarseness, chest pain, syncope, dyspnea on exertion, peripheral edema, prolonged cough, headaches, hemoptysis, abdominal pain, melena, hematochezia, severe indigestion/heartburn, hematuria, incontinence, muscle weakness, suspicious skin lesions, transient blindness, difficulty walking, depression, unusual weight change, abnormal bleeding, enlarged lymph nodes, and angioedema.     Objective:   Physical Exam      WD, WN, 77 y/o WM in NAD... 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 GENERAL:  Alert & oriented; pleasant & cooperative... HEENT:  [place]/AT, EOM-wnl, PERRLA, EACs-clear, TMs-wnl, NOSE- occluded left nares from skin grafts, MOUTH- mucosal telangiectasias.  NECK:  Supple w/ fairROM; no JVD; normal carotid impulses w/o bruits; no thyromegaly or nodules palpated; no lymphadenopathy. CHEST:  Clear to P & A; without wheezes/ rales/ or rhonchi. HEART:  Regular Rhythm; without murmurs/ rubs/ or gallops. ABDOMEN:  Soft & nontender; normal bowel sounds; no organomegaly or masses  detected. EXT: without deformities, mild arthritic changes; no varicose veins/ venous insuffic/ or edema. NEURO:  CN's intact; motor testing normal; sensory testing  normal; gait normal & balance OK. DERM:  mult telangiectasias...  RADIOLOGY DATA:  Reviewed in the EPIC EMR & discussed w/ the patient...  LABORATORY DATA:  Reviewed in the EPIC EMR & discussed w/ the patient...   Assessment & Plan:    10/23/17>  Pt tripped in his garage & fell on his left side w/ sl bruise & tenderness over the lower ribs; XRay NEG for Fx & we discussed Rx w/ REST (rib binder if nec), HEAT, Tramadol50 + Tylenol500 as needed for pain;  He will call for any concerns going forward;  He is continuing w/ balance exercises etc...  12/07/17>   All symptoms resolved w/ time & rx 04/17/18>   Gregory [name] remains stable w/ his mult medical issues- we discussed empiric Doxy after his tick exposure in [place] 2 wks ago & we will refer him to Neuro for f/u of his dizziness complaints;  otherw continue the same meds & we plan rov recheck in 77mo   [facility], DrCampbell, finished antibiotic rx 07/2015 and f/u CT Brain=> resolved abscess... He will need antibiotic prophylaxis prior to future dental work...  Hx Cough, Dyspnea on exertion, mild COPD >> as above, prev on Dulera100- 2sp Bid & exercise program- improved; we will continue to monitor his status & O2 sats; Dyspnea improved w/ Fe for anemia & ret of Hg to norm... He has Tussionex prn cough... 8/16> c/o intermittent hoarseness & referred to ENT for eval of cords => we don't have note but pt indicates sl thinning of cords and they offered injections but he's holding off...  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     Hx CP, AFlutter>  Prev cardiac w/u by  DrCrenshaw was neg, no recurrent CP complaints, but he notes DOE w/ stairs & DrCrenshaw repeated 2DEcho & ETT as above... Episode of PAFlutter 06/2015=> holter monitor per cards- pending & pt improved on ToprolXL25...      Chol>  Off statins due to recurrent cramps, on diet alone & FLP is reasonable...     Hypothy>  On Synthroid 1232m/d now>  He is clinically & biochem euthyroid...     GI>  [facility]- neg x for hems & DrM rec f/u 5y26yr.     GU>  Hx BPH w/ BOO & followed by DrEskridge on Rapaflo 8mg96m& stable...     Ortho>  See above- prev stable on OTC analgesics as needed, but recent incr symptoms- try Tramadol50 prn...  LEG CRAMPS >> he is quite sure they are from his statin Rx & we decided to leave these off for now, try to control on diet/ exercise & continued monitoring... 8/16> c/o numbness in toes & bottom of feet 8/16>  Refer to Neuro for neuropathy eval...     Anxiety/ Insomnia>  He has Chlorazepate for Prn use, and Ambien to use for sleep Qhs...  Anemia>  Prev on FeSO4 supplement &  Hg now back to normal, dyspnea ret to baseline 7 he stopped the Fe... 06/07/17>   Gregory [name] is anemic again- likely the result of his OWR & intermittent mild nasal bleeding over the past yr; he has known upper GI telangiectasias & knows NOT to consume NSAIDs etc; he denies n/v/ hemetemesis and takes PPI regularly; he had colonoscopy 3/17 w/o recurrent polyps, +divertics and hems; options are oral iron replacement therapy vs IV Feraheme=> he prefers oral iron therapy therefore start FeSO4 364m/d w/ vitC500 & we will recheck CBC/ Fe in 6-8wks.   Patient's Medications  New Prescriptions   DOXYCYCLINE (VIBRA-TABS) 100 MG TABLET    Take 1 tablet (100 mg total) by mouth 2 (two) times daily.  Previous Medications   ACETAMINOPHEN (TYLENOL) 325 MG TABLET    Take 650 mg by mouth daily as needed (pain).   AMOXICILLIN (AMOXIL) 500 MG CAPSULE    TAKE 4 CAPSULES 1 HOUR PRIOR TO PROCEDURE    CHOLECALCIFEROL (VITAMIN D3) 2000 UNITS TABS    Take 2,000 Units by mouth daily.   CLIDINIUM-CHLORDIAZEPOXIDE (LIBRAX) 5-2.5 MG CAPSULE    Take 1 capsule by mouth 3 (three) times daily as needed.   CLORAZEPATE (TRANXENE) 7.5 MG TABLET    TAKE 1 TABLET 3 TIMES A DAY AS NEEDED   DIPHENHYD-HYDROCORT-NYSTATIN (FIRST-DUKES MOUTHWASH) SUSP    1 tsp gargle and swallow four times daily   FERROUS SULFATE PO    Take 65 mg by mouth 3 (three) times a week. Mon, wed, Fri   HYDROCORTISONE (ANUSOL-HC) 2.5 % RECTAL CREAM    Use as directed.   LEVOCETIRIZINE (XYZAL) 5 MG TABLET    Take 5 mg by mouth every evening.   LEVOTHYROXINE (SYNTHROID, LEVOTHROID) 125 MCG TABLET    TAKE 1 TABLET BY MOUTH  DAILY BEFORE BREAKFAST   MAGNESIUM 250 MG TABS    Take 250 mg by mouth at bedtime.    MECLIZINE (ANTIVERT) 25 MG TABLET    Take 1 tablet (25 mg total) by mouth daily as needed for dizziness.   METAXALONE (SKELAXIN) 800 MG TABLET    Take 1 tablet (800 mg total) by mouth 3 (three) times daily.   METOPROLOL SUCCINATE (TOPROL-XL) 25 MG 24 HR TABLET    TAKE 1 TABLET BY MOUTH  DAILY   MONTELUKAST (SINGULAIR) 10 MG TABLET    Take 1 tablet (10 mg total) by mouth at bedtime.   MULTIPLE VITAMIN (MULTIVITAMIN WITH MINERALS) TABS TABLET    Take 1 tablet by mouth daily. Centrum   PANTOPRAZOLE (PROTONIX) 40 MG TABLET    TAKE 1 TABLET BY MOUTH  DAILY   SILODOSIN (RAPAFLO) 8 MG CAPS CAPSULE    Take 1 capsule (8 mg total) by mouth at bedtime.   VITAMIN C (ASCORBIC ACID) 500 MG TABLET    Take 500 mg 3 (three) times a week by mouth.   Modified Medications   No medications on file  Discontinued Medications   TRAMADOL (ULTRAM) 50 MG TABLET    Take 1 tablet (50 mg total) 3 (three) times daily by mouth.

## 2018-04-19 ENCOUNTER — Other Ambulatory Visit (INDEPENDENT_AMBULATORY_CARE_PROVIDER_SITE_OTHER): Payer: Medicare Other

## 2018-04-19 DIAGNOSIS — F411 Generalized anxiety disorder: Secondary | ICD-10-CM | POA: Diagnosis not present

## 2018-04-19 DIAGNOSIS — Z0000 Encounter for general adult medical examination without abnormal findings: Secondary | ICD-10-CM

## 2018-04-19 DIAGNOSIS — K219 Gastro-esophageal reflux disease without esophagitis: Secondary | ICD-10-CM

## 2018-04-19 DIAGNOSIS — E7800 Pure hypercholesterolemia, unspecified: Secondary | ICD-10-CM

## 2018-04-19 LAB — COMPREHENSIVE METABOLIC PANEL
ALT: 12 U/L (ref 0–53)
AST: 15 U/L (ref 0–37)
Albumin: 4.1 g/dL (ref 3.5–5.2)
Alkaline Phosphatase: 55 U/L (ref 39–117)
BUN: 14 mg/dL (ref 6–23)
CHLORIDE: 98 meq/L (ref 96–112)
CO2: 27 mEq/L (ref 19–32)
Calcium: 9.2 mg/dL (ref 8.4–10.5)
Creatinine, Ser: 1.06 mg/dL (ref 0.40–1.50)
GFR: 72.29 mL/min (ref >60.00)
GLUCOSE: 89 mg/dL (ref 70–99)
POTASSIUM: 4.4 meq/L (ref 3.5–5.1)
Sodium: 134 mEq/L — ABNORMAL LOW (ref 135–145)
Total Bilirubin: 0.5 mg/dL (ref 0.2–1.2)
Total Protein: 6.7 g/dL (ref 6.0–8.3)

## 2018-04-19 LAB — CBC WITH DIFFERENTIAL/PLATELET
BASOS PCT: 1.1 % (ref 0.0–3.0)
Basophils Absolute: 0.1 10*3/uL (ref 0.0–0.1)
EOS PCT: 2 % (ref 0.0–5.0)
Eosinophils Absolute: 0.1 10*3/uL (ref 0.0–0.7)
HCT: 43.9 % (ref 39.0–52.0)
Hemoglobin: 15 g/dL (ref 13.0–17.0)
LYMPHS ABS: 3.1 10*3/uL (ref 0.7–4.0)
Lymphocytes Relative: 44.5 % (ref 12.0–46.0)
MCHC: 34.2 g/dL (ref 30.0–36.0)
MCV: 90.4 fl (ref 78.0–100.0)
Monocytes Absolute: 0.8 10*3/uL (ref 0.1–1.0)
Monocytes Relative: 11.4 % (ref 3.0–12.0)
NEUTROS PCT: 41 % — AB (ref 43.0–77.0)
Neutro Abs: 2.8 10*3/uL (ref 1.4–7.7)
Platelets: 272 10*3/uL (ref 150.0–400.0)
RBC: 4.86 Mil/uL (ref 4.22–5.81)
RDW: 14.2 % (ref 11.5–15.5)
WBC: 7 10*3/uL (ref 4.0–10.5)

## 2018-04-19 LAB — LIPID PANEL
Cholesterol: 130 mg/dL (ref 0–200)
HDL: 37.5 mg/dL — AB (ref >39.00)
LDL Cholesterol: 78 mg/dL (ref 0–99)
NONHDL: 92.37
Total CHOL/HDL Ratio: 3
Triglycerides: 71 mg/dL (ref 0.0–149.0)
VLDL: 14.2 mg/dL (ref 0.0–40.0)

## 2018-04-19 LAB — TSH: TSH: 3.99 u[IU]/mL (ref 0.35–4.50)

## 2018-04-19 LAB — PSA: PSA: 1.77 ng/mL (ref 0.10–4.00)

## 2018-04-25 ENCOUNTER — Encounter: Payer: Self-pay | Admitting: Pulmonary Disease

## 2018-05-03 ENCOUNTER — Telehealth: Payer: Self-pay | Admitting: Pulmonary Disease

## 2018-05-03 NOTE — Telephone Encounter (Signed)
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 OK for surg, no antibiotics needed...  SMN

## 2018-05-03 NOTE — Telephone Encounter (Signed)
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  Pt wants to know if SN is ok with pt proceeding with this procedure.  Pt also wants to know if he should take a prophylactic abx prior to surgery, like he does prior to a dental procedure.    SN please advise.  Thanks!

## 2018-05-24 ENCOUNTER — Telehealth: Payer: Self-pay | Admitting: Internal Medicine

## 2018-05-24 NOTE — Telephone Encounter (Signed)
yes

## 2018-05-24 NOTE — Telephone Encounter (Signed)
Appointment scheduled.

## 2018-05-24 NOTE — Telephone Encounter (Signed)
Copied from [place] [id number]. Topic: Inquiry >> May 24, 2018  1:35 PM Neva Seat wrote: Dr. Lenna Gilford  from 2nd floor Pulmonary is retiring.  He as referred pt to Dr. Ronnald Ramp.  Pt is asking if Dr. Ronnald Ramp would consider him for a new pt?

## 2018-05-24 NOTE — Telephone Encounter (Signed)
Would you be willing to see this patient to establish care?

## 2018-06-05 ENCOUNTER — Encounter: Payer: Self-pay | Admitting: Neurology

## 2018-06-05 ENCOUNTER — Ambulatory Visit (INDEPENDENT_AMBULATORY_CARE_PROVIDER_SITE_OTHER): Payer: Medicare Other | Admitting: Neurology

## 2018-06-05 VITALS — BP 100/60 | HR 61 | Ht 70.0 in | Wt 184.2 lb

## 2018-06-05 DIAGNOSIS — G060 Intracranial abscess and granuloma: Secondary | ICD-10-CM | POA: Diagnosis not present

## 2018-06-05 DIAGNOSIS — G620 Drug-induced polyneuropathy: Secondary | ICD-10-CM | POA: Diagnosis not present

## 2018-06-05 NOTE — Patient Instructions (Signed)
Return to clinic in 1 year.

## 2018-06-05 NOTE — Progress Notes (Signed)
Follow-up Visit   Date: 06/05/18   Gregory [name] MRN: [id number] DOB: July 13, 1941   Interim History: Gregory [name] is a 77 y.o. right-handed Caucasian male with hypothyroidism, hyperlipidemia, Osler-Weber- Rendu disease, GERD, and left external basal ganglia polymicrobial abscess (2016) returning to the clinic for follow-up of right sided spasticity and neuropathy.   The patient was accompanied to the clinic by self.  History of present illness: He was hospitalized from May 24 - May 21 2015 with right sided leg weakness and headaches. Patient was found to have a biopsy-proven brain abscess 3 x 2 cm involving the basal ganglia with midline shift and edema seen on CT head. IV antibiotics including vancomycin, rocephin, and flagyl was started. He was also started on keppra 500mg  BID. Of note, he had routine dental work 2 weeks prior to symptom onset.  He has completed his course of antibiotics therapy with IV for 6 weeks and transitioned to oral levofloxacin and metronidazole for 4 weeks.  Starting ~ late July 2016, he started noticing numbness/tingling of the toes and shooting pain involving the sole of the foot. Symptoms are constant and involve both feet equally. Shooting pain is worse with weight bearing. Nothing alleviates the pain. He endorses mild low back pain.  These symptoms were thought to be due to flagyl-induced neuropathy and slowly improved.   UPDATE 06/23/2017:  He is here for 6 month appointment.  He developed right shoulder and neck pain and was started on robaxin for muscle stiffness and noticed that this has helped with his right arm tightness.  He also completed 4 weeks for neck PT which helped. Overall, he feels well and has not noticed any new neurological symptoms. He continues to have numbness of the toes but sensation is slightly better than before.  He is being treated for iron deficiency anemia which is new.   UPDATE 06/05/2018:  He is here for 1 year  follow-up visit. Overall, he is doing well without and the only thing that he has noticed is mild imbalance.  Earlier this year, he suffered a fall tripping in the garden.  He continues to walk unassisted and stays active.  He has mild numbness in the feet, worse on the right.  No new weakness.  Over the past 6 months, he has noticed increased problems with forgetting people's names.  If he focuses, the name will come to him in 15 minutes.  He does not have problems with word-finding, managing medications, finances, or driving.     Medications:  Current Outpatient Medications on File Prior to Visit  Medication Sig Dispense Refill  . acetaminophen (TYLENOL) 325 MG tablet Take 650 mg by mouth daily as needed (pain).    [name] amoxicillin (AMOXIL) 500 MG capsule TAKE 4 CAPSULES 1 HOUR PRIOR TO PROCEDURE 4 capsule 4  . Cholecalciferol (VITAMIN D3) 2000 UNITS TABS Take 2,000 Units by mouth daily.    . clidinium-chlordiazePOXIDE (LIBRAX) 5-2.5 MG capsule Take 1 capsule by mouth 3 (three) times daily as needed. 60 capsule 5  . clorazepate (TRANXENE) 7.5 MG tablet TAKE 1 TABLET 3 TIMES A DAY AS NEEDED 90 tablet 5  . Diphenhyd-Hydrocort-Nystatin (FIRST-DUKES MOUTHWASH) SUSP 1 tsp gargle and swallow four times daily 120 mL 5  . FERROUS SULFATE PO Take 65 mg by mouth 3 (three) times a week. Mon, wed, Fri    . hydrocortisone (ANUSOL-HC) 2.5 % rectal cream Use as directed. 30 g 1  . levocetirizine (XYZAL) 5 MG tablet Take  5 mg by mouth every evening.    [name] levothyroxine (SYNTHROID, LEVOTHROID) 125 MCG tablet TAKE 1 TABLET BY MOUTH  DAILY BEFORE BREAKFAST 90 tablet 3  . Magnesium 250 MG TABS Take 250 mg by mouth at bedtime.     . meclizine (ANTIVERT) 25 MG tablet Take 1 tablet (25 mg total) by mouth daily as needed for dizziness. 30 tablet 5  . metaxalone (SKELAXIN) 800 MG tablet Take 1 tablet (800 mg total) by mouth 3 (three) times daily. 90 tablet 1  . metoprolol succinate (TOPROL-XL) 25 MG 24 hr tablet TAKE 1  TABLET BY MOUTH  DAILY (Patient taking differently: TAKE 0.5  TABLET BY MOUTH  DAILY) 90 tablet 3  . montelukast (SINGULAIR) 10 MG tablet Take 1 tablet (10 mg total) by mouth at bedtime. 30 tablet 5  . Multiple Vitamin (MULTIVITAMIN WITH MINERALS) TABS tablet Take 1 tablet by mouth daily. Centrum    . pantoprazole (PROTONIX) 40 MG tablet TAKE 1 TABLET BY MOUTH  DAILY 90 tablet 3  . silodosin (RAPAFLO) 8 MG CAPS capsule Take 1 capsule (8 mg total) by mouth at bedtime. 90 capsule 3  . vitamin C (ASCORBIC ACID) 500 MG tablet Take 500 mg 3 (three) times a week by mouth.      No current facility-administered medications on file prior to visit.     Allergies:  Allergies  Allergen Reactions  . Nsaids Other (See Comments)    Cannot take-per MD  . Other Other (See Comments)    All blood thinners-cannot take per MD  . Aspirin Other (See Comments)    nosebleeds  . Statins Other (See Comments)    Weakness, myalgias    Review of Systems:  CONSTITUTIONAL: No fevers, chills, night sweats, or weight loss.  EYES: +visual changes or eye pain ENT: No hearing changes.  No history of nose bleeds.   RESPIRATORY: No cough, wheezing and shortness of breath.   CARDIOVASCULAR: Negative for chest pain, and palpitations.   GI: Negative for abdominal discomfort, blood in stools or black stools.  No recent change in bowel habits.   GU:  No history of incontinence.   MUSCLOSKELETAL: No history of joint pain or swelling.  No myalgias.   SKIN: Negative for lesions, rash, and itching.   ENDOCRINE: Negative for cold or heat intolerance, polydipsia or goiter.   PSYCH:  No depression or anxiety symptoms.   NEURO: As Above.   Vital Signs:  BP 100/60   Pulse 61   Ht 5\' 10"  (1.778 m)   Wt 184 lb 4 oz (83.6 kg)   SpO2 92%   BMI 26.44 kg/m   General Medical Exam:   General:  Well appearing, comfortable  Eyes/ENT: see cranial nerve examination.   Neck: No masses appreciated.  Full range of motion without  tenderness.  No carotid bruits. Respiratory:  Clear to auscultation, good air entry bilaterally.   Cardiac:  Regular rate and rhythm, no murmur.   Ext:  No edema  Neurological Exam: MENTAL STATUS including orientation to time, place, person, recent and remote memory, attention span and concentration, language, and fund of knowledge is normal.  Speech is not dysarthric.  CRANIAL NERVES:  Normal fundoscopic exam.  Pupils equal round and reactive to light.  Normal conjugate, extra-ocular eye movements in all directions of gaze.  Right ptosis (old).  Face is symmetric and muscles are intact. Palate elevates symmetrically.  Tongue is midline.  MOTOR:  Motor strength is 5/5 in all extremities.  No pronator drift.  There is mild spasticity in the RUE and RLE (0+)  MSRs:  Reflexes are 3+/4 throughout, brisker on the right side.  SENSORY:  Vibration is reduced at the ankles (75%), trace at the great toe, intact at the knees.    COORDINATION/GAIT:  Normal finger-to- nose-finger. Intact rapid alternating movements bilaterally.  Gait narrow based and stable.  Tandem and stressed gait intact.   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  Stable 4 mm LEFT-to-RIGHT midline shift.  CT head 06/24/2015:  Significant interval improvement when compared to the 05/13/2015 examination.  The ring-enhancing lesion which was centered in the left basal ganglia has decreased significantly in size. Currently, 2.3 x 0.9 cm region of relatively solid enhancement superior aspect of the left basal ganglia remains as versus prior 3.1 x 1.9 cm ring-enhancing lesion. Significant decrease in surrounding vasogenic edema and marked decrease mass effect upon the left lateral  ventricle. What remains may represent residua of treated abscess. This will need to be followup until complete clearance. As this clears it would be helpful to exclude the possibility of underlying vascular abnormality in this patient with Osler-Weber-Rendu.  CT head 08/11/2015: Resolving abscess left external capsule. There is decreased edema and decreased enhancement in this area. No residual fluid collection. No other areas of acute abnormality.  CT head wwo contrast 01/06/2016:  Hypoattenuation in the LEFT basal ganglia and regional white matter is improved from August but not yet normalized. No abnormal postcontrast enhancement to definitively suggest residual infection. Continued surveillance may be warranted, given the patient's residual RIGHT leg weakness and intermittent slurred speech.  NCS/EMG of the legs 09/16/2015: 1. The electrophysiologic findings are most consistent with a generalized sensorimotor polyneuropathy, axon loss in type, affecting the lower extremities. Overall, these findings are severe in degree electrically with respect to sensory responses. 2. 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  Labs 09/11/2015:  vitamin B12 874, folate 24.8, vitamin B1 36, copper 77, vitamin B6 73.8, myasthenia panel negative  CT head 01/21/2017:  No change from the prior CT. Negative for abscess or mass lesion. Chronic encephalomalacia left external capsule related to prior cerebral abscess.  IMPRESSION/PLAN: 1. Right arm and leg spasticity from his previously treated left basal ganglia polymicrobial abscess (May 2016). Spasticity is very mild and does not interfere with daily activities. Exam shows improved tone today.  He remains on a combination of robaxin and skalaxin, prescribed by his orthopeadic and primary care doctor.  Continue home stretching exercises.  2.  Drug-induced neuropathy, stable and mild.  3.  Memory change  with forgetting names.  No signs of dementia.  Cognitive screening declined today and will consider, if any new cognitive symptoms arise.  Return to clinic in 1 year  Greater than 50% of this 20 minute visit was spent in counseling, explanation of diagnosis, planning of further management, and coordination of care.   Thank you for allowing me to participate in patient's care.  If I can answer any additional questions, I would be pleased to do so.    Sincerely,    Donika K. Posey Pronto, DO

## 2018-06-26 ENCOUNTER — Telehealth: Payer: Self-pay | Admitting: Pulmonary Disease

## 2018-06-26 NOTE — Telephone Encounter (Signed)
Called and spoke with pt who stated to me he had a new pcp visit with Dr. Ronnald Ramp and wanted to make sure that was one of the doctors that was recommended by SN.  Spoke with Dr. Lenna Gilford who said Dr. Ronnald Ramp with [facility] would be fine for pt to see.  Stated to pt that the appt with Dr. Ronnald Ramp was fine. Pt expressed understanding. Nothing further needed.

## 2018-06-27 ENCOUNTER — Encounter: Payer: Self-pay | Admitting: Internal Medicine

## 2018-06-27 ENCOUNTER — Ambulatory Visit (INDEPENDENT_AMBULATORY_CARE_PROVIDER_SITE_OTHER): Payer: Medicare Other | Admitting: Internal Medicine

## 2018-06-27 VITALS — BP 136/74 | HR 60 | Temp 98.0°F | Resp 16 | Ht 70.0 in | Wt 184.2 lb

## 2018-06-27 DIAGNOSIS — I4892 Unspecified atrial flutter: Secondary | ICD-10-CM | POA: Diagnosis not present

## 2018-06-27 DIAGNOSIS — E039 Hypothyroidism, unspecified: Secondary | ICD-10-CM

## 2018-06-27 DIAGNOSIS — Z23 Encounter for immunization: Secondary | ICD-10-CM

## 2018-06-27 DIAGNOSIS — J301 Allergic rhinitis due to pollen: Secondary | ICD-10-CM

## 2018-06-27 MED ORDER — FLUTICASONE PROPIONATE 50 MCG/ACT NA SUSP: 2.0000 | Freq: Every day | NASAL | 1 refills | Status: DC

## 2018-06-27 MED ORDER — LEVOCETIRIZINE DIHYDROCHLORIDE 5 MG PO TABS: 5.0000 mg | ORAL_TABLET | Freq: Every evening | ORAL | 1 refills | Status: AC

## 2018-06-27 NOTE — Progress Notes (Signed)
Subjective:  Patient ID: Gregory [name], male    DOB: 11/16/1941  Age: 77 y.o. MRN: [id number]  CC: Hypothyroidism; Anemia; and Allergic Rhinitis   NEW TO ME  HPI Gregory [name] presents for concerns about a several week history of nasal congestion, runny nose, postnasal drip, and constant throat clearing.  He is not treating this with any of his previously prescribed medications.  His previous physician is retiring so he needs to find a new PCP.  He had lab work done about 2 months ago that was unremarkable.  Outpatient Medications Prior to Visit  Medication Sig Dispense Refill  . Cholecalciferol (VITAMIN D3) 2000 UNITS TABS Take 2,000 Units by mouth daily.    . clorazepate (TRANXENE) 7.5 MG tablet TAKE 1 TABLET 3 TIMES A DAY AS NEEDED 90 tablet 5  . FERROUS SULFATE PO Take 65 mg by mouth 3 (three) times a week. Mon, wed, Fri    . levothyroxine (SYNTHROID, LEVOTHROID) 125 MCG tablet TAKE 1 TABLET BY MOUTH  DAILY BEFORE BREAKFAST 90 tablet 3  . Magnesium 250 MG TABS Take 250 mg by mouth at bedtime.     . meclizine (ANTIVERT) 25 MG tablet Take 1 tablet (25 mg total) by mouth daily as needed for dizziness. 30 tablet 5  . metoprolol succinate (TOPROL-XL) 25 MG 24 hr tablet TAKE 1 TABLET BY MOUTH  DAILY (Patient taking differently: TAKE 0.5  TABLET BY MOUTH  DAILY) 90 tablet 3  . montelukast (SINGULAIR) 10 MG tablet Take 1 tablet (10 mg total) by mouth at bedtime. 30 tablet 5  . pantoprazole (PROTONIX) 40 MG tablet TAKE 1 TABLET BY MOUTH  DAILY 90 tablet 3  . silodosin (RAPAFLO) 8 MG CAPS capsule Take 1 capsule (8 mg total) by mouth at bedtime. 90 capsule 3  . vitamin C (ASCORBIC ACID) 500 MG tablet Take 500 mg 3 (three) times a week by mouth.     [name] acetaminophen (TYLENOL) 325 MG tablet Take 650 mg by mouth daily as needed (pain).    . clidinium-chlordiazePOXIDE (LIBRAX) 5-2.5 MG capsule Take 1 capsule by mouth 3 (three) times daily as needed. 60 capsule 5  . Diphenhyd-Hydrocort-Nystatin  (FIRST-DUKES MOUTHWASH) SUSP 1 tsp gargle and swallow four times daily 120 mL 5  . hydrocortisone (ANUSOL-HC) 2.5 % rectal cream Use as directed. 30 g 1  . levocetirizine (XYZAL) 5 MG tablet Take 5 mg by mouth every evening.    . metaxalone (SKELAXIN) 800 MG tablet Take 1 tablet (800 mg total) by mouth 3 (three) times daily. 90 tablet 1  . Multiple Vitamin (MULTIVITAMIN WITH MINERALS) TABS tablet Take 1 tablet by mouth daily. Centrum    . amoxicillin (AMOXIL) 500 MG capsule TAKE 4 CAPSULES 1 HOUR PRIOR TO PROCEDURE (Patient not taking: Reported on 06/27/2018) 4 capsule 4   No facility-administered medications prior to visit.     ROS Review of Systems  Constitutional: Negative.  Negative for chills, diaphoresis, fatigue and fever.  HENT: Positive for congestion, postnasal drip and rhinorrhea. Negative for facial swelling, nosebleeds, sinus pressure, sneezing, sore throat, trouble swallowing and voice change.   Eyes: Negative.   Respiratory: Negative.  Negative for cough, chest tightness, shortness of breath and wheezing.   Gastrointestinal: Negative for abdominal pain, constipation, diarrhea, nausea and vomiting.  Endocrine: Negative.  Negative for cold intolerance and heat intolerance.  Genitourinary: Negative.  Negative for difficulty urinating, dysuria, hematuria and urgency.  Musculoskeletal: Negative.  Negative for arthralgias and myalgias.  Skin: Negative.  Negative for color change and rash.  Allergic/Immunologic: Negative.   Neurological: Negative.  Negative for dizziness.  Hematological: Negative for adenopathy. Does not bruise/bleed easily.  Psychiatric/Behavioral: Negative.     Objective:  BP 136/74 (BP Location: Left Arm, Patient Position: Sitting, Cuff Size: Normal)   Pulse 60   Temp 98 F (36.7 C) (Oral)   Resp 16   Ht 5\' 10"  (1.778 m)   Wt 184 lb 4 oz (83.6 kg)   SpO2 93%   BMI 26.44 kg/m   BP Readings from Last 3 Encounters:  06/27/18 136/74  06/05/18 100/60    04/17/18 120/62    Wt Readings from Last 3 Encounters:  06/27/18 184 lb 4 oz (83.6 kg)  06/05/18 184 lb 4 oz (83.6 kg)  04/17/18 186 lb 12.8 oz (84.7 kg)    Physical Exam  Constitutional: He is oriented to person, place, and time. No distress.  HENT:  Nose: Mucosal edema and rhinorrhea present. No sinus tenderness, nasal deformity or nasal septal hematoma. No epistaxis. Right sinus exhibits no maxillary sinus tenderness and no frontal sinus tenderness. Left sinus exhibits no maxillary sinus tenderness and no frontal sinus tenderness.  Mouth/Throat: Oropharynx is clear and moist. No oropharyngeal exudate.  Multiple telangiectasias noted around the lips, tongue, and mouth.  There is no bleeding.  Eyes: Conjunctivae are normal. No scleral icterus.  Neck: Normal range of motion. Neck supple. No JVD present. No thyromegaly present.  Cardiovascular: Normal rate, regular rhythm and normal heart sounds.  Pulmonary/Chest: Effort normal and breath sounds normal. No respiratory distress. He has no wheezes. He has no rales.  Abdominal: Soft. Normal appearance and bowel sounds are normal. He exhibits no mass. There is no hepatosplenomegaly. There is no tenderness. No hernia.  Musculoskeletal: Normal range of motion. He exhibits no edema, tenderness or deformity.  Lymphadenopathy:    He has no cervical adenopathy.  Neurological: He is alert and oriented to person, place, and time.  Skin: Skin is warm and dry. No rash noted. He is not diaphoretic.  Vitals reviewed.   Lab Results  Component Value Date   WBC 7.0 04/19/2018   HGB 15.0 04/19/2018   HCT 43.9 04/19/2018   PLT 272.0 04/19/2018   GLUCOSE 89 04/19/2018   CHOL 130 04/19/2018   TRIG 71.0 04/19/2018   HDL 37.50 (L) 04/19/2018   LDLDIRECT 139.4 06/15/2007   LDLCALC 78 04/19/2018   ALT 12 04/19/2018   AST 15 04/19/2018   NA 134 (L) 04/19/2018   K 4.4 04/19/2018   CL 98 04/19/2018   CREATININE 1.06 04/19/2018   BUN 14 04/19/2018    CO2 27 04/19/2018   TSH 3.99 04/19/2018   PSA 1.77 04/19/2018   INR 1.16 05/13/2015   HGBA1C 5.7 07/31/2015    Dg Chest 2 View  Result Date: 04/17/2018 CLINICAL DATA:  Routine examination. History of Osler Weber Rendu disease. Asymptomatic. EXAM: CHEST - 2 VIEW COMPARISON:  Chest x-ray dated October 25, 2016. FINDINGS: The heart size and mediastinal contours are within normal limits. Normal pulmonary vascularity. No focal consolidation, pleural effusion, or pneumothorax. No acute osseous abnormality. IMPRESSION: No active cardiopulmonary disease. Electronically Signed   By: Titus Dubin M.D.   On: 04/17/2018 15:41    Assessment & Plan:   Nickolis was seen today for hypothyroidism, anemia and allergic rhinitis .  Diagnoses and all orders for this visit:  Seasonal allergic rhinitis due to pollen -     levocetirizine (XYZAL) 5 MG tablet; Take 1  tablet (5 mg total) by mouth every evening. -     fluticasone (FLONASE) 50 MCG/ACT nasal spray; Place 2 sprays into both nostrils daily.  Atrial flutter, unspecified type (HCC)-he had an episode of atrial flutter many years ago when he was in the midst of being treated for brain abscess.  He said no more palpitations since then.  He has a history of Osler-Weber-Rendu so anticoagulant is contraindicated.  Acquired hypothyroidism- His recent TSH was in the normal range.  He will remain on the current dose of levothyroxine.  Need for pneumococcal vaccination -     Pneumococcal polysaccharide vaccine 23-valent greater than or equal to 2yo subcutaneous/IM   I have discontinued [name] "[name]"'s acetaminophen, multivitamin with minerals, FIRST-DUKES MOUTHWASH, metaxalone, clidinium-chlordiazePOXIDE, and hydrocortisone. I have also changed his levocetirizine. Additionally, I am having him start on fluticasone. Lastly, I am having him maintain his Magnesium, Vitamin D3, amoxicillin, FERROUS SULFATE PO, vitamin C, metoprolol succinate,  levothyroxine, pantoprazole, meclizine, montelukast, silodosin, and clorazepate.  Meds ordered this encounter  Medications  . levocetirizine (XYZAL) 5 MG tablet    Sig: Take 1 tablet (5 mg total) by mouth every evening.    Dispense:  90 tablet    Refill:  1  . fluticasone (FLONASE) 50 MCG/ACT nasal spray    Sig: Place 2 sprays into both nostrils daily.    Dispense:  48 g    Refill:  1     Follow-up: Return in about 6 months (around 12/28/2018).  Scarlette Calico, MD

## 2018-06-27 NOTE — Patient Instructions (Signed)
Hypothyroidism Hypothyroidism is a disorder of the thyroid. The thyroid is a large gland that is located in the lower front of the neck. The thyroid releases hormones that control how the body works. With hypothyroidism, the thyroid does not make enough of these hormones. What are the causes? Causes of hypothyroidism may include:  Viral infections.  Pregnancy.  Your own defense system (immune system) attacking your thyroid.  Certain medicines.  Birth defects.  Past radiation treatments to your head or neck.  Past treatment with radioactive iodine.  Past surgical removal of part or all of your thyroid.  Problems with the gland that is located in the center of your brain (pituitary).  What are the signs or symptoms? Signs and symptoms of hypothyroidism may include:  Feeling as though you have no energy (lethargy).  Inability to tolerate cold.  Weight gain that is not explained by a change in diet or exercise habits.  Dry skin.  Coarse hair.  Menstrual irregularity.  Slowing of thought processes.  Constipation.  Sadness or depression.  How is this diagnosed? Your health care provider may diagnose hypothyroidism with blood tests and ultrasound tests. How is this treated? Hypothyroidism is treated with medicine that replaces the hormones that your body does not make. After you begin treatment, it may take several weeks for symptoms to go away. Follow these instructions at home:  Take medicines only as directed by your health care provider.  If you start taking any new medicines, tell your health care provider.  Keep all follow-up visits as directed by your health care provider. This is important. As your condition improves, your dosage needs may change. You will need to have blood tests regularly so that your health care provider can watch your condition. Contact a health care provider if:  Your symptoms do not get better with treatment.  You are taking thyroid  replacement medicine and: ? You sweat excessively. ? You have tremors. ? You feel anxious. ? You lose weight rapidly. ? You cannot tolerate heat. ? You have emotional swings. ? You have diarrhea. ? You feel weak. Get help right away if:  You develop chest pain.  You develop an irregular heartbeat.  You develop a rapid heartbeat. This information is not intended to replace advice given to you by your health care provider. Make sure you discuss any questions you have with your health care provider. Document Released: 12/06/2005 Document Revised: 05/13/2016 Document Reviewed: 04/23/2014 Elsevier Interactive Patient Education  2018 [employer] Inc[employer]  

## 2018-06-28 ENCOUNTER — Ambulatory Visit: Payer: Medicare Other | Admitting: Neurology

## 2018-07-11 DIAGNOSIS — M25551 Pain in right hip: Secondary | ICD-10-CM | POA: Insufficient documentation

## 2018-07-11 HISTORY — DX: Pain in right hip: M25.551

## 2018-08-11 ENCOUNTER — Other Ambulatory Visit: Payer: Self-pay | Admitting: Pulmonary Disease

## 2018-08-25 ENCOUNTER — Encounter: Payer: Self-pay | Admitting: Neurology

## 2018-10-03 ENCOUNTER — Ambulatory Visit (INDEPENDENT_AMBULATORY_CARE_PROVIDER_SITE_OTHER): Payer: Medicare Other | Admitting: *Deleted

## 2018-10-03 DIAGNOSIS — Z23 Encounter for immunization: Secondary | ICD-10-CM | POA: Diagnosis not present

## 2018-10-18 ENCOUNTER — Other Ambulatory Visit: Payer: Self-pay | Admitting: Pulmonary Disease

## 2018-10-18 ENCOUNTER — Ambulatory Visit (INDEPENDENT_AMBULATORY_CARE_PROVIDER_SITE_OTHER): Payer: Medicare Other | Admitting: Pulmonary Disease

## 2018-10-18 ENCOUNTER — Other Ambulatory Visit (INDEPENDENT_AMBULATORY_CARE_PROVIDER_SITE_OTHER): Payer: Medicare Other

## 2018-10-18 VITALS — BP 128/62 | HR 65 | Temp 97.5°F | Ht 70.0 in | Wt 180.8 lb

## 2018-10-18 DIAGNOSIS — G609 Hereditary and idiopathic neuropathy, unspecified: Secondary | ICD-10-CM

## 2018-10-18 DIAGNOSIS — J3489 Other specified disorders of nose and nasal sinuses: Secondary | ICD-10-CM

## 2018-10-18 DIAGNOSIS — K219 Gastro-esophageal reflux disease without esophagitis: Secondary | ICD-10-CM

## 2018-10-18 DIAGNOSIS — I483 Typical atrial flutter: Secondary | ICD-10-CM

## 2018-10-18 DIAGNOSIS — I780 Hereditary hemorrhagic telangiectasia: Secondary | ICD-10-CM

## 2018-10-18 DIAGNOSIS — F411 Generalized anxiety disorder: Secondary | ICD-10-CM

## 2018-10-18 DIAGNOSIS — M545 Low back pain: Secondary | ICD-10-CM

## 2018-10-18 DIAGNOSIS — M159 Polyosteoarthritis, unspecified: Secondary | ICD-10-CM

## 2018-10-18 DIAGNOSIS — M150 Primary generalized (osteo)arthritis: Secondary | ICD-10-CM

## 2018-10-18 DIAGNOSIS — G8929 Other chronic pain: Secondary | ICD-10-CM

## 2018-10-18 DIAGNOSIS — E7800 Pure hypercholesterolemia, unspecified: Secondary | ICD-10-CM

## 2018-10-18 DIAGNOSIS — G060 Intracranial abscess and granuloma: Secondary | ICD-10-CM

## 2018-10-18 LAB — CBC WITH DIFFERENTIAL/PLATELET
BASOS ABS: 0 10*3/uL (ref 0.0–0.1)
Basophils Relative: 0.5 % (ref 0.0–3.0)
EOS ABS: 0.1 10*3/uL (ref 0.0–0.7)
Eosinophils Relative: 1.4 % (ref 0.0–5.0)
HCT: 46.6 % (ref 39.0–52.0)
Hemoglobin: 16.3 g/dL (ref 13.0–17.0)
LYMPHS ABS: 2.7 10*3/uL (ref 0.7–4.0)
Lymphocytes Relative: 29.2 % (ref 12.0–46.0)
MCHC: 34.9 g/dL (ref 30.0–36.0)
MCV: 90.4 fl (ref 78.0–100.0)
Monocytes Absolute: 1.1 10*3/uL — ABNORMAL HIGH (ref 0.1–1.0)
Monocytes Relative: 12 % (ref 3.0–12.0)
NEUTROS ABS: 5.2 10*3/uL (ref 1.4–7.7)
NEUTROS PCT: 56.9 % (ref 43.0–77.0)
PLATELETS: 276 10*3/uL (ref 150.0–400.0)
RBC: 5.16 Mil/uL (ref 4.22–5.81)
RDW: 13.9 % (ref 11.5–15.5)
WBC: 9.2 10*3/uL (ref 4.0–10.5)

## 2018-10-18 LAB — IRON,TIBC AND FERRITIN PANEL
%SAT: 19 % — AB (ref 20–48)
Ferritin: 43 ng/mL (ref 24–380)
Iron: 63 ug/dL (ref 50–180)
TIBC: 333 mcg/dL (calc) (ref 250–425)

## 2018-10-18 MED ORDER — SILODOSIN 8 MG PO CAPS: 8.0000 mg | ORAL_CAPSULE | Freq: Every day | ORAL | 2 refills | Status: DC

## 2018-10-18 MED ORDER — MONTELUKAST SODIUM 10 MG PO TABS: ORAL_TABLET | ORAL | 1 refills | Status: DC

## 2018-10-18 MED ORDER — METOPROLOL SUCCINATE ER 25 MG PO TB24: ORAL_TABLET | ORAL | 2 refills | Status: DC

## 2018-10-18 MED ORDER — LEVOTHYROXINE SODIUM 125 MCG PO TABS: ORAL_TABLET | ORAL | 2 refills | Status: DC

## 2018-10-18 MED ORDER — AMOXICILLIN 500 MG PO CAPS: ORAL_CAPSULE | ORAL | 2 refills | Status: DC

## 2018-10-18 MED ORDER — METAXALONE 800 MG PO TABS: 800.0000 mg | ORAL_TABLET | ORAL | 5 refills | Status: DC | PRN

## 2018-10-18 MED ORDER — PANTOPRAZOLE SODIUM 40 MG PO TBEC: 40.0000 mg | DELAYED_RELEASE_TABLET | Freq: Every day | ORAL | 2 refills | Status: DC

## 2018-10-18 MED ORDER — MECLIZINE HCL 25 MG PO TABS: 25.0000 mg | ORAL_TABLET | Freq: Every day | ORAL | 5 refills | Status: DC | PRN

## 2018-10-18 NOTE — Patient Instructions (Addendum)
Today we updated your med list in our EPIC system...    Continue your current medications the same...  Today we rechecked your blood count & iron level...    We will contact you w/ the results when available...   We refilled your meds per request...   You are up-to-date on your vaccinations as well...  Gregory [name],  It has been my great honor to have been one of your doctors over these last many years!    Sending you and [name] my very best wishes for good health & much happiness in the years to come!!!

## 2018-10-20 NOTE — Progress Notes (Signed)
LMTCB

## 2018-10-23 ENCOUNTER — Telehealth: Payer: Self-pay | Admitting: Pulmonary Disease

## 2018-10-23 MED ORDER — FIRST-DUKES MOUTHWASH MT SUSP: 5.0000 mL | Freq: Three times a day (TID) | OROMUCOSAL | 0 refills | Status: DC | PRN

## 2018-10-23 NOTE — Telephone Encounter (Signed)
Per SN- Ok for Magic mouthwash.  Patient notified of prescription.  Prescription sent to preferred pharmacy, [place].   Nothing further at this time.

## 2018-10-23 NOTE — Telephone Encounter (Signed)
Called and spoke with patient he is aware of results and verbalized understanding. While on the phone with the patient he stated that he had a sore throat and is requesting magic mouth wash to be called in.   SN please advise, thank you.

## 2018-11-14 ENCOUNTER — Ambulatory Visit (INDEPENDENT_AMBULATORY_CARE_PROVIDER_SITE_OTHER): Payer: Medicare Other | Admitting: Internal Medicine

## 2018-11-14 ENCOUNTER — Encounter: Payer: Self-pay | Admitting: Internal Medicine

## 2018-11-14 ENCOUNTER — Telehealth: Payer: Self-pay | Admitting: Internal Medicine

## 2018-11-14 ENCOUNTER — Ambulatory Visit (INDEPENDENT_AMBULATORY_CARE_PROVIDER_SITE_OTHER)
Admission: RE | Admit: 2018-11-14 | Discharge: 2018-11-14 | Disposition: A | Discharge status: Home / Self Care | Payer: Medicare Other | Source: Ambulatory Visit | Attending: Internal Medicine | Admitting: Internal Medicine

## 2018-11-14 ENCOUNTER — Other Ambulatory Visit (INDEPENDENT_AMBULATORY_CARE_PROVIDER_SITE_OTHER): Payer: Medicare Other

## 2018-11-14 VITALS — BP 124/80 | HR 70 | Temp 97.6°F | Ht 70.0 in | Wt 181.5 lb

## 2018-11-14 DIAGNOSIS — D500 Iron deficiency anemia secondary to blood loss (chronic): Secondary | ICD-10-CM

## 2018-11-14 DIAGNOSIS — R10817 Generalized abdominal tenderness: Secondary | ICD-10-CM

## 2018-11-14 DIAGNOSIS — J4530 Mild persistent asthma, uncomplicated: Secondary | ICD-10-CM

## 2018-11-14 DIAGNOSIS — R05 Cough: Secondary | ICD-10-CM

## 2018-11-14 DIAGNOSIS — R9389 Abnormal findings on diagnostic imaging of other specified body structures: Secondary | ICD-10-CM

## 2018-11-14 DIAGNOSIS — E039 Hypothyroidism, unspecified: Secondary | ICD-10-CM

## 2018-11-14 DIAGNOSIS — R059 Cough, unspecified: Secondary | ICD-10-CM

## 2018-11-14 DIAGNOSIS — R634 Abnormal weight loss: Secondary | ICD-10-CM

## 2018-11-14 LAB — CBC WITH DIFFERENTIAL/PLATELET
Basophils Absolute: 0.1 10*3/uL (ref 0.0–0.1)
Basophils Relative: 1.2 % (ref 0.0–3.0)
EOS PCT: 1.8 % (ref 0.0–5.0)
Eosinophils Absolute: 0.1 10*3/uL (ref 0.0–0.7)
HCT: 45.6 % (ref 39.0–52.0)
Hemoglobin: 15.6 g/dL (ref 13.0–17.0)
LYMPHS ABS: 2.1 10*3/uL (ref 0.7–4.0)
Lymphocytes Relative: 29.1 % (ref 12.0–46.0)
MCHC: 34.2 g/dL (ref 30.0–36.0)
MCV: 91.1 fl (ref 78.0–100.0)
MONO ABS: 0.7 10*3/uL (ref 0.1–1.0)
MONOS PCT: 10.5 % (ref 3.0–12.0)
NEUTROS ABS: 4.1 10*3/uL (ref 1.4–7.7)
NEUTROS PCT: 57.4 % (ref 43.0–77.0)
PLATELETS: 304 10*3/uL (ref 150.0–400.0)
RBC: 5 Mil/uL (ref 4.22–5.81)
RDW: 13.6 % (ref 11.5–15.5)
WBC: 7.2 10*3/uL (ref 4.0–10.5)

## 2018-11-14 LAB — COMPREHENSIVE METABOLIC PANEL
ALT: 17 U/L (ref 0–53)
AST: 15 U/L (ref 0–37)
Albumin: 4.2 g/dL (ref 3.5–5.2)
Alkaline Phosphatase: 64 U/L (ref 39–117)
BILIRUBIN TOTAL: 0.4 mg/dL (ref 0.2–1.2)
BUN: 14 mg/dL (ref 6–23)
CO2: 30 meq/L (ref 19–32)
Calcium: 9.3 mg/dL (ref 8.4–10.5)
Chloride: 95 mEq/L — ABNORMAL LOW (ref 96–112)
Creatinine, Ser: 0.98 mg/dL (ref 0.40–1.50)
GFR: 79.02 mL/min (ref >60.00)
GLUCOSE: 83 mg/dL (ref 70–99)
Potassium: 4.1 mEq/L (ref 3.5–5.1)
SODIUM: 132 meq/L — AB (ref 135–145)
Total Protein: 7.1 g/dL (ref 6.0–8.3)

## 2018-11-14 LAB — URINALYSIS, ROUTINE W REFLEX MICROSCOPIC
BILIRUBIN URINE: NEGATIVE
Hgb urine dipstick: NEGATIVE
Ketones, ur: NEGATIVE
LEUKOCYTES UA: NEGATIVE
Nitrite: NEGATIVE
PH: 6.5 (ref 5.0–8.0)
RBC / HPF: NONE SEEN (ref 0–?)
Specific Gravity, Urine: 1.01 (ref 1.000–1.030)
Total Protein, Urine: NEGATIVE
Urine Glucose: NEGATIVE
Urobilinogen, UA: 0.2 (ref 0.0–1.0)

## 2018-11-14 LAB — LIPASE

## 2018-11-14 LAB — AMYLASE: Amylase: 46 U/L (ref 27–131)

## 2018-11-14 LAB — TSH: TSH: 5.7 u[IU]/mL — ABNORMAL HIGH (ref 0.35–4.50)

## 2018-11-14 LAB — POCT EXHALED NITRIC OXIDE: FENO LEVEL (PPB): 19

## 2018-11-14 MED ORDER — METHYLPREDNISOLONE 4 MG PO TBPK: ORAL_TABLET | ORAL | 0 refills | Status: AC

## 2018-11-14 NOTE — Progress Notes (Signed)
Subjective:  Patient ID: Gregory [name], male    DOB: 08/06/1941  Age: 77 y.o. MRN: [id number]  CC: Abdominal Pain and Cough   HPI [name] presents for a 6 week hx of nonproductive cough, wheezing, night sweats, mild weight loss, and intermittent/diffuse abdominal pain.  His inhalers are not helping with his respiratory complaints.  Outpatient Medications Prior to Visit  Medication Sig Dispense Refill  . albuterol (PROAIR HFA) 108 (90 Base) MCG/ACT inhaler Inhale 1-2 puffs into the lungs every 6 (six) hours as needed for wheezing or shortness of breath.    . Cholecalciferol (VITAMIN D3) 2000 UNITS TABS Take 2,000 Units by mouth daily.    . clorazepate (TRANXENE) 7.5 MG tablet TAKE 1 TABLET 3 TIMES A DAY AS NEEDED 90 tablet 5  . FERROUS SULFATE PO Take 65 mg by mouth 3 (three) times a week. Mon, wed, Fri    . fluticasone (FLONASE) 50 MCG/ACT nasal spray Place 2 sprays into both nostrils daily. 48 g 1  . Fluticasone Furoate-Vilanterol (BREO ELLIPTA IN) Inhale 1 puff into the lungs daily.    [name] levocetirizine (XYZAL) 5 MG tablet Take 1 tablet (5 mg total) by mouth every evening. 90 tablet 1  . levothyroxine (SYNTHROID, LEVOTHROID) 125 MCG tablet Take daily 90 tablet 2  . Magnesium 250 MG TABS Take 250 mg by mouth at bedtime.     . meclizine (ANTIVERT) 25 MG tablet Take 1 tablet (25 mg total) by mouth daily as needed for dizziness. 30 tablet 5  . metoprolol succinate (TOPROL-XL) 25 MG 24 hr tablet TAKE 0.5  TABLET BY MOUTH  DAILY 90 tablet 2  . montelukast (SINGULAIR) 10 MG tablet TAKE 1 TABLET BY MOUTH EVERYDAY AT BEDTIME 90 tablet 1  . pantoprazole (PROTONIX) 40 MG tablet Take 1 tablet (40 mg total) by mouth daily. 90 tablet 2  . silodosin (RAPAFLO) 8 MG CAPS capsule Take 1 capsule (8 mg total) by mouth at bedtime. 90 capsule 2  . tiZANidine (ZANAFLEX) 4 MG tablet Take 1 tablet (4 mg total) by mouth 3 (three) times daily as needed for muscle spasms. 90 tablet 5  . vitamin C  (ASCORBIC ACID) 500 MG tablet Take 500 mg 3 (three) times a week by mouth.     . Diphenhyd-Hydrocort-Nystatin (FIRST-DUKES MOUTHWASH) SUSP Use as directed 5 mLs in the mouth or throat 3 (three) times daily as needed. 120 mL 0  . amoxicillin (AMOXIL) 500 MG capsule TAKE 4 CAPSULES 1 HOUR PRIOR TO PROCEDURE 4 capsule 2   No facility-administered medications prior to visit.     ROS Review of Systems  Constitutional: Positive for unexpected weight change. Negative for chills, fatigue and fever.  HENT: Negative.  Negative for trouble swallowing and voice change.   Respiratory: Positive for cough and wheezing. Negative for chest tightness and shortness of breath.   Cardiovascular: Negative for chest pain and leg swelling.  Gastrointestinal: Negative for abdominal pain, constipation, diarrhea, nausea and vomiting.  Endocrine: Negative for cold intolerance and heat intolerance.  Genitourinary: Negative.  Negative for difficulty urinating, dysuria, flank pain and hematuria.  Musculoskeletal: Negative for arthralgias and back pain.  Skin: Negative.   Neurological: Negative.  Negative for dizziness, weakness, light-headedness and headaches.  Hematological: Negative for adenopathy. Does not bruise/bleed easily.  Psychiatric/Behavioral: Negative.     Objective:  BP 124/80 (BP Location: Left Arm, Patient Position: Sitting, Cuff Size: Normal)   Pulse 70   Temp 97.6 F (36.4 C) (Oral)  Ht 5\' 10"  (1.778 m)   Wt 181 lb 8 oz (82.3 kg)   SpO2 92%   BMI 26.04 kg/m   BP Readings from Last 3 Encounters:  11/14/18 124/80  10/18/18 128/62  06/27/18 136/74    Wt Readings from Last 3 Encounters:  11/14/18 181 lb 8 oz (82.3 kg)  10/18/18 180 lb 12.8 oz (82 kg)  06/27/18 184 lb 4 oz (83.6 kg)    Physical Exam  Constitutional: He is oriented to person, place, and time.  Non-toxic appearance. He does not appear ill. No distress.  HENT:  Mouth/Throat: Oropharynx is clear and moist. No oropharyngeal  exudate.  Eyes: Conjunctivae are normal. No scleral icterus.  Neck: Normal range of motion. No JVD present. No thyromegaly present.  Cardiovascular: Normal rate, regular rhythm and intact distal pulses. Exam reveals no friction rub.  No murmur heard. Pulmonary/Chest: Effort normal and breath sounds normal. No respiratory distress. He has no wheezes. He has no rhonchi. He has no rales.  Abdominal: Soft. Normal appearance and bowel sounds are normal. There is no hepatosplenomegaly. There is tenderness in the right upper quadrant, right lower quadrant, epigastric area and left upper quadrant. There is no rigidity, no rebound, no guarding and no CVA tenderness. No hernia.  Musculoskeletal: Normal range of motion. He exhibits no edema, tenderness or deformity.  Lymphadenopathy:    He has no cervical adenopathy.  Neurological: He is alert and oriented to person, place, and time.  Skin: Skin is warm and dry. No rash noted. He is not diaphoretic.  Vitals reviewed.   Lab Results  Component Value Date   WBC 7.2 11/14/2018   HGB 15.6 11/14/2018   HCT 45.6 11/14/2018   PLT 304.0 11/14/2018   GLUCOSE 83 11/14/2018   CHOL 130 04/19/2018   TRIG 71.0 04/19/2018   HDL 37.50 (L) 04/19/2018   LDLDIRECT 139.4 06/15/2007   LDLCALC 78 04/19/2018   ALT 17 11/14/2018   AST 15 11/14/2018   NA 132 (L) 11/14/2018   K 4.1 11/14/2018   CL 95 (L) 11/14/2018   CREATININE 0.98 11/14/2018   BUN 14 11/14/2018   CO2 30 11/14/2018   TSH 5.70 (H) 11/14/2018   PSA 1.77 04/19/2018   INR 1.16 05/13/2015   HGBA1C 5.7 07/31/2015    Dg Chest 2 View  Result Date: 04/17/2018 CLINICAL DATA:  Routine examination. History of Osler Weber Rendu disease. Asymptomatic. EXAM: CHEST - 2 VIEW COMPARISON:  Chest x-ray dated October 25, 2016. FINDINGS: The heart size and mediastinal contours are within normal limits. Normal pulmonary vascularity. No focal consolidation, pleural effusion, or pneumothorax. No acute osseous  abnormality. IMPRESSION: No active cardiopulmonary disease. Electronically Signed   By: Titus Dubin M.D.   On: 04/17/2018 15:41   Dg Abd Acute W/chest  Result Date: 11/14/2018 CLINICAL DATA:  Cough.  Generalized abdominal tenderness. EXAM: DG ABDOMEN ACUTE W/ 1V CHEST COMPARISON:  Chest x-ray 04/17/2018 FINDINGS: Heart is normal size. Biapical scarring. No confluent airspace opacities or effusions. Moderate stool burden in the colon. No evidence of bowel obstruction, organomegaly or free air. No acute bony abnormality. IMPRESSION: No active cardiopulmonary disease.  Biapical scarring. Moderate stool burden.  No obstruction or free air. Electronically Signed   By: Rolm Baptise M.D.   On: 11/14/2018 11:23     Assessment & Plan:   Dream was seen today for abdominal pain and cough.  Diagnoses and all orders for this visit:  Cough-his chest x-ray is positive for scarring in  bilateral upper lobes. -     POCT EXHALED NITRIC OXIDE -     DG Abd Acute W/Chest; Future  Iron deficiency anemia due to chronic blood loss-his H&H are normal now. -     CBC with Differential/Platelet; Future  Acquired hypothyroidism-his TSH is in the acceptable range.  He will remain on the current dose of levothyroxine. -     TSH; Future  Generalized abdominal tenderness without rebound tenderness- His plain films only revealed mild constipation which he is not symptomatic with.  Otherwise, his labs are negative for any organic illness.  He has had weight loss so I have asked him undergo any CT scan of the abdomen to see if there is an occult malignancy. 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  I have asked  him to try a course of systemic steroids. -     methylPREDNISolone (MEDROL DOSEPAK) 4 MG TBPK tablet; TAKE AS DIRECTED  Abnormal finding on chest xray- He has had a cough for 6 weeks and weight loss.  He has a slightly abnormal chest x-ray and a low Na+ level.  I have asked him to undergo a CT scan of the chest to see if there is an occult infection or malignancy. -     CT Chest Wo Contrast; Future  Abnormal weight loss- As above -     CT Chest Wo Contrast; Future -     CT Abdomen Pelvis Wo Contrast; Future   I have discontinued [name] "[name]"'s amoxicillin and FIRST-DUKES MOUTHWASH. I am also having him start on methylPREDNISolone. Additionally, I am having him maintain his Magnesium, Vitamin D3, FERROUS SULFATE PO, vitamin C, clorazepate, levocetirizine, fluticasone, Fluticasone Furoate-Vilanterol (BREO ELLIPTA IN), albuterol, levothyroxine, silodosin, metoprolol succinate, pantoprazole, meclizine, montelukast, and tiZANidine.  Meds ordered this encounter  Medications  . methylPREDNISolone (MEDROL DOSEPAK) 4 MG TBPK tablet    Sig: TAKE AS DIRECTED    Dispense:  21 tablet    Refill:  0     Follow-up: Return in about 1 week (around 11/21/2018).  Scarlette Calico, MD

## 2018-11-14 NOTE — Patient Instructions (Signed)
Cough, Adult  Coughing is a reflex that clears your throat and your airways. Coughing helps to heal and protect your lungs. It is normal to cough occasionally, but a cough that happens with other symptoms or lasts a long time may be a sign of a condition that needs treatment. A cough may last only 2-3 weeks (acute), or it may last longer than 8 weeks (chronic).  What are the causes?  Coughing is commonly caused by:   Breathing in substances that irritate your lungs.   A viral or bacterial respiratory infection.   Allergies.   Asthma.   Postnasal drip.   Smoking.   Acid backing up from the stomach into the esophagus (gastroesophageal reflux).   Certain medicines.   Chronic lung problems, including COPD (or rarely, lung cancer).   Other medical conditions such as heart failure.    Follow these instructions at home:  Pay attention to any changes in your symptoms. Take these actions to help with your discomfort:   Take medicines only as told by your health care provider.  ? If you were prescribed an antibiotic medicine, take it as told by your health care provider. Do not stop taking the antibiotic even if you start to feel better.  ? Talk with your health care provider before you take a cough suppressant medicine.   Drink enough fluid to keep your urine clear or pale yellow.   If the air is dry, use a cold steam vaporizer or humidifier in your bedroom or your home to help loosen secretions.   Avoid anything that causes you to cough at work or at home.   If your cough is worse at night, try sleeping in a semi-upright position.   Avoid cigarette smoke. If you smoke, quit smoking. If you need help quitting, ask your health care provider.   Avoid caffeine.   Avoid alcohol.   Rest as needed.    Contact a health care provider if:   You have new symptoms.   You cough up pus.   Your cough does not get better after 2-3 weeks, or your cough gets worse.   You cannot control your cough with suppressant  medicines and you are losing sleep.   You develop pain that is getting worse or pain that is not controlled with pain medicines.   You have a fever.   You have unexplained weight loss.   You have night sweats.  Get help right away if:   You cough up blood.   You have difficulty breathing.   Your heartbeat is very fast.  This information is not intended to replace advice given to you by your health care provider. Make sure you discuss any questions you have with your health care provider.  Document Released: 06/04/2011 Document Revised: 05/13/2016 Document Reviewed: 02/12/2015  Elsevier Interactive Patient Education  2018 [employer] Inc[employer]

## 2018-11-14 NOTE — Telephone Encounter (Signed)
Copied from [place] [phone]. Topic: Quick Communication - See Telephone Encounter >> Nov 14, 2018  4:45 PM Bea Graff, NT wrote: CRM for notification. See Telephone encounter for: 11/14/18. Pt calling to request a call with lab results.

## 2018-11-15 NOTE — Telephone Encounter (Signed)
PCP sent mychart message.

## 2018-11-20 NOTE — Telephone Encounter (Signed)
Patient had questions about his thyroid test. He states it is higher and he has been losing weight. He wanted to know if his medication needs to be adjusted. Please follow up with patient. Thank you.

## 2018-11-22 ENCOUNTER — Telehealth: Payer: Self-pay | Admitting: Internal Medicine

## 2018-11-22 NOTE — Telephone Encounter (Signed)
Pt contacted and informed that weight loss would not be a side effect of the TSH level being elevated. Pt stated understanding and wanted to make sure that that lab value was not missed. I assured patient that it was not missed and that we were more concerned with the elevated chloride level and pt getting the CT Scan, which is to be done on the 9th of December.

## 2018-11-22 NOTE — Telephone Encounter (Signed)
Copied from [place] [phone]. Topic: Quick Communication - See Telephone Encounter >> Nov 22, 2018  3:21 PM Bea Graff, NT wrote: CRM for notification. See Telephone encounter for: 11/22/18. Pt checking status on the mychart message he sent dated 11/26 regarding his thyroid medication.

## 2018-11-27 ENCOUNTER — Ambulatory Visit (INDEPENDENT_AMBULATORY_CARE_PROVIDER_SITE_OTHER)
Admission: RE | Admit: 2018-11-27 | Discharge: 2018-11-27 | Disposition: A | Discharge status: Home / Self Care | Payer: Medicare Other | Source: Ambulatory Visit | Attending: Internal Medicine | Admitting: Internal Medicine

## 2018-11-27 DIAGNOSIS — R10817 Generalized abdominal tenderness: Secondary | ICD-10-CM | POA: Diagnosis not present

## 2018-11-27 DIAGNOSIS — R9389 Abnormal findings on diagnostic imaging of other specified body structures: Secondary | ICD-10-CM

## 2018-11-27 DIAGNOSIS — R634 Abnormal weight loss: Secondary | ICD-10-CM | POA: Diagnosis not present

## 2018-11-28 ENCOUNTER — Encounter: Payer: Self-pay | Admitting: Internal Medicine

## 2018-11-28 ENCOUNTER — Other Ambulatory Visit: Payer: Self-pay | Admitting: Internal Medicine

## 2018-11-28 DIAGNOSIS — R911 Solitary pulmonary nodule: Secondary | ICD-10-CM

## 2018-11-28 DIAGNOSIS — R918 Other nonspecific abnormal finding of lung field: Secondary | ICD-10-CM

## 2018-11-28 DIAGNOSIS — Q2734 Arteriovenous malformation of renal vessel: Secondary | ICD-10-CM

## 2018-11-28 DIAGNOSIS — N281 Cyst of kidney, acquired: Secondary | ICD-10-CM

## 2018-12-15 ENCOUNTER — Other Ambulatory Visit: Payer: Self-pay | Admitting: Internal Medicine

## 2018-12-15 ENCOUNTER — Ambulatory Visit (INDEPENDENT_AMBULATORY_CARE_PROVIDER_SITE_OTHER)
Admission: RE | Admit: 2018-12-15 | Discharge: 2018-12-15 | Disposition: A | Discharge status: Home / Self Care | Payer: Medicare Other | Source: Ambulatory Visit | Attending: Internal Medicine | Admitting: Internal Medicine

## 2018-12-15 DIAGNOSIS — R911 Solitary pulmonary nodule: Secondary | ICD-10-CM

## 2018-12-15 DIAGNOSIS — N281 Cyst of kidney, acquired: Secondary | ICD-10-CM

## 2018-12-15 DIAGNOSIS — Q2734 Arteriovenous malformation of renal vessel: Secondary | ICD-10-CM

## 2018-12-15 DIAGNOSIS — R918 Other nonspecific abnormal finding of lung field: Secondary | ICD-10-CM

## 2018-12-15 MED ORDER — IOPAMIDOL (ISOVUE-370) INJECTION 76%
100.0000 mL | Freq: Once | INTRAVENOUS | Status: AC | PRN
  Administered 2018-12-15: 100 mL via INTRAVENOUS

## 2018-12-17 ENCOUNTER — Encounter: Payer: Self-pay | Admitting: Internal Medicine

## 2019-01-03 ENCOUNTER — Ambulatory Visit: Payer: Self-pay

## 2019-01-03 NOTE — Telephone Encounter (Signed)
Pt.'s wife reports that pt. Took a Zanaflex this morning, and within 30 minutes was very sleepy, dizzy and had blurred vision. BP 61/33, pulse in the 50's. States this is the first time he has taken this medication. Pt. Talking to wife in the background. Instructed wife to call 911. Verbalizes understanding.  Reason for Disposition . Sounds like a life-threatening emergency to the triager  Answer Assessment - Initial Assessment Questions 1. BLOOD PRESSURE: "What is the blood pressure?" "Did you take at least two measurements 5 minutes apart?"     61/33 2. ONSET: "When did you take your blood pressure?"     A few minutes ago 3. HOW: "How did you obtain the blood pressure?" (e.g., visiting nurse, automatic home BP monitor)     Home monitor 4. HISTORY: "Do you have a history of low blood pressure?" "What is your blood pressure normally?"     No 5. MEDICATIONS: "Are you taking any medications for blood pressure?" If yes: "Have they been changed recently?"     Yes 6. PULSE RATE: "Do you know what your pulse rate is?"      50's per wife 7. OTHER SYMPTOMS: "Have you been sick recently?" "Have you had a recent injury?"     No 8. PREGNANCY: "Is there any chance you are pregnant?" "When was your last menstrual period?"     n/a  Protocols used: LOW BLOOD PRESSURE-A-AH

## 2019-01-24 ENCOUNTER — Ambulatory Visit: Payer: Medicare Other | Admitting: Internal Medicine

## 2019-01-29 ENCOUNTER — Ambulatory Visit (INDEPENDENT_AMBULATORY_CARE_PROVIDER_SITE_OTHER): Payer: Medicare Other | Admitting: Internal Medicine

## 2019-01-29 ENCOUNTER — Other Ambulatory Visit: Payer: Self-pay | Admitting: Internal Medicine

## 2019-01-29 ENCOUNTER — Encounter: Payer: Self-pay | Admitting: Internal Medicine

## 2019-01-29 VITALS — BP 148/68 | HR 88 | Ht 70.0 in | Wt 183.5 lb

## 2019-01-29 DIAGNOSIS — Q2572 Congenital pulmonary arteriovenous malformation: Secondary | ICD-10-CM

## 2019-01-29 DIAGNOSIS — Q2730 Arteriovenous malformation, site unspecified: Secondary | ICD-10-CM

## 2019-01-29 DIAGNOSIS — R911 Solitary pulmonary nodule: Secondary | ICD-10-CM

## 2019-01-29 DIAGNOSIS — K219 Gastro-esophageal reflux disease without esophagitis: Secondary | ICD-10-CM | POA: Insufficient documentation

## 2019-01-29 MED ORDER — PANTOPRAZOLE SODIUM 40 MG PO TBEC: 40.0000 mg | DELAYED_RELEASE_TABLET | Freq: Two times a day (BID) | ORAL | 1 refills | Status: DC

## 2019-01-29 NOTE — Patient Instructions (Signed)
Gastroesophageal Reflux Disease, Adult Gastroesophageal reflux (GER) happens when acid from the stomach flows up into the tube that connects the mouth and the stomach (esophagus). Normally, food travels down the esophagus and stays in the stomach to be digested. However, when a person has GER, food and stomach acid sometimes move back up into the esophagus. If this becomes a more serious problem, the person may be diagnosed with a disease called gastroesophageal reflux disease (GERD). GERD occurs when the reflux:  Happens often.  Causes frequent or severe symptoms.  Causes problems such as damage to the esophagus. When stomach acid comes in contact with the esophagus, the acid may cause soreness (inflammation) in the esophagus. Over time, GERD may create small holes (ulcers) in the lining of the esophagus. What are the causes? This condition is caused by a problem with the muscle between the esophagus and the stomach (lower esophageal sphincter, or LES). Normally, the LES muscle closes after food passes through the esophagus to the stomach. When the LES is weakened or abnormal, it does not close properly, and that allows food and stomach acid to go back up into the esophagus. The LES can be weakened by certain dietary substances, medicines, and medical conditions, including:  Tobacco use.  Pregnancy.  Having a hiatal hernia.  Alcohol use.  Certain foods and beverages, such as coffee, chocolate, onions, and peppermint. What increases the risk? You are more likely to develop this condition if you:  Have an increased body weight.  Have a connective tissue disorder.  Use NSAID medicines. What are the signs or symptoms? Symptoms of this condition include:  Heartburn.  Difficult or painful swallowing.  The feeling of having a lump in the throat.  Abitter taste in the mouth.  Bad breath.  Having a large amount of saliva.  Having an upset or bloated  stomach.  Belching.  Chest pain. Different conditions can cause chest pain. Make sure you see your health care provider if you experience chest pain.  Shortness of breath or wheezing.  Ongoing (chronic) cough or a night-time cough.  Wearing away of tooth enamel.  Weight loss. How is this diagnosed? Your health care provider will take a medical history and perform a physical exam. To determine if you have mild or severe GERD, your health care provider may also monitor how you respond to treatment. You may also have tests, including:  A test to examine your stomach and esophagus with a small camera (endoscopy).  A test thatmeasures the acidity level in your esophagus.  A test thatmeasures how much pressure is on your esophagus.  A barium swallow or modified barium swallow test to show the shape, size, and functioning of your esophagus. How is this treated? The goal of treatment is to help relieve your symptoms and to prevent complications. Treatment for this condition may vary depending on how severe your symptoms are. Your health care provider may recommend:  Changes to your diet.  Medicine.  Surgery. Follow these instructions at home: Eating and drinking   Follow a diet as recommended by your health care provider. This may involve avoiding foods and drinks such as: ? Coffee and tea (with or without caffeine). ? Drinks that containalcohol. ? Energy drinks and sports drinks. ? Carbonated drinks or sodas. ? Chocolate and cocoa. ? Peppermint and mint flavorings. ? Garlic and onions. ? Horseradish. ? Spicy and acidic foods, including peppers, chili powder, curry powder, vinegar, hot sauces, and barbecue sauce. ? Citrus fruit juices and citrus   fruits, such as oranges, lemons, and limes. ? Tomato-based foods, such as red sauce, chili, salsa, and pizza with red sauce. ? Fried and fatty foods, such as donuts, french fries, potato chips, and high-fat dressings. ? High-fat  meats, such as hot dogs and fatty cuts of red and white meats, such as rib eye steak, sausage, ham, and bacon. ? High-fat dairy items, such as whole milk, butter, and cream cheese.  Eat small, frequent meals instead of large meals.  Avoid drinking large amounts of liquid with your meals.  Avoid eating meals during the 2-3 hours before bedtime.  Avoid lying down right after you eat.  Do not exercise right after you eat. Lifestyle   Do not use any products that contain nicotine or tobacco, such as cigarettes, e-cigarettes, and chewing tobacco. If you need help quitting, ask your health care provider.  Try to reduce your stress by using methods such as yoga or meditation. If you need help reducing stress, ask your health care provider.  If you are overweight, reduce your weight to an amount that is healthy for you. Ask your health care provider for guidance about a safe weight loss goal. General instructions  Pay attention to any changes in your symptoms.  Take over-the-counter and prescription medicines only as told by your health care provider. Do not take aspirin, ibuprofen, or other NSAIDs unless your health care provider told you to do so.  Wear loose-fitting clothing. Do not wear anything tight around your waist that causes pressure on your abdomen.  Raise (elevate) the head of your bed about 6 inches (15 cm).  Avoid bending over if this makes your symptoms worse.  Keep all follow-up visits as told by your health care provider. This is important. Contact a health care provider if:  You have: ? New symptoms. ? Unexplained weight loss. ? Difficulty swallowing or it hurts to swallow. ? Wheezing or a persistent cough. ? A hoarse voice.  Your symptoms do not improve with treatment. Get help right away if you:  Have pain in your arms, neck, jaw, teeth, or back.  Feel sweaty, dizzy, or light-headed.  Have chest pain or shortness of breath.  Vomit and your vomit looks  like blood or coffee grounds.  Faint.  Have stool that is bloody or black.  Cannot swallow, drink, or eat. Summary  Gastroesophageal reflux happens when acid from the stomach flows up into the esophagus. GERD is a disease in which the reflux happens often, causes frequent or severe symptoms, or causes problems such as damage to the esophagus.  Treatment for this condition may vary depending on how severe your symptoms are. Your health care provider may recommend diet and lifestyle changes, medicine, or surgery.  Contact a health care provider if you have new or worsening symptoms.  Take over-the-counter and prescription medicines only as told by your health care provider. Do not take aspirin, ibuprofen, or other NSAIDs unless your health care provider told you to do so.  Keep all follow-up visits as told by your health care provider. This is important. This information is not intended to replace advice given to you by your health care provider. Make sure you discuss any questions you have with your health care provider. Document Released: 09/15/2005 Document Revised: 06/14/2018 Document Reviewed: 06/14/2018 Elsevier Interactive Patient Education  2019 [employer]  

## 2019-01-29 NOTE — Progress Notes (Signed)
Subjective:  Patient ID: Gregory [name], male    DOB: February 15, 1941  Age: 78 y.o. MRN: [id number]  CC: Gastroesophageal Reflux   HPI Gregory [name] presents for f/up - He continues to complain of nonproductive cough, throat clearing, and heartburn that is not well controlled with his current PPI dose.  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  He has had a prior brain abscess that could have been related to the AVM so he wants to see interventional radiology to see if he can undergo an embolization procedure.  Outpatient Medications Prior to Visit  Medication Sig Dispense Refill  . albuterol (PROAIR HFA) 108 (90 Base) MCG/ACT inhaler Inhale 1-2 puffs into the lungs every 6 (six) hours as needed for wheezing or shortness of breath.    . Cholecalciferol (VITAMIN D3) 2000 UNITS TABS Take 2,000 Units by mouth daily.    . clorazepate (TRANXENE) 7.5 MG tablet TAKE 1 TABLET 3 TIMES A DAY AS NEEDED 90 tablet 5  . FERROUS SULFATE PO Take 65 mg by mouth 3 (three) times a week. Mon, wed, Fri    . Fluticasone Furoate-Vilanterol (BREO ELLIPTA IN) Inhale 1 puff into the lungs daily.    [name] levocetirizine (XYZAL) 5 MG tablet Take 1 tablet (5 mg total) by mouth every evening. 90 tablet 1  . levothyroxine (SYNTHROID, LEVOTHROID) 125 MCG tablet Take daily 90 tablet 2  . Magnesium 250 MG TABS Take 250 mg by mouth at bedtime.     . meclizine (ANTIVERT) 25 MG tablet Take 1 tablet (25 mg total) by mouth daily as needed for dizziness. 30 tablet 5  . metoprolol succinate (TOPROL-XL) 25 MG 24 hr tablet TAKE 0.5  TABLET BY MOUTH  DAILY 90 tablet 2  . montelukast (SINGULAIR) 10 MG tablet TAKE 1 TABLET BY MOUTH EVERYDAY AT BEDTIME 90 tablet 1  . silodosin (RAPAFLO) 8 MG CAPS capsule Take 1 capsule (8 mg total) by mouth at bedtime. 90 capsule 2  . vitamin C (ASCORBIC ACID) 500 MG tablet Take 500 mg 3 (three) times a week by mouth.     . pantoprazole (PROTONIX) 40 MG  tablet Take 1 tablet (40 mg total) by mouth daily. 90 tablet 2  . fluticasone (FLONASE) 50 MCG/ACT nasal spray Place 2 sprays into both nostrils daily. 48 g 1  . tiZANidine (ZANAFLEX) 4 MG tablet Take 1 tablet (4 mg total) by mouth 3 (three) times daily as needed for muscle spasms. 90 tablet 5   No facility-administered medications prior to visit.     ROS Review of Systems  Constitutional: Negative.  Negative for chills, diaphoresis, fatigue and fever.  HENT: Positive for voice change. Negative for trouble swallowing.   Respiratory: Positive for cough. Negative for choking, chest tightness, shortness of breath and stridor.   Cardiovascular: Negative for chest pain, palpitations and leg swelling.  Gastrointestinal: Negative for abdominal pain, constipation, diarrhea, nausea and vomiting.  Genitourinary: Negative.  Negative for difficulty urinating and dysuria.  Musculoskeletal: Negative.  Negative for arthralgias.  Skin: Negative.  Negative for color change.  Neurological: Negative.  Negative for dizziness, weakness, light-headedness and headaches.  Hematological: Negative for adenopathy. Does not bruise/bleed easily.  Psychiatric/Behavioral: Negative.     Objective:  BP (!) 148/68 (BP Location: Left Arm, Patient Position: Sitting, Cuff Size: Normal)   Pulse 88   Ht 5\' 10"  (1.778 m)   Wt 183 lb 8 oz (83.2  kg)   SpO2 93%   BMI 26.33 kg/m   BP Readings from Last 3 Encounters:  01/29/19 (!) 148/68  11/14/18 124/80  10/18/18 128/62    Wt Readings from Last 3 Encounters:  01/29/19 183 lb 8 oz (83.2 kg)  11/14/18 181 lb 8 oz (82.3 kg)  10/18/18 180 lb 12.8 oz (82 kg)    Physical Exam Vitals signs reviewed.  Constitutional:      Appearance: He is not ill-appearing or diaphoretic.  HENT:     Nose: Nose normal. No congestion or rhinorrhea.     Mouth/Throat:     Mouth: Mucous membranes are moist.     Pharynx: Oropharynx is clear. No oropharyngeal exudate or posterior  oropharyngeal erythema.  Eyes:     General: No scleral icterus.    Conjunctiva/sclera: Conjunctivae normal.  Neck:     Musculoskeletal: Normal range of motion and neck supple. No muscular tenderness.  Cardiovascular:     Rate and Rhythm: Normal rate and regular rhythm.     Pulses: Normal pulses.     Heart sounds: No murmur. No gallop.   Pulmonary:     Effort: Pulmonary effort is normal. No respiratory distress.     Breath sounds: No stridor. No wheezing, rhonchi or rales.  Abdominal:     General: Bowel sounds are normal.     Palpations: There is no mass.     Tenderness: There is no abdominal tenderness. There is no guarding.  Musculoskeletal: Normal range of motion.        General: No swelling.     Right lower leg: No edema.     Left lower leg: No edema.  Skin:    General: Skin is warm and dry.     Coloration: Skin is not pale.     Findings: No erythema or rash.  Neurological:     General: No focal deficit present.     Mental Status: He is oriented to person, place, and time. Mental status is at baseline.     Lab Results  Component Value Date   WBC 7.2 11/14/2018   HGB 15.6 11/14/2018   HCT 45.6 11/14/2018   PLT 304.0 11/14/2018   GLUCOSE 83 11/14/2018   CHOL 130 04/19/2018   TRIG 71.0 04/19/2018   HDL 37.50 (L) 04/19/2018   LDLDIRECT 139.4 06/15/2007   LDLCALC 78 04/19/2018   ALT 17 11/14/2018   AST 15 11/14/2018   NA 132 (L) 11/14/2018   K 4.1 11/14/2018   CL 95 (L) 11/14/2018   CREATININE 0.98 11/14/2018   BUN 14 11/14/2018   CO2 30 11/14/2018   TSH 5.70 (H) 11/14/2018   PSA 1.77 04/19/2018   INR 1.16 05/13/2015   HGBA1C 5.7 07/31/2015    Ct Angio Chest Aorta W/cm &/or Wo/cm  Result Date: 12/15/2018 CLINICAL DATA:  Osler Weber Rendu syndrome. Pulmonary lesions on recent noncontrast CT EXAM: CT ANGIOGRAPHY CHEST, ABDOMEN AND PELVIS TECHNIQUE: Multidetector CT imaging through the chest, abdomen and pelvis was performed using the standard protocol during  bolus administration of intravenous contrast. Multiplanar reconstructed images and MIPs were obtained and reviewed to evaluate the vascular anatomy. CONTRAST:  171mL ISOVUE-370 IOPAMIDOL (ISOVUE-370) INJECTION 76% COMPARISON:  11/27/2018 and previous FINDINGS: CTA CHEST FINDINGS Cardiovascular: Heart size normal. No pericardial effusion. The RV is nondilated. Satisfactory opacification of pulmonary arteries noted, and there is no evidence of pulmonary emboli. There is a 5.3x 3.7 cm AVM in the posterior basal segment right lower lobe with a solitary  dilated feeding artery and solitary draining vein. There is a 95mm subpleural AVM in the posterior left lower lobe 6 image 73/4 without enlargement of the associated vessels. Adequate contrast opacification of the thoracic aorta with no evidence of dissection, aneurysm, or stenosis. There is classic 3-vessel brachiocephalic arch anatomy without proximal stenosis. Minimal calcified atheromatous plaque in the distal arch. Mediastinum/Nodes: No hilar or mediastinal adenopathy. Lungs/Pleura: 5.3 cm posterior right lower lobe AVM and 5 mm left lower lobe AVM as above. No focal airspace disease. No pleural effusion. No pneumothorax. Musculoskeletal: No fracture or worrisome bone lesion. Review of the MIP images confirms the above findings. CTA ABDOMEN AND PELVIS FINDINGS VASCULAR Aorta: Mild calcified atheromatous plaque in the infrarenal segment. No aneurysm, dissection, or stenosis. Celiac: Patent without evidence of aneurysm, dissection, vasculitis or significant stenosis. Dilated central hepatic artery branches. SMA: Patent without evidence of aneurysm, dissection, vasculitis or significant stenosis. Renals: Both renal arteries are patent without evidence of aneurysm, dissection, vasculitis, fibromuscular dysplasia or significant stenosis. IMA: Patent without evidence of aneurysm, dissection, vasculitis or significant stenosis. Inflow: Mild scattered calcified atheromatous  plaque. No aneurysm, dissection, or stenosis. Veins: Early enhancement in left hepatic vein branches in segment 2 suggesting hepatic AVMs. Patent hepatic veins, portal vein, SMV, splenic vein. Circumaortic left renal vein, an anatomic variant. Review of the MIP images confirms the above findings. NON-VASCULAR Hepatobiliary: No focal liver abnormality is seen. No gallstones, gallbladder wall thickening, or biliary dilatation. Pancreas: Unremarkable. No pancreatic ductal dilatation or surrounding inflammatory changes. Spleen: Normal in size without focal abnormality. Adrenals/Urinary Tract: Adrenal glands are unremarkable. Kidneys are normal, without renal calculi, focal lesion, or hydronephrosis. Bladder is unremarkable. Stomach/Bowel: Stomach and small bowel are nondistended. Appendix not discretely identified. No pericecal inflammatory/edematous change. Colon is unremarkable. Lymphatic: No abdominal or pelvic adenopathy. Reproductive: Prostatic enlargement with central coarse calcifications. Other: No ascites. No free air. Musculoskeletal: Mild lumbar levoscoliosis apex L3-4. 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 No fracture or worrisome bone lesion. Review of the MIP images confirms the above findings. IMPRESSION: 1. Bilateral lower lobe pulmonary AVM, 5.3 cm on the right and 5 mm on the left. The larger may be approachable for percutaneous embolization to decrease risk of paradoxic embolus, stroke, brain abscess etc. 2. 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 Electronically Signed   By: Lucrezia Europe M.D.   On: 12/15/2018 16:33   Ct Angio Abd/pel W/ And/or W/o  Result Date: 12/15/2018 CLINICAL DATA:  Osler Weber Rendu syndrome. Pulmonary lesions on recent noncontrast CT EXAM: CT ANGIOGRAPHY CHEST, ABDOMEN AND PELVIS TECHNIQUE: Multidetector CT imaging through the chest,  abdomen and pelvis was performed using the standard protocol during bolus administration of intravenous contrast. Multiplanar reconstructed images and MIPs were obtained and reviewed to evaluate the vascular anatomy. CONTRAST:  197mL ISOVUE-370 IOPAMIDOL (ISOVUE-370) INJECTION 76% COMPARISON:  11/27/2018 and previous FINDINGS: CTA CHEST FINDINGS Cardiovascular: Heart size normal. No pericardial effusion. The RV is nondilated. Satisfactory opacification of pulmonary arteries noted, and there is no evidence of pulmonary emboli. There is a 5.3x 3.7 cm AVM in the posterior basal segment right lower lobe with a solitary dilated feeding artery and solitary draining vein. There is a 84mm subpleural AVM in the posterior left lower lobe 6 image 73/4 without enlargement of the associated vessels. Adequate contrast opacification of the thoracic aorta with no evidence of dissection, aneurysm, or stenosis. There is classic 3-vessel  brachiocephalic arch anatomy without proximal stenosis. Minimal calcified atheromatous plaque in the distal arch. Mediastinum/Nodes: No hilar or mediastinal adenopathy. Lungs/Pleura: 5.3 cm posterior right lower lobe AVM and 5 mm left lower lobe AVM as above. No focal airspace disease. No pleural effusion. No pneumothorax. Musculoskeletal: No fracture or worrisome bone lesion. Review of the MIP images confirms the above findings. CTA ABDOMEN AND PELVIS FINDINGS VASCULAR Aorta: Mild calcified atheromatous plaque in the infrarenal segment. No aneurysm, dissection, or stenosis. Celiac: Patent without evidence of aneurysm, dissection, vasculitis or significant stenosis. Dilated central hepatic artery branches. SMA: Patent without evidence of aneurysm, dissection, vasculitis or significant stenosis. Renals: Both renal arteries are patent without evidence of aneurysm, dissection, vasculitis, fibromuscular dysplasia or significant stenosis. IMA: Patent without evidence of aneurysm, dissection, vasculitis or  significant stenosis. Inflow: Mild scattered calcified atheromatous plaque. No aneurysm, dissection, or stenosis. Veins: Early enhancement in left hepatic vein branches in segment 2 suggesting hepatic AVMs. Patent hepatic veins, portal vein, SMV, splenic vein. Circumaortic left renal vein, an anatomic variant. Review of the MIP images confirms the above findings. NON-VASCULAR Hepatobiliary: No focal liver abnormality is seen. No gallstones, gallbladder wall thickening, or biliary dilatation. Pancreas: Unremarkable. No pancreatic ductal dilatation or surrounding inflammatory changes. Spleen: Normal in size without focal abnormality. Adrenals/Urinary Tract: Adrenal glands are unremarkable. Kidneys are normal, without renal calculi, focal lesion, or hydronephrosis. Bladder is unremarkable. Stomach/Bowel: Stomach and small bowel are nondistended. Appendix not discretely identified. No pericecal inflammatory/edematous change. Colon is unremarkable. Lymphatic: No abdominal or pelvic adenopathy. Reproductive: Prostatic enlargement with central coarse calcifications. Other: No ascites. No free air. Musculoskeletal: Mild lumbar levoscoliosis apex L3-4. 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 No fracture or worrisome bone lesion. Review of the MIP images confirms the above findings. IMPRESSION: 1. Bilateral lower lobe pulmonary AVM, 5.3 cm on the right and 5 mm on the left. The larger may be approachable for percutaneous embolization to decrease risk of paradoxic embolus, stroke, brain abscess etc. 2. 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 Electronically Signed   By: Lucrezia Europe M.D.   On: 12/15/2018 16:33    Assessment & Plan:   Daeshawn was seen today for gastroesophageal reflux.  Diagnoses and all orders for this visit:  Laryngopharyngeal reflux (LPR)- I think this may be causing his  chronic cough.  I have asked him to take the PPI twice a day. -     pantoprazole (PROTONIX) 40 MG tablet; Take 1 tablet (40 mg total) by mouth 2 (two) times daily.  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   I have discontinued [name] "[name]"'s fluticasone and tiZANidine. I have also changed his pantoprazole. Additionally, I am having him maintain his Magnesium, Vitamin D3, FERROUS SULFATE PO, vitamin C, clorazepate, levocetirizine, Fluticasone Furoate-Vilanterol (BREO ELLIPTA IN), albuterol, levothyroxine, silodosin, metoprolol succinate, meclizine, and montelukast.  Meds ordered this encounter  Medications  . pantoprazole (PROTONIX) 40 MG tablet    Sig: Take 1 tablet (40 mg total) by mouth 2 (two) times daily.    Dispense:  180 tablet    Refill:  1     Follow-up: Return in about 3 months (around 04/29/2019).  Scarlette Calico, MD

## 2019-01-31 ENCOUNTER — Other Ambulatory Visit: Payer: Medicare Other

## 2019-02-01 ENCOUNTER — Ambulatory Visit
Admission: RE | Admit: 2019-02-01 | Discharge: 2019-02-01 | Disposition: A | Discharge status: Home / Self Care | Payer: Medicare Other | Source: Ambulatory Visit | Attending: Internal Medicine | Admitting: Internal Medicine

## 2019-02-01 ENCOUNTER — Encounter: Payer: Self-pay | Admitting: Radiology

## 2019-02-01 DIAGNOSIS — M25512 Pain in left shoulder: Secondary | ICD-10-CM | POA: Insufficient documentation

## 2019-02-01 DIAGNOSIS — Q2730 Arteriovenous malformation, site unspecified: Secondary | ICD-10-CM

## 2019-02-01 HISTORY — DX: Pain in left shoulder: M25.512

## 2019-02-01 HISTORY — PX: IR RADIOLOGIST EVAL & MGMT: IMG5224

## 2019-02-01 NOTE — Consult Note (Signed)
Chief Complaint: Patient was seen in consultation today for a right lower lobe pulmonary AV malformation at the request of Dr. Scarlette Calico.  Referring Physician(s): Jones,Thomas L  History of Present Illness: Gregory [name] is a 78 y.o. 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  Imaging has also demonstrated a probable tiny pulmonary AV malformation in the posterior subpleural left lower lobe and perfusion abnormalities of the liver likely relating to multiple small parenchymal AV malformations.  His uncle and father had HHT.  [name] has 2 sons who do not have the disease.  Other than the brain abscess, [name] has had no known complication from the pulmonary AVM.  He has known of the right lower lobe pulmonary AVM since at least 2004 at which time it was demonstrated by CT.  The AVM was followed by Dr. Teressa Lower for many years and Mr. Manheim has not undergone any surgical or endovascular treatment for the pulmonary AVM.  He has not had a known stroke or acute hemorrhagic complication.  He denies dyspnea, chest pain or hemoptysis.  His baseline room air oxygen saturation is in the low 90s.  He does have a portable oxygen saturation monitor at home and states that when he walks on a treadmill his saturation does sometimes transiently drop into the 80s.  He denies any history of heart failure.  He is retired and worked in the [employer]. He does not smoke and does not have a history of hypertension.  Past Medical History:  Diagnosis Date    . Anxiety state, unspecified   . Arthritis   . Benign neoplasm of colon   . GERD (gastroesophageal reflux disease)   . 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 History of nonmelanoma skin cancer EXCISION SQUAMOUS CELL FROM HAND  . Hypertrophy of prostate with urinary obstruction and other lower urinary tract symptoms (LUTS)   . Irritable bowel syndrome   . Lumbago   . Other diseases of nasal cavity and sinuses(478.19)   . Persistent disorder of initiating or maintaining sleep   . Plantar fascial fibromatosis   . Pure hypercholesterolemia   . 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 Unspecified hypothyroidism     Past Surgical History:  Procedure Laterality Date  . APPLICATION OF CRANIAL NAVIGATION N/A 05/16/2015   Procedure: APPLICATION OF CRANIAL NAVIGATION;  Surgeon: Consuella Lose, MD;  Location: [facility];  Service: Neurosurgery;  Laterality: N/A;  . BRAIN BIOPSY Left 05/16/2015   Procedure: Stereotactic Left Brain Biopsy with Brain Lab;  Surgeon: Consuella Lose, MD;  Location: [facility];  Service: Neurosurgery;  Laterality: Left;  Stereotactic Left Brain biopsy with brainlab  . CARDIOVASCULAR STRESS TEST  01-14-2003   NO ISCHEMIA / EF 61%/ NORMAL LE WALL MOTION  . EXCISION LEFT WRIST GANGLIAN/ MYXOID CYST  05-30-2009  . IR RADIOLOGIST EVAL & MGMT  02/01/2019  . 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 OTHER SURGICAL HISTORY     pulse laser for facial telangiectasias inthe past  . removal of skin cancer from forehead  10/2012   Dr. Renda Rolls  . TRANSTHORACIC ECHOCARDIOGRAM  10-17-2008   DR CRENSHAW   NORMAL LVF/ EF 60%/  MILDLY DILATED RIGHT ATRIUM/ VENTRICULE  . VARICOCELECTOMY  1991   Dr. Tresa Endo    Allergies: Nsaids; Other; Aspirin; and  Statins  Medications: Prior to Admission medications   Medication Sig Start Date End Date Taking? Authorizing Provider  albuterol (PROAIR HFA) 108 (90 Base) MCG/ACT inhaler Inhale 1-2 puffs into the lungs every 6 (six) hours as needed for wheezing or shortness of breath.    [provider]  Cholecalciferol (VITAMIN D3) 2000 UNITS TABS Take 2,000 Units by mouth daily.    [provider]  clorazepate (TRANXENE) 7.5 MG tablet TAKE 1 TABLET 3 TIMES A DAY AS NEEDED 02/22/18   Noralee Space, MD  FERROUS SULFATE PO Take 65 mg by mouth 3 (three) times a week. Mon, wed, Fri    [provider]  Fluticasone Furoate-Vilanterol (BREO ELLIPTA IN) Inhale 1 puff into the lungs daily.    [provider]  levocetirizine (XYZAL) 5 MG tablet Take 1 tablet (5 mg total) by mouth every evening. 06/27/18   Janith Lima, MD  levothyroxine (SYNTHROID, LEVOTHROID) 125 MCG tablet Take daily 10/18/18   Noralee Space, MD  Magnesium 250 MG TABS Take 250 mg by mouth at bedtime.     [provider]  meclizine (ANTIVERT) 25 MG tablet Take 1 tablet (25 mg total) by mouth daily as needed for dizziness. 10/18/18   Noralee Space, MD  metoprolol succinate (TOPROL-XL) 25 MG 24 hr tablet TAKE 0.5  TABLET BY MOUTH  DAILY 10/18/18   Noralee Space, MD  montelukast (SINGULAIR) 10 MG tablet TAKE 1 TABLET BY MOUTH EVERYDAY AT BEDTIME 10/18/18   Noralee Space, MD  pantoprazole (PROTONIX) 40 MG tablet Take 1 tablet (40 mg total) by mouth 2 (two) times daily. 01/29/19   Janith Lima, MD  silodosin (RAPAFLO) 8 MG CAPS capsule Take 1 capsule (8 mg total) by mouth at bedtime. 10/18/18   Noralee Space, MD  vitamin C (ASCORBIC ACID) 500 MG tablet Take 500 mg 3 (three) times a week by mouth.     [provider]     Family History  Problem Relation Age of Onset  . Diabetes Mother   . Hypertension Mother   . Pancreatic cancer Mother   . Stroke Mother   . Kidney failure Father   .  Hypertension Sister   . Healthy Son     Social History   Socioeconomic History  . Marital status: Married    Spouse name: [name]  . Number of children: 2  . Years of education: Not on file  . Highest education level: Master's degree (e.g., MA, MS, MEng, MEd, MSW, MBA)  Occupational History    Comment: Consultant  Social Needs  . Financial resource strain: Not on file  . Food insecurity:    Worry: Not on file    Inability: Not on file  . Transportation needs:    Medical: Not on file    Non-medical: Not on file  Tobacco Use  . Smoking status: Never Smoker  . Smokeless tobacco: Never Used  Substance and Sexual Activity  . Alcohol use: No    Alcohol/week: 0.0 standard drinks  . Drug use:  No  . Sexual activity: Not on file  Lifestyle  . Physical activity:    Days per week: Not on file    Minutes per session: Not on file  . Stress: Not on file  Relationships  . Social connections:    Talks on phone: Not on file    Gets together: Not on file    Attends religious service: Not on file    Active member of club or organization: Not on file    Attends meetings of clubs or organizations: Not on file    Relationship status: Not on file  Other Topics Concern  . Not on file  Social History Narrative   Lives with wife in a one story home.  Has 2 sons.  Retired Veterinary surgeon.  Education: Masters degree.    Review of Systems: A 12 point ROS discussed and pertinent positives are indicated in the HPI above.  All other systems are negative.  Review of Systems  Constitutional: Negative.   HENT:       Left nare and nasal passages are obstructed after prior surgery.  Respiratory: Negative.   Cardiovascular: Negative.   Gastrointestinal: Negative.  Negative for abdominal distention, abdominal pain, diarrhea, nausea and vomiting.       History of gastrointestinal bleeding.  Genitourinary: Negative.   Musculoskeletal: Negative.   Neurological: Negative.     Vital Signs: BP (!)  109/59   Pulse 64   Temp (!) 97.4 F (36.3 C) (Oral)   Resp 14   Ht _0  (1.778 m)   Wt 79.4 kg   SpO2 92%   BMI 25.11 kg/m   Physical Exam Vitals signs reviewed.  Constitutional:      General: He is not in acute distress.    Appearance: Normal appearance. He is normal weight. He is not ill-appearing, toxic-appearing or diaphoretic.  HENT:     Head: Normocephalic and atraumatic.  Neck:     Musculoskeletal: Neck supple. No muscular tenderness.  Cardiovascular:     Rate and Rhythm: Normal rate and regular rhythm.     Pulses: Normal pulses.     Heart sounds: Normal heart sounds. No murmur. No friction rub.  Pulmonary:     Effort: Pulmonary effort is normal. No respiratory distress.     Breath sounds: No stridor. No wheezing, rhonchi or rales.     Comments: Audible bruit and flow murmur with auscultation of right posterior lower chest. Abdominal:     General: Bowel sounds are normal. There is no distension.     Palpations: Abdomen is soft. There is no mass.     Tenderness: There is no abdominal tenderness. There is no guarding or rebound.     Hernia: No hernia is present.  Musculoskeletal:        General: No swelling.  Lymphadenopathy:     Cervical: No cervical adenopathy.  Skin:    General: Skin is warm and dry.     Comments: Multiple facial telangiectasias.  Neurological:     General: No focal deficit present.     Mental Status: He is alert and oriented to person, place, and time.     Imaging: Ir Radiologist Eval & Mgmt  Result Date: 02/01/2019 Please refer to notes tab for details about interventional procedure. (Op Note)   Labs:  CBC: Recent Labs    04/19/18 0839 10/18/18 1001 11/14/18 1120  WBC 7.0 9.2 7.2  HGB 15.0 16.3 15.6  HCT 43.9 46.6 45.6  PLT 272.0 276.0 304.0  COAGS: No results for input(s): INR, APTT in the last 8760 hours.  BMP: Recent Labs    04/19/18 0839 11/14/18 1120  NA 134* 132*  K 4.4 4.1  CL 98 95*  CO2 27 30   GLUCOSE 89 83  BUN 14 14  CALCIUM 9.2 9.3  CREATININE 1.06 0.98    LIVER FUNCTION TESTS: Recent Labs    04/19/18 0839 11/14/18 1120  BILITOT 0.5 0.4  AST 15 15  ALT 12 17  ALKPHOS 55 64  PROT 6.7 7.1  ALBUMIN 4.1 4.2    Assessment and Plan:  I met with Mr. Barrales and his wife.  We reviewed prior imaging studies including the most recent CTA of the chest on 12/15/2018.  The AV malformation appears essentially stable in the right lower lobe since 2016.  An older scan from 2004 is no longer retrievable for comparison.  Studies demonstrate a tortuous supplying right lower lobe artery measuring up to 11 mm in maximal diameter.  There is a nidus area of dilated and serpiginous vascular structures measuring approximately 3.6 x 5.2 cm and a prominent draining vein measuring up to approximately 12 mm in maximal diameter eventually draining directly into the inferior right pulmonary vein at the left atrium.  No evidence of thrombus in the malformation.  There is a single focus of calcification posteriorly.  There is a second probable tiny posterior subpleural AV malformation in the posterior left lower lobe over a region measuring only 5-8 mm in diameter.  We reviewed the possible complications of untreated large pulmonary AV malformations including spontaneous rupture with life-threatening hemorrhage, stroke or other paradoxical embolic event and abnormal shunting resulting in oxygen desaturation and potentially high output cardiac failure.  He has already had one complication of a deep brain abscess likely related to the AVM and paradoxical flow.  He does have some mild oxygen desaturation which is not limiting currently.  We discussed transcatheter treatment of the AV malformation which would consist of pulmonary arteriography and occlusion at the level of the malformation and arterial inflow utilizing likely a combination of occluder devices and embolization coils.  I told Mr. Ingalsbe that I  have successfully treated similar large malformations with transcatheter embolization.  The procedure would be performed under general anesthesia and likely involve overnight observation afterwards.  There is a low risk of hemorrhage or paradoxical embolic complication at the time of an embolization procedure.  After detailed discussion, Mr. Finigan would like to discuss treatment with his wife and family.  He is favoring scheduling elective treatment of the AV malformation in order to decrease the risk of future complications.  Once we hear back from him, we will begin any necessary pre-authorization and the scheduling process.  The procedure would be performed at [facility].  Thank you for this interesting consult.  I greatly enjoyed meeting ROLLY MAGRI and look forward to participating in their care.  A copy of this report was sent to the requesting provider on this date.  Electronically Signed: Azzie Roup 02/01/2019, 4:08 PM     I spent a total of 40 Minutes  in face to face in clinical consultation, greater than 50% of which was counseling/coordinating care for a right lower lobe pulmonary AVM.

## 2019-02-05 NOTE — Progress Notes (Signed)
Subjective:    Patient ID: Gregory [name], male    DOB: 1941-04-16, 78 y.o.   MRN: [id number]  HPI    78 y/o WM here for a follow up visit... he has multiple medical problems including:  Hx OWR syndrome;  Hx atypCP;  Hyperchol;  Hypothyroidism;  GERD/ Divertics/ IBS/ Colon polyps;  BPH/ BOO;  DJD/ LBP/ Plantar fasciitis;  Hx dizziness;  Anxiety & chronic persistant insomnia... 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  LABS 10/14:  FLP- on diet alone looks pretty good w/ LDL=111...  EKG 10/14 showed SBrady, rate57, otherw wnl;  2DEcho 10/14 showed normal LV size & wall thickness, norm LVF w/ EF=55-60%, Gr1DD, mildly thickened AoV leaflets, mild MR;  ETT 11/14 showed 60mn on treadmill, hypertensive BP response, rare PVC, no ischemia, O2 sats dropped to 87% w/ exercise...  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 Given this mild airflow obstruction we will start Rx w/ Dulera100- 2spBid   CXR 3/15 showed normal heart size, clear lungs, mild apical scarring, right basilar density c/w AVM based on CT report 2004, NAD..Marland KitchenMarland Kitchen EKG 3/15 showed NSR, rate87, low volt, NAD...  ADDENDUM> LABS 4/15 showed>  FLP- TChol 137, TG 30, HDL 36, LDL 95;  Chems- wnl;  CBC- anemic w/ Hg=11.3 & MCV=77;  TSH=9.26...   Ret for Iron panel (Fe=16- 4%sat), Ferritin (6.4), Stool cards (pending); then start FeSO4-'325mg'$ BID w/ VitC500... Recheck CBC, Iron level in 6-8weeks...   Taking Synthroid112 regularly, therefore increase back to 1266m/d... Recheck TSH in 6m60mohe never returned.  LABS 10/15:  CBC- wnl w/ Hg= 16.5, Fe=88 (23%sat),  Ferritin=36;  TSH=5.10... rec to continue Fe supplement daily...  CXR 4/16 showed norm heart size, clear lungs w/ some hyperinflation, RLL AVM noted w/o change...  EKG 4/16 revealed poor tracing- NSR, rate68, minor NSSTTWA, NAD...   LABS 4/16:  FLP- at goals on diet alone;  Chems- wnl;  CBC- wnl;  TSH=4.38...   ~  June 04, 2015:  108mo66mo & post hosp check>  Gregory [name] hosp 5/24 - 05/21/15 by [facility] after presenting w/ HA & right leg weakness; he tells me he saw his dentist for routine cleaning 2wks prior to this; eval revealed a brain lesion in left basal ganglionic region (tumor vs abscess); he had brain stereotactic bx 5/27 by DrNundkumar (left frontal craniectomy) & thick pus was removed- cultures +peptostreptococcus species (otherw neg aerobic bact cult, AFB, Fungi); he was seen by ID & placed on Rochepin, Vanco, Flagyl to be continued 65mo 76mo disch (they are checking levels and adjusting dose); also started on Keppra500Bid per NS;  Since disch he is feeling better- no f/c/s, notes sl dry cough/ no sput, & the cough is keeping him awake- TUSSIONEX called in & helps he says;  Sl nausea relieved by phenergan prn...  EXAM reveals Afeb, VSS, O2sat=94% on RA at rest;  HEENT- OWR telangiectasias w/o change;  Chest- few basilar crackles w/o wheezing, rhonchi, consolidation;  Heart- RR w/o m/r/g;  Abd- neg;  Ext- neg w/o c/c/e...  CT Head 5/16 showed 2 x 3 cm LEFT basal ganglia mass, likely tumor vs abscess, moderately extensive surrounding edema, small surgical clips within or near the posterior wall LEFT maxillary sinus/LEFT pterygopalatine fossa, in this patient with history of nose bleed (these are stable from prior scans)...  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   EKG 5/16 showed NSR, rate68, wnl, NAD.Marland KitchenMarland Kitchen  Ven Dopplers 6/16 were neg for DVT  LABS- reviewed> only pos cult= peptostreptococcus from brain bx;  Chems- ok x Na & Ca sl low;  CBC- ok x WBC 14=>11... 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 Given IS, refill Phenergan & Tussionex...  ~  July 31, 2015:  226moOV & add-on appt requested for several complaints>  BoMikki [name] finished his antibiotic Rx for brain abscess (likely due to dental work several weeks prior to onset of neuro symptoms in [id number] DrCampbell has arranged for a follow up CT Brain 08/11/15 (this will be 3 wks off antibiotics);  He had an episode of tachycardia (?AFlutter w/ rvr per EMS) w/ HR incr to 170's- went to ER 7/13 (but was in NSR) & he is currently wearing a Holter monitor per DrCrenshaw w/ follow-up Cards appt sched for next week...        He presents today w/ several complaints>  1) hoarseness: note this is intermittent, off & on, swallowing OK, denies indigestion/ reflux/ etc; he is on Protonix40 w/ his hx of OWR, plus zofran/ librax; we reviewed antireflux regimen & agreed to refer him to ENT for a look at his larynx & their opinion...Marland KitchenMarland Kitchen2) c/o numbness in toes and bottom of his feet bilat for the last week or so w/ some discomfort while walking; no culprit meds on his list & we offered referral to Neuro for a neuropathy evaluation...Marland KitchenMarland Kitchen3) he is c/o weight loss> weight today is 167#, 5'10" Tall, BMI=24 which is wnl; last wt=182# 64m51moo for a 15# wt loss, appetite is good & intake unchanged  he says; we discussed caloric intake and nutritional supplements; we also checked labs> Chems- wnl, BS=103, A1c=5.7;  CBC- wnl; Note- he is on Synthroid125m36m and TSH= 3.28 when checked 07/03/15...   CXR 07/02/15 showed norm heart size, clear lungs, NAD...   CT Head 08/11/15 showed left frontal craniotomy, resolved abscess in left external capsule w/ decr edema and decr enhancement...  2DEcho 08/14/15 showed norm LV size & function w/ EF=55-60%, no regional wall motion abn, Gr2DD, trace MR & TR  ~  October 02, 2015:  64mo 564mo& Gregory hMikki Santeegained 4# up to #171# today on Ensure 1can/d:  We discussed the need for AMOXICILLIN 2gm 1H prior to dentist...    He saw ENT (we do not have note) & pt indicates that his cords were sl thinned, they offered injection rx  but his hoarseness is improved & holding off...     He saw DrCampbell, ID on 8/1> f/u anaerobic brain abscess after dental work & presented w/ right sided weakness (resolved); he finished 51moof Levaquin & Flagyl, no f/c/s, neuro exam was WNL, & f/u scan 8/22 w/ resolved abscess; pt tells me DrCampbell signed off...     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 Overall, these findings are severe in degree electrically with respect to sensory responses. 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  We reviewed prob list, meds, xrays and labs> NOTE: THEY WANT ANOTHER CT HEAD IN [id number]..  ~  January 01, 2016:  337moOV & BoMikki Santees stable- persistent neuropathy symptoms (?related to Flagyl Rx?), c/o cold hands, speech difficulty (tongue feels bigger), ?memory problem- offered speech path assessment but he notes Neuro f/u appt in several weeks & wants to wait;  Hoarseness improved w/ MMW;  DrCampbell, ID has signed off- pt & wife want another CT Head to f/u the prev brain abscess & we will order;  He knows to take AMOX prior to any procedures (due to his OWR)... No other interval visits in the last 27m27moe requests mult med refills- ok...  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    Hx Cough, mild COPD, AVM in RLL area w/ some hypoxemia during exercise> prev on Dulera100-2spBid, now off; breathing improved w/ exercise; O2 sat= 91% on RA today; c/o dry throat & encouraged to use lozenges etc...    Hx CP, episode of PAFlutter> w/u by DrCHilary Hertzs neg, no recurrent CP or arrhythmia- he remains active but notes DOE w/ stairs, ok on level ground, 2DEcho & GXT were OK x O2 sat drop to 87% at peak exercise- ?incr shunting thru his RLL AVM...    Chol> Intol to Simva40 & Cres5 due to musc cramps; FLP 4/16 on diet alone showed TChol 146, TG 85, HDL 35, LDL 94...    Hypothy>  On Synthroid 125m61m now>  Labs 7/16 revealed TSH= 3.28 & clinically euthyroid, continue same...    GI- GERD, IBS, Polyps> on Protonix40Qd & Zofran/ Librax prn; [facility]> note reviewed (heme pos stool & anemia from epistaxis & NSAIDs); he had colonoscopy 3/12- neg x for hems & DrM rec f/u 69yrs30yr   GU>  Hx BPH w/ BOO on Rapaflo 8mg/d18mr  DrEskridge; last seen 1/16 & doing satis & pt reports that his PSA was OK...    Ortho> on OTC meds prn; eval 12/11 by DrNorris for bilat leg pain & cramping, ?min atrophy  of left gastroc- he rec tonic water & neurology eval> he saw DrWillis 3/12 (note reviewed)> NCV was normal; EMG c/w mild S1 radiculopathy; he rec MRI- pt decided against the MRI;  He reports some discomfort in knees, hips, back & we decided on trial of Tramadol50...    Anxiety/ Insomnia>  He has Chlorazepate7.5 for Prn use, and Ambien10 to use for sleep Qhs... EXAM reveals Afeb, VSS, O2sat=91% on RA at rest;  Wt is up 8# to 179#; HEENT- OWR telangiectasias w/o change;  Chest- few basilar crackles w/o wheezing, rhonchi, consolidation;  Heart- RR w/o m/r/g;  Abd- neg;  Ext- neg w/o c/c/e...  LABS 1/17>  BMet- wnl  CT Brain 1/17> pending IMP/PLAN>>  Gregory [name] has mult somatic complaints lingering & he is still quite anxious; he wants another CT scan & we will order this; he is encouraged to continue his regular f/u appts w/ his specialists & we will recheck in 213month..  ADDENDUM>>  CT Brain1/17/17 w/ contrast showed no acute changes, area of hypoattenuation in left ext capsule & lentiform nucleus extending to the white matter of the centrum semiovale is improved from 07/2015, no abn post contrast enhacement- residual infection is unlikely... He will decide if he wants to repeat the scan in ~667mo.  ~  June 07, 2016:  13m6moV & pulmonary/medical follow up>  BobMikki Santees c/o persistent neuropathy symptoms and right upper lip is numb, otherw feeling well/ energy good/ no f/c/s, no cough/ no sput "except at night" he says;  Notes that he'll wake 1-2 x per week & wakes him up => we discussed reflux & a vigorous antireflux regimen (PPI before dinner, NPO after dinner, elev HOB)... We reviewed the following medical problems during today's office visit >>     OWR>  He has Osler-Weber-Rendu syndrome w/ mult telangiectasias (skin, lips, nasal w/ prev nose  bleeds- mult surg & occluded nares, AVM in RLL on CXR w/o hemoptysis);  Stable- doing satis w/o recurrent bleeding from nose...    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    Hx CP, episode of PAFlutter> w/u by DrCHilary Hertzs neg, no recurrent CP or arrhythmia- he remains active but notes DOE w/ stairs, ok on level ground, 2DEcho & GXT were OK x O2 sat dropped to 87% at peak exercise- ?incr shunting thru his RLL AVM... Marland KitchenMarland Kitchenen by Cards 2/17> OWR, AFlutter, HL; adm w/ brain abscess 5/16 (predisposed by his OWR?); on ToprolXL25, tol well, no recurrent arrhythmia...     Chol> Intol to Simva40 & Cres5 due to musc cramps; FLP 6/17 on diet alone showed TChol 155, TG 60, HDL 42, LDL 101...    Hypothy>  On Synthroid 1213m713m now>  Labs 6/17 revealed TSH= 2.41 & clinically euthyroid, continue same...    GI- GERD, IBS, Polyps> on Protonix40Qd & Zofran/ Librax prn; [facility]> note reviewed (heme pos stool & anemia from epistaxis & NSAIDs); he had colonoscopy 3/17 w/ divertics, hems, no polyps...    GU>  Hx BPH w/ BOO prev on Rapaflo 8mg/63mer DrEskridge; urodynamics showed small capacity hypersens bladder- note 12/2015 reviewed...    Ortho/ Neuro- neuropathy, hx brain abscess> on OTC meds prn; eval 12/11 by DrNorris for bilat leg pain & cramping, ?min atrophy of left gastroc- he rec tonic water &  neurology eval> he saw DrWillis 3/12 (note reviewed)> NCV was normal; EMG c/w mild S1 radiculopathy; he rec MRI- pt decided against the MRI;  He reports some discomfort in knees, hips, back & we decided on trial of Tramadol50... He saw DrPatel 12/2015- hx brain abscess 5/16, followed by neuropathy symptoms, and neuro notes reviewed...     Anxiety/ Insomnia>  He has Chlorazepate7.5 for Prn use, and Ambien10 to use for sleep Qhs... EXAM  reveals Afeb, VSS, O2sat=91% on RA at rest;  Wt is up 8# to 179#; HEENT- OWR telangiectasias w/o change;  Chest- few basilar crackles w/o wheezing, rhonchi, consolidation;  Heart- RR w/o m/r/g;  Abd- neg;  Ext- neg w/o c/c/e...  LABS 06/09/16>  FLP- all parameters at goals on diet alone;  Chems- wnl;  CBC- wnl;  TSH=2.41;  PSA=1.58 IMP/PLAN>>  We reviewed his reflux symptoms and advised a vigorous antireflux regimen w/ Protonix40 before dinner, NPO after dinner, Elev HOB; otherw same meds 7 [name] in 67mosooner prn...  ~  December 07, 2016:  6724moOV & Gregory reports doing well- no new complaints or concerns "I feel good"; he does note some right leg numbness but notes that his upper lip is better and his toe neuropathy is better...     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    He has Hx CP & PAFlutter> followed by DrHilary Hertz stable on MetoprololER25/d...    [facility] & note reviewed- OWR, GERD, divertics, colon adenoma; prev on Protonix40 but concern for memory [facility] He had EGD 09/2016 w/ gastric & duodenal AVMs noted...    He had Neuro f/u DrPatel 08/25/16> hx brain abscess (occurred after dental work), bilat feet paresthesias (thought to be due to Flagyl), intol to Gabapentin w/ GI side effects, PT helped; noted recent vision issues...     He had Urology appt w/ [facility]equ[facility]- 07/26/16> prev followed by DrEskridge, c/o freq/ urgency/ nocturia, improved on Myrbetriq50 EXAM reveals Afeb, VSS, O2sat=94% on RA at rest;  Wt is stable at 183#; HEENT- OWR telangiectasias w/o change;  Chest- few basilar crackles w/o wheezing, rhonchi, consolidation;  Heart- RR w/o m/r/g;  Abd- neg;  Ext- neg w/o c/c/e... IMP/PLAN>>  BoMikki Santees stable & doing reasonably well; he says DrMedoff stopped his Protonix because he was on it for yrs and over concern for  dementia & switched him to [employer] We gave him the Prevnar-13 vaccine today & now he is up-to-date...  ~  June 07, 2017:  24m45moV & Gregory reports that he's had some signif right shoulder pain- seen by Ortho but we do not have notes from them, states he was given a musc relaxer (no NSAIDs) & getting PT;  He also reports a f/u w/ allergy- DrSharma for "cough" and given Singulair10/ Xyzal5 which has helped... We reviewed the following medical problems during today's office visit >>     S/p bilat cataract surg DrBevis 2018    OWR>  He has Osler-Weber-Rendu syndrome w/ mult telangiectasias (skin, lips, nasal w/ prev nose bleeds- mult surg & occluded nares, AVM in RLL on CXR w/o hemoptysis, & upper GI tract);  Stable overall w/ only intermittent epistaxis, denies GI bleeding, etc... He is again anemic & we'll need to re-evaluate & rx his iron decifiency...    AR>  followed by DrSharma & recently started on Singulair10 + Xyzal5 w/ improvement in symptoms...    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    Hx CP, episode of PAFlutter> w/u by Hilary Hertz was neg, no recurrent CP or arrhythmia- he remains active but notes DOE w/ stairs, ok on level ground, 2DEcho & GXT were OK x O2 sat dropped to 87% at peak exercise- ?incr shunting thru his RLL AVM.Marland KitchenMarland Kitchen Seen by Cards 2/18> OWR, AFlutter, HL; adm w/ brain abscess 5/16 (predisposed by his OWR?); on ToprolXL25, tol well, no recurrent arrhythmia...     Chol> Intol to Simva40 & Cres5 due to musc cramps; FLP 6/18 on diet alone showed TChol 123, TG 47, HDL 42, LDL 72...    Hypothy>  On Synthroid 158mg/d now>  Labs 6/18 revealed TSH= 1.37 & clinically euthyroid, continue same...    GI- GERD, IBS, Polyps> on Protonix40Qd & Zofran/ Librax prn; [facility]> note reviewed (heme pos stool &  anemia from epistaxis & NSAIDs); he had colonoscopy 3/17 w/ divertics, hems, no polyps...    GU>  Hx BPH w/ BOO on Rapaflo 813md per DrEskridge; urodynamics showed small capacity hypersens bladder- note 12/2015 reviewed; now followed by DrCarmie Endseen 2/18 & note reviewed, frequency & nocturia, Myrbetriq helped but too $$, back on Rapaflo8...    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 He saw DrPatel for f/u 12/2016- hx brain abscess 5/16, followed for neuropathy symptoms, and neuro notes reviewed=> placed on Baclofen for musc spasticity...    Anxiety/ Insomnia>  He has Chlorazepate7.5 for Prn use...    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 EXAM reveals Afeb, VSS, O2sat=93% on RA at rest;  Wt is stable ~185#; HEENT- OWR telangiectasias w/o change & nasal obstruction  Chest- few basilar crackles w/o wheezing, rhonchi, consolidation;  Heart- RR w/o m/r/g;  Abd- neg;  Ext- neg w/o c/c/e...  LABS 06/07/17>  FLP- all parameters at goals on diet alone now;  Chems- wnl;  CBC- anemic w/ Hg-9.4, mcv=66;  TSH=1.37;  PSA=1.82...  Pt asked to return for anemia eval w/ repeat CBC  Fe  Ferritin  B12  SPE/IEP - all pending... 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  ADDENDUM>>  Additional labs confirm IDA w/ Hg=9.0, mcv=66, Fe=5 (1.1%sat), B12=817, SPE w/ low TProt/Alb & no M-spike... Rec to start FeSO4 325mg34m w/ VitC  and rec to incr protein intake daily... We plan recheck CMet & CBC/ iron panel in 25month..  ADDENDUM>>  LABS 07/11/17 showed CMet- wnl;  CBC- much improved w/ Hg=13.4, mcv=80;  Iron=137 (37%sat)... REC to decr FeSO4 to one daily w/ VitC & repeat CBC, Iron panel in 340mobefore halloween)... ADDENDUM>>  Labs 10/10/17 showed Hg=16.1 & Fe is good at 113 w/ 34% saturation;  REC to decr the FeSO4 + VitD to 3x per week on MWF & stay on this...  ~  November 02, 2017:  68m37moV & add-on appt requested after fall>  BobMikki Santeeports that he fell ~1wk ago (tripped in his garage); he hit the floor w/ his left hip/ buttock- c/o pain in left flank region (sore, tender, can't sleep);  There is no bruising and no bowel or bladder problems/ changes;  He denies cough, sput, hemoptysis, or trouble breathing; we discussed checking XRay of left lower ribs (neg), and treatment w/ rest/ heat/ Tramadol + Tylenol prn...     EXAM reveals Afeb, VSS, O2sat=92% on RA at rest;  Wt is stable ~186#; HEENT- OWR telangiectasias w/o change & nasal obstruction  Chest- few basilar crackles w/o wheezing, rhonchi, consolidation;  There is a sl bruise on his left flank area & it's tender to palp;  Heart- RR w/o m/r/g;  Abd- neg;  Ext- neg w/o c/c/e...  Left Rib Detail films 11/02/17>  No rib fx identified, lung is clear & wnl... IMP/PLAN>>  We discussed Rx w/ REST (rib binder if nec), HEAT, Tramadol50 + Tylenol500 as needed for pain;  He will call for any concerns going forward;  He is continuing w/ balance exercises etc...   ~  December 07, 2017:  17mo83mo & Gregory reports feeling well now- left flank area/ lower rib pain has resolved w/ Rx & time;  His  only other complaint today is some rectal discomfort & we discussed Rx w/ Anusol HC cream.    He remains on Xyzal, Singulair, and knows to use antibiotics prior to dental work (hx brain abscess 2016); his breathing is good- cough improved, no sput or blood...    He is taking ToprolXL25- 1/2 tab daily & BP=126/62 without HA, visual symptoms, CP, palpit, SOB, edema...    He continues Protonix & Librax, and he denies abd pain, n/v, c/d, blood seen...    Stools remain dark w/ Fe + VitC Q-mwf, labs 10/18 were back to normal... EXAM reveals Afeb, VSS, O2sat=91% on RA at rest;  Wt is stable ~189#; HEENT- OWR telangiectasias w/o change & nasal obstruction  Chest- few basilar crackles w/o wheezing, rhonchi, consolidation;  Heart- RR w/o m/r/g;  Abd- neg;  Ext- neg w/o c/c/e... IMP/PLAN>>  Gregory Gregory Santeeback to baseline now- left flank area/ lower rib pain has resolved;  Only complaint today is some rectal discomfort & we prescribed AnusWardam- he will call if not responding;    ~  April 17, 2018:  25mo 19mo& general medical follow up visit> Gregory rMikki Santeerts several issues today- 1) he feels as though his balance is off, recall prev hx dizziness & prev eval by DrWillis for Neuro and DrPatel for NS, he has vestib rehab prev which helped; 2) [place]... he has known OWR w/ occas nosebleeds & some blood in his mouth, followed by ENT w/ prev surg, still taking his FeSO4 on MWF;  His breathing is good- denies cough,  sput, hemoptysis, SOB or DOE- exercising w/ walking & doing yoga at home (no CP/ palpit/ edema;  He also reports some urinary incont & has been eval by Urology- DrEskridge... We reviewed the following medical problems during today's office visit>      S/p bilat cataract surg DrBevis 2018    OWR>  He has Osler-Weber-Rendu syndrome w/ mult telangiectasias (skin, lips, nasal w/ prev nose bleeds- mult surg & occluded nares, AVM in RLL on CXR w/o  hemoptysis, & upper GI tract);  Stable overall w/ only intermittent epistaxis, denies GI bleeding, etc... He was anemic & Hg improved to 15 now...    AR> followed by DrSharma & recently started on Singulair10 + Xyzal5 w/ improvement in symptoms...    Hx mild COPD, AVM in RLL area w/ some hypoxemia during exercise> prev on Dulera100-2spBid, now off; breathing improved w/ exercise; O2 sat= 92% on RA today; c/o dry throat & encouraged to use lozenges etc...    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     Chol> Intol to Simva40 & Cres5 due to musc cramps; FLP 5/19 on diet alone showed TChol 130, TG 71, HDL 38, LDL 78...    Hypothy>  On Synthroid 124mg/d now>  Labs 5/19 revealed TSH= 3.99 & clinically euthyroid, continue same...    GI- GERD, IBS, Polyps> on Protonix40Qd & Zofran/ Librax prn; [facility] note reviewed (heme pos stool & anemia from epistaxis & NSAIDs); he had colonoscopy 3/17 w/ divertics, hems, no polyps...    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    Anxiety/ Insomnia>  He has Chlorazepate7.5 for Prn use...    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 EXAM reveals Afeb, VSS, O2sat=92% on RA at rest;  Wt is stable ~187#; HEENT- OWR telangiectasias w/o change & nasal obstruction  Chest- few basilar crackles w/o wheezing, rhonchi, consolidation;  Heart- RR w/o m/r/g;  Abd- neg;  Ext- neg w/o c/c/e...  CXR 04/17/18 (independently reviewed by me in the PACS system) shows norm heart size, clear lungs- NAD, known RLL AVM..Marland KitchenMarland Kitchen  LABS 04/19/18>  FLP- ok w TChol 130, TG 71, HDL 38, LDL 78;  Chems- ok w/ Na=134, BS=89,  Cr=1.06, LFTs wnl;  CBC- ok w/ Hg=15.0;  TSH=3.99;  PSA=1.77... IMP/PLAN:  Gregory [name] remains stable w/ his mult medical issues- we discussed empiric Doxy after his tick exposure in [place] 2 wks ago & we will refer him to Neuro for f/u of his dizziness complaints;  otherw continue the same meds & we plan rov recheck in 4mo..   ~  October 18, 2018:  679moOV and BoMikki Santeeurned 7633est- he reports that they have been traveling all over the country by car (wife is a reluctant flyer, doing reasonably well, notes sl cough/ no sputum/ no blood, continues to exercise doing yoga tapes at home & says his breathing is better overall;  He still gets nose bleeds freq ~weekly by his hx & followed by DrBates et al;  He saw SrSharma & he started pt on Breo daily & a Proair rescue inhaler, also on Singulair & Delsym (no recent notes from their office)... he is also c/o nocturnal leg cramps- Dx w/ a periph neuropathy from Neuro DrPatel (believed to be med related, likely Flagyl used to treat his brain abscess in 2016), we discussed trial tonic water vs yellow mustard Qhs for the cramps...  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    He saw new PCP- DrTJones on 06/27/18>  C/o nasal congestion, PND, throat clearing; Hx AR, hypothy, anemia; meds reviewed, labs reviewed (last TSH= 3.99 on 04/2018), given Xyzal & Flonase, given Pneumovax-23...     He saw Urology- DrRDavis on 07/07/18>  C/o freq, nocturia, urgency; on saw palmetto, prev Rapaflo '8mg'$ , PSA has been wnl by report; placed on Rapaflo'4mg'$  (increased to '8mg'$ ) & ?Myrbetriq50 (off now); they continue to follow pt...     He saw ORTHO- DrNorris & Ramos on several occas 06/2018>  DJD R-hip w/ XRays and shot that seemed to help somewhat... We reviewed the following medical problems during today's office visit>      S/p bilat cataract surg DrBevis 2018    OWR>  He has Osler-Weber-Rendu syndrome w/ mult telangiectasias (skin, lips, nasal w/ prev nose bleeds- mult surg & occluded nares, AVM in RLL on CXR w/o hemoptysis, & upper GI tract);  Stable overall but w/ freq epistaxis (followed by ENT), denies GI bleeding, etc... He was anemic & Hg improved to 16 now on intermittent Fe rx (MWF)...    AR> followed by DrSharma & taking Breo, ProairHFA, Singulair10, + Xyzal5/ Flonase w/ improvement in symptoms...    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    Hx CP, episode of PAFlutter> w/u by DrCrenshaw was neg, no recurrent CP or arrhythmia- he remains active but notes DOE w/ stairs, ok on level  ground, 2DEcho & GXT were OK x O2 sat dropped to 87% at peak exercise- ?incr shunting thru his RLL AVM.Marland KitchenMarland Kitchen Seen by Cards 2/18> OWR, AFlutter, HL; adm w/ brain abscess 5/16; on ToprolXL25, tol well, no recurrent arrhythmia...     Chol> Intol to Simva40 & Cres5 due to musc cramps; FLP 5/19 on diet alone showed TChol 130, TG 71, HDL 38, LDL 78...    Hypothy>  On Synthroid 147mg/d now>  Labs 5/19 revealed TSH= 3.99 & clinically euthyroid, continue same...    GI- GERD, IBS, Polyps> on Protonix40Qd & Zofran/ Librax prn; [facility] note reviewed (heme pos stool & anemia from epistaxis & NSAIDs); he had colonoscopy 3/17 w/ divertics, hems, no polyps...    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    Anxiety/ Insomnia>  He has Chlorazepate7.5 for Prn use...    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 EXAM reveals Afeb, VSS, O2sat=96% on RA at rest;  Wt is stable  ~181#; HEENT- OWR telangiectasias w/o change & nasal obstruction  Chest- few basilar crackles w/o wheezing, rhonchi, consolidation;  Heart- RR w/o m/r/g;  Abd- neg;  Ext- neg w/o c/c/e...  LABS 10/18/18>  CBC- Hg=16.3, WBC=9.2, Plat=276K;  Fe=61 (19%sat), Ferritin=43 IMP/PLAN>>  BMikki Santeeis on a good medical regimen & followed by DrJones for PCP, DrSharma, DHilary Hertz DrDavis, DrNorris, DrPatel; rec to continue same meds & exercise program...           Problem List:   BRAIN ABSCESS  >>  BMikki [facility] 5[facility] - 05/21/15 by [facility] after presenting w/ HA & right leg weakness; he tells me he saw his dentist for routine cleaning 2wks prior to this; eval revealed a brain lesion in left basal ganglionic region (tumor vs abscess); he had brain stereotactic bx 5/27 by DrNundkumar (left frontal craniectomy) & thick pus was removed- cultures +peptostreptococcus species (otherw neg aerobic bact cult, AFB, Fungi); he was seen by ID & placed on Rochepin, Vanco, Flagyl to be continued 133moost disch (they are checking levels and adjusting dose); also started on Keppra500Bid per NS... ~  6/16:  Since disch he is feeling better- no f/c/s, notes sl dry cough/ no sput, & the cough is keeping him awake- TUSSIONEX called in & helps he says;  Sl nausea relieved by phenergan prn...  ~  8/16:  He was checked by DrCampbell, ID- they stopped antibiotics and rec f/u CT Brain 08/11/15= 3 wks off the antibiotic rx => resolved abscess. ~  1/17:  CT Brain - pending  OTHER DISEASES  OF NASAL CAVITY AND SINUSES - Hx of Osler-Weber-Rendue syndrome/ hereditary telangiectasias... he's had numerous nose bleeds and surgery by Etter Sjogren, now followed by Barbaraann Faster... left nares is occluded w/ skin graft- no lumen... this is quite uncomfortable to the patient but he's been told there is nothing further that can be done for this... ~  3/11:  notes occas nosebleeds but he is able to handle them... ~  4/12:  No diff in his pattern of mild nose bleeds &  he denies any severe epistaxis episodes... 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 ~  4/14:  He notes infreq bleeding now w/ saline etc... 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 Dyspnea w/ O2 desat w/ exercise >>  ~  12/14:  PFTs showed FVC=3.98 (89%), FEV1=2.72 (79%), %1sec=68, and mid-flows= 51% predicted; c/w mild obstructive dis & we decided on a trial of Dulera100-2spBid ~  4/15:  His breathing is improved w/ the [facility] and exercise program... ~  10/15:  His breathing is back to baseline after FeSO4 rx for anemia & ret of Hg to normal; he has stopped the [facility] & has it handy for prn use... ~  4/16:  prev on Dulera100-2spBid, now just prn; breathing improved w/ exercise; O2 sat= 93% on RA today; c/o dry throat & encouraged to use lozenges etc... ~  CXR 4/16 showed norm heart size, clear lungs w/ some hyperinflation, RLL AVM noted w/o change... 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 known GI telangiectasia as well without signif GI bleeding in the past- followed by  [facility]... he's also has skin telangiectasis w/ prev laser therapy at [facility]... extensive nasal problems as above... ~  labs 2/09 showed Hg= 17.0 ~  labs 2/10 showed Hg= 16.4 ~  CXR 3/11 is chr incr markings esp RLL- no change, NAD...  ~  Labs 6/11 showed Hg= 15.9 ~  Labs 4/12 showed Hg= 16.0 ~  CXR 10/12 showed mild biapical scarring, RLL AVM, NAD... ~  Labs 4/13 showed Hg= 15.2 ~  CXR 8/13 showed heart at upper lim of norm, increased markings & apical scarring, chr incr markings in RLL- AVM, NAD.[name]. 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 ~  CXR 3/15 showed normal heart size, clear lungs, mild apical scarring, right basilar density c/w AVM based  on CT report 2004, NAD.[name]. ~  CXR 4/16 showed norm heart size, clear lungs w/ some hyperinflation, RLL AVM noted w/o change... 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  Hx of CHEST PAIN (ICD-786.50) - seen Sep09 w/ several recent bouts of CP while working in yard, washing car, etc... resolved w/ rest & Protonix... EKG showed WNL, prev [facility] 1/[facility] was neg (no ischemia or infarction and EF= 61%)... Cardiac eval DrCrenshaw w/ 2DEcho 10/09 showing norm LV- no regional wall motion abn, norm LVF w/ EF= 60%, mild dil RA/ RV;  and norm MYOVIEW (no ischemia or infarction, EF= 58%)... ~  10/14: repeat cardiac eval by DrCrenshaw due to [place] w/ stairs; EKG 10/14 showed SBrady, rate57, otherw wnl;  2DEcho 10/14 showed normal LV size & wall thickness, norm LVF w/ EF=55-60%, Gr1DD, mildly thickened AoV leaflets, mild MR;  ETT 11/14 showed 70mn on treadmill, hypertensive BP response, rare PVC, no ischemia, O2 sats dropped to 87% w/ exercise ~   EKG 3/15 showed NSR, rate87, low volt, NAD..Marland KitchenMarland Kitchen  Episode of AFLUTTER >> this occurred 06/2015 & notes from Cards ([facility]& SRichardson Doppare reviewed); placed on lose dose TOPROL-XL & titrated up to '25mg'$ /d... Holter monitor placed and results pending...   HYPERCHOLESTEROLEMIA, MILD (ICD-272.0) - now on CRESTOR '5mg'$ /d>  Prev Rx w/ Simva40 but developed muscle cramping which resolved off the med. ~  FLP 6/08 showed TChol 208, TG 75, HDL 34, LDL 139... he preferred diet Rx- ~  FLP 2/09 showed TChol 172, TG 69, HDL 30, LDL 129... he agreed to [place].. ~  FW[place]8/09 on Crestor10 showed TChol 91, TG 49, HDL 28, LDL 54... rec- keep same. ~  FLP 2/10 on Crestor10 showed TChol 108, TG 56, HDL 31, LDL 66 ~  9/10:  we discussed changing Crestor10 to Simvastatin40 for $$$ reasons... ~  FOg[place]3/11 on Simva40 showed TChol 105, TG 59, HDL 39, LDL 54... continue same. ~  FM[place]4/12 on Simva40 showed TChol 104, TG 62, HDL 29, LDL 63... C/o severe muscle cramps & wants to stop for 162mocramping resolved! ~  Referred to [facility] & they restarted his Crestor at '5mg'$ /d> tol well so far... ~  FLP 10/12 on Cres5 showed TChol 114, TG 40, HDL 36, LDL 70 ~  FLP 2/13 on Cres5 showed TChol 124, TG 21, HDL 40, LDL 80 ~  FLP 4/14 on Cres5 showed TChol 119, TG 44, HDL 38, LDL 72  ~  6/14: pt stopped Cres5 on his own due to leg cramps & decided to hold statins for now w/ repeat FLP off meds in several months... ~  FLP 10/14 on diet alone showed TChol 155, TG 29, HDL 38, LDL 111  ~  FLP 4/15 on diet alone showed TChol 137, TG 30, HDL 36, LDL 95 ~  FLP 4/16 on diet alone showed TChol 146, TG 85, HDL 35, LDL 94  HYPOTHYROIDISM - on SYNTHROID 11274md now; prev 125m25m dose was decreased 4/12... ~  labs 2/09 showed TSH= 0.69...  8/09 showed TSH= 0.44... ~  labs 2/10 showed TSH= 0.68 ~  labs 3/11 showed TSH= 0.52 ~  Labs 4/12 on Synthroid125 showed TSH= 0.33... We decided to decr the dose to 112/d. ~  Labs 4/13 on Levo112  showed TSH= 4.60 ~  Labs 4/14 on LevoWUJW119  showed TSH= 5.46 ~  Labs 4/15 on Levo112 showed TSH= 9.26 and we decided to incr dose back to 174mg/d... ~  Labs 4/16 on Levo125 showed TSH= 4.38  GERD, IRRITABLE BOWEL SYNDROME, COLONIC POLYPS - [facility] on Protonix40 (he tried OTC Zantac '75mg'$  on his own)... also takes  LRoberta..  ~  colonoscopy 1/04 w/ divertics, diminutive rectal polyp (adenomatous), hems...  ~  f/u colon 1/07 showed no recurrent polyps... ~  [facility]  HYPERTROPHY PROSTATE W/UR OBST & OTH LUTS (ICD-600.01) - followed by DrTKZSWFUXn RAPAFLO '8mg'$ /d (stopped Flomax due to side effect- ED)... doing well without LTOS, just complains of nocturia x 1-2 ~  labs 2/09 showed PSA= 0.76 ~  labs 2/10 showed PSA= 0.90 ~  2011 PSA checked by DrDavis (pt states it was OK)... ~  Labs 4/12 showed PSA= 1.22 ~  Labs 4/13 showed PSA= 1.31 ~  10/13: presents c/o incr freq, nocturia x2-4, & weak stream despite Rapaflo; he is intol to Flomax; refer to Urology for further eval=> seen by DrEskridge, no changes made... ~  1/15: f/u by Urology, DrEskridge> BPH, LUTS, hypersens unstable bladder; on Rapaflo8 & doing satis, PSA reported to be 2.34...  LEFT ARM & SHOULDER PAIN >> he was checked by GbLacie Scottsut not improving he says; [facility]> notes reviewed.  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 ~  9/10:  requesting Rx for Robaxin for muscle spasm as needed.  PLANTAR FASCIITIS (ICD-728.71) - c/o pain in his feet secondary to plantar fasciitis eval & [facility]/ Cele[facility] & shots... ~  [facility]10: [facility].  Hx NOCTURNAL LEG CRAMPS >>  ~  This initially occurred several yrs ago while on Simvastatin & the cramps resolved off this med; he was subseq placed on  Cres5 & tol well... ~  6/14: he indicates that noct leg cramps returned x1wk & he stopped the Cres5 w/ improvement; he wants to leave off the statins for now; rec to try tonic water/ yellow mustard prn...  Hx of DIZZINESS (ICD-780.4) - sudden episode dizziness Sep09 working at [employer]/ room spinning- lasted 1050mand resolved spontaneously, felt light headed afterwards... no LOC, syncope, seizure activity, etc... no CP/ palpit/ SOB/ etc... there were several EMTs in the restaurant and they checked him out- stable and transported to ER... 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 Marland Kitchenreated w/ MECLIZINE w/ improvement.  ANXIETY (ICD-300.00) & INSOMNIA, CHRONIC (ICD-307.42) - he uses CHLORAZEPATE 7.'5mg'$  Prn & AMBIEN'10mg'$ /QhMaudie Flakesr chronic persistant insomnia...  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  ANEMIA >>  ~  LABS 4/13 showed Hg= 15.2, MCV=91 ~  LABS 4/14 showed Hg= 14.5, MCV=84 ~  LABS 3/15 showed Hg= 13.6, MCV=78 ~  LABS 4/15 showed Hg= 11.3, MCV= 77, Iron studies showed Fe= 16 (3.6%sat), Ferritin= 6.4, & stool cards neg; Rx w/ FeSO4... ~  Labs 10/15 showed Hg= 16.5, MCV= 91, Fe=88 (23%sat), Ferritin= 36...  ~  Labs 4/16 showed Hg= 16.0  Health Maintenance: ~  GI:  followed  by [facility] & up to date on colon etc... (last 1/07 & 63yrf/u due 1/12). ~  [facility]  [facility]; PSAs have all been wnl... ~  Immunizations:  he gets yearly Flu vaccinations in the fall;  OK Pneumovax 03/12/10 at age-[id number]     Past Surgical History:  Procedure Laterality Date  . APPLICATION OF CRANIAL NAVIGATION N/A 05/16/2015   Procedure: APPLICATION OF CRANIAL NAVIGATION;  Surgeon: NConsuella Lose MD;  [facility];  Service: Neurosurgery;  Laterality: N/A;  .  BRAIN BIOPSY Left 05/16/2015   Procedure: Stereotactic Left Brain Biopsy with Brain Lab;  Surgeon: NConsuella Lose MD;  [place];  Service: Neurosurgery;  Laterality: Left;  Stereotactic Left Brain biopsy with brainlab  . CARDIOVASCULAR STRESS TEST  01-14-2003   NO ISCHEMIA / EF 61%/ NORMAL LE WALL MOTION  . EXCISION LEFT WRIST GANGLIAN/ MYXOID CYST  05-30-2009  . IR RADIOLOGIST EVAL & MGMT  02/01/2019  . 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 OTHER SURGICAL HISTORY     pulse laser for facial telangiectasias inthe past  . removal of skin cancer from forehead  10/2012   Dr. HRenda Rolls . TRANSTHORACIC ECHOCARDIOGRAM  10-17-2008   DR CRENSHAW   NORMAL LVF/ EF 60%/  MILDLY DILATED RIGHT ATRIUM/ VENTRICULE  . VARICOCELECTOMY  1991   Dr. RTresa Endo   Outpatient Encounter Medications as of 10/18/2018  Medication Sig  . albuterol (PROAIR HFA) 108 (90 Base) MCG/ACT inhaler Inhale 1-2 puffs into the lungs every 6 (six) hours as needed for wheezing or shortness of breath.  . Cholecalciferol (VITAMIN D3) 2000 UNITS TABS Take 2,000 Units by mouth daily.  . clorazepate (TRANXENE) 7.5 MG tablet TAKE 1 TABLET 3 TIMES A DAY AS NEEDED  . FERROUS SULFATE PO Take 65 mg by mouth 3 (three) times a week. Mon, wed, Fri  . Fluticasone Furoate-Vilanterol (BREO ELLIPTA IN) Inhale 1 puff into the lungs daily.  .Marland Kitchenlevocetirizine (XYZAL) 5 MG tablet Take 1 tablet (5 mg total) by mouth every evening.  .Marland Kitchenlevothyroxine (SYNTHROID, LEVOTHROID) 125 MCG tablet Take daily  . Magnesium 250 MG TABS Take 250 mg by mouth at bedtime.   . meclizine (ANTIVERT) 25 MG tablet Take 1 tablet (25 mg total) by mouth daily as needed for dizziness.  . metoprolol succinate (TOPROL-XL) 25 MG 24 hr tablet TAKE 0.5  TABLET BY MOUTH  DAILY  . montelukast (SINGULAIR) 10 MG tablet TAKE 1 TABLET BY MOUTH EVERYDAY AT BEDTIME  . silodosin (RAPAFLO) 8 MG CAPS capsule Take 1 capsule (8  mg total) by mouth at bedtime.  . vitamin C (ASCORBIC ACID) 500 MG tablet Take 500 mg 3 (three) times a week by mouth.   . [DISCONTINUED] amoxicillin (AMOXIL) 500 MG capsule TAKE 4 CAPSULES 1 HOUR PRIOR TO PROCEDURE  . [DISCONTINUED] amoxicillin (AMOXIL) 500 MG capsule TAKE 4 CAPSULES 1 HOUR PRIOR TO PROCEDURE  . [DISCONTINUED] fluticasone (FLONASE) 50 MCG/ACT nasal spray Place 2 sprays into both nostrils daily.  . [DISCONTINUED] levothyroxine (SYNTHROID, LEVOTHROID) 125 MCG tablet TAKE 1 TABLET BY MOUTH  DAILY BEFORE BREAKFAST  . [DISCONTINUED] meclizine (ANTIVERT) 25 MG tablet Take 1 tablet (25 mg total) by mouth daily as needed for dizziness.  . [DISCONTINUED] metaxalone (SKELAXIN) 800 MG tablet Take 800 mg by mouth as needed for muscle spasms.  . [DISCONTINUED] metaxalone (SKELAXIN) 800 MG tablet Take 1 tablet (  800 mg total) by mouth as needed for muscle spasms.  . [DISCONTINUED] metoprolol succinate (TOPROL-XL) 25 MG 24 hr tablet TAKE 1 TABLET BY MOUTH  DAILY (Patient taking differently: TAKE 0.5  TABLET BY MOUTH  DAILY)  . [DISCONTINUED] montelukast (SINGULAIR) 10 MG tablet TAKE 1 TABLET BY MOUTH EVERYDAY AT BEDTIME  . [DISCONTINUED] pantoprazole (PROTONIX) 40 MG tablet TAKE 1 TABLET BY MOUTH  DAILY  . [DISCONTINUED] pantoprazole (PROTONIX) 40 MG tablet Take 1 tablet (40 mg total) by mouth daily.  . [DISCONTINUED] silodosin (RAPAFLO) 8 MG CAPS capsule Take 1 capsule (8 mg total) by mouth at bedtime.   No facility-administered encounter medications on file as of 10/18/2018.     Allergies  Allergen Reactions  . Nsaids Other (See Comments)    Cannot take-per MD  . Other Other (See Comments)    All blood thinners-cannot take per MD  . Aspirin Other (See Comments)    nosebleeds  . Statins Other (See Comments)    Weakness, myalgias    Immunization History  Administered Date(s) Administered  . Influenza Split 09/21/2011, 09/27/2012  . Influenza Whole 09/11/2008, 09/11/2009, 09/22/2010   . Influenza, High Dose Seasonal PF 09/09/2016, 09/16/2017, 10/03/2018  . Influenza,inj,Quad PF,6+ Mos 10/04/2013, 10/08/2014, 10/02/2015  . Pneumococcal Conjugate-13 12/07/2016  . Pneumococcal Polysaccharide-23 03/12/2010, 06/27/2018  . Tdap 10/04/2013  . Varicella 12/20/2009     Current Medications, Allergies, Past Medical History, Past Surgical History, Family History, and Social History were reviewed in [employer] record.    Review of Systems       See HPI - other systems neg except as noted... The patient denies anorexia, fever, weight loss, weight gain, vision loss, decreased hearing, hoarseness, chest pain, syncope, dyspnea on exertion, peripheral edema, prolonged cough, headaches, hemoptysis, abdominal pain, melena, hematochezia, severe indigestion/heartburn, hematuria, incontinence, muscle weakness, suspicious skin lesions, transient blindness, difficulty walking, depression, unusual weight change, abnormal bleeding, enlarged lymph nodes, and angioedema.     Objective:   Physical Exam      WD, WN, 78 y/o WM in NAD... 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 GENERAL:  Alert & oriented; pleasant & cooperative... HEENT:  [place]/AT, EOM-wnl, PERRLA, EACs-clear, TMs-wnl, NOSE- occluded left nares from skin grafts, MOUTH- mucosal telangiectasias.  NECK:  Supple w/ fairROM; no JVD; normal carotid impulses w/o bruits; no thyromegaly or nodules palpated; no lymphadenopathy. CHEST:  Clear to P & A; without wheezes/ rales/ or rhonchi. HEART:  Regular Rhythm; without murmurs/ rubs/ or gallops. ABDOMEN:  Soft & nontender; normal bowel sounds; no organomegaly or masses detected. EXT: without deformities, mild arthritic changes; no varicose veins/ venous insuffic/ or edema. NEURO:  CN's intact; motor testing normal; sensory testing normal; gait normal & balance OK. DERM:  mult telangiectasias...  RADIOLOGY DATA:  Reviewed in the EPIC EMR & discussed w/ the  patient...  LABORATORY DATA:  Reviewed in the EPIC EMR & discussed w/ the patient...   Assessment & Plan:    10/23/17>  Pt tripped in his garage & fell on his left side w/ sl bruise & tenderness over the lower ribs; XRay NEG for Fx & we discussed Rx w/ REST (rib binder if nec), HEAT, Tramadol50 + Tylenol500 as needed for pain;  He will call for any concerns going forward;  He is continuing w/ balance exercises etc...  12/07/17>   All symptoms resolved w/ time & rx 04/17/18>   Gregory [name] remains stable w/ his mult medical issues- we discussed empiric Doxy after his tick  exposure in [place] 2 wks ago & we will refer him to Neuro for f/u of his dizziness complaints;  otherw continue the same meds & we plan rov recheck in 1mo 10/18/18>   BMikki Santeeis on a good medical regimen & followed by DDrucie Opitzfor PCP, DrSharma, DHilary Hertz DrDavis, DrNorris, DrPatel; rec to continue same meds & exercise program   [facility], DrCampbell, finished antibiotic rx 07/2015 and f/u CT Brain=> resolved abscess... He will need antibiotic prophylaxis prior to future dental work...  Hx Cough, Dyspnea on exertion, mild COPD >> as above, prev on Dulera100- 2sp Bid & exercise program- improved; we will continue to monitor his status & O2 sats; Dyspnea improved w/ Fe for anemia & ret of Hg to norm... He has Tussionex prn cough... 8/16> c/o intermittent hoarseness & referred to ENT for eval of cords => we don't have note but pt indicates sl thinning of cords and they offered injections but he's holding off...  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      Hx CP, AFlutter>  Prev cardiac w/u by DrCrenshaw was neg, no recurrent CP complaints, but he notes DOE w/ stairs & DrCrenshaw repeated 2DEcho & ETT as above... Episode of PAFlutter 06/2015=> holter monitor per cards- pending & pt improved on ToprolXL25...      Chol>  Off statins due to recurrent cramps, on diet alone & FLP is reasonable...     Hypothy>  On Synthroid 1260m/d now>  He is clinically & biochem euthyroid...     [facility]>  [facility] neg x for hems & DrM rec f/u 5y2yr.     GU>  Hx BPH w/ BOO & followed by DrEskridge on Rapaflo '8mg'$ /d & stable...     Ortho>  See above- prev stable on OTC analgesics as needed, but recent incr symptoms- try Tramadol50 prn...  LEG CRAMPS >> he is quite sure they are from his statin Rx & we decided to leave these off for now, try to control on diet/ exercise & continued monitoring... 8/16> c/o numbness in toes & bottom of feet 8/16>  Refer to Neuro for neuropathy eval...     Anxiety/ Insomnia>  He has Chlorazepate for Prn use, and Ambien to use for sleep Qhs...  Anemia>  Prev on FeSO4 supplement & Hg now back to normal, dyspnea ret to baseline 7 he stopped the Fe... 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   Patient's Medications  New Prescriptions   No medications on file  Previous Medications   ALBUTEROL (PROAIR HFA) 108 (90 BASE) MCG/ACT INHALER    Inhale 1-2 puffs into the lungs every  6 (six) hours as needed for wheezing or shortness of breath.   CHOLECALCIFEROL (VITAMIN D3) 2000 UNITS TABS     Take 2,000 Units by mouth daily.   CLORAZEPATE (TRANXENE) 7.5 MG TABLET    TAKE 1 TABLET 3 TIMES A DAY AS NEEDED   FERROUS SULFATE PO    Take 65 mg by mouth 3 (three) times a week. Mon, wed, Fri   FLUTICASONE FUROATE-VILANTEROL (BREO ELLIPTA IN)    Inhale 1 puff into the lungs daily.   LEVOCETIRIZINE (XYZAL) 5 MG TABLET    Take 1 tablet (5 mg total) by mouth every evening.   MAGNESIUM 250 MG TABS    Take 250 mg by mouth at bedtime.    VITAMIN C (ASCORBIC ACID) 500 MG TABLET    Take 500 mg 3 (three) times a week by mouth.   Modified Medications   Modified Medication Previous Medication   LEVOTHYROXINE (SYNTHROID, LEVOTHROID) 125 MCG TABLET levothyroxine (SYNTHROID, LEVOTHROID) 125 MCG tablet      Take daily    TAKE 1 TABLET BY MOUTH  DAILY BEFORE BREAKFAST   MECLIZINE (ANTIVERT) 25 MG TABLET meclizine (ANTIVERT) 25 MG tablet      Take 1 tablet (25 mg total) by mouth daily as needed for dizziness.    Take 1 tablet (25 mg total) by mouth daily as needed for dizziness.   METOPROLOL SUCCINATE (TOPROL-XL) 25 MG 24 HR TABLET metoprolol succinate (TOPROL-XL) 25 MG 24 hr tablet      TAKE 0.5  TABLET BY MOUTH  DAILY    TAKE 1 TABLET BY MOUTH  DAILY   MONTELUKAST (SINGULAIR) 10 MG TABLET montelukast (SINGULAIR) 10 MG tablet      TAKE 1 TABLET BY MOUTH EVERYDAY AT BEDTIME    TAKE 1 TABLET BY MOUTH EVERYDAY AT BEDTIME   PANTOPRAZOLE (PROTONIX) 40 MG TABLET pantoprazole (PROTONIX) 40 MG tablet      Take 1 tablet (40 mg total) by mouth 2 (two) times daily.    TAKE 1 TABLET BY MOUTH  DAILY   SILODOSIN (RAPAFLO) 8 MG CAPS CAPSULE silodosin (RAPAFLO) 8 MG CAPS capsule      Take 1 capsule (8 mg total) by mouth at bedtime.    Take 1 capsule (8 mg total) by mouth at bedtime.  Discontinued Medications   AMOXICILLIN (AMOXIL) 500 MG CAPSULE    TAKE 4 CAPSULES 1 HOUR PRIOR TO PROCEDURE   AMOXICILLIN (AMOXIL) 500 MG CAPSULE    TAKE 4 CAPSULES 1 HOUR PRIOR TO PROCEDURE   FLUTICASONE (FLONASE) 50 MCG/ACT NASAL SPRAY     Place 2 sprays into both nostrils daily.   METAXALONE (SKELAXIN) 800 MG TABLET    Take 800 mg by mouth as needed for muscle spasms.

## 2019-02-20 ENCOUNTER — Ambulatory Visit: Payer: Medicare Other | Admitting: Internal Medicine

## 2019-02-21 ENCOUNTER — Telehealth: Payer: Self-pay | Admitting: Radiology

## 2019-02-21 NOTE — Telephone Encounter (Signed)
Patient was seen in consult by Dr Kathlene Cote on 02/01/2019.  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  Patient states he has discussed this with his wife and has decided against treatment at this time.    Maudell Stanbrough Riki Rusk, RN 02/21/2019 3:08 PM

## 2019-02-22 DIAGNOSIS — H903 Sensorineural hearing loss, bilateral: Secondary | ICD-10-CM | POA: Insufficient documentation

## 2019-04-23 ENCOUNTER — Encounter: Payer: Self-pay | Admitting: Internal Medicine

## 2019-04-23 ENCOUNTER — Ambulatory Visit: Payer: Medicare Other | Admitting: Internal Medicine

## 2019-04-23 ENCOUNTER — Ambulatory Visit (INDEPENDENT_AMBULATORY_CARE_PROVIDER_SITE_OTHER): Payer: Medicare Other | Admitting: Internal Medicine

## 2019-04-23 ENCOUNTER — Other Ambulatory Visit: Payer: Self-pay

## 2019-04-23 VITALS — BP 134/70 | HR 56 | Temp 97.5°F | Resp 16 | Ht 70.0 in | Wt 185.5 lb

## 2019-04-23 DIAGNOSIS — J4530 Mild persistent asthma, uncomplicated: Secondary | ICD-10-CM

## 2019-04-23 DIAGNOSIS — E039 Hypothyroidism, unspecified: Secondary | ICD-10-CM

## 2019-04-23 DIAGNOSIS — K219 Gastro-esophageal reflux disease without esophagitis: Secondary | ICD-10-CM

## 2019-04-23 DIAGNOSIS — R42 Dizziness and giddiness: Secondary | ICD-10-CM

## 2019-04-23 DIAGNOSIS — E7800 Pure hypercholesterolemia, unspecified: Secondary | ICD-10-CM | POA: Diagnosis not present

## 2019-04-23 DIAGNOSIS — N138 Other obstructive and reflux uropathy: Secondary | ICD-10-CM

## 2019-04-23 DIAGNOSIS — M545 Low back pain, unspecified: Secondary | ICD-10-CM

## 2019-04-23 DIAGNOSIS — N401 Enlarged prostate with lower urinary tract symptoms: Secondary | ICD-10-CM

## 2019-04-23 DIAGNOSIS — G8929 Other chronic pain: Secondary | ICD-10-CM

## 2019-04-23 DIAGNOSIS — Z298 Encounter for other specified prophylactic measures: Secondary | ICD-10-CM

## 2019-04-23 DIAGNOSIS — D500 Iron deficiency anemia secondary to blood loss (chronic): Secondary | ICD-10-CM | POA: Diagnosis not present

## 2019-04-23 DIAGNOSIS — Q2734 Arteriovenous malformation of renal vessel: Secondary | ICD-10-CM | POA: Diagnosis not present

## 2019-04-23 DIAGNOSIS — Z2989 Encounter for other specified prophylactic measures: Secondary | ICD-10-CM

## 2019-04-23 MED ORDER — MONTELUKAST SODIUM 10 MG PO TABS: ORAL_TABLET | ORAL | 1 refills | Status: DC

## 2019-04-23 MED ORDER — SILODOSIN 8 MG PO CAPS: 8.0000 mg | ORAL_CAPSULE | Freq: Every day | ORAL | 1 refills | Status: DC

## 2019-04-23 MED ORDER — PANTOPRAZOLE SODIUM 40 MG PO TBEC: 40.0000 mg | DELAYED_RELEASE_TABLET | Freq: Two times a day (BID) | ORAL | 1 refills | Status: DC

## 2019-04-23 MED ORDER — AMOXICILLIN 500 MG PO CAPS: 1000.0000 mg | ORAL_CAPSULE | ORAL | 3 refills | Status: DC | PRN

## 2019-04-23 MED ORDER — METAXALONE 800 MG PO TABS: 800.0000 mg | ORAL_TABLET | Freq: Three times a day (TID) | ORAL | 1 refills | Status: DC

## 2019-04-23 MED ORDER — MECLIZINE HCL 25 MG PO TABS: 25.0000 mg | ORAL_TABLET | Freq: Every day | ORAL | 5 refills | Status: DC | PRN

## 2019-04-23 NOTE — Patient Instructions (Signed)
Hypothyroidism  Hypothyroidism is when the thyroid gland does not make enough of certain hormones (it is underactive). The thyroid gland is a small gland located in the lower front part of the neck, just in front of the windpipe (trachea). This gland makes hormones that help control how the body uses food for energy (metabolism) as well as how the heart and brain function. These hormones also play a role in keeping your bones strong. When the thyroid is underactive, it produces too little of the hormones thyroxine (T4) and triiodothyronine (T3). What are the causes? This condition may be caused by:  Hashimoto's disease. This is a disease in which the body's disease-fighting system (immune system) attacks the thyroid gland. This is the most common cause.  Viral infections.  Pregnancy.  Certain medicines.  Birth defects.  Past radiation treatments to the head or neck for cancer.  Past treatment with radioactive iodine.  Past exposure to radiation in the environment.  Past surgical removal of part or all of the thyroid.  Problems with a gland in the center of the brain (pituitary gland).  Lack of enough iodine in the diet. What increases the risk? You are more likely to develop this condition if:  You are male.  You have a family history of thyroid conditions.  You use a medicine called lithium.  You take medicines that affect the immune system (immunosuppressants). What are the signs or symptoms? Symptoms of this condition include:  Feeling as though you have no energy (lethargy).  Not being able to tolerate cold.  Weight gain that is not explained by a change in diet or exercise habits.  Lack of appetite.  Dry skin.  Coarse hair.  Menstrual irregularity.  Slowing of thought processes.  Constipation.  Sadness or depression. How is this diagnosed? This condition may be diagnosed based on:  Your symptoms, your medical history, and a physical exam.  Blood  tests. You may also have imaging tests, such as an ultrasound or MRI. How is this treated? This condition is treated with medicine that replaces the thyroid hormones that your body does not make. After you begin treatment, it may take several weeks for symptoms to go away. Follow these instructions at home:  Take over-the-counter and prescription medicines only as told by your health care provider.  If you start taking any new medicines, tell your health care provider.  Keep all follow-up visits as told by your health care provider. This is important. ? As your condition improves, your dosage of thyroid hormone medicine may change. ? You will need to have blood tests regularly so that your health care provider can monitor your condition. Contact a health care provider if:  Your symptoms do not get better with treatment.  You are taking thyroid replacement medicine and you: ? Sweat a lot. ? Have tremors. ? Feel anxious. ? Lose weight rapidly. ? Cannot tolerate heat. ? Have emotional swings. ? Have diarrhea. ? Feel weak. Get help right away if you have:  Chest pain.  An irregular heartbeat.  A rapid heartbeat.  Difficulty breathing. Summary  Hypothyroidism is when the thyroid gland does not make enough of certain hormones (it is underactive).  When the thyroid is underactive, it produces too little of the hormones thyroxine (T4) and triiodothyronine (T3).  The most common cause is Hashimoto's disease, a disease in which the body's disease-fighting system (immune system) attacks the thyroid gland. The condition can also be caused by viral infections, medicine, pregnancy, or past   radiation treatment to the head or neck.  Symptoms may include weight gain, dry skin, constipation, feeling as though you do not have energy, and not being able to tolerate cold.  This condition is treated with medicine to replace the thyroid hormones that your body does not make. This information  is not intended to replace advice given to you by your health care provider. Make sure you discuss any questions you have with your health care provider. Document Released: 12/06/2005 Document Revised: 11/16/2017 Document Reviewed: 11/16/2017 Elsevier Interactive Patient Education  2019 [employer] Inc[employer]  

## 2019-04-23 NOTE — Progress Notes (Signed)
Virtual Visit via Video Note  I connected with Gregory [name] on 04/23/19 at  9:30 AM EDT by a video enabled telemedicine application and verified that I am speaking with the correct person using two identifiers.   I discussed the limitations of evaluation and management by telemedicine and the availability of in person appointments. The patient expressed understanding and agreed to proceed.  History of Present Illness:  error   Observations/Objective:  Lab Results  Component Value Date   WBC 7.2 11/14/2018   HGB 15.6 11/14/2018   HCT 45.6 11/14/2018   PLT 304.0 11/14/2018   GLUCOSE 83 11/14/2018   CHOL 130 04/19/2018   TRIG 71.0 04/19/2018   HDL 37.50 (L) 04/19/2018   LDLDIRECT 139.4 06/15/2007   LDLCALC 78 04/19/2018   ALT 17 11/14/2018   AST 15 11/14/2018   NA 132 (L) 11/14/2018   K 4.1 11/14/2018   CL 95 (L) 11/14/2018   CREATININE 0.98 11/14/2018   BUN 14 11/14/2018   CO2 30 11/14/2018   TSH 5.70 (H) 11/14/2018   PSA 1.77 04/19/2018   INR 1.16 05/13/2015   HGBA1C 5.7 07/31/2015     Assessment and Plan:   Follow Up Instructions:    I discussed the assessment and treatment plan with the patient. The patient was provided an opportunity to ask questions and all were answered. The patient agreed with the plan and demonstrated an understanding of the instructions.   The patient was advised to call back or seek an in-person evaluation if the symptoms worsen or if the condition fails to improve as anticipated.  I provided 0 minutes of non-face-to-face time during this encounter.   Scarlette Calico, MD

## 2019-04-23 NOTE — Progress Notes (Signed)
Subjective:  Patient ID: Gregory [name], male    DOB: 1941/08/05  Age: 78 y.o. MRN: [id number]  CC: Hypothyroidism   HPI Gregory [name] presents for f/up - He feels to be at his baseline.  He has a chronic, unchanged nonproductive cough.  He denies hemoptysis, chest pain, shortness of breath, or wheezing.  He saw a radiologist a few months ago about having an embolization procedure to treat the AVM in his right lower lung but he has decided not to proceed with that due to the concern for risks and complications.  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  Outpatient Medications Prior to Visit  Medication Sig Dispense Refill  . albuterol (PROAIR HFA) 108 (90 Base) MCG/ACT inhaler Inhale 1-2 puffs into the lungs every 6 (six) hours as needed for wheezing or shortness of breath.    . Cholecalciferol (VITAMIN D3) 2000 UNITS TABS Take 2,000 Units by mouth daily.    . clorazepate (TRANXENE) 7.5 MG tablet TAKE 1 TABLET 3 TIMES A DAY AS NEEDED 90 tablet 5  . FERROUS SULFATE PO Take 65 mg by mouth 3 (three) times a week. Mon, wed, Fri    . levocetirizine (XYZAL) 5 MG tablet Take 1 tablet (5 mg total) by mouth every evening. 90 tablet 1  . Magnesium 250 MG TABS Take 250 mg by mouth at bedtime.     . metoprolol succinate (TOPROL-XL) 25 MG 24 hr tablet TAKE 0.5  TABLET BY MOUTH  DAILY 90 tablet 2  . vitamin C (ASCORBIC ACID) 500 MG tablet Take 500 mg 3 (three) times a week by mouth.     . levothyroxine (SYNTHROID, LEVOTHROID) 125 MCG tablet Take daily 90 tablet 2  . meclizine (ANTIVERT) 25 MG tablet Take 1 tablet (25 mg total) by mouth daily as needed for dizziness. 30 tablet 5  . metaxalone (SKELAXIN) 800 MG tablet Take 800 mg by mouth 3 (three) times daily.    . montelukast (SINGULAIR) 10 MG tablet TAKE 1 TABLET BY MOUTH EVERYDAY AT BEDTIME 90 tablet 1  . pantoprazole (PROTONIX) 40 MG tablet Take 1 tablet (40 mg total) by mouth 2 (two) times  daily. 180 tablet 1  . silodosin (RAPAFLO) 8 MG CAPS capsule Take 1 capsule (8 mg total) by mouth at bedtime. 90 capsule 2  . amoxicillin (AMOXIL) 500 MG capsule TAKE 4 CAPSULES BY MOUTH 1 HOUR PRIOR TO PROCEDURE    . Fluticasone Furoate-Vilanterol (BREO ELLIPTA IN) Inhale 1 puff into the lungs daily.     No facility-administered medications prior to visit.     ROS Review of Systems  Constitutional: Negative.  Negative for chills, diaphoresis, fatigue and fever.  HENT: Negative.   Eyes: Negative for visual disturbance.  Respiratory: Positive for cough. Negative for chest tightness, shortness of breath, wheezing and stridor.   Cardiovascular: Negative for chest pain, palpitations and leg swelling.  Gastrointestinal: Negative for abdominal pain, constipation, diarrhea and nausea.  Endocrine: Negative for cold intolerance and heat intolerance.  Genitourinary: Negative.  Negative for difficulty urinating and dysuria.  Musculoskeletal: Positive for back pain and gait problem. Negative for arthralgias and myalgias.  Skin: Negative.   Neurological: Negative for dizziness, weakness and light-headedness.  Hematological: Negative for adenopathy. Does not bruise/bleed easily.  Psychiatric/Behavioral: Negative.  Negative for dysphoric mood and sleep disturbance. The patient is not nervous/anxious.     Objective:  BP 134/70 (BP Location: Left Arm, Patient Position: Sitting, Cuff  Size: Normal)   Pulse (!) 56   Temp (!) 97.5 F (36.4 C) (Oral)   Resp 16   Ht 5\' 10"  (1.778 m)   Wt 185 lb 8 oz (84.1 kg)   SpO2 92%   BMI 26.62 kg/m   BP Readings from Last 3 Encounters:  04/23/19 134/70  02/01/19 (!) 109/59  01/29/19 (!) 148/68    Wt Readings from Last 3 Encounters:  04/23/19 185 lb 8 oz (84.1 kg)  02/01/19 175 lb (79.4 kg)  01/29/19 183 lb 8 oz (83.2 kg)    Physical Exam Constitutional:      General: He is not in acute distress.    Appearance: He is obese. He is not ill-appearing,  toxic-appearing or diaphoretic.  HENT:     Nose: Nose normal.     Mouth/Throat:     Mouth: Mucous membranes are moist.     Pharynx: No oropharyngeal exudate.  Eyes:     General: No scleral icterus.    Conjunctiva/sclera: Conjunctivae normal.  Neck:     Musculoskeletal: Normal range of motion. No neck rigidity or muscular tenderness.  Cardiovascular:     Rate and Rhythm: Normal rate and regular rhythm.     Heart sounds: No murmur. No gallop.   Pulmonary:     Effort: Pulmonary effort is normal.     Breath sounds: No stridor. No wheezing, rhonchi or rales.  Abdominal:     General: Abdomen is flat.     Palpations: There is no hepatomegaly, splenomegaly or mass.     Tenderness: There is no abdominal tenderness. There is no guarding.  Musculoskeletal: Normal range of motion.        General: No swelling.     Right lower leg: No edema.     Left lower leg: No edema.  Lymphadenopathy:     Cervical: No cervical adenopathy.  Skin:    General: Skin is warm and dry.     Coloration: Skin is not pale.  Neurological:     General: No focal deficit present.     Lab Results  Component Value Date   WBC 7.4 04/24/2019   HGB 15.6 04/24/2019   HCT 45.7 04/24/2019   PLT 246.0 04/24/2019   GLUCOSE 85 04/24/2019   CHOL 145 04/24/2019   TRIG 80.0 04/24/2019   HDL 40.00 04/24/2019   LDLDIRECT 139.4 06/15/2007   LDLCALC 89 04/24/2019   ALT 12 04/24/2019   AST 15 04/24/2019   NA 131 (L) 04/24/2019   K 4.1 04/24/2019   CL 96 04/24/2019   CREATININE 1.06 04/24/2019   BUN 12 04/24/2019   CO2 29 04/24/2019   TSH 3.32 04/24/2019   PSA 1.77 04/19/2018   INR 1.16 05/13/2015   HGBA1C 5.7 07/31/2015    Ir Radiologist Eval & Mgmt  Result Date: 02/01/2019 Please refer to notes tab for details about interventional procedure. (Op Note)   Assessment & Plan:   Avyn was seen today for hypothyroidism.  Diagnoses and all orders for this visit:  Acquired hypothyroidism- His TSH is in the  normal range.  He will stay on the current dose of levothyroxine. -     TSH; Future -     levothyroxine (SYNTHROID) 125 MCG tablet; Take daily  HYPERCHOLESTEROLEMIA, MILD- He has an elevated ASCVD risk score but is not willing to take a statin for CV risk reduction. -     Comprehensive metabolic panel; Future -     Lipid panel; Future  Iron deficiency  anemia due to chronic blood loss- His H&H are normal.  I recommended that he continue the current iron supplement. -     CBC with Differential/Platelet; Future  Arteriovenous malformation of renal vessel -     Comprehensive metabolic panel; Future -     Urinalysis, Routine w reflex microscopic; Future  Laryngopharyngeal reflux (LPR) -     pantoprazole (PROTONIX) 40 MG tablet; Take 1 tablet (40 mg total) by mouth 2 (two) times daily.  Gastroesophageal reflux disease without esophagitis  Benign prostatic hyperplasia with urinary obstruction -     silodosin (RAPAFLO) 8 MG CAPS capsule; Take 1 capsule (8 mg total) by mouth at bedtime.  Vertigo, intermittent -     meclizine (ANTIVERT) 25 MG tablet; Take 1 tablet (25 mg total) by mouth daily as needed for dizziness.  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  Chronic midline low back pain without sciatica -     metaxalone (SKELAXIN) 800 MG tablet; Take 1 tablet (800 mg total) by mouth 3 (three) times daily.   I have discontinued [name] "[name]"'s Fluticasone Furoate-Vilanterol (BREO ELLIPTA IN). I have also changed his amoxicillin, metaxalone, and levothyroxine. Additionally, I am having him maintain his Magnesium, Vitamin D3, FERROUS SULFATE PO, vitamin C, clorazepate, levocetirizine, albuterol, metoprolol succinate,  meclizine, montelukast, pantoprazole, and silodosin.  Meds ordered this encounter  Medications  . amoxicillin (AMOXIL) 500 MG capsule    Sig: Take 2 capsules (1,000 mg total) by mouth as needed.    Dispense:  2 capsule    Refill:  3  . meclizine (ANTIVERT) 25 MG tablet    Sig: Take 1 tablet (25 mg total) by mouth daily as needed for dizziness.    Dispense:  30 tablet    Refill:  5  . metaxalone (SKELAXIN) 800 MG tablet    Sig: Take 1 tablet (800 mg total) by mouth 3 (three) times daily.    Dispense:  180 tablet    Refill:  1  . montelukast (SINGULAIR) 10 MG tablet    Sig: TAKE 1 TABLET BY MOUTH EVERYDAY AT BEDTIME    Dispense:  90 tablet    Refill:  1  . pantoprazole (PROTONIX) 40 MG tablet    Sig: Take 1 tablet (40 mg total) by mouth 2 (two) times daily.    Dispense:  180 tablet    Refill:  1  . silodosin (RAPAFLO) 8 MG CAPS capsule    Sig: Take 1 capsule (8 mg total) by mouth at bedtime.    Dispense:  90 capsule    Refill:  1  . levothyroxine (SYNTHROID) 125 MCG tablet    Sig: Take daily    Dispense:  90 tablet    Refill:  1     Follow-up: Return in about 6 months (around 10/24/2019).  Scarlette Calico, MD

## 2019-04-24 ENCOUNTER — Encounter: Payer: Self-pay | Admitting: Internal Medicine

## 2019-04-24 ENCOUNTER — Other Ambulatory Visit (INDEPENDENT_AMBULATORY_CARE_PROVIDER_SITE_OTHER): Payer: Medicare Other

## 2019-04-24 DIAGNOSIS — D500 Iron deficiency anemia secondary to blood loss (chronic): Secondary | ICD-10-CM

## 2019-04-24 DIAGNOSIS — E039 Hypothyroidism, unspecified: Secondary | ICD-10-CM | POA: Diagnosis not present

## 2019-04-24 DIAGNOSIS — E7800 Pure hypercholesterolemia, unspecified: Secondary | ICD-10-CM | POA: Diagnosis not present

## 2019-04-24 DIAGNOSIS — Q2734 Arteriovenous malformation of renal vessel: Secondary | ICD-10-CM

## 2019-04-24 LAB — COMPREHENSIVE METABOLIC PANEL
ALT: 12 U/L (ref 0–53)
AST: 15 U/L (ref 0–37)
Albumin: 4.3 g/dL (ref 3.5–5.2)
Alkaline Phosphatase: 55 U/L (ref 39–117)
BUN: 12 mg/dL (ref 6–23)
CO2: 29 mEq/L (ref 19–32)
Calcium: 8.9 mg/dL (ref 8.4–10.5)
Chloride: 96 mEq/L (ref 96–112)
Creatinine, Ser: 1.06 mg/dL (ref 0.40–1.50)
GFR: 67.83 mL/min (ref >60.00)
Glucose, Bld: 85 mg/dL (ref 70–99)
Potassium: 4.1 mEq/L (ref 3.5–5.1)
Sodium: 131 mEq/L — ABNORMAL LOW (ref 135–145)
Total Bilirubin: 0.6 mg/dL (ref 0.2–1.2)
Total Protein: 6.7 g/dL (ref 6.0–8.3)

## 2019-04-24 LAB — LIPID PANEL
Cholesterol: 145 mg/dL (ref 0–200)
HDL: 40 mg/dL (ref >39.00)
LDL Cholesterol: 89 mg/dL (ref 0–99)
NonHDL: 104.87
Total CHOL/HDL Ratio: 4
Triglycerides: 80 mg/dL (ref 0.0–149.0)
VLDL: 16 mg/dL (ref 0.0–40.0)

## 2019-04-24 LAB — CBC WITH DIFFERENTIAL/PLATELET
Basophils Absolute: 0 10*3/uL (ref 0.0–0.1)
Basophils Relative: 0.6 % (ref 0.0–3.0)
Eosinophils Absolute: 0.1 10*3/uL (ref 0.0–0.7)
Eosinophils Relative: 1.5 % (ref 0.0–5.0)
HCT: 45.7 % (ref 39.0–52.0)
Hemoglobin: 15.6 g/dL (ref 13.0–17.0)
Lymphocytes Relative: 44.3 % (ref 12.0–46.0)
Lymphs Abs: 3.3 10*3/uL (ref 0.7–4.0)
MCHC: 34.2 g/dL (ref 30.0–36.0)
MCV: 91.2 fl (ref 78.0–100.0)
Monocytes Absolute: 0.8 10*3/uL (ref 0.1–1.0)
Monocytes Relative: 11.3 % (ref 3.0–12.0)
Neutro Abs: 3.1 10*3/uL (ref 1.4–7.7)
Neutrophils Relative %: 42.3 % — ABNORMAL LOW (ref 43.0–77.0)
Platelets: 246 10*3/uL (ref 150.0–400.0)
RBC: 5.01 Mil/uL (ref 4.22–5.81)
RDW: 13.5 % (ref 11.5–15.5)
WBC: 7.4 10*3/uL (ref 4.0–10.5)

## 2019-04-24 LAB — URINALYSIS, ROUTINE W REFLEX MICROSCOPIC
Bilirubin Urine: NEGATIVE
Hgb urine dipstick: NEGATIVE
Ketones, ur: NEGATIVE
Leukocytes,Ua: NEGATIVE
Nitrite: NEGATIVE
RBC / HPF: NONE SEEN (ref 0–?)
Specific Gravity, Urine: 1.02 (ref 1.000–1.030)
Total Protein, Urine: NEGATIVE
Urine Glucose: NEGATIVE
Urobilinogen, UA: 0.2 (ref 0.0–1.0)
pH: 7 (ref 5.0–8.0)

## 2019-04-24 LAB — TSH: TSH: 3.32 u[IU]/mL (ref 0.35–4.50)

## 2019-04-24 MED ORDER — LEVOTHYROXINE SODIUM 125 MCG PO TABS: ORAL_TABLET | ORAL | 1 refills | Status: DC

## 2019-05-01 ENCOUNTER — Telehealth: Payer: Self-pay

## 2019-05-01 NOTE — Telephone Encounter (Signed)
Key: [id number]   PA has been approved.   Mychart message sent informing of same.

## 2019-05-11 ENCOUNTER — Encounter: Payer: Self-pay | Admitting: Neurology

## 2019-05-11 ENCOUNTER — Telehealth (INDEPENDENT_AMBULATORY_CARE_PROVIDER_SITE_OTHER): Payer: Medicare Other | Admitting: Neurology

## 2019-05-11 ENCOUNTER — Other Ambulatory Visit: Payer: Self-pay

## 2019-05-11 VITALS — BP 128/68 | Ht 70.0 in | Wt 177.0 lb

## 2019-05-11 DIAGNOSIS — G620 Drug-induced polyneuropathy: Secondary | ICD-10-CM

## 2019-05-11 DIAGNOSIS — R4789 Other speech disturbances: Secondary | ICD-10-CM

## 2019-05-11 DIAGNOSIS — R42 Dizziness and giddiness: Secondary | ICD-10-CM

## 2019-05-11 DIAGNOSIS — R252 Cramp and spasm: Secondary | ICD-10-CM

## 2019-05-11 NOTE — Progress Notes (Signed)
Virtual Visit via Video Note The purpose of this virtual visit is to provide medical care while limiting exposure to the novel coronavirus.    Consent was obtained for video visit:  Yes.   Answered questions that patient had about telehealth interaction:  Yes.   I discussed the limitations, risks, security and privacy concerns of performing an evaluation and management service by telemedicine. I also discussed with the patient that there may be a patient responsible charge related to this service. The patient expressed understanding and agreed to proceed.  Pt location: Home Physician Location: office Name of referring provider:  Noralee Space, MD I connected with Gregory [name] at patients initiation/request on 05/11/2019 at  1:30 PM EDT by video enabled telemedicine application and verified that I am speaking with the correct person using two identifiers. Pt MRN:  [id number] Pt DOB:  02/22/1941 Video Participants:  Gregory [name]   History of Present Illness: This is a 78 y.o. male with history of hypothyroidism, hyperlipidemia, Osler-Weber- Rendu disease, GERD, and left external basal ganglia polymicrobial abscess (2016) returning for follow-up of right sided spasticity and neuropathy.  He has a few concerns including worsening imbalance with turns.  At baseline, he walks unassisted and does not have difficulty walking.  He feels that his imbalance is only with abrupt turns.  He does have some associated dizziness.  He continues to have numbness of the left arm and leg, which is relatively unchanged from before.  Further, the numbness of his toes remains unchanged.  He continues to have some word finding difficulty with names, which has been stable over the past year.[name]  He remains highly independent managing his own medications, finances, and continues to drive.  Observations/Objective:   Vitals:   05/11/19 0903  BP: 128/68  Weight: 177 lb (80.3 kg)  Height: 5\' 10"  (1.778 m)    Patient is awake, alert, and appears comfortable.  Oriented x 4.   Extraocular muscles are intact. No ptosis.  Mild flattening of the right nasolabial fold.  Speech is not dysarthric. Tongue is midline. Antigravity in all extremities.  No pronator drift. Gait appears normal, turns with < 3 steps.  He is able to balance on each leg without difficulty.    Assessment and Plan:  1.  Gait unsteadiness with turning only. He has associated dizziness, which raises concern for vestibular process.  His exam looks remarkably well and does not have signs of sensory ataxia.  I recommended that he start doing balance training exercises.  If no improvement, vestibular therapy is the next step.  He will contact my office if symptoms do not improve within the next few months.   2.  Drug induced neuropathy, stable and mild.  Continue to monitor 3.  Word finding difficulty, most likely age-related.  Patient reassured that he does not have signs of dementia.  If symptoms get worse, neurocognitive testing can be performed 4.  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     Follow Up Instructions:   I discussed the assessment and treatment plan with the patient. The patient was provided an opportunity to ask questions and all were answered. The patient agreed with the plan and demonstrated an understanding of the instructions.   The patient was advised to call back or seek an in-person evaluation if the symptoms worsen or if the condition fails to improve as anticipated.  Follow-up in 1 year  or sooner as needed  Total time spent:  25 minutes     Alda Berthold, DO

## 2019-05-29 ENCOUNTER — Other Ambulatory Visit: Payer: Self-pay

## 2019-05-29 ENCOUNTER — Encounter (HOSPITAL_BASED_OUTPATIENT_CLINIC_OR_DEPARTMENT_OTHER): Payer: Self-pay

## 2019-05-29 ENCOUNTER — Emergency Department (HOSPITAL_BASED_OUTPATIENT_CLINIC_OR_DEPARTMENT_OTHER): Payer: Medicare Other

## 2019-05-29 ENCOUNTER — Emergency Department (HOSPITAL_BASED_OUTPATIENT_CLINIC_OR_DEPARTMENT_OTHER)
Admission: EM | Admit: 2019-05-29 | Discharge: 2019-05-29 | Disposition: A | Discharge status: Home / Self Care | Payer: Medicare Other | Attending: Emergency Medicine | Admitting: Emergency Medicine

## 2019-05-29 DIAGNOSIS — R51 Headache: Secondary | ICD-10-CM | POA: Diagnosis not present

## 2019-05-29 DIAGNOSIS — Z85828 Personal history of other malignant neoplasm of skin: Secondary | ICD-10-CM | POA: Diagnosis not present

## 2019-05-29 DIAGNOSIS — E039 Hypothyroidism, unspecified: Secondary | ICD-10-CM | POA: Diagnosis not present

## 2019-05-29 DIAGNOSIS — R1012 Left upper quadrant pain: Secondary | ICD-10-CM | POA: Insufficient documentation

## 2019-05-29 DIAGNOSIS — W19XXXA Unspecified fall, initial encounter: Secondary | ICD-10-CM | POA: Diagnosis not present

## 2019-05-29 DIAGNOSIS — Y92018 Other place in single-family (private) house as the place of occurrence of the external cause: Secondary | ICD-10-CM | POA: Diagnosis not present

## 2019-05-29 DIAGNOSIS — Y9301 Activity, walking, marching and hiking: Secondary | ICD-10-CM | POA: Insufficient documentation

## 2019-05-29 DIAGNOSIS — Y998 Other external cause status: Secondary | ICD-10-CM | POA: Diagnosis not present

## 2019-05-29 DIAGNOSIS — Z79899 Other long term (current) drug therapy: Secondary | ICD-10-CM | POA: Insufficient documentation

## 2019-05-29 LAB — CBC WITH DIFFERENTIAL/PLATELET
Abs Immature Granulocytes: 0.03 10*3/uL (ref 0.00–0.07)
Basophils Absolute: 0 10*3/uL (ref 0.0–0.1)
Basophils Relative: 0 %
Eosinophils Absolute: 0.1 10*3/uL (ref 0.0–0.5)
Eosinophils Relative: 1 %
HCT: 44.7 % (ref 39.0–52.0)
Hemoglobin: 15.5 g/dL (ref 13.0–17.0)
Immature Granulocytes: 0 %
Lymphocytes Relative: 22 %
Lymphs Abs: 1.8 10*3/uL (ref 0.7–4.0)
MCH: 30.7 pg (ref 26.0–34.0)
MCHC: 34.7 g/dL (ref 30.0–36.0)
MCV: 88.5 fL (ref 80.0–100.0)
Monocytes Absolute: 1 10*3/uL (ref 0.1–1.0)
Monocytes Relative: 12 %
Neutro Abs: 5.3 10*3/uL (ref 1.7–7.7)
Neutrophils Relative %: 65 %
Platelets: 256 10*3/uL (ref 150–400)
RBC: 5.05 MIL/uL (ref 4.22–5.81)
RDW: 12.9 % (ref 11.5–15.5)
WBC: 8.2 10*3/uL (ref 4.0–10.5)
nRBC: 0 % (ref 0.0–0.2)

## 2019-05-29 LAB — BASIC METABOLIC PANEL
Anion gap: 8 (ref 5–15)
BUN: 16 mg/dL (ref 8–23)
CO2: 25 mmol/L (ref 22–32)
Calcium: 8.9 mg/dL (ref 8.9–10.3)
Chloride: 95 mmol/L — ABNORMAL LOW (ref 98–111)
Creatinine, Ser: 1.16 mg/dL (ref 0.61–1.24)
GFR calc Af Amer: >60 mL/min (ref >60)
GFR calc non Af Amer: >60 mL/min (ref >60)
Glucose, Bld: 97 mg/dL (ref 70–99)
Potassium: 4.6 mmol/L (ref 3.5–5.1)
Sodium: 128 mmol/L — ABNORMAL LOW (ref 135–145)

## 2019-05-29 MED ORDER — ACETAMINOPHEN 500 MG PO TABS
1000.0000 mg | ORAL_TABLET | Freq: Once | ORAL | Status: AC
  Administered 2019-05-29: 1000 mg via ORAL
  Filled 2019-05-29: qty 2

## 2019-05-29 MED ORDER — IOHEXOL 300 MG/ML  SOLN
100.0000 mL | Freq: Once | INTRAMUSCULAR | Status: AC | PRN
  Administered 2019-05-29: 100 mL via INTRAVENOUS

## 2019-05-29 NOTE — ED Provider Notes (Signed)
[facility] EMERGENCY DEPARTMENT Provider Note   CSN: [id number] Arrival date & time: 05/29/19  1438    History   Chief Complaint Chief Complaint  Patient presents with   Fall    HPI Gregory ZAHLER is a 78 y.o. male.     78 yo M with chief complaints of a fall.  Patient states that he has had some instability with his gait since he was diagnosed with a brain abscess about 4 years ago.  This is the first time that he is fallen with it.  Felt like he lost his balance and then went down when he was walking down the stairs.  Struck his head on the ground.  He also hit both of his elbows.  Tetanus is up-to-date.  Having some left upper quadrant abdominal pain.  He denies other injury denies neck pain denies back pain denies shortness of breath.  Denies lower extremity pain.  The history is provided by the patient.  Fall  This is a new problem. The current episode started 1 to 2 hours ago. The problem occurs constantly. The problem has not changed since onset.Associated symptoms include headaches. Pertinent negatives include no chest pain, no abdominal pain and no shortness of breath. Nothing aggravates the symptoms. Nothing relieves the symptoms. He has tried nothing for the symptoms. The treatment provided no relief.    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   History of nonmelanoma skin cancer EXCISION SQUAMOUS CELL FROM HAND   Hypertrophy of prostate with urinary obstruction and other lower urinary tract symptoms (LUTS)    Irritable bowel syndrome    Lumbago    Other diseases of nasal cavity and sinuses(478.19)    Persistent disorder of initiating or maintaining sleep    Plantar fascial fibromatosis    Pure hypercholesterolemia    Telangiectasia, hereditary  hemorrhagic, of Rendu, 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  ([facility])   SKIN, LIPS, NASAL W/ PREVIOUS NOSE BLEEDS  AMD GI TELANGIECTASIA   Unspecified hypothyroidism     Patient Active Problem List   Diagnosis Date Noted   Laryngopharyngeal reflux (LPR) 01/29/2019   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   Seasonal allergic rhinitis due to pollen 06/27/2018   Peripheral neuropathy 01/01/2016   Atrial flutter ([place]) 08/07/2015   Encounter for therapeutic drug monitoring    Osteoarthritis 04/01/2015   Iron deficiency anemia due to chronic blood loss 10/08/2014   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 96/29/5284   Anxiety state 09/11/2009   HYPERCHOLESTEROLEMIA, MILD 07/25/2008   CARCINOMA, SKIN, SQUAMOUS CELL 02/10/2008   INSOMNIA, CHRONIC 02/10/2008   GERD 02/10/2008   Benign prostatic hyperplasia with urinary obstruction 02/10/2008   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   Hypothyroidism 02/08/2008   Irritable bowel syndrome 02/08/2008   BACK PAIN, LUMBAR 02/08/2008    Past Surgical History:  Procedure Laterality Date   APPLICATION OF CRANIAL NAVIGATION N/A 05/16/2015   Procedure: APPLICATION OF CRANIAL NAVIGATION;  Surgeon: Consuella Lose, MD;  Location: [facility] NEURO [facility];  Service: Neurosurgery;  Laterality: N/A;   BRAIN BIOPSY Left 05/16/2015   Procedure: Stereotactic Left Brain Biopsy with Brain Lab;  Surgeon: Consuella Lose, MD;  Location: [facility] NEUR[facility] ORS;  Service: Neurosurgery;  Laterality: Left;  Stereotactic Left Brain  biopsy with brainlab   BRAIN SURGERY     CARDIOVASCULAR STRESS TEST  01-14-2003   NO ISCHEMIA / EF 61%/ NORMAL LE WALL MOTION   EXCISION LEFT WRIST GANGLIAN/ MYXOID CYST  05-30-2009   IR RADIOLOGIST EVAL & MGMT  02/01/2019   NASAL SINUS SURGERY     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 Renda Rolls   TRANSTHORACIC ECHOCARDIOGRAM  10-17-2008   DR CRENSHAW   NORMAL LVF/ EF 60%/  MILDLY DILATED RIGHT ATRIUM/ VENTRICULE   VARICOCELECTOMY  1991   Dr. Tresa Endo        Home Medications    Prior to Admission medications   Medication Sig Start Date End Date Taking? Authorizing Provider  albuterol (PROAIR HFA) 108 (90 Base) MCG/ACT inhaler Inhale 1-2 puffs into the lungs every 6 (six) hours as needed for wheezing or shortness of breath.    [provider]  amoxicillin (AMOXIL) 500 MG capsule Take 2 capsules (1,000 mg total) by mouth as needed. 04/23/19   Janith Lima, MD  Cholecalciferol (VITAMIN D3) 2000 UNITS TABS Take 2,000 Units by mouth daily.    [provider]  clorazepate (TRANXENE) 7.5 MG tablet TAKE 1 TABLET 3 TIMES A DAY AS NEEDED 02/22/18   Noralee Space, MD  FERROUS SULFATE PO Take 65 mg by mouth 3 (three) times a week. Mon, wed, Fri    [provider]  levocetirizine (XYZAL) 5 MG tablet Take 1 tablet (5 mg total) by mouth every evening. 06/27/18   Janith Lima, MD  levothyroxine (SYNTHROID) 125 MCG tablet Take daily 04/24/19   Janith Lima, MD  Magnesium 250 MG TABS Take 250 mg by mouth at bedtime.     [provider]  meclizine (ANTIVERT) 25 MG tablet Take 1 tablet (25 mg total) by mouth daily as needed for dizziness. 04/23/19   Janith Lima, MD  metaxalone (SKELAXIN) 800 MG tablet Take 1 tablet (800 mg total) by mouth 3 (three) times daily. 04/23/19   Janith Lima, MD  metoprolol succinate (TOPROL-XL) 25 MG 24 hr tablet TAKE 0.5  TABLET BY MOUTH  DAILY 10/18/18   Noralee Space, MD  montelukast (SINGULAIR) 10 MG tablet TAKE 1 TABLET BY MOUTH EVERYDAY AT BEDTIME 04/23/19   Janith Lima, MD  pantoprazole (PROTONIX) 40 MG tablet Take 1 tablet (40 mg total) by mouth 2 (two) times daily. 04/23/19   Janith Lima, MD  silodosin (RAPAFLO)  8 MG CAPS capsule Take 1 capsule (8 mg total) by mouth at bedtime. 04/23/19   Janith Lima, MD  vitamin C (ASCORBIC ACID) 500 MG tablet Take 500 mg 3 (three) times a week by mouth.     [provider]    Family History Family History  Problem Relation Age of Onset   Diabetes Mother    Hypertension Mother    Pancreatic cancer Mother    Stroke Mother    Kidney failure Father    Hypertension Sister    Healthy Son     Social History Social History   Tobacco Use   Smoking status: Never Smoker   Smokeless tobacco: Never Used  Substance Use Topics   Alcohol use: No    Alcohol/week: 0.0 standard drinks   Drug use: No  Allergies   Nsaids; Other; Aspirin; and Statins   Review of Systems Review of Systems  Constitutional: Negative for chills and fever.  HENT: Negative for congestion and facial swelling.   Eyes: Negative for discharge and visual disturbance.  Respiratory: Negative for shortness of breath.   Cardiovascular: Negative for chest pain and palpitations.  Gastrointestinal: Negative for abdominal pain, diarrhea and vomiting.  Musculoskeletal: Positive for arthralgias. Negative for myalgias.  Skin: Negative for color change and rash.  Neurological: Positive for headaches. Negative for tremors and syncope.  Psychiatric/Behavioral: Negative for confusion and dysphoric mood.     Physical Exam Updated Vital Signs BP 138/77 (BP Location: Right Arm)    Pulse 88    Temp 98.8 F (37.1 C) (Oral)    Resp 18    Ht 5\' 10"  (1.778 m)    Wt 83.9 kg    SpO2 98%    BMI 26.54 kg/m   Physical Exam Vitals signs and nursing note reviewed.  Constitutional:      Appearance: He is well-developed.  HENT:     Head: Normocephalic and atraumatic.  Eyes:     Pupils: Pupils are equal, round, and reactive to light.  Neck:     Musculoskeletal: Normal range of motion and neck supple.     Vascular: No JVD.  Cardiovascular:     Rate and Rhythm: Normal rate and  regular rhythm.     Heart sounds: No murmur. No friction rub. No gallop.   Pulmonary:     Effort: No respiratory distress.     Breath sounds: No wheezing.  Abdominal:     General: There is no distension.     Tenderness: There is abdominal tenderness (luq and along the left rib margin). There is no guarding or rebound.  Musculoskeletal: Normal range of motion.        General: Tenderness present.     Comments: Abrasion to bilateral elbows  Skin:    Coloration: Skin is not pale.     Findings: No rash.  Neurological:     Mental Status: He is alert and oriented to person, place, and time.  Psychiatric:        Behavior: Behavior normal.      ED Treatments / Results  Labs (all labs ordered are listed, but only abnormal results are displayed) Labs Reviewed  BASIC METABOLIC PANEL - Abnormal; Notable for the following components:      Result Value   Sodium 128 (*)    Chloride 95 (*)    All other components within normal limits  CBC WITH DIFFERENTIAL/PLATELET    EKG None  Radiology Dg Elbow Complete Left  Result Date: 05/29/2019 CLINICAL DATA:  Elbow pain post fall EXAM: LEFT ELBOW - COMPLETE 3+ VIEW COMPARISON:  None. FINDINGS: No dislocation.  Large elbow effusion.  No definitive lucency seen. IMPRESSION: No discrete fracture lucency, however large elbow effusion is present and occult fracture remains a concern. Electronically Signed   By: Donavan Foil M.D.   On: 05/29/2019 19:15   Ct Head Wo Contrast  Result Date: 05/29/2019 CLINICAL DATA:  Fall. EXAM: CT HEAD WITHOUT CONTRAST TECHNIQUE: Contiguous axial images were obtained from the base of the skull through the vertex without intravenous contrast. COMPARISON:  CT head dated January 21, 2017. FINDINGS: Brain: No evidence of acute infarction, hemorrhage, hydrocephalus, extra-axial collection or mass lesion/mass effect. Unchanged focal small area of encephalomalacia in the left external capsule at the site of prior cerebral abscess.  Vascular: No hyperdense  vessel or unexpected calcification. Skull: Negative for fracture or focal lesion. Left frontal burr hole again noted. Sinuses/Orbits: No acute finding. Other: None. IMPRESSION: 1.  No acute intracranial abnormality. Electronically Signed   By: Titus Dubin M.D.   On: 05/29/2019 17:24   Ct Abdomen Pelvis W Contrast  Result Date: 05/29/2019 CLINICAL DATA:  Left-sided rib pain after fall. EXAM: CT ABDOMEN AND PELVIS WITH CONTRAST TECHNIQUE: Multidetector CT imaging of the abdomen and pelvis was performed using the standard protocol following bolus administration of intravenous contrast. CONTRAST:  170mL OMNIPAQUE IOHEXOL 300 MG/ML  SOLN COMPARISON:  CT abdomen pelvis dated December 15, 2018. FINDINGS: Lower chest: No acute abnormality. Unchanged 5.3 x 3.7 cm AVM in the right lower lobe. Unchanged 5 mm subpleural AVM in the left lower lobe. Hepatobiliary: No hepatic injury or perihepatic hematoma. Gallbladder is unremarkable. No biliary dilatation. Pancreas: Unremarkable. No pancreatic ductal dilatation or surrounding inflammatory changes. Spleen: No splenic injury or perisplenic hematoma. Adrenals/Urinary Tract: No adrenal hemorrhage or renal injury identified. No renal calculi or hydronephrosis. Bladder is unremarkable. Stomach/Bowel: Stomach is within normal limits. Appendix is not identified, but there are no signs of inflammation at the base of the cecum. No evidence of bowel wall thickening, distention, or inflammatory changes. Vascular/Lymphatic: Aortic atherosclerosis. No enlarged abdominal or pelvic lymph nodes. Reproductive: Unchanged mild prostatomegaly. Other: No abdominal wall hernia or abnormality. No abdominopelvic ascites. No pneumoperitoneum. Musculoskeletal: No acute or significant osseous findings. 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 IMPRESSION: 1. No evidence of acute traumatic injury within the abdomen or pelvis. 2.  Unchanged bilateral lower lobe pulmonary AVMs. Electronically Signed   By: Titus Dubin M.D.   On: 05/29/2019 17:33    Procedures Procedures (including critical care time)  Medications Ordered in ED Medications  iohexol (OMNIPAQUE) 300 MG/ML solution 100 mL (100 mLs Intravenous Contrast Given 05/29/19 1641)  acetaminophen (TYLENOL) tablet 1,000 mg (1,000 mg Oral Given 05/29/19 1845)     Initial Impression / Assessment and Plan / ED Course  I have reviewed the triage vital signs and the nursing notes.  Pertinent labs & imaging results that were available during my care of the patient were reviewed by me and considered in my medical decision making (see chart for details).        78 yo M with chief complaints of a fall.  Sounds mechanical in nature.  Patient struck his head and is worried about them.  Will obtain a CT of the head.  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  I will obtain a CT scan of his abdomen pelvis with contrast as he is having left upper quadrant abdominal pain on my exam.  CBC without anemia.  Patient appears mildly dehydrated with hyponatremia and hypo-chloridemia  CT scan is negative for intra-abdominal pathology.  No intracranial pathology either.  On reassessment unfortunately the patient's elbow that was just mildly sore but had full range of motion now is become stiff and is more tender.  Plain film without an obvious fracture though a significant intra-articular swelling.  Will place in a sling.  Orthopedic follow-up.  10:26 PM:  I have discussed the diagnosis/risks/treatment options with the patient and believe the pt to be eligible for discharge home to follow-up with PCP, ortho. We also discussed returning to the ED immediately if new or worsening sx occur. We discussed the sx which are most concerning (e.g., sudden worsening pain, fever,  inability to tolerate by mouth) that necessitate immediate return. Medications administered to  the patient during their visit and any new prescriptions provided to the patient are listed below.  Medications given during this visit Medications  iohexol (OMNIPAQUE) 300 MG/ML solution 100 mL (100 mLs Intravenous Contrast Given 05/29/19 1641)  acetaminophen (TYLENOL) tablet 1,000 mg (1,000 mg Oral Given 05/29/19 1845)     The patient appears reasonably screen and/or stabilized for discharge and I doubt any other medical condition or other [facility] requiring further screening, evaluation, or treatment in the ED at this time prior to discharge.    Final Clinical Impressions(s) / ED Diagnoses   Final diagnoses:  Fall, initial encounter    ED Discharge Orders    None       Deno Etienne, DO 05/29/19 2226

## 2019-05-29 NOTE — Discharge Instructions (Addendum)
Follow up with your doctor.  Return for worsening headache, confusion, shortness of breath or fever.   Your x-ray shows no obvious fracture of the elbow.  We will give you a sling for comfort.  Follow-up with orthopedics as needed for this.

## 2019-05-29 NOTE — ED Triage Notes (Addendum)
Pt states he tripped/fell on steps-pain to left rib, upper forehead-no break in skin-no LOC-abrasions noted to bilat elbows-NAD-steady gait

## 2019-05-30 ENCOUNTER — Telehealth: Payer: Self-pay | Admitting: Internal Medicine

## 2019-05-30 NOTE — Telephone Encounter (Signed)
Copied from [place] [id number]. Topic: General - Other >> May 30, 2019  9:35 AM Valla Leaver wrote: Reason for CRM: Patient had a fall on his steps yesterday and was seen at [facility] ED and they advised him to f/u with PCP to see if Ronnald Ramp wants to see him or has any concerns?

## 2019-05-30 NOTE — Telephone Encounter (Signed)
Scheduled pt 6/23

## 2019-06-05 ENCOUNTER — Other Ambulatory Visit: Payer: Self-pay

## 2019-06-05 ENCOUNTER — Encounter: Payer: Self-pay | Admitting: Family

## 2019-06-05 ENCOUNTER — Ambulatory Visit (INDEPENDENT_AMBULATORY_CARE_PROVIDER_SITE_OTHER): Payer: Medicare Other | Admitting: Family

## 2019-06-05 ENCOUNTER — Ambulatory Visit (INDEPENDENT_AMBULATORY_CARE_PROVIDER_SITE_OTHER)
Admission: RE | Admit: 2019-06-05 | Discharge: 2019-06-05 | Disposition: A | Discharge status: Home / Self Care | Payer: Medicare Other | Source: Ambulatory Visit | Attending: Family | Admitting: Family

## 2019-06-05 VITALS — BP 130/70 | HR 63 | Temp 97.8°F | Ht 70.0 in | Wt 185.8 lb

## 2019-06-05 DIAGNOSIS — M79602 Pain in left arm: Secondary | ICD-10-CM

## 2019-06-05 MED ORDER — DOXYCYCLINE HYCLATE 100 MG PO TABS: 100.0000 mg | ORAL_TABLET | Freq: Two times a day (BID) | ORAL | 0 refills | Status: DC

## 2019-06-05 NOTE — Progress Notes (Signed)
Gregory [name] is a 77 y.o. male with the following history as recorded in Epic[employer]:  Patient Active Problem List   Diagnosis Date Noted  . Laryngopharyngeal reflux (LPR) 01/29/2019  . 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 Seasonal allergic rhinitis due to pollen 06/27/2018  . Peripheral neuropathy 01/01/2016  . Atrial flutter ([place]) 08/07/2015  . Encounter for therapeutic drug monitoring   . Osteoarthritis 04/01/2015  . Iron deficiency anemia due to chronic blood loss 10/08/2014  . 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 Anxiety state 09/11/2009  . HYPERCHOLESTEROLEMIA, MILD 07/25/2008  . CARCINOMA, SKIN, SQUAMOUS CELL 02/10/2008  . INSOMNIA, CHRONIC 02/10/2008  . GERD 02/10/2008  . Benign prostatic hyperplasia with urinary obstruction 02/10/2008  . 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 Hypothyroidism 02/08/2008  . Irritable bowel syndrome 02/08/2008  . BACK PAIN, LUMBAR 02/08/2008    Current Outpatient Medications  Medication Sig Dispense Refill  . albuterol (PROAIR HFA) 108 (90 Base) MCG/ACT inhaler Inhale 1-2 puffs into the lungs every 6 (six) hours as needed for wheezing or shortness of breath.    [name] amoxicillin (AMOXIL) 500 MG capsule Take 2 capsules (1,000 mg total) by mouth as needed. 2 capsule 3  . Cholecalciferol (VITAMIN D3) 2000 UNITS TABS Take 2,000 Units by mouth daily.    . clorazepate (TRANXENE) 7.5 MG tablet TAKE 1 TABLET 3 TIMES A DAY AS NEEDED 90 tablet 5  . FERROUS SULFATE PO Take 65 mg by mouth 3 (three) times a week. Mon, wed, Fri    . levocetirizine (XYZAL) 5 MG tablet Take 1 tablet (5 mg total) by mouth every evening. 90 tablet 1  . levothyroxine (SYNTHROID) 125 MCG tablet Take daily 90 tablet 1  . Magnesium 250 MG TABS Take 250 mg by mouth at bedtime.     . meclizine (ANTIVERT) 25 MG tablet Take 1 tablet (25 mg total) by mouth daily as needed for  dizziness. 30 tablet 5  . metaxalone (SKELAXIN) 800 MG tablet Take 1 tablet (800 mg total) by mouth 3 (three) times daily. 180 tablet 1  . metoprolol succinate (TOPROL-XL) 25 MG 24 hr tablet TAKE 0.5  TABLET BY MOUTH  DAILY 90 tablet 2  . montelukast (SINGULAIR) 10 MG tablet TAKE 1 TABLET BY MOUTH EVERYDAY AT BEDTIME 90 tablet 1  . pantoprazole (PROTONIX) 40 MG tablet Take 1 tablet (40 mg total) by mouth 2 (two) times daily. 180 tablet 1  . silodosin (RAPAFLO) 8 MG CAPS capsule Take 1 capsule (8 mg total) by mouth at bedtime. 90 capsule 1  . tobramycin (TOBREX) 0.3 % ophthalmic solution INSTILL 1 DROP INTO LEFT EYE FOUR TIMES A DAY AS DIRECTED    . vitamin C (ASCORBIC ACID) 500 MG tablet Take 500 mg 3 (three) times a week by mouth.     . doxycycline (VIBRA-TABS) 100 MG tablet Take 1 tablet (100 mg total) by mouth 2 (two) times daily. 14 tablet 0   No current facility-administered medications for this visit.     Allergies: Nsaids, Other, Aspirin, and Statins  Past Medical History:  Diagnosis Date  . Anxiety state, unspecified   . Arthritis   . Benign neoplasm of colon   . GERD (gastroesophageal reflux disease)   . 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 History of nonmelanoma skin cancer EXCISION SQUAMOUS CELL FROM HAND  . Hypertrophy of prostate with urinary obstruction and other lower urinary tract  symptoms (LUTS)   . Irritable bowel syndrome   . Lumbago   . Other diseases of nasal cavity and sinuses(478.19)   . Persistent disorder of initiating or maintaining sleep   . Plantar fascial fibromatosis   . Pure hypercholesterolemia   . 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 Unspecified hypothyroidism     Past Surgical History:  Procedure Laterality Date  . APPLICATION OF CRANIAL NAVIGATION N/A 05/16/2015   Procedure:  APPLICATION OF CRANIAL NAVIGATION;  Surgeon: Consuella Lose, MD;  Location: [facility];  Service: Neurosurgery;  Laterality: N/A;  . BRAIN BIOPSY Left 05/16/2015   Procedure: Stereotactic Left Brain Biopsy with Brain Lab;  Surgeon: Consuella Lose, MD;  Location: [facility];  Service: Neurosurgery;  Laterality: Left;  Stereotactic Left Brain biopsy with brainlab  . BRAIN SURGERY    . CARDIOVASCULAR STRESS TEST  01-14-2003   NO ISCHEMIA / EF 61%/ NORMAL LE WALL MOTION  . EXCISION LEFT WRIST GANGLIAN/ MYXOID CYST  05-30-2009  . IR RADIOLOGIST EVAL & MGMT  02/01/2019  . 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 OTHER SURGICAL HISTORY     pulse laser for facial telangiectasias inthe past  . removal of skin cancer from forehead  10/2012   Dr. Renda Rolls  . TRANSTHORACIC ECHOCARDIOGRAM  10-17-2008   DR CRENSHAW   NORMAL LVF/ EF 60%/  MILDLY DILATED RIGHT ATRIUM/ VENTRICULE  . VARICOCELECTOMY  1991   Dr. Tresa Endo    Family History  Problem Relation Age of Onset  . Diabetes Mother   . Hypertension Mother   . Pancreatic cancer Mother   . Stroke Mother   . Kidney failure Father   . Hypertension Sister   . Healthy Son     Social History   Tobacco Use  . Smoking status: Never Smoker  . Smokeless tobacco: Never Used  Substance Use Topics  . Alcohol use: No    Alcohol/week: 0.0 standard drinks    Subjective:  Patient fell at home last week-tripped on set of stairs at his home and used his left arm/ left side of his body to break the fall; was seen at the ER and recommended for close orthopedic and PCP follow-up; is scheduled to see his PCP next week; Became concerned last night when left forearm became red, looked streaky last night; has been treating wound on left elbow with OTC ointment and bandage; In reviewing images, there was concern for possible fracture of the left elbow due to the amount of swelling noted on original X-ray.     Objective:  Vitals:   06/05/19 1203  BP: 130/70  Pulse: 63  Temp: 97.8 F (36.6 C)  TempSrc: Oral  SpO2: 94%  Weight: 185 lb 12.8 oz (84.3 kg)  Height: 5\' 10"  (1.778 m)    General: Well developed, well nourished, in no acute distress  Skin : Warm and dry.  Head: Normocephalic and atraumatic  Lungs: Respirations unlabored; clear to auscultation bilaterally without wheeze, rales, rhonchi  Musculoskeletal: No deformities; marked swelling noted over left elbow; full range of motion;  Extremities: No edema, cyanosis, clubbing  Vessels: Symmetric bilaterally  Neurologic: Alert and oriented; speech intact; face symmetrical; moves all extremities well; CNII-XII intact without focal deficit  Assessment:  1. Left arm pain     Plan:  Update x-rays of left elbow and left forearm; start Doxycyline 100 mg bid x 7 days to cover for skin infection; encouraged to keep bandaged and covered; follow-up to be determined; regardless, keep planned follow-up with his PCP for next week.   No follow-ups on file.  Orders Placed This Encounter  Procedures  . DG Forearm Left    Standing Status:   Future    Number of Occurrences:   1    Standing Expiration Date:   08/04/2020    Order Specific Question:   Reason for Exam (SYMPTOM  OR DIAGNOSIS REQUIRED)    Answer:   left arm pain/ swelling injury    Order Specific Question:   Preferred imaging location?    Answer:   [facility]    Order Specific Question:   Radiology Contrast Protocol - do NOT remove file path    Answer:   \\[email or web address]  . DG Elbow Complete Left    Standing Status:   Future    Number of Occurrences:   1    Standing Expiration Date:   08/04/2020    Order Specific Question:   Reason for Exam (SYMPTOM  OR DIAGNOSIS REQUIRED)    Answer:   left elbow pain    Order Specific Question:   Preferred imaging location?    Answer:   [facility]    Order Specific Question:   Radiology Contrast  Protocol - do NOT remove file path    Answer:   \\[email or web address]    Requested Prescriptions   Signed Prescriptions Disp Refills  . doxycycline (VIBRA-TABS) 100 MG tablet 14 tablet 0    Sig: Take 1 tablet (100 mg total) by mouth 2 (two) times daily.

## 2019-06-06 ENCOUNTER — Ambulatory Visit: Payer: Medicare Other | Admitting: Neurology

## 2019-06-06 ENCOUNTER — Ambulatory Visit: Payer: Medicare Other | Admitting: Internal Medicine

## 2019-06-12 ENCOUNTER — Encounter: Payer: Self-pay | Admitting: Internal Medicine

## 2019-06-12 ENCOUNTER — Other Ambulatory Visit: Payer: Self-pay

## 2019-06-12 ENCOUNTER — Ambulatory Visit (INDEPENDENT_AMBULATORY_CARE_PROVIDER_SITE_OTHER)
Admission: RE | Admit: 2019-06-12 | Discharge: 2019-06-12 | Disposition: A | Discharge status: Home / Self Care | Payer: Medicare Other | Source: Ambulatory Visit | Attending: Internal Medicine | Admitting: Internal Medicine

## 2019-06-12 ENCOUNTER — Ambulatory Visit: Payer: Medicare Other | Admitting: Internal Medicine

## 2019-06-12 ENCOUNTER — Ambulatory Visit (INDEPENDENT_AMBULATORY_CARE_PROVIDER_SITE_OTHER): Payer: Medicare Other | Admitting: Internal Medicine

## 2019-06-12 VITALS — BP 144/68 | HR 63 | Temp 97.4°F | Resp 16 | Ht 70.0 in | Wt 184.8 lb

## 2019-06-12 DIAGNOSIS — S298XXD Other specified injuries of thorax, subsequent encounter: Secondary | ICD-10-CM | POA: Diagnosis not present

## 2019-06-12 DIAGNOSIS — S20212S Contusion of left front wall of thorax, sequela: Secondary | ICD-10-CM | POA: Diagnosis not present

## 2019-06-12 DIAGNOSIS — S298XXA Other specified injuries of thorax, initial encounter: Secondary | ICD-10-CM | POA: Insufficient documentation

## 2019-06-12 DIAGNOSIS — S20212A Contusion of left front wall of thorax, initial encounter: Secondary | ICD-10-CM | POA: Insufficient documentation

## 2019-06-12 MED ORDER — OXYCODONE HCL 5 MG PO TABS: 5.0000 mg | ORAL_TABLET | ORAL | 0 refills | Status: DC | PRN

## 2019-06-12 NOTE — Patient Instructions (Signed)
Rib Contusion  A rib contusion is a deep bruise on your rib area. Contusions are the result of a blunt trauma that causes bleeding and injury to the tissues under the skin. A rib contusion may involve bruising of the ribs and of the skin and muscles in the area. The skin over the contusion may turn blue, purple, or yellow. Minor injuries will give you a painless contusion. More severe contusions may stay painful and swollen for a few weeks.  What are the causes?  This condition is usually caused by a blow, trauma, or direct force to an area of the body. This often occurs while playing contact sports.  What are the signs or symptoms?  Symptoms of this condition include:   Swelling and redness of the injured area.   Discoloration of the injured area.   Tenderness and soreness of the injured area.   Pain with or without movement.  How is this diagnosed?  This condition may be diagnosed based on:   Your symptoms and medical history.   A physical exam.   Imaging tests-such as an X-ray, CT scan, or MRI-to determine if there were internal injuries or broken bones (fractures).  How is this treated?  This condition may be treated with:   Rest. This is often the best treatment for a rib contusion.   Icing. This reduces swelling and inflammation.   Deep-breathing exercises. These may be recommended to reduce the risk for lung collapse and pneumonia.   Medicines. Over-the-counter or prescription medicines may be given to control pain.   Injection of a numbing medicine around the nerve near your injury (nerve block).  Follow these instructions at home:         Medicines   Take over-the-counter and prescription medicines only as told by your health care provider.   Do not drive or use heavy machinery while taking prescription pain medicine.   If you are taking prescription pain medicine, take actions to prevent or treat constipation. Your health care provider may recommend that you:  ? Drink enough fluid to keep  your urine pale yellow.  ? Eat foods that are high in fiber, such as fresh fruits and vegetables, whole grains, and beans.  ? Limit foods that are high in fat and processed sugars, such as fried or sweet foods.  ? Take an over-the-counter or prescription medicine for constipation.  Managing pain, stiffness, and swelling   If directed, put ice on the injured area:  ? Put ice in a plastic bag.  ? Place a towel between your skin and the bag.  ? Leave the ice on for 20 minutes, 2-3 times a day.   Rest the injured area. Avoid strenuous activity and any activities or movements that cause pain. Be careful during activities and avoid bumping the injured area.   Do not lift anything that is heavier than 5 lb (2.3 kg), or the limit that you are told, until your health care provider says that it is safe.  General instructions   Do not use any products that contain nicotine or tobacco, such as cigarettes and e-cigarettes. These can delay healing. If you need help quitting, ask your health care provider.   Do deep-breathing exercises as told by your health care provider.   If you were given an incentive spirometer, use it every 1-2 hours while you are awake, or as recommended by your health care provider. This device measures how well you are filling your lungs with each breath.     Keep all follow-up visits as told by your health care provider. This is important.  Contact a health care provider if you have:   Increased bruising or swelling.   Pain that is not controlled with treatment.   A fever.  Get help right away if you:   Have difficulty breathing or shortness of breath.   Develop a continual cough or you cough up thick or bloody sputum.   Feel nauseous or you vomit.   Have pain in your abdomen.  Summary   A rib contusion is a deep bruise on your rib area. Contusions are the result of a blunt trauma that causes bleeding and injury to the tissues under the skin.   The skin overlying the contusion may turn  blue, purple, or yellow. Minor injuries may give you a painless contusion. More severe contusions may stay painful and swollen for a few weeks.   Rest the injured area. Avoid strenuous activity and any activities or movements that cause pain.  This information is not intended to replace advice given to you by your health care provider. Make sure you discuss any questions you have with your health care provider.  Document Released: 08/31/2001 Document Revised: 01/04/2018 Document Reviewed: 01/04/2018  Elsevier Interactive Patient Education  2019 [employer]

## 2019-06-12 NOTE — Progress Notes (Signed)
Subjective:  Patient ID: Gregory [name], male    DOB: 11/19/1941  Age: 78 y.o. MRN: [id number]  CC: Chest Pain   HPI Gregory [name] presents for f/up - He fell almost 2 weeks ago and landed on his left side.  He was seen in the ED and another provider and a CT scan of the brain was unremarkable and plain films of his left elbow and forearm are negative for fracture.  There was some concern that he might be developing a wound infection over his elbow so he has taken a course of doxycycline.  He comes in today and tells me that the elbow has healed up nicely but now he is concerned about persistent left-sided chest wall pain.  He says the pain interferes with his activity level and his ability to sleep.  He has not gotten much symptom relief with acetaminophen and a muscle relaxer.  Outpatient Medications Prior to Visit  Medication Sig Dispense Refill   albuterol (PROAIR HFA) 108 (90 Base) MCG/ACT inhaler Inhale 1-2 puffs into the lungs every 6 (six) hours as needed for wheezing or shortness of breath.     amoxicillin (AMOXIL) 500 MG capsule Take 2 capsules (1,000 mg total) by mouth as needed. 2 capsule 3   Cholecalciferol (VITAMIN D3) 2000 UNITS TABS Take 2,000 Units by mouth daily.     FERROUS SULFATE PO Take 65 mg by mouth 3 (three) times a week. Mon, wed, Fri     levocetirizine (XYZAL) 5 MG tablet Take 1 tablet (5 mg total) by mouth every evening. 90 tablet 1   levothyroxine (SYNTHROID) 125 MCG tablet Take daily 90 tablet 1   Magnesium 250 MG TABS Take 250 mg by mouth at bedtime.      meclizine (ANTIVERT) 25 MG tablet Take 1 tablet (25 mg total) by mouth daily as needed for dizziness. 30 tablet 5   metaxalone (SKELAXIN) 800 MG tablet Take 1 tablet (800 mg total) by mouth 3 (three) times daily. 180 tablet 1   metoprolol succinate (TOPROL-XL) 25 MG 24 hr tablet TAKE 0.5  TABLET BY MOUTH  DAILY 90 tablet 2   montelukast (SINGULAIR) 10 MG tablet TAKE 1 TABLET BY MOUTH  EVERYDAY AT BEDTIME 90 tablet 1   pantoprazole (PROTONIX) 40 MG tablet Take 1 tablet (40 mg total) by mouth 2 (two) times daily. 180 tablet 1   silodosin (RAPAFLO) 8 MG CAPS capsule Take 1 capsule (8 mg total) by mouth at bedtime. 90 capsule 1   vitamin C (ASCORBIC ACID) 500 MG tablet Take 500 mg 3 (three) times a week by mouth.      clorazepate (TRANXENE) 7.5 MG tablet TAKE 1 TABLET 3 TIMES A DAY AS NEEDED 90 tablet 5   doxycycline (VIBRA-TABS) 100 MG tablet Take 1 tablet (100 mg total) by mouth 2 (two) times daily. 14 tablet 0   tobramycin (TOBREX) 0.3 % ophthalmic solution INSTILL 1 DROP INTO LEFT EYE FOUR TIMES A DAY AS DIRECTED     No facility-administered medications prior to visit.     ROS Review of Systems  Constitutional: Negative.  Negative for diaphoresis and fatigue.  HENT: Negative.   Respiratory: Negative for cough, chest tightness, shortness of breath and wheezing.   Cardiovascular: Positive for chest pain. Negative for palpitations and leg swelling.  Gastrointestinal: Negative for abdominal pain, constipation, diarrhea, nausea and vomiting.  Endocrine: Negative.   Genitourinary: Negative.  Negative for difficulty urinating, frequency and hematuria.  Musculoskeletal: Positive  for arthralgias. Negative for back pain, myalgias and neck pain.  Skin: Positive for wound.  Neurological: Negative.  Negative for dizziness, weakness, light-headedness and headaches.  Hematological: Negative for adenopathy. Does not bruise/bleed easily.  Psychiatric/Behavioral: Negative.     Objective:  BP (!) 144/68 (BP Location: Left Arm, Patient Position: Sitting, Cuff Size: Normal)    Pulse 63    Temp (!) 97.4 F (36.3 C) (Oral)    Resp 16    Ht 5\' 10"  (1.778 m)    Wt 184 lb 12 oz (83.8 kg)    SpO2 94%    BMI 26.51 kg/m   BP Readings from Last 3 Encounters:  06/12/19 (!) 144/68  06/05/19 130/70  05/29/19 138/77    Wt Readings from Last 3 Encounters:  06/12/19 184 lb 12 oz (83.8  kg)  06/05/19 185 lb 12.8 oz (84.3 kg)  05/29/19 185 lb (83.9 kg)    Physical Exam Vitals signs reviewed.  Constitutional:      General: He is not in acute distress.    Appearance: He is not ill-appearing, toxic-appearing or diaphoretic.  HENT:     Head: Normocephalic and atraumatic.     Nose: Nose normal.     Mouth/Throat:     Mouth: Mucous membranes are moist.  Eyes:     General: No scleral icterus.    Conjunctiva/sclera: Conjunctivae normal.  Neck:     Musculoskeletal: Normal range of motion. No neck rigidity.  Cardiovascular:     Rate and Rhythm: Normal rate and regular rhythm.     Heart sounds: No murmur. No gallop.   Pulmonary:     Effort: Pulmonary effort is normal. No respiratory distress.     Breath sounds: No stridor. No wheezing, rhonchi or rales.  Chest:     Chest wall: No tenderness.  Abdominal:     General: Abdomen is flat. Bowel sounds are normal. There is no distension.     Palpations: There is no hepatomegaly.     Tenderness: There is no abdominal tenderness. There is no right CVA tenderness or left CVA tenderness.  Musculoskeletal: Normal range of motion.        General: No swelling, tenderness or deformity.     Left elbow: He exhibits normal range of motion, no swelling, no effusion and no deformity. No tenderness found.  Skin:    General: Skin is warm and dry.  Neurological:     General: No focal deficit present.     Mental Status: He is oriented to person, place, and time. Mental status is at baseline.  Psychiatric:        Mood and Affect: Mood normal.        Behavior: Behavior normal.     Lab Results  Component Value Date   WBC 8.2 05/29/2019   HGB 15.5 05/29/2019   HCT 44.7 05/29/2019   PLT 256 05/29/2019   GLUCOSE 97 05/29/2019   CHOL 145 04/24/2019   TRIG 80.0 04/24/2019   HDL 40.00 04/24/2019   LDLDIRECT 139.4 06/15/2007   LDLCALC 89 04/24/2019   ALT 12 04/24/2019   AST 15 04/24/2019   NA 128 (L) 05/29/2019   K 4.6 05/29/2019    CL 95 (L) 05/29/2019   CREATININE 1.16 05/29/2019   BUN 16 05/29/2019   CO2 25 05/29/2019   TSH 3.32 04/24/2019   PSA 1.77 04/19/2018   INR 1.16 05/13/2015   HGBA1C 5.7 07/31/2015    Dg Elbow Complete Left  Result Date: 06/05/2019 CLINICAL  DATA:  Posterior elbow laceration and bruising since falling 1 week ago. EXAM: LEFT ELBOW - COMPLETE 3+ VIEW COMPARISON:  Left elbow radiographs 05/29/2019. FINDINGS: No significant residual of a joint effusion is identified. There is mild soft tissue swelling over the olecranon process. There is no foreign body. No evidence of acute fracture or dislocation. There is mild spurring of the coronoid process. IMPRESSION: No evidence of acute fracture or dislocation. No significant residual elbow joint effusion identified. Electronically Signed   By: Richardean Sale M.D.   On: 06/05/2019 12:59   Dg Forearm Left  Result Date: 06/05/2019 CLINICAL DATA:  78 year old male with a history of fall EXAM: LEFT FOREARM - 2 VIEW COMPARISON:  None. FINDINGS: No acute displaced fracture. No focal soft tissue swelling. No radiopaque foreign body. IMPRESSION: Negative for acute bony abnormality Electronically Signed   By: Corrie Mckusick D.O.   On: 06/05/2019 12:57    Dg Chest 2 View  Result Date: 06/13/2019 CLINICAL DATA:  Lateral rib pain status post fall. The pain is primarily in the left lower chest wall. EXAM: CHEST - 2 VIEW COMPARISON:  Chest x-ray dated April 17, 2018. FINDINGS: The heart size is stable. There is pleuroparenchymal scarring at the lung apices. There are few scattered airspace opacities for favored to represent the patient's known lower lobe pulmonary AVMs. There is no pneumothorax. There is no displaced fracture. IMPRESSION: 1. No acute displaced fracture. 2. No acute cardiopulmonary process. 3. Again noted is a right lower lobe opacity which is favored to represent the patient's known AVM. Electronically Signed   By: Constance Holster M.D.   On: 06/13/2019  01:53    Assessment & Plan:   Gregory [name] was seen today for chest pain.  Diagnoses and all orders for this visit:  Contusion of rib on left side, sequela- His exam is unremarkable and the plain films are negative for fracture, contusion, or pneumothorax.  I have offered oxycodone for symptom relief. -     oxyCODONE (OXY IR/ROXICODONE) 5 MG immediate release tablet; Take 1 tablet (5 mg total) by mouth every 4 (four) hours as needed for severe pain. -     DG Chest 2 View; Future  Blunt trauma to chest, subsequent encounter- See above -     DG Chest 2 View; Future   I have discontinued Gregory Chambers "[name]"'s clorazepate, tobramycin, and doxycycline. I am also having him start on oxyCODONE. Additionally, I am having him maintain his Magnesium, Vitamin D3, FERROUS SULFATE PO, vitamin C, levocetirizine, albuterol, metoprolol succinate, amoxicillin, meclizine, metaxalone, montelukast, pantoprazole, silodosin, and levothyroxine.  Meds ordered this encounter  Medications   oxyCODONE (OXY IR/ROXICODONE) 5 MG immediate release tablet    Sig: Take 1 tablet (5 mg total) by mouth every 4 (four) hours as needed for severe pain.    Dispense:  30 tablet    Refill:  0     Follow-up: Return if symptoms worsen or fail to improve.  Scarlette Calico, MD

## 2019-06-13 ENCOUNTER — Encounter: Payer: Self-pay | Admitting: Internal Medicine

## 2019-08-24 ENCOUNTER — Ambulatory Visit (INDEPENDENT_AMBULATORY_CARE_PROVIDER_SITE_OTHER): Payer: Medicare Other

## 2019-08-24 ENCOUNTER — Other Ambulatory Visit: Payer: Self-pay

## 2019-08-24 DIAGNOSIS — Z23 Encounter for immunization: Secondary | ICD-10-CM | POA: Diagnosis not present

## 2019-09-18 ENCOUNTER — Telehealth: Payer: Self-pay | Admitting: Internal Medicine

## 2019-09-18 NOTE — Telephone Encounter (Signed)
Medication Refill - Medication: Magic Mouthwash   Has the patient contacted their pharmacy? Yes.   (Agent: If no, request that the patient contact the pharmacy for the refill.) (Agent: If yes, when and what did the pharmacy advise?)  Preferred Pharmacy (with phone number or street name):  [id number] - [place], [place] [address]  [place] [place] [address]  Phone: [phone] Fax: [phone]     Agent: Please be advised that RX refills may take up to 3 business days. We ask that you follow-up with your pharmacy.

## 2019-09-18 NOTE — Telephone Encounter (Signed)
Patient requesting Magic Mouthwash. Please advise

## 2019-09-20 NOTE — Telephone Encounter (Signed)
LVM for pt to call back as soon as possible.   

## 2019-09-21 NOTE — Telephone Encounter (Signed)
I do not think Magic mouthwash will fix this.  He needs to figure out why he cannot breathe through his nose.  TJ

## 2019-09-21 NOTE — Telephone Encounter (Signed)
Spoke to pt, he uses the magic mouth due to throat getting dry due to not being able to breath through his nose.

## 2019-09-24 ENCOUNTER — Encounter: Payer: Self-pay | Admitting: Internal Medicine

## 2019-09-24 NOTE — Telephone Encounter (Signed)
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

## 2019-09-24 NOTE — Telephone Encounter (Signed)
Pt informed of same and stated understanding.  

## 2019-09-24 NOTE — Telephone Encounter (Signed)
He needs to ask his ENT surgeon what to do to keep his nostrils open  Gregory [name]

## 2019-09-28 ENCOUNTER — Encounter: Payer: Self-pay | Admitting: Neurology

## 2019-10-09 DIAGNOSIS — R053 Chronic cough: Secondary | ICD-10-CM

## 2019-10-09 HISTORY — DX: Chronic cough: R05.3

## 2019-10-24 ENCOUNTER — Encounter: Payer: Self-pay | Admitting: Internal Medicine

## 2019-10-24 ENCOUNTER — Other Ambulatory Visit: Payer: Self-pay

## 2019-10-24 ENCOUNTER — Ambulatory Visit (INDEPENDENT_AMBULATORY_CARE_PROVIDER_SITE_OTHER): Payer: Medicare Other | Admitting: Internal Medicine

## 2019-10-24 ENCOUNTER — Other Ambulatory Visit (INDEPENDENT_AMBULATORY_CARE_PROVIDER_SITE_OTHER): Payer: Medicare Other

## 2019-10-24 VITALS — BP 136/68 | HR 62 | Temp 97.6°F | Resp 16 | Ht 70.0 in | Wt 182.5 lb

## 2019-10-24 DIAGNOSIS — E871 Hypo-osmolality and hyponatremia: Secondary | ICD-10-CM

## 2019-10-24 DIAGNOSIS — R053 Chronic cough: Secondary | ICD-10-CM

## 2019-10-24 DIAGNOSIS — R05 Cough: Secondary | ICD-10-CM

## 2019-10-24 DIAGNOSIS — E039 Hypothyroidism, unspecified: Secondary | ICD-10-CM | POA: Diagnosis not present

## 2019-10-24 HISTORY — DX: Hypo-osmolality and hyponatremia: E87.1

## 2019-10-24 LAB — BASIC METABOLIC PANEL
BUN: 15 mg/dL (ref 6–23)
CO2: 30 mEq/L (ref 19–32)
Calcium: 9.4 mg/dL (ref 8.4–10.5)
Chloride: 96 mEq/L (ref 96–112)
Creatinine, Ser: 1.06 mg/dL (ref 0.40–1.50)
GFR: 67.74 mL/min (ref >60.00)
Glucose, Bld: 97 mg/dL (ref 70–99)
Potassium: 4.5 mEq/L (ref 3.5–5.1)
Sodium: 132 mEq/L — ABNORMAL LOW (ref 135–145)

## 2019-10-24 LAB — TSH: TSH: 3.23 u[IU]/mL (ref 0.35–4.50)

## 2019-10-24 NOTE — Patient Instructions (Signed)
Hypothyroidism  Hypothyroidism is when the thyroid gland does not make enough of certain hormones (it is underactive). The thyroid gland is a small gland located in the lower front part of the neck, just in front of the windpipe (trachea). This gland makes hormones that help control how the body uses food for energy (metabolism) as well as how the heart and brain function. These hormones also play a role in keeping your bones strong. When the thyroid is underactive, it produces too little of the hormones thyroxine (T4) and triiodothyronine (T3). What are the causes? This condition may be caused by:  Hashimoto's disease. This is a disease in which the body's disease-fighting system (immune system) attacks the thyroid gland. This is the most common cause.  Viral infections.  Pregnancy.  Certain medicines.  Birth defects.  Past radiation treatments to the head or neck for cancer.  Past treatment with radioactive iodine.  Past exposure to radiation in the environment.  Past surgical removal of part or all of the thyroid.  Problems with a gland in the center of the brain (pituitary gland).  Lack of enough iodine in the diet. What increases the risk? You are more likely to develop this condition if:  You are male.  You have a family history of thyroid conditions.  You use a medicine called lithium.  You take medicines that affect the immune system (immunosuppressants). What are the signs or symptoms? Symptoms of this condition include:  Feeling as though you have no energy (lethargy).  Not being able to tolerate cold.  Weight gain that is not explained by a change in diet or exercise habits.  Lack of appetite.  Dry skin.  Coarse hair.  Menstrual irregularity.  Slowing of thought processes.  Constipation.  Sadness or depression. How is this diagnosed? This condition may be diagnosed based on:  Your symptoms, your medical history, and a physical exam.  Blood  tests. You may also have imaging tests, such as an ultrasound or MRI. How is this treated? This condition is treated with medicine that replaces the thyroid hormones that your body does not make. After you begin treatment, it may take several weeks for symptoms to go away. Follow these instructions at home:  Take over-the-counter and prescription medicines only as told by your health care provider.  If you start taking any new medicines, tell your health care provider.  Keep all follow-up visits as told by your health care provider. This is important. ? As your condition improves, your dosage of thyroid hormone medicine may change. ? You will need to have blood tests regularly so that your health care provider can monitor your condition. Contact a health care provider if:  Your symptoms do not get better with treatment.  You are taking thyroid replacement medicine and you: ? Sweat a lot. ? Have tremors. ? Feel anxious. ? Lose weight rapidly. ? Cannot tolerate heat. ? Have emotional swings. ? Have diarrhea. ? Feel weak. Get help right away if you have:  Chest pain.  An irregular heartbeat.  A rapid heartbeat.  Difficulty breathing. Summary  Hypothyroidism is when the thyroid gland does not make enough of certain hormones (it is underactive).  When the thyroid is underactive, it produces too little of the hormones thyroxine (T4) and triiodothyronine (T3).  The most common cause is Hashimoto's disease, a disease in which the body's disease-fighting system (immune system) attacks the thyroid gland. The condition can also be caused by viral infections, medicine, pregnancy, or past   radiation treatment to the head or neck.  Symptoms may include weight gain, dry skin, constipation, feeling as though you do not have energy, and not being able to tolerate cold.  This condition is treated with medicine to replace the thyroid hormones that your body does not make. This information  is not intended to replace advice given to you by your health care provider. Make sure you discuss any questions you have with your health care provider. Document Released: 12/06/2005 Document Revised: 11/18/2017 Document Reviewed: 11/16/2017 Elsevier Patient Education  2020 Elsevier Inc.  

## 2019-10-24 NOTE — Progress Notes (Signed)
Subjective:  Patient ID: Gregory [name], male    DOB: [birth year]  Age: 78 y.o. MRN: [id number]  CC: Hypertension, Hypothyroidism, and Cough   HPI CHAPEL ZELLAR presents for f/up - He continues to complain of chronic nonproductive cough.  He tells me he saw an ENT doctor about 3 or 4 weeks ago and was placed on gabapentin which has not helped.  He wants to be tested for COVID-19 but he is not willing to do a nasal swab.  Outpatient Medications Prior to Visit  Medication Sig Dispense Refill   albuterol (PROAIR HFA) 108 (90 Base) MCG/ACT inhaler Inhale 1-2 puffs into the lungs every 6 (six) hours as needed for wheezing or shortness of breath.     Cholecalciferol (VITAMIN D3) 2000 UNITS TABS Take 2,000 Units by mouth daily.     FERROUS SULFATE PO Take 65 mg by mouth 3 (three) times a week. Mon, wed, Fri     gabapentin (NEURONTIN) 100 MG capsule Take 100 mg by mouth 3 (three) times daily.     levocetirizine (XYZAL) 5 MG tablet Take 1 tablet (5 mg total) by mouth every evening. 90 tablet 1   levothyroxine (SYNTHROID) 125 MCG tablet Take daily 90 tablet 1   magic mouthwash SOLN      Magnesium 250 MG TABS Take 250 mg by mouth at bedtime.      meclizine (ANTIVERT) 25 MG tablet Take 1 tablet (25 mg total) by mouth daily as needed for dizziness. 30 tablet 5   metaxalone (SKELAXIN) 800 MG tablet Take 1 tablet (800 mg total) by mouth 3 (three) times daily. 180 tablet 1   metoprolol succinate (TOPROL-XL) 25 MG 24 hr tablet TAKE 0.5  TABLET BY MOUTH  DAILY 90 tablet 2   montelukast (SINGULAIR) 10 MG tablet TAKE 1 TABLET BY MOUTH EVERYDAY AT BEDTIME 90 tablet 1   pantoprazole (PROTONIX) 40 MG tablet Take 1 tablet (40 mg total) by mouth 2 (two) times daily. 180 tablet 1   silodosin (RAPAFLO) 8 MG CAPS capsule Take 1 capsule (8 mg total) by mouth at bedtime. 90 capsule 1   vitamin C (ASCORBIC ACID) 500 MG tablet Take 500 mg 3 (three) times a week by mouth.      amoxicillin (AMOXIL)  500 MG capsule Take 2 capsules (1,000 mg total) by mouth as needed. 2 capsule 3   oxyCODONE (OXY IR/ROXICODONE) 5 MG immediate release tablet Take 1 tablet (5 mg total) by mouth every 4 (four) hours as needed for severe pain. 30 tablet 0   No facility-administered medications prior to visit.     ROS Review of Systems  Constitutional: Negative for chills, diaphoresis, fatigue and fever.  HENT: Negative.   Respiratory: Positive for cough. Negative for chest tightness, shortness of breath and wheezing.   Cardiovascular: Negative for chest pain, palpitations and leg swelling.  Gastrointestinal: Negative for abdominal pain, blood in stool, constipation, nausea and vomiting.  Endocrine: Negative.  Negative for cold intolerance and heat intolerance.  Genitourinary: Negative.  Negative for difficulty urinating.  Musculoskeletal: Negative.  Negative for arthralgias, back pain, myalgias and neck pain.  Skin: Negative.  Negative for color change and pallor.  Neurological: Negative.  Negative for dizziness, weakness and light-headedness.  Hematological: Negative for adenopathy. Does not bruise/bleed easily.  Psychiatric/Behavioral: Negative.     Objective:  BP 136/68 (BP Location: Left Arm, Patient Position: Sitting, Cuff Size: Normal)    Pulse 62    Temp 97.6 F (36.4 C) (Oral)  Resp 16    Ht 5\' 10"  (1.778 m)    Wt 182 lb 8 oz (82.8 kg)    SpO2 92%    BMI 26.19 kg/m   BP Readings from Last 3 Encounters:  10/24/19 136/68  06/12/19 (!) 144/68  06/05/19 130/70    Wt Readings from Last 3 Encounters:  10/24/19 182 lb 8 oz (82.8 kg)  06/12/19 184 lb 12 oz (83.8 kg)  06/05/19 185 lb 12.8 oz (84.3 kg)    Physical Exam Vitals signs reviewed.  Constitutional:      Appearance: Normal appearance.  HENT:     Nose: Nose normal.     Mouth/Throat:     Mouth: Mucous membranes are moist.  Eyes:     General: No scleral icterus.    Conjunctiva/sclera: Conjunctivae normal.  Neck:      Musculoskeletal: Neck supple.  Cardiovascular:     Rate and Rhythm: Normal rate and regular rhythm.     Heart sounds: No murmur.  Pulmonary:     Effort: Pulmonary effort is normal.     Breath sounds: No stridor. No wheezing, rhonchi or rales.  Abdominal:     General: Abdomen is flat. Bowel sounds are normal. There is no distension.     Palpations: Abdomen is soft. There is no hepatomegaly or splenomegaly.  Musculoskeletal: Normal range of motion.     Right lower leg: No edema.     Left lower leg: No edema.  Lymphadenopathy:     Cervical: No cervical adenopathy.  Skin:    General: Skin is warm and dry.  Neurological:     General: No focal deficit present.     Mental Status: He is alert.  Psychiatric:        Mood and Affect: Mood normal.     Lab Results  Component Value Date   WBC 8.2 05/29/2019   HGB 15.5 05/29/2019   HCT 44.7 05/29/2019   PLT 256 05/29/2019   GLUCOSE 97 10/24/2019   CHOL 145 04/24/2019   TRIG 80.0 04/24/2019   HDL 40.00 04/24/2019   LDLDIRECT 139.4 06/15/2007   LDLCALC 89 04/24/2019   ALT 12 04/24/2019   AST 15 04/24/2019   NA 132 (L) 10/24/2019   K 4.5 10/24/2019   CL 96 10/24/2019   CREATININE 1.06 10/24/2019   BUN 15 10/24/2019   CO2 30 10/24/2019   TSH 3.23 10/24/2019   PSA 1.77 04/19/2018   INR 1.16 05/13/2015   HGBA1C 5.7 07/31/2015    Dg Chest 2 View  Result Date: 06/13/2019 CLINICAL DATA:  Lateral rib pain status post fall. The pain is primarily in the left lower chest wall. EXAM: CHEST - 2 VIEW COMPARISON:  Chest x-ray dated April 17, 2018. FINDINGS: The heart size is stable. There is pleuroparenchymal scarring at the lung apices. There are few scattered airspace opacities for favored to represent the patient's known lower lobe pulmonary AVMs. There is no pneumothorax. There is no displaced fracture. IMPRESSION: 1. No acute displaced fracture. 2. No acute cardiopulmonary process. 3. Again noted is a right lower lobe opacity which is  favored to represent the patient's known AVM. Electronically Signed   By: Constance Holster M.D.   On: 06/13/2019 01:53    Assessment & Plan:   Treylon was seen today for hypertension, hypothyroidism and cough.  Diagnoses and all orders for this visit:  Acquired hypothyroidism- His TSH is in the normal range.  He will remain on the current dose of levothyroxine. -  TSH; Future  Chronic hyponatremia- He is asymptomatic with respect to this.  His sodium level is stable at 132.  His osmolality is slightly low at 277 and his urine chloride and sodium are normal.  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  Since he is asymptomatic with this I do not think it should be treated with a medication like demeclocycline. -     Sodium, urine, random; Future -     Chloride, urine, random; Future -     Aldosterone + renin activity w/ ratio; Future -     Basic metabolic panel; Future -     Osmolality; Future  Cough, persistent- His Covid antibody test is negative.  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  I anticipate he will eventually respond to gabapentin. -     SAR CoV2 Serology (COVID 19)AB(IGG)IA; Future   I have discontinued [name] "[name]"'s amoxicillin and oxyCODONE. I am also having him maintain his Magnesium, Vitamin D3, FERROUS SULFATE PO, vitamin C, levocetirizine, albuterol, metoprolol succinate, meclizine, metaxalone, montelukast, pantoprazole, silodosin, levothyroxine, gabapentin, and magic mouthwash.  No orders of the defined types were placed in this encounter.    Follow-up: Return in about 6 months (around 04/22/2020).  Scarlette Calico, MD

## 2019-10-26 LAB — CHLORIDE, URINE, RANDOM: Chloride Urine: 82 mmol/L (ref 32–290)

## 2019-10-26 LAB — SODIUM, URINE, RANDOM: Sodium, Ur: 81 mmol/L (ref 28–272)

## 2019-10-26 LAB — OSMOLALITY: Osmolality: 277 mOsm/kg — ABNORMAL LOW (ref 278–305)

## 2019-10-28 ENCOUNTER — Encounter: Payer: Self-pay | Admitting: Internal Medicine

## 2019-10-30 ENCOUNTER — Other Ambulatory Visit: Payer: Self-pay | Admitting: Internal Medicine

## 2019-10-30 DIAGNOSIS — J4530 Mild persistent asthma, uncomplicated: Secondary | ICD-10-CM

## 2019-10-31 LAB — SAR COV2 SEROLOGY (COVID19)AB(IGG),IA: SARS CoV2 AB IGG: NEGATIVE

## 2019-10-31 LAB — ALDOSTERONE + RENIN ACTIVITY W/ RATIO
ALDO / PRA Ratio: 22.2 Ratio (ref 0.9–28.9)
Aldosterone: 6 ng/dL
Renin Activity: 0.27 ng/mL/h (ref 0.25–5.82)

## 2019-11-02 ENCOUNTER — Encounter: Payer: Self-pay | Admitting: Internal Medicine

## 2019-11-12 ENCOUNTER — Other Ambulatory Visit: Payer: Self-pay | Admitting: Internal Medicine

## 2019-11-12 ENCOUNTER — Other Ambulatory Visit: Payer: Self-pay | Admitting: Pulmonary Disease

## 2019-11-12 DIAGNOSIS — E039 Hypothyroidism, unspecified: Secondary | ICD-10-CM

## 2019-11-12 DIAGNOSIS — I483 Typical atrial flutter: Secondary | ICD-10-CM

## 2019-11-19 NOTE — Progress Notes (Signed)
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  Admitted 5/16 with headache and right lower extremity weakness. CT of the head demonstrated a brain mass measuring 3 x 2 cm with midline shift and edema. This was a ring enhancing mass in the left basal ganglia.He underwent stereotactic biopsy by neurosurgery. He was diagnosed with a brain abscess. Notes from ID indicate his cultures grew out Peptostreptococcus sp. Patient was followed by infectious disease in the hospital and placed on vancomycin, Rocephin and Flagyl. This was to be continued for one month post discharge. He was also placed on Keppra for seizure prophylaxis. 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   Following DC, patient had an episode of elevated heart rate. EMS was called and he was noted to be in atrial flutter. This converted to sinus rhythm spontaneously. Patient placed on toprol. Event monitor showed slow atrial flutter versus ectopic atrial tachycardia. Echocardiogram August 2016 showed normal LV systolic function, grade 2 diastolic dysfunction. Patient seen for dizziness August 2018 felt to be orthostatic. Toprol was decreased. Since he was last seen the patient denies any dyspnea on exertion, orthopnea, PND, pedal edema, palpitations, syncope or chest pain.   Current Outpatient Medications  Medication Sig Dispense Refill  . albuterol (PROAIR HFA) 108 (90 Base) MCG/ACT inhaler Inhale 1-2 puffs into the lungs every 6 (six) hours as needed for wheezing or shortness of breath.    . Cholecalciferol (VITAMIN D3) 2000 UNITS TABS Take 5,000 Units by mouth daily.     [name] FERROUS SULFATE PO Take 65 mg by mouth 3 (three) times a week. Mon, wed, Fri    . gabapentin (NEURONTIN) 100 MG capsule Take 100 mg by mouth 3 (three) times daily.    [name] levocetirizine (XYZAL) 5 MG  tablet Take 1 tablet (5 mg total) by mouth every evening. 90 tablet 1  . levothyroxine (SYNTHROID) 125 MCG tablet TAKE 1 TABLET BY MOUTH  DAILY 90 tablet 1  . magic mouthwash SOLN     . Magnesium 250 MG TABS Take 250 mg by mouth at bedtime.     . meclizine (ANTIVERT) 25 MG tablet Take 1 tablet (25 mg total) by mouth daily as needed for dizziness. 30 tablet 5  . metaxalone (SKELAXIN) 800 MG tablet Take 1 tablet (800 mg total) by mouth 3 (three) times daily. 180 tablet 1  . metoprolol succinate (TOPROL-XL) 25 MG 24 hr tablet TAKE 0.5  TABLET BY MOUTH  DAILY 90 tablet 2  . montelukast (SINGULAIR) 10 MG tablet TAKE 1 TABLET BY MOUTH EVERYDAY AT BEDTIME 90 tablet 1  . pantoprazole (PROTONIX) 40 MG tablet Take 1 tablet (40 mg total) by mouth 2 (two) times daily. 180 tablet 1  . silodosin (RAPAFLO) 8 MG CAPS capsule Take 1 capsule (8 mg total) by mouth at bedtime. 90 capsule 1  . vitamin C (ASCORBIC ACID) 500 MG tablet Take 500 mg 3 (three) times a week by mouth.      No current facility-administered medications for this visit.      Past Medical History:  Diagnosis Date  . Anxiety state, unspecified   . Arthritis   . Benign neoplasm of colon   . GERD (gastroesophageal reflux disease)   . 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 History of nonmelanoma skin cancer EXCISION SQUAMOUS CELL FROM HAND  .  Hypertrophy of prostate with urinary obstruction and other lower urinary tract symptoms (LUTS)   . Irritable bowel syndrome   . Lumbago   . Other diseases of nasal cavity and sinuses(478.19)   . Persistent disorder of initiating or maintaining sleep   . Plantar fascial fibromatosis   . Pure hypercholesterolemia   . 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 Unspecified hypothyroidism     Past Surgical History:  Procedure Laterality Date   . APPLICATION OF CRANIAL NAVIGATION N/A 05/16/2015   Procedure: APPLICATION OF CRANIAL NAVIGATION;  Surgeon: Consuella Lose, MD;  Location: [facility];  Service: Neurosurgery;  Laterality: N/A;  . BRAIN BIOPSY Left 05/16/2015   Procedure: Stereotactic Left Brain Biopsy with Brain Lab;  Surgeon: Consuella Lose, MD;  Location: [facility];  Service: Neurosurgery;  Laterality: Left;  Stereotactic Left Brain biopsy with brainlab  . BRAIN SURGERY    . CARDIOVASCULAR STRESS TEST  01-14-2003   NO ISCHEMIA / EF 61%/ NORMAL LE WALL MOTION  . EXCISION LEFT WRIST GANGLIAN/ MYXOID CYST  05-30-2009  . IR RADIOLOGIST EVAL & MGMT  02/01/2019  . 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 OTHER SURGICAL HISTORY     pulse laser for facial telangiectasias inthe past  . removal of skin cancer from forehead  10/2012   Dr. Renda Rolls  . TRANSTHORACIC ECHOCARDIOGRAM  10-17-2008   DR Kellee Sittner   NORMAL LVF/ EF 60%/  MILDLY DILATED RIGHT ATRIUM/ VENTRICULE  . VARICOCELECTOMY  1991   Dr. Tresa Endo    Social History   Socioeconomic History  . Marital status: Married    Spouse name: [name]  . Number of children: 2  . Years of education: Not on file  . Highest education level: Master's degree (e.g., MA, MS, MEng, MEd, MSW, MBA)  Occupational History    Comment: Consultant  Social Needs  . Financial resource strain: Not on file  . Food insecurity    Worry: Not on file    Inability: Not on file  . Transportation needs    Medical: Not on file    Non-medical: Not on file  Tobacco Use  . Smoking status: Never Smoker  . Smokeless tobacco: Never Used  Substance and Sexual Activity  . Alcohol use: No    Alcohol/week: 0.0 standard drinks  . Drug use: No  . Sexual activity: Not on file  Lifestyle  . Physical activity    Days per week: Not on file    Minutes per session: Not on file  . Stress: Not on file  Relationships  . Social Herbalist on  phone: Not on file    Gets together: Not on file    Attends religious service: Not on file    Active member of club or organization: Not on file    Attends meetings of clubs or organizations: Not on file    Relationship status: Not on file  . Intimate partner violence    Fear of current or ex partner: Not on file    Emotionally abused: Not on file    Physically abused: Not on file    Forced sexual activity: Not on file  Other Topics Concern  . Not on file  Social History Narrative   Lives with wife in a one story home.  Has 2 sons.  Retired Veterinary surgeon.  Education: Masters degree.      05/09/19- Pt states that his balance is worse since last visit. He has not fallen but has come close on a few occassions. The weakness on his right side is the same, he states.    Family History  Problem Relation Age of Onset  . Diabetes Mother   . Hypertension Mother   . Pancreatic cancer Mother   . Stroke Mother   . Kidney failure Father   . Hypertension Sister   . Healthy Son     ROS: no fevers or chills, productive cough, hemoptysis, dysphasia, odynophagia, melena, hematochezia, dysuria, hematuria, rash, seizure activity, orthopnea, PND, pedal edema, claudication. Remaining systems are negative.  Physical Exam: Well-developed well-nourished in no acute distress.  Skin is warm and dry.  HEENT is normal.  Neck is supple.  Chest is clear to auscultation with normal expansion.  Cardiovascular exam is regular rate and rhythm.  Abdominal exam nontender or distended. No masses palpated. Extremities show no edema. neuro grossly intact  ECG-sinus bradycardia at a rate of 59, no ST changes.  Personally reviewed  A/P  1 history of atrial flutter versus ectopic atrial tachycardia-patient has had no further symptoms and remains in sinus rhythm today.  Continue Toprol at present dose.  We have previously elected not to anticoagulate given history of Osler Weber Rendu with AV malformation/GI  bleeding and prior brain abscess.  2 hyperlipidemia-followed by primary care.  5 history of orthostatic symptoms-improved.  We will consider discontinuing his beta-blocker in the future if his symptoms worsen.  Kirk Ruths, MD

## 2019-11-26 ENCOUNTER — Ambulatory Visit (INDEPENDENT_AMBULATORY_CARE_PROVIDER_SITE_OTHER): Payer: Medicare Other | Admitting: Cardiology

## 2019-11-26 ENCOUNTER — Encounter: Payer: Self-pay | Admitting: Cardiology

## 2019-11-26 ENCOUNTER — Other Ambulatory Visit: Payer: Self-pay

## 2019-11-26 VITALS — BP 131/72 | HR 59 | Temp 97.3°F | Ht 70.0 in | Wt 185.2 lb

## 2019-11-26 DIAGNOSIS — E7800 Pure hypercholesterolemia, unspecified: Secondary | ICD-10-CM

## 2019-11-26 DIAGNOSIS — I483 Typical atrial flutter: Secondary | ICD-10-CM

## 2019-11-26 NOTE — Patient Instructions (Signed)
Medication Instructions:  NO CHANGE *If you need a refill on your cardiac medications before your next appointment, please call your pharmacy*  Lab Work: If you have labs (blood work) drawn today and your tests are completely normal, you will receive your results only by: [name] MyChart Message (if you have MyChart) OR . A paper copy in the mail If you have any lab test that is abnormal or we need to change your treatment, we will call you to review the results.  Follow-Up: At [facility], you and your health needs are our priority.  As part of our continuing mission to provide you with exceptional heart care, we have created designated Provider Care Teams.  These Care Teams include your primary Cardiologist (physician) and Advanced Practice Providers (APPs -  Physician Assistants and Nurse Practitioners) who all work together to provide you with the care you need, when you need it.  Your next appointment:   12 month(s)  The format for your next appointment:   Either In Person or Virtual  Provider:   You may see Kirk Ruths MD or one of the following Advanced Practice Providers on your designated Care Team:    Chanetta Marshall, NP  Tommye Standard, PA-C  Legrand Como "[place]" [place], [place]

## 2019-12-04 ENCOUNTER — Other Ambulatory Visit: Payer: Self-pay | Admitting: Internal Medicine

## 2019-12-04 DIAGNOSIS — N138 Other obstructive and reflux uropathy: Secondary | ICD-10-CM

## 2019-12-13 ENCOUNTER — Other Ambulatory Visit: Payer: Self-pay | Admitting: Internal Medicine

## 2019-12-13 DIAGNOSIS — K219 Gastro-esophageal reflux disease without esophagitis: Secondary | ICD-10-CM

## 2019-12-24 ENCOUNTER — Other Ambulatory Visit: Payer: Self-pay | Admitting: Pulmonary Disease

## 2019-12-24 DIAGNOSIS — I483 Typical atrial flutter: Secondary | ICD-10-CM

## 2020-01-03 ENCOUNTER — Ambulatory Visit: Payer: Medicare Other | Attending: Internal Medicine

## 2020-01-03 DIAGNOSIS — Z23 Encounter for immunization: Secondary | ICD-10-CM | POA: Insufficient documentation

## 2020-01-03 NOTE — Progress Notes (Signed)
   Covid-19 Vaccination Clinic  Name:  Gregory [name]    MRN: [id number] DOB: [birth year]  01/03/2020  [name] was observed post Covid-19 immunization for 15 minutes without incidence. He was provided with Vaccine Information Sheet and instruction to access the V-Safe system.   [name] was instructed to call 911 with any severe reactions post vaccine: [name] Difficulty breathing  . Swelling of your face and throat  . A fast heartbeat  . A bad rash all over your body  . Dizziness and weakness    Immunizations Administered    Name Date Dose VIS Date Route   Pfizer COVID-19 Vaccine 01/03/2020  9:17 AM 0.3 mL 11/30/2019 Intramuscular   Manufacturer: [place]   Lot: [id number]   [place]: [id number]

## 2020-01-22 ENCOUNTER — Other Ambulatory Visit: Payer: Self-pay | Admitting: Internal Medicine

## 2020-01-22 DIAGNOSIS — I483 Typical atrial flutter: Secondary | ICD-10-CM

## 2020-01-22 MED ORDER — METOPROLOL SUCCINATE ER 25 MG PO TB24: ORAL_TABLET | ORAL | 1 refills | Status: DC

## 2020-01-23 ENCOUNTER — Ambulatory Visit: Payer: Medicare Other | Attending: Internal Medicine

## 2020-01-23 DIAGNOSIS — Z23 Encounter for immunization: Secondary | ICD-10-CM

## 2020-01-23 NOTE — Progress Notes (Signed)
   Covid-19 Vaccination Clinic  Name:  Gregory [name]    MRN: [id number] DOB: [birth year]  01/23/2020  [name] was observed post Covid-19 immunization for 15 minutes without incidence. He was provided with Vaccine Information Sheet and instruction to access the V-Safe system.   [name] was instructed to call 911 with any severe reactions post vaccine: [name] Difficulty breathing  . Swelling of your face and throat  . A fast heartbeat  . A bad rash all over your body  . Dizziness and weakness    Immunizations Administered    Name Date Dose VIS Date Route   Pfizer COVID-19 Vaccine 01/23/2020  9:31 AM 0.3 mL 11/30/2019 Intramuscular   Manufacturer: [place]   Lot: [id number]   [place]: [id number]

## 2020-02-26 ENCOUNTER — Other Ambulatory Visit: Payer: Self-pay | Admitting: Internal Medicine

## 2020-02-26 DIAGNOSIS — Z298 Encounter for other specified prophylactic measures: Secondary | ICD-10-CM

## 2020-03-03 ENCOUNTER — Other Ambulatory Visit: Payer: Self-pay | Admitting: Internal Medicine

## 2020-03-03 DIAGNOSIS — N138 Other obstructive and reflux uropathy: Secondary | ICD-10-CM

## 2020-03-12 ENCOUNTER — Encounter: Payer: Self-pay | Admitting: Neurology

## 2020-03-13 ENCOUNTER — Telehealth (INDEPENDENT_AMBULATORY_CARE_PROVIDER_SITE_OTHER): Payer: Medicare Other | Admitting: Neurology

## 2020-03-13 ENCOUNTER — Other Ambulatory Visit: Payer: Self-pay

## 2020-03-13 VITALS — Ht 70.0 in | Wt 175.0 lb

## 2020-03-13 DIAGNOSIS — G620 Drug-induced polyneuropathy: Secondary | ICD-10-CM

## 2020-03-13 DIAGNOSIS — R252 Cramp and spasm: Secondary | ICD-10-CM

## 2020-03-13 DIAGNOSIS — R2681 Unsteadiness on feet: Secondary | ICD-10-CM | POA: Diagnosis not present

## 2020-03-13 NOTE — Progress Notes (Signed)
Virtual Visit via Video Note The purpose of this virtual visit is to provide medical care while limiting exposure to the novel coronavirus.    Consent was obtained for video visit:  Yes.   Answered questions that patient had about telehealth interaction:  Yes.   I discussed the limitations, risks, security and privacy concerns of performing an evaluation and management service by telemedicine. I also discussed with the patient that there may be a patient responsible charge related to this service. The patient expressed understanding and agreed to proceed.  Pt location: Home Physician Location: office Name of referring provider:  Janith Lima, MD I connected with Gregory [name] at patients initiation/request on 03/13/2020 at  8:30 AM EDT by video enabled telemedicine application and verified that I am speaking with the correct person using two identifiers. Pt MRN:  [id number] Pt DOB:  [birth year] Video Participants:  Gregory [name]   History of Present Illness: This is a 79 y.o. male returning for follow-up of gait unsteadiness, neuropathy, and new complaints of slurred speech.  He has reported ongoing issues with unsteadiness and completed PT last year which helped.  He walks unassisted and suffered one fall in June 2020. CT head was reviewed and is stable, no new findings. 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  He feels that his balance is worse when he walks backwards and turns.  He does home exercises for stretching and balance which helps.    He was started on gabapentin 100mg  twice daily by ENT for chronic cough, which has helped his cough and he also noticed improved numbness/tingling.  He no longer has numbness in his toes.  His wife has mentioned he is more irritable and he is wondering if this is a medication side effect.  He also feels that his speech is slurred.  He is highly independent with all ADLs and IADLs.     Observations/Objective:    Vitals:   03/12/20 1619  Weight: 175 lb (79.4 kg)  Height: 5\' 10"  (1.778 m)   Patient is awake, alert, and appears comfortable.  Oriented x 4.   Extraocular muscles are intact. No ptosis.  Mild flattening of the right nasolabial fold (old). Speech is not dysarthric. Lingual and guttural sounds are normal. Tongue is midline. Antigravity in all extremities.  No pronator drift. Gait appears normal.   Assessment and Plan:  1.  Gait unsteadiness, mild and chronic.  He walks unassisted and notices problems only with turning and walking backwards.  I suggested PT, but he is active doing home exercises which he will continue.   2.  Speech changes, I do not appreciate slurred speech and reassured him that there are no new findings on his CT head from 2020.  Continue to monitor  3.  Drug induced neuropathy, stable and mild. He notices improved symptoms with gabapentin 100mg  BID which he takes for chronic cough.  Patient informed that irritability is not a common side effect and he is on a very low dose. He can stop the medication and see if there is improvement in mood, no tapering necessarily, since he is on a low dose, but would recommend he discuss with ENT who started the medication.   4.  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     Follow Up Instructions:   I discussed the assessment and treatment plan with the patient. The patient was  provided an opportunity to ask questions and all were answered. The patient agreed with the plan and demonstrated an understanding of the instructions.   The patient was advised to call back or seek an in-person evaluation if the symptoms worsen or if the condition fails to improve as anticipated.  Follow-up in 1 year    Alda Berthold, DO

## 2020-03-19 ENCOUNTER — Other Ambulatory Visit: Payer: Self-pay | Admitting: Internal Medicine

## 2020-03-19 DIAGNOSIS — E039 Hypothyroidism, unspecified: Secondary | ICD-10-CM

## 2020-04-02 ENCOUNTER — Other Ambulatory Visit: Payer: Self-pay

## 2020-04-02 ENCOUNTER — Encounter: Payer: Self-pay | Admitting: Internal Medicine

## 2020-04-02 ENCOUNTER — Ambulatory Visit (INDEPENDENT_AMBULATORY_CARE_PROVIDER_SITE_OTHER): Payer: Medicare Other | Admitting: Internal Medicine

## 2020-04-02 VITALS — BP 120/70 | HR 57 | Temp 98.4°F | Ht 70.0 in | Wt 184.0 lb

## 2020-04-02 DIAGNOSIS — L600 Ingrowing nail: Secondary | ICD-10-CM | POA: Diagnosis not present

## 2020-04-02 NOTE — Patient Instructions (Signed)
Ingrown Toenail An ingrown toenail occurs when the corner or sides of a toenail grow into the surrounding skin. This causes discomfort and pain. The big toe is most commonly affected, but any of the toes can be affected. If an ingrown toenail is not treated, it can become infected. What are the causes? This condition may be caused by:  Wearing shoes that are too small or tight.  An injury, such as stubbing your toe or having your toe stepped on.  Improper cutting or care of your toenails.  Having nail or foot abnormalities that were present from birth (congenital abnormalities), such as having a nail that is too big for your toe. What increases the risk? The following factors may make you more likely to develop ingrown toenails:  Age. Nails tend to get thicker with age, so ingrown nails are more common among older people.  Cutting your toenails incorrectly, such as cutting them very short or cutting them unevenly. An ingrown toenail is more likely to get infected if you have:  Diabetes.  Blood flow (circulation) problems. What are the signs or symptoms? Symptoms of an ingrown toenail may include:  Pain, soreness, or tenderness.  Redness.  Swelling.  Hardening of the skin that surrounds the toenail. Signs that an ingrown toenail may be infected include:  Fluid or pus.  Symptoms that get worse instead of better. How is this diagnosed? An ingrown toenail may be diagnosed based on your medical history, your symptoms, and a physical exam. If you have fluid or blood coming from your toenail, a sample may be collected to test for the specific type of bacteria that is causing the infection. How is this treated? Treatment depends on how severe your ingrown toenail is. You may be able to care for your toenail at home.  If you have an infection, you may be prescribed antibiotic medicines.  If you have fluid or pus draining from your toenail, your health care provider may drain  it.  If you have trouble walking, you may be given crutches to use.  If you have a severe or infected ingrown toenail, you may need a procedure to remove part or all of the nail. Follow these instructions at home: Foot care   Do not pick at your toenail or try to remove it yourself.  Soak your foot in warm, soapy water. Do this for 20 minutes, 3 times a day, or as often as told by your health care provider. This helps to keep your toe clean and keep your skin soft.  Wear shoes that fit well and are not too tight. Your health care provider may recommend that you wear open-toed shoes while you heal.  Trim your toenails regularly and carefully. Cut your toenails straight across to prevent injury to the skin at the corners of the toenail. Do not cut your nails in a curved shape.  Keep your feet clean and dry to help prevent infection. Medicines  Take over-the-counter and prescription medicines only as told by your health care provider.  If you were prescribed an antibiotic, take it as told by your health care provider. Do not stop taking the antibiotic even if you start to feel better. Activity  Return to your normal activities as told by your health care provider. Ask your health care provider what activities are safe for you.  Avoid activities that cause pain. General instructions  If your health care provider told you to use crutches to help you move around, use them   as instructed.  Keep all follow-up visits as told by your health care provider. This is important. Contact a health care provider if:  You have more redness, swelling, pain, or other symptoms that do not improve with treatment.  You have fluid, blood, or pus coming from your toenail. Get help right away if:  You have a red streak on your skin that starts at your foot and spreads up your leg.  You have a fever. Summary  An ingrown toenail occurs when the corner or sides of a toenail grow into the surrounding  skin. This causes discomfort and pain. The big toe is most commonly affected, but any of the toes can be affected.  If an ingrown toenail is not treated, it can become infected.  Fluid or pus draining from your toenail is a sign of infection. Your health care provider may need to drain it. You may be given antibiotics to treat the infection.  Trimming your toenails regularly and properly can help you prevent an ingrown toenail. This information is not intended to replace advice given to you by your health care provider. Make sure you discuss any questions you have with your health care provider. Document Revised: 03/30/2019 Document Reviewed: 08/24/2017 Elsevier Patient Education  2020 [employer]  

## 2020-04-02 NOTE — Progress Notes (Signed)
Subjective:  Patient ID: Gregory [name], male    DOB: 01-Aug-1941  Age: 79 y.o. MRN: [id number]  CC: Toe Pain  This visit occurred during the SARS-CoV-2 public health emergency.  Safety protocols were in place, including screening questions prior to the visit, additional usage of staff PPE, and extensive cleaning of exam room while observing appropriate contact time as indicated for disinfecting solutions.    HPI Gregory [name] presents for a 3 week history of discomfort in his right second toe adjacent to the lateral aspect of the nail.  He denies any trauma or injury.  The area is painful but there is no redness, swelling, warmth, or exudate.  Outpatient Medications Prior to Visit  Medication Sig Dispense Refill  . Cholecalciferol (VITAMIN D3) 2000 UNITS TABS Take 5,000 Units by mouth daily.     [name] FERROUS SULFATE PO Take 65 mg by mouth 3 (three) times a week. Mon, wed, Fri    . gabapentin (NEURONTIN) 100 MG capsule Take 100 mg by mouth 2 (two) times daily.     [name] levocetirizine (XYZAL) 5 MG tablet Take 1 tablet (5 mg total) by mouth every evening. 90 tablet 1  . magic mouthwash SOLN     . Magnesium 250 MG TABS Take 250 mg by mouth at bedtime.     . meclizine (ANTIVERT) 25 MG tablet Take 1 tablet (25 mg total) by mouth daily as needed for dizziness. 30 tablet 5  . metaxalone (SKELAXIN) 800 MG tablet Take 1 tablet (800 mg total) by mouth 3 (three) times daily. 180 tablet 1  . metoprolol succinate (TOPROL-XL) 25 MG 24 hr tablet TAKE 0.5  TABLET BY MOUTH  DAILY 90 tablet 1  . montelukast (SINGULAIR) 10 MG tablet TAKE 1 TABLET BY MOUTH EVERYDAY AT BEDTIME 90 tablet 1  . nystatin (MYCOSTATIN) 100000 UNIT/ML suspension nystatin 100,000 unit/mL oral suspension    . pantoprazole (PROTONIX) 40 MG tablet TAKE 1 TABLET BY MOUTH  TWICE DAILY 180 tablet 1  . silodosin (RAPAFLO) 8 MG CAPS capsule TAKE ONE CAPSULE BY MOUTH DAILY AT BEDTIME  90 capsule 1  . vitamin C (ASCORBIC ACID) 500 MG tablet  Take 500 mg 3 (three) times a week by mouth.     . levothyroxine (SYNTHROID) 125 MCG tablet TAKE 1 TABLET BY MOUTH  DAILY 90 tablet 1  . albuterol (PROAIR HFA) 108 (90 Base) MCG/ACT inhaler Inhale 1-2 puffs into the lungs every 6 (six) hours as needed for wheezing or shortness of breath.    [name] amoxicillin (AMOXIL) 500 MG capsule TAKE 2 CAPSULES (1,000 MG TOTAL) BY MOUTH AS NEEDED. 2 capsule 3   No facility-administered medications prior to visit.    ROS Review of Systems  All other systems reviewed and are negative.   Objective:  BP 120/70 (BP Location: Left Arm, Patient Position: Sitting, Cuff Size: Large)   Pulse (!) 57   Temp 98.4 F (36.9 C) (Oral)   Ht 5\' 10"  (1.778 m)   Wt 184 lb (83.5 kg)   SpO2 94%   BMI 26.40 kg/m   BP Readings from Last 3 Encounters:  04/02/20 120/70  11/26/19 131/72  10/24/19 136/68    Wt Readings from Last 3 Encounters:  04/02/20 184 lb (83.5 kg)  03/12/20 175 lb (79.4 kg)  11/26/19 185 lb 3.2 oz (84 kg)    Physical Exam Vitals reviewed.  Feet:     Comments: Right second toe shows that the nail is ingrown over the  lateral aspect.  There is mild erythema and tenderness to palpation but there is no warmth, exudate, or streaking.    Lab Results  Component Value Date   WBC 8.2 05/29/2019   HGB 15.5 05/29/2019   HCT 44.7 05/29/2019   PLT 256 05/29/2019   GLUCOSE 97 10/24/2019   CHOL 145 04/24/2019   TRIG 80.0 04/24/2019   HDL 40.00 04/24/2019   LDLDIRECT 139.4 06/15/2007   LDLCALC 89 04/24/2019   ALT 12 04/24/2019   AST 15 04/24/2019   NA 132 (L) 10/24/2019   K 4.5 10/24/2019   CL 96 10/24/2019   CREATININE 1.06 10/24/2019   BUN 15 10/24/2019   CO2 30 10/24/2019   TSH 3.23 10/24/2019   PSA 1.77 04/19/2018   INR 1.16 05/13/2015   HGBA1C 5.7 07/31/2015    DG Chest 2 View  Result Date: 06/13/2019 CLINICAL DATA:  Lateral rib pain status post fall. The pain is primarily in the left lower chest wall. EXAM: CHEST - 2 VIEW  COMPARISON:  Chest x-ray dated April 17, 2018. FINDINGS: The heart size is stable. There is pleuroparenchymal scarring at the lung apices. There are few scattered airspace opacities for favored to represent the patient's known lower lobe pulmonary AVMs. There is no pneumothorax. There is no displaced fracture. IMPRESSION: 1. No acute displaced fracture. 2. No acute cardiopulmonary process. 3. Again noted is a right lower lobe opacity which is favored to represent the patient's known AVM. Electronically Signed   By: Constance Holster M.D.   On: 06/13/2019 01:53   After informed verbal consent was obtained, using Betadine for cleansing and 2% Lidocaine without epinephrine for anesthetic - with sterile technique a hallux digital block was performed by injecting 1 cc of 2% plain lidocaine in each quadrant at the base of the second toe.  10 minutes later, after anesthesia was obtained a pair of nail clippers was used to remove the ingrown aspect of the toenail.  He tolerated the procedure well.  There was no bleeding or blood loss.  Afterwards there is good sensation and capillary refill in the toe.  Antibiotic dressing is applied. The procedure was well tolerated without complications.  Assessment & Plan:   [name] was seen today for toe pain.  Diagnoses and all orders for this visit:  Ingrown toenail of right foot- The ingrown aspect of the toenail was successfully removed without complication.   I have discontinued Gregory [name] "[name]"'s albuterol and amoxicillin. I am also having him maintain his Magnesium, Vitamin D3, FERROUS SULFATE PO, vitamin C, levocetirizine, meclizine, metaxalone, gabapentin, magic mouthwash, montelukast, pantoprazole, metoprolol succinate, silodosin, and nystatin.  No orders of the defined types were placed in this encounter.    Follow-up: Return in 1 week (on 04/09/2020), or if symptoms worsen or fail to improve.  Scarlette Calico, MD

## 2020-04-04 ENCOUNTER — Other Ambulatory Visit: Payer: Self-pay | Admitting: Internal Medicine

## 2020-04-04 DIAGNOSIS — E039 Hypothyroidism, unspecified: Secondary | ICD-10-CM

## 2020-04-06 ENCOUNTER — Encounter: Payer: Self-pay | Admitting: Internal Medicine

## 2020-04-22 ENCOUNTER — Encounter: Payer: Self-pay | Admitting: Internal Medicine

## 2020-04-22 ENCOUNTER — Ambulatory Visit (INDEPENDENT_AMBULATORY_CARE_PROVIDER_SITE_OTHER): Payer: Medicare Other | Admitting: Internal Medicine

## 2020-04-22 ENCOUNTER — Other Ambulatory Visit: Payer: Self-pay

## 2020-04-22 VITALS — BP 130/58 | HR 72 | Temp 97.3°F | Ht 70.0 in | Wt 184.4 lb

## 2020-04-22 DIAGNOSIS — N401 Enlarged prostate with lower urinary tract symptoms: Secondary | ICD-10-CM

## 2020-04-22 DIAGNOSIS — E871 Hypo-osmolality and hyponatremia: Secondary | ICD-10-CM

## 2020-04-22 DIAGNOSIS — D500 Iron deficiency anemia secondary to blood loss (chronic): Secondary | ICD-10-CM

## 2020-04-22 DIAGNOSIS — K580 Irritable bowel syndrome with diarrhea: Secondary | ICD-10-CM

## 2020-04-22 DIAGNOSIS — K219 Gastro-esophageal reflux disease without esophagitis: Secondary | ICD-10-CM

## 2020-04-22 DIAGNOSIS — R053 Chronic cough: Secondary | ICD-10-CM

## 2020-04-22 DIAGNOSIS — E222 Syndrome of inappropriate secretion of antidiuretic hormone: Secondary | ICD-10-CM

## 2020-04-22 DIAGNOSIS — E039 Hypothyroidism, unspecified: Secondary | ICD-10-CM | POA: Diagnosis not present

## 2020-04-22 DIAGNOSIS — R05 Cough: Secondary | ICD-10-CM

## 2020-04-22 DIAGNOSIS — J4530 Mild persistent asthma, uncomplicated: Secondary | ICD-10-CM

## 2020-04-22 DIAGNOSIS — N138 Other obstructive and reflux uropathy: Secondary | ICD-10-CM

## 2020-04-22 LAB — CBC WITH DIFFERENTIAL/PLATELET
Basophils Absolute: 0 10*3/uL (ref 0.0–0.1)
Basophils Relative: 0.4 % (ref 0.0–3.0)
Eosinophils Absolute: 0.1 10*3/uL (ref 0.0–0.7)
Eosinophils Relative: 0.9 % (ref 0.0–5.0)
HCT: 46 % (ref 39.0–52.0)
Hemoglobin: 15.9 g/dL (ref 13.0–17.0)
Lymphocytes Relative: 28.5 % (ref 12.0–46.0)
Lymphs Abs: 1.9 10*3/uL (ref 0.7–4.0)
MCHC: 34.6 g/dL (ref 30.0–36.0)
MCV: 92.2 fl (ref 78.0–100.0)
Monocytes Absolute: 0.7 10*3/uL (ref 0.1–1.0)
Monocytes Relative: 11.2 % (ref 3.0–12.0)
Neutro Abs: 3.9 10*3/uL (ref 1.4–7.7)
Neutrophils Relative %: 59 % (ref 43.0–77.0)
Platelets: 273 10*3/uL (ref 150.0–400.0)
RBC: 4.99 Mil/uL (ref 4.22–5.81)
RDW: 13.8 % (ref 11.5–15.5)
WBC: 6.6 10*3/uL (ref 4.0–10.5)

## 2020-04-22 LAB — BASIC METABOLIC PANEL
BUN: 15 mg/dL (ref 6–23)
CO2: 31 mEq/L (ref 19–32)
Calcium: 9.4 mg/dL (ref 8.4–10.5)
Chloride: 94 mEq/L — ABNORMAL LOW (ref 96–112)
Creatinine, Ser: 1.03 mg/dL (ref 0.40–1.50)
GFR: 69.93 mL/min (ref >60.00)
Glucose, Bld: 122 mg/dL — ABNORMAL HIGH (ref 70–99)
Potassium: 4.2 mEq/L (ref 3.5–5.1)
Sodium: 130 mEq/L — ABNORMAL LOW (ref 135–145)

## 2020-04-22 LAB — FERRITIN: Ferritin: 30.4 ng/mL (ref 22.0–322.0)

## 2020-04-22 LAB — IBC PANEL
Iron: 206 ug/dL — ABNORMAL HIGH (ref 42–165)
Saturation Ratios: 55.9 % — ABNORMAL HIGH (ref 20.0–50.0)
Transferrin: 263 mg/dL (ref 212.0–360.0)

## 2020-04-22 LAB — TSH: TSH: 2.21 u[IU]/mL (ref 0.35–4.50)

## 2020-04-22 MED ORDER — SILODOSIN 8 MG PO CAPS: ORAL_CAPSULE | ORAL | 1 refills | Status: DC

## 2020-04-22 MED ORDER — GABAPENTIN 100 MG PO CAPS: 200.0000 mg | ORAL_CAPSULE | Freq: Two times a day (BID) | ORAL | 1 refills | Status: DC

## 2020-04-22 MED ORDER — PANTOPRAZOLE SODIUM 40 MG PO TBEC: 40.0000 mg | DELAYED_RELEASE_TABLET | Freq: Two times a day (BID) | ORAL | 1 refills | Status: DC

## 2020-04-22 MED ORDER — MONTELUKAST SODIUM 10 MG PO TABS: 10.0000 mg | ORAL_TABLET | Freq: Every day | ORAL | 1 refills | Status: DC

## 2020-04-22 MED ORDER — CILIDINIUM-CHLORDIAZEPOXIDE 2.5-5 MG PO CAPS: 1.0000 | ORAL_CAPSULE | Freq: Three times a day (TID) | ORAL | 3 refills | Status: DC

## 2020-04-22 NOTE — Patient Instructions (Signed)

## 2020-04-22 NOTE — Progress Notes (Signed)
Subjective:  Patient ID: Gregory [name], male    DOB: [birth year]  Age: 79 y.o. MRN: [id number]  CC: Cough, Hypothyroidism, and Anemia  This visit occurred during the SARS-CoV-2 public health emergency.  Safety protocols were in place, including screening questions prior to the visit, additional usage of staff PPE, and extensive cleaning of exam room while observing appropriate contact time as indicated for disinfecting solutions.    HPI Gregory [name] presents for f/up - He continues to complain of dizziness and ataxia.  He says the symptoms have not worsened over the last 4 to 5 years.  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  He also complains of chronic nonproductive cough.  He says the cough has recently increased.  He saw ENT and it was recommended that he try gabapentin.  He has gotten some relief with 100 mg twice a day dose but he would like to know if he could increase the dose.  He tried inhalers previously for the cough and they did not help.  He also complains of nasal congestion and runny nose.  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  Outpatient Medications Prior to Visit  Medication Sig Dispense Refill  . Cholecalciferol (VITAMIN D3) 2000 UNITS TABS Take 5,000 Units by mouth daily.     [name] levocetirizine (XYZAL) 5 MG tablet Take 1 tablet (5 mg total) by mouth every evening. 90 tablet 1  . levothyroxine (SYNTHROID) 125 MCG tablet TAKE 1 TABLET BY MOUTH  DAILY 90 tablet 0  . Magnesium 250 MG TABS Take 250 mg by mouth at bedtime.     . metoprolol succinate (TOPROL-XL) 25 MG 24 hr tablet TAKE 0.5  TABLET BY MOUTH  DAILY 90 tablet 1  . nystatin (MYCOSTATIN) 100000 UNIT/ML suspension nystatin 100,000 unit/mL oral suspension    . vitamin C (ASCORBIC ACID) 500 MG tablet Take 500 mg 3 (three) times a week by mouth.     Lyndle Herrlich SULFATE PO Take  65 mg by mouth 3 (three) times a week. Mon, wed, Fri    . gabapentin (NEURONTIN) 100 MG capsule Take 100 mg by mouth 2 (two) times daily.    . magic mouthwash SOLN     . meclizine (ANTIVERT) 25 MG tablet Take 1 tablet (25 mg total) by mouth daily as needed for dizziness. 30 tablet 5  . metaxalone (SKELAXIN) 800 MG tablet Take 1 tablet (800 mg total) by mouth 3 (three) times daily. 180 tablet 1  . montelukast (SINGULAIR) 10 MG tablet TAKE 1 TABLET BY MOUTH EVERYDAY AT BEDTIME 90 tablet 1  . pantoprazole (PROTONIX) 40 MG tablet TAKE 1 TABLET BY MOUTH  TWICE DAILY 180 tablet 1  . silodosin (RAPAFLO) 8 MG CAPS capsule TAKE ONE CAPSULE BY MOUTH DAILY AT BEDTIME  90 capsule 1   No facility-administered medications prior to visit.    ROS Review of Systems  Constitutional: Negative for appetite change, chills, diaphoresis, fatigue and fever.  HENT: Negative.  Negative for trouble swallowing and voice change.   Eyes: Negative for visual disturbance.  Respiratory: Positive for cough. Negative for chest tightness, shortness of breath and wheezing.   Cardiovascular: Negative for chest pain, palpitations and leg swelling.  Gastrointestinal: Negative for abdominal pain, constipation, diarrhea, nausea and vomiting.       ++ heartburn  Endocrine: Negative.  Negative  for polyuria.  Genitourinary: Negative for difficulty urinating, dysuria and frequency.  Musculoskeletal: Positive for gait problem. Negative for arthralgias and myalgias.  Skin: Negative.  Negative for color change.  Neurological: Positive for dizziness. Negative for syncope, weakness, light-headedness and headaches.  Hematological: Negative for adenopathy. Does not bruise/bleed easily.  Psychiatric/Behavioral: Negative.     Objective:  BP (!) 130/58 (BP Location: Left Arm, Patient Position: Sitting, Cuff Size: Normal)   Pulse 72   Temp (!) 97.3 F (36.3 C) (Oral)   Ht 5\' 10"  (1.778 m)   Wt 184 lb 6 oz (83.6 kg)   SpO2 92%   BMI  26.46 kg/m   BP Readings from Last 3 Encounters:  04/22/20 (!) 130/58  04/02/20 120/70  11/26/19 131/72    Wt Readings from Last 3 Encounters:  04/22/20 184 lb 6 oz (83.6 kg)  04/02/20 184 lb (83.5 kg)  03/12/20 175 lb (79.4 kg)    Physical Exam Constitutional:      Appearance: Normal appearance.  HENT:     Nose: Nose normal.     Mouth/Throat:     Mouth: Mucous membranes are moist.  Eyes:     General: No scleral icterus.    Conjunctiva/sclera: Conjunctivae normal.  Cardiovascular:     Rate and Rhythm: Normal rate and regular rhythm.     Heart sounds: No murmur.  Pulmonary:     Effort: Pulmonary effort is normal.     Breath sounds: No stridor. No wheezing, rhonchi or rales.  Abdominal:     General: Abdomen is flat.     Palpations: There is no mass.     Tenderness: There is no abdominal tenderness. There is no guarding.  Musculoskeletal:        General: Normal range of motion.     Cervical back: Neck supple.     Right lower leg: No edema.     Left lower leg: No edema.  Lymphadenopathy:     Cervical: No cervical adenopathy.  Skin:    General: Skin is warm.     Comments: Positive for telangiectasias  Neurological:     General: No focal deficit present.     Mental Status: He is alert and oriented to person, place, and time. Mental status is at baseline.  Psychiatric:        Mood and Affect: Mood normal.        Behavior: Behavior normal.     Lab Results  Component Value Date   WBC 6.6 04/22/2020   HGB 15.9 04/22/2020   HCT 46.0 04/22/2020   PLT 273.0 04/22/2020   GLUCOSE 122 (H) 04/22/2020   CHOL 145 04/24/2019   TRIG 80.0 04/24/2019   HDL 40.00 04/24/2019   LDLDIRECT 139.4 06/15/2007   LDLCALC 89 04/24/2019   ALT 12 04/24/2019   AST 15 04/24/2019   NA 130 (L) 04/22/2020   K 4.2 04/22/2020   CL 94 (L) 04/22/2020   CREATININE 1.03 04/22/2020   BUN 15 04/22/2020   CO2 31 04/22/2020   TSH 2.21 04/22/2020   PSA 1.77 04/19/2018   INR 1.16 05/13/2015    HGBA1C 5.7 07/31/2015    DG Chest 2 View  Result Date: 06/13/2019 CLINICAL DATA:  Lateral rib pain status post fall. The pain is primarily in the left lower chest wall. EXAM: CHEST - 2 VIEW COMPARISON:  Chest x-ray dated April 17, 2018. FINDINGS: The heart size is stable. There is pleuroparenchymal scarring at the lung apices. There are few scattered airspace  opacities for favored to represent the patient's known lower lobe pulmonary AVMs. There is no pneumothorax. There is no displaced fracture. IMPRESSION: 1. No acute displaced fracture. 2. No acute cardiopulmonary process. 3. Again noted is a right lower lobe opacity which is favored to represent the patient's known AVM. Electronically Signed   By: Constance Holster M.D.   On: 06/13/2019 01:53    Assessment & Plan:   Tilghman was seen today for cough, hypothyroidism and anemia.  Diagnoses and all orders for this visit:  Acquired hypothyroidism- His TSH is in the normal range.  He will remain on the current dose of levothyroxine. 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 He is symptomatic with this. I recommended that he try a course of demeclocycline. -     Basic metabolic panel; Future -     Sodium, urine, random; Future -     Basic metabolic panel -     Sodium, urine, random  Iron deficiency anemia due to chronic blood loss- His H&H are normal but his iron level is high.  I have asked him to hold the iron supplement for now. -     CBC with Differential/Platelet; Future -     IBC panel; Future -     Ferritin; Future -     Ferritin -     IBC panel -     CBC with Differential/Platelet  Benign prostatic hyperplasia with urinary obstruction- His symptoms are well controlled with the peripheral alpha-blocker. -     silodosin (RAPAFLO) 8 MG CAPS capsule; TAKE ONE CAPSULE BY MOUTH DAILY AT BEDTIME  Cough, persistent- Will try a higher dose of gabapentin to improve symptom relief. -      gabapentin (NEURONTIN) 100 MG capsule; Take 2 capsules (200 mg total) by mouth 2 (two) times daily.  Laryngopharyngeal reflux (LPR) -     pantoprazole (PROTONIX) 40 MG tablet; Take 1 tablet (40 mg total) by mouth 2 (two) times daily.  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  Irritable bowel syndrome with diarrhea- He is getting adequate symptom relief with Librax.  Will continue. -     clidinium-chlordiazePOXIDE (LIBRAX) 5-2.5 MG capsule; Take 1 capsule by mouth 3 (three) times daily before meals.  SIADH (syndrome of inappropriate ADH production) ([facility]) -     demeclocycline (DECLOMYCIN) 150 MG tablet; Take 1 tablet (150 mg total) by mouth 2 (two) times daily.   I have discontinued [name] "[name]"'s FERROUS SULFATE PO, meclizine, metaxalone, and magic mouthwash. I have also changed his gabapentin, pantoprazole, montelukast, and clidinium-chlordiazePOXIDE. Additionally, I am having him start on demeclocycline. Lastly, I am having him maintain his Magnesium, Vitamin D3, vitamin C, levocetirizine, metoprolol succinate, nystatin, levothyroxine, and silodosin.  Meds ordered this encounter  Medications  . silodosin (RAPAFLO) 8 MG CAPS capsule    Sig: TAKE ONE CAPSULE BY MOUTH DAILY AT BEDTIME    Dispense:  90 capsule    Refill:  1  . gabapentin (NEURONTIN) 100 MG capsule    Sig: Take 2 capsules (200 mg total) by mouth 2 (two) times daily.    Dispense:  360 capsule    Refill:  1  . pantoprazole (PROTONIX) 40 MG tablet    Sig: Take 1 tablet (40 mg total) by mouth 2 (two) times daily.  Dispense:  180 tablet    Refill:  1  . montelukast (SINGULAIR) 10 MG tablet    Sig: Take 1 tablet (10 mg total) by mouth at bedtime.    Dispense:  90 tablet    Refill:  1  . clidinium-chlordiazePOXIDE (LIBRAX) 5-2.5 MG capsule    Sig: Take 1 capsule by mouth 3 (three) times daily before  meals.    Dispense:  60 capsule    Refill:  3  . demeclocycline (DECLOMYCIN) 150 MG tablet    Sig: Take 1 tablet (150 mg total) by mouth 2 (two) times daily.    Dispense:  180 tablet    Refill:  0   I spent 60 minutes in preparing to see the patient by review of recent labs, imaging and procedures, obtaining and reviewing separately obtained history, communicating with the patient and family or caregiver, ordering medications, tests or procedures, and documenting clinical information in the EHR including the differential Dx, treatment, and any further evaluation and other management of 1. Acquired hypothyroidism 2. Chronic hyponatremia 3. Iron deficiency anemia due to chronic blood loss 4. Benign prostatic hyperplasia with urinary obstruction 5. Cough, persistent 6. Laryngopharyngeal reflux (LPR) 7. 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 Irritable bowel syndrome with diarrhea 9. SIADH (syndrome of inappropriate ADH production) ([place])    Follow-up: Return in about 6 months (around 10/23/2020).  Scarlette Calico, MD

## 2020-04-23 DIAGNOSIS — E222 Syndrome of inappropriate secretion of antidiuretic hormone: Secondary | ICD-10-CM | POA: Insufficient documentation

## 2020-04-23 LAB — SODIUM, URINE, RANDOM: Sodium, Ur: 61 mmol/L (ref 28–272)

## 2020-04-24 MED ORDER — DEMECLOCYCLINE HCL 150 MG PO TABS: 150.0000 mg | ORAL_TABLET | Freq: Two times a day (BID) | ORAL | 0 refills | Status: DC

## 2020-04-27 ENCOUNTER — Other Ambulatory Visit: Payer: Self-pay | Admitting: Internal Medicine

## 2020-04-27 DIAGNOSIS — K219 Gastro-esophageal reflux disease without esophagitis: Secondary | ICD-10-CM

## 2020-05-14 ENCOUNTER — Ambulatory Visit: Payer: Medicare Other | Admitting: Neurology

## 2020-05-15 ENCOUNTER — Ambulatory Visit: Payer: Medicare Other | Admitting: Neurology

## 2020-05-26 ENCOUNTER — Other Ambulatory Visit: Payer: Self-pay | Admitting: Internal Medicine

## 2020-05-26 DIAGNOSIS — E039 Hypothyroidism, unspecified: Secondary | ICD-10-CM

## 2020-06-01 ENCOUNTER — Emergency Department (HOSPITAL_COMMUNITY): Payer: Medicare Other

## 2020-06-01 ENCOUNTER — Emergency Department (HOSPITAL_COMMUNITY)
Admission: EM | Admit: 2020-06-01 | Discharge: 2020-06-01 | Disposition: A | Discharge status: Home / Self Care | Payer: Medicare Other | Attending: Emergency Medicine | Admitting: Emergency Medicine

## 2020-06-01 DIAGNOSIS — R55 Syncope and collapse: Secondary | ICD-10-CM

## 2020-06-01 DIAGNOSIS — R001 Bradycardia, unspecified: Secondary | ICD-10-CM | POA: Insufficient documentation

## 2020-06-01 DIAGNOSIS — E039 Hypothyroidism, unspecified: Secondary | ICD-10-CM | POA: Diagnosis not present

## 2020-06-01 DIAGNOSIS — Z79899 Other long term (current) drug therapy: Secondary | ICD-10-CM | POA: Insufficient documentation

## 2020-06-01 LAB — CBC WITH DIFFERENTIAL/PLATELET
Abs Immature Granulocytes: 0.02 10*3/uL (ref 0.00–0.07)
Basophils Absolute: 0 10*3/uL (ref 0.0–0.1)
Basophils Relative: 1 %
Eosinophils Absolute: 0.1 10*3/uL (ref 0.0–0.5)
Eosinophils Relative: 1 %
HCT: 42 % (ref 39.0–52.0)
Hemoglobin: 14.2 g/dL (ref 13.0–17.0)
Immature Granulocytes: 0 %
Lymphocytes Relative: 30 %
Lymphs Abs: 1.8 10*3/uL (ref 0.7–4.0)
MCH: 30.5 pg (ref 26.0–34.0)
MCHC: 33.8 g/dL (ref 30.0–36.0)
MCV: 90.1 fL (ref 80.0–100.0)
Monocytes Absolute: 0.7 10*3/uL (ref 0.1–1.0)
Monocytes Relative: 11 %
Neutro Abs: 3.6 10*3/uL (ref 1.7–7.7)
Neutrophils Relative %: 57 %
Platelets: 263 10*3/uL (ref 150–400)
RBC: 4.66 MIL/uL (ref 4.22–5.81)
RDW: 12.9 % (ref 11.5–15.5)
WBC: 6.2 10*3/uL (ref 4.0–10.5)
nRBC: 0 % (ref 0.0–0.2)

## 2020-06-01 LAB — CBG MONITORING, ED: Glucose-Capillary: 128 mg/dL — ABNORMAL HIGH (ref 70–99)

## 2020-06-01 LAB — TROPONIN I (HIGH SENSITIVITY)
Troponin I (High Sensitivity): <2 ng/L (ref <18)
Troponin I (High Sensitivity): <2 ng/L (ref <18)

## 2020-06-01 LAB — BASIC METABOLIC PANEL
Anion gap: 8 (ref 5–15)
BUN: 11 mg/dL (ref 8–23)
CO2: 26 mmol/L (ref 22–32)
Calcium: 9.2 mg/dL (ref 8.9–10.3)
Chloride: 98 mmol/L (ref 98–111)
Creatinine, Ser: 1.07 mg/dL (ref 0.61–1.24)
GFR calc Af Amer: >60 mL/min (ref >60)
GFR calc non Af Amer: >60 mL/min (ref >60)
Glucose, Bld: 137 mg/dL — ABNORMAL HIGH (ref 70–99)
Potassium: 4.4 mmol/L (ref 3.5–5.1)
Sodium: 132 mmol/L — ABNORMAL LOW (ref 135–145)

## 2020-06-01 LAB — TSH: TSH: 6.583 u[IU]/mL — ABNORMAL HIGH (ref 0.350–4.500)

## 2020-06-01 MED ORDER — SODIUM CHLORIDE 0.9 % IV SOLN: INTRAVENOUS | Status: DC

## 2020-06-01 MED ORDER — SODIUM CHLORIDE 0.9 % IV BOLUS
1000.0000 mL | Freq: Once | INTRAVENOUS | Status: AC
  Administered 2020-06-01: 1000 mL via INTRAVENOUS

## 2020-06-01 NOTE — ED Notes (Signed)
Patient verbalizes understanding of discharge instructions. Opportunity for questioning and answers were provided. Armband removed by staff, pt discharged from ED.  

## 2020-06-01 NOTE — ED Triage Notes (Signed)
Pt arrives with [facility] from church c/o near syncopal episode. Pt denies LOC, but was lethargic upon EMS arrival. Pt has hx of afib and nosebleeds and is not on any blood thinners. Pt's SBP was in the 80's per EMS, was given 250 ml NS; SBP increased to 50. Pt is alert and oriented x4 upon arrival to ED.   BP 134/58 HR 50 SR 100 % O2 on RA

## 2020-06-01 NOTE — ED Provider Notes (Signed)
[facility] EMERGENCY DEPARTMENT Provider Note   CSN: [id number] Arrival date & time: 06/01/20  1035     History Chief Complaint  Patient presents with  . Near Syncope    Gregory [name] is a 79 y.o. male.  Pt presents to the ED today with a syncopal episode at church this am.  Pt said he was sitting on the pew and felt dizzy.  He told his wife and was about to get up to walk to the back to put his head between his knees when he slumped over to the side.  Pt does not remember this, but the wife said he was not fully out.  EMS said his BP was in the 80s when they arrived.  He was diaphoretic.  HR was in the 30s.  ? CHB.  No p waves visible on their strip.  Pt is now in SB and BP has improved after IVFs given by EMS.  Pt is on metoprolol for a hx of aflutter.  He is not anticoagulated due to a hx of osler-weber-rendu disease and frequent bleeding.  Pt was recently put on neurontin for a chronic cough.  The dose was increased.  Pt does not feel like it has helped the cough much.  The pt said he had a syncopal event a few weeks ago and then again today.  He contributes it to the neurontin.        Past Medical History:  Diagnosis Date  . Anxiety state, unspecified   . Arthritis   . Benign neoplasm of colon   . GERD (gastroesophageal reflux disease)   . 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 History of nonmelanoma skin cancer EXCISION SQUAMOUS CELL FROM HAND  . Hypertrophy of prostate with urinary obstruction and other lower urinary tract symptoms (LUTS)   . Irritable bowel syndrome   . Lumbago   . Other diseases of nasal cavity and sinuses(478.19)   . Persistent disorder of initiating or maintaining sleep   . Plantar fascial fibromatosis   . Pure hypercholesterolemia   . 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 Unspecified hypothyroidism     Patient Active Problem List   Diagnosis Date Noted  . SIADH (syndrome of inappropriate ADH production) ([place]) 04/23/2020  . Chronic hyponatremia 10/24/2019  . Cough, persistent 10/24/2019  . Laryngopharyngeal reflux (LPR) 01/29/2019  . 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 Seasonal allergic rhinitis due to pollen 06/27/2018  . Peripheral neuropathy 01/01/2016  . Atrial flutter ([place]) 08/07/2015  . Encounter for therapeutic drug monitoring   . Osteoarthritis 04/01/2015  . Iron deficiency anemia due to chronic blood loss 10/08/2014  . 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 HYPERCHOLESTEROLEMIA, MILD 07/25/2008  . CARCINOMA, SKIN, SQUAMOUS CELL 02/10/2008  . INSOMNIA, CHRONIC 02/10/2008  . GERD 02/10/2008  . Benign prostatic hyperplasia with urinary obstruction 02/10/2008  . 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 Hypothyroidism 02/08/2008  . Irritable bowel syndrome 02/08/2008  . BACK PAIN, LUMBAR 02/08/2008    Past Surgical History:  Procedure Laterality Date  . APPLICATION OF CRANIAL NAVIGATION N/A 05/16/2015   Procedure: APPLICATION OF CRANIAL NAVIGATION;  Surgeon: Consuella Lose, MD;  Location: [facility];  Service: Neurosurgery;  Laterality: N/A;  . BRAIN BIOPSY Left 05/16/2015   Procedure:  Stereotactic Left Brain Biopsy with Brain Lab;  Surgeon: Consuella Lose, MD;  Location: [facility];  Service: Neurosurgery;  Laterality: Left;  Stereotactic Left Brain biopsy with brainlab  . BRAIN SURGERY    . CARDIOVASCULAR STRESS TEST  01-14-2003   NO ISCHEMIA / EF 61%/ NORMAL LE WALL MOTION  . EXCISION LEFT WRIST GANGLIAN/ MYXOID CYST  05-30-2009  . IR RADIOLOGIST EVAL & MGMT  02/01/2019  . 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 OTHER SURGICAL HISTORY     pulse laser for facial  telangiectasias inthe past  . removal of skin cancer from forehead  10/2012   Dr. Renda Rolls  . TRANSTHORACIC ECHOCARDIOGRAM  10-17-2008   DR CRENSHAW   NORMAL LVF/ EF 60%/  MILDLY DILATED RIGHT ATRIUM/ VENTRICULE  . VARICOCELECTOMY  1991   Dr. Tresa Endo       Family History  Problem Relation Age of Onset  . Diabetes Mother   . Hypertension Mother   . Pancreatic cancer Mother   . Stroke Mother   . Kidney failure Father   . Hypertension Sister   . Healthy Son     Social History   Tobacco Use  . Smoking status: Never Smoker  . Smokeless tobacco: Never Used  Vaping Use  . Vaping Use: Never used  Substance Use Topics  . Alcohol use: No    Alcohol/week: 0.0 standard drinks  . Drug use: No    Home Medications Prior to Admission medications   Medication Sig Start Date End Date Taking? Authorizing Provider  acetaminophen (TYLENOL) 325 MG tablet Take 650 mg by mouth every 6 (six) hours as needed for mild pain.   Yes [provider]  Cholecalciferol (VITAMIN D3) 2000 UNITS TABS Take 5,000 Units by mouth daily.    Yes [provider]  clidinium-chlordiazePOXIDE (LIBRAX) 5-2.5 MG capsule Take 1 capsule by mouth 3 (three) times daily before meals. Patient taking differently: Take 1 capsule by mouth as needed (stomach cramps).  04/22/20  Yes Janith Lima, MD  clorazepate (TRANXENE) 7.5 MG tablet Take 7.5 mg by mouth at bedtime as needed for anxiety.   Yes [provider]  demeclocycline (DECLOMYCIN) 150 MG tablet Take 1 tablet (150 mg total) by mouth 2 (two) times daily. 04/24/20  Yes Janith Lima, MD  gabapentin (NEURONTIN) 100 MG capsule Take 2 capsules (200 mg total) by mouth 2 (two) times daily. 04/22/20  Yes Janith Lima, MD  levocetirizine (XYZAL) 5 MG tablet Take 1 tablet (5 mg total) by mouth every evening. 06/27/18  Yes Janith Lima, MD  levothyroxine (SYNTHROID) 125 MCG tablet TAKE 1 TABLET BY MOUTH  DAILY Patient taking differently: Take  125 mcg by mouth daily before breakfast. TAKE 1 TABLET BY MOUTH  DAILY 05/27/20  Yes Janith Lima, MD  Magnesium 250 MG TABS Take 250 mg by mouth at bedtime.    Yes [provider]  meclizine (ANTIVERT) 25 MG tablet Take 25 mg by mouth 2 (two) times daily as needed for dizziness.   Yes [provider]  metaxalone (SKELAXIN) 800 MG tablet Take 800 mg by mouth as needed for muscle spasms.   Yes [provider]  metoprolol succinate (TOPROL-XL) 25 MG 24 hr tablet TAKE 0.5  TABLET BY MOUTH  DAILY Patient taking differently: Take 12.5 mg by mouth daily. TAKE 0.5  TABLET BY MOUTH  DAILY 01/22/20  Yes Scarlette Calico  L, MD  montelukast (SINGULAIR) 10 MG tablet Take 1 tablet (10 mg total) by mouth at bedtime. 04/22/20  Yes Janith Lima, MD  Multiple Vitamins-Minerals [facility] FOR HIM 50+) TABS Take 1 tablet by mouth daily.   Yes [provider]  nystatin (MYCOSTATIN) 100000 UNIT/ML suspension Take 5 mLs by mouth 4 (four) times daily as needed (sore throat).    Yes [provider]  pantoprazole (PROTONIX) 40 MG tablet TAKE 1 TABLET BY MOUTH  TWICE DAILY Patient taking differently: Take 40 mg by mouth 2 (two) times daily.  04/29/20  Yes Marrian Salvage, FNP  saw palmetto 500 MG capsule Take 500 mg by mouth 2 (two) times daily.   Yes [provider]  silodosin (RAPAFLO) 8 MG CAPS capsule TAKE ONE CAPSULE BY MOUTH DAILY AT BEDTIME Patient taking differently: Take 8 mg by mouth at bedtime. TAKE ONE CAPSULE BY MOUTH DAILY AT BEDTIME  04/22/20  Yes Janith Lima, MD    Allergies    Nsaids, Other, Aspirin, Statins, and Tizanidine hcl  Review of Systems   Review of Systems  Neurological: Positive for syncope.  All other systems reviewed and are negative.   Physical Exam Updated Vital Signs BP (!) 141/63   Pulse (!) 58   Temp (!) 97.5 F (36.4 C) (Oral)   Resp 16   SpO2 96%   Physical Exam Vitals and nursing note reviewed.  Constitutional:       Appearance: Normal appearance.  HENT:     Head: Normocephalic and atraumatic.     Right Ear: External ear normal.     Left Ear: External ear normal.     Nose: Nose normal.     Mouth/Throat:     Mouth: Mucous membranes are moist.     Pharynx: Oropharynx is clear.  Eyes:     Extraocular Movements: Extraocular movements intact.     Conjunctiva/sclera: Conjunctivae normal.     Pupils: Pupils are equal, round, and reactive to light.  Cardiovascular:     Rate and Rhythm: Regular rhythm. Bradycardia present.     Pulses: Normal pulses.     Heart sounds: Normal heart sounds.  Pulmonary:     Effort: Pulmonary effort is normal.     Breath sounds: Normal breath sounds.  Abdominal:     General: Abdomen is flat. Bowel sounds are normal.     Palpations: Abdomen is soft.  Musculoskeletal:        General: Normal range of motion.     Cervical back: Normal range of motion and neck supple.  Skin:    General: Skin is warm.     Capillary Refill: Capillary refill takes less than 2 seconds.  Neurological:     General: No focal deficit present.     Mental Status: He is alert and oriented to person, place, and time.  Psychiatric:        Mood and Affect: Mood normal.        Behavior: Behavior normal.        Thought Content: Thought content normal.        Judgment: Judgment normal.     ED Results / Procedures / Treatments   Labs (all labs ordered are listed, but only abnormal results are displayed) Labs Reviewed  BASIC METABOLIC PANEL - Abnormal; Notable for the following components:      Result Value   Sodium 132 (*)    Glucose, Bld 137 (*)    All other components within normal  limits  TSH - Abnormal; Notable for the following components:   TSH 6.583 (*)    All other components within normal limits  CBG MONITORING, ED - Abnormal; Notable for the following components:   Glucose-Capillary 128 (*)    All other components within normal limits  CBC WITH DIFFERENTIAL/PLATELET  URINALYSIS,  ROUTINE W REFLEX MICROSCOPIC  TROPONIN I (HIGH SENSITIVITY)  TROPONIN I (HIGH SENSITIVITY)    EKG EKG Interpretation  Date/Time:  Sunday June 01 2020 10:51:40 EDT Ventricular Rate:  54 PR Interval:    QRS Duration: 107 QT Interval:  479 QTC Calculation: 097 R Axis:   42 Text Interpretation: Sinus rhythm Low voltage, extremity leads Since last tracing rate slower Confirmed by Isla Pence [phone]) on 06/01/2020 10:54:05 AM   Radiology DG Chest Port 1 View  Result Date: 06/01/2020 CLINICAL DATA:  Near syncopal episode. 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 EXAM: PORTABLE CHEST 1 VIEW COMPARISON:  06/13/2019; 04/17/2018; CT abdomen pelvis-05/29/2019; chest CT-12/15/2018 FINDINGS: Grossly unchanged cardiac silhouette and mediastinal contours. Improved aeration of the lungs. The lungs remain hyperexpanded with flattening the diaphragms and mild diffuse slightly nodular thickening pulmonary interstitium. Nodular opacity overlying the right lower lung compatible with known right lower lobe AVM demonstrated on chest CT performed 12/15/2018. No new focal airspace opacities. No pleural effusion or pneumothorax. No evidence of edema. No acute osseous abnormalities. IMPRESSION: 1. Improved aeration of the lungs with persistent findings of lung hyperexpansion and chronic bronchitic change. 2. Right lower lung nodular opacity compatible with known pulmonary AVM demonstrated on remote dedicated chest CT performed 11/2018. Electronically Signed   By: Sandi Mariscal M.D.   On: 06/01/2020 11:22    Procedures Procedures (including critical care time)  Medications Ordered in ED Medications  sodium chloride 0.9 % bolus 1,000 mL (1,000 mLs Intravenous New Bag/Given 06/01/20 1236)    And  0.9 %  sodium chloride infusion (has no administration in time range)    ED Course  I have reviewed the triage vital signs and the nursing notes.  Pertinent labs & imaging results that were available  during my care of the patient were reviewed by me and considered in my medical decision making (see chart for details).    MDM Rules/Calculators/A&P                          HR and BP have fine since arrival here.  He is not orthostatic after IVFs.  Pt feels good.  Pt wants to stop neurontin first.  I will have him decrease dose for 2 days then stop.  If he has any further episodes of bradycardia or hypotension; he will need to stop the Toprol XL.  Return if worse.  F/u with pcp and with cards. Final Clinical Impression(s) / ED Diagnoses Final diagnoses:  Near syncope  Bradycardia    Rx / DC Orders ED Discharge Orders    None       Isla Pence, MD 06/01/20 1350

## 2020-06-01 NOTE — Discharge Instructions (Signed)
Decrease neurontin to 1 pill twice a day for 2 days then stop taking. If any further episodes of low heart rate or blood pressure, you need to stop Toprol XL.

## 2020-06-02 ENCOUNTER — Telehealth: Payer: Self-pay | Admitting: Cardiology

## 2020-06-02 NOTE — Telephone Encounter (Signed)
  Pt passed out in [place] yesterday and does not remember anything about passing out. He went to the ER and the ER told him to follow up with Dr. Stanford Breed or a PA ASAP. The first available appointment was not until 07/01 and the patient did not want to wait that long. The patient just asked to speak with Hilda Blades

## 2020-06-02 NOTE — Telephone Encounter (Signed)
Spoke with pt, he passed out at church yesterday and does not remember anything about it. His wife reports his bp was 23 and EMS had trouble finding his pulse and it was in the 30's. He reports at home his average bp is 140/80 and pulse of 55. He was recently started on gabapentin for a chronic cough and that dose was recently doubled. In the ER they felt like that medication was the cause of the problem and he is in the process of weaning to stop the medication. He feels fine today and called because the ER insisted he call. Patient away he can not drive for 6 months. Will forward for dr Stanford Breed review and Pt agreed with this plan.

## 2020-06-03 NOTE — Telephone Encounter (Signed)
Patient scheduled for OV with Angie PA on 6/16 @ 10:45am. Agreed w/ MD plan for in-office appt

## 2020-06-03 NOTE — Telephone Encounter (Signed)
APP ov Gregory [name]   

## 2020-06-03 NOTE — Telephone Encounter (Signed)
Follow up   Pt is calling back to follow up

## 2020-06-04 ENCOUNTER — Encounter: Payer: Self-pay | Admitting: Physician Assistant

## 2020-06-04 ENCOUNTER — Other Ambulatory Visit: Payer: Self-pay

## 2020-06-04 ENCOUNTER — Ambulatory Visit (INDEPENDENT_AMBULATORY_CARE_PROVIDER_SITE_OTHER): Payer: Medicare Other | Admitting: Physician Assistant

## 2020-06-04 VITALS — BP 140/78 | HR 60 | Temp 97.9°F | Ht 70.0 in | Wt 187.0 lb

## 2020-06-04 DIAGNOSIS — E039 Hypothyroidism, unspecified: Secondary | ICD-10-CM | POA: Diagnosis not present

## 2020-06-04 DIAGNOSIS — R55 Syncope and collapse: Secondary | ICD-10-CM | POA: Diagnosis not present

## 2020-06-04 DIAGNOSIS — I951 Orthostatic hypotension: Secondary | ICD-10-CM

## 2020-06-04 DIAGNOSIS — I483 Typical atrial flutter: Secondary | ICD-10-CM

## 2020-06-04 DIAGNOSIS — I5032 Chronic diastolic (congestive) heart failure: Secondary | ICD-10-CM

## 2020-06-04 NOTE — Progress Notes (Signed)
Cardiology Office Note:    Date:  06/04/2020   ID:  Clemon [name], DOB 09/21/1941, MRN [id number]  PCP:  Gregory Lima, MD  Cardiologist:  Gregory Ruths, MD   Referring MD: Gregory Lima, MD   Chief Complaint  Patient presents with   Follow-up    syncope    History of Present Illness:    Gregory [name] is a 79 y.o. 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 Following discharge for this, he was found to have atrial flutter, converted to sinus rhythm spontaneously. He was started on toprol. Heart monitor revealed atrial flutter vs ectopic atrial tachycardia. Echo 07/2015 with preserved EF and grade 2 DD.  Seen in 2018 for dizziness felt to be orthostatic in nature. BB decreased. Last seen by Dr. Stanford [name] 11/26/19 and was doing well at that time.   Pt visited the ER 06/01/20 following a syncopal episode at church. Pt felt dizzy and then slumped over. Per his wife, he did have a complete loss of consciousness. On arrival, EMS noted BP was in the 80s and his HR in the 30s. There was question of CHB as p waves were not easily seen on EMS strip. IVF resuscitation in the ER with sinus bradycardia and improvement in BP. He was discharged without admission and close cardiology follow up. Pt does report a syncopal event a few weeks ago.   He presents today with his wife who helps with history. He started taking gabapentin for cough. He had titrated his gabapentin to 200 mg BID the morning of the syncopal event and questions if this is the cause of his syncopal episode. The previous near-syncopal episode a few weeks ago also coincided with an increase of gabapentin dose. Only preceding symptoms was dizziness, no chest pain, SOB, lower extremity swelling, or orthopnea.   Past Medical History:  Diagnosis Date   Anxiety state,  unspecified    Arthritis    Benign neoplasm of colon    GERD (gastroesophageal reflux disease)    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  WITHOUT HEMOPTYSIS   History of nonmelanoma skin cancer EXCISION SQUAMOUS CELL FROM HAND   Hypertrophy of prostate with urinary obstruction and other lower urinary tract symptoms (LUTS)    Irritable bowel syndrome    Lumbago    Other diseases of nasal cavity and sinuses(478.19)    Persistent disorder of initiating or maintaining sleep    Plantar fascial fibromatosis    Pure hypercholesterolemia    Telangiectasia, hereditary hemorrhagic, of Rendu, 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  ([facility])   SKIN, LIPS, NASAL W/ PREVIOUS NOSE BLEEDS  AMD GI TELANGIECTASIA   Unspecified hypothyroidism     Past Surgical History:  Procedure Laterality Date   APPLICATION OF CRANIAL NAVIGATION N/A 05/16/2015   Procedure: APPLICATION OF CRANIAL NAVIGATION;  Surgeon: Gregory Lose, MD;  Location: [facility];  Service: Neurosurgery;  Laterality: N/A;   BRAIN BIOPSY Left 05/16/2015   Procedure: Stereotactic Left Brain Biopsy with Brain Lab;  Surgeon: Gregory Lose, MD;  Location: [facility];  Service: Neurosurgery;  Laterality: Left;  Stereotactic Left Brain biopsy with brainlab   BRAIN SURGERY     CARDIOVASCULAR STRESS TEST  01-14-2003   NO ISCHEMIA / EF 61%/ NORMAL LE WALL MOTION   EXCISION LEFT WRIST GANGLIAN/ MYXOID  CYST  05-30-2009   IR RADIOLOGIST EVAL & MGMT  02/01/2019   NASAL SINUS SURGERY     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 Renda [name]   TRANSTHORACIC ECHOCARDIOGRAM  10-17-2008   DR CRENSHAW   NORMAL LVF/ EF 60%/  MILDLY DILATED RIGHT ATRIUM/ VENTRICULE   VARICOCELECTOMY  1991   Dr. Tresa [name]    Current Medications: Current Meds  Medication Sig     acetaminophen (TYLENOL) 325 MG tablet Take 650 mg by mouth every 6 (six) hours as needed for mild pain.   Cholecalciferol (VITAMIN D3) 2000 UNITS TABS Take 5,000 Units by mouth daily.    clidinium-chlordiazePOXIDE (LIBRAX) 5-2.5 MG capsule Take 1 capsule by mouth 3 (three) times daily before meals. (Patient taking differently: Take 1 capsule by mouth as needed (stomach cramps). )   clorazepate (TRANXENE) 7.5 MG tablet Take 7.5 mg by mouth at bedtime as needed for anxiety.   demeclocycline (DECLOMYCIN) 150 MG tablet Take 1 tablet (150 mg total) by mouth 2 (two) times daily.   gabapentin (NEURONTIN) 100 MG capsule Take 2 capsules (200 mg total) by mouth 2 (two) times daily.   levocetirizine (XYZAL) 5 MG tablet Take 1 tablet (5 mg total) by mouth every evening.   levothyroxine (SYNTHROID) 125 MCG tablet TAKE 1 TABLET BY MOUTH  DAILY (Patient taking differently: Take 125 mcg by mouth daily before breakfast. TAKE 1 TABLET BY MOUTH  DAILY)   Magnesium 250 MG TABS Take 250 mg by mouth at bedtime.    meclizine (ANTIVERT) 25 MG tablet Take 25 mg by mouth 2 (two) times daily as needed for dizziness.   metaxalone (SKELAXIN) 800 MG tablet Take 800 mg by mouth as needed for muscle spasms.   metoprolol succinate (TOPROL-XL) 25 MG 24 hr tablet TAKE 0.5  TABLET BY MOUTH  DAILY (Patient taking differently: Take 12.5 mg by mouth daily. TAKE 0.5  TABLET BY MOUTH  DAILY)   montelukast (SINGULAIR) 10 MG tablet Take 1 tablet (10 mg total) by mouth at bedtime.   Multiple Vitamins-Minerals (MULTI FOR HIM 50+) TABS Take 1 tablet by mouth daily.   nystatin (MYCOSTATIN) 100000 UNIT/ML suspension Take 5 mLs by mouth 4 (four) times daily as needed (sore throat).    pantoprazole (PROTONIX) 40 MG tablet TAKE 1 TABLET BY MOUTH  TWICE DAILY (Patient taking differently: Take 40 mg by mouth 2 (two) times daily. )   saw palmetto 500 MG capsule Take 500 mg by mouth 2 (two) times daily.   silodosin (RAPAFLO) 8 MG  CAPS capsule TAKE ONE CAPSULE BY MOUTH DAILY AT BEDTIME (Patient taking differently: Take 8 mg by mouth at bedtime. TAKE ONE CAPSULE BY MOUTH DAILY AT BEDTIME )     Allergies:   Nsaids, Other, Aspirin, Statins, and Tizanidine hcl   Social History   Socioeconomic History   Marital status: Married    Spouse name: Ann   Number of children: 2   Years of education: Not on file   Highest education level: Master's degree (e.g., MA, MS, MEng, MEd, MSW, MBA)  Occupational History    Comment: Consultant  Tobacco Use   Smoking status: Never Smoker   Smokeless tobacco: Never Used  Vaping Use   Vaping Use: Never used  Substance and Sexual Activity   Alcohol use: No  Alcohol/week: 0.0 standard drinks   Drug use: No   Sexual activity: Not on file  Other Topics Concern   Not on file  Social History Narrative   Lives with wife in a one story home.  Has 2 sons.  Retired Veterinary surgeon.  Education: Masters degree.      05/09/19- Pt states that his balance is worse since last visit. He has not fallen but has come close on a few occassions. The weakness on his right side is the same, he states.   Social Determinants of Health   Financial Resource Strain:    Difficulty of Paying Living Expenses:   Food Insecurity:    Worried About Charity fundraiser in the Last Year:    Arboriculturist in the Last Year:   Transportation Needs:    Film/video editor (Medical):    Lack of Transportation (Non-Medical):   Physical Activity:    Days of Exercise per Week:    Minutes of Exercise per Session:   Stress:    Feeling of Stress :   Social Connections:    Frequency of Communication with Friends and Family:    Frequency of Social Gatherings with Friends and Family:    Attends Religious Services:    Active Member of Clubs or Organizations:    Attends Archivist Meetings:    Marital Status:      Family History: The patient's family history includes  Diabetes in his mother; Healthy in his son; Hypertension in his mother and sister; Kidney failure in his father; Pancreatic cancer in his mother; Stroke in his mother.  ROS:   Please see the history of present illness.     All other systems reviewed and are negative.  EKGs/Labs/Other Studies Reviewed:    The following studies were reviewed today:  Echo 08/14/15: Study Conclusions   - Left ventricle: The cavity size was normal. Wall thickness was  normal. Systolic function was normal. The estimated ejection  fraction was in the range of 55% to 60%. Wall motion was normal;  there were no regional wall motion abnormalities. Features are  consistent with a pseudonormal left ventricular filling pattern,  with concomitant abnormal relaxation and increased filling  pressure (grade 2 diastolic dysfunction).   Impressions:   - Normal LV systolic function; grade 2 diastolic dysfunction; trace  MR and TR.   EKG:  EKG is  ordered today.  The ekg ordered today demonstrates sinus bradycardia with HR 50  Recent Labs: 06/01/2020: BUN 11; Creatinine, Ser 1.07; Hemoglobin 14.2; Platelets 263; Potassium 4.4; Sodium 132; TSH 6.583  Recent Lipid Panel    Component Value Date/Time   CHOL 145 04/24/2019 0831   TRIG 80.0 04/24/2019 0831   HDL 40.00 04/24/2019 0831   CHOLHDL 4 04/24/2019 0831   VLDL 16.0 04/24/2019 0831   LDLCALC 89 04/24/2019 0831   LDLDIRECT 139.4 06/15/2007 0946    Physical Exam:    VS:  BP 140/78    Pulse 60    Temp 97.9 F (36.6 C)    Ht 5\' 10"  (1.778 m)    Wt 187 lb (84.8 kg)    SpO2 93%    BMI 26.83 kg/m     Wt Readings from Last 3 Encounters:  06/04/20 187 lb (84.8 kg)  04/22/20 184 lb 6 oz (83.6 kg)  04/02/20 184 lb (83.5 kg)     GEN: *Well nourished, well developed in no acute distress HEENT: Normal NECK: No JVD; No carotid  bruits LYMPHATICS: No lymphadenopathy CARDIAC: RRR, no murmurs, rubs, gallops RESPIRATORY:  Clear to auscultation without  rales, wheezing or rhonchi  ABDOMEN: Soft, non-tender, non-distended MUSCULOSKELETAL:  No edema; No deformity  SKIN: Warm and dry NEUROLOGIC:  Alert and oriented x 3 PSYCHIATRIC:  Normal affect   ASSESSMENT:    1. Syncope and collapse   2. Typical atrial flutter ([place])   3. Orthostatic hypotension   4. Acquired hypothyroidism   5. Chronic diastolic heart failure (HCC)    PLAN:    In order of problems listed above:  Syncope/near-syncope History of atrial flutter vs ectopic atrial tachycardia History of orthostatic hypotension improved with reduction in BB - need to rule out arrhythmia and heart block - unfortunately, I can't see the EMS strips  - will place a 30-day event monitor - discussed D/C BB, but they would like to continue since this controls his flutter - sinus bradycardia today without symptoms - will hold off on medication changes - he will continue weaning down on gabapentin - will also obtain echocardiogram   Hypothyroidism - TSH was elevated in the ER - advised him to follow up with PCP   Chronic diastolic heart failure - euvolemic on exam    Medication Adjustments/Labs and Tests Ordered: Current medicines are reviewed at length with the patient today.  Concerns regarding medicines are outlined above.  Orders Placed This Encounter  Procedures   CARDIAC EVENT MONITOR   EKG 12-Lead   ECHOCARDIOGRAM COMPLETE   No orders of the defined types were placed in this encounter.   Signed, Ledora Bottcher, [place]  06/04/2020 11:48 AM    [facility]

## 2020-06-04 NOTE — Patient Instructions (Signed)
Medication Instructions:  Your physician recommends that you continue on your current medications as directed. Please refer to the Current Medication list given to you today.  *If you need a refill on your cardiac medications before your next appointment, please call your pharmacy*   Testing/Procedures: Your physician has requested that you have an echocardiogram. Echocardiography is a painless test that uses sound waves to create images of your heart. It provides your doctor with information about the size and shape of your heart and how well your heart's chambers and valves are working. This procedure takes approximately one hour. There are no restrictions for this procedure.  Your physician has recommended that you wear a 30 day event monitor. Event monitors are medical devices that record the heart's electrical activity. Doctors most often [place] these monitors to diagnose arrhythmias. Arrhythmias are problems with the speed or rhythm of the heartbeat. The monitor is a small, portable device. You can wear one while you do your normal daily activities. This is usually used to diagnose what is causing palpitations/syncope (passing out).   Follow-Up: At [facility], you and your health needs are our priority.  As part of our continuing mission to provide you with exceptional heart care, we have created designated Provider Care Teams.  These Care Teams include your primary Cardiologist (physician) and Advanced Practice Providers (APPs -  Physician Assistants and Nurse Practitioners) who all work together to provide you with the care you need, when you need it.  We recommend signing up for the patient portal called "MyChart".  Sign up information is provided on this After Visit Summary.  MyChart is used to connect with patients for Virtual Visits (Telemedicine).  Patients are able to view lab/test results, encounter notes, upcoming appointments, etc.  Non-urgent messages can be sent to your provider as  well.   To learn more about what you can do with MyChart, go to [email or web address].    Your next appointment:   6-7 week(s)  The format for your next appointment:   In Person  Provider:   Kirk Ruths, MD

## 2020-06-06 ENCOUNTER — Other Ambulatory Visit: Payer: Self-pay

## 2020-06-06 ENCOUNTER — Ambulatory Visit (HOSPITAL_COMMUNITY): Payer: Medicare Other | Attending: Cardiology

## 2020-06-06 DIAGNOSIS — R55 Syncope and collapse: Secondary | ICD-10-CM

## 2020-06-09 ENCOUNTER — Ambulatory Visit (INDEPENDENT_AMBULATORY_CARE_PROVIDER_SITE_OTHER): Payer: Medicare Other | Admitting: Internal Medicine

## 2020-06-09 ENCOUNTER — Other Ambulatory Visit: Payer: Self-pay

## 2020-06-09 ENCOUNTER — Encounter: Payer: Self-pay | Admitting: Internal Medicine

## 2020-06-09 ENCOUNTER — Telehealth: Payer: Self-pay | Admitting: Cardiology

## 2020-06-09 VITALS — BP 148/76 | HR 77 | Temp 98.1°F | Resp 16 | Ht 70.0 in | Wt 188.1 lb

## 2020-06-09 DIAGNOSIS — E039 Hypothyroidism, unspecified: Secondary | ICD-10-CM | POA: Diagnosis not present

## 2020-06-09 MED ORDER — LEVOTHYROXINE SODIUM 137 MCG PO TABS: 137.0000 ug | ORAL_TABLET | Freq: Every day | ORAL | 1 refills | Status: DC

## 2020-06-09 NOTE — Patient Instructions (Signed)
Hypothyroidism  Hypothyroidism is when the thyroid gland does not make enough of certain hormones (it is underactive). The thyroid gland is a small gland located in the lower front part of the neck, just in front of the windpipe (trachea). This gland makes hormones that help control how the body uses food for energy (metabolism) as well as how the heart and brain function. These hormones also play a role in keeping your bones strong. When the thyroid is underactive, it produces too little of the hormones thyroxine (T4) and triiodothyronine (T3). What are the causes? This condition may be caused by:  Hashimoto's disease. This is a disease in which the body's disease-fighting system (immune system) attacks the thyroid gland. This is the most common cause.  Viral infections.  Pregnancy.  Certain medicines.  Birth defects.  Past radiation treatments to the head or neck for cancer.  Past treatment with radioactive iodine.  Past exposure to radiation in the environment.  Past surgical removal of part or all of the thyroid.  Problems with a gland in the center of the brain (pituitary gland).  Lack of enough iodine in the diet. What increases the risk? You are more likely to develop this condition if:  You are male.  You have a family history of thyroid conditions.  You use a medicine called lithium.  You take medicines that affect the immune system (immunosuppressants). What are the signs or symptoms? Symptoms of this condition include:  Feeling as though you have no energy (lethargy).  Not being able to tolerate cold.  Weight gain that is not explained by a change in diet or exercise habits.  Lack of appetite.  Dry skin.  Coarse hair.  Menstrual irregularity.  Slowing of thought processes.  Constipation.  Sadness or depression. How is this diagnosed? This condition may be diagnosed based on:  Your symptoms, your medical history, and a physical exam.  Blood  tests. You may also have imaging tests, such as an ultrasound or MRI. How is this treated? This condition is treated with medicine that replaces the thyroid hormones that your body does not make. After you begin treatment, it may take several weeks for symptoms to go away. Follow these instructions at home:  Take over-the-counter and prescription medicines only as told by your health care provider.  If you start taking any new medicines, tell your health care provider.  Keep all follow-up visits as told by your health care provider. This is important. ? As your condition improves, your dosage of thyroid hormone medicine may change. ? You will need to have blood tests regularly so that your health care provider can monitor your condition. Contact a health care provider if:  Your symptoms do not get better with treatment.  You are taking thyroid replacement medicine and you: ? Sweat a lot. ? Have tremors. ? Feel anxious. ? Lose weight rapidly. ? Cannot tolerate heat. ? Have emotional swings. ? Have diarrhea. ? Feel weak. Get help right away if you have:  Chest pain.  An irregular heartbeat.  A rapid heartbeat.  Difficulty breathing. Summary  Hypothyroidism is when the thyroid gland does not make enough of certain hormones (it is underactive).  When the thyroid is underactive, it produces too little of the hormones thyroxine (T4) and triiodothyronine (T3).  The most common cause is Hashimoto's disease, a disease in which the body's disease-fighting system (immune system) attacks the thyroid gland. The condition can also be caused by viral infections, medicine, pregnancy, or past   radiation treatment to the head or neck.  Symptoms may include weight gain, dry skin, constipation, feeling as though you do not have energy, and not being able to tolerate cold.  This condition is treated with medicine to replace the thyroid hormones that your body does not make. This information  is not intended to replace advice given to you by your health care provider. Make sure you discuss any questions you have with your health care provider. Document Revised: 11/18/2017 Document Reviewed: 11/16/2017 Elsevier Patient Education  2020 [employer]  

## 2020-06-09 NOTE — Progress Notes (Signed)
Subjective:  Patient ID: Gregory [name], male    DOB: [birth year]  Age: 79 y.o. MRN: [id number]  CC: Hypertension and Hypothyroidism  This visit occurred during the SARS-CoV-2 public health emergency.  Safety protocols were in place, including screening questions prior to the visit, additional usage of staff PPE, and extensive cleaning of exam room while observing appropriate contact time as indicated for disinfecting solutions.    HPI Gregory [name] presents for f/up - He was recently seen in the ED after an episode of syncope.  He was found to have a low heart rate.  He feels like the syncopal episode was caused by increasing the dose of gabapentin.  He has stopped taking the gabapentin and says he feels much better.  He has rare episodes of dizziness that are well controlled with Antivert.  He denies any more syncopal episodes and does not feel presyncopal or lightheaded.  He was told in the emergency room that he needed a higher dose of levothyroxine.  His bradycardia is being investigated by cardiology.  He complains of fatigue.  Outpatient Medications Prior to Visit  Medication Sig Dispense Refill  . acetaminophen (TYLENOL) 325 MG tablet Take 650 mg by mouth every 6 (six) hours as needed for mild pain.    . Cholecalciferol (VITAMIN D3) 2000 UNITS TABS Take 5,000 Units by mouth daily.     . clidinium-chlordiazePOXIDE (LIBRAX) 5-2.5 MG capsule Take 1 capsule by mouth 3 (three) times daily before meals. (Patient taking differently: Take 1 capsule by mouth as needed (stomach cramps). ) 60 capsule 3  . clorazepate (TRANXENE) 7.5 MG tablet Take 7.5 mg by mouth at bedtime as needed for anxiety.    [name] demeclocycline (DECLOMYCIN) 150 MG tablet Take 1 tablet (150 mg total) by mouth 2 (two) times daily. 180 tablet 0  . levocetirizine (XYZAL) 5 MG tablet Take 1 tablet (5 mg total) by mouth every evening. 90 tablet 1  . Magnesium 250 MG TABS Take 250 mg by mouth at bedtime.     . meclizine  (ANTIVERT) 25 MG tablet Take 25 mg by mouth 2 (two) times daily as needed for dizziness.    . metoprolol succinate (TOPROL-XL) 25 MG 24 hr tablet TAKE 0.5  TABLET BY MOUTH  DAILY (Patient taking differently: Take 12.5 mg by mouth daily. TAKE 0.5  TABLET BY MOUTH  DAILY) 90 tablet 1  . montelukast (SINGULAIR) 10 MG tablet Take 1 tablet (10 mg total) by mouth at bedtime. 90 tablet 1  . Multiple Vitamins-Minerals (MULTI FOR HIM 50+) TABS Take 1 tablet by mouth daily.    [name] nystatin (MYCOSTATIN) 100000 UNIT/ML suspension Take 5 mLs by mouth 4 (four) times daily as needed (sore throat).     . pantoprazole (PROTONIX) 40 MG tablet TAKE 1 TABLET BY MOUTH  TWICE DAILY (Patient taking differently: Take 40 mg by mouth 2 (two) times daily. ) 180 tablet 0  . saw palmetto 500 MG capsule Take 500 mg by mouth 2 (two) times daily.    . silodosin (RAPAFLO) 8 MG CAPS capsule TAKE ONE CAPSULE BY MOUTH DAILY AT BEDTIME (Patient taking differently: Take 8 mg by mouth at bedtime. TAKE ONE CAPSULE BY MOUTH DAILY AT BEDTIME ) 90 capsule 1  . levothyroxine (SYNTHROID) 125 MCG tablet TAKE 1 TABLET BY MOUTH  DAILY (Patient taking differently: Take 125 mcg by mouth daily before breakfast. TAKE 1 TABLET BY MOUTH  DAILY) 90 tablet 1  . metaxalone (SKELAXIN) 800 MG tablet Take  800 mg by mouth as needed for muscle spasms.    [name] gabapentin (NEURONTIN) 100 MG capsule Take 2 capsules (200 mg total) by mouth 2 (two) times daily. 360 capsule 1   No facility-administered medications prior to visit.    ROS Review of Systems  Constitutional: Positive for fatigue. Negative for appetite change, diaphoresis and unexpected weight change.  HENT: Negative.   Eyes: Negative.   Respiratory: Negative for cough, chest tightness, shortness of breath and wheezing.   Cardiovascular: Negative for chest pain, palpitations and leg swelling.  Gastrointestinal: Negative for abdominal pain, constipation, diarrhea, nausea and vomiting.  Endocrine:  Negative for cold intolerance and heat intolerance.  Genitourinary: Negative.  Negative for difficulty urinating and hematuria.  Musculoskeletal: Negative.  Negative for arthralgias and myalgias.  Skin: Negative.  Negative for color change.  Neurological: Negative for dizziness, weakness, light-headedness and headaches.  Hematological: Negative for adenopathy. Does not bruise/bleed easily.  Psychiatric/Behavioral: Negative.     Objective:  BP (!) 148/76 (BP Location: Left Arm, Patient Position: Sitting, Cuff Size: Normal)   Pulse 77   Temp 98.1 F (36.7 C) (Oral)   Resp 16   Ht 5\' 10"  (1.778 m)   Wt 188 lb 2 oz (85.3 kg)   SpO2 94%   BMI 26.99 kg/m   BP Readings from Last 3 Encounters:  06/09/20 (!) 148/76  06/04/20 140/78  06/01/20 136/63    Wt Readings from Last 3 Encounters:  06/09/20 188 lb 2 oz (85.3 kg)  06/04/20 187 lb (84.8 kg)  04/22/20 184 lb 6 oz (83.6 kg)    Physical Exam Vitals reviewed.  Constitutional:      Appearance: Normal appearance.  HENT:     Nose: Nose normal.     Mouth/Throat:     Mouth: Mucous membranes are moist.  Eyes:     General: No scleral icterus.    Conjunctiva/sclera: Conjunctivae normal.  Cardiovascular:     Rate and Rhythm: Normal rate and regular rhythm.     Heart sounds: No murmur heard.   Pulmonary:     Effort: Pulmonary effort is normal.     Breath sounds: No stridor. No wheezing, rhonchi or rales.  Abdominal:     General: Abdomen is flat. Bowel sounds are normal. There is no distension.     Palpations: Abdomen is soft. There is no hepatomegaly, splenomegaly or mass.     Tenderness: There is no abdominal tenderness.  Musculoskeletal:        General: Normal range of motion.     Cervical back: Neck supple.     Right lower leg: No edema.     Left lower leg: No edema.  Skin:    General: Skin is warm and dry.  Neurological:     General: No focal deficit present.     Mental Status: He is alert.     Lab Results    Component Value Date   WBC 6.2 06/01/2020   HGB 14.2 06/01/2020   HCT 42.0 06/01/2020   PLT 263 06/01/2020   GLUCOSE 137 (H) 06/01/2020   CHOL 145 04/24/2019   TRIG 80.0 04/24/2019   HDL 40.00 04/24/2019   LDLDIRECT 139.4 06/15/2007   LDLCALC 89 04/24/2019   ALT 12 04/24/2019   AST 15 04/24/2019   NA 132 (L) 06/01/2020   K 4.4 06/01/2020   CL 98 06/01/2020   CREATININE 1.07 06/01/2020   BUN 11 06/01/2020   CO2 26 06/01/2020   TSH 6.583 (H) 06/01/2020  PSA 1.77 04/19/2018   INR 1.16 05/13/2015   HGBA1C 5.7 07/31/2015    DG Chest Port 1 View  Result Date: 06/01/2020 CLINICAL DATA:  Near syncopal episode. 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 EXAM: PORTABLE CHEST 1 VIEW COMPARISON:  06/13/2019; 04/17/2018; CT abdomen pelvis-05/29/2019; chest CT-12/15/2018 FINDINGS: Grossly unchanged cardiac silhouette and mediastinal contours. Improved aeration of the lungs. The lungs remain hyperexpanded with flattening the diaphragms and mild diffuse slightly nodular thickening pulmonary interstitium. Nodular opacity overlying the right lower lung compatible with known right lower lobe AVM demonstrated on chest CT performed 12/15/2018. No new focal airspace opacities. No pleural effusion or pneumothorax. No evidence of edema. No acute osseous abnormalities. IMPRESSION: 1. Improved aeration of the lungs with persistent findings of lung hyperexpansion and chronic bronchitic change. 2. Right lower lung nodular opacity compatible with known pulmonary AVM demonstrated on remote dedicated chest CT performed 11/2018. Electronically Signed   By: Sandi Mariscal M.D.   On: 06/01/2020 11:22    Assessment & Plan:   Acy was seen today for hypertension and hypothyroidism.  Diagnoses and all orders for this visit:  Acquired hypothyroidism- His recent TSH was mildly elevated at 6.58.  He is symptomatic with bradycardia and fatigue.  Will increase the dose of levothyroxine. -      levothyroxine (SYNTHROID) 137 MCG tablet; Take 1 tablet (137 mcg total) by mouth daily before breakfast.   I have discontinued [name] "[name]"'s gabapentin, levothyroxine, and metaxalone. I am also having him start on levothyroxine. Additionally, I am having him maintain his Magnesium, Vitamin D3, levocetirizine, metoprolol succinate, nystatin, silodosin, montelukast, clidinium-chlordiazePOXIDE, demeclocycline, pantoprazole, saw palmetto, Multi For Him 50+, acetaminophen, meclizine, and clorazepate.  Meds ordered this encounter  Medications  . levothyroxine (SYNTHROID) 137 MCG tablet    Sig: Take 1 tablet (137 mcg total) by mouth daily before breakfast.    Dispense:  90 tablet    Refill:  1     Follow-up: Return in about 6 months (around 12/09/2020).  Scarlette Calico, MD

## 2020-06-09 NOTE — Telephone Encounter (Signed)
Patient called stating he still has not received his monitor since last week.

## 2020-06-11 ENCOUNTER — Other Ambulatory Visit: Payer: Self-pay | Admitting: *Deleted

## 2020-06-11 DIAGNOSIS — R55 Syncope and collapse: Secondary | ICD-10-CM

## 2020-06-11 NOTE — Telephone Encounter (Signed)
Apologized to patient for the delay.  The order for his event monitor had accidentally been marked complete by staff member causing it not to drop to our work que.  I will re-enter an order and enroll him for [employer] to ship as soon as possible.

## 2020-06-17 ENCOUNTER — Ambulatory Visit (INDEPENDENT_AMBULATORY_CARE_PROVIDER_SITE_OTHER): Payer: Medicare Other

## 2020-06-17 DIAGNOSIS — R55 Syncope and collapse: Secondary | ICD-10-CM

## 2020-06-30 ENCOUNTER — Telehealth: Payer: Self-pay

## 2020-06-30 NOTE — Telephone Encounter (Signed)
Should pt be seen before coming off of demeclocycline

## 2020-06-30 NOTE — Telephone Encounter (Signed)
New message    Pt c/o medication issue:  1. Name of Medication: demeclocycline (DECLOMYCIN) 150 MG tablet  2. How are you currently taking this medication (dosage and times per day)? Twice a day   3. Are you having a reaction (difficulty breathing--STAT)? No   4. What is your medication issue? Wants to come off medication

## 2020-07-01 NOTE — Telephone Encounter (Signed)
F/u   Patient is informed of the message below to stop taking demeclocycline.

## 2020-07-01 NOTE — Telephone Encounter (Signed)
lvm for pt to call back.   If pt calls back you may informed of below:  Per PCP - pt can just stop taking the demeclocycline.

## 2020-08-05 NOTE — Progress Notes (Signed)
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  Admitted 5/16 with headache and right lower extremity weakness. CT of the head demonstrated a brain mass measuring 3 x 2 cm with midline shift and edema. This was a ring enhancing mass in the left basal ganglia.He underwent stereotactic biopsy by neurosurgery. He was diagnosed with a brain abscess. Notes from ID indicate his cultures grew out Peptostreptococcus sp. Patient was followed by infectious disease in the hospital and placed on vancomycin, Rocephin and Flagyl. This was to be continued for one month post discharge. He was also placed on Keppra for seizure prophylaxis. 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   Following DC, patient had an episode of elevated heart rate. EMS was called and he was noted to be in atrial flutter. This converted to sinus rhythm spontaneously. Patient placed on toprol. Event monitor showed slow atrial flutter versus ectopic atrial tachycardia.   Patient seen recently with syncope.  Event possibly vagally mediated.  Echocardiogram June 2021 showed vigorous LV function, mild left ventricular hypertrophy, grade 1 diastolic dysfunction, mild mitral regurgitation.  Monitor July 2021 showed sinus bradycardia, normal sinus rhythm and sinus tachycardia.  Since he was last seenhe has had no further syncopal episodes.  He denies dyspnea, chest pain or palpitations.  No pedal edema.  Current Outpatient Medications  Medication Sig Dispense Refill  . acetaminophen (TYLENOL) 325 MG tablet Take 650 mg by mouth every 6 (six) hours as needed for mild pain.    [name] amoxicillin (AMOXIL) 500 MG capsule SMARTSIG:2 Capsule(s) By Mouth PRN    . Cholecalciferol (VITAMIN D3) 2000 UNITS TABS Take 5,000 Units by mouth daily.     . clidinium-chlordiazePOXIDE (LIBRAX) 5-2.5 MG  capsule Take 1 capsule by mouth 3 (three) times daily before meals. (Patient taking differently: Take 1 capsule by mouth as needed (stomach cramps). ) 60 capsule 3  . clobetasol ointment (TEMOVATE) 0.05 % Apply topically 2 (two) times daily.    . clorazepate (TRANXENE) 7.5 MG tablet Take 7.5 mg by mouth at bedtime as needed for anxiety.    [name] levocetirizine (XYZAL) 5 MG tablet Take 1 tablet (5 mg total) by mouth every evening. 90 tablet 1  . levothyroxine (SYNTHROID) 137 MCG tablet Take 1 tablet (137 mcg total) by mouth daily before breakfast. 90 tablet 1  . Magnesium 250 MG TABS Take 250 mg by mouth at bedtime.     . meclizine (ANTIVERT) 25 MG tablet Take 25 mg by mouth 2 (two) times daily as needed for dizziness.    . methocarbamol (ROBAXIN) 500 MG tablet Take 500 mg by mouth every 6 (six) hours as needed.    . metoprolol succinate (TOPROL-XL) 25 MG 24 hr tablet TAKE 0.5  TABLET BY MOUTH  DAILY (Patient taking differently: Take 12.5 mg by mouth daily. TAKE 0.5  TABLET BY MOUTH  DAILY) 90 tablet 1  . montelukast (SINGULAIR) 10 MG tablet Take 1 tablet (10 mg total) by mouth at bedtime. 90 tablet 1  . Multiple Vitamins-Minerals (MULTI FOR HIM 50+) TABS Take 1 tablet by mouth daily.    [name] nystatin (MYCOSTATIN) 100000 UNIT/ML suspension Take 5 mLs by mouth 4 (four) times daily as needed (sore throat).     . pantoprazole (PROTONIX) 40 MG tablet TAKE 1 TABLET BY MOUTH  TWICE DAILY (Patient taking differently: Take 40 mg by mouth 2 (two)  times daily. ) 180 tablet 0  . saw palmetto 500 MG capsule Take 500 mg by mouth 2 (two) times daily.    . silodosin (RAPAFLO) 8 MG CAPS capsule TAKE ONE CAPSULE BY MOUTH DAILY AT BEDTIME (Patient taking differently: Take 8 mg by mouth at bedtime. TAKE ONE CAPSULE BY MOUTH DAILY AT BEDTIME ) 90 capsule 1   No current facility-administered medications for this visit.     Past Medical History:  Diagnosis Date  . Anxiety state, unspecified   . Arthritis   . Benign  neoplasm of colon   . GERD (gastroesophageal reflux disease)   . 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 History of nonmelanoma skin cancer EXCISION SQUAMOUS CELL FROM HAND  . Hypertrophy of prostate with urinary obstruction and other lower urinary tract symptoms (LUTS)   . Irritable bowel syndrome   . Lumbago   . Other diseases of nasal cavity and sinuses(478.19)   . Persistent disorder of initiating or maintaining sleep   . Plantar fascial fibromatosis   . Pure hypercholesterolemia   . 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 Unspecified hypothyroidism     Past Surgical History:  Procedure Laterality Date  . APPLICATION OF CRANIAL NAVIGATION N/A 05/16/2015   Procedure: APPLICATION OF CRANIAL NAVIGATION;  Surgeon: Consuella Lose, MD;  Location: [facility];  Service: Neurosurgery;  Laterality: N/A;  . BRAIN BIOPSY Left 05/16/2015   Procedure: Stereotactic Left Brain Biopsy with Brain Lab;  Surgeon: Consuella Lose, MD;  Location: [facility];  Service: Neurosurgery;  Laterality: Left;  Stereotactic Left Brain biopsy with brainlab  . BRAIN SURGERY    . CARDIOVASCULAR STRESS TEST  01-14-2003   NO ISCHEMIA / EF 61%/ NORMAL LE WALL MOTION  . EXCISION LEFT WRIST GANGLIAN/ MYXOID CYST  05-30-2009  . IR RADIOLOGIST EVAL & MGMT  02/01/2019  . 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 OTHER SURGICAL HISTORY     pulse laser for facial telangiectasias inthe past  . removal of skin cancer from forehead  10/2012   Dr. Renda Rolls  . TRANSTHORACIC ECHOCARDIOGRAM  10-17-2008   DR Jjesus Dingley   NORMAL LVF/ EF 60%/  MILDLY DILATED RIGHT ATRIUM/ VENTRICULE  . VARICOCELECTOMY  1991   Dr. Tresa Endo    Social History   Socioeconomic History  . Marital status: Married    Spouse name: [name]    . Number of children: 2  . Years of education: Not on file  . Highest education level: Master's degree (e.g., MA, MS, MEng, MEd, MSW, MBA)  Occupational History    Comment: Consultant  Tobacco Use  . Smoking status: Never Smoker  . Smokeless tobacco: Never Used  Vaping Use  . Vaping Use: Never used  Substance and Sexual Activity  . Alcohol use: No    Alcohol/week: 0.0 standard drinks  . Drug use: No  . Sexual activity: Not on file  Other Topics Concern  . Not on file  Social History Narrative   Lives with wife in a one story home.  Has 2 sons.  Retired Veterinary surgeon.  Education: Masters degree.      05/09/19- Pt states that his balance is worse since last visit. He has not fallen but has come close on a few occassions. The  weakness on his right side is the same, he states.   Social Determinants of Health   Financial Resource Strain:   . Difficulty of Paying Living Expenses: Not on file  Food Insecurity:   . Worried About Charity fundraiser in the Last Year: Not on file  . Ran Out of Food in the Last Year: Not on file  Transportation Needs:   . Lack of Transportation (Medical): Not on file  . Lack of Transportation (Non-Medical): Not on file  Physical Activity:   . Days of Exercise per Week: Not on file  . Minutes of Exercise per Session: Not on file  Stress:   . Feeling of Stress : Not on file  Social Connections:   . Frequency of Communication with Friends and Family: Not on file  . Frequency of Social Gatherings with Friends and Family: Not on file  . Attends Religious Services: Not on file  . Active Member of Clubs or Organizations: Not on file  . Attends Archivist Meetings: Not on file  . Marital Status: Not on file  Intimate Partner Violence:   . Fear of Current or Ex-Partner: Not on file  . Emotionally Abused: Not on file  . Physically Abused: Not on file  . Sexually Abused: Not on file    Family History  Problem Relation Age of Onset  .  Diabetes Mother   . Hypertension Mother   . Pancreatic cancer Mother   . Stroke Mother   . Kidney failure Father   . Hypertension Sister   . Healthy Son     ROS: no fevers or chills, productive cough, hemoptysis, dysphasia, odynophagia, melena, hematochezia, dysuria, hematuria, rash, seizure activity, orthopnea, PND, pedal edema, claudication. Remaining systems are negative.  Physical Exam: Well-developed well-nourished in no acute distress.  Skin is warm and dry.  HEENT is normal.  Neck is supple.  Chest is clear to auscultation with normal expansion.  Cardiovascular exam is regular rate and rhythm.  Abdominal exam nontender or distended. No masses palpated. Extremities show no edema. neuro grossly intact  A/P  1 history of syncope-LV function normal.  Follow-up monitor unremarkable.  Episode may have been vagal in etiology though duration seems longer than expected.  We can consider an implantable loop monitor in the future if he has recurrences.  Patient instructed not to drive for 6 months following most recent episode.  2 history of atrial flutter versus ectopic atrial tachycardia-patient remains in sinus rhythm on examination.  Continue low-dose Toprol.  As outlined in previous notes we elected not to anticoagulate given history of Osler Weber Rendu with AV malformations/GI bleeding and prior brain abscess.  3 hyperlipidemia-Per primary care.  4 history of orthostasis-we discussed the importance of maintaining hydration.  He is not symptomatic at this point.  Kirk Ruths, MD

## 2020-08-11 ENCOUNTER — Encounter: Payer: Self-pay | Admitting: Cardiology

## 2020-08-11 ENCOUNTER — Ambulatory Visit (INDEPENDENT_AMBULATORY_CARE_PROVIDER_SITE_OTHER): Payer: Medicare Other | Admitting: Cardiology

## 2020-08-11 ENCOUNTER — Other Ambulatory Visit: Payer: Self-pay

## 2020-08-11 VITALS — BP 140/82 | HR 63 | Temp 97.7°F | Ht 70.0 in | Wt 189.4 lb

## 2020-08-11 DIAGNOSIS — R55 Syncope and collapse: Secondary | ICD-10-CM | POA: Diagnosis not present

## 2020-08-11 DIAGNOSIS — I483 Typical atrial flutter: Secondary | ICD-10-CM | POA: Diagnosis not present

## 2020-08-11 DIAGNOSIS — I951 Orthostatic hypotension: Secondary | ICD-10-CM | POA: Diagnosis not present

## 2020-08-11 DIAGNOSIS — E7800 Pure hypercholesterolemia, unspecified: Secondary | ICD-10-CM | POA: Diagnosis not present

## 2020-08-11 NOTE — Patient Instructions (Signed)
Medication Instructions:  NO CHANGE *If you need a refill on your cardiac medications before your next appointment, please call your pharmacy*   Lab Work: If you have labs (blood work) drawn today and your tests are completely normal, you will receive your results only by: [name] MyChart Message (if you have MyChart) OR . A paper copy in the mail If you have any lab test that is abnormal or we need to change your treatment, we will call you to review the results.   Follow-Up: At [facility], you and your health needs are our priority.  As part of our continuing mission to provide you with exceptional heart care, we have created designated Provider Care Teams.  These Care Teams include your primary Cardiologist (physician) and Advanced Practice Providers (APPs -  Physician Assistants and Nurse Practitioners) who all work together to provide you with the care you need, when you need it.  We recommend signing up for the patient portal called "MyChart".  Sign up information is provided on this After Visit Summary.  MyChart is used to connect with patients for Virtual Visits (Telemedicine).  Patients are able to view lab/test results, encounter notes, upcoming appointments, etc.  Non-urgent messages can be sent to your provider as well.   To learn more about what you can do with MyChart, go to [email or web address].    Your next appointment:   4 month(s)  The format for your next appointment:   In Person  Provider:   You may see Kirk Ruths, MD or one of the following Advanced Practice Providers on your designated Care Team:    Kerin Ransom, PA-C  [place], [place]  Coletta Memos, [place]

## 2020-08-22 ENCOUNTER — Telehealth: Payer: Self-pay | Admitting: Internal Medicine

## 2020-08-22 DIAGNOSIS — R739 Hyperglycemia, unspecified: Secondary | ICD-10-CM

## 2020-08-22 NOTE — Progress Notes (Signed)
°  Chronic Care Management   Outreach Note  08/22/2020 Name: Gregory [name] MRN: [id number] DOB: 1941-02-28  Referred by: Janith Lima, MD Reason for referral : No chief complaint on file.   An unsuccessful telephone outreach was attempted today. The patient was referred to the pharmacist for assistance with care management and care coordination.   Follow Up Plan:   Earney Hamburg Upstream Scheduler

## 2020-08-22 NOTE — Progress Notes (Signed)
°  Chronic Care Management   Note  08/22/2020 Name: Gregory [name] MRN: [id number] DOB: [birth year]  Gregory [name] is a 79 y.o. year old male who is a primary care patient of Janith Lima, MD. I reached out to [name] by phone today in response to a referral sent by Gregory [name]'s PCP, Janith Lima, MD.   Gregory [name] was given information about Chronic Care Management services today including:  1. CCM service includes personalized support from designated clinical staff supervised by his physician, including individualized plan of care and coordination with other care providers 2. 24/7 contact phone numbers for assistance for urgent and routine care needs. 3. Service will only be billed when office clinical staff spend 20 minutes or more in a month to coordinate care. 4. Only one practitioner may furnish and bill the service in a calendar month. 5. The patient may stop CCM services at any time (effective at the end of the month) by phone call to the office staff.   Patient agreed to services and verbal consent obtained.   Follow up plan:   Gregory [name] Upstream Scheduler

## 2020-09-02 ENCOUNTER — Other Ambulatory Visit: Payer: Self-pay | Admitting: Internal Medicine

## 2020-09-02 DIAGNOSIS — J4530 Mild persistent asthma, uncomplicated: Secondary | ICD-10-CM

## 2020-09-02 MED ORDER — MONTELUKAST SODIUM 10 MG PO TABS: 10.0000 mg | ORAL_TABLET | Freq: Every day | ORAL | 1 refills | Status: DC

## 2020-09-28 ENCOUNTER — Other Ambulatory Visit: Payer: Self-pay | Admitting: Family

## 2020-09-28 DIAGNOSIS — K219 Gastro-esophageal reflux disease without esophagitis: Secondary | ICD-10-CM

## 2020-10-02 ENCOUNTER — Other Ambulatory Visit: Payer: Self-pay

## 2020-10-02 ENCOUNTER — Ambulatory Visit (INDEPENDENT_AMBULATORY_CARE_PROVIDER_SITE_OTHER): Payer: Medicare Other | Admitting: *Deleted

## 2020-10-02 DIAGNOSIS — Z23 Encounter for immunization: Secondary | ICD-10-CM | POA: Diagnosis not present

## 2020-10-13 ENCOUNTER — Other Ambulatory Visit: Payer: Self-pay | Admitting: Internal Medicine

## 2020-10-13 DIAGNOSIS — E039 Hypothyroidism, unspecified: Secondary | ICD-10-CM

## 2020-10-17 ENCOUNTER — Other Ambulatory Visit: Payer: Self-pay | Admitting: Internal Medicine

## 2020-10-17 DIAGNOSIS — N138 Other obstructive and reflux uropathy: Secondary | ICD-10-CM

## 2020-10-18 ENCOUNTER — Ambulatory Visit: Payer: Medicare Other | Attending: Internal Medicine

## 2020-10-18 DIAGNOSIS — Z23 Encounter for immunization: Secondary | ICD-10-CM

## 2020-10-18 NOTE — Progress Notes (Signed)
   Covid-19 Vaccination Clinic  Name:  Gregory [name]    MRN: [id number] DOB: 03/31/1941  10/18/2020  [name] was observed post Covid-19 immunization for 15 minutes without incident. He was provided with Vaccine Information Sheet and instruction to access the V-Safe system.   [name] was instructed to call 911 with any severe reactions post vaccine: [name] Difficulty breathing  . Swelling of face and throat  . A fast heartbeat  . A bad rash all over body  . Dizziness and weakness

## 2020-10-23 ENCOUNTER — Other Ambulatory Visit: Payer: Self-pay

## 2020-10-23 ENCOUNTER — Ambulatory Visit (INDEPENDENT_AMBULATORY_CARE_PROVIDER_SITE_OTHER): Payer: Medicare Other

## 2020-10-23 ENCOUNTER — Ambulatory Visit (INDEPENDENT_AMBULATORY_CARE_PROVIDER_SITE_OTHER): Payer: Medicare Other | Admitting: Internal Medicine

## 2020-10-23 ENCOUNTER — Encounter: Payer: Self-pay | Admitting: Internal Medicine

## 2020-10-23 VITALS — BP 130/70 | HR 63 | Temp 97.7°F | Resp 16 | Ht 70.0 in | Wt 185.0 lb

## 2020-10-23 DIAGNOSIS — R059 Cough, unspecified: Secondary | ICD-10-CM | POA: Diagnosis not present

## 2020-10-23 DIAGNOSIS — I700 Atherosclerosis of aorta: Secondary | ICD-10-CM | POA: Diagnosis not present

## 2020-10-23 DIAGNOSIS — N138 Other obstructive and reflux uropathy: Secondary | ICD-10-CM

## 2020-10-23 DIAGNOSIS — I483 Typical atrial flutter: Secondary | ICD-10-CM | POA: Diagnosis not present

## 2020-10-23 DIAGNOSIS — E222 Syndrome of inappropriate secretion of antidiuretic hormone: Secondary | ICD-10-CM

## 2020-10-23 DIAGNOSIS — E039 Hypothyroidism, unspecified: Secondary | ICD-10-CM | POA: Diagnosis not present

## 2020-10-23 DIAGNOSIS — D500 Iron deficiency anemia secondary to blood loss (chronic): Secondary | ICD-10-CM

## 2020-10-23 DIAGNOSIS — R739 Hyperglycemia, unspecified: Secondary | ICD-10-CM | POA: Diagnosis not present

## 2020-10-23 DIAGNOSIS — K580 Irritable bowel syndrome with diarrhea: Secondary | ICD-10-CM

## 2020-10-23 DIAGNOSIS — K219 Gastro-esophageal reflux disease without esophagitis: Secondary | ICD-10-CM | POA: Diagnosis not present

## 2020-10-23 DIAGNOSIS — R7303 Prediabetes: Secondary | ICD-10-CM | POA: Insufficient documentation

## 2020-10-23 DIAGNOSIS — J4530 Mild persistent asthma, uncomplicated: Secondary | ICD-10-CM

## 2020-10-23 DIAGNOSIS — R051 Acute cough: Secondary | ICD-10-CM | POA: Insufficient documentation

## 2020-10-23 DIAGNOSIS — Z298 Encounter for other specified prophylactic measures: Secondary | ICD-10-CM | POA: Diagnosis not present

## 2020-10-23 DIAGNOSIS — N401 Enlarged prostate with lower urinary tract symptoms: Secondary | ICD-10-CM

## 2020-10-23 LAB — TSH: TSH: 1.41 u[IU]/mL (ref 0.35–4.50)

## 2020-10-23 LAB — BASIC METABOLIC PANEL
BUN: 15 mg/dL (ref 6–23)
CO2: 30 mEq/L (ref 19–32)
Calcium: 9.2 mg/dL (ref 8.4–10.5)
Chloride: 94 mEq/L — ABNORMAL LOW (ref 96–112)
Creatinine, Ser: 0.99 mg/dL (ref 0.40–1.50)
GFR: 73.22 mL/min (ref >60.00)
Glucose, Bld: 87 mg/dL (ref 70–99)
Potassium: 4.5 mEq/L (ref 3.5–5.1)
Sodium: 128 mEq/L — ABNORMAL LOW (ref 135–145)

## 2020-10-23 LAB — CBC WITH DIFFERENTIAL/PLATELET
Basophils Absolute: 0 10*3/uL (ref 0.0–0.1)
Basophils Relative: 0.8 % (ref 0.0–3.0)
Eosinophils Absolute: 0.1 10*3/uL (ref 0.0–0.7)
Eosinophils Relative: 1.6 % (ref 0.0–5.0)
HCT: 41.5 % (ref 39.0–52.0)
Hemoglobin: 13.8 g/dL (ref 13.0–17.0)
Lymphocytes Relative: 39.3 % (ref 12.0–46.0)
Lymphs Abs: 2.3 10*3/uL (ref 0.7–4.0)
MCHC: 33.2 g/dL (ref 30.0–36.0)
MCV: 82.8 fl (ref 78.0–100.0)
Monocytes Absolute: 0.7 10*3/uL (ref 0.1–1.0)
Monocytes Relative: 13 % — ABNORMAL HIGH (ref 3.0–12.0)
Neutro Abs: 2.6 10*3/uL (ref 1.4–7.7)
Neutrophils Relative %: 45.3 % (ref 43.0–77.0)
Platelets: 300 10*3/uL (ref 150.0–400.0)
RBC: 5.01 Mil/uL (ref 4.22–5.81)
RDW: 14.2 % (ref 11.5–15.5)
WBC: 5.7 10*3/uL (ref 4.0–10.5)

## 2020-10-23 LAB — IRON: Iron: 43 ug/dL (ref 42–165)

## 2020-10-23 LAB — LIPID PANEL
Cholesterol: 140 mg/dL (ref 0–200)
HDL: 46.3 mg/dL (ref >39.00)
LDL Cholesterol: 81 mg/dL (ref 0–99)
NonHDL: 93.2
Total CHOL/HDL Ratio: 3
Triglycerides: 62 mg/dL (ref 0.0–149.0)
VLDL: 12.4 mg/dL (ref 0.0–40.0)

## 2020-10-23 LAB — FERRITIN: Ferritin: 10.3 ng/mL — ABNORMAL LOW (ref 22.0–322.0)

## 2020-10-23 LAB — HEMOGLOBIN A1C: Hgb A1c MFr Bld: 6.3 % (ref 4.6–6.5)

## 2020-10-23 MED ORDER — SILODOSIN 8 MG PO CAPS: 8.0000 mg | ORAL_CAPSULE | Freq: Every day | ORAL | 1 refills | Status: DC

## 2020-10-23 MED ORDER — LEVOTHYROXINE SODIUM 125 MCG PO TABS: 125.0000 ug | ORAL_TABLET | Freq: Every day | ORAL | 1 refills | Status: DC

## 2020-10-23 MED ORDER — AMOXICILLIN 500 MG PO CAPS: ORAL_CAPSULE | ORAL | 3 refills | Status: DC

## 2020-10-23 MED ORDER — METOPROLOL SUCCINATE ER 25 MG PO TB24: 12.5000 mg | ORAL_TABLET | Freq: Every day | ORAL | 1 refills | Status: DC

## 2020-10-23 MED ORDER — CILIDINIUM-CHLORDIAZEPOXIDE 2.5-5 MG PO CAPS: 1.0000 | ORAL_CAPSULE | Freq: Three times a day (TID) | ORAL | 3 refills | Status: DC

## 2020-10-23 MED ORDER — MONTELUKAST SODIUM 10 MG PO TABS: 10.0000 mg | ORAL_TABLET | Freq: Every day | ORAL | 1 refills | Status: DC

## 2020-10-23 MED ORDER — PANTOPRAZOLE SODIUM 40 MG PO TBEC: 40.0000 mg | DELAYED_RELEASE_TABLET | Freq: Two times a day (BID) | ORAL | 0 refills | Status: DC

## 2020-10-23 NOTE — Patient Instructions (Signed)

## 2020-10-23 NOTE — Progress Notes (Signed)
Subjective:  Patient ID: Gregory [name], male    DOB: 11/23/1941  Age: 79 y.o. MRN: [id number]  CC: Cough  This visit occurred during the SARS-CoV-2 public health emergency.  Safety protocols were in place, including screening questions prior to the visit, additional usage of staff PPE, and extensive cleaning of exam room while observing appropriate contact time as indicated for disinfecting solutions.    HPI VENSON FERENCZ presents for f/up - He has a chronic cough but complains that for the last 4 months the cough has worsened - he has developed SOB and the cough is more frequent and now productive of clear phlegm. He denies hemoptysis, CP, or diaphoresis.   Outpatient Medications Prior to Visit  Medication Sig Dispense Refill   acetaminophen (TYLENOL) 325 MG tablet Take 650 mg by mouth every 6 (six) hours as needed for mild pain.     Cholecalciferol (VITAMIN D3) 2000 UNITS TABS Take 5,000 Units by mouth daily.      clobetasol ointment (TEMOVATE) 0.05 % Apply topically 2 (two) times daily.     levocetirizine (XYZAL) 5 MG tablet Take 1 tablet (5 mg total) by mouth every evening. 90 tablet 1   Magnesium 250 MG TABS Take 250 mg by mouth at bedtime.      meclizine (ANTIVERT) 25 MG tablet Take 25 mg by mouth 2 (two) times daily as needed for dizziness.     methocarbamol (ROBAXIN) 500 MG tablet Take 500 mg by mouth every 6 (six) hours as needed.     Multiple Vitamins-Minerals (MULTI FOR HIM 50+) TABS Take 1 tablet by mouth daily.     nystatin (MYCOSTATIN) 100000 UNIT/ML suspension Take 5 mLs by mouth 4 (four) times daily as needed (sore throat).      saw palmetto 500 MG capsule Take 500 mg by mouth 2 (two) times daily.     amoxicillin (AMOXIL) 500 MG capsule SMARTSIG:2 Capsule(s) By Mouth PRN     clidinium-chlordiazePOXIDE (LIBRAX) 5-2.5 MG capsule Take 1 capsule by mouth 3 (three) times daily before meals. (Patient taking differently: Take 1 capsule by mouth as needed (stomach  cramps). ) 60 capsule 3   clorazepate (TRANXENE) 7.5 MG tablet Take 7.5 mg by mouth at bedtime as needed for anxiety.     levothyroxine (SYNTHROID) 137 MCG tablet TAKE 1 TABLET BY MOUTH  DAILY BEFORE BREAKFAST 90 tablet 0   metoprolol succinate (TOPROL-XL) 25 MG 24 hr tablet TAKE 0.5  TABLET BY MOUTH  DAILY (Patient taking differently: Take 12.5 mg by mouth daily. TAKE 0.5  TABLET BY MOUTH  DAILY) 90 tablet 1   montelukast (SINGULAIR) 10 MG tablet Take 1 tablet (10 mg total) by mouth at bedtime. 90 tablet 1   pantoprazole (PROTONIX) 40 MG tablet Take 1 tablet (40 mg total) by mouth 2 (two) times daily. 180 tablet 0   silodosin (RAPAFLO) 8 MG CAPS capsule Take 1 capsule (8 mg total) by mouth at bedtime. TAKE ONE CAPSULE BY MOUTH DAILY AT BEDTIME 90 capsule 1   No facility-administered medications prior to visit.    ROS Review of Systems  Constitutional: Negative for appetite change, chills, diaphoresis, fatigue and fever.  HENT: Negative.  Negative for sore throat and trouble swallowing.   Eyes: Negative.   Respiratory: Positive for cough and shortness of breath. Negative for wheezing.   Cardiovascular: Negative for chest pain, palpitations and leg swelling.  Gastrointestinal: Positive for constipation. Negative for abdominal pain, blood in stool, diarrhea, nausea and vomiting.  Endocrine: Negative.   Genitourinary: Negative.  Negative for difficulty urinating.  Musculoskeletal: Positive for arthralgias. Negative for back pain and myalgias.  Skin: Negative.  Negative for color change.  Neurological: Negative.  Negative for dizziness, tremors, seizures, weakness, light-headedness and headaches.  Hematological: Negative for adenopathy. Does not bruise/bleed easily.  Psychiatric/Behavioral: Negative.     Objective:  BP 130/70    Pulse 63    Temp 97.7 F (36.5 C) (Oral)    Resp 16    Ht 5\' 10"  (1.778 m)    Wt 185 lb (83.9 kg)    SpO2 93%    BMI 26.54 kg/m   BP Readings from Last 3  Encounters:  10/23/20 130/70  08/11/20 140/82  06/09/20 (!) 148/76    Wt Readings from Last 3 Encounters:  10/23/20 185 lb (83.9 kg)  08/11/20 189 lb 6.4 oz (85.9 kg)  06/09/20 188 lb 2 oz (85.3 kg)    Physical Exam Vitals reviewed.  Constitutional:      Appearance: Normal appearance.  HENT:     Nose: Nose normal.     Mouth/Throat:     Mouth: Mucous membranes are moist.  Eyes:     General: No scleral icterus.    Conjunctiva/sclera: Conjunctivae normal.  Cardiovascular:     Rate and Rhythm: Normal rate and regular rhythm.     Heart sounds: No murmur heard.   Pulmonary:     Effort: Pulmonary effort is normal.     Breath sounds: No stridor. No wheezing, rhonchi or rales.  Abdominal:     General: Abdomen is flat.     Palpations: There is no mass.     Tenderness: There is no abdominal tenderness. There is no guarding.     Hernia: No hernia is present.  Musculoskeletal:        General: Normal range of motion.     Cervical back: Neck supple.     Right lower leg: No edema.     Left lower leg: No edema.  Lymphadenopathy:     Cervical: No cervical adenopathy.  Skin:    General: Skin is warm and dry.     Findings: No lesion.  Neurological:     General: No focal deficit present.     Mental Status: He is alert.  Psychiatric:        Mood and Affect: Mood normal.        Behavior: Behavior normal.     Lab Results  Component Value Date   WBC 5.7 10/23/2020   HGB 13.8 10/23/2020   HCT 41.5 10/23/2020   PLT 300.0 10/23/2020   GLUCOSE 87 10/23/2020   CHOL 140 10/23/2020   TRIG 62.0 10/23/2020   HDL 46.30 10/23/2020   LDLDIRECT 139.4 06/15/2007   LDLCALC 81 10/23/2020   ALT 12 04/24/2019   AST 15 04/24/2019   NA 128 (L) 10/23/2020   K 4.5 10/23/2020   CL 94 (L) 10/23/2020   CREATININE 0.99 10/23/2020   BUN 15 10/23/2020   CO2 30 10/23/2020   TSH 1.41 10/23/2020   PSA 1.77 04/19/2018   INR 1.16 05/13/2015   HGBA1C 6.3 10/23/2020    DG Chest Port 1  View  Result Date: 06/01/2020 CLINICAL DATA:  Near syncopal episode. 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 EXAM: PORTABLE CHEST 1 VIEW COMPARISON:  06/13/2019; 04/17/2018; CT abdomen pelvis-05/29/2019; chest CT-12/15/2018 FINDINGS: Grossly unchanged cardiac silhouette and mediastinal contours. Improved aeration of the lungs. The lungs remain hyperexpanded with flattening  the diaphragms and mild diffuse slightly nodular thickening pulmonary interstitium. Nodular opacity overlying the right lower lung compatible with known right lower lobe AVM demonstrated on chest CT performed 12/15/2018. No new focal airspace opacities. No pleural effusion or pneumothorax. No evidence of edema. No acute osseous abnormalities. IMPRESSION: 1. Improved aeration of the lungs with persistent findings of lung hyperexpansion and chronic bronchitic change. 2. Right lower lung nodular opacity compatible with known pulmonary AVM demonstrated on remote dedicated chest CT performed 11/2018. Electronically Signed   By: Sandi Mariscal M.D.   On: 06/01/2020 11:22   DG Chest 2 View  Result Date: 10/23/2020 CLINICAL DATA:  Cough for the past 2 years. EXAM: CHEST - 2 VIEW COMPARISON:  06/01/2020 FINDINGS: Previously noted pulmonary AVM at the medial right lung base. Biapical pleural and parenchymal scarring. Otherwise, clear lungs. Normal sized heart. Unremarkable bones. IMPRESSION: No acute abnormality. Electronically Signed   By: Claudie Revering M.D.   On: 10/23/2020 10:51     Assessment & Plan:   Gaspar was seen today for cough.  Diagnoses and all orders for this visit:  SBE (subacute bacterial endocarditis) prophylaxis candidate -     Discontinue: amoxicillin (AMOXIL) 500 MG capsule; SMARTSIG:4 Capsule(s) By Mouth PRN -     amoxicillin (AMOXIL) 500 MG capsule; SMARTSIG:4 Capsule(s) By Mouth PRN  Acquired hypothyroidism- His TSH is too low for his age. I recommended a slight decrease in the T 4 dose. -     TSH;  Future -     TSH -     levothyroxine (SYNTHROID) 125 MCG tablet; Take 1 tablet (125 mcg total) by mouth daily before breakfast.  Typical atrial flutter ([place])- He is maintaining good rate/rhythm control. Will continue the current dose of metoprolol. Anticoagulation is contraindicated. -     metoprolol succinate (TOPROL-XL) 25 MG 24 hr tablet; Take 0.5 tablets (12.5 mg total) by mouth daily. -     TSH; Future -     TSH  Laryngopharyngeal reflux (LPR) -     pantoprazole (PROTONIX) 40 MG tablet; Take 1 tablet (40 mg total) by mouth 2 (two) times daily. 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 -     montelukast (SINGULAIR) 10 MG tablet; Take 1 tablet (10 mg total) by mouth at bedtime. -     Ambulatory referral to Pulmonology  Benign prostatic hyperplasia with urinary obstruction -     Discontinue: silodosin (RAPAFLO) 8 MG CAPS capsule; Take 1 capsule (8 mg total) by mouth at bedtime. TAKE ONE CAPSULE BY MOUTH DAILY AT BEDTIME -     silodosin (RAPAFLO) 8 MG CAPS capsule; Take 1 capsule (8 mg total) by mouth at bedtime. TAKE ONE CAPSULE BY MOUTH DAILY AT BEDTIME  Irritable bowel syndrome with diarrhea -     Discontinue: clidinium-chlordiazePOXIDE (LIBRAX) 5-2.5 MG capsule; Take 1 capsule by mouth 3 (three) times daily before meals. -     clidinium-chlordiazePOXIDE (LIBRAX) 5-2.5 MG capsule; Take 1 capsule by mouth 3 (three) times daily before meals.  Atherosclerosis of aorta ([place])- Will cont to address risk factor modifications. -     Lipid panel; Future -     Lipid panel  SIADH (syndrome of inappropriate ADH production) ([place])- His Na+ level is stable and he is asx. -  Basic metabolic panel; Future -     Basic metabolic panel  Iron deficiency anemia due to chronic blood loss- His H/H  are normal now. -     CBC with Differential/Platelet; Future -     Iron; Future -     Ferritin; Future -     Ferritin -     Iron -     CBC with Differential/Platelet  Hyperglycemia- His A1C is at 6.3%. medical therapy is not indicated. -     Basic metabolic panel; Future -     Hemoglobin A1c; Future -     Hemoglobin A1c -     Basic metabolic panel  Cough- His CXR is normal but he has worsening sxs. I have asked him to f/up with pulmonology. -     DG Chest 2 View; Future -     Ambulatory referral to Pulmonology   I have discontinued [name] "[name]"'s clorazepate and levothyroxine. I have also changed his metoprolol succinate. Additionally, I am having him start on levothyroxine. Lastly, I am having him maintain his Magnesium, Vitamin D3, levocetirizine, nystatin, saw palmetto, Multi For Him 50+, acetaminophen, meclizine, clobetasol ointment, methocarbamol, pantoprazole, montelukast, amoxicillin, silodosin, and clidinium-chlordiazePOXIDE.  Meds ordered this encounter  Medications   metoprolol succinate (TOPROL-XL) 25 MG 24 hr tablet    Sig: Take 0.5 tablets (12.5 mg total) by mouth daily.    Dispense:  45 tablet    Refill:  1   pantoprazole (PROTONIX) 40 MG tablet    Sig: Take 1 tablet (40 mg total) by mouth 2 (two) times daily.    Dispense:  180 tablet    Refill:  0   DISCONTD: montelukast (SINGULAIR) 10 MG tablet    Sig: Take 1 tablet (10 mg total) by mouth at bedtime.    Dispense:  90 tablet    Refill:  1   DISCONTD: silodosin (RAPAFLO) 8 MG CAPS capsule    Sig: Take 1 capsule (8 mg total) by mouth at bedtime. TAKE ONE CAPSULE BY MOUTH DAILY AT BEDTIME    Dispense:  90 capsule    Refill:  1   DISCONTD: amoxicillin (AMOXIL) 500 MG capsule    Sig: SMARTSIG:4 Capsule(s) By Mouth PRN    Dispense:  4 capsule    Refill:  3   DISCONTD: clidinium-chlordiazePOXIDE (LIBRAX) 5-2.5 MG capsule    Sig: Take 1 capsule by mouth 3 (three) times daily before meals.     Dispense:  90 capsule    Refill:  3   montelukast (SINGULAIR) 10 MG tablet    Sig: Take 1 tablet (10 mg total) by mouth at bedtime.    Dispense:  90 tablet    Refill:  1   amoxicillin (AMOXIL) 500 MG capsule    Sig: SMARTSIG:4 Capsule(s) By Mouth PRN    Dispense:  4 capsule    Refill:  3   silodosin (RAPAFLO) 8 MG CAPS capsule    Sig: Take 1 capsule (8 mg total) by mouth at bedtime. TAKE ONE CAPSULE BY MOUTH DAILY AT BEDTIME    Dispense:  90 capsule    Refill:  1   clidinium-chlordiazePOXIDE (LIBRAX) 5-2.5 MG capsule    Sig: Take 1 capsule by mouth 3 (three) times daily before meals.    Dispense:  90 capsule    Refill:  3   levothyroxine (SYNTHROID) 125 MCG tablet    Sig: Take 1 tablet (125 mcg total) by mouth daily before breakfast.    Dispense:  90 tablet    Refill:  1   I spent 50 minutes in preparing to see the patient by review of recent labs, imaging and procedures, obtaining and reviewing separately obtained history, communicating with the patient and family or caregiver, ordering medications, tests or procedures, and documenting clinical information in the EHR including the differential Dx, treatment, and any further evaluation and other management of 1. Acquired hypothyroidism 2. Typical atrial flutter ([facility]) 3. Laryngopharyngeal reflux (LPR) 4. 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 Benign prostatic hyperplasia with urinary obstruction 6. Irritable bowel syndrome with diarrhea 7. SBE (subacute bacterial endocarditis) prophylaxis candidate 8. Atherosclerosis of aorta ([place]) 9. SIADH (syndrome of inappropriate ADH production) ([place]) 10. Iron deficiency anemia due to chronic blood loss 11. Hyperglycemia 12. Cough     Follow-up: Return if symptoms worsen or fail to improve.  Scarlette Calico, MD

## 2020-11-24 ENCOUNTER — Ambulatory Visit: Payer: Medicare Other | Admitting: Pharmacist

## 2020-11-24 ENCOUNTER — Other Ambulatory Visit: Payer: Self-pay

## 2020-11-24 DIAGNOSIS — I483 Typical atrial flutter: Secondary | ICD-10-CM

## 2020-11-24 DIAGNOSIS — E039 Hypothyroidism, unspecified: Secondary | ICD-10-CM

## 2020-11-24 DIAGNOSIS — R059 Cough, unspecified: Secondary | ICD-10-CM

## 2020-11-24 DIAGNOSIS — E7800 Pure hypercholesterolemia, unspecified: Secondary | ICD-10-CM

## 2020-11-24 NOTE — Patient Instructions (Addendum)
Visit Information  Phone number for Pharmacist: [phone]  Thank you for meeting with me to discuss your medications! I look forward to working with you to achieve your health care goals. Below is a summary of what we talked about during the visit:  Goals Addressed            This Visit's Progress   . Pharmacy Care Plan       CARE PLAN ENTRY (see longitudinal plan of care for additional care plan information)  Current Barriers:  . Chronic Disease Management support, education, and care coordination needs related to Hypertension and Hyperlipidemia, hypothyroidism, cough   Hypertension / Atrial flutter BP Readings from Last 3 Encounters:  10/23/20 130/70  08/11/20 140/82  06/09/20 (!) 148/76 .  Pharmacist Clinical Goal(s): o Over the next 180 days, patient will work with PharmD and providers to maintain BP goal <130/80 . Current regimen:  o Metoprolol succinate 25 mg - 1/2 tablet daily . Interventions: o Discussed BP goals and benefits of medications for prevention of heart attack / stroke . Patient self care activities - Over the next 180 days, patient will: o Check BP as needed, document, and provide at future appointments o Ensure daily salt intake < 2300 mg/day  Hyperlipidemia Lab Results  Component Value Date/Time   LDLCALC 81 10/23/2020 10:50 AM   LDLDIRECT 139.4 06/15/2007 09:46 AM .  Pharmacist Clinical Goal(s): o Over the next 180 days, patient will work with PharmD and providers to maintain LDL goal < 100 . Current regimen:  o No medication . Interventions: o Discussed cholesterol goals and benefits of diet/exercise for prevention of heart attack / stroke . Patient self care activities - Over the next 180 days, patient will: o Continue low cholesterol diet and exercise routine  Hypothyroidism . Pharmacist Clinical Goal(s) o Over the next 180 days, patient will work with PharmD and providers to optimize therapy . Current regimen:  o Levothyroxine 137 mcg  daily . Interventions: o Patient was meant to reduce dose of levothyroxine to 125 mcg last month after PCP visit but did not get the message. Advised to restart 125 mcg dose starting tomorrow . Patient self care activities - Over the next 180 days, patient will: o Restart levothyroxine 125 mcg dose as directed by PCP  Cough . Pharmacist Clinical Goal(s) o Over the next 180 days, patient will work with PharmD and providers to optimize therapy . Current regimen:  o Delsym / Robitussin as needed . Interventions: o Discussed Delsym and Robitussin have the same active ingredient, dextromethorphan. Advised to alternate, never take together o Advised to discuss restarting low-dose gabapentin with prescriber (ENT) o Discussed referral to pulmonary has been made . Patient self care activities - Over the next 180 days, patient will: o Alternate Delsym / Robitussin o Discuss gabapentin with ENT o Schedule appointment with Pulmonology when contacted  Medication management . Pharmacist Clinical Goal(s): o Over the next 180 days, patient will work with PharmD and providers to maintain optimal medication adherence . Current pharmacy: [facility] . Interventions o Comprehensive medication review performed. o Continue current medication management strategy . Patient self care activities - Over the next 180 days, patient will: o Focus on medication adherence by pill box o Take medications as prescribed o Report any questions or concerns to PharmD and/or provider(s)  Initial goal documentation      Gregory [name] was given information about Chronic Care Management services today including:  1. CCM service includes  personalized support from designated clinical staff supervised by his physician, including individualized plan of care and coordination with other care providers 2. 24/7 contact phone numbers for assistance for urgent and routine care needs. 3. Standard insurance,  coinsurance, copays and deductibles apply for chronic care management only during months in which we provide at least 20 minutes of these services. Most insurances cover these services at 100%, however patients may be responsible for any copay, coinsurance and/or deductible if applicable. This service may help you avoid the need for more expensive face-to-face services. 4. Only one practitioner may furnish and bill the service in a calendar month. 5. The patient may stop CCM services at any time (effective at the end of the month) by phone call to the office staff.  Patient agreed to services and verbal consent obtained.   The patient verbalized understanding of instructions, educational materials, and care plan provided today and agreed to receive a mailed copy of patient instructions, educational materials, and care plan.  Telephone follow up appointment with pharmacy team member scheduled for: 6 months  Charlene Brooke, PharmD, BCACP Clinical Pharmacist [facility] [place] After Age 44 After age 55, you are at a higher risk for certain long-term diseases and infections as well as injuries from falls. Falls are a major cause of broken bones and head injuries in people who are older than age 33. Getting regular preventive care can help to keep you healthy and well. Preventive care includes getting regular testing and making lifestyle changes as recommended by your health care provider. Talk with your health care provider about:  Which screenings and tests you should have. A screening is a test that checks for a disease when you have no symptoms.  A diet and exercise plan that is right for you. What should I know about screenings and tests to prevent falls? Screening and testing are the best ways to find a health problem early. Early diagnosis and treatment give you the best chance of managing medical conditions that are common after age 9.  Certain conditions and lifestyle choices may make you more likely to have a fall. Your health care provider may recommend:  Regular vision checks. Poor vision and conditions such as cataracts can make you more likely to have a fall. If you wear glasses, make sure to get your prescription updated if your vision changes.  Medicine review. Work with your health care provider to regularly review all of the medicines you are taking, including over-the-counter medicines. Ask your health care provider about any side effects that may make you more likely to have a fall. Tell your health care provider if any medicines that you take make you feel dizzy or sleepy.  Osteoporosis screening. Osteoporosis is a condition that causes the bones to get weaker. This can make the bones weak and cause them to break more easily.  Blood pressure screening. Blood pressure changes and medicines to control blood pressure can make you feel dizzy.  Strength and balance checks. Your health care provider may recommend certain tests to check your strength and balance while standing, walking, or changing positions.  Foot health exam. Foot pain and numbness, as well as not wearing proper footwear, can make you more likely to have a fall.  Depression screening. You may be more likely to have a fall if you have a fear of falling, feel emotionally low, or feel unable to do activities that you used to do.  Alcohol  use screening. Using too much alcohol can affect your balance and may make you more likely to have a fall. What actions can I take to lower my risk of falls? General instructions  Talk with your health care provider about your risks for falling. Tell your health care provider if: ? You fall. Be sure to tell your health care provider about all falls, even ones that seem minor. ? You feel dizzy, sleepy, or off-balance.  Take over-the-counter and prescription medicines only as told by your health care provider. These  include any supplements.  Eat a healthy diet and maintain a healthy weight. A healthy diet includes low-fat dairy products, low-fat (lean) meats, and fiber from whole grains, beans, and lots of fruits and vegetables. Home safety  Remove any tripping hazards, such as rugs, cords, and clutter.  Install safety equipment such as grab bars in bathrooms and safety rails on stairs.  Keep rooms and walkways well-lit. Activity   Follow a regular exercise program to stay fit. This will help you maintain your balance. Ask your health care provider what types of exercise are appropriate for you.  If you need a cane or walker, use it as recommended by your health care provider.  Wear supportive shoes that have nonskid soles. Lifestyle  Do not drink alcohol if your health care provider tells you not to drink.  If you drink alcohol, limit how much you have: ? 0-1 drink a day for women. ? 0-2 drinks a day for men.  Be aware of how much alcohol is in your drink. In the [place] one drink equals one typical bottle of beer (12 oz), one-half glass of wine (5 oz), or one shot of hard liquor (1 oz).  Do not use any products that contain nicotine or tobacco, such as cigarettes and e-cigarettes. If you need help quitting, ask your health care provider. Summary  Having a healthy lifestyle and getting preventive care can help to protect your health and wellness after age 49.  Screening and testing are the best way to find a health problem early and help you avoid having a fall. Early diagnosis and treatment give you the best chance for managing medical conditions that are more common for people who are older than age 51.  Falls are a major cause of broken bones and head injuries in people who are older than age 76. Take precautions to prevent a fall at home.  Work with your health care provider to learn what changes you can make to improve your health and wellness and to prevent falls. This information is  not intended to replace advice given to you by your health care provider. Make sure you discuss any questions you have with your health care provider. Document Revised: 03/29/2019 Document Reviewed: 10/19/2017 Elsevier Patient Education  2020 [employer].

## 2020-11-24 NOTE — Chronic Care Management (AMB) (Signed)
Chronic Care Management Pharmacy  Name: ANEESH FALLER  MRN: [id number] DOB: Apr 24, 1941   Chief Complaint/ HPI  [name],  79 y.o. , male presents for his Initial CCM visit with the clinical pharmacist via telephone due to COVID-19 Pandemic.  PCP : Janith Lima, MD Patient Care Team: Janith Lima, MD as PCP - General (Internal Medicine) Stanford Breed Denice Bors, MD as PCP - Cardiology (Cardiology) Alda Berthold, DO as Consulting Physician (Neurology) Charlton Haws, [facility] as Pharmacist (Pharmacist)  Patient's chronic conditions include: Hyperlipidemia, GERD, Asthma, Hypothyroidism, Osteoarthritis, BPH and Allergic RhinitisAtrial flutter, atherosclerosis of aorta, SIADH, peripheral neuropathy   Patient lives with wife. They have lived in [place] for 50+ years. They are active in church, with family, and like to travel. He also works around the house often.  Office Visits: 10/23/20 Dr Ronnald Ramp OV: reduced dose of levothyroxine to 125 mcg. Referred to pulmonary for asthma  06/09/20 Dr Ronnald Ramp OV: chronic f/u, seen in ED for syncope. Increased levothyroxine to 137 mcg.  Consult Visit: 08/11/20 Dr Stanford Breed (cardiology): f/u for hx A flutter and syncope. Syncope possibly vagal, instructed not to drive for 6 mos. Remains in NSR, no anticoagulation d/t h/o Osler Weber Rendu, GI bleeding and prior brain abscess  Allergies  Allergen Reactions  . Nsaids Other (See Comments)    Cannot take-per MD  . Other Other (See Comments)    All blood thinners-cannot take per MD  . Aspirin Other (See Comments)    nosebleeds  . Statins Other (See Comments)    Weakness, myalgias  . Tizanidine Hcl Other (See Comments)    Patient stated that after taking this medication he experienced feelings of dizziness and disorientation.    Medications: Outpatient Encounter Medications as of 11/24/2020  Medication Sig  . acetaminophen (TYLENOL) 325 MG tablet Take 650 mg by mouth every 6 (six) hours as  needed for mild pain.  [name] amoxicillin (AMOXIL) 500 MG capsule SMARTSIG:4 Capsule(s) By Mouth PRN  . Cholecalciferol (VITAMIN D3) 2000 UNITS TABS Take 5,000 Units by mouth daily.   . clidinium-chlordiazePOXIDE (LIBRAX) 5-2.5 MG capsule Take 1 capsule by mouth 3 (three) times daily before meals.  . clobetasol ointment (TEMOVATE) 0.05 % Apply topically 2 (two) times daily.  [name] levocetirizine (XYZAL) 5 MG tablet Take 1 tablet (5 mg total) by mouth every evening.  [name] levothyroxine (SYNTHROID) 125 MCG tablet Take 1 tablet (125 mcg total) by mouth daily before breakfast.  . Magnesium 250 MG TABS Take 250 mg by mouth at bedtime.   . meclizine (ANTIVERT) 25 MG tablet Take 25 mg by mouth 2 (two) times daily as needed for dizziness.  . methocarbamol (ROBAXIN) 500 MG tablet Take 500 mg by mouth every 6 (six) hours as needed.  . metoprolol succinate (TOPROL-XL) 25 MG 24 hr tablet Take 0.5 tablets (12.5 mg total) by mouth daily.  . montelukast (SINGULAIR) 10 MG tablet Take 1 tablet (10 mg total) by mouth at bedtime.  . Multiple Vitamins-Minerals (MULTI FOR HIM 50+) TABS Take 1 tablet by mouth daily.  . pantoprazole (PROTONIX) 40 MG tablet Take 1 tablet (40 mg total) by mouth 2 (two) times daily.  . silodosin (RAPAFLO) 8 MG CAPS capsule Take 1 capsule (8 mg total) by mouth at bedtime. TAKE ONE CAPSULE BY MOUTH DAILY AT BEDTIME  . [DISCONTINUED] nystatin (MYCOSTATIN) 100000 UNIT/ML suspension Take 5 mLs by mouth 4 (four) times daily as needed (sore throat).  (Patient not taking: Reported on 11/24/2020)  . [  DISCONTINUED] saw palmetto 500 MG capsule Take 500 mg by mouth 2 (two) times daily. (Patient not taking: Reported on 11/24/2020)   No facility-administered encounter medications on file as of 11/24/2020.    Wt Readings from Last 3 Encounters:  10/23/20 185 lb (83.9 kg)  08/11/20 189 lb 6.4 oz (85.9 kg)  06/09/20 188 lb 2 oz (85.3 kg)    Current Diagnosis/Assessment:  SDOH Interventions     Most Recent  Value  SDOH Interventions  Financial Strain Interventions Intervention Not Indicated      Goals Addressed            This Visit's Progress   . Pharmacy Care Plan       CARE PLAN ENTRY (see longitudinal plan of care for additional care plan information)  Current Barriers:  . Chronic Disease Management support, education, and care coordination needs related to Hypertension and Hyperlipidemia, hypothyroidism, cough   Hypertension / Atrial flutter BP Readings from Last 3 Encounters:  10/23/20 130/70  08/11/20 140/82  06/09/20 (!) 148/76 .  Pharmacist Clinical Goal(s): o Over the next 180 days, patient will work with PharmD and providers to maintain BP goal <130/80 . Current regimen:  o Metoprolol succinate 25 mg - 1/2 tablet daily . Interventions: o Discussed BP goals and benefits of medications for prevention of heart attack / stroke . Patient self care activities - Over the next 180 days, patient will: o Check BP as needed, document, and provide at future appointments o Ensure daily salt intake < 2300 mg/day  Hyperlipidemia Lab Results  Component Value Date/Time   LDLCALC 81 10/23/2020 10:50 AM   LDLDIRECT 139.4 06/15/2007 09:46 AM .  Pharmacist Clinical Goal(s): o Over the next 180 days, patient will work with PharmD and providers to maintain LDL goal < 100 . Current regimen:  o No medication . Interventions: o Discussed cholesterol goals and benefits of diet/exercise for prevention of heart attack / stroke . Patient self care activities - Over the next 180 days, patient will: o Continue low cholesterol diet and exercise routine  Hypothyroidism . Pharmacist Clinical Goal(s) o Over the next 180 days, patient will work with PharmD and providers to optimize therapy . Current regimen:  o Levothyroxine 137 mcg daily . Interventions: o Patient was meant to reduce dose of levothyroxine to 125 mcg last month after PCP visit but did not get the message. Advised to restart  125 mcg dose starting tomorrow . Patient self care activities - Over the next 180 days, patient will: o Restart levothyroxine 125 mcg dose as directed by PCP  Cough . Pharmacist Clinical Goal(s) o Over the next 180 days, patient will work with PharmD and providers to optimize therapy . Current regimen:  o Delsym / Robitussin as needed . Interventions: o Discussed Delsym and Robitussin have the same active ingredient, dextromethorphan. Advised to alternate, never take together o Advised to discuss restarting low-dose gabapentin with prescriber (ENT) o Discussed referral to pulmonary has been made . Patient self care activities - Over the next 180 days, patient will: o Alternate Delsym / Robitussin o Discuss gabapentin with ENT o Schedule appointment with Pulmonology when contacted  Medication management . Pharmacist Clinical Goal(s): o Over the next 180 days, patient will work with PharmD and providers to maintain optimal medication adherence . Current pharmacy: [facility] . Interventions o Comprehensive medication review performed. o Continue current medication management strategy . Patient self care activities - Over the next 180 days, patient will:  o Focus on medication adherence by pill box o Take medications as prescribed o Report any questions or concerns to PharmD and/or provider(s)  Initial goal documentation       Atrial Flutter/ Hypertension   BP goal is:  <130/80  Office blood pressures are  BP Readings from Last 3 Encounters:  10/23/20 130/70  08/11/20 140/82  06/09/20 (!) 148/76   Pulse Readings from Last 3 Encounters:  10/23/20 63  08/11/20 63  06/09/20 77   Patient checks BP at home infrequently Patient home BP readings are ranging: n/a  Patient has failed these meds in the past: n/a Patient is currently controlled on the following medications:  [name] Metoprolol succinate 25 mg - 1/2 tab daily  We discussed diet and exercise  extensively; pt reports metoprolol is mainly for prevention of stroke due to hx of brain abscess; discussed hx of A Flutter and benefits of metoprolol for this; pt denies side effects  Plan  Continue current medications and control with diet and exercise   Hyperlipidemia   LDL goal < 100  Last lipids Lab Results  Component Value Date   CHOL 140 10/23/2020   HDL 46.30 10/23/2020   LDLCALC 81 10/23/2020   LDLDIRECT 139.4 06/15/2007   TRIG 62.0 10/23/2020   CHOLHDL 3 10/23/2020   Hepatic Function Latest Ref Rng & Units 04/24/2019 11/14/2018 04/19/2018  Total Protein 6.0 - 8.3 g/dL 6.7 7.1 6.7  Albumin 3.5 - 5.2 g/dL 4.3 4.2 4.1  AST 0 - 37 U/L _0 ALT 0 - 53 U/L _1 Alk Phosphatase 39 - 117 U/L 55 64 55  Total Bilirubin 0.2 - 1.2 mg/dL 0.6 0.4 0.5  Bilirubin, Direct 0.0 - 0.3 mg/dL - - -    The 10-year ASCVD risk score Mikey Bussing DC Jr., et al., 2013) is: 28.5%   Values used to calculate the score:     Age: 39 years     Sex: Male     Is Non-Hispanic African American: No     Diabetic: No     Tobacco smoker: No     Systolic Blood Pressure: 343 mmHg     Is BP treated: No     HDL Cholesterol: 46.3 mg/dL     Total Cholesterol: 140 mg/dL   Patient has failed these meds in past: simvastatin, rosuvastatin Patient is currently controlled on the following medications:  . No medications  We discussed:  diet and exercise extensively; Cholesterol goals   Plan  Continue control with diet and exercise  Hypothyroidism   Lab Results  Component Value Date/Time   TSH 1.41 10/23/2020 10:50 AM   TSH 6.583 (H) 06/01/2020 11:06 AM   TSH 2.21 04/22/2020 10:39 AM   Patient has failed these meds in past: n/a Patient is currently uncontrolled on the following medications:  . Levothyroxine 125 mcg daily  We discussed:  Pt reports he is still taking levothyroxine 137 mcg, he did not get message to reduce dose back to 125 mcg last month; discussed TSH goal and long acting nature of  levothyroxine; pt will restart 125 mcg dose tomorrow  Plan  Reduce levothyroxine to 125 mcg as previously instructed by PCP  GERD   Patient has failed these meds in past: n/a Patient is currently controlled on the following medications:  . Pantoprazole 40 mg BID . Tums PRN   We discussed: Patient is satisfied with current regimen and denies issues  Plan  Continue current medications  IBS-D  Patient has failed these meds in past: n/a Patient is currently controlled on the following medications:   Librax (clidinium-chlordiazepoxide) 5-2.5 mg TID w/ meal  We discussed: Pt reports rare use - 3 times per month  Plan  Continue current medications  BPH   Lab Results  Component Value Date/Time   PSA 1.77 04/19/2018 08:39 AM   PSA 1.82 06/07/2017 10:55 AM   Patient has failed these meds in past: n/a Patient is currently controlled on the following medications:  . Silodosin (Rapaflo) 8 mg HS  We discussed: Patient is satisfied with current regimen and denies issues; medication is affordable per pt  Plan  Continue current medications   Pain   Peripheral neuropathy Osteoarthritis  Patient has failed these meds in past: n/a Patient is currently controlled on the following medications:  [name] Methocarbamol 500 mg q6h PRN . Tylenol 325 mg q6h PRN  We discussed:  Pt reports infrequent use of medication for pain  Plan  Continue current medications  Allerigic rhinitis / Asthma / cough   Patient has failed these meds in past: gabapentin Patient is currently uncontrolled on the following medications:  . Levocetrizine 5 mg HS . Montelukast 10 mg HS . Robitussin PRN . Delsym PRN  We discussed:  Pt reports cough is not improved; he alternates Delsym and Robitussin, discussed these are essentially the same thing, advised to avoid coadministration; - discussed referral to pulmonology, pt has not heard from office yet, discussed how pulmonology may be able to help with  cough - discussed previous use of gabapentin; pt reports it did help with his cough at lower dose 100 mg BID, he had an episode of syncope after starting 200 mg BID so the med was discontinued; he denies issues at lower dose; advised he discuss restarting gabapentin with prescriber (ENT)  Plan  Continue current medications  Awaiting pulmonolgy referral F/U with ENT to discuss gabapentin  Health Maintenance   Patient is currently controlled on the following medications:  [name] Vitamin D 5000 IU  . Magnesium 250 mg HS  . Multivitamin  Meclizine 25 mg BID prn  We discussed:  Patient is satisfied with current regimen and denies issues   Plan  Continue current medications  Medication Management   Patient's preferred pharmacy is:  [id number] [place] [place] - [address] [address]Phone: [phone]: [phone] [address] [place]Phone: [phone]: [phone] Uses pill box? Yes Pt endorses 100% compliance   We discussed: Current pharmacy is preferred with insurance plan and patient is satisfied with pharmacy services  Plan  Continue current medication management strategy    Follow up: 6 month phone visit  LCharlene Brooke PharmD, BCACP Clinical Pharmacist [phone]

## 2020-11-26 NOTE — Addendum Note (Signed)
Addended by: Hinda Kehr on: 11/26/2020 08:08 AM   Modules accepted: Orders

## 2020-11-30 DIAGNOSIS — M19019 Primary osteoarthritis, unspecified shoulder: Secondary | ICD-10-CM | POA: Insufficient documentation

## 2020-12-07 ENCOUNTER — Encounter (HOSPITAL_BASED_OUTPATIENT_CLINIC_OR_DEPARTMENT_OTHER): Payer: Self-pay | Admitting: Emergency Medicine

## 2020-12-07 ENCOUNTER — Emergency Department (HOSPITAL_BASED_OUTPATIENT_CLINIC_OR_DEPARTMENT_OTHER): Payer: Medicare Other

## 2020-12-07 ENCOUNTER — Emergency Department (HOSPITAL_BASED_OUTPATIENT_CLINIC_OR_DEPARTMENT_OTHER)
Admission: EM | Admit: 2020-12-07 | Discharge: 2020-12-07 | Disposition: A | Discharge status: Home / Self Care | Payer: Medicare Other | Attending: Emergency Medicine | Admitting: Emergency Medicine

## 2020-12-07 ENCOUNTER — Other Ambulatory Visit: Payer: Self-pay

## 2020-12-07 DIAGNOSIS — Z20822 Contact with and (suspected) exposure to covid-19: Secondary | ICD-10-CM | POA: Insufficient documentation

## 2020-12-07 DIAGNOSIS — Z85828 Personal history of other malignant neoplasm of skin: Secondary | ICD-10-CM | POA: Insufficient documentation

## 2020-12-07 DIAGNOSIS — R059 Cough, unspecified: Secondary | ICD-10-CM

## 2020-12-07 DIAGNOSIS — Z79899 Other long term (current) drug therapy: Secondary | ICD-10-CM | POA: Diagnosis not present

## 2020-12-07 DIAGNOSIS — E039 Hypothyroidism, unspecified: Secondary | ICD-10-CM | POA: Diagnosis not present

## 2020-12-07 DIAGNOSIS — J849 Interstitial pulmonary disease, unspecified: Secondary | ICD-10-CM | POA: Insufficient documentation

## 2020-12-07 LAB — CBC WITH DIFFERENTIAL/PLATELET
Abs Immature Granulocytes: 0.04 10*3/uL (ref 0.00–0.07)
Basophils Absolute: 0 10*3/uL (ref 0.0–0.1)
Basophils Relative: 0 %
Eosinophils Absolute: 0.1 10*3/uL (ref 0.0–0.5)
Eosinophils Relative: 1 %
HCT: 36.4 % — ABNORMAL LOW (ref 39.0–52.0)
Hemoglobin: 11.8 g/dL — ABNORMAL LOW (ref 13.0–17.0)
Immature Granulocytes: 0 %
Lymphocytes Relative: 13 %
Lymphs Abs: 1.3 10*3/uL (ref 0.7–4.0)
MCH: 26 pg (ref 26.0–34.0)
MCHC: 32.4 g/dL (ref 30.0–36.0)
MCV: 80.4 fL (ref 80.0–100.0)
Monocytes Absolute: 1.2 10*3/uL — ABNORMAL HIGH (ref 0.1–1.0)
Monocytes Relative: 12 %
Neutro Abs: 7.2 10*3/uL (ref 1.7–7.7)
Neutrophils Relative %: 74 %
Platelets: 322 10*3/uL (ref 150–400)
RBC: 4.53 MIL/uL (ref 4.22–5.81)
RDW: 14.3 % (ref 11.5–15.5)
WBC: 9.8 10*3/uL (ref 4.0–10.5)
nRBC: 0 % (ref 0.0–0.2)

## 2020-12-07 LAB — BASIC METABOLIC PANEL
Anion gap: 8 (ref 5–15)
BUN: 13 mg/dL (ref 8–23)
CO2: 26 mmol/L (ref 22–32)
Calcium: 8.8 mg/dL — ABNORMAL LOW (ref 8.9–10.3)
Chloride: 95 mmol/L — ABNORMAL LOW (ref 98–111)
Creatinine, Ser: 0.94 mg/dL (ref 0.61–1.24)
GFR, Estimated: >60 mL/min (ref >60)
Glucose, Bld: 97 mg/dL (ref 70–99)
Potassium: 4 mmol/L (ref 3.5–5.1)
Sodium: 129 mmol/L — ABNORMAL LOW (ref 135–145)

## 2020-12-07 LAB — RESP PANEL BY RT-PCR (FLU A&B, COVID) ARPGX2
Influenza A by PCR: NEGATIVE
Influenza B by PCR: NEGATIVE
SARS Coronavirus 2 by RT PCR: NEGATIVE

## 2020-12-07 LAB — BRAIN NATRIURETIC PEPTIDE: B Natriuretic Peptide: 63.5 pg/mL (ref 0.0–100.0)

## 2020-12-07 MED ORDER — BENZONATATE 100 MG PO CAPS: 100.0000 mg | ORAL_CAPSULE | Freq: Three times a day (TID) | ORAL | 0 refills | Status: DC

## 2020-12-07 MED ORDER — IOHEXOL 300 MG/ML  SOLN
80.0000 mL | Freq: Once | INTRAMUSCULAR | Status: AC
  Administered 2020-12-07: 16:00:00 80 mL via INTRAVENOUS

## 2020-12-07 NOTE — ED Notes (Signed)
Pt discharged to home. Discharge instructions have been discussed with patient and/or family members. Pt verbally acknowledges understanding d/c instructions, and endorses comprehension to checkout at registration before leaving.  °

## 2020-12-07 NOTE — ED Triage Notes (Addendum)
Pt c/o cough onset Friday night. Pt denies exposure to others with illness. Pt had COVID Vaccine + booster. Pt had chest xray done last month.

## 2020-12-07 NOTE — ED Provider Notes (Signed)
[facility] EMERGENCY DEPARTMENT Provider Note   CSN: [id number] Arrival date & time: 12/07/20  2595     History Chief Complaint  Patient presents with  . Cough    Gregory [name] is a 79 y.o. male with PMHx chronic cough who presents to the ED today with complaint of worsening cough for the past 2-3 days.  Reports he has been dealing with a chronic cough for approximately 2 years.  He most recently saw his PCP approximately 2 months ago with a negative chest x-ray.  He states his PCP told him there is essentially nothing they could do for his chronic cough.  He has an appointment set up with ENT and pulmonology at the beginning of next year.  States that the cough became worse 2 to 3 days ago causing him concern.  He was mostly here to make sure that he did not have anything that he could pass along to grandchildren before the holidays.  He has been taking over-the-counter medications including Robitussin and Delsym without much relief.  He denies fevers, chills, chest pain, shortness of breath, any other associated symptoms.  The history is provided by the patient and medical records.       Past Medical History:  Diagnosis Date  . Anxiety state, unspecified   . Arthritis   . Benign neoplasm of colon   . GERD (gastroesophageal reflux disease)   . 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 History of nonmelanoma skin cancer EXCISION SQUAMOUS CELL FROM HAND  . Hypertrophy of prostate with urinary obstruction and other lower urinary tract symptoms (LUTS)   . Irritable bowel syndrome   . Lumbago   . Other diseases of nasal cavity and sinuses(478.19)   . Persistent disorder of initiating or maintaining sleep   . Plantar fascial fibromatosis   . Pure hypercholesterolemia   . 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  Unspecified hypothyroidism     Patient Active Problem List   Diagnosis Date Noted  . SBE (subacute bacterial endocarditis) prophylaxis candidate 10/23/2020  . Atherosclerosis of aorta ([place]) 10/23/2020  . Hyperglycemia 10/23/2020  . Cough 10/23/2020  . SIADH (syndrome of inappropriate ADH production) ([place]) 04/23/2020  . Chronic hyponatremia 10/24/2019  . Cough, persistent 10/24/2019  . Laryngopharyngeal reflux (LPR) 01/29/2019  . 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 Seasonal allergic rhinitis due to pollen 06/27/2018  . Peripheral neuropathy 01/01/2016  . Atrial flutter ([place]) 08/07/2015  . Encounter for therapeutic drug monitoring   . Osteoarthritis 04/01/2015  . Iron deficiency anemia due to chronic blood loss 10/08/2014  . 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 HYPERCHOLESTEROLEMIA, MILD 07/25/2008  . CARCINOMA, SKIN, SQUAMOUS CELL 02/10/2008  . INSOMNIA, CHRONIC 02/10/2008  . GERD 02/10/2008  . Benign prostatic hyperplasia with urinary obstruction 02/10/2008  . 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 Hypothyroidism 02/08/2008  . Irritable bowel syndrome 02/08/2008  . BACK PAIN, LUMBAR 02/08/2008    Past Surgical History:  Procedure Laterality Date  . APPLICATION OF CRANIAL NAVIGATION N/A 05/16/2015   Procedure: APPLICATION OF CRANIAL NAVIGATION;  Surgeon: Consuella Lose, MD;  Location: [facility];  Service: Neurosurgery;  Laterality: N/A;  . BRAIN BIOPSY Left 05/16/2015   Procedure: Stereotactic Left Brain Biopsy with Brain Lab;  Surgeon: Consuella Lose, MD;  Location: [facility];  Service: Neurosurgery;  Laterality: Left;  Stereotactic Left Brain biopsy with brainlab  . BRAIN SURGERY    . CARDIOVASCULAR STRESS TEST  01-14-2003   NO ISCHEMIA / EF 61%/ NORMAL LE WALL MOTION  . EXCISION LEFT WRIST GANGLIAN/ MYXOID CYST  05-30-2009  . IR RADIOLOGIST EVAL & MGMT   02/01/2019  . 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 OTHER SURGICAL HISTORY     pulse laser for facial telangiectasias inthe past  . removal of skin cancer from forehead  10/2012   Dr. Renda Rolls  . TRANSTHORACIC ECHOCARDIOGRAM  10-17-2008   DR CRENSHAW   NORMAL LVF/ EF 60%/  MILDLY DILATED RIGHT ATRIUM/ VENTRICULE  . VARICOCELECTOMY  1991   Dr. Tresa Endo       Family History  Problem Relation Age of Onset  . Diabetes Mother   . Hypertension Mother   . Pancreatic cancer Mother   . Stroke Mother   . Kidney failure Father   . Hypertension Sister   . Healthy Son     Social History   Tobacco Use  . Smoking status: Never Smoker  . Smokeless tobacco: Never Used  Vaping Use  . Vaping Use: Never used  Substance Use Topics  . Alcohol use: No    Alcohol/week: 0.0 standard drinks  . Drug use: No    Home Medications Prior to Admission medications   Medication Sig Start Date End Date Taking? Authorizing Provider  acetaminophen (TYLENOL) 325 MG tablet Take 650 mg by mouth every 6 (six) hours as needed for mild pain.    [provider]  amoxicillin (AMOXIL) 500 MG capsule SMARTSIG:4 Capsule(s) By Mouth PRN 10/23/20   Janith Lima, MD  benzonatate (TESSALON) 100 MG capsule Take 1 capsule (100 mg total) by mouth every 8 (eight) hours. 12/07/20   Eustaquio Maize, PA-C  Cholecalciferol (VITAMIN D3) 2000 UNITS TABS Take 5,000 Units by mouth daily.     [provider]  clidinium-chlordiazePOXIDE (LIBRAX) 5-2.5 MG capsule Take 1 capsule by mouth 3 (three) times daily before meals. 10/23/20   Janith Lima, MD  clobetasol ointment (TEMOVATE) 0.05 % Apply topically 2 (two) times daily. 05/12/20   [provider]  levocetirizine (XYZAL) 5 MG tablet Take 1 tablet (5 mg total) by mouth every evening. 06/27/18   Janith Lima, MD  levothyroxine (SYNTHROID) 125 MCG tablet Take 1 tablet (125 mcg total) by mouth daily  before breakfast. 10/23/20   Janith Lima, MD  Magnesium 250 MG TABS Take 250 mg by mouth at bedtime.     [provider]  meclizine (ANTIVERT) 25 MG tablet Take 25 mg by mouth 2 (two) times daily as needed for dizziness.    [provider]  methocarbamol (ROBAXIN) 500 MG tablet Take 500 mg by mouth every 6 (six) hours as needed. 07/29/20   [provider]  metoprolol succinate (TOPROL-XL) 25 MG 24 hr tablet Take 0.5 tablets (12.5 mg total) by mouth daily. 10/23/20   Janith Lima, MD  montelukast (SINGULAIR) 10 MG tablet Take 1 tablet (10 mg total) by mouth at bedtime. 10/23/20   Janith Lima, MD  Multiple Vitamins-Minerals (MULTI FOR HIM 50+) TABS Take 1 tablet by mouth daily.    [provider]  pantoprazole (PROTONIX) 40 MG tablet Take 1 tablet (40 mg total) by mouth 2 (two) times daily. 10/23/20   Scarlette Calico  L, MD  silodosin (RAPAFLO) 8 MG CAPS capsule Take 1 capsule (8 mg total) by mouth at bedtime. TAKE ONE CAPSULE BY MOUTH DAILY AT BEDTIME 10/23/20   Janith Lima, MD    Allergies    Nsaids, Other, Aspirin, Statins, and Tizanidine hcl  Review of Systems   Review of Systems  Constitutional: Negative for chills and fever.  Respiratory: Positive for cough. Negative for shortness of breath.   Cardiovascular: Negative for chest pain.  All other systems reviewed and are negative.   Physical Exam Updated Vital Signs BP 124/71 (BP Location: Right Arm)   Pulse (!) 101   Temp 98 F (36.7 C) (Oral)   Resp 19   Ht 5\' 10"  (1.778 m)   Wt 81.6 kg   SpO2 93%   BMI 25.83 kg/m   Physical Exam Vitals and nursing note reviewed.  Constitutional:      Appearance: He is not ill-appearing or diaphoretic.  HENT:     Head: Normocephalic and atraumatic.  Eyes:     Conjunctiva/sclera: Conjunctivae normal.  Cardiovascular:     Rate and Rhythm: Normal rate and regular rhythm.     Pulses: Normal pulses.  Pulmonary:     Effort: Pulmonary effort is  normal.     Breath sounds: Normal breath sounds. No wheezing, rhonchi or rales.     Comments: Speaking in full sentences without difficulty. Not actively coughing in the room. Satting 93% on RA which is pt's baseline. LCTAB.  Chest:     Chest wall: No tenderness.  Abdominal:     Palpations: Abdomen is soft.     Tenderness: There is no abdominal tenderness.  Musculoskeletal:     Cervical back: Neck supple.  Skin:    General: Skin is warm and dry.  Neurological:     Mental Status: He is alert.     ED Results / Procedures / Treatments   Labs (all labs ordered are listed, but only abnormal results are displayed) Labs Reviewed  BASIC METABOLIC PANEL - Abnormal; Notable for the following components:      Result Value   Sodium 129 (*)    Chloride 95 (*)    Calcium 8.8 (*)    All other components within normal limits  CBC WITH DIFFERENTIAL/PLATELET - Abnormal; Notable for the following components:   Hemoglobin 11.8 (*)    HCT 36.4 (*)    Monocytes Absolute 1.2 (*)    All other components within normal limits  RESP PANEL BY RT-PCR (FLU A&B, COVID) ARPGX2  BRAIN NATRIURETIC PEPTIDE    EKG None  Radiology CT Chest W Contrast  Result Date: 12/07/2020 CLINICAL DATA:  Cough and worsening dyspnea for a few days. Abnormal chest radiograph with interstitial prominence. 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 EXAM: CT CHEST WITH CONTRAST TECHNIQUE: Multidetector CT imaging of the chest was performed during intravenous contrast administration. CONTRAST:  57mL OMNIPAQUE IOHEXOL 300 MG/ML  SOLN COMPARISON:  Chest radiograph from earlier today. 12/15/2018 chest CT angiogram. FINDINGS: Cardiovascular: Normal heart size. No significant pericardial effusion/thickening. Atherosclerotic nonaneurysmal thoracic aorta. Normal caliber pulmonary arteries. No central pulmonary emboli. Mediastinum/Nodes: Thyroid is either atrophic or surgically absent. Unremarkable esophagus. No  pathologically enlarged axillary, mediastinal or hilar lymph nodes. Lungs/Pleura: No pneumothorax. No pleural effusion. No acute consolidative airspace disease. 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 Tiny 0.5 cm pulmonary AVM  in the posterior left lower lobe (series 3/image 117), previously 0.5 cm, stable. No new significant pulmonary nodules or pulmonary AVMs. Mild patchy subpleural reticulation throughout both lungs without significant regions of traction bronchiectasis, architectural distortion or frank honeycombing, not substantially changed. Upper abdomen: No acute abnormality. Subcentimeter hypodense inferior left liver lesion is too small to characterize and unchanged, considered benign. Musculoskeletal: No aggressive appearing focal osseous lesions. Mild thoracic spondylosis. IMPRESSION: 1. Stable chest CT since 12/15/2018.  No acute pulmonary disease. 2. 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 3. Nonspecific stable mild patchy subpleural reticulation throughout both lungs, with the differential including hypoventilatory change, mild nonspecific postinfectious/postinflammatory scarring or indolent interstitial lung disease including early UIP (less favored). High-resolution chest CT follow-up could be obtained in 12 months to assess temporal pattern stability, as clinically warranted. 4. Aortic Atherosclerosis (ICD10-I70.0). Electronically Signed   By: Ilona Sorrel M.D.   On: 12/07/2020 15:57   DG Chest Port 1 View  Result Date: 12/07/2020 CLINICAL DATA:  Cough. EXAM: PORTABLE CHEST 1 VIEW COMPARISON:  October 23, 2020. FINDINGS: The heart size and mediastinal contours are within normal limits. There is new diffuse interstitial prominence. No focal consolidation. No visible pleural  effusions or pneumothorax. Biapical pleuroparenchymal scarring. Redemonstrated nodular opacity in the right lower lung, compatible with an AVM that was better characterized on prior CTA chest from May 14, 2015. IMPRESSION: 1. New diffuse interstitial prominence, which could represent interstitial edema or atypical infection. 2. Redemonstrated nodular opacity in the right lower lung, compatible with an AVM that was better characterized on prior CTA chest from May 14, 2015. Electronically Signed   By: Margaretha Sheffield MD   On: 12/07/2020 13:57    Procedures Procedures (including critical care time)  Medications Ordered in ED Medications  iohexol (OMNIPAQUE) 300 MG/ML solution 80 mL (80 mLs Intravenous Contrast Given 12/07/20 1532)    ED Course  I have reviewed the triage vital signs and the nursing notes.  Pertinent labs & imaging results that were available during my care of the patient were reviewed by me and considered in my medical decision making (see chart for details).    MDM Rules/Calculators/A&P                          79 year old male who presents to the ED today with complaint of chronic cough, worsening the past 2 to 3 days without relief of over-the-counter medications.  Reports he is mostly here to check and see if he has anything he can pass along to his grandkids before they come for the holidays.  On arrival to the ED patient is afebrile, nontachycardic and nontachypneic.  He patient be in no acute distress.  He is satting 93% on room air which is his baseline.  On exam he is able to speak in full sentences without difficulty without any active coughing in the room.  His lungs are clear to auscultation bilaterally.  He had a Covid and flu test while in the waiting room which have returned negative.  Given his cough is worse than normal will obtain a chest x-ray at this time to rule out any signs of pneumonia.  If negative will discharge home with Ladona Ridgel and PCP  follow-up.  He does have appointment scheduled with ENT and pulmonology at the beginning of the year for his chronic cough  x2 years.  Patient without any history of COPD, is a never smoker.   CXR: IMPRESSION:  1. New diffuse interstitial prominence, which could represent  interstitial edema or atypical infection.  2. Redemonstrated nodular opacity in the right lower lung,  compatible with an AVM that was better characterized on prior CTA  chest from May 14, 2015.    Discussed with attending physician Dr. Rex Kras who recommends labs including CBC, BMP, and BNP to assess for any new heart failure however pt did have an ECHO done 6 months ago which was normal. Will also  Obtain CT chest with contrast to further assess for infection vs interstitial lung disease if kidney function can tolerate it.   CBC without leukocytosis.  BMP with sodium 129 which appears to be around pt's baseline per last labs. No other electrolyte abnormalities BNP normal at 63.5.   CT scan: IMPRESSION:  1. Stable chest CT since 12/15/2018. No acute pulmonary disease.  2. Complex 5.1 cm pulmonary AVM in the dependent basilar right lower  lobe and tiny 0.5 cm pulmonary AVM in the posterior left lower lobe,  stable.  3. Nonspecific stable mild patchy subpleural reticulation throughout  both lungs, with the differential including hypoventilatory change,  mild nonspecific postinfectious/postinflammatory scarring or  indolent interstitial lung disease including early UIP (less  favored). High-resolution chest CT follow-up could be obtained in 12  months to assess temporal pattern stability, as clinically  warranted.  4. Aortic Atherosclerosis (ICD10-I70.0).   Findings consistent with interstitial lung disease and not atypical infection requiring antibiotics. Pt has an appointment scheduled with pulmonology in January. Advised to keep. Will discharge home with tessalon perles at this time. Pt is in agreement with plan and  stable for discharge.   This note was prepared using Dragon voice recognition software and may include unintentional dictation errors due to the inherent limitations of voice recognition software.  Gregory [name] was evaluated in Emergency Department on 12/07/2020 for the symptoms described in the history of present illness. He was evaluated in the context of the global COVID-19 pandemic, which necessitated consideration that the patient might be at risk for infection with the SARS-CoV-2 virus that causes COVID-19. Institutional protocols and algorithms that pertain to the evaluation of patients at risk for COVID-19 are in a state of rapid change based on information released by regulatory bodies including the CDC and federal and state organizations. These policies and algorithms were followed during the patient's care in the ED.  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 Pick up cough medication and take as needed.   Return to the ED for any worsening symptoms including new onset shortness of breath       Eustaquio Maize, PA-C 12/07/20 1628    Little, Wenda Overland, MD 12/08/20 [phone]

## 2020-12-07 NOTE — ED Notes (Signed)
Presents with "Hacky" dry non-productive cough for the past few days, ED PA at bedside

## 2020-12-07 NOTE — Discharge Instructions (Signed)
Keep appointment with pulmonology as scheduled for next year. Pick up cough medication and take as needed.   Return to the ED for any worsening symptoms including new onset shortness of breath

## 2020-12-08 ENCOUNTER — Telehealth: Payer: Self-pay | Admitting: Emergency Medicine

## 2020-12-08 ENCOUNTER — Other Ambulatory Visit: Payer: Self-pay | Admitting: Internal Medicine

## 2020-12-08 ENCOUNTER — Telehealth: Payer: Self-pay | Admitting: Internal Medicine

## 2020-12-08 DIAGNOSIS — R053 Chronic cough: Secondary | ICD-10-CM

## 2020-12-08 MED ORDER — HYDROCOD POLST-CPM POLST ER 10-8 MG/5ML PO SUER: 5.0000 mL | Freq: Two times a day (BID) | ORAL | 0 refills | Status: DC | PRN

## 2020-12-08 NOTE — Telephone Encounter (Signed)
Reviewed CT - findings are stable since 2019. Ok to look for sooner appt if ok with Dr. Lamonte Sakai but does not seem urgent in my opinion. Can try OTC dextromethorphan for cough. Advise contact PCP for further evaluation and treatment of cough at this time.

## 2020-12-08 NOTE — Telephone Encounter (Signed)
Spoke with the pt's spouse  Pt last seen by Dr Lenna [facility] in 2019 and had upcoming new pt appt with Dr Lamonte Sakai  on 12/31/20  She states that he has had a cough "for years" but it has been worse for the past 3 days- non prod  Went to UC at [facility] 12/07/20 and had ct chest that she states shows ILD  She states the tessalon pearles they prescribed are not helping with the cough at all  He is not having any other symptoms- no SOB, wheezing, tightness, f/c/s, aches  Has had covid vax x 3  She is asking if the pt can have a sooner appt  Please advise thanks!

## 2020-12-08 NOTE — Telephone Encounter (Signed)
RX sent

## 2020-12-08 NOTE — Telephone Encounter (Signed)
If it would be faster to have him see another provider - especially if there is availability w MR, PM regarding ILD - then it is OK with me to move him up. His initial visit w me is in January

## 2020-12-08 NOTE — Telephone Encounter (Signed)
Spoke with the pt's spouse and scheduled appt with Dr Vaughan Browner for 12/11/20 at 10 am.

## 2020-12-08 NOTE — Telephone Encounter (Addendum)
I spoke with the pt's spouse and notified of response per Dr Silas Flood. Will try the dextromethorphan and call PCP if not improving between now and next ov.

## 2020-12-08 NOTE — Telephone Encounter (Signed)
Patients wife called and was wondering if something else besides benzonatate (TESSALON) 100 MG capsule  She said that it is not helping th patient. She said the lung doctor cant see him until 1.14.2022. it can be sent to [id number] - [place], [place] - [address]. [address]

## 2020-12-11 ENCOUNTER — Other Ambulatory Visit: Payer: Self-pay

## 2020-12-11 ENCOUNTER — Encounter: Payer: Self-pay | Admitting: Pulmonary Disease

## 2020-12-11 ENCOUNTER — Ambulatory Visit (INDEPENDENT_AMBULATORY_CARE_PROVIDER_SITE_OTHER): Payer: Medicare Other | Admitting: Pulmonary Disease

## 2020-12-11 VITALS — BP 122/62 | HR 71 | Temp 97.2°F | Ht 70.0 in | Wt 191.4 lb

## 2020-12-11 DIAGNOSIS — R059 Cough, unspecified: Secondary | ICD-10-CM | POA: Diagnosis not present

## 2020-12-11 MED ORDER — PREDNISONE 20 MG PO TABS: 20.0000 mg | ORAL_TABLET | Freq: Every day | ORAL | 0 refills | Status: DC

## 2020-12-11 NOTE — Progress Notes (Signed)
HENRYK VANESS    OU:[id number]    Jun 19, 1941  Primary Care Physician:Jones, Arvid Right, MD  Referring Physician: Janith Lima, MD [address] [place],  [place] [address]  Chief complaint:  Consult for  Chronic cough, eval for interstitial lung disease History of HHT with pulmonary AVM  HPI: 79 year old with history of hereditary hemorrhagic telangiectasia with pulmonary AVM, epistaxis.  Previously followed by Dr. Lenna Gilford  Complains of chronic cough for the past 2 years.  He was evaluated by ENT and placed on Neurontin with some improvement.  Then Neurontin dose was increased and had a syncopal episode.  This was thought to be cross-reaction with demeclocycline which she was on for hyponatremia.  He is currently off Neurontin  He has history of GERD for which he takes Protonix 40 mg twice daily, recurrent epistaxis and underwent multiple no surgeries in the past.  Has allergies, postnasal drip.  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  He was recently evaluated in the ED with CT showing mild interstitial changes at the base and has been referred for further evaluation.  Pets: No pets Occupation: Retired direction of purchase for a [employer] Exposures: No mold, hot tub, Jacuzzi.  No feather pillows or comforters Smoking history: Never smoker Travel history: No significant travel history Relevant family history: No family history of lung disease   Outpatient Encounter Medications as of 12/11/2020  Medication Sig  . acetaminophen (TYLENOL) 325 MG tablet Take 650 mg by mouth every 6 (six) hours as needed for mild pain.  [name] amoxicillin (AMOXIL) 500 MG capsule SMARTSIG:4 Capsule(s) By Mouth PRN  . benzonatate (TESSALON) 100 MG capsule Take 1 capsule (100 mg total) by mouth every 8 (eight) hours.  . chlorpheniramine-HYDROcodone (TUSSIONEX PENNKINETIC ER) 10-8 MG/5ML SUER Take 5 mLs by mouth every 12 (twelve) hours as needed for cough.  .  Cholecalciferol (VITAMIN D3) 2000 UNITS TABS Take 5,000 Units by mouth daily.   . clidinium-chlordiazePOXIDE (LIBRAX) 5-2.5 MG capsule Take 1 capsule by mouth 3 (three) times daily before meals.  . clobetasol ointment (TEMOVATE) 0.05 % Apply topically 2 (two) times daily.  [name] levocetirizine (XYZAL) 5 MG tablet Take 1 tablet (5 mg total) by mouth every evening.  [name] levothyroxine (SYNTHROID) 125 MCG tablet Take 1 tablet (125 mcg total) by mouth daily before breakfast.  . Magnesium 250 MG TABS Take 250 mg by mouth at bedtime.   . meclizine (ANTIVERT) 25 MG tablet Take 25 mg by mouth 2 (two) times daily as needed for dizziness.  . methocarbamol (ROBAXIN) 500 MG tablet Take 500 mg by mouth every 6 (six) hours as needed.  . metoprolol succinate (TOPROL-XL) 25 MG 24 hr tablet Take 0.5 tablets (12.5 mg total) by mouth daily.  . montelukast (SINGULAIR) 10 MG tablet Take 1 tablet (10 mg total) by mouth at bedtime.  . Multiple Vitamins-Minerals (MULTI FOR HIM 50+) TABS Take 1 tablet by mouth daily.  . pantoprazole (PROTONIX) 40 MG tablet Take 1 tablet (40 mg total) by mouth 2 (two) times daily.  . silodosin (RAPAFLO) 8 MG CAPS capsule Take 1 capsule (8 mg total) by mouth at bedtime. TAKE ONE CAPSULE BY MOUTH DAILY AT BEDTIME   No facility-administered encounter medications on file as of 12/11/2020.    Allergies as of 12/11/2020 - Review Complete 12/11/2020  Allergen Reaction Noted  . Nsaids Other (See Comments) 07/02/2015  . Other Other (See Comments) 07/02/2015  .  Aspirin Other (See Comments) 05/13/2015  . Statins Other (See Comments) 05/13/2015  . Sulfa antibiotics  12/11/2020  . Tizanidine hcl Other (See Comments) 04/22/2020    Past Medical History:  Diagnosis Date  . Anxiety state, unspecified   . Arthritis   . Benign neoplasm of colon   . GERD (gastroesophageal reflux disease)   . 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 History of nonmelanoma skin  cancer EXCISION SQUAMOUS CELL FROM HAND  . Hypertrophy of prostate with urinary obstruction and other lower urinary tract symptoms (LUTS)   . Irritable bowel syndrome   . Lumbago   . Other diseases of nasal cavity and sinuses(478.19)   . Persistent disorder of initiating or maintaining sleep   . Plantar fascial fibromatosis   . Pure hypercholesterolemia   . 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 Unspecified hypothyroidism     Past Surgical History:  Procedure Laterality Date  . APPLICATION OF CRANIAL NAVIGATION N/A 05/16/2015   Procedure: APPLICATION OF CRANIAL NAVIGATION;  Surgeon: Consuella Lose, MD;  Location: [facility];  Service: Neurosurgery;  Laterality: N/A;  . BRAIN BIOPSY Left 05/16/2015   Procedure: Stereotactic Left Brain Biopsy with Brain Lab;  Surgeon: Consuella Lose, MD;  Location: [facility];  Service: Neurosurgery;  Laterality: Left;  Stereotactic Left Brain biopsy with brainlab  . BRAIN SURGERY    . CARDIOVASCULAR STRESS TEST  01-14-2003   NO ISCHEMIA / EF 61%/ NORMAL LE WALL MOTION  . EXCISION LEFT WRIST GANGLIAN/ MYXOID CYST  05-30-2009  . IR RADIOLOGIST EVAL & MGMT  02/01/2019  . 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 OTHER SURGICAL HISTORY     pulse laser for facial telangiectasias inthe past  . removal of skin cancer from forehead  10/2012   Dr. Renda Rolls  . TRANSTHORACIC ECHOCARDIOGRAM  10-17-2008   DR CRENSHAW   NORMAL LVF/ EF 60%/  MILDLY DILATED RIGHT ATRIUM/ VENTRICULE  . VARICOCELECTOMY  1991   Dr. Tresa Endo    Family History  Problem Relation Age of Onset  . Diabetes Mother   . Hypertension Mother   . Pancreatic cancer Mother   . Stroke Mother   . Kidney failure Father   . Hypertension Sister   . Healthy Son     Social History   Socioeconomic History  .  Marital status: Married    Spouse name: [name]  . Number of children: 2  . Years of education: Not on file  . Highest education level: Master's degree (e.g., MA, MS, MEng, MEd, MSW, MBA)  Occupational History    Comment: Consultant  Tobacco Use  . Smoking status: Never Smoker  . Smokeless tobacco: Never Used  Vaping Use  . Vaping Use: Never used  Substance and Sexual Activity  . Alcohol use: No    Alcohol/week: 0.0 standard drinks  . Drug use: No  . Sexual activity: Not on file  Other Topics Concern  . Not on file  Social History Narrative   Lives with wife in a one story home.  Has 2 sons.  Retired Veterinary surgeon.  Education: Masters degree.      05/09/19- Pt states that his balance is worse since last visit. He has not fallen but has come close on a few occassions.  The weakness on his right side is the same, he states.   Social Determinants of Health   Financial Resource Strain: Low Risk   . Difficulty of Paying Living Expenses: Not hard at all  Food Insecurity: Not on file  Transportation Needs: Not on file  Physical Activity: Not on file  Stress: Not on file  Social Connections: Not on file  Intimate Partner Violence: Not on file    Review of systems: Review of Systems  Constitutional: Negative for fever and chills.  HENT: Negative.   Eyes: Negative for blurred vision.  Respiratory: as per HPI  Cardiovascular: Negative for chest pain and palpitations.  Gastrointestinal: Negative for vomiting, diarrhea, blood per rectum. Genitourinary: Negative for dysuria, urgency, frequency and hematuria.  Musculoskeletal: Negative for myalgias, back pain and joint pain.  Skin: Negative for itching and rash.  Neurological: Negative for dizziness, tremors, focal weakness, seizures and loss of consciousness.  Endo/Heme/Allergies: Negative for environmental allergies.  Psychiatric/Behavioral: Negative for depression, suicidal ideas and hallucinations.  All other systems reviewed  and are negative.  Physical Exam: Blood pressure 122/62, pulse 71, temperature (!) 97.2 F (36.2 C), temperature source Oral, height 5\' 10"  (1.778 m), weight 191 lb 6.4 oz (86.8 kg), SpO2 92 %. Gen:      No acute distress HEENT:  EOMI, sclera anicteric Neck:     No masses; no thyromegaly Lungs:    Clear to auscultation bilaterally; normal respiratory effort CV:         Regular rate and rhythm; no murmurs Abd:      + bowel sounds; soft, non-tender; no palpable masses, no distension Ext:    No edema; adequate peripheral perfusion Skin:      Warm and dry; no rash Neuro: alert and oriented x 3 Psych: normal mood and affect  Data Reviewed: Imaging: CT chest 05/13/2015-large pulmonary AVM in the right lower lobe, mild atelectatic changes at the base  CTA 12/15/2018-stable AVM, atelectatic changes at the basis  CT chest 12/07/2020-stable pulmonary AVM in the right lower lobe, nonspecific atelectasis in the lower lungs.  Stable compared to 2016 I have reviewed the images personally.  PFTs: 11/22/2019 FVC 4 [89%], FEV1 2.7 [79%], F/F 68 Moderate obstruction  Labs:  Cardiac: Echo 06/06/2020- LVEF 65-70%, mild concentric LVH with grade 1 diastolic dysfunction.  Normal RV size and function.  Assessment:  Chronic cough Suspect upper airway cough, GERD.  May have a neurogenic component given response to Neurontin in the past Continue PPI twice daily, add Pepcid at night Continue Xyzal, saline nasal spray.  Avoid steroid nasal spray given history of HHT and epistaxis in the past Try chlorpheniramine over-the-counter antihistamine.  If he has persistent symptoms then consider initiation of Neurontin.  Will need to be careful given side effects with higher dose in the past  He does have some obstructive airway disease on spirometry in the past.  We'll try prednisone 40 mg a day and trial of Breo inhaler  Concern for ILD I have reviewed the CT which shows nonspecific  atelectasis/hypoventilatory changes which is stable from 2016.  Do not feel there is significant interstitial lung disease.  We'll continue monitoring  HHT with pulmonary AVM Stable on imaging.  His oxygenation is adequate.  No recent episodes of epistaxis or GI blood loss. No evidence of pulmonary hypertension on recent echocardiogram.  Plan/Recommendations: Continue Protonix twice daily, add Pepcid at night Prednisone for 7 days, trial of Breo Chlorpheniramine antihistamine, continue Xyzal, saline nasal spray. Continue codeine cough  syrup.  Marshell Garfinkel MD [facility] 12/11/2020, 10:08 AM  CC: Janith Lima, MD

## 2020-12-11 NOTE — Patient Instructions (Signed)
I have reviewed your CT scans which show very mild changes.  I'm not convinced that this is interstitial lung disease.  Findings have been stable since 2016  Suspected chronic cough is from acid reflux and postnasal drip Continue the Protonix twice daily Add Pepcid 20 mg at night  We'll give prednisone 40 mg a day for 7 days and sample of Breo 200 inhaler Continue the Xyzal and saline nasal spray He can use over-the-counter chlorpheniramine 4 to 8 mg 3 times daily for 1 week  If symptoms persist then we can consider readdition of Neurontin Follow-up in 1 to 2 months.

## 2020-12-17 ENCOUNTER — Telehealth: Payer: Self-pay | Admitting: Internal Medicine

## 2020-12-17 ENCOUNTER — Other Ambulatory Visit: Payer: Self-pay | Admitting: Pulmonary Disease

## 2020-12-17 ENCOUNTER — Other Ambulatory Visit: Payer: Self-pay | Admitting: Internal Medicine

## 2020-12-17 NOTE — Telephone Encounter (Signed)
1.Medication Requested: chlorpheniramine-HYDROcodone (TUSSIONEX PENNKINETIC ER) 10-8 MG/5ML SUER    2. Pharmacy (Name, Street, [place]): [phone] - [place], [place] - [address] AT [address] [facility] [address]  3. On Med List: yes   4. Last Visit with PCP: 11.4.21  5. Next visit date with PCP: 5.5.22   Agent: Please be advised that RX refills may take up to 3 business days. We ask that you follow-up with your pharmacy.

## 2020-12-18 NOTE — Progress Notes (Signed)
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  Admitted 5/16 with headache and right lower extremity weakness. CT of the head demonstrated a brain mass measuring 3 x 2 cm with midline shift and edema. This was a ring enhancing mass in the left basal ganglia.He underwent stereotactic biopsy by neurosurgery. He was diagnosed with a brain abscess. Notes from ID indicate his cultures grew out Peptostreptococcus sp. Patient was followed by infectious disease in the hospital and placed on vancomycin, Rocephin and Flagyl. This was to be continued for one month post discharge. He was also placed on Keppra for seizure prophylaxis. 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   Following DC, patient had an episode of elevated heart rate. EMS was called and he was noted to be in atrial flutter. This converted to sinus rhythm spontaneously. Patient placed on toprol. Event monitor showed slow atrial flutter versus ectopic atrial tachycardia.   Patient seen recently with syncope.  Event possibly vagally mediated.  Echocardiogram June 2021 showed vigorous LV function, mild left ventricular hypertrophy, grade 1 diastolic dysfunction, mild mitral regurgitation.  Monitor July 2021 showed sinus bradycardia, normal sinus rhythm and sinus tachycardia.  Since he was last seenhe continues to have occasional "dizzy spells".  But can last 15 seconds up to 2 to 3 hours.  He did have some of these when his monitor was in place.  He denies increased dyspnea, chest pain or syncope.  Current Outpatient Medications  Medication Sig Dispense Refill  . acetaminophen (TYLENOL) 325 MG tablet Take 650 mg by mouth every 6 (six) hours as needed for mild pain.    [name] amoxicillin (AMOXIL) 500 MG capsule SMARTSIG:4 Capsule(s) By Mouth PRN 4 capsule 3  . benzonatate  (TESSALON) 100 MG capsule Take 1 capsule (100 mg total) by mouth every 8 (eight) hours. 21 capsule 0  . chlorpheniramine-HYDROcodone (TUSSIONEX PENNKINETIC ER) 10-8 MG/5ML SUER Take 5 mLs by mouth every 12 (twelve) hours as needed for cough. 140 mL 0  . Cholecalciferol (VITAMIN D3) 2000 UNITS TABS Take 5,000 Units by mouth daily.     . clidinium-chlordiazePOXIDE (LIBRAX) 5-2.5 MG capsule Take 1 capsule by mouth 3 (three) times daily before meals. 90 capsule 3  . clobetasol ointment (TEMOVATE) 0.05 % Apply topically 2 (two) times daily.    [name] levocetirizine (XYZAL) 5 MG tablet Take 1 tablet (5 mg total) by mouth every evening. 90 tablet 1  . levothyroxine (SYNTHROID) 125 MCG tablet Take 1 tablet (125 mcg total) by mouth daily before breakfast. 90 tablet 1  . Magnesium 250 MG TABS Take 250 mg by mouth at bedtime.     . meclizine (ANTIVERT) 25 MG tablet Take 25 mg by mouth 2 (two) times daily as needed for dizziness.    . methocarbamol (ROBAXIN) 500 MG tablet Take 500 mg by mouth every 6 (six) hours as needed.    . metoprolol succinate (TOPROL-XL) 25 MG 24 hr tablet Take 0.5 tablets (12.5 mg total) by mouth daily. 45 tablet 1  . montelukast (SINGULAIR) 10 MG tablet Take 1 tablet (10 mg total) by mouth at bedtime. 90 tablet 1  . Multiple Vitamins-Minerals (MULTI FOR HIM 50+) TABS Take 1 tablet by mouth daily.    . pantoprazole (PROTONIX) 40 MG tablet TAKE 1 TABLET BY MOUTH  TWICE DAILY 180 tablet 1  . predniSONE (DELTASONE) 20 MG tablet  Take 1 tablet (20 mg total) by mouth daily with breakfast. Take 2 tabs daily for 7 days 14 tablet 0  . silodosin (RAPAFLO) 8 MG CAPS capsule Take 1 capsule (8 mg total) by mouth at bedtime. TAKE ONE CAPSULE BY MOUTH DAILY AT BEDTIME 90 capsule 1   No current facility-administered medications for this visit.     Past Medical History:  Diagnosis Date  . Anxiety state, unspecified   . Arthritis   . Benign neoplasm of colon   . GERD (gastroesophageal reflux disease)    . 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 History of nonmelanoma skin cancer EXCISION SQUAMOUS CELL FROM HAND  . Hypertrophy of prostate with urinary obstruction and other lower urinary tract symptoms (LUTS)   . Irritable bowel syndrome   . Lumbago   . Other diseases of nasal cavity and sinuses(478.19)   . Persistent disorder of initiating or maintaining sleep   . Plantar fascial fibromatosis   . Pure hypercholesterolemia   . 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 Unspecified hypothyroidism     Past Surgical History:  Procedure Laterality Date  . APPLICATION OF CRANIAL NAVIGATION N/A 05/16/2015   Procedure: APPLICATION OF CRANIAL NAVIGATION;  Surgeon: Consuella Lose, MD;  Location: [facility];  Service: Neurosurgery;  Laterality: N/A;  . BRAIN BIOPSY Left 05/16/2015   Procedure: Stereotactic Left Brain Biopsy with Brain Lab;  Surgeon: Consuella Lose, MD;  Location: [facility];  Service: Neurosurgery;  Laterality: Left;  Stereotactic Left Brain biopsy with brainlab  . BRAIN SURGERY    . CARDIOVASCULAR STRESS TEST  01-14-2003   NO ISCHEMIA / EF 61%/ NORMAL LE WALL MOTION  . EXCISION LEFT WRIST GANGLIAN/ MYXOID CYST  05-30-2009  . IR RADIOLOGIST EVAL & MGMT  02/01/2019  . 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 OTHER SURGICAL HISTORY     pulse laser for facial telangiectasias inthe past  . removal of skin cancer from forehead  10/2012   Dr. Renda Rolls  . TRANSTHORACIC ECHOCARDIOGRAM  10-17-2008   DR Seferino Oscar   NORMAL LVF/ EF 60%/  MILDLY DILATED RIGHT ATRIUM/ VENTRICULE  . VARICOCELECTOMY  1991   Dr. Tresa Endo    Social History   Socioeconomic History  . Marital status: Married    Spouse name: [name]  . Number of children: 2  . Years of education: Not on file   . Highest education level: Master's degree (e.g., MA, MS, MEng, MEd, MSW, MBA)  Occupational History    Comment: Consultant  Tobacco Use  . Smoking status: Never Smoker  . Smokeless tobacco: Never Used  Vaping Use  . Vaping Use: Never used  Substance and Sexual Activity  . Alcohol use: No    Alcohol/week: 0.0 standard drinks  . Drug use: No  . Sexual activity: Not on file  Other Topics Concern  . Not on file  Social History Narrative   Lives with wife in a one story home.  Has 2 sons.  Retired Veterinary surgeon.  Education: Masters degree.      05/09/19- Pt states that his balance is worse since last visit. He has not fallen but has come close on a few occassions. The weakness on his right side is the same, he states.   Social  Determinants of Health   Financial Resource Strain: Low Risk   . Difficulty of Paying Living Expenses: Not hard at all  Food Insecurity: Not on file  Transportation Needs: Not on file  Physical Activity: Not on file  Stress: Not on file  Social Connections: Not on file  Intimate Partner Violence: Not on file    Family History  Problem Relation Age of Onset  . Diabetes Mother   . Hypertension Mother   . Pancreatic cancer Mother   . Stroke Mother   . Kidney failure Father   . Hypertension Sister   . Healthy Son     ROS: no fevers or chills, productive cough, hemoptysis, dysphasia, odynophagia, melena, hematochezia, dysuria, hematuria, rash, seizure activity, orthopnea, PND, pedal edema, claudication. Remaining systems are negative.  Physical Exam: Well-developed well-nourished in no acute distress.  Skin is warm and dry.  HEENT is normal.  Neck is supple.  Chest is clear to auscultation with normal expansion.  Cardiovascular exam is regular rate and rhythm.  Abdominal exam nontender or distended. No masses palpated. Extremities show no edema. neuro grossly intact  ECG-sinus rhythm, no ST changes personally reviewed  A/P  1  syncope/presyncope-previous episode may have been vagal in etiology.  LV function is normal.  His monitor was unremarkable and he did have symptoms with monitoring in place.  Symptoms do not appear to be cardiac.  2 history of atrial flutter versus ectopic atrial tachycardia-he remains in sinus rhythm.  Continue low-dose Toprol.  We have elected not to anticoagulate given history of Osler-Weber-Rendu with AV malformation/GI bleeding and prior brain abscess.  3 history of orthostasis-continue to maintain hydration and increase sodium intake.  4 hyperlipidemia-Per primary care.  Kirk Ruths, MD

## 2020-12-21 ENCOUNTER — Other Ambulatory Visit: Payer: Self-pay | Admitting: Internal Medicine

## 2020-12-21 DIAGNOSIS — R053 Chronic cough: Secondary | ICD-10-CM

## 2020-12-21 MED ORDER — HYDROCOD POLST-CPM POLST ER 10-8 MG/5ML PO SUER: 5.0000 mL | Freq: Two times a day (BID) | ORAL | 0 refills | Status: DC | PRN

## 2020-12-24 ENCOUNTER — Other Ambulatory Visit: Payer: Self-pay | Admitting: Internal Medicine

## 2020-12-24 DIAGNOSIS — K219 Gastro-esophageal reflux disease without esophagitis: Secondary | ICD-10-CM

## 2020-12-25 ENCOUNTER — Encounter: Payer: Self-pay | Admitting: Cardiology

## 2020-12-25 ENCOUNTER — Ambulatory Visit (INDEPENDENT_AMBULATORY_CARE_PROVIDER_SITE_OTHER): Payer: Medicare Other | Admitting: Cardiology

## 2020-12-25 ENCOUNTER — Other Ambulatory Visit: Payer: Self-pay

## 2020-12-25 VITALS — BP 130/78 | HR 69 | Temp 97.2°F | Ht 70.0 in | Wt 191.4 lb

## 2020-12-25 DIAGNOSIS — E7800 Pure hypercholesterolemia, unspecified: Secondary | ICD-10-CM

## 2020-12-25 DIAGNOSIS — I951 Orthostatic hypotension: Secondary | ICD-10-CM

## 2020-12-25 DIAGNOSIS — R55 Syncope and collapse: Secondary | ICD-10-CM

## 2020-12-25 DIAGNOSIS — I483 Typical atrial flutter: Secondary | ICD-10-CM | POA: Diagnosis not present

## 2020-12-25 NOTE — Patient Instructions (Signed)

## 2020-12-31 ENCOUNTER — Ambulatory Visit: Payer: Medicare Other | Admitting: Emergency Medicine

## 2021-01-15 ENCOUNTER — Encounter: Payer: Self-pay | Admitting: Pulmonary Disease

## 2021-01-15 ENCOUNTER — Other Ambulatory Visit: Payer: Self-pay

## 2021-01-15 ENCOUNTER — Ambulatory Visit (INDEPENDENT_AMBULATORY_CARE_PROVIDER_SITE_OTHER): Payer: Medicare Other | Admitting: Pulmonary Disease

## 2021-01-15 VITALS — BP 128/62 | HR 81 | Temp 97.1°F | Ht 70.0 in | Wt 192.2 lb

## 2021-01-15 DIAGNOSIS — R059 Cough, unspecified: Secondary | ICD-10-CM | POA: Diagnosis not present

## 2021-01-15 MED ORDER — GABAPENTIN 100 MG PO CAPS: 100.0000 mg | ORAL_CAPSULE | Freq: Two times a day (BID) | ORAL | 5 refills | Status: DC

## 2021-01-15 NOTE — Progress Notes (Signed)
Gregory [name]    [id number]    Aug 03, 1941  Primary Care Physician:Jones, Arvid Right, MD  Referring Physician: Janith Lima, MD [address] [place],  [place] [address]  Chief complaint:  Consult for  Chronic cough History of HHT with pulmonary AVM  HPI: 80 year old with history of hereditary hemorrhagic telangiectasia with pulmonary AVM, epistaxis.  Previously followed by Dr. Lenna Gilford  Complains of chronic cough for the past 2 years.  He was evaluated by ENT and placed on Neurontin with some improvement.  Then Neurontin dose was increased and had a syncopal episode.  This was thought to be cross-reaction with demeclocycline which she was on for hyponatremia.  He is currently off Neurontin  He has history of GERD for which he takes Protonix 40 mg twice daily, recurrent epistaxis and underwent multiple no surgeries in the past.  Has allergies, postnasal drip.  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  He was recently evaluated in the ED with CT showing mild interstitial changes at the base and has been referred for further evaluation.  Pets: No pets Occupation: Retired direction of purchase for a [employer] Exposures: No mold, hot tub, Jacuzzi.  No feather pillows or comforters Smoking history: Never smoker Travel history: No significant travel history Relevant family history: No family history of lung disease  Interim history:  Patient presents today for follow up of his chronic cough. He states he continues to have a daily cough, however it has improved since his last visit. Cough is usually worse at night. He also states he gets SOB mostly when climbing a flight of stares. He thinks this is worsened due to the skin graft in his left nare which is now completely blocking the left nostril. He does not think Breo helped, he has not been using this. He states in the past gabapentin seemed to help.    Outpatient Encounter Medications as of 01/15/2021   Medication Sig  . acetaminophen (TYLENOL) 325 MG tablet Take 650 mg by mouth every 6 (six) hours as needed for mild pain.  [name] amoxicillin (AMOXIL) 500 MG capsule SMARTSIG:4 Capsule(s) By Mouth PRN  . benzonatate (TESSALON) 100 MG capsule Take 1 capsule (100 mg total) by mouth every 8 (eight) hours.  . chlorpheniramine-HYDROcodone (TUSSIONEX PENNKINETIC ER) 10-8 MG/5ML SUER Take 5 mLs by mouth every 12 (twelve) hours as needed for cough.  . Cholecalciferol (VITAMIN D3) 2000 UNITS TABS Take 5,000 Units by mouth daily.   . clidinium-chlordiazePOXIDE (LIBRAX) 5-2.5 MG capsule Take 1 capsule by mouth 3 (three) times daily before meals.  . clobetasol ointment (TEMOVATE) 0.05 % Apply topically 2 (two) times daily.  [name] dextromethorphan (DELSYM) 30 MG/5ML liquid Take by mouth as needed for cough.  . Dextromethorphan-guaiFENesin (ROBITUSSIN DM PO) Take by mouth. Taking cough gel as needed  . levocetirizine (XYZAL) 5 MG tablet Take 1 tablet (5 mg total) by mouth every evening.  [name] levothyroxine (SYNTHROID) 125 MCG tablet Take 1 tablet (125 mcg total) by mouth daily before breakfast.  . Magnesium 250 MG TABS Take 250 mg by mouth at bedtime.   . meclizine (ANTIVERT) 25 MG tablet Take 25 mg by mouth 2 (two) times daily as needed for dizziness.  . methocarbamol (ROBAXIN) 500 MG tablet Take 500 mg by mouth every 6 (six) hours as needed.  . metoprolol succinate (TOPROL-XL) 25 MG 24 hr tablet Take 0.5 tablets (12.5 mg total) by mouth daily.  [name]  montelukast (SINGULAIR) 10 MG tablet Take 1 tablet (10 mg total) by mouth at bedtime.  . Multiple Vitamins-Minerals (MULTI FOR HIM 50+) TABS Take 1 tablet by mouth daily.  . pantoprazole (PROTONIX) 40 MG tablet TAKE 1 TABLET BY MOUTH  TWICE DAILY  . silodosin (RAPAFLO) 8 MG CAPS capsule Take 1 capsule (8 mg total) by mouth at bedtime. TAKE ONE CAPSULE BY MOUTH DAILY AT BEDTIME  . [DISCONTINUED] predniSONE (DELTASONE) 20 MG tablet Take 1 tablet (20 mg total) by mouth daily  with breakfast. Take 2 tabs daily for 7 days   No facility-administered encounter medications on file as of 01/15/2021.    Allergies as of 01/15/2021 - Review Complete 01/15/2021  Allergen Reaction Noted  . Nsaids Other (See Comments) 07/02/2015  . Other Other (See Comments) 07/02/2015  . Aspirin Other (See Comments) 05/13/2015  . Statins Other (See Comments) 05/13/2015  . Sulfa antibiotics  12/11/2020  . Tizanidine hcl Other (See Comments) 04/22/2020    Past Medical History:  Diagnosis Date  . Anxiety state, unspecified   . Arthritis   . Benign neoplasm of colon   . GERD (gastroesophageal reflux disease)   . 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 History of nonmelanoma skin cancer EXCISION SQUAMOUS CELL FROM HAND  . Hypertrophy of prostate with urinary obstruction and other lower urinary tract symptoms (LUTS)   . Irritable bowel syndrome   . Lumbago   . Other diseases of nasal cavity and sinuses(478.19)   . Persistent disorder of initiating or maintaining sleep   . Plantar fascial fibromatosis   . Pure hypercholesterolemia   . 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 Unspecified hypothyroidism     Past Surgical History:  Procedure Laterality Date  . APPLICATION OF CRANIAL NAVIGATION N/A 05/16/2015   Procedure: APPLICATION OF CRANIAL NAVIGATION;  Surgeon: Consuella Lose, MD;  Location: [facility];  Service: Neurosurgery;  Laterality: N/A;  . BRAIN BIOPSY Left 05/16/2015   Procedure: Stereotactic Left Brain Biopsy with Brain Lab;  Surgeon: Consuella Lose, MD;  Location: [facility];  Service: Neurosurgery;  Laterality: Left;  Stereotactic Left Brain biopsy with brainlab  . BRAIN SURGERY    . CARDIOVASCULAR STRESS TEST  01-14-2003   NO ISCHEMIA / EF 61%/ NORMAL LE WALL MOTION  . EXCISION LEFT WRIST GANGLIAN/  MYXOID CYST  05-30-2009  . IR RADIOLOGIST EVAL & MGMT  02/01/2019  . 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 OTHER SURGICAL HISTORY     pulse laser for facial telangiectasias inthe past  . removal of skin cancer from forehead  10/2012   Dr. Renda Rolls  . TRANSTHORACIC ECHOCARDIOGRAM  10-17-2008   DR CRENSHAW   NORMAL LVF/ EF 60%/  MILDLY DILATED RIGHT ATRIUM/ VENTRICULE  . VARICOCELECTOMY  1991   Dr. Tresa Endo    Family History  Problem Relation Age of Onset  . Diabetes Mother   . Hypertension Mother   . Pancreatic cancer Mother   . Stroke Mother   . Kidney failure Father   . Hypertension Sister   . Healthy Son     Social History   Socioeconomic History  . Marital status: Married    Spouse name: [name]  . Number of children: 2  . Years of education: Not on  file  . Highest education level: Master's degree (e.g., MA, MS, MEng, MEd, MSW, MBA)  Occupational History    Comment: Consultant  Tobacco Use  . Smoking status: Never Smoker  . Smokeless tobacco: Never Used  Vaping Use  . Vaping Use: Never used  Substance and Sexual Activity  . Alcohol use: No    Alcohol/week: 0.0 standard drinks  . Drug use: No  . Sexual activity: Not on file  Other Topics Concern  . Not on file  Social History Narrative   Lives with wife in a one story home.  Has 2 sons.  Retired Veterinary surgeon.  Education: Masters degree.      05/09/19- Pt states that his balance is worse since last visit. He has not fallen but has come close on a few occassions. The weakness on his right side is the same, he states.   Social Determinants of Health   Financial Resource Strain: Low Risk   . Difficulty of Paying Living Expenses: Not hard at all  Food Insecurity: Not on file  Transportation Needs: Not on file  Physical Activity: Not on file  Stress: Not on file  Social Connections: Not on file  Intimate Partner Violence: Not on file    Review of  systems: Review of Systems  Constitutional: Negative for fever and chills.  HENT: Negative.   Eyes: Negative for blurred vision.  Respiratory: as per HPI  Cardiovascular: Negative for chest pain and palpitations.  Gastrointestinal: Negative for vomiting, diarrhea, blood per rectum. Genitourinary: Negative for dysuria, urgency, frequency and hematuria.  Musculoskeletal: Negative for myalgias, back pain and joint pain.  Skin: Negative for itching and rash.  Neurological: Negative for dizziness, tremors, focal weakness, seizures and loss of consciousness.  Endo/Heme/Allergies: Negative for environmental allergies.  Psychiatric/Behavioral: Negative for depression, suicidal ideas and hallucinations.  All other systems reviewed and are negative.  Physical Exam: Blood pressure 122/62, pulse 71, temperature (!) 97.2 F (36.2 C), temperature source Oral, height 5\' 10"  (1.778 m), weight 191 lb 6.4 oz (86.8 kg), SpO2 92 %. Gen:      No acute distress HEENT:  EOMI, sclera anicteric Neck:     No masses; no thyromegaly Lungs:    Clear to auscultation bilaterally; normal respiratory effort CV:         Regular rate and rhythm; no murmurs Abd:      + bowel sounds; soft, non-tender; no palpable masses, no distension Ext:    No edema; adequate peripheral perfusion Skin:      Warm and dry; no rash Neuro: alert and oriented x 3 Psych: normal mood and affect  Data Reviewed: Imaging: CT chest 05/13/2015-large pulmonary AVM in the right lower lobe, mild atelectatic changes at the base  CTA 12/15/2018-stable AVM, atelectatic changes at the basis  CT chest 12/07/2020-stable pulmonary AVM in the right lower lobe, nonspecific atelectasis in the lower lungs.  Stable compared to 2016 I have reviewed the images personally.  PFTs: 11/22/2019 FVC 4 [89%], FEV1 2.7 [79%], F/F 68 Moderate obstruction  Labs:  Cardiac: Echo 06/06/2020- LVEF 65-70%, mild concentric LVH with grade 1 diastolic dysfunction.   Normal RV size and function.  Assessment:  Chronic cough Suspect upper airway cough, GERD.  May have a neurogenic component given response to Neurontin in the past Plan to resume Neurontin, careful titration of dose given side effects on higher doses in the past   Continue PPI twice daily, Pepcid at night Continue Xyzal, saline nasal spray and chlorpheniramine.  Avoid  steroid nasal spray given history of HHT and epistaxis in the past Can discontinue Breo as this has not helped symptoms  Concern for ILD I have reviewed the CT which shows nonspecific atelectasis/hypoventilatory changes which is stable from 2016.  Do not feel there is significant interstitial lung disease.  Continue monitoring  HHT with pulmonary AVM Stable on imaging.  His oxygenation is adequate.  No recent episodes of epistaxis or GI blood loss. No evidence of pulmonary hypertension on previous  echocardiogram.  Plan/Recommendations: Restart Neurontin 100 mg twice a day Continue Protonix twice daily, Pepcid nightly, chlorpheniramine, Xyzal, and saline nasal spray. Continue codeine cough syrup.   Harlow Ohms, DO PGY-2 IM   [facility] 01/15/2021, 2:26 PM  CC: Janith Lima, MD

## 2021-01-15 NOTE — Patient Instructions (Signed)
I am glad that your cough is better We will start Neurontin 100 mg twice daily for chronic neurogenic cough Follow-up in 6 months

## 2021-01-16 ENCOUNTER — Encounter: Payer: Self-pay | Admitting: Pulmonary Disease

## 2021-01-16 NOTE — Progress Notes (Signed)
Patient ID: EZEKIAL ARNS, male   DOB: 1941-04-23, 80 y.o.   MRN: [id number]  Attending note:  I have seen and examined the patient. History, labs and imaging reviewed.  Agree with assessment and plan as noted by Dr. Laural Golden.     Marshell Garfinkel MD  Pulmonary & Critical care 01/16/2021, 10:39 AM

## 2021-01-29 ENCOUNTER — Telehealth: Payer: Self-pay | Admitting: Pharmacist

## 2021-01-29 NOTE — Chronic Care Management (AMB) (Signed)
Chronic Care Management Pharmacy Assistant   Name: Gregory [name]  MRN: [id number] DOB: 1941-08-21  Reason for Encounter: Disease State / Hyperlipidemia Adherence Call  Patient Questions:  1.  Have you seen any other providers since your last visit? 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  01/15/2021 OV Pulmonology, Resident Rehman, Areeg, N, DO - Restart Neurontin 100 mg twice a day, Continue Protonix twice daily, Pepcid nightly, chlorpheniramine, Xyzal, and saline nasal spray. Continue codeine cough syrup.   2.  Any changes in your medicines or health? Recently restarted Gabapentin 100 mg twice daily. Patient denies any recent changes to his health.  PCP : Janith Lima, MD  Allergies:   Allergies  Allergen Reactions   Nsaids Other (See Comments)    Cannot take-per MD   Other Other (See Comments)    All blood thinners-cannot take per MD   Aspirin Other (See Comments)    nosebleeds   Statins Other (See Comments)    Weakness, myalgias   Sulfa Antibiotics    Tizanidine Hcl Other (See Comments)    Patient stated that after taking this medication he experienced feelings of dizziness and disorientation.     Medications: Outpatient Encounter Medications as of 01/29/2021  Medication Sig    acetaminophen (TYLENOL) 325 MG tablet Take 650 mg by mouth every 6 (six) hours as needed for mild pain.   amoxicillin (AMOXIL) 500 MG capsule SMARTSIG:4 Capsule(s) By Mouth PRN   benzonatate (TESSALON) 100 MG capsule Take 1 capsule (100 mg total) by mouth every 8 (eight) hours.   chlorpheniramine-HYDROcodone (TUSSIONEX PENNKINETIC ER) 10-8 MG/5ML SUER Take 5 mLs by mouth every 12 (twelve) hours as needed for cough.   Cholecalciferol (VITAMIN D3) 2000 UNITS TABS Take 5,000 Units by mouth daily.    clidinium-chlordiazePOXIDE (LIBRAX) 5-2.5 MG capsule Take 1 capsule by mouth 3 (three) times daily before meals.   clobetasol ointment (TEMOVATE) 0.05 % Apply topically 2 (two) times daily.   dextromethorphan (DELSYM) 30 MG/5ML liquid Take by mouth as needed for cough.   Dextromethorphan-guaiFENesin (ROBITUSSIN DM PO) Take by mouth. Taking cough gel as needed   gabapentin (NEURONTIN) 100 MG capsule Take 1 capsule (100 mg total) by mouth 2 (two) times daily.   levocetirizine (XYZAL) 5 MG tablet Take 1 tablet (5 mg total) by mouth every evening.   levothyroxine (SYNTHROID) 125 MCG tablet Take 1 tablet (125 mcg total) by mouth daily before breakfast.   Magnesium 250 MG TABS Take 250 mg by mouth at bedtime.    meclizine (ANTIVERT) 25 MG tablet Take 25 mg by mouth 2 (two) times daily as needed for dizziness.   methocarbamol (ROBAXIN) 500 MG tablet Take 500 mg by mouth every 6 (six) hours as needed.   metoprolol succinate (TOPROL-XL) 25 MG 24 hr tablet Take 0.5 tablets (12.5 mg total) by mouth daily.   montelukast (SINGULAIR) 10 MG tablet Take 1 tablet (10  mg total) by mouth at bedtime.   Multiple Vitamins-Minerals (MULTI FOR HIM 50+) TABS Take 1 tablet by mouth daily.   pantoprazole (PROTONIX) 40 MG tablet TAKE 1 TABLET BY MOUTH  TWICE DAILY   silodosin (RAPAFLO) 8 MG CAPS capsule Take 1 capsule (8 mg total) by mouth at bedtime. TAKE ONE CAPSULE BY MOUTH DAILY AT BEDTIME   No  facility-administered encounter medications on file as of 01/29/2021.    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   Seasonal allergic rhinitis due to pollen 06/27/2018   Peripheral neuropathy 01/01/2016   Atrial flutter ([place]) 08/07/2015   Encounter for therapeutic drug monitoring    Osteoarthritis 04/01/2015   Iron deficiency anemia due to chronic blood loss 10/08/2014   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 year old male who is a primary care patient of Janith Lima, MD.  Comprehensive medication review performed; Spoke to patient regarding cholesterol  Lipid Panel    Component Value Date/Time   CHOL 140 10/23/2020 1050   TRIG 62.0 10/23/2020 1050   HDL 46.30 10/23/2020 1050   LDLCALC 81 10/23/2020 1050   LDLDIRECT 139.4 06/15/2007 0946    10-year ASCVD risk score: The 10-year ASCVD risk score Mikey Bussing DC Brooke Bonito., et al., 2013) is: 27.8%   Values used to calculate the score:     Age: 80 years     Sex: Male     Is Non-Hispanic African American:  No     Diabetic: No     Tobacco smoker: No     Systolic Blood Pressure: 937 mmHg     Is BP treated: No     HDL Cholesterol: 46.3 mg/dL     Total Cholesterol: 140 mg/dL   Current antihyperlipidemic regimen:  o None at this time.   Previous antihyperlipidemic medications tried: simvastatin and rosuvastatin   ASCVD risk enhancing conditions: age >54 , atrial flutter   What recent interventions/DTPs have been made by any provider to improve Cholesterol control since last CPP Visit: None noted.   Any recent hospitalizations or ED visits since last visit with CPP? Yes , 12/07/2020 ED - Interstitial lung disease, discharged with tessalon pearls, follow up with pulmonology.   What diet changes have been made to improve Cholesterol?  o Patient states he is not currently following any diet. Patient states he was told to increase his sodium intake due to orthostasis.   What exercise is being done to improve Cholesterol?  o Patient states he is not exercising at this time.  Adherence Review: Does the patient have >5 day gap between last estimated fill dates? No  Patient did note during the time of this call that his chronic cough has improved.  April D Calhoun, [place] Pharmacist Assistant [phone]   Follow-Up:  Pharmacist Review

## 2021-03-06 ENCOUNTER — Other Ambulatory Visit: Payer: Self-pay | Admitting: Internal Medicine

## 2021-03-06 DIAGNOSIS — J4530 Mild persistent asthma, uncomplicated: Secondary | ICD-10-CM

## 2021-03-19 ENCOUNTER — Encounter: Payer: Self-pay | Admitting: Internal Medicine

## 2021-03-19 ENCOUNTER — Other Ambulatory Visit: Payer: Self-pay

## 2021-03-19 ENCOUNTER — Ambulatory Visit (INDEPENDENT_AMBULATORY_CARE_PROVIDER_SITE_OTHER): Payer: Medicare Other | Admitting: Internal Medicine

## 2021-03-19 VITALS — BP 112/68 | HR 78 | Temp 98.1°F | Ht 70.0 in | Wt 191.0 lb

## 2021-03-19 DIAGNOSIS — I340 Nonrheumatic mitral (valve) insufficiency: Secondary | ICD-10-CM

## 2021-03-19 DIAGNOSIS — E222 Syndrome of inappropriate secretion of antidiuretic hormone: Secondary | ICD-10-CM

## 2021-03-19 DIAGNOSIS — R600 Localized edema: Secondary | ICD-10-CM

## 2021-03-19 DIAGNOSIS — R739 Hyperglycemia, unspecified: Secondary | ICD-10-CM | POA: Diagnosis not present

## 2021-03-19 DIAGNOSIS — D500 Iron deficiency anemia secondary to blood loss (chronic): Secondary | ICD-10-CM

## 2021-03-19 DIAGNOSIS — E039 Hypothyroidism, unspecified: Secondary | ICD-10-CM | POA: Diagnosis not present

## 2021-03-19 HISTORY — DX: Localized edema: R60.0

## 2021-03-19 LAB — CBC WITH DIFFERENTIAL/PLATELET
Basophils Absolute: 0 10*3/uL (ref 0.0–0.1)
Basophils Relative: 0.4 % (ref 0.0–3.0)
Eosinophils Absolute: 0 10*3/uL (ref 0.0–0.7)
Eosinophils Relative: 0.6 % (ref 0.0–5.0)
HCT: 28 % — ABNORMAL LOW (ref 39.0–52.0)
Hemoglobin: 9 g/dL — ABNORMAL LOW (ref 13.0–17.0)
Lymphocytes Relative: 27.8 % (ref 12.0–46.0)
Lymphs Abs: 1.9 10*3/uL (ref 0.7–4.0)
MCHC: 32.2 g/dL (ref 30.0–36.0)
MCV: 70.6 fl — ABNORMAL LOW (ref 78.0–100.0)
Monocytes Absolute: 0.9 10*3/uL (ref 0.1–1.0)
Monocytes Relative: 13.1 % — ABNORMAL HIGH (ref 3.0–12.0)
Neutro Abs: 3.9 10*3/uL (ref 1.4–7.7)
Neutrophils Relative %: 58.1 % (ref 43.0–77.0)
Platelets: 395 10*3/uL (ref 150.0–400.0)
RBC: 3.96 Mil/uL — ABNORMAL LOW (ref 4.22–5.81)
RDW: 16.3 % — ABNORMAL HIGH (ref 11.5–15.5)
WBC: 6.6 10*3/uL (ref 4.0–10.5)

## 2021-03-19 LAB — BASIC METABOLIC PANEL
BUN: 17 mg/dL (ref 6–23)
CO2: 28 mEq/L (ref 19–32)
Calcium: 9.3 mg/dL (ref 8.4–10.5)
Chloride: 99 mEq/L (ref 96–112)
Creatinine, Ser: 1.03 mg/dL (ref 0.40–1.50)
GFR: 69.62 mL/min (ref >60.00)
Glucose, Bld: 99 mg/dL (ref 70–99)
Potassium: 4.1 mEq/L (ref 3.5–5.1)
Sodium: 133 mEq/L — ABNORMAL LOW (ref 135–145)

## 2021-03-19 LAB — BRAIN NATRIURETIC PEPTIDE: Pro B Natriuretic peptide (BNP): 126 pg/mL — ABNORMAL HIGH (ref 0.0–100.0)

## 2021-03-19 LAB — D-DIMER, QUANTITATIVE: D-Dimer, Quant: 0.37 mcg/mL FEU (ref <0.50)

## 2021-03-19 LAB — TSH: TSH: 0.5 u[IU]/mL (ref 0.35–4.50)

## 2021-03-19 LAB — TROPONIN I (HIGH SENSITIVITY): High Sens Troponin I: 5 ng/L (ref 2–17)

## 2021-03-19 MED ORDER — TORSEMIDE 10 MG PO TABS: 10.0000 mg | ORAL_TABLET | Freq: Every day | ORAL | 1 refills | Status: DC

## 2021-03-19 NOTE — Patient Instructions (Signed)

## 2021-03-19 NOTE — Progress Notes (Signed)
Subjective:  Patient ID: Gregory [name], male    DOB: 1941-08-29  Age: 80 y.o. MRN: [id number]  CC: Anemia  This visit occurred during the SARS-CoV-2 public health emergency.  Safety protocols were in place, including screening questions prior to the visit, additional usage of staff PPE, and extensive cleaning of exam room while observing appropriate contact time as indicated for disinfecting solutions.    HPI Gregory [name] presents for f/up - He complains of a 3-week history of painless swelling in his lower extremities, worse on the right than the left.  He denies claudication, chest pain, shortness of breath, palpitations, or weight gain.  Outpatient Medications Prior to Visit  Medication Sig Dispense Refill  . acetaminophen (TYLENOL) 325 MG tablet Take 650 mg by mouth every 6 (six) hours as needed for mild pain.    [name] amoxicillin (AMOXIL) 500 MG capsule SMARTSIG:4 Capsule(s) By Mouth PRN 4 capsule 3  . Cholecalciferol (VITAMIN D3) 2000 UNITS TABS Take 5,000 Units by mouth daily.     . clidinium-chlordiazePOXIDE (LIBRAX) 5-2.5 MG capsule Take 1 capsule by mouth 3 (three) times daily before meals. 90 capsule 3  . clobetasol ointment (TEMOVATE) 0.05 % Apply topically 2 (two) times daily.    [name] dextromethorphan (DELSYM) 30 MG/5ML liquid Take by mouth as needed for cough.    . Dextromethorphan-guaiFENesin (ROBITUSSIN DM PO) Take by mouth. Taking cough gel as needed    . gabapentin (NEURONTIN) 100 MG capsule Take 1 capsule (100 mg total) by mouth 2 (two) times daily. 60 capsule 5  . levocetirizine (XYZAL) 5 MG tablet Take 1 tablet (5 mg total) by mouth every evening. 90 tablet 1  . levothyroxine (SYNTHROID) 125 MCG tablet Take 1 tablet (125 mcg total) by mouth daily before breakfast. 90 tablet 1  . Magnesium 250 MG TABS Take 250 mg by mouth at bedtime.     . meclizine (ANTIVERT) 25 MG tablet Take 25 mg by mouth 2 (two) times daily as needed for dizziness.    . methocarbamol (ROBAXIN) 500  MG tablet Take 500 mg by mouth every 6 (six) hours as needed.    . metoprolol succinate (TOPROL-XL) 25 MG 24 hr tablet Take 0.5 tablets (12.5 mg total) by mouth daily. 45 tablet 1  . montelukast (SINGULAIR) 10 MG tablet TAKE 1 TABLET BY MOUTH AT  BEDTIME 90 tablet 1  . Multiple Vitamins-Minerals (MULTI FOR HIM 50+) TABS Take 1 tablet by mouth daily.    . pantoprazole (PROTONIX) 40 MG tablet TAKE 1 TABLET BY MOUTH  TWICE DAILY 180 tablet 1  . silodosin (RAPAFLO) 8 MG CAPS capsule Take 1 capsule (8 mg total) by mouth at bedtime. TAKE ONE CAPSULE BY MOUTH DAILY AT BEDTIME 90 capsule 1  . benzonatate (TESSALON) 100 MG capsule Take 1 capsule (100 mg total) by mouth every 8 (eight) hours. 21 capsule 0  . chlorpheniramine-HYDROcodone (TUSSIONEX PENNKINETIC ER) 10-8 MG/5ML SUER Take 5 mLs by mouth every 12 (twelve) hours as needed for cough. 140 mL 0   No facility-administered medications prior to visit.    ROS Review of Systems  Constitutional: Negative for chills, diaphoresis, fatigue and fever.  HENT: Negative.   Eyes: Negative.   Respiratory: Negative for cough, chest tightness, shortness of breath and wheezing.   Cardiovascular: Positive for leg swelling. Negative for chest pain and palpitations.  Gastrointestinal: Negative for abdominal pain, constipation, diarrhea, nausea and vomiting.  Endocrine: Negative.   Genitourinary: Negative.  Negative for difficulty urinating.  Musculoskeletal:  Negative.   Skin: Negative.   Neurological: Negative.  Negative for dizziness, weakness, light-headedness and numbness.  Hematological: Negative for adenopathy. Does not bruise/bleed easily.  Psychiatric/Behavioral: Negative.     Objective:  BP 112/68   Pulse 78   Temp 98.1 F (36.7 C) (Oral)   Ht 5\' 10"  (1.778 m)   Wt 191 lb (86.6 kg)   SpO2 99%   BMI 27.41 kg/m   BP Readings from Last 3 Encounters:  03/19/21 112/68  01/15/21 128/62  12/25/20 130/78    Wt Readings from Last 3 Encounters:   03/19/21 191 lb (86.6 kg)  01/15/21 192 lb 3.2 oz (87.2 kg)  12/25/20 191 lb 6.4 oz (86.8 kg)    Physical Exam Vitals reviewed.  Constitutional:      Appearance: Normal appearance.  HENT:     Nose: Nose normal.     Mouth/Throat:     Mouth: Mucous membranes are moist.  Eyes:     General: No scleral icterus.    Conjunctiva/sclera: Conjunctivae normal.  Cardiovascular:     Rate and Rhythm: Normal rate and regular rhythm.     Pulses:          Carotid pulses are 1+ on the right side and 1+ on the left side.      Radial pulses are 1+ on the right side and 1+ on the left side.       Femoral pulses are 1+ on the right side and 1+ on the left side.      Popliteal pulses are 1+ on the right side and 1+ on the left side.       Dorsalis pedis pulses are 1+ on the right side and 1+ on the left side.       Posterior tibial pulses are 1+ on the right side and 1+ on the left side.     Heart sounds: S1 normal and S2 normal. Murmur heard.   Decrescendo systolic murmur is present with a grade of 1/6.  No diastolic murmur is present. No friction rub. No gallop.      Comments: EKG- NSR, 71 bpm LAE No LVH, Q waves, or ST/T wave changes No changes compared to the prior EKG Pulmonary:     Effort: Pulmonary effort is normal.     Breath sounds: No stridor. No wheezing, rhonchi or rales.  Abdominal:     General: Abdomen is flat.     Palpations: There is no mass.     Tenderness: There is no abdominal tenderness. There is no guarding.  Musculoskeletal:        General: No swelling.     Cervical back: Neck supple.     Right lower leg: 2+ Pitting Edema present.     Left lower leg: 1+ Pitting Edema present.  Skin:    General: Skin is warm and dry.     Coloration: Skin is not pale.  Neurological:     General: No focal deficit present.     Mental Status: He is alert.     Lab Results  Component Value Date   WBC 6.6 03/19/2021   HGB 9.0 (L) 03/19/2021   HCT 28.0 (L) 03/19/2021   PLT 395.0  03/19/2021   GLUCOSE 99 03/19/2021   CHOL 140 10/23/2020   TRIG 62.0 10/23/2020   HDL 46.30 10/23/2020   LDLDIRECT 139.4 06/15/2007   LDLCALC 81 10/23/2020   ALT 12 04/24/2019   AST 15 04/24/2019   NA 133 (L) 03/19/2021  K 4.1 03/19/2021   CL 99 03/19/2021   CREATININE 1.03 03/19/2021   BUN 17 03/19/2021   CO2 28 03/19/2021   TSH 0.50 03/19/2021   PSA 1.77 04/19/2018   INR 1.16 05/13/2015   HGBA1C 6.3 10/23/2020    CT Chest W Contrast  Result Date: 12/07/2020 CLINICAL DATA:  Cough and worsening dyspnea for a few days. Abnormal chest radiograph with interstitial prominence. 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 EXAM: CT CHEST WITH CONTRAST TECHNIQUE: Multidetector CT imaging of the chest was performed during intravenous contrast administration. CONTRAST:  20mL OMNIPAQUE IOHEXOL 300 MG/ML  SOLN COMPARISON:  Chest radiograph from earlier today. 12/15/2018 chest CT angiogram. FINDINGS: Cardiovascular: Normal heart size. No significant pericardial effusion/thickening. Atherosclerotic nonaneurysmal thoracic aorta. Normal caliber pulmonary arteries. No central pulmonary emboli. Mediastinum/Nodes: Thyroid is either atrophic or surgically absent. Unremarkable esophagus. No pathologically enlarged axillary, mediastinal or hilar lymph nodes. Lungs/Pleura: No pneumothorax. No pleural effusion. No acute consolidative airspace disease. 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 Tiny 0.5 cm pulmonary AVM in the posterior left lower lobe (series 3/image 117), previously 0.5 cm, stable. No new significant pulmonary nodules or pulmonary AVMs. Mild patchy subpleural reticulation throughout both lungs without significant regions of traction bronchiectasis, architectural distortion or frank honeycombing, not substantially changed. Upper abdomen: No acute abnormality. Subcentimeter  hypodense inferior left liver lesion is too small to characterize and unchanged, considered benign. Musculoskeletal: No aggressive appearing focal osseous lesions. Mild thoracic spondylosis. IMPRESSION: 1. Stable chest CT since 12/15/2018.  No acute pulmonary disease. 2. 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 3. Nonspecific stable mild patchy subpleural reticulation throughout both lungs, with the differential including hypoventilatory change, mild nonspecific postinfectious/postinflammatory scarring or indolent interstitial lung disease including early UIP (less favored). High-resolution chest CT follow-up could be obtained in 12 months to assess temporal pattern stability, as clinically warranted. 4. Aortic Atherosclerosis (ICD10-I70.0). Electronically Signed   By: Ilona Sorrel M.D.   On: 12/07/2020 15:57   DG Chest Port 1 View  Result Date: 12/07/2020 CLINICAL DATA:  Cough. EXAM: PORTABLE CHEST 1 VIEW COMPARISON:  October 23, 2020. FINDINGS: The heart size and mediastinal contours are within normal limits. There is new diffuse interstitial prominence. No focal consolidation. No visible pleural effusions or pneumothorax. Biapical pleuroparenchymal scarring. Redemonstrated nodular opacity in the right lower lung, compatible with an AVM that was better characterized on prior CTA chest from May 14, 2015. IMPRESSION: 1. New diffuse interstitial prominence, which could represent interstitial edema or atypical infection. 2. Redemonstrated nodular opacity in the right lower lung, compatible with an AVM that was better characterized on prior CTA chest from May 14, 2015. Electronically Signed   By: Margaretha Sheffield MD   On: 12/07/2020 13:57    Assessment & Plan:   Gregory [name] was seen today for anemia.  Diagnoses and all orders for this visit:  SIADH (syndrome of inappropriate ADH production) (HCC)-his sodium  level remains mildly low.  We will start a loop diuretic. -     Basic metabolic panel; Future -     Basic metabolic panel -     torsemide (DEMADEX) 10 MG tablet; Take 1 tablet (10 mg total) by mouth daily.  Acquired hypothyroidism- His TSH is in the normal range.  He will  stay on the current dose of levothyroxine. -     TSH; Future -     TSH  Hyperglycemia -     Basic metabolic panel; Future -     Basic metabolic panel  Iron deficiency anemia due to chronic blood loss- His H&H are stable. -     CBC with Differential/Platelet; Future -     CBC with Differential/Platelet  Nonrheumatic mitral valve regurgitation- See below. -     EKG 12-Lead -     D-dimer, quantitative; Future -     Troponin I (High Sensitivity); Future -     Brain natriuretic peptide; Future -     Brain natriuretic peptide -     Troponin I (High Sensitivity) -     D-dimer, quantitative  Bilateral leg edema- His EKG is reassuring.  His D-dimer is normal.  The only remarkable finding on the labs is a stable anemia and slight elevation in the BNP.  I will treat him for a mild fluid retention with a loop diuretic. -     EKG 12-Lead -     D-dimer, quantitative; Future -     Troponin I (High Sensitivity); Future -     Brain natriuretic peptide; Future -     Basic metabolic panel; Future -     Basic metabolic panel -     Brain natriuretic peptide -     Troponin I (High Sensitivity) -     D-dimer, quantitative -     torsemide (DEMADEX) 10 MG tablet; Take 1 tablet (10 mg total) by mouth daily.   I have discontinued [name] "[name]"'s benzonatate and chlorpheniramine-HYDROcodone. I am also having him start on torsemide. Additionally, I am having him maintain his Magnesium, Vitamin D3, levocetirizine, Multi For Him 50+, acetaminophen, meclizine, clobetasol ointment, methocarbamol, metoprolol succinate, amoxicillin, silodosin, clidinium-chlordiazePOXIDE, levothyroxine, pantoprazole, Dextromethorphan-guaiFENesin  (ROBITUSSIN DM PO), dextromethorphan, gabapentin, and montelukast.  Meds ordered this encounter  Medications  . torsemide (DEMADEX) 10 MG tablet    Sig: Take 1 tablet (10 mg total) by mouth daily.    Dispense:  90 tablet    Refill:  1     Follow-up: Return in about 1 week (around 03/26/2021).  Scarlette Calico, MD

## 2021-03-31 ENCOUNTER — Telehealth: Payer: Self-pay | Admitting: Pharmacist

## 2021-04-02 NOTE — Progress Notes (Addendum)
Chronic Care Management Pharmacy Assistant   Name: Gregory [name]  MRN: [id number] DOB: [birth year]   Reason for Encounter: Cholesterol Disease State Call   Conditions to be addressed/monitored: HLD   Recent office visits:  03/19/21 Dr. Scarlette Calico, ordered Torsemide 10 mg daily  Recent consult visits:  None ID  Hospital visits:  None in previous 6 months  Medications: Outpatient Encounter Medications as of 03/31/2021  Medication Sig   acetaminophen (TYLENOL) 325 MG tablet Take 650 mg by mouth every 6 (six) hours as needed for mild pain.   amoxicillin (AMOXIL) 500 MG capsule SMARTSIG:4 Capsule(s) By Mouth PRN   Cholecalciferol (VITAMIN D3) 2000 UNITS TABS Take 5,000 Units by mouth daily.    clidinium-chlordiazePOXIDE (LIBRAX) 5-2.5 MG capsule Take 1 capsule by mouth 3 (three) times daily before meals.   clobetasol ointment (TEMOVATE) 0.05 % Apply topically 2 (two) times daily.   dextromethorphan (DELSYM) 30 MG/5ML liquid Take by mouth as needed for cough.   Dextromethorphan-guaiFENesin (ROBITUSSIN DM PO) Take by mouth. Taking cough gel as needed   gabapentin (NEURONTIN) 100 MG capsule Take 1 capsule (100 mg total) by mouth 2 (two) times daily.   levocetirizine (XYZAL) 5 MG tablet Take 1 tablet (5 mg total) by mouth every evening.   levothyroxine (SYNTHROID) 125 MCG tablet Take 1 tablet (125 mcg total) by mouth daily before breakfast.   Magnesium 250 MG TABS Take 250 mg by mouth at bedtime.    meclizine (ANTIVERT) 25 MG tablet Take 25 mg by mouth 2 (two) times daily as needed for dizziness.   methocarbamol (ROBAXIN) 500 MG tablet Take 500 mg by mouth every 6 (six) hours as needed.   metoprolol succinate (TOPROL-XL) 25 MG 24 hr tablet Take 0.5 tablets (12.5 mg total) by mouth daily.   montelukast (SINGULAIR) 10 MG tablet TAKE 1 TABLET BY MOUTH AT  BEDTIME   Multiple Vitamins-Minerals (MULTI FOR HIM 50+) TABS Take 1 tablet by mouth daily.   pantoprazole (PROTONIX) 40 MG  tablet TAKE 1 TABLET BY MOUTH  TWICE DAILY   silodosin (RAPAFLO) 8 MG CAPS capsule Take 1 capsule (8 mg total) by mouth at bedtime. TAKE ONE CAPSULE BY MOUTH DAILY AT BEDTIME   torsemide (DEMADEX) 10 MG tablet Take 1 tablet (10 mg total) by mouth daily.   No facility-administered encounter medications on file as of 03/31/2021.    04/02/2021 Name: Gregory [name] MRN: [id number] DOB: [birth year] Gregory [name] is a 80 y.o. year old male who is a primary care patient of Janith Lima, MD.  Comprehensive medication review performed; Spoke to patient regarding cholesterol  Lipid Panel    Component Value Date/Time   CHOL 140 10/23/2020 1050   TRIG 62.0 10/23/2020 1050   HDL 46.30 10/23/2020 1050   LDLCALC 81 10/23/2020 1050   LDLDIRECT 139.4 06/15/2007 0946    10-year ASCVD risk score: The 10-year ASCVD risk score Mikey Bussing DC Brooke Bonito., et al., 2013) is: 22.7%   Values used to calculate the score:     Age: 80 years     Sex: Male     Is Non-Hispanic African American: No     Diabetic: No     Tobacco smoker: No     Systolic Blood Pressure: 875 mmHg     Is BP treated: No     HDL Cholesterol: 46.3 mg/dL     Total Cholesterol: 140 mg/dL  Current antihyperlipidemic regimen: The patient states that he does not take any cholesterol medications  Previous antihyperlipidemic medications tried:simvastatin and rosuvastatin  ASCVD risk enhancing conditions:age >65 , atrial flutter  What recent interventions/DTPs have been made by any provider to improve Cholesterol control since last CPP Visit: None ID  Any recent hospitalizations or ED visits since last visit with CPP?12/07/2020 ED - Interstitial lung disease, discharged with tessalon pearls, follow up with pulmonology.   What diet changes have been made to improve Cholesterol? Patient states he is not currently following any diet.   What exercise is being done to improve Cholesterol? Patient states he is not exercising at this time.   Adherence  Review: Does the patient have >5 day gap between last estimated fill dates? No   Star Rating Drugs: None ID  [facility] Pharmacist Assistant [phone]

## 2021-04-06 ENCOUNTER — Telehealth: Payer: Self-pay | Admitting: Pulmonary Disease

## 2021-04-06 ENCOUNTER — Ambulatory Visit (INDEPENDENT_AMBULATORY_CARE_PROVIDER_SITE_OTHER): Payer: Medicare Other | Admitting: Neurology

## 2021-04-06 ENCOUNTER — Other Ambulatory Visit: Payer: Self-pay

## 2021-04-06 ENCOUNTER — Encounter: Payer: Self-pay | Admitting: Neurology

## 2021-04-06 VITALS — BP 130/72 | HR 68 | Ht 70.0 in | Wt 192.0 lb

## 2021-04-06 DIAGNOSIS — G620 Drug-induced polyneuropathy: Secondary | ICD-10-CM

## 2021-04-06 DIAGNOSIS — R2681 Unsteadiness on feet: Secondary | ICD-10-CM | POA: Diagnosis not present

## 2021-04-06 DIAGNOSIS — R4789 Other speech disturbances: Secondary | ICD-10-CM | POA: Diagnosis not present

## 2021-04-06 NOTE — Telephone Encounter (Signed)
Spoke with patient who states that he is wanting to establish care with Dr.Hunsucker being he was referred to see him by another physician. Did inform the pt that it would have to be approved by both doctors before the switch.   Dr Vaughan Browner and Dr Silas Flood are you both ok with patient changing from Dr Vaughan Browner to Dr Silas Flood?

## 2021-04-06 NOTE — Patient Instructions (Signed)
Try to start home exercises for balance.  Call my office, if you need a physical therapy referral  Return to clinic 1 year

## 2021-04-06 NOTE — Telephone Encounter (Signed)
Ok with me 

## 2021-04-06 NOTE — Telephone Encounter (Signed)
Will await response from Dr. Silas Flood prior to contacting patient.

## 2021-04-06 NOTE — Progress Notes (Signed)
Follow-up Visit   Date: 04/06/21   Gregory [name] MRN: [id number] DOB: 1941/01/06   Interim History: Gregory [name] is a 80 y.o. right-handed Caucasian male with hypothyroidism, hyperlipidemia, Osler-Weber- Rendu disease, GERD, and left external basal ganglia polymicrobial abscess (2016) returning to the clinic for follow-up of right sided spasticity and neuropathy.   The patient was accompanied to the clinic by self.  History of present illness: He was hospitalized from May 24 - May 21 2015 with right sided leg weakness and headaches. Patient was found to have a biopsy-proven brain abscess 3 x 2 cm involving the basal ganglia with midline shift and edema seen on CT head. IV antibiotics including vancomycin, rocephin, and flagyl was started. He was also started on keppra 500mg  BID. Of note, he had routine dental work 2 weeks prior to symptom onset.  He has completed his course of antibiotics therapy with IV for 6 weeks and transitioned to oral levofloxacin and metronidazole for 4 weeks.  Starting ~ late July 2016, he started noticing numbness/tingling of the toes and shooting pain involving the sole of the foot. Symptoms are constant and involve both feet equally. Shooting pain is worse with weight bearing. Nothing alleviates the pain. He endorses mild low back pain.  These symptoms were thought to be due to flagyl-induced neuropathy and slowly improved.   UPDATE 04/06/2021: He continues to have issues with imbalance, especially with turning.  Fortunately, he has not had any falls. He continues to walk unassisted.  He also has problems with remembering names.  No other memory issues.  He manages finances, medication, drives, and household chores.  Sometimes, he feels that his speech may mumble, especially if he talks fast.  No slurred speech. Last summer, he has a syncopal event at church, thought to be medication-induced.  He has not had any other events like this.  Numbness in the  feet is stable and unchanged.  Medications:  Current Outpatient Medications on File Prior to Visit  Medication Sig Dispense Refill  . acetaminophen (TYLENOL) 325 MG tablet Take 650 mg by mouth every 6 (six) hours as needed for mild pain.    [name] amoxicillin (AMOXIL) 500 MG capsule SMARTSIG:4 Capsule(s) By Mouth PRN 4 capsule 3  . Cholecalciferol (VITAMIN D3) 2000 UNITS TABS Take 5,000 Units by mouth daily.     . clidinium-chlordiazePOXIDE (LIBRAX) 5-2.5 MG capsule Take 1 capsule by mouth 3 (three) times daily before meals. 90 capsule 3  . clobetasol ointment (TEMOVATE) 0.05 % Apply topically 2 (two) times daily.    [name] dextromethorphan (DELSYM) 30 MG/5ML liquid Take by mouth as needed for cough.    . Dextromethorphan-guaiFENesin (ROBITUSSIN DM PO) Take by mouth. Taking cough gel as needed    . gabapentin (NEURONTIN) 100 MG capsule Take 1 capsule (100 mg total) by mouth 2 (two) times daily. 60 capsule 5  . levocetirizine (XYZAL) 5 MG tablet Take 1 tablet (5 mg total) by mouth every evening. 90 tablet 1  . levothyroxine (SYNTHROID) 125 MCG tablet Take 1 tablet (125 mcg total) by mouth daily before breakfast. 90 tablet 1  . Magnesium 250 MG TABS Take 250 mg by mouth at bedtime.     . meclizine (ANTIVERT) 25 MG tablet Take 25 mg by mouth 2 (two) times daily as needed for dizziness.    . methocarbamol (ROBAXIN) 500 MG tablet Take 500 mg by mouth every 6 (six) hours as needed.    . metoprolol succinate (TOPROL-XL) 25 MG 24  hr tablet Take 0.5 tablets (12.5 mg total) by mouth daily. 45 tablet 1  . montelukast (SINGULAIR) 10 MG tablet TAKE 1 TABLET BY MOUTH AT  BEDTIME 90 tablet 1  . Multiple Vitamins-Minerals (MULTI FOR HIM 50+) TABS Take 1 tablet by mouth daily.    . pantoprazole (PROTONIX) 40 MG tablet TAKE 1 TABLET BY MOUTH  TWICE DAILY 180 tablet 1  . silodosin (RAPAFLO) 8 MG CAPS capsule Take 1 capsule (8 mg total) by mouth at bedtime. TAKE ONE CAPSULE BY MOUTH DAILY AT BEDTIME 90 capsule 1  .  torsemide (DEMADEX) 10 MG tablet Take 1 tablet (10 mg total) by mouth daily. (Patient not taking: Reported on 04/06/2021) 90 tablet 1   No current facility-administered medications on file prior to visit.    Allergies:  Allergies  Allergen Reactions  . Nsaids Other (See Comments)    Cannot take-per MD  . Other Other (See Comments)    All blood thinners-cannot take per MD  . Aspirin Other (See Comments)    nosebleeds  . Statins Other (See Comments)    Weakness, myalgias  . Sulfa Antibiotics   . Tizanidine Hcl Other (See Comments)    Patient stated that after taking this medication he experienced feelings of dizziness and disorientation.      Vital Signs:  BP 130/72   Pulse 68   Ht 5\' 10"  (1.778 m)   Wt 192 lb (87.1 kg)   SpO2 93%   BMI 27.55 kg/m   Neurological Exam: MENTAL STATUS including orientation to time, place, person, recent and remote memory, attention span and concentration, language, and fund of knowledge is normal.  Speech is not dysarthric.  CRANIAL NERVES:  Normal fundoscopic exam.  Pupils equal round and reactive to light.  Normal conjugate, extra-ocular eye movements in all directions of gaze.  Right ptosis (old).  Face is symmetric and muscles are intact. Palate elevates symmetrically.  Tongue is midline.  MOTOR:  Motor strength is 5/5 in all extremities.   No pronator drift.  There is mild spasticity in the RUE and RLE (0+)  MSRs:  Reflexes are 3+/4 throughout, brisker on the right side.  SENSORY:  Vibration is reduced at the ankles (75%), trace at the great toe, intact at the knees.    COORDINATION/GAIT:  Normal finger-to- nose-finger. Intact rapid alternating movements bilaterally.  Gait narrow based and stable.  Tandem and stressed gait intact.   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  Stable 4 mm LEFT-to-RIGHT midline shift.  CT head 06/24/2015:  Significant interval improvement when compared to the 05/13/2015 examination.  The ring-enhancing lesion which was centered in the left basal ganglia has decreased significantly in size. Currently, 2.3 x 0.9 cm region of relatively solid enhancement superior aspect of the left basal ganglia remains as versus prior 3.1 x 1.9 cm ring-enhancing lesion. Significant decrease in surrounding vasogenic edema and marked decrease mass effect upon the left lateral ventricle. What remains may represent residua of treated abscess. This will need to be followup until complete clearance. As this clears it would be helpful to exclude the possibility of underlying vascular abnormality in this patient with Osler-Weber-Rendu.  CT head 08/11/2015: Resolving abscess left external capsule. There is decreased edema and decreased enhancement in this area. No residual fluid collection. No  other areas of acute abnormality.  CT head wwo contrast 01/06/2016:  Hypoattenuation in the LEFT basal ganglia and regional white matter is improved from August but not yet normalized. No abnormal postcontrast enhancement to definitively suggest residual infection. Continued surveillance may be warranted, given the patient's residual RIGHT leg weakness and intermittent slurred speech.  NCS/EMG of the legs 09/16/2015: 1. The electrophysiologic findings are most consistent with a generalized sensorimotor polyneuropathy, axon loss in type, affecting the lower extremities. Overall, these findings are severe in degree electrically with respect to sensory responses. 2. 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  Labs 09/11/2015:  vitamin B12 874, folate 24.8, vitamin B1 36, copper 77, vitamin B6 73.8, myasthenia panel negative  CT head  01/21/2017:  No change from the prior CT. Negative for abscess or mass lesion. Chronic encephalomalacia left external capsule related to prior cerebral abscess.  IMPRESSION/PLAN: 1.  Gait unsteadiness, mild and chronic.  Stable.  - He walks unassisted and has problems only with turning.  Exam looks stable and he walks unassisted.    - Continue home exercises  2.  Speech changes, no dysarthria.  I cannot appreciate these changes  - Continue to monitor  3.  Cognitive changes with forgetfulness.  He remains highly independent with all IADLs and ADLs   - Neurocognitive testing deferred unless symptoms get worse  4.  Drug-induced neuropathy, stable and mild.   - He takes gabapentin 100mg  BID for chronic cough  5. 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    Return to clinic in 1 year    Thank you for allowing me to participate in patient's care.  If I can answer any additional questions, I would be pleased to do so.    Sincerely,    Emely Fahy K. Posey Pronto, DO

## 2021-04-10 NOTE — Telephone Encounter (Signed)
Called and spoke with pt and have scheduled him a new pt appt with Dr. Silas Flood 5/5. nothing further needed.

## 2021-04-17 NOTE — Progress Notes (Signed)
    Chronic Care Management Pharmacy Assistant   Name: Gregory [name]  MRN: [id number] DOB: Nov 12, 1941    Reason for Encounter:Chart Review   Medications: Outpatient Encounter Medications as of 03/31/2021  Medication Sig  . acetaminophen (TYLENOL) 325 MG tablet Take 650 mg by mouth every 6 (six) hours as needed for mild pain.  [name] amoxicillin (AMOXIL) 500 MG capsule SMARTSIG:4 Capsule(s) By Mouth PRN  . Cholecalciferol (VITAMIN D3) 2000 UNITS TABS Take 5,000 Units by mouth daily.   . clidinium-chlordiazePOXIDE (LIBRAX) 5-2.5 MG capsule Take 1 capsule by mouth 3 (three) times daily before meals.  . clobetasol ointment (TEMOVATE) 0.05 % Apply topically 2 (two) times daily.  [name] dextromethorphan (DELSYM) 30 MG/5ML liquid Take by mouth as needed for cough.  . Dextromethorphan-guaiFENesin (ROBITUSSIN DM PO) Take by mouth. Taking cough gel as needed  . gabapentin (NEURONTIN) 100 MG capsule Take 1 capsule (100 mg total) by mouth 2 (two) times daily.  [name] levocetirizine (XYZAL) 5 MG tablet Take 1 tablet (5 mg total) by mouth every evening.  [name] levothyroxine (SYNTHROID) 125 MCG tablet Take 1 tablet (125 mcg total) by mouth daily before breakfast.  . Magnesium 250 MG TABS Take 250 mg by mouth at bedtime.   . meclizine (ANTIVERT) 25 MG tablet Take 25 mg by mouth 2 (two) times daily as needed for dizziness.  . methocarbamol (ROBAXIN) 500 MG tablet Take 500 mg by mouth every 6 (six) hours as needed.  . metoprolol succinate (TOPROL-XL) 25 MG 24 hr tablet Take 0.5 tablets (12.5 mg total) by mouth daily.  . montelukast (SINGULAIR) 10 MG tablet TAKE 1 TABLET BY MOUTH AT  BEDTIME  . Multiple Vitamins-Minerals (MULTI FOR HIM 50+) TABS Take 1 tablet by mouth daily.  . pantoprazole (PROTONIX) 40 MG tablet TAKE 1 TABLET BY MOUTH  TWICE DAILY  . silodosin (RAPAFLO) 8 MG CAPS capsule Take 1 capsule (8 mg total) by mouth at bedtime. TAKE ONE CAPSULE BY MOUTH DAILY AT BEDTIME  . torsemide (DEMADEX) 10 MG tablet Take  1 tablet (10 mg total) by mouth daily. (Patient not taking: Reported on 04/06/2021)   No facility-administered encounter medications on file as of 03/31/2021.    Reviewed chart for medication changes and adherence.   No gaps in adherence identified. Patient has follow up scheduled with pharmacy team. No further action required.  [place] Pharmacist Assistant [phone]  Time spent:8

## 2021-04-23 ENCOUNTER — Other Ambulatory Visit: Payer: Self-pay

## 2021-04-23 ENCOUNTER — Ambulatory Visit (INDEPENDENT_AMBULATORY_CARE_PROVIDER_SITE_OTHER): Payer: Medicare Other | Admitting: Pulmonary Disease

## 2021-04-23 ENCOUNTER — Ambulatory Visit (INDEPENDENT_AMBULATORY_CARE_PROVIDER_SITE_OTHER): Payer: Medicare Other | Admitting: Internal Medicine

## 2021-04-23 ENCOUNTER — Encounter: Payer: Self-pay | Admitting: Pulmonary Disease

## 2021-04-23 ENCOUNTER — Encounter: Payer: Self-pay | Admitting: Internal Medicine

## 2021-04-23 VITALS — BP 128/60 | HR 62 | Ht 70.0 in | Wt 192.0 lb

## 2021-04-23 DIAGNOSIS — I483 Typical atrial flutter: Secondary | ICD-10-CM

## 2021-04-23 DIAGNOSIS — N138 Other obstructive and reflux uropathy: Secondary | ICD-10-CM

## 2021-04-23 DIAGNOSIS — R059 Cough, unspecified: Secondary | ICD-10-CM | POA: Diagnosis not present

## 2021-04-23 DIAGNOSIS — K219 Gastro-esophageal reflux disease without esophagitis: Secondary | ICD-10-CM

## 2021-04-23 DIAGNOSIS — N401 Enlarged prostate with lower urinary tract symptoms: Secondary | ICD-10-CM

## 2021-04-23 DIAGNOSIS — E039 Hypothyroidism, unspecified: Secondary | ICD-10-CM

## 2021-04-23 MED ORDER — PANTOPRAZOLE SODIUM 40 MG PO TBEC: 1.0000 | DELAYED_RELEASE_TABLET | Freq: Two times a day (BID) | ORAL | 1 refills | Status: DC

## 2021-04-23 MED ORDER — SILODOSIN 8 MG PO CAPS
8.0000 mg | ORAL_CAPSULE | Freq: Every day | ORAL | 1 refills | Status: DC
Start: 2021-04-23 — End: 2021-10-26

## 2021-04-23 MED ORDER — LEVOTHYROXINE SODIUM 125 MCG PO TABS: 125.0000 ug | ORAL_TABLET | Freq: Every day | ORAL | 1 refills | Status: DC

## 2021-04-23 MED ORDER — MOMETASONE FURO-FORMOTEROL FUM 200-5 MCG/ACT IN AERO: 2.0000 | INHALATION_SPRAY | Freq: Two times a day (BID) | RESPIRATORY_TRACT | 11 refills | Status: DC

## 2021-04-23 MED ORDER — METOPROLOL SUCCINATE ER 25 MG PO TB24: 12.5000 mg | ORAL_TABLET | Freq: Every day | ORAL | 1 refills | Status: DC

## 2021-04-23 NOTE — Progress Notes (Signed)
Subjective:  Patient ID: Gregory [name], male    DOB: 1941-08-15  Age: 80 y.o. MRN: [id number]  CC: Follow-up  This visit occurred during the SARS-CoV-2 public health emergency.  Safety protocols were in place, including screening questions prior to the visit, additional usage of staff PPE, and extensive cleaning of exam room while observing appropriate contact time as indicated for disinfecting solutions.    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  He will start don'ts of physical therapy.  He is not taking the loop diuretic because he has had no more episodes of lower extremity edema.  He continues to complain of cough and will be seeing a pulmonologist later today.  Outpatient Medications Prior to Visit  Medication Sig Dispense Refill  . acetaminophen (TYLENOL) 325 MG tablet Take 650 mg by mouth every 6 (six) hours as needed for mild pain.    [name] amoxicillin (AMOXIL) 500 MG capsule SMARTSIG:4 Capsule(s) By Mouth PRN 4 capsule 3  . Cholecalciferol (VITAMIN D3) 2000 UNITS TABS Take 5,000 Units by mouth daily.     . clidinium-chlordiazePOXIDE (LIBRAX) 5-2.5 MG capsule Take 1 capsule by mouth 3 (three) times daily before meals. 90 capsule 3  . clobetasol ointment (TEMOVATE) 0.05 % Apply topically 2 (two) times daily.    [name] dextromethorphan (DELSYM) 30 MG/5ML liquid Take by mouth as needed for cough.    . Dextromethorphan-guaiFENesin (ROBITUSSIN DM PO) Take by mouth. Taking cough gel as needed    . gabapentin (NEURONTIN) 100 MG capsule Take 1 capsule (100 mg total) by mouth 2 (two) times daily. 60 capsule 5  . levocetirizine (XYZAL) 5 MG tablet Take 1 tablet (5 mg total) by mouth every evening. 90 tablet 1  . Magnesium 250 MG TABS Take 250 mg by mouth at bedtime.     . meclizine (ANTIVERT) 25 MG tablet Take 25 mg by mouth 2 (two) times daily as needed for dizziness.    . methocarbamol  (ROBAXIN) 500 MG tablet Take 500 mg by mouth every 6 (six) hours as needed.    . montelukast (SINGULAIR) 10 MG tablet TAKE 1 TABLET BY MOUTH AT  BEDTIME 90 tablet 1  . Multiple Vitamins-Minerals (MULTI FOR HIM 50+) TABS Take 1 tablet by mouth daily.    [name] levothyroxine (SYNTHROID) 125 MCG tablet Take 1 tablet (125 mcg total) by mouth daily before breakfast. 90 tablet 1  . metoprolol succinate (TOPROL-XL) 25 MG 24 hr tablet Take 0.5 tablets (12.5 mg total) by mouth daily. 45 tablet 1  . pantoprazole (PROTONIX) 40 MG tablet TAKE 1 TABLET BY MOUTH  TWICE DAILY 180 tablet 1  . silodosin (RAPAFLO) 8 MG CAPS capsule Take 1 capsule (8 mg total) by mouth at bedtime. TAKE ONE CAPSULE BY MOUTH DAILY AT BEDTIME 90 capsule 1  . torsemide (DEMADEX) 10 MG tablet Take 1 tablet (10 mg total) by mouth daily. 90 tablet 1   No facility-administered medications prior to visit.    ROS Review of Systems  Constitutional: Negative.   HENT: Negative for nosebleeds.   Eyes: Negative.   Respiratory: Positive for cough. Negative for shortness of breath and wheezing.   Cardiovascular: Negative for chest pain, palpitations and leg swelling.  Gastrointestinal: Negative for abdominal pain, blood in stool and constipation.  Genitourinary: Negative.   Musculoskeletal: Negative.   Skin: Negative.   Neurological: Negative.  Negative for  dizziness and weakness.  Hematological: Negative for adenopathy. Does not bruise/bleed easily.  Psychiatric/Behavioral: Negative.     Objective:  BP 116/60 (BP Location: Left Arm, Patient Position: Sitting, Cuff Size: Large)   Pulse 63   Temp 97.8 F (36.6 C) (Oral)   Ht 5\' 10"  (1.778 m)   Wt 190 lb (86.2 kg)   SpO2 92%   BMI 27.26 kg/m   BP Readings from Last 3 Encounters:  04/23/21 116/60  04/06/21 130/72  03/19/21 112/68    Wt Readings from Last 3 Encounters:  04/23/21 190 lb (86.2 kg)  04/06/21 192 lb (87.1 kg)  03/19/21 191 lb (86.6 kg)    Physical Exam Vitals  reviewed.  HENT:     Nose: Nose normal.     Mouth/Throat:     Mouth: Mucous membranes are moist.  Eyes:     Conjunctiva/sclera: Conjunctivae normal.  Cardiovascular:     Rate and Rhythm: Normal rate and regular rhythm.     Heart sounds: No murmur heard.   Pulmonary:     Effort: Pulmonary effort is normal.     Breath sounds: No stridor. No wheezing, rhonchi or rales.  Abdominal:     General: Abdomen is flat. Bowel sounds are normal. There is no distension.     Palpations: Abdomen is soft. There is no hepatomegaly, splenomegaly or mass.  Musculoskeletal:        General: Normal range of motion.     Cervical back: Neck supple.     Right lower leg: No edema.     Left lower leg: No edema.  Lymphadenopathy:     Cervical: No cervical adenopathy.  Skin:    General: Skin is warm and dry.  Neurological:     General: No focal deficit present.     Mental Status: He is alert.     Lab Results  Component Value Date   WBC 6.6 03/19/2021   HGB 9.0 (L) 03/19/2021   HCT 28.0 (L) 03/19/2021   PLT 395.0 03/19/2021   GLUCOSE 99 03/19/2021   CHOL 140 10/23/2020   TRIG 62.0 10/23/2020   HDL 46.30 10/23/2020   LDLDIRECT 139.4 06/15/2007   LDLCALC 81 10/23/2020   ALT 12 04/24/2019   AST 15 04/24/2019   NA 133 (L) 03/19/2021   K 4.1 03/19/2021   CL 99 03/19/2021   CREATININE 1.03 03/19/2021   BUN 17 03/19/2021   CO2 28 03/19/2021   TSH 0.50 03/19/2021   PSA 1.77 04/19/2018   INR 1.16 05/13/2015   HGBA1C 6.3 10/23/2020    CT Chest W Contrast  Result Date: 12/07/2020 CLINICAL DATA:  Cough and worsening dyspnea for a few days. Abnormal chest radiograph with interstitial prominence. 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 EXAM: CT CHEST WITH CONTRAST TECHNIQUE: Multidetector CT imaging of the chest was performed during intravenous contrast administration. CONTRAST:  35mL OMNIPAQUE IOHEXOL 300 MG/ML  SOLN COMPARISON:  Chest radiograph from earlier today. 12/15/2018  chest CT angiogram. FINDINGS: Cardiovascular: Normal heart size. No significant pericardial effusion/thickening. Atherosclerotic nonaneurysmal thoracic aorta. Normal caliber pulmonary arteries. No central pulmonary emboli. Mediastinum/Nodes: Thyroid is either atrophic or surgically absent. Unremarkable esophagus. No pathologically enlarged axillary, mediastinal or hilar lymph nodes. Lungs/Pleura: No pneumothorax. No pleural effusion. No acute consolidative airspace disease. 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 Tiny 0.5 cm pulmonary AVM in the posterior left lower  lobe (series 3/image 117), previously 0.5 cm, stable. No new significant pulmonary nodules or pulmonary AVMs. Mild patchy subpleural reticulation throughout both lungs without significant regions of traction bronchiectasis, architectural distortion or frank honeycombing, not substantially changed. Upper abdomen: No acute abnormality. Subcentimeter hypodense inferior left liver lesion is too small to characterize and unchanged, considered benign. Musculoskeletal: No aggressive appearing focal osseous lesions. Mild thoracic spondylosis. IMPRESSION: 1. Stable chest CT since 12/15/2018.  No acute pulmonary disease. 2. 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 3. Nonspecific stable mild patchy subpleural reticulation throughout both lungs, with the differential including hypoventilatory change, mild nonspecific postinfectious/postinflammatory scarring or indolent interstitial lung disease including early UIP (less favored). High-resolution chest CT follow-up could be obtained in 12 months to assess temporal pattern stability, as clinically warranted. 4. Aortic Atherosclerosis (ICD10-I70.0). Electronically Signed   By: Ilona Sorrel M.D.   On:  12/07/2020 15:57   DG Chest Port 1 View  Result Date: 12/07/2020 CLINICAL DATA:  Cough. EXAM: PORTABLE CHEST 1 VIEW COMPARISON:  October 23, 2020. FINDINGS: The heart size and mediastinal contours are within normal limits. There is new diffuse interstitial prominence. No focal consolidation. No visible pleural effusions or pneumothorax. Biapical pleuroparenchymal scarring. Redemonstrated nodular opacity in the right lower lung, compatible with an AVM that was better characterized on prior CTA chest from May 14, 2015. IMPRESSION: 1. New diffuse interstitial prominence, which could represent interstitial edema or atypical infection. 2. Redemonstrated nodular opacity in the right lower lung, compatible with an AVM that was better characterized on prior CTA chest from May 14, 2015. Electronically Signed   By: Margaretha Sheffield MD   On: 12/07/2020 13:57    Assessment & Plan:   Gregory [name] was seen today for follow-up.  Diagnoses and all orders for this visit:  Benign prostatic hyperplasia with urinary obstruction -     silodosin (RAPAFLO) 8 MG CAPS capsule; Take 1 capsule (8 mg total) by mouth at bedtime. TAKE ONE CAPSULE BY MOUTH DAILY AT BEDTIME  Typical atrial flutter (HCC) -     metoprolol succinate (TOPROL-XL) 25 MG 24 hr tablet; Take 0.5 tablets (12.5 mg total) by mouth daily.  Laryngopharyngeal reflux (LPR) -     pantoprazole (PROTONIX) 40 MG tablet; Take 1 tablet (40 mg total) by mouth 2 (two) times daily.  Acquired hypothyroidism- His recent TSH was in the normal range.  He will stay on the current dose of levothyroxine. -     levothyroxine (SYNTHROID) 125 MCG tablet; Take 1 tablet (125 mcg total) by mouth daily before breakfast.   I have discontinued Gregory Chambers "[name]"'s torsemide. I have also changed his pantoprazole. Additionally, I am having him maintain his Magnesium, Vitamin D3, levocetirizine, Multi For Him 50+, acetaminophen, meclizine, clobetasol ointment, methocarbamol,  amoxicillin, clidinium-chlordiazePOXIDE, Dextromethorphan-guaiFENesin (ROBITUSSIN DM PO), dextromethorphan, gabapentin, montelukast, silodosin, metoprolol succinate, and levothyroxine.  Meds ordered this encounter  Medications  . silodosin (RAPAFLO) 8 MG CAPS capsule    Sig: Take 1 capsule (8 mg total) by mouth at bedtime. TAKE ONE CAPSULE BY MOUTH DAILY AT BEDTIME    Dispense:  90 capsule    Refill:  1  . metoprolol succinate (TOPROL-XL) 25 MG 24 hr tablet    Sig: Take 0.5 tablets (12.5 mg total) by mouth daily.    Dispense:  45 tablet    Refill:  1  . pantoprazole (PROTONIX) 40 MG tablet  Sig: Take 1 tablet (40 mg total) by mouth 2 (two) times daily.    Dispense:  180 tablet    Refill:  1  . levothyroxine (SYNTHROID) 125 MCG tablet    Sig: Take 1 tablet (125 mcg total) by mouth daily before breakfast.    Dispense:  90 tablet    Refill:  1     Follow-up: Return in about 6 months (around 10/24/2021).  Scarlette Calico, MD

## 2021-04-23 NOTE — Patient Instructions (Signed)
Hypothyroidism  Hypothyroidism is when the thyroid gland does not make enough of certain hormones (it is underactive). The thyroid gland is a small gland located in the lower front part of the neck, just in front of the windpipe (trachea). This gland makes hormones that help control how the body uses food for energy (metabolism) as well as how the heart and brain function. These hormones also play a role in keeping your bones strong. When the thyroid is underactive, it produces too little of the hormones thyroxine (T4) and triiodothyronine (T3). What are the causes? This condition may be caused by:  Hashimoto's disease. This is a disease in which the body's disease-fighting system (immune system) attacks the thyroid gland. This is the most common cause.  Viral infections.  Pregnancy.  Certain medicines.  Birth defects.  Past radiation treatments to the head or neck for cancer.  Past treatment with radioactive iodine.  Past exposure to radiation in the environment.  Past surgical removal of part or all of the thyroid.  Problems with a gland in the center of the brain (pituitary gland).  Lack of enough iodine in the diet. What increases the risk? You are more likely to develop this condition if:  You are male.  You have a family history of thyroid conditions.  You use a medicine called lithium.  You take medicines that affect the immune system (immunosuppressants). What are the signs or symptoms? Symptoms of this condition include:  Feeling as though you have no energy (lethargy).  Not being able to tolerate cold.  Weight gain that is not explained by a change in diet or exercise habits.  Lack of appetite.  Dry skin.  Coarse hair.  Menstrual irregularity.  Slowing of thought processes.  Constipation.  Sadness or depression. How is this diagnosed? This condition may be diagnosed based on:  Your symptoms, your medical history, and a physical exam.  Blood  tests. You may also have imaging tests, such as an ultrasound or MRI. How is this treated? This condition is treated with medicine that replaces the thyroid hormones that your body does not make. After you begin treatment, it may take several weeks for symptoms to go away. Follow these instructions at home:  Take over-the-counter and prescription medicines only as told by your health care provider.  If you start taking any new medicines, tell your health care provider.  Keep all follow-up visits as told by your health care provider. This is important. ? As your condition improves, your dosage of thyroid hormone medicine may change. ? You will need to have blood tests regularly so that your health care provider can monitor your condition. Contact a health care provider if:  Your symptoms do not get better with treatment.  You are taking thyroid hormone replacement medicine and you: ? Sweat a lot. ? Have tremors. ? Feel anxious. ? Lose weight rapidly. ? Cannot tolerate heat. ? Have emotional swings. ? Have diarrhea. ? Feel weak. Get help right away if you have:  Chest pain.  An irregular heartbeat.  A rapid heartbeat.  Difficulty breathing. Summary  Hypothyroidism is when the thyroid gland does not make enough of certain hormones (it is underactive).  When the thyroid is underactive, it produces too little of the hormones thyroxine (T4) and triiodothyronine (T3).  The most common cause is Hashimoto's disease, a disease in which the body's disease-fighting system (immune system) attacks the thyroid gland. The condition can also be caused by viral infections, medicine, pregnancy, or   past radiation treatment to the head or neck.  Symptoms may include weight gain, dry skin, constipation, feeling as though you do not have energy, and not being able to tolerate cold.  This condition is treated with medicine to replace the thyroid hormones that your body does not make. This  information is not intended to replace advice given to you by your health care provider. Make sure you discuss any questions you have with your health care provider. Document Revised: 09/05/2020 Document Reviewed: 08/21/2020 Elsevier Patient Education  2021 Elsevier Inc.  

## 2021-04-23 NOTE — Patient Instructions (Signed)
Nice to meet you  Will work hard to improve his cough  Use Dulera 2 puffs twice a day--Dr. Jeannine Kitten note indicated this had been successful in improving the cough in the past.  We will consider carefully increasing the gabapentin in the next 6 weeks or so if the inhaler is not beneficial.  Return to clinic for follow up with Dr. Silas Flood in 3 months

## 2021-04-24 NOTE — Progress Notes (Signed)
@Patient  ID: Gregory [name], male    DOB: [birth year], 80 y.o.   MRN: [id number]  Chief Complaint  Patient presents with  . Consult    Chronic cough x 2 years    Referring provider: Janith Lima, MD  HPI:   80 year old here to establish care for chronic cough.  Seen recently by Dr. Vaughan Browner, note reviewed.  Previously followed with Dr. Lenna Gilford  Multiple notes reviewed.  Most recent cardiology note reviewed.  Patient has chronic cough over the last 2 years.  When asked in more detail he admits cough is been present for well over a decade.  Unclear how much change there has been since last seen by pulmonary some years ago.  Does seem that the frequency has worsened over the last couple of years.  Worse at night but occurs throughout the day.  It is nonproductive.  Uses a variety of dextromethorphan containing products that do help, he rotates through these 2 or 3 products as they seem to wear off and affect.  He is currently on Breo does not seem to help.  Recently resumed gabapentin at low-dose, 100 mg twice daily with mild improvement.  Notes in the past he was on 200 mg twice daily of gabapentin with significant improvement however he had syncope in the setting of what sounds like polypharmacy.  Prior pulmonary notes indicate that he is cough improved on Dulera.  He identifies no other alleviating or exacerbating factors.  No environmental or seasonal factors that contribute to better or worse cough.  No position where things are better or worse.  He is on a PPI twice daily for a long history of GERD.  Does endorse occasional breakthrough symptoms of heartburn, reflux.  Reports endoscopy, manometry, pH probe in the past although these results are all available for review and he does not recall the results.  Reviewed serial CT scans dating back to 2016, with persistent right lower lobe large AVM and small left lower lobe AVM stable from scan 04/2015, 09/2018, 11/2018, 11/2020 with otherwise clear  lungs on my interpretation.  PMH: HHT with pulmonary AVMs, hypertension, GERD Surgical history: Brain surgery for brain abscess, Family history: Mother with diabetes, hypertension, father with kidney failure Social history: Lives in [place], retired, went to [employer], has 3 grandchildren  Licensed conveyancer / Pulmonary Flowsheets:   ACT:  No flowsheet data found.  MMRC: No flowsheet data found.  Epworth:  No flowsheet data found.  Tests:   FENO:  No results found for: NITRICOXIDE  PFT: No flowsheet data found.  WALK:  No flowsheet data found.  Imaging: Personally reviewed and as per EMR  Lab Results: Personally reviewed CBC    Component Value Date/Time   WBC 6.6 03/19/2021 0959   RBC 3.96 (L) 03/19/2021 0959   HGB 9.0 (L) 03/19/2021 0959   HCT 28.0 (L) 03/19/2021 0959   PLT 395.0 03/19/2021 0959   MCV 70.6 (L) 03/19/2021 0959   MCH 26.0 12/07/2020 1438   MCHC 32.2 03/19/2021 0959   RDW 16.3 (H) 03/19/2021 0959   LYMPHSABS 1.9 03/19/2021 0959   MONOABS 0.9 03/19/2021 0959   EOSABS 0.0 03/19/2021 0959   BASOSABS 0.0 03/19/2021 0959    BMET    Component Value Date/Time   NA 133 (L) 03/19/2021 0959   K 4.1 03/19/2021 0959   CL 99 03/19/2021 0959   CO2 28 03/19/2021 0959   GLUCOSE 99 03/19/2021 0959   BUN 17 03/19/2021 0959   CREATININE 1.03  03/19/2021 0959   CALCIUM 9.3 03/19/2021 0959   GFRNONAA >60 12/07/2020 1438   GFRAA >60 06/01/2020 1106    BNP    Component Value Date/Time   BNP 63.5 12/07/2020 1439    ProBNP    Component Value Date/Time   PROBNP 126.0 (H) 03/19/2021 0959    Specialty Problems      Pulmonary Problems   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 Nsaids Other (See Comments)    Cannot take-per MD  . Other Other (See Comments)    All blood thinners-cannot take per  MD  . Aspirin Other (See Comments)    nosebleeds  . Statins Other (See Comments)    Weakness, myalgias  . Sulfa Antibiotics   . Tizanidine Hcl Other (See Comments)    Patient stated that after taking this medication he experienced feelings of dizziness and disorientation.     Immunization History  Administered Date(s) Administered  . Fluad Quad(high Dose 65+) 08/24/2019, 10/02/2020  . Influenza Split 09/21/2011, 09/27/2012  . Influenza Whole 09/11/2008, 09/11/2009, 09/22/2010  . Influenza, High Dose Seasonal PF 09/09/2016, 09/16/2017, 10/03/2018  . Influenza,inj,Quad PF,6+ Mos 10/04/2013, 10/08/2014, 10/02/2015  . PFIZER(Purple Top)SARS-COV-2 Vaccination 01/03/2020, 01/23/2020, 10/18/2020  . Pneumococcal Conjugate-13 12/07/2016  . Pneumococcal Polysaccharide-23 03/12/2010, 06/27/2018  . Tdap 10/04/2013  . Varicella 12/20/2009    Past Medical History:  Diagnosis Date  . Anxiety state, unspecified   . Arthritis   . Benign neoplasm of colon   . GERD (gastroesophageal reflux disease)   . 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 History of nonmelanoma skin cancer EXCISION SQUAMOUS CELL FROM HAND  . Hypertrophy of prostate with urinary obstruction and other lower urinary tract symptoms (LUTS)   . Irritable bowel syndrome   . Lumbago   . Other diseases of nasal cavity and sinuses(478.19)   . Persistent disorder of initiating or maintaining sleep   . Plantar fascial fibromatosis   . Pure hypercholesterolemia   . 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 Unspecified hypothyroidism     Tobacco History: Social History   Tobacco Use  Smoking Status Never Smoker  Smokeless Tobacco Never Used   Counseling given: Not Answered   Continue to not smoke  Outpatient Encounter Medications as of 04/23/2021  Medication Sig  .  acetaminophen (TYLENOL) 325 MG tablet Take 650 mg by mouth every 6 (six) hours as needed for mild pain.  [name] amoxicillin (AMOXIL) 500 MG capsule SMARTSIG:4 Capsule(s) By Mouth PRN  . Cholecalciferol (VITAMIN D3) 2000 UNITS TABS Take 5,000 Units by mouth daily.   . clidinium-chlordiazePOXIDE (LIBRAX) 5-2.5 MG capsule Take 1 capsule by mouth 3 (three) times daily before meals.  . clobetasol ointment (TEMOVATE) 0.05 % Apply topically 2 (two) times daily.  [name] dextromethorphan (DELSYM) 30 MG/5ML liquid Take by mouth as needed for cough.  . Dextromethorphan-guaiFENesin (ROBITUSSIN DM PO) Take by mouth. Taking cough gel as needed  . gabapentin (NEURONTIN) 100 MG capsule Take 1 capsule (100 mg total) by mouth 2 (two) times daily.  [name] levocetirizine (XYZAL) 5 MG tablet Take 1 tablet (5 mg total) by mouth every evening.  [name] levothyroxine (SYNTHROID) 125 MCG tablet Take 1 tablet (125 mcg  total) by mouth daily before breakfast.  . Magnesium 250 MG TABS Take 250 mg by mouth at bedtime.   . meclizine (ANTIVERT) 25 MG tablet Take 25 mg by mouth 2 (two) times daily as needed for dizziness.  . methocarbamol (ROBAXIN) 500 MG tablet Take 500 mg by mouth every 6 (six) hours as needed.  . metoprolol succinate (TOPROL-XL) 25 MG 24 hr tablet Take 0.5 tablets (12.5 mg total) by mouth daily.  . mometasone-formoterol (DULERA) 200-5 MCG/ACT AERO Inhale 2 puffs into the lungs in the morning and at bedtime.  . montelukast (SINGULAIR) 10 MG tablet TAKE 1 TABLET BY MOUTH AT  BEDTIME  . Multiple Vitamins-Minerals (MULTI FOR HIM 50+) TABS Take 1 tablet by mouth daily.  . pantoprazole (PROTONIX) 40 MG tablet Take 1 tablet (40 mg total) by mouth 2 (two) times daily.  . silodosin (RAPAFLO) 8 MG CAPS capsule Take 1 capsule (8 mg total) by mouth at bedtime. TAKE ONE CAPSULE BY MOUTH DAILY AT BEDTIME  . [DISCONTINUED] levothyroxine (SYNTHROID) 125 MCG tablet Take 1 tablet (125 mcg total) by mouth daily before breakfast.  . [DISCONTINUED]  metoprolol succinate (TOPROL-XL) 25 MG 24 hr tablet Take 0.5 tablets (12.5 mg total) by mouth daily.  . [DISCONTINUED] pantoprazole (PROTONIX) 40 MG tablet TAKE 1 TABLET BY MOUTH  TWICE DAILY  . [DISCONTINUED] silodosin (RAPAFLO) 8 MG CAPS capsule Take 1 capsule (8 mg total) by mouth at bedtime. TAKE ONE CAPSULE BY MOUTH DAILY AT BEDTIME  . [DISCONTINUED] torsemide (DEMADEX) 10 MG tablet Take 1 tablet (10 mg total) by mouth daily.   No facility-administered encounter medications on file as of 04/23/2021.     Review of Systems  Review of Systems  No chest pain with exertion.  No orthopnea or PND.  No dysphagia, odynophagia.  Comprehensive review of systems otherwise negative. Physical Exam  BP 128/60   Pulse 62   Ht 5\' 10"  (1.778 m)   Wt 192 lb (87.1 kg)   SpO2 93%   BMI 27.55 kg/m   Wt Readings from Last 5 Encounters:  04/23/21 192 lb (87.1 kg)  04/23/21 190 lb (86.2 kg)  04/06/21 192 lb (87.1 kg)  03/19/21 191 lb (86.6 kg)  01/15/21 192 lb 3.2 oz (87.2 kg)    BMI Readings from Last 5 Encounters:  04/23/21 27.55 kg/m  04/23/21 27.26 kg/m  04/06/21 27.55 kg/m  03/19/21 27.41 kg/m  01/15/21 27.58 kg/m     Physical Exam General: Well-appearing, in no acute distress Eyes: EOMI, no icterus Neck: Supple no JVP Cardiovascular: Regular in rhythm, no murmurs Pulmonary: Clear to auscultation bilaterally, no wheezing Abdomen: Nondistended, bowel sounds present MSK: No synovitis, no joint effusion Neuro: Normal gait, no weakness Psych: Normal mood, full affect   Assessment & Plan:   Chronic Cough: Likely multifactorial. Improved with Dulera in past although more recently Breo did not help. Begs question of cough variant asthma. He has GERD on treatment but does endorse refractory symptoms time to time. Lastly, improved with gabapentin in past - likely component of neurogenic component, habit. Symptoms point to upper airway cough. Resume Dulera high-dose. Slow increase in  gabapentin in future - caution as had syncope on 200 mg BID although suspect was polypharmacy at time.   GERD: Ok but with breakthrough symptoms. Has had manometry, pH probe but 10+ years in past. Consider re-engagement with GI for repeat work up as suspect is contributor to cough.  Pulmonary AVMs: Small left lower lobe, large right lower lobe.  Reports he discussed embolization with interventional radiology in the past but declined.  Mild oxygen desaturation, 93% today.  Denies significant dyspnea.  Can reconsider if symptoms or hypoxemia worsens.   Return in about 3 months (around 07/24/2021).   Lanier Clam, MD 04/24/2021

## 2021-05-06 ENCOUNTER — Telehealth: Payer: Self-pay | Admitting: Cardiology

## 2021-05-06 NOTE — Progress Notes (Signed)
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  Admitted 5/16 with headache and right lower extremity weakness. CT of the head demonstrated a brain mass measuring 3 x 2 cm with midline shift and edema. This was a ring enhancing mass in the left basal ganglia.He underwent stereotactic biopsy by neurosurgery. He was diagnosed with a brain abscess. Notes from ID indicate his cultures grew out Peptostreptococcus sp. Patient was followed by infectious disease in the hospital and placed on vancomycin, Rocephin and Flagyl.  He was also placed on Keppra for seizure prophylaxis. 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   Following DC, patient had an episode of elevated heart rate. EMS was called and he was noted to be in atrial flutter. This converted to sinus rhythm spontaneously. Patient placed on toprol. Event monitor showed slow atrial flutter versus ectopic atrial tachycardia.  Echocardiogram June 2021 showed vigorous LV function, mild left ventricular hypertrophy, grade 1 diastolic dysfunction, mild mitral regurgitation. Monitor July 2021 showed sinus bradycardia, normal sinus rhythm and sinus tachycardia.  Patient did have symptoms of dizziness with monitor in place.  Recently felt dizzy outside while working and was added to my schedule today. Since he was last seenhe describes intermittent dizziness.  On his most recent episode he was working outside and came in.  He then developed dizziness that was not associated with dyspnea, nausea, chest pain or palpitations.  He checked his blood pressure and his systolic was less than 90.  Symptoms lasted approximately 15 minutes and resolved.  He did state he took meclizine.  He does note that he has had dizziness with standing at times.  He did not have frank syncope.  He otherwise  denies dyspnea on exertion, orthopnea, PND, exertional chest pain or syncope.  He recently had minimal pedal edema which has resolved.  Current Outpatient Medications  Medication Sig Dispense Refill  . acetaminophen (TYLENOL) 325 MG tablet Take 650 mg by mouth every 6 (six) hours as needed for mild pain.    [name] amoxicillin (AMOXIL) 500 MG capsule SMARTSIG:4 Capsule(s) By Mouth PRN 4 capsule 3  . Cholecalciferol (VITAMIN D3) 2000 UNITS TABS Take 5,000 Units by mouth daily.     . clidinium-chlordiazePOXIDE (LIBRAX) 5-2.5 MG capsule Take 1 capsule by mouth 3 (three) times daily before meals. (Patient taking differently: Take 1 capsule by mouth 3 (three) times daily before meals. AS NEEDED 3 TIMES DAILY) 90 capsule 3  . clobetasol ointment (TEMOVATE) 0.05 % Apply topically 2 (two) times daily.    [name] dextromethorphan (DELSYM) 30 MG/5ML liquid Take by mouth as needed for cough.    . Dextromethorphan-guaiFENesin (ROBITUSSIN DM PO) Take by mouth. Taking cough gel as needed    . gabapentin (NEURONTIN) 100 MG capsule Take 1 capsule (100 mg total) by mouth 2 (two) times daily. 60 capsule 5  . levocetirizine (XYZAL) 5 MG tablet Take 1 tablet (5 mg total) by mouth every evening. 90 tablet 1  . levothyroxine (SYNTHROID) 125 MCG tablet Take 1 tablet (125 mcg total) by mouth daily before breakfast. 90 tablet 1  . Magnesium 250 MG TABS Take 250 mg by mouth at bedtime.     . meclizine (ANTIVERT) 25 MG tablet Take 25 mg by mouth 2 (two) times daily as needed for dizziness.    . methocarbamol (ROBAXIN) 500 MG tablet Take 500 mg by mouth  every 6 (six) hours as needed.    . metoprolol succinate (TOPROL-XL) 25 MG 24 hr tablet Take 0.5 tablets (12.5 mg total) by mouth daily. 45 tablet 1  . mometasone-formoterol (DULERA) 200-5 MCG/ACT AERO Inhale 2 puffs into the lungs in the morning and at bedtime. 1 each 11  . montelukast (SINGULAIR) 10 MG tablet TAKE 1 TABLET BY MOUTH AT  BEDTIME 90 tablet 1  . Multiple Vitamins-Minerals  (MULTI FOR HIM 50+) TABS Take 1 tablet by mouth daily.    . pantoprazole (PROTONIX) 40 MG tablet Take 1 tablet (40 mg total) by mouth 2 (two) times daily. 180 tablet 1  . silodosin (RAPAFLO) 8 MG CAPS capsule Take 1 capsule (8 mg total) by mouth at bedtime. TAKE ONE CAPSULE BY MOUTH DAILY AT BEDTIME 90 capsule 1   No current facility-administered medications for this visit.     Past Medical History:  Diagnosis Date  . Anxiety state, unspecified   . Arthritis   . Benign neoplasm of colon   . GERD (gastroesophageal reflux disease)   . 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 History of nonmelanoma skin cancer EXCISION SQUAMOUS CELL FROM HAND  . Hypertrophy of prostate with urinary obstruction and other lower urinary tract symptoms (LUTS)   . Irritable bowel syndrome   . Lumbago   . Other diseases of nasal cavity and sinuses(478.19)   . Persistent disorder of initiating or maintaining sleep   . Plantar fascial fibromatosis   . Pure hypercholesterolemia   . 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 Unspecified hypothyroidism     Past Surgical History:  Procedure Laterality Date  . APPLICATION OF CRANIAL NAVIGATION N/A 05/16/2015   Procedure: APPLICATION OF CRANIAL NAVIGATION;  Surgeon: Consuella Lose, MD;  Location: [facility];  Service: Neurosurgery;  Laterality: N/A;  . BRAIN BIOPSY Left 05/16/2015   Procedure: Stereotactic Left Brain Biopsy with Brain Lab;  Surgeon: Consuella Lose, MD;  Location: [facility];  Service: Neurosurgery;  Laterality: Left;  Stereotactic Left Brain biopsy with brainlab  . BRAIN SURGERY    . CARDIOVASCULAR STRESS TEST  01-14-2003   NO ISCHEMIA / EF 61%/ NORMAL LE WALL MOTION  . EXCISION LEFT WRIST GANGLIAN/ MYXOID CYST  05-30-2009  . IR RADIOLOGIST EVAL & MGMT  02/01/2019  . 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 OTHER SURGICAL HISTORY     pulse laser for facial telangiectasias inthe past  . removal of skin cancer from forehead  10/2012   Dr. Renda Rolls  . TRANSTHORACIC ECHOCARDIOGRAM  10-17-2008   DR Natacha Jepsen   NORMAL LVF/ EF 60%/  MILDLY DILATED RIGHT ATRIUM/ VENTRICULE  . VARICOCELECTOMY  1991   Dr. Tresa Endo    Social History   Socioeconomic History  . Marital status: Married    Spouse name: [name]  . Number of children: 2  . Years of education: Not on file  . Highest education level: Master's degree (e.g., MA, MS, MEng, MEd, MSW, MBA)  Occupational History    Comment: Consultant  Tobacco Use  . Smoking status: Never Smoker  . Smokeless tobacco: Never Used  Vaping Use  . Vaping Use: Never used  Substance and Sexual Activity  . Alcohol use: No    Alcohol/week: 0.0 standard drinks  .  Drug use: No  . Sexual activity: Not on file  Other Topics Concern  . Not on file  Social History Narrative   Lives with wife in a one story home.  Has 2 sons.  Retired Veterinary surgeon.  Education: Masters degree.      05/09/19- Pt states that his balance is worse since last visit. He has not fallen but has come close on a few occassions. The weakness on his right side is the same, he states.      Right Handed   Drinks caffeine    Social Determinants of Health   Financial Resource Strain: Low Risk   . Difficulty of Paying Living Expenses: Not hard at all  Food Insecurity: Not on file  Transportation Needs: Not on file  Physical Activity: Not on file  Stress: Not on file  Social Connections: Not on file  Intimate Partner Violence: Not on file    Family History  Problem Relation Age of Onset  . Diabetes Mother   . Hypertension Mother   . Pancreatic cancer Mother   . Stroke Mother   . Kidney failure Father   . Hypertension Sister   . Healthy Son     ROS: no fevers or chills, productive cough, hemoptysis,  dysphasia, odynophagia, melena, hematochezia, dysuria, hematuria, rash, seizure activity, orthopnea, PND, pedal edema, claudication. Remaining systems are negative.  Physical Exam: Well-developed well-nourished in no acute distress.  Skin is warm and dry.  HEENT is normal.  Neck is supple.  Chest is clear to auscultation with normal expansion.  Cardiovascular exam is regular rate and rhythm.  Abdominal exam nontender or distended. No masses palpated. Extremities show no edema. neuro grossly intact  ECG-normal sinus rhythm at a rate of 70, normal axis, no ST changes.  Personally reviewed  A/P  1 dizziness-recent episode sounds likely secondary to orthostasis.  We will repeat echocardiogram.  I have asked him to increase hydration and sodium intake.  2 history of atrial flutter versus ectopic atrial tachycardia-patient is in sinus rhythm on electrocardiogram today. He is not anticoagulated given history of Osler Weber Rendu with AV malformation/GI bleeding and prior brain abscess.  Given history of palpable orthostatic hypotension I will discontinue Toprol to allow blood pressure to run higher.  3 history of orthostatic hypotension-as outlined above we will continue to stress hydration and sodium intake.  4 hyperlipidemia-managed by primary care.  Kirk Ruths, MD

## 2021-05-06 NOTE — Telephone Encounter (Signed)
Agree with hydration and fu as scheduled Kirk Ruths

## 2021-05-06 NOTE — Telephone Encounter (Signed)
STAT if patient feels like he/she is going to faint   1) Are you dizzy now? No  2) Do you feel faint or have you passed out? No, felt like he was going to faint   3) Do you have any other symptoms? Sweating   4) Have you checked your HR and BP (record if available)? 5/39   Pt c/o BP issue: STAT if pt c/o blurred vision, one-sided weakness or slurred speech  1. What are your last 5 BP readings? 93/39  2. Are you having any other symptoms (ex. Dizziness, headache, blurred vision, passed out)? Dizziness   3. What is your BP issue? Hypotension when dizzy spell occurred while weed eating outside today    Pt is scheduled to see Dr. Stanford Breed on Friday in regards to this.

## 2021-05-06 NOTE — Telephone Encounter (Signed)
Returned call to patient and wife who state that patient was weed-eating outside a few hours ago and had sudden severe dizziness with profuse sweating. She states pt has a history of dizziness but she was concerned about the sweating in addition to dizziness. He did not pass out but felt like he would if he didn't sit down. BP was low following this event with most recent reading @ 96/46 mmHg, with HR 61. Patient is currently inside resting and is feeling better. He took meclizine x 1 and is drinking water. He denies chest discomfort at present and denies any chest discomfort at the time of the dizziness. He has an appointment on 5/20 with Dr. Stanford Breed and is wondering if there is anything available sooner. Patient recently started silodosin at bedtime and I advised that this may be contributing to hypotension. Advised pt to be more liberal with salt at lunch time today and to continue good hydration. Advised them that I will send message to Dr. Stanford Breed and Hilda Blades, RN for their awareness and if there are any changes, someone from our office will call back. Patient's wife verbalized understanding and agreement with plan and thanked me for the call.

## 2021-05-08 ENCOUNTER — Other Ambulatory Visit: Payer: Self-pay

## 2021-05-08 ENCOUNTER — Encounter: Payer: Self-pay | Admitting: Cardiology

## 2021-05-08 ENCOUNTER — Ambulatory Visit (INDEPENDENT_AMBULATORY_CARE_PROVIDER_SITE_OTHER): Payer: Medicare Other | Admitting: Cardiology

## 2021-05-08 VITALS — BP 114/52 | HR 70 | Ht 70.0 in | Wt 190.0 lb

## 2021-05-08 DIAGNOSIS — I483 Typical atrial flutter: Secondary | ICD-10-CM

## 2021-05-08 DIAGNOSIS — E7800 Pure hypercholesterolemia, unspecified: Secondary | ICD-10-CM | POA: Diagnosis not present

## 2021-05-08 DIAGNOSIS — R55 Syncope and collapse: Secondary | ICD-10-CM | POA: Diagnosis not present

## 2021-05-08 DIAGNOSIS — I951 Orthostatic hypotension: Secondary | ICD-10-CM

## 2021-05-08 NOTE — Patient Instructions (Signed)
Medication Instructions:   STOP METOPROLOL  *If you need a refill on your cardiac medications before your next appointment, please call your pharmacy*  Testing/Procedures:  Your physician has requested that you have an echocardiogram. Echocardiography is a painless test that uses sound waves to create images of your heart. It provides your doctor with information about the size and shape of your heart and how well your heart's chambers and valves are working. This procedure takes approximately one hour. There are no restrictions for this procedure.[place]     Follow-Up: At [facility], you and your health needs are our priority.  As part of our continuing mission to provide you with exceptional heart care, we have created designated Provider Care Teams.  These Care Teams include your primary Cardiologist (physician) and Advanced Practice Providers (APPs -  Physician Assistants and Nurse Practitioners) who all work together to provide you with the care you need, when you need it.  We recommend signing up for the patient portal called "MyChart".  Sign up information is provided on this After Visit Summary.  MyChart is used to connect with patients for Virtual Visits (Telemedicine).  Patients are able to view lab/test results, encounter notes, upcoming appointments, etc.  Non-urgent messages can be sent to your provider as well.   To learn more about what you can do with MyChart, go to [email or web address].    Your next appointment:   6 month(s)  The format for your next appointment:   In Person  Provider:   Kirk Ruths, MD

## 2021-05-21 ENCOUNTER — Ambulatory Visit (INDEPENDENT_AMBULATORY_CARE_PROVIDER_SITE_OTHER): Payer: Medicare Other | Admitting: Pharmacist

## 2021-05-21 ENCOUNTER — Other Ambulatory Visit: Payer: Self-pay

## 2021-05-21 DIAGNOSIS — I483 Typical atrial flutter: Secondary | ICD-10-CM

## 2021-05-21 DIAGNOSIS — J4530 Mild persistent asthma, uncomplicated: Secondary | ICD-10-CM

## 2021-05-21 DIAGNOSIS — E7800 Pure hypercholesterolemia, unspecified: Secondary | ICD-10-CM | POA: Diagnosis not present

## 2021-05-21 DIAGNOSIS — E039 Hypothyroidism, unspecified: Secondary | ICD-10-CM | POA: Diagnosis not present

## 2021-05-21 NOTE — Progress Notes (Signed)
Chronic Care Management Pharmacy Note  05/27/2021 Name:  Gregory [name] MRN:  [id number] DOB:  1941-03-24  Summary: -Pt reports cough has improved with Ruthe Mannan -Pt reports orthostasis has improved since stopping metoprolol (per cardiology). He has ECHO scheduled.  Recommendations/Changes made from today's visit: -Recommend to continue current medication -Advised againist using Delsym and Robitussin together    Subjective: Gregory [name] is an 80 y.o. year old male who is a primary patient of Janith Lima, MD.  The CCM team was consulted for assistance with disease management and care coordination needs.    Engaged with patient by telephone for follow up visit in response to provider referral for pharmacy case management and/or care coordination services.   Consent to Services:  The patient was given information about Chronic Care Management services, agreed to services, and gave verbal consent prior to initiation of services.  Please see initial visit note for detailed documentation.   Patient Care Team: Janith Lima, MD as PCP - General (Internal Medicine) Stanford Breed Denice Bors, MD as PCP - Cardiology (Cardiology) Alda Berthold, DO as Consulting Physician (Neurology) Charlton Haws, [facility] as Pharmacist (Pharmacist)  Recent office visits: 04/23/21 Dr Ronnald Ramp OV: chronic f/u; d/c torsemide due to no longer needed.  03/19/21 Dr Ronnald Ramp OV: c/o LE swelling. Started loop diuretic torsemide 10 mg.  Recent consult visits: 05/08/21 Dr Stanford Breed (cardiology): f/u syncope, atrial flutter. Likely orthostasis. Advised to increase hydration and sodium intake. D/C metoprolol.  04/23/21 Dr Silas Flood (pulmonary): c/o chronic cough. Resume Dulera BID. Slowly increase gabapentin in future. GERD may be contributing, advised f/u with GI.  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 No med changes.  Hospital visits: None in previous 6 months     Objective:  Lab Results  Component Value Date   CREATININE 1.03 03/19/2021   BUN 17 03/19/2021   GFR 69.62 03/19/2021   GFRNONAA >60 12/07/2020   GFRAA >60 06/01/2020   NA 133 (L) 03/19/2021   K 4.1 03/19/2021   CALCIUM 9.3 03/19/2021   CO2 28 03/19/2021   GLUCOSE 99 03/19/2021    Lab Results  Component Value Date/Time   HGBA1C 6.3 10/23/2020 10:50 AM   HGBA1C 5.7 07/31/2015 11:54 AM   GFR 69.62 03/19/2021 09:59 AM   GFR 73.22 10/23/2020 10:50 AM    Last diabetic Eye exam: No results found for: HMDIABEYEEXA  Last diabetic Foot exam: No results found for: HMDIABFOOTEX   Lab Results  Component Value Date   CHOL 140 10/23/2020   HDL 46.30 10/23/2020   LDLCALC 81 10/23/2020   LDLDIRECT 139.4 06/15/2007   TRIG 62.0 10/23/2020   CHOLHDL 3 10/23/2020    Hepatic Function Latest Ref Rng & Units 04/24/2019 11/14/2018 04/19/2018  Total Protein 6.0 - 8.3 g/dL 6.7 7.1 6.7  Albumin 3.5 - 5.2 g/dL 4.3 4.2 4.1  AST 0 - 37 U/L _0 ALT 0 - 53 U/L _1 Alk Phosphatase 39 - 117 U/L 55 64 55  Total Bilirubin 0.2 - 1.2 mg/dL 0.6 0.4 0.5  Bilirubin, Direct 0.0 - 0.3 mg/dL - - -    Lab Results  Component Value Date/Time   TSH 0.50 03/19/2021 09:59 AM   TSH 1.41 10/23/2020 10:50 AM    CBC Latest Ref Rng & Units 03/19/2021 12/07/2020 10/23/2020  WBC 4.0 - 10.5 K/uL 6.6 9.8 5.7  Hemoglobin 13.0 - 17.0 g/dL 9.0(L) 11.8(L) 13.8  Hematocrit 39.0 - 52.0 %  28.0(L) 36.4(L) 41.5  Platelets 150.0 - 400.0 K/uL 395.0 322 300.0    Lab Results  Component Value Date/Time   VD25OH 50 03/28/2013 11:53 AM    Clinical ASCVD: Yes  - aortic atherosclerosis  The 10-year ASCVD risk score Mikey Bussing DC Jr., et al., 2013) is: 23.3%   Values used to calculate the score:     Age: 37 years     Sex: Male     Is Non-Hispanic African American: No     Diabetic: No     Tobacco smoker: No     Systolic Blood Pressure: 165 mmHg     Is BP treated: No     HDL Cholesterol: 46.3 mg/dL     Total  Cholesterol: 140 mg/dL    Depression screen [facility] 2/9 06/09/2020 06/12/2019 06/27/2018  Decreased Interest 0 0 0  Down, Depressed, Hopeless 0 0 0  PHQ - 2 Score 0 0 0  Some recent data might be hidden    Social History   Tobacco Use  Smoking Status Never Smoker  Smokeless Tobacco Never Used   BP Readings from Last 3 Encounters:  05/08/21 (!) 114/52  04/23/21 128/60  04/23/21 116/60   Pulse Readings from Last 3 Encounters:  05/08/21 70  04/23/21 62  04/23/21 63   Wt Readings from Last 3 Encounters:  05/08/21 190 lb (86.2 kg)  04/23/21 192 lb (87.1 kg)  04/23/21 190 lb (86.2 kg)   BMI Readings from Last 3 Encounters:  05/08/21 27.26 kg/m  04/23/21 27.55 kg/m  04/23/21 27.26 kg/m    Assessment/Interventions: Review of patient past medical history, allergies, medications, health status, including review of consultants reports, laboratory and other test data, was performed as part of comprehensive evaluation and provision of chronic care management services.   SDOH:  (Social Determinants of Health) assessments and interventions performed: Yes  SDOH Screenings   Alcohol Screen: Not on file  Depression (PHQ2-9): Low Risk   . PHQ-2 Score: 0  Financial Resource Strain: Low Risk   . Difficulty of Paying Living Expenses: Not hard at all  Food Insecurity: Not on file  Housing: Not on file  Physical Activity: Not on file  Social Connections: Not on file  Stress: Not on file  Tobacco Use: Low Risk   . Smoking Tobacco Use: Never Smoker  . Smokeless Tobacco Use: Never Used  Transportation Needs: Not on file    CCM Care Plan  Allergies  Allergen Reactions  . Nsaids Other (See Comments)    Cannot take-per MD  . Other Other (See Comments)    All blood thinners-cannot take per MD  . Aspirin Other (See Comments)    nosebleeds  . Statins Other (See Comments)    Weakness, myalgias  . Sulfa Antibiotics   . Tizanidine Hcl Other (See Comments)    Patient stated that after  taking this medication he experienced feelings of dizziness and disorientation.     Medications Reviewed Today    Reviewed by Charlton Haws, [facility] (Pharmacist) on 05/27/21 at [place] List Status: <None>  Medication Order Taking? Sig Documenting Provider Last Dose Status Informant  acetaminophen (TYLENOL) 325 MG tablet [id number] Yes Take 650 mg by mouth every 6 (six) hours as needed for mild pain. [provider] Taking Active Multiple Informants  amoxicillin (AMOXIL) 500 MG capsule [id number] Yes SMARTSIG:4 Capsule(s) By Mouth PRN Janith Lima, MD Taking Active   Cholecalciferol (VITAMIN D3) 2000 UNITS TABS [id number] Yes Take 5,000 Units by mouth  daily.  [provider] Taking Active Multiple Informants  clidinium-chlordiazePOXIDE (LIBRAX) 5-2.5 MG capsule [id number] Yes Take 1 capsule by mouth 3 (three) times daily before meals.  Patient taking differently: Take 1 capsule by mouth 3 (three) times daily before meals. AS NEEDED 3 TIMES DAILY   Janith Lima, MD Taking Active   clobetasol ointment (TEMOVATE) 0.05 % [id number] Yes Apply topically 2 (two) times daily. [provider] Taking Active   dextromethorphan (DELSYM) 30 MG/5ML liquid [id number] Yes Take by mouth as needed for cough. [provider] Taking Active   Dextromethorphan-guaiFENesin [facility] DM PO) [id number] Yes Take by mouth. Taking cough gel as needed [provider] Taking Active   gabapentin (NEURONTIN) 100 MG capsule [id number] Yes Take 1 capsule (100 mg total) by mouth 2 (two) times daily. Marshell Garfinkel, MD Taking Active   levocetirizine (XYZAL) 5 MG tablet [id number] Yes Take 1 tablet (5 mg total) by mouth every evening. Janith Lima, MD Taking Active Multiple Informants  levothyroxine (SYNTHROID) 125 MCG tablet [id number] Yes Take 1 tablet (125 mcg total) by mouth daily before breakfast. Janith Lima, MD Taking Active   Magnesium 250 MG TABS [id number] Yes Take 250 mg  by mouth at bedtime.  [provider] Taking Active Multiple Informants  meclizine (ANTIVERT) 25 MG tablet [id number] Yes Take 25 mg by mouth 2 (two) times daily as needed for dizziness. [provider] Taking Active Multiple Informants  methocarbamol (ROBAXIN) 500 MG tablet [id number] Yes Take 500 mg by mouth every 6 (six) hours as needed. [provider] Taking Active   mometasone-formoterol [facility]) 200-5 MCG/ACT AERO [id number] Yes Inhale 2 puffs into the lungs in the morning and at bedtime. Hunsucker, Bonna Gains, MD Taking Active   montelukast (SINGULAIR) 10 MG tablet [id number] Yes TAKE 1 TABLET BY MOUTH AT  BEDTIME Janith Lima, MD Taking Active   Multiple Vitamins-Minerals [facility] FOR HIM 50+) TABS [id number] Yes Take 1 tablet by mouth daily. [provider] Taking Active Multiple Informants  pantoprazole (PROTONIX) 40 MG tablet [id number] Yes Take 1 tablet (40 mg total) by mouth 2 (two) times daily. Janith Lima, MD Taking Active   silodosin (RAPAFLO) 8 MG CAPS capsule [id number] Yes Take 1 capsule (8 mg total) by mouth at bedtime. TAKE ONE CAPSULE BY MOUTH DAILY AT BEDTIME Janith Lima, MD Taking Active           Patient Active Problem List   Diagnosis Date Noted  . Nonrheumatic mitral valve regurgitation 03/19/2021  . Bilateral leg edema 03/19/2021  . SBE (subacute bacterial endocarditis) prophylaxis candidate 10/23/2020  . Atherosclerosis of aorta ([place]) 10/23/2020  . Hyperglycemia 10/23/2020  . SIADH (syndrome of inappropriate ADH production) ([place]) 04/23/2020  . Chronic hyponatremia 10/24/2019  . Cough, persistent 10/24/2019  . Laryngopharyngeal reflux (LPR) 01/29/2019  . 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 Seasonal allergic rhinitis due to pollen 06/27/2018  . Peripheral neuropathy 01/01/2016  . Atrial flutter ([place]) 08/07/2015  . Encounter for therapeutic drug  monitoring   . Osteoarthritis 04/01/2015  . Iron deficiency anemia due to chronic blood loss 10/08/2014  . 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 HYPERCHOLESTEROLEMIA, MILD 07/25/2008  . CARCINOMA, SKIN, SQUAMOUS CELL 02/10/2008  . INSOMNIA, CHRONIC 02/10/2008  . GERD 02/10/2008  . Benign prostatic hyperplasia with urinary obstruction 02/10/2008  . 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 Hypothyroidism 02/08/2008  . Irritable bowel syndrome 02/08/2008  . BACK PAIN,  LUMBAR 02/08/2008    Immunization History  Administered Date(s) Administered  . Fluad Quad(high Dose 65+) 08/24/2019, 10/02/2020  . Influenza Split 09/21/2011, 09/27/2012  . Influenza Whole 09/11/2008, 09/11/2009, 09/22/2010  . Influenza, High Dose Seasonal PF 09/09/2016, 09/16/2017, 10/03/2018  . Influenza,inj,Quad PF,6+ Mos 10/04/2013, 10/08/2014, 10/02/2015  . PFIZER(Purple Top)SARS-COV-2 Vaccination 01/03/2020, 01/23/2020, 10/18/2020  . Pneumococcal Conjugate-13 12/07/2016  . Pneumococcal Polysaccharide-23 03/12/2010, 06/27/2018  . Tdap 10/04/2013  . Varicella 12/20/2009    Conditions to be addressed/monitored:  Hyperlipidemia, Asthma, Hypothyroidism and Allergic Rhinitis, chronic cough  Care Plan : [place]  Updates made by Charlton Haws, [place] since 05/27/2021 12:00 AM    Problem: Hypertension, Hyperlipidemia, Asthma, Hypothyroidism and Allergic Rhinitis, chronic cough   Priority: High    Long-Range Goal: Disease management   Start Date: 05/27/2021  Expected End Date: 05/27/2022  This Visit's Progress: On track  Priority: High  Note:   Current Barriers:  . Unable to independently monitor therapeutic efficacy  Pharmacist Clinical Goal(s):  [name] Patient will achieve adherence to monitoring guidelines and medication adherence to achieve therapeutic efficacy through collaboration with PharmD and provider.   Interventions: . 1:1 collaboration with Janith Lima, MD regarding development and update of comprehensive plan of care as evidenced by provider attestation and co-signature . Inter-disciplinary care team collaboration (see longitudinal plan of care) . Comprehensive medication review performed; medication list updated in electronic medical record   Atrial Flutter / Hypertension    BP goal is:  <140/90 Patient checks BP at home infrequently Patient home BP readings are ranging: 129/62, 78; 128/60, 132/62, 118/58 since stopping metoprolol    Patient has failed these meds in the past: metoprolol (orthostasis) Patient is currently controlled on the following medications:   No medications   We discussed: metoprolol was stopped 05/08/21 by cardiologist due to orthostasis; pt reports BP has been well controlled without medications; pt reports occasional "dizzy spells" related to activity/changing position   Plan: Continue to monitor BP at home Maintain adequate hydration   Hyperlipidemia    LDL goal < 100   Patient has failed these meds in past: simvastatin, rosuvastatin Patient is currently controlled on the following medications:   No medications   We discussed:  LDL is at goal without medication   Plan: Continue control with diet and exercise   Hypothyroidism    Patient has failed these meds in past: n/a Patient is currently uncontrolled on the following medications:   Levothyroxine 125 mcg daily   We discussed:  TSH is at goal; pt is taking prescribed dose   Plan: Recommend to continue current medication   Allerigic rhinitis / Asthma / cough    Patient has failed these meds in past: gabapentin Patient is currently uncontrolled on the following medications:   Levocetrizine 5 mg HS  Montelukast 10 mg HS  Dulera 200-5 mcg 2 puffs BID  Gabapentin 100 mg BID  Delsym cough syrup (HS)  Robitussin cough gels (AM)   We discussed:  Pt reports cough is improved since starting Dulera; he does still require cough  suppressants- he alternates Delsym and Robitussin, discussed these are essentially the same thing, advised to avoid coadministration   Plan: Continue current medications   Patient Goals/Self-Care Activities . Patient will:  - take medications as prescribed focus on medication adherence by pill box check blood pressure daily, document, and provide at future appointments      Medication Assistance: None required.  Patient affirms current coverage meets needs.  Compliance/Adherence/Medication fill history: Care Gaps: Hepatitis C screening Shingrix Covid booster (dose #4) - due 01/18/21 Colonoscopy (due 03/16/21)  Star-Rating Drugs: None  Patient's preferred pharmacy is:  [id number] [place] [place] - [address]Phone: [phone]: [phone] [facility]  [address]Phone: [phone]: 8[place]# [address] [place]Phone: [phone]: [phone] Uses pill box? Yes Pt endorses 100% compliance  We discussed: Current pharmacy is preferred with insurance plan and patient is satisfied with pharmacy services Patient decided to: Continue current medication management strategy  Care Plan and Follow Up Patient Decision:  Patient agrees to Care Plan and Follow-up.  Plan: Telephone follow up appointment with care management team member scheduled for:  6 months  LCharlene Brooke PharmD, [place] CPP Clinical Pharmacist [facility] at [phone]

## 2021-05-27 ENCOUNTER — Other Ambulatory Visit: Payer: Self-pay

## 2021-05-27 NOTE — Patient Instructions (Signed)
Visit Information  Phone number for Pharmacist: [phone]  Goals Addressed            This Visit's Progress   . Manage My Medicine       Timeframe:  Long-Range Goal Priority:  Medium Start Date:    05/21/21                         Expected End Date:    05/21/22                   Follow Up Date Dec 2022   - call for medicine refill 2 or 3 days before it runs out - call if I am sick and can't take my medicine - keep a list of all the medicines I take; vitamins and herbals too - use a pillbox to sort medicine -Do not use Delsym and Robitussin together - they are the same thing    Why is this important?   . These steps will help you keep on track with your medicines.   Notes:        Patient verbalizes understanding of instructions provided today and agrees to view in [place].  Telephone follow up appointment with pharmacy team member scheduled for: 6 months  Charlene Brooke, PharmD, [place], CPP Clinical Pharmacist [facility] at [facility] [phone]

## 2021-06-04 ENCOUNTER — Ambulatory Visit (HOSPITAL_COMMUNITY): Payer: Medicare Other | Attending: Cardiology

## 2021-06-04 ENCOUNTER — Other Ambulatory Visit: Payer: Self-pay

## 2021-06-04 DIAGNOSIS — R55 Syncope and collapse: Secondary | ICD-10-CM | POA: Diagnosis present

## 2021-06-04 LAB — ECHOCARDIOGRAM COMPLETE
Area-P 1/2: 3.77 cm2
S' Lateral: 3.2 cm

## 2021-06-08 ENCOUNTER — Other Ambulatory Visit: Payer: Self-pay

## 2021-06-08 ENCOUNTER — Ambulatory Visit: Payer: Medicare Other | Attending: Internal Medicine

## 2021-06-08 ENCOUNTER — Other Ambulatory Visit (HOSPITAL_BASED_OUTPATIENT_CLINIC_OR_DEPARTMENT_OTHER): Payer: Self-pay

## 2021-06-08 DIAGNOSIS — Z23 Encounter for immunization: Secondary | ICD-10-CM

## 2021-06-08 MED ORDER — PFIZER-BIONT COVID-19 VAC-TRIS 30 MCG/0.3ML IM SUSP
INTRAMUSCULAR | 0 refills | Status: DC
  Filled 2021-06-08: qty 0.3, 1d supply, fill #0

## 2021-06-08 NOTE — Progress Notes (Signed)
   Covid-19 Vaccination Clinic  Name:  ROMELL WOLDEN    MRN: [id number] DOB: 1941/04/01  06/08/2021  Mr. Bisono was observed post Covid-19 immunization for 15 minutes without incident. He was provided with Vaccine Information Sheet and instruction to access the V-Safe system.   Mr. Gallier was instructed to call 911 with any severe reactions post vaccine: Difficulty breathing  Swelling of face and throat  A fast heartbeat  A bad rash all over body  Dizziness and weakness   Immunizations Administered     Name Date Dose VIS Date Route   PFIZER Comrnaty(Gray TOP) Covid-19 Vaccine 06/08/2021 10:32 AM 0.3 mL 11/27/2020 Intramuscular   Manufacturer: [employer]   Lot: [id number]   [place]: [phone]

## 2021-06-20 ENCOUNTER — Other Ambulatory Visit: Payer: Self-pay | Admitting: Internal Medicine

## 2021-06-20 DIAGNOSIS — R059 Cough, unspecified: Secondary | ICD-10-CM

## 2021-06-22 ENCOUNTER — Other Ambulatory Visit: Payer: Self-pay | Admitting: Internal Medicine

## 2021-06-22 DIAGNOSIS — R059 Cough, unspecified: Secondary | ICD-10-CM

## 2021-06-22 MED ORDER — GABAPENTIN 100 MG PO CAPS: 100.0000 mg | ORAL_CAPSULE | Freq: Two times a day (BID) | ORAL | 1 refills | Status: DC

## 2021-06-29 LAB — HM COLONOSCOPY

## 2021-07-23 ENCOUNTER — Telehealth: Payer: Self-pay | Admitting: Cardiology

## 2021-07-23 NOTE — Telephone Encounter (Signed)
Patient sent message to scheduling stating,  "I have an appointment on November 17, but I am having increased problems with shortness of breath and dizziness. Can I get in sooner to see Dr. Stanford Breed? Thanks, Gregory [name] dob [birth year]"   Please advise.

## 2021-07-23 NOTE — Telephone Encounter (Signed)
Returned call to pt he states that he "goes outside to work" in the morning and while "working for 15 min" he gets SoB and Dizziness. He states that this happens daily. He is not experiencing this right now just when he "is working outside". Informed pt that our first available appointment is 09-23-21. Informed pt that with his sx he needs to go to the ER since we do not have a timely appointment. Pt laughed and hung up.

## 2021-07-24 ENCOUNTER — Telehealth: Payer: Self-pay | Admitting: Cardiology

## 2021-07-24 NOTE — Telephone Encounter (Signed)
Spoke with pt, he reports and increase in dizziness and SOB. He reports drinking plenty of fluids and reports it is not helping and now he is having SOB. Follow up scheduled

## 2021-07-24 NOTE — Progress Notes (Signed)
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    Admitted 5/16 with headache and right lower extremity weakness. CT of the head demonstrated a brain mass measuring 3 x 2 cm with midline shift and edema.  This was a ring enhancing mass in the left basal ganglia.  He underwent stereotactic biopsy by neurosurgery. He was diagnosed with a brain abscess.  Notes from ID indicate his cultures grew out Peptostreptococcus sp.  Patient was followed by infectious disease in the hospital and placed on vancomycin, Rocephin and Flagyl.  He was also placed on Keppra for seizure prophylaxis. 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    Following DC, patient had an episode of elevated heart rate. EMS was called and he was noted to be in atrial flutter. This converted to sinus rhythm spontaneously. Patient placed on toprol. Event monitor showed slow atrial flutter versus ectopic atrial tachycardia.    Monitor July 2021 showed sinus bradycardia, normal sinus rhythm and sinus tachycardia.  Patient did have symptoms of dizziness with monitor in place.  Chest CT December 2021 showed 5.1 cm pulmonary AVM, 0.5 cm pulmonary AVM in the posterior left lower lobe, nonspecific subpleural reticulation throughout both lungs.  Echocardiogram repeated June 2022 and showed normal LV function, normal diastolic function.  Patient contacted the office recently with dyspnea and dizziness and was added to my schedule today.  Since he was last seen patient complains of dizziness.  This occurs only with changing positions (lying to sitting/standing or bending and return to the standing position).  He has not had syncope.  His symptoms are worse outside.  He also notes increased dyspnea on exertion.  He denies orthopnea or PND but has mild ankle edema.  There is no chest  pain.  Current Outpatient Medications  Medication Sig Dispense Refill   acetaminophen (TYLENOL) 325 MG tablet Take 650 mg by mouth every 6 (six) hours as needed for mild pain.     amoxicillin (AMOXIL) 500 MG capsule SMARTSIG:4 Capsule(s) By Mouth PRN 4 capsule 3   Cholecalciferol (VITAMIN D3) 2000 UNITS TABS Take 5,000 Units by mouth daily.      clidinium-chlordiazePOXIDE (LIBRAX) 5-2.5 MG capsule Take 1 capsule by mouth 3 (three) times daily before meals. (Patient taking differently: Take 1 capsule by mouth 3 (three) times daily before meals. AS NEEDED 3 TIMES DAILY) 90 capsule 3   clobetasol ointment (TEMOVATE) 0.05 % Apply topically 2 (two) times daily.     COVID-19 mRNA Vac-TriS, Pfizer, (PFIZER-BIONT COVID-19 VAC-TRIS) SUSP injection Inject into the muscle. 0.3 mL 0   Dextromethorphan-guaiFENesin (ROBITUSSIN DM PO) Take by mouth. Taking cough gel as needed     gabapentin (NEURONTIN) 100 MG capsule Take 1 capsule (100 mg total) by mouth 2 (two) times daily. 180 capsule 1   levocetirizine (XYZAL) 5 MG tablet Take 1 tablet (5 mg total) by mouth every evening. 90 tablet 1   levothyroxine (SYNTHROID) 125 MCG tablet Take 1 tablet (125 mcg total) by mouth daily before breakfast. 90 tablet 1   Magnesium 250 MG TABS Take 250 mg by mouth at bedtime.      meclizine (ANTIVERT) 25 MG tablet Take 25 mg by mouth 2 (two) times daily as needed for dizziness.     methocarbamol (ROBAXIN) 500 MG tablet Take 500 mg by mouth every 6 (six) hours as needed.  mometasone-formoterol (DULERA) 200-5 MCG/ACT AERO Inhale 2 puffs into the lungs in the morning and at bedtime. 1 each 11   montelukast (SINGULAIR) 10 MG tablet TAKE 1 TABLET BY MOUTH AT  BEDTIME 90 tablet 1   Multiple Vitamins-Minerals (MULTI FOR HIM 50+) TABS Take 1 tablet by mouth daily.     pantoprazole (PROTONIX) 40 MG tablet Take 1 tablet (40 mg total) by mouth 2 (two) times daily. 180 tablet 1   silodosin (RAPAFLO) 8 MG CAPS capsule Take 1 capsule (8  mg total) by mouth at bedtime. TAKE ONE CAPSULE BY MOUTH DAILY AT BEDTIME 90 capsule 1   No current facility-administered medications for this visit.     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   History of nonmelanoma skin cancer EXCISION SQUAMOUS CELL FROM HAND   Hypertrophy of prostate with urinary obstruction and other lower urinary tract symptoms (LUTS)    Irritable bowel syndrome    Lumbago    Other diseases of nasal cavity and sinuses(478.19)    Persistent disorder of initiating or maintaining sleep    Plantar fascial fibromatosis    Pure hypercholesterolemia    Telangiectasia, hereditary hemorrhagic, of Rendu, Osler and Weber ([place]) OLSER'S DISEASE  (OWR)   SKIN, LIPS, NASAL W/ PREVIOUS NOSE BLEEDS  AMD GI TELANGIECTASIA   Unspecified hypothyroidism     Past Surgical History:  Procedure Laterality Date   APPLICATION OF CRANIAL NAVIGATION N/A 05/16/2015   Procedure: APPLICATION OF CRANIAL NAVIGATION;  Surgeon: Consuella Lose, MD;  Location: [facility];  Service: Neurosurgery;  Laterality: N/A;   BRAIN BIOPSY Left 05/16/2015   Procedure: Stereotactic Left Brain Biopsy with Brain Lab;  Surgeon: Consuella Lose, MD;  Location: [facility];  Service: Neurosurgery;  Laterality: Left;  Stereotactic Left Brain biopsy with brainlab   BRAIN SURGERY     CARDIOVASCULAR STRESS TEST  01-14-2003   NO ISCHEMIA / EF 61%/ NORMAL LE WALL MOTION   EXCISION LEFT WRIST GANGLIAN/ MYXOID CYST  05-30-2009   IR RADIOLOGIST EVAL & MGMT  02/01/2019   NASAL SINUS SURGERY     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 Renda Rolls   TRANSTHORACIC ECHOCARDIOGRAM   10-17-2008   DR Laird Runnion   NORMAL LVF/ EF 60%/  MILDLY DILATED RIGHT ATRIUM/ VENTRICULE   VARICOCELECTOMY  1991   Dr. Tresa Endo    Social History   Socioeconomic History   Marital status: Married    Spouse name: [name]   Number of children: 2   Years of education: Not on file   Highest education level: Master's degree (e.g., MA, MS, MEng, MEd, MSW, MBA)  Occupational History    Comment: Consultant  Tobacco Use   Smoking status: Never   Smokeless tobacco: Never  Vaping Use   Vaping Use: Never used  Substance and Sexual Activity   Alcohol use: No    Alcohol/week: 0.0 standard drinks   Drug use: No   Sexual activity: Not on file  Other Topics Concern   Not on file  Social History Narrative   Lives with wife in a one story home.  Has 2 sons.  Retired Veterinary surgeon.  Education: Masters degree.      05/09/19- Pt states that his balance is worse since last visit. He has not fallen but has come close on a few occassions. The weakness on his right side is the same, he states.      Right Handed   Drinks caffeine    Social Determinants of Health   Financial Resource Strain: Low Risk    Difficulty of Paying Living Expenses: Not hard at all  Food Insecurity: Not on file  Transportation Needs: Not on file  Physical Activity: Not on file  Stress: Not on file  Social Connections: Not on file  Intimate Partner Violence: Not on file    Family History  Problem Relation Age of Onset   Diabetes Mother    Hypertension Mother    Pancreatic cancer Mother    Stroke Mother    Kidney failure Father    Hypertension Sister    Healthy Son     ROS: no fevers or chills, productive cough, hemoptysis, dysphasia, odynophagia, melena, hematochezia, dysuria, hematuria, rash, seizure activity, orthopnea, PND, pedal edema, claudication. Remaining systems are negative.  Physical Exam: Well-developed well-nourished in no acute distress.  Skin is warm and dry.  HEENT is normal.  Neck is  supple.  Chest is clear to auscultation with normal expansion.  Cardiovascular exam is regular rate and rhythm.  Abdominal exam nontender or distended. No masses palpated. Extremities show no edema. neuro grossly intact  ECG-normal sinus rhythm at a rate of 81, no ST changes.  Personally reviewed  A/P  1 dyspnea-etiology unclear.  Most recent echocardiogram showed preserved LV function.  He does have a history of pulmonary AVMs and most recent CT with reticular pattern in the lungs.  He is scheduled to see pulmonary tomorrow.  I think he likely will need high-resolution CT scan.  If pulmonary evaluation unremarkable could consider stress nuclear study to screen for ischemia.  2 dizziness-orthostatic vitals checked in the office today.  In the lying position his blood pressure was 131/65 with a pulse of 77.  In the standing position he was 131/61 with a pulse of 76.  Symptoms most consistent with orthostasis though not orthostatic today.  He will continue hydration and increase sodium intake.  I have asked him to use compression hose.  If symptoms persist may need midodrine.  3 history of atrial flutter versus ectopic atrial tachycardia-patient remains in sinus rhythm.  As outlined in previous notes he is not anticoagulated given history of Osler-Weber-Rendu and AV malformation/GI bleeding with associated prior brain abscess.  4 orthostasis-plan as outlined under #2.  5 hyperlipidemia-Per primary care.  Kirk Ruths, MD

## 2021-07-24 NOTE — Telephone Encounter (Signed)
Patient would like to speak to [facility] regarding the last appointment he had with Dr. Stanford Breed

## 2021-07-27 ENCOUNTER — Other Ambulatory Visit: Payer: Self-pay

## 2021-07-27 ENCOUNTER — Ambulatory Visit (INDEPENDENT_AMBULATORY_CARE_PROVIDER_SITE_OTHER): Payer: Medicare Other | Admitting: Cardiology

## 2021-07-27 ENCOUNTER — Encounter: Payer: Self-pay | Admitting: Cardiology

## 2021-07-27 VITALS — BP 122/50 | HR 81 | Ht 70.0 in | Wt 192.6 lb

## 2021-07-27 DIAGNOSIS — I483 Typical atrial flutter: Secondary | ICD-10-CM

## 2021-07-27 DIAGNOSIS — R0600 Dyspnea, unspecified: Secondary | ICD-10-CM

## 2021-07-27 DIAGNOSIS — Q2572 Congenital pulmonary arteriovenous malformation: Secondary | ICD-10-CM

## 2021-07-27 DIAGNOSIS — R0609 Other forms of dyspnea: Secondary | ICD-10-CM

## 2021-07-27 DIAGNOSIS — I951 Orthostatic hypotension: Secondary | ICD-10-CM

## 2021-07-27 NOTE — Patient Instructions (Signed)
  Follow-Up: At [facility], you and your health needs are our priority.  As part of our continuing mission to provide you with exceptional heart care, we have created designated Provider Care Teams.  These Care Teams include your primary Cardiologist (physician) and Advanced Practice Providers (APPs -  Physician Assistants and Nurse Practitioners) who all work together to provide you with the care you need, when you need it.  We recommend signing up for the patient portal called "MyChart".  Sign up information is provided on this After Visit Summary.  MyChart is used to connect with patients for Virtual Visits (Telemedicine).  Patients are able to view lab/test results, encounter notes, upcoming appointments, etc.  Non-urgent messages can be sent to your provider as well.   To learn more about what you can do with MyChart, go to [email or web address].    Your next appointment:   3 month(s)  The format for your next appointment:   In Person  Provider:   Brian Crenshaw, MD    

## 2021-07-28 ENCOUNTER — Ambulatory Visit (INDEPENDENT_AMBULATORY_CARE_PROVIDER_SITE_OTHER): Payer: Medicare Other | Admitting: Pulmonary Disease

## 2021-07-28 ENCOUNTER — Encounter: Payer: Self-pay | Admitting: Pulmonary Disease

## 2021-07-28 VITALS — BP 118/58 | HR 74 | Temp 97.3°F | Ht 70.0 in | Wt 191.0 lb

## 2021-07-28 DIAGNOSIS — R0600 Dyspnea, unspecified: Secondary | ICD-10-CM

## 2021-07-28 DIAGNOSIS — Q2572 Congenital pulmonary arteriovenous malformation: Secondary | ICD-10-CM | POA: Diagnosis not present

## 2021-07-28 DIAGNOSIS — R0609 Other forms of dyspnea: Secondary | ICD-10-CM

## 2021-07-28 NOTE — Progress Notes (Signed)
$'@Patient'a$  ID: Gregory [name], male    DOB: [birth year], 80 y.o.   MRN: [id number]  Chief Complaint  Patient presents with   Follow-up    Still having some occ dry cough    Referring provider: Janith Lima, MD  HPI:   80 y.o. man here for follow up of chronic cough. Chief complaint today is new increased DOE and recurrent pre-syncope symptoms. Most recent cardiology note x 2 reviewed.   He is doing okay.  Resume Dulera and low-dose gabapentin 100 mg twice daily last visit.  Cough not really changed.  He stopped Dulera several weeks ago due to lack of perceived benefit.  Over the last couple months has had increased dyspnea on exertion when outside and with steps.  Has episodes of presyncope with this.  Has had work-up with cardiologist including recent TTE with normal EF, no diastolic dysfunction, RV normal with normal right-sided pressures and RA size, no significant valvular abnormalities.  Reviewed in depth his prior CT scans which showed the known large AVM but no significant parenchymal changes such as emphysema or scarring to account for his dyspnea.  Discussed relatively high suspicion for asthma given his cough response to ICS/LABA in the past.  Wonder if he worsening shortness of breath is unmasked asthma symptoms in the setting of stopping his ICS/LABA.  Discussed rationale for further investigation including the scan with contrast to evaluate AVMs which have been stable as well as PFTs to evaluate for pulmonary limitation.  HPI at initial visit: Patient has chronic cough over the last 2 years.  When asked in more detail he admits cough is been present for well over a decade.  Unclear how much change there has been since last seen by pulmonary some years ago.  Does seem that the frequency has worsened over the last couple of years.  Worse at night but occurs throughout the day.  It is nonproductive.  Uses a variety of dextromethorphan containing products that do help, he rotates  through these 2 or 3 products as they seem to wear off and affect.  He is currently on Breo does not seem to help.  Recently resumed gabapentin at low-dose, 100 mg twice daily with mild improvement.  Notes in the past he was on 200 mg twice daily of gabapentin with significant improvement however he had syncope in the setting of what sounds like polypharmacy.  Prior pulmonary notes indicate that he is cough improved on Dulera.  He identifies no other alleviating or exacerbating factors.  No environmental or seasonal factors that contribute to better or worse cough.  No position where things are better or worse.  He is on a PPI twice daily for a long history of GERD.  Does endorse occasional breakthrough symptoms of heartburn, reflux.  Reports endoscopy, manometry, pH probe in the past although these results are all available for review and he does not recall the results.  Reviewed serial CT scans dating back to 2016, with persistent right lower lobe large AVM and small left lower lobe AVM stable from scan 04/2015, 09/2018, 11/2018, 11/2020 with otherwise clear lungs on my interpretation.  PMH: HHT with pulmonary AVMs, hypertension, GERD Surgical history: Brain surgery for brain abscess, Family history: Mother with diabetes, hypertension, father with kidney failure Social history: Lives in [place], retired, went to [employer], has 3 grandchildren  Licensed conveyancer / Pulmonary Flowsheets:   ACT:  No flowsheet data found.  MMRC: No flowsheet data found.  Epworth:  No flowsheet  data found.  Tests:   FENO:  No results found for: NITRICOXIDE  PFT: No flowsheet data found.  WALK:  No flowsheet data found.  Imaging: Personally reviewed and as per EMR  Lab Results: Personally reviewed CBC    Component Value Date/Time   WBC 6.6 03/19/2021 0959   RBC 3.96 (L) 03/19/2021 0959   HGB 9.0 (L) 03/19/2021 0959   HCT 28.0 (L) 03/19/2021 0959   PLT 395.0 03/19/2021 0959   MCV 70.6 (L)  03/19/2021 0959   MCH 26.0 12/07/2020 1438   MCHC 32.2 03/19/2021 0959   RDW 16.3 (H) 03/19/2021 0959   LYMPHSABS 1.9 03/19/2021 0959   MONOABS 0.9 03/19/2021 0959   EOSABS 0.0 03/19/2021 0959   BASOSABS 0.0 03/19/2021 0959    BMET    Component Value Date/Time   NA 133 (L) 03/19/2021 0959   K 4.1 03/19/2021 0959   CL 99 03/19/2021 0959   CO2 28 03/19/2021 0959   GLUCOSE 99 03/19/2021 0959   BUN 17 03/19/2021 0959   CREATININE 1.03 03/19/2021 0959   CALCIUM 9.3 03/19/2021 0959   GFRNONAA >60 12/07/2020 1438   GFRAA >60 06/01/2020 1106    BNP    Component Value Date/Time   BNP 63.5 12/07/2020 1439    ProBNP    Component Value Date/Time   PROBNP 126.0 (H) 03/19/2021 0959    Specialty Problems       Pulmonary Problems   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     Immunization History  Administered Date(s) Administered   Fluad Quad(high Dose 65+) 08/24/2019, 10/02/2020   Influenza Split 09/21/2011, 09/27/2012   Influenza Whole 09/11/2008, 09/11/2009, 09/22/2010   Influenza, High Dose Seasonal PF 09/09/2016, 09/16/2017, 10/03/2018   Influenza,inj,Quad PF,6+ Mos 10/04/2013, 10/08/2014, 10/02/2015   PFIZER Comirnaty(Gray Top)Covid-19 Tri-Sucrose Vaccine 06/08/2021   PFIZER(Purple Top)SARS-COV-2 Vaccination 01/03/2020, 01/23/2020, 10/18/2020   Pneumococcal Conjugate-13 12/07/2016   Pneumococcal Polysaccharide-23 03/12/2010,  06/27/2018   Tdap 10/04/2013   Varicella 12/20/2009    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   History of nonmelanoma skin cancer EXCISION SQUAMOUS CELL FROM HAND   Hypertrophy of prostate with urinary obstruction and other lower urinary tract symptoms (LUTS)    Irritable bowel syndrome    Lumbago    Other diseases of nasal cavity and sinuses(478.19)    Persistent disorder of initiating or maintaining sleep    Plantar fascial fibromatosis    Pure hypercholesterolemia    Telangiectasia, hereditary hemorrhagic, of Rendu, Osler and Weber ([place]) OLSER'S DISEASE  (OWR)   SKIN, LIPS, NASAL W/ PREVIOUS NOSE BLEEDS  AMD GI TELANGIECTASIA   Unspecified hypothyroidism     Tobacco History: Social History   Tobacco Use  Smoking Status Never  Smokeless Tobacco Never   Counseling given: Yes   Continue to not smoke  Outpatient Encounter Medications as of 07/28/2021  Medication Sig   acetaminophen (TYLENOL) 325 MG tablet Take 650 mg by mouth every 6 (six) hours as needed for mild pain.   amoxicillin (AMOXIL) 500 MG capsule SMARTSIG:4 Capsule(s) By Mouth PRN   Cholecalciferol (VITAMIN D3) 2000 UNITS TABS Take 5,000 Units by mouth daily.    clidinium-chlordiazePOXIDE (LIBRAX) 5-2.5 MG capsule Take 1 capsule by mouth 3 (three) times daily before meals. (Patient taking differently: Take 1 capsule by mouth 3 (three) times daily before meals. AS NEEDED 3 TIMES DAILY)   clobetasol ointment (TEMOVATE) 0.05 % Apply topically 2 (two) times daily.   COVID-19 mRNA Vac-TriS, Pfizer, (PFIZER-BIONT COVID-19 VAC-TRIS) SUSP injection Inject into the muscle.   Dextromethorphan-guaiFENesin (ROBITUSSIN DM PO) Take by mouth. Taking cough gel as needed   gabapentin (NEURONTIN) 100 MG capsule Take 1 capsule (100 mg total) by  mouth 2 (two) times daily.   levocetirizine (XYZAL) 5 MG tablet Take 1 tablet (5 mg total) by mouth every evening.   levothyroxine (SYNTHROID) 125 MCG tablet Take 1 tablet (125 mcg total) by mouth daily before breakfast.   Magnesium 250 MG TABS Take 250 mg by mouth at bedtime.    meclizine (ANTIVERT) 25 MG tablet Take 25 mg by mouth 2 (two) times daily as needed for dizziness.   methocarbamol (ROBAXIN) 500 MG tablet Take 500 mg by mouth every 6 (six) hours as needed.   mometasone-formoterol (DULERA) 200-5 MCG/ACT AERO Inhale 2 puffs into the lungs in the morning and at bedtime.   montelukast (SINGULAIR) 10 MG tablet TAKE 1 TABLET BY MOUTH AT  BEDTIME   Multiple Vitamins-Minerals (MULTI FOR HIM 50+) TABS Take 1 tablet by mouth daily.   pantoprazole (PROTONIX) 40 MG tablet Take 1 tablet (40 mg total) by mouth 2 (two) times daily.   silodosin (RAPAFLO) 8 MG CAPS capsule Take 1 capsule (8 mg total) by mouth at bedtime. TAKE ONE CAPSULE BY MOUTH DAILY AT BEDTIME   No facility-administered encounter medications on file as of 07/28/2021.     Review of Systems  Review of Systems  No chest pain with exertion.  No orthopnea or PND.  No dysphagia, odynophagia.  Comprehensive review of systems otherwise negative. Physical Exam  BP (!) 118/58 (BP Location: Left Arm, Cuff Size: Normal)   Pulse 74   Temp (!) 97.3 F (36.3 C) (Oral)   Ht '5\' 10"'$  (1.778 m)   Wt 191 lb (86.6 kg)   SpO2 92%   BMI 27.41 kg/m   Wt Readings from Last 5 Encounters:  07/28/21 191 lb (86.6 kg)  07/27/21 192 lb 9.6 oz (87.4 kg)  05/08/21 190 lb (86.2 kg)  04/23/21 192 lb (87.1 kg)  04/23/21 190 lb (86.2 kg)    BMI Readings from Last 5 Encounters:  07/28/21 27.41 kg/m  07/27/21 27.64 kg/m  05/08/21 27.26 kg/m  04/23/21 27.55 kg/m  04/23/21 27.26 kg/m     Physical Exam General: Well-appearing, in no acute distress Eyes: EOMI, no icterus Neck: Supple no JVP Cardiovascular: Regular in rhythm, no  murmurs Pulmonary: Clear to auscultation bilaterally, no wheezing Abdomen: Nondistended, bowel sounds present MSK: No synovitis, no joint effusion Neuro: Normal gait, no weakness Psych: Normal mood, full affect   Assessment & Plan:   Chronic Cough: Likely multifactorial. Improved with Dulera in past although more recently Breo did not help. Begs question of cough  variant asthma.  Did not improve with high-dose Dulera or low-dose gabapentin.  Would consider increasing gabapentin the future although he did have presyncope with higher doses in the past.  His dyspnea on exertion deserves more urgent evaluation as below.  DOE: Worse in the last few months.  High suspicion for deconditioning.  Possible asthma as well given his nasal congestion as well as partial response to cough with ICS/LABA in the past.  Seems timing wise the dyspnea is worse after stopping Dulera.  Will obtain CT chest with contrast to evaluate for change in AVMs.  In addition, will obtain PFTs.  Instructed him to resume Dulera after PFTs performed.  GERD: Ok but with breakthrough symptoms. Has had manometry, pH probe but 10+ years in past. Consider re-engagement with GI for repeat work up as suspect is contributor to cough.  Pulmonary AVMs: Given worsening dyspnea repeating CT chest with contrast to evaluate change in AVMs as above.   Return in about 3 months (around 10/28/2021).   Lanier Clam, MD 07/28/2021  I spent 42 minutes in care of this patient including face-to-face visit, review of records, coordination of care.

## 2021-07-28 NOTE — Patient Instructions (Signed)
Nice to see you again  To further evaluate the shortness of breath, I recommend a CT scan with contrast to keep an eye on the pulmonary AVM that we know about.  In addition, we will get pulmonary function tests or breathing test in the coming days to evaluate the function of your lungs.  Fortunately, on the CT scans in the past your lungs have looked very normal.  I am a bit suspicious of something like asthma.  After the PFTs were performed, I recommend going back on the [facility] as previously prescribed to see if it helps with some of the shortness of breath.  I will let you know the results and next steps as they come in.  Feel free to send me a message if you have not heard back from me in a timely manner.  Return to clinic in 3 months or sooner as needed with Dr. Silas Flood.

## 2021-07-31 ENCOUNTER — Other Ambulatory Visit: Payer: Self-pay

## 2021-07-31 ENCOUNTER — Ambulatory Visit (INDEPENDENT_AMBULATORY_CARE_PROVIDER_SITE_OTHER): Payer: Medicare Other | Admitting: Pulmonary Disease

## 2021-07-31 DIAGNOSIS — R0609 Other forms of dyspnea: Secondary | ICD-10-CM

## 2021-07-31 DIAGNOSIS — R0600 Dyspnea, unspecified: Secondary | ICD-10-CM | POA: Diagnosis not present

## 2021-07-31 LAB — PULMONARY FUNCTION TEST
DL/VA % pred: 57 %
DL/VA: 2.25 ml/min/mmHg/L
DLCO cor % pred: 43 %
DLCO cor: 10.47 ml/min/mmHg
DLCO unc % pred: 43 %
DLCO unc: 10.47 ml/min/mmHg
FEF 25-75 Post: 2.55 L/sec
FEF 25-75 Pre: 2.32 L/sec
FEF2575-%Change-Post: 10 %
FEF2575-%Pred-Post: 125 %
FEF2575-%Pred-Pre: 114 %
FEV1-%Change-Post: 1 %
FEV1-%Pred-Post: 82 %
FEV1-%Pred-Pre: 81 %
FEV1-Post: 2.38 L
FEV1-Pre: 2.34 L
FEV1FVC-%Change-Post: 4 %
FEV1FVC-%Pred-Pre: 111 %
FEV6-%Change-Post: -3 %
FEV6-%Pred-Post: 74 %
FEV6-%Pred-Pre: 77 %
FEV6-Post: 2.81 L
FEV6-Pre: 2.9 L
FEV6FVC-%Change-Post: 0 %
FEV6FVC-%Pred-Post: 107 %
FEV6FVC-%Pred-Pre: 106 %
FVC-%Change-Post: -3 %
FVC-%Pred-Post: 69 %
FVC-%Pred-Pre: 72 %
FVC-Post: 2.81 L
FVC-Pre: 2.9 L
Post FEV1/FVC ratio: 84 %
Post FEV6/FVC ratio: 100 %
Pre FEV1/FVC ratio: 81 %
Pre FEV6/FVC Ratio: 100 %
RV % pred: 85 %
RV: 2.21 L
TLC % pred: 72 %
TLC: 5.07 L

## 2021-07-31 NOTE — Progress Notes (Signed)
PFT done today. 

## 2021-08-17 ENCOUNTER — Other Ambulatory Visit: Payer: Self-pay

## 2021-08-17 ENCOUNTER — Ambulatory Visit
Admission: RE | Admit: 2021-08-17 | Discharge: 2021-08-17 | Disposition: A | Discharge status: Home / Self Care | Payer: Medicare Other | Source: Ambulatory Visit | Attending: Pulmonary Disease | Admitting: Pulmonary Disease

## 2021-08-17 DIAGNOSIS — Q2572 Congenital pulmonary arteriovenous malformation: Secondary | ICD-10-CM

## 2021-08-17 MED ORDER — IOPAMIDOL (ISOVUE-300) INJECTION 61%
75.0000 mL | Freq: Once | INTRAVENOUS | Status: AC | PRN
  Administered 2021-08-17: 75 mL via INTRAVENOUS

## 2021-08-18 ENCOUNTER — Telehealth: Payer: Self-pay | Admitting: Cardiology

## 2021-08-18 DIAGNOSIS — R0609 Other forms of dyspnea: Secondary | ICD-10-CM

## 2021-08-18 DIAGNOSIS — Z01818 Encounter for other preprocedural examination: Secondary | ICD-10-CM

## 2021-08-18 DIAGNOSIS — R0600 Dyspnea, unspecified: Secondary | ICD-10-CM

## 2021-08-18 NOTE — Telephone Encounter (Signed)
Spoke with pt, aware order placed for nuclear test.

## 2021-08-18 NOTE — Telephone Encounter (Signed)
Message routed to [facility] regarding this request

## 2021-08-18 NOTE — Telephone Encounter (Signed)
Patient is requesting an order to have a stress test prior to a procedure. He states the office performing the procedure will need clearance. I informed him we will need a formal clearance request from that office. He understood and plans to contact them, but states he would still like to speak with Dr. Jacalyn Lefevre nurse regarding having a stress test. He states this was discussed when his CT (08/29) was ordered.

## 2021-08-19 ENCOUNTER — Telehealth: Payer: Self-pay | Admitting: Internal Medicine

## 2021-08-19 NOTE — Telephone Encounter (Signed)
Patient scheduled for surgery in 2 weeks, and received notice that his hemoglobin is low, they want to do a transfusion before surgery, patient want to get another lab to confirm  Please contact the patient with advice and plan

## 2021-08-20 ENCOUNTER — Encounter: Payer: Self-pay | Admitting: Emergency Medicine

## 2021-08-20 ENCOUNTER — Other Ambulatory Visit: Payer: Self-pay

## 2021-08-20 ENCOUNTER — Telehealth: Payer: Self-pay

## 2021-08-20 ENCOUNTER — Ambulatory Visit (INDEPENDENT_AMBULATORY_CARE_PROVIDER_SITE_OTHER): Payer: Medicare Other | Admitting: Emergency Medicine

## 2021-08-20 ENCOUNTER — Telehealth (HOSPITAL_COMMUNITY): Payer: Self-pay

## 2021-08-20 VITALS — BP 140/60 | HR 71 | Temp 98.0°F | Ht 70.0 in | Wt 191.0 lb

## 2021-08-20 DIAGNOSIS — D649 Anemia, unspecified: Secondary | ICD-10-CM

## 2021-08-20 DIAGNOSIS — E871 Hypo-osmolality and hyponatremia: Secondary | ICD-10-CM

## 2021-08-20 DIAGNOSIS — Z8719 Personal history of other diseases of the digestive system: Secondary | ICD-10-CM | POA: Diagnosis not present

## 2021-08-20 LAB — COMPREHENSIVE METABOLIC PANEL
ALT: 9 U/L (ref 0–53)
AST: 14 U/L (ref 0–37)
Albumin: 4.3 g/dL (ref 3.5–5.2)
Alkaline Phosphatase: 51 U/L (ref 39–117)
BUN: 12 mg/dL (ref 6–23)
CO2: 26 mEq/L (ref 19–32)
Calcium: 9 mg/dL (ref 8.4–10.5)
Chloride: 90 mEq/L — ABNORMAL LOW (ref 96–112)
Creatinine, Ser: 1.02 mg/dL (ref 0.40–1.50)
GFR: 70.23 mL/min (ref >60.00)
Glucose, Bld: 94 mg/dL (ref 70–99)
Potassium: 4.5 mEq/L (ref 3.5–5.1)
Sodium: 122 mEq/L — ABNORMAL LOW (ref 135–145)
Total Bilirubin: 0.4 mg/dL (ref 0.2–1.2)
Total Protein: 6.7 g/dL (ref 6.0–8.3)

## 2021-08-20 LAB — CBC WITH DIFFERENTIAL/PLATELET
Basophils Relative: 0.8 % (ref 0.0–3.0)
Eosinophils Relative: 0.4 % (ref 0.0–5.0)
HCT: 21.8 % — CL (ref 39.0–52.0)
Hemoglobin: 6.6 g/dL — CL (ref 13.0–17.0)
Lymphocytes Relative: 37.7 % (ref 12.0–46.0)
MCHC: 30.1 g/dL (ref 30.0–36.0)
MCV: 59 fl — ABNORMAL LOW (ref 78.0–100.0)
Monocytes Relative: 11.1 % (ref 3.0–12.0)
Neutrophils Relative %: 50 % (ref 43.0–77.0)
Platelets: 377 10*3/uL (ref 150.0–400.0)
RBC: 3.7 Mil/uL — ABNORMAL LOW (ref 4.22–5.81)
RDW: 18.9 % — ABNORMAL HIGH (ref 11.5–15.5)
WBC: 7.1 10*3/uL (ref 4.0–10.5)

## 2021-08-20 LAB — FERRITIN: Ferritin: 3.5 ng/mL — ABNORMAL LOW (ref 22.0–322.0)

## 2021-08-20 NOTE — Patient Instructions (Signed)
Anemia Anemia is a condition in which there is not enough red blood cells or hemoglobin in the blood. Hemoglobin is a substance in red blood cells that carries oxygen. When you do not have enough red blood cells or hemoglobin (are anemic), your body cannot get enough oxygen and your organs may not work properly. As a result, you may feel very tired or have other problems. What are the causes? Common causes of anemia include: Excessive bleeding. Anemia can be caused by excessive bleeding inside or outside the body, including bleeding from the intestines or from heavy menstrual periods in females. Poor nutrition. Long-lasting (chronic) kidney, thyroid, and liver disease. Bone marrow disorders, spleen problems, and blood disorders. Cancer and treatments for cancer. HIV (human immunodeficiency virus) and AIDS (acquired immunodeficiency syndrome). Infections, medicines, and autoimmune disorders that destroy red blood cells. What are the signs or symptoms? Symptoms of this condition include: Minor weakness. Dizziness. Headache, or difficulties concentrating and sleeping. Heartbeats that feel irregular or faster than normal (palpitations). Shortness of breath, especially with exercise. Pale skin, lips, and nails, or cold hands and feet. Indigestion and nausea. Symptoms may occur suddenly or develop slowly. If your anemia is mild, you may not have symptoms. How is this diagnosed? This condition is diagnosed based on blood tests, your medical history, and a physical exam. In some cases, a test may be needed in which cells are removed from the soft tissue inside of a bone and looked at under a microscope (bone marrow biopsy). Your health care provider may also check your stool (feces) for blood and may do additional testing to look for the cause of your bleeding. Other tests may include: Imaging tests, such as a CT scan or MRI. A procedure to see inside your esophagus and stomach (endoscopy). A  procedure to see inside your colon and rectum (colonoscopy). How is this treated? Treatment for this condition depends on the cause. If you continue to lose a lot of blood, you may need to be treated at a hospital. Treatment may include: Taking supplements of iron, vitamin B12, or folic acid. Taking a hormone medicine (erythropoietin) that can help to stimulate red blood cell growth. Having a blood transfusion. This may be needed if you lose a lot of blood. Making changes to your diet. Having surgery to remove your spleen. Follow these instructions at home: Take over-the-counter and prescription medicines only as told by your health care provider. Take supplements only as told by your health care provider. Follow any diet instructions that you were given by your health care provider. Keep all follow-up visits as told by your health care provider. This is important. Contact a health care provider if: You develop new bleeding anywhere in the body. Get help right away if: You are very weak. You are short of breath. You have pain in your abdomen or chest. You are dizzy or feel faint. You have trouble concentrating. You have bloody stools, black stools, or tarry stools. You vomit repeatedly or you vomit up blood. These symptoms may represent a serious problem that is an emergency. Do not wait to see if the symptoms will go away. Get medical help right away. Call your local emergency services (911 in the [place] Do not drive yourself to the hospital. Summary Anemia is a condition in which you do not have enough red blood cells or enough of a substance in your red blood cells that carries oxygen (hemoglobin). Symptoms may occur suddenly or develop slowly. If your anemia is   mild, you may not have symptoms. This condition is diagnosed with blood tests, a medical history, and a physical exam. Other tests may be needed. Treatment for this condition depends on the cause of the anemia. This  information is not intended to replace advice given to you by your health care provider. Make sure you discuss any questions you have with your health care provider. Document Revised: 11/13/2019 Document Reviewed: 11/13/2019 Elsevier Patient Education  2022 [employer]  

## 2021-08-20 NOTE — Telephone Encounter (Signed)
CRITICAL VALUE STICKER  CRITICAL VALUE: Hgb 6.6    Hct: 21.8  RECEIVER (on-site recipient of call): Lovena Le A. Alroy Dust, CMA  DATE & TIME NOTIFIED: 08/20/21 4:51p  MESSENGER (representative from lab): Delorise Jackson  MD NOTIFIED: yes, Dr. Ronnald Ramp  TIME OF NOTIFICATION: 4:51p  RESPONSE:  waiting for response

## 2021-08-20 NOTE — Telephone Encounter (Signed)
Spoke with the patient, detailed instructions given. He stated that he understood and would be here for his test. Asked to call back with any questions. Gregory [name] EMTP 

## 2021-08-20 NOTE — Telephone Encounter (Signed)
Pt has been scheduled today with Dr. Mitchel Honour @ 1.40pm

## 2021-08-20 NOTE — Progress Notes (Signed)
Gregory [name] 80 y.o.   Chief Complaint  Patient presents with   Low hemoglobin    Pt is scheduled for surgery soon and was told his hemoglobin was low and wants to recheck it. Wants to know why it is low and what can be done to help.    HISTORY OF PRESENT ILLNESS: This is a 80 y.o. male patient of Dr. Scarlette Calico, here for recent finding of anemia. Scheduled for TURP on 09/01/2021. Preop blood work 08/18/2021 shows very low hemoglobin WBC 4.4 - 11.0 x 10*3/uL 6.5   RBC 4.50 - 5.90 x 10*6/uL 3.83 Low    Hemoglobin 14.0 - 17.5 G/DL 6.8 Low    Hematocrit 41.5 - 50.4 % 22.4 Low    MCV 80.0 - 96.0 FL 58.6 Low    MCH 27.5 - 33.2 PG 17.8 Low    MCHC 33.0 - 37.0 G/DL 30.4 Low    RDW 12.3 - 17.0 % 18.8 High    Colonoscopy done on 06/29/2021 shows diverticulosis. Patient has been complaining of dizziness and shortness of breath for the past several months. Asymptomatic at present time.  HPI   Prior to Admission medications   Medication Sig Start Date End Date Taking? Authorizing Provider  acetaminophen (TYLENOL) 325 MG tablet Take 650 mg by mouth every 6 (six) hours as needed for mild pain.   Yes [provider]  amoxicillin (AMOXIL) 500 MG capsule SMARTSIG:4 Capsule(s) By Mouth PRN 10/23/20  Yes Janith Lima, MD  Cholecalciferol (VITAMIN D3) 2000 UNITS TABS Take 5,000 Units by mouth daily.    Yes [provider]  clidinium-chlordiazePOXIDE (LIBRAX) 5-2.5 MG capsule Take 1 capsule by mouth 3 (three) times daily before meals. Patient taking differently: Take 1 capsule by mouth 3 (three) times daily before meals. AS NEEDED 3 TIMES DAILY 10/23/20  Yes Janith Lima, MD  clobetasol ointment (TEMOVATE) 0.05 % Apply topically 2 (two) times daily. 05/12/20  Yes [provider]  COVID-19 mRNA Vac-TriS, Pfizer, (PFIZER-BIONT COVID-19 VAC-TRIS) SUSP injection Inject into the muscle. 06/08/21  Yes Carlyle Basques, MD  Dextromethorphan-guaiFENesin (ROBITUSSIN DM PO) Take  by mouth. Taking cough gel as needed   Yes [provider]  gabapentin (NEURONTIN) 100 MG capsule Take 1 capsule (100 mg total) by mouth 2 (two) times daily. 06/22/21  Yes Janith Lima, MD  levocetirizine (XYZAL) 5 MG tablet Take 1 tablet (5 mg total) by mouth every evening. 06/27/18  Yes Janith Lima, MD  levothyroxine (SYNTHROID) 125 MCG tablet Take 1 tablet (125 mcg total) by mouth daily before breakfast. 04/23/21  Yes Janith Lima, MD  Magnesium 250 MG TABS Take 250 mg by mouth at bedtime.    Yes [provider]  meclizine (ANTIVERT) 25 MG tablet Take 25 mg by mouth 2 (two) times daily as needed for dizziness.   Yes [provider]  methocarbamol (ROBAXIN) 500 MG tablet Take 500 mg by mouth every 6 (six) hours as needed. 07/29/20  Yes [provider]  mometasone-formoterol (DULERA) 200-5 MCG/ACT AERO Inhale 2 puffs into the lungs in the morning and at bedtime. 04/23/21  Yes Hunsucker, Bonna Gains, MD  montelukast (SINGULAIR) 10 MG tablet TAKE 1 TABLET BY MOUTH AT  BEDTIME 03/06/21  Yes Janith Lima, MD  Multiple Vitamins-Minerals [facility] FOR HIM 50+) TABS Take 1 tablet by mouth daily.   Yes [provider]  pantoprazole (PROTONIX) 40 MG tablet Take 1 tablet (40 mg total) by mouth 2 (two) times  daily. 04/23/21  Yes Janith Lima, MD  silodosin (RAPAFLO) 8 MG CAPS capsule Take 1 capsule (8 mg total) by mouth at bedtime. TAKE ONE CAPSULE BY MOUTH DAILY AT BEDTIME 04/23/21  Yes Janith Lima, MD    Allergies  Allergen Reactions   Nsaids Other (See Comments)    Cannot take-per MD   Other Other (See Comments)    All blood thinners-cannot take per MD   Aspirin Other (See Comments)    nosebleeds   Statins Other (See Comments)    Weakness, myalgias   Sulfa Antibiotics    Tizanidine Hcl Other (See Comments)    Patient stated that after taking this medication he experienced feelings of dizziness and disorientation.     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   Seasonal allergic rhinitis due to pollen 06/27/2018   Peripheral neuropathy 01/01/2016   Atrial flutter ([place]) 08/07/2015   Encounter for therapeutic drug monitoring    Osteoarthritis 04/01/2015   Iron deficiency anemia due to chronic blood loss 10/08/2014   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 03/50/0938   HYPERCHOLESTEROLEMIA, MILD 07/25/2008   CARCINOMA, SKIN, SQUAMOUS CELL 02/10/2008   INSOMNIA, CHRONIC 02/10/2008   GERD 02/10/2008   Benign prostatic hyperplasia with urinary obstruction 02/10/2008   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   Hypothyroidism 02/08/2008   Irritable bowel syndrome 02/08/2008   BACK PAIN, LUMBAR 02/08/2008    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   History of nonmelanoma skin cancer EXCISION SQUAMOUS CELL FROM HAND   Hypertrophy of prostate with urinary obstruction and other lower urinary tract symptoms (LUTS)    Irritable bowel syndrome    Lumbago    Other diseases of nasal cavity and sinuses(478.19)    Persistent disorder of initiating or maintaining sleep    Plantar fascial fibromatosis    Pure hypercholesterolemia    Telangiectasia, hereditary hemorrhagic, of Rendu, 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   ([facility])   SKIN, LIPS, NASAL W/ PREVIOUS NOSE BLEEDS  AMD GI TELANGIECTASIA   Unspecified hypothyroidism     Past Surgical History:  Procedure Laterality Date   APPLICATION OF CRANIAL NAVIGATION N/A 05/16/2015   Procedure: APPLICATION OF CRANIAL NAVIGATION;  Surgeon: Consuella Lose, MD;  Location: [facility] NEURO ORS;  Service: Neurosurgery;  Laterality: N/A;   BRAIN BIOPSY Left 05/16/2015   Procedure: Stereotactic Left Brain Biopsy with Brain Lab;  Surgeon: Consuella Lose, MD;  Location: [place] NEURO ORS;  Service: Neurosurgery;  Laterality: Left;  Stereotactic Left Brain biopsy with brainlab   BRAIN SURGERY     CARDIOVASCULAR STRESS TEST  01-14-2003   NO ISCHEMIA / EF 61%/ NORMAL LE WALL MOTION   EXCISION LEFT WRIST GANGLIAN/ MYXOID CYST  05-30-2009   IR RADIOLOGIST EVAL & MGMT  02/01/2019   NASAL SINUS SURGERY     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 Renda Rolls   TRANSTHORACIC ECHOCARDIOGRAM  10-17-2008   DR CRENSHAW   NORMAL LVF/ EF 60%/  MILDLY DILATED RIGHT ATRIUM/ VENTRICULE   VARICOCELECTOMY  1991   Dr. Tresa Endo    Social History   Socioeconomic History   Marital status: Married    Spouse name: [name]   Number of children: 2   Years of education: Not on file   Highest education level: Master's degree (e.g., MA, MS, MEng, MEd, MSW, MBA)  Occupational History    Comment: Consultant  Tobacco Use   Smoking status: Never   Smokeless tobacco: Never  Vaping Use   Vaping Use: Never used  Substance and Sexual Activity   Alcohol use: No    Alcohol/week: 0.0 standard drinks   Drug use: No   Sexual activity: Not on file  Other Topics Concern   Not on file  Social History Narrative   Lives with wife in a one story home.  Has 2 sons.  Retired Veterinary surgeon.  Education: Masters degree.      05/09/19- Pt states that his  balance is worse since last visit. He has not fallen but has come close on a few occassions. The weakness on his right side is the same, he states.      Right Handed   Drinks caffeine    Social Determinants of Health   Financial Resource Strain: Low Risk    Difficulty of Paying Living Expenses: Not hard at all  Food Insecurity: Not on file  Transportation Needs: Not on file  Physical Activity: Not on file  Stress: Not on file  Social Connections: Not on file  Intimate Partner Violence: Not on file    Family History  Problem Relation Age of Onset   Diabetes Mother    Hypertension Mother    Pancreatic cancer Mother    Stroke Mother    Kidney failure Father    Hypertension Sister    Healthy Son      Review of Systems  Constitutional: Negative.  Negative for chills and fever.  HENT:  Negative for congestion and sore throat.   Respiratory:  Negative for cough and shortness of breath.   Cardiovascular:  Negative for chest pain and palpitations.  Gastrointestinal:  Negative for abdominal pain, blood in stool, melena, nausea and vomiting.  Skin: Negative.  Negative for rash.  Neurological:  Negative for dizziness and headaches.  All other systems reviewed and are negative.  Today's Vitals   08/20/21 1332  BP: 140/60  Pulse: 71  Temp: 98 F (36.7 C)  TempSrc: Oral  SpO2: 91%  Weight: 191 lb (86.6 kg)  Height: 5' 10"  (1.778 m)   Body mass index is 27.41 kg/m.  Physical Exam Vitals reviewed.  Constitutional:      Appearance: Normal appearance.  HENT:     Head: Normocephalic.  Eyes:     Extraocular Movements: Extraocular movements intact.     Pupils: Pupils are equal, round, and reactive to light.  Cardiovascular:     Rate and Rhythm: Normal rate and regular rhythm.     Pulses: Normal pulses.     Heart sounds: Normal heart sounds.  Pulmonary:     Effort: Pulmonary effort is normal.     Breath sounds: Normal breath sounds.  Abdominal:  Palpations: Abdomen  is soft.     Tenderness: There is no abdominal tenderness.  Skin:    General: Skin is warm and dry.     Capillary Refill: Capillary refill takes less than 2 seconds.     Comments: Pale  Neurological:     General: No focal deficit present.     Mental Status: He is alert and oriented to person, place, and time.  Psychiatric:        Mood and Affect: Mood normal.        Behavior: Behavior normal.     ASSESSMENT & PLAN: Clinically stable.  Stable vital signs.  We will repeat blood work today.  Recent CBC results are very likely to be real based on physical findings.  We will do anemia work-up and check for iron/ferritin levels.  Most likely he will need a blood transfusion before proceeding with TURP.  Tyshaun was seen today for low hemoglobin.  Diagnoses and all orders for this visit:  Anemia, unspecified type -     CBC with Differential/Platelet -     Comprehensive metabolic panel -     Iron and TIBC -     Ferritin  Hyponatremia -     Comprehensive metabolic panel  History of diverticulosis  Patient Instructions  Anemia Anemia is a condition in which there is not enough red blood cells or hemoglobin in the blood. Hemoglobin is a substance in red blood cells that carries oxygen. When you do not have enough red blood cells or hemoglobin (are anemic), your body cannot get enough oxygen and your organs may not work properly. As a result, you may feel very tired or have other problems. What are the causes? Common causes of anemia include: Excessive bleeding. Anemia can be caused by excessive bleeding inside or outside the body, including bleeding from the intestines or from heavy menstrual periods in females. Poor nutrition. Long-lasting (chronic) kidney, thyroid, and liver disease. Bone marrow disorders, spleen problems, and blood disorders. Cancer and treatments for cancer. HIV (human immunodeficiency virus) and AIDS (acquired immunodeficiency syndrome). Infections, medicines,  and autoimmune disorders that destroy red blood cells. What are the signs or symptoms? Symptoms of this condition include: Minor weakness. Dizziness. Headache, or difficulties concentrating and sleeping. Heartbeats that feel irregular or faster than normal (palpitations). Shortness of breath, especially with exercise. Pale skin, lips, and nails, or cold hands and feet. Indigestion and nausea. Symptoms may occur suddenly or develop slowly. If your anemia is mild, you may not have symptoms. How is this diagnosed? This condition is diagnosed based on blood tests, your medical history, and a physical exam. In some cases, a test may be needed in which cells are removed from the soft tissue inside of a bone and looked at under a microscope (bone marrow biopsy). Your health care provider may also check your stool (feces) for blood and may do additional testing to look for the cause of your bleeding. Other tests may include: Imaging tests, such as a CT scan or MRI. A procedure to see inside your esophagus and stomach (endoscopy). A procedure to see inside your colon and rectum (colonoscopy). How is this treated? Treatment for this condition depends on the cause. If you continue to lose a lot of blood, you may need to be treated at a hospital. Treatment may include: Taking supplements of iron, vitamin U38, or folic acid. Taking a hormone medicine (erythropoietin) that can help to stimulate red blood cell growth. Having a blood transfusion. This  may be needed if you lose a lot of blood. Making changes to your diet. Having surgery to remove your spleen. Follow these instructions at home: Take over-the-counter and prescription medicines only as told by your health care provider. Take supplements only as told by your health care provider. Follow any diet instructions that you were given by your health care provider. Keep all follow-up visits as told by your health care provider. This is  important. Contact a health care provider if: You develop new bleeding anywhere in the body. Get help right away if: You are very weak. You are short of breath. You have pain in your abdomen or chest. You are dizzy or feel faint. You have trouble concentrating. You have bloody stools, black stools, or tarry stools. You vomit repeatedly or you vomit up blood. These symptoms may represent a serious problem that is an emergency. Do not wait to see if the symptoms will go away. Get medical help right away. Call your local emergency services (911 in the [place] Do not drive yourself to the hospital. Summary Anemia is a condition in which you do not have enough red blood cells or enough of a substance in your red blood cells that carries oxygen (hemoglobin). Symptoms may occur suddenly or develop slowly. If your anemia is mild, you may not have symptoms. This condition is diagnosed with blood tests, a medical history, and a physical exam. Other tests may be needed. Treatment for this condition depends on the cause of the anemia. This information is not intended to replace advice given to you by your health care provider. Make sure you discuss any questions you have with your health care provider. Document Revised: 11/13/2019 Document Reviewed: 11/13/2019 Elsevier Patient Education  2022 [place], MD [facility]

## 2021-08-21 ENCOUNTER — Telehealth: Payer: Self-pay | Admitting: Emergency Medicine

## 2021-08-21 ENCOUNTER — Other Ambulatory Visit: Payer: Self-pay | Admitting: Internal Medicine

## 2021-08-21 ENCOUNTER — Telehealth: Payer: Self-pay | Admitting: Internal Medicine

## 2021-08-21 DIAGNOSIS — D500 Iron deficiency anemia secondary to blood loss (chronic): Secondary | ICD-10-CM

## 2021-08-21 LAB — IRON AND TIBC
Iron Saturation: 2 % — CL (ref 15–55)
Iron: 9 ug/dL — CL (ref 38–169)
Total Iron Binding Capacity: 399 ug/dL (ref 250–450)
UIBC: 390 ug/dL — ABNORMAL HIGH (ref 111–343)

## 2021-08-21 NOTE — Telephone Encounter (Signed)
Pt has been informed to present to the ER for evaluation and treatment for transfusion due to low hemoglobin level. He agreed with the recommendation and will go to the ER.

## 2021-08-21 NOTE — Telephone Encounter (Signed)
Manuela Schwartz has called back stating after she reviewed my note from our first interaction that the conversation was misinterpreted. She believes it would be advisable to inform the pt to be seen at the ER over the weekend to get to the root of the cause to see if he is having any internal bleeding due to his medical history. Please advise if you agree with the recommendation. I will inform the pt.

## 2021-08-21 NOTE — Telephone Encounter (Signed)
Patient says he was told his hemoglobin was low & a transfusion was needed  Patient wants to know if that is something the provider sets up or if that's something that he sets up himself  & also wants to know if its best to get it done before or after his current scheduled surgery  Please follow up w/ patient [phone]

## 2021-08-21 NOTE — Telephone Encounter (Signed)
Pt has been informed that referral was entered.  

## 2021-08-21 NOTE — Telephone Encounter (Signed)
Blood results discussed with patient.  Recommend to go to ER at [facility] sometime this weekend for evaluation and treatment of both anemia and hyponatremia.

## 2021-08-21 NOTE — Telephone Encounter (Signed)
[name] location, called stating they are happy to see the pt, however, would not be able to see the him until the 2nd week of September due to only one provider at that office. If the pt needs more immediate attention per recent lab work she recommends that the referral be sent to [facility] for an infusion so the pt could be seen sooner.   PCP out of office and recently seen provider is out of office as well. Please advise if you can change the referral. Thank you.

## 2021-08-21 NOTE — Telephone Encounter (Signed)
His hemoglobin is very low.  Unfortunately nothing is going to happen in the next few days.  Because of his degree of anemia on the symptoms he is having and then he needs to go to the emergency room where they can transfuse him blood.

## 2021-08-22 ENCOUNTER — Other Ambulatory Visit: Payer: Self-pay

## 2021-08-22 ENCOUNTER — Observation Stay (HOSPITAL_BASED_OUTPATIENT_CLINIC_OR_DEPARTMENT_OTHER)
Admission: EM | Admit: 2021-08-22 | Discharge: 2021-08-23 | Disposition: A | Discharge status: Home / Self Care | Payer: Medicare Other | Attending: Internal Medicine | Admitting: Internal Medicine

## 2021-08-22 ENCOUNTER — Encounter (HOSPITAL_BASED_OUTPATIENT_CLINIC_OR_DEPARTMENT_OTHER): Payer: Self-pay | Admitting: *Deleted

## 2021-08-22 DIAGNOSIS — Z20822 Contact with and (suspected) exposure to covid-19: Secondary | ICD-10-CM | POA: Insufficient documentation

## 2021-08-22 DIAGNOSIS — Z79899 Other long term (current) drug therapy: Secondary | ICD-10-CM | POA: Insufficient documentation

## 2021-08-22 DIAGNOSIS — E871 Hypo-osmolality and hyponatremia: Secondary | ICD-10-CM | POA: Diagnosis not present

## 2021-08-22 DIAGNOSIS — D649 Anemia, unspecified: Principal | ICD-10-CM | POA: Diagnosis present

## 2021-08-22 DIAGNOSIS — Z85828 Personal history of other malignant neoplasm of skin: Secondary | ICD-10-CM | POA: Diagnosis not present

## 2021-08-22 DIAGNOSIS — E039 Hypothyroidism, unspecified: Secondary | ICD-10-CM | POA: Insufficient documentation

## 2021-08-22 HISTORY — DX: Anemia, unspecified: D64.9

## 2021-08-22 LAB — CBC WITH DIFFERENTIAL/PLATELET
Abs Immature Granulocytes: 0.01 10*3/uL (ref 0.00–0.07)
Basophils Absolute: 0 10*3/uL (ref 0.0–0.1)
Basophils Relative: 1 %
Eosinophils Absolute: 0.1 10*3/uL (ref 0.0–0.5)
Eosinophils Relative: 1 %
HCT: 22.6 % — ABNORMAL LOW (ref 39.0–52.0)
Hemoglobin: 6.5 g/dL — CL (ref 13.0–17.0)
Immature Granulocytes: 0 %
Lymphocytes Relative: 30 %
Lymphs Abs: 1.6 10*3/uL (ref 0.7–4.0)
MCH: 17.8 pg — ABNORMAL LOW (ref 26.0–34.0)
MCHC: 28.8 g/dL — ABNORMAL LOW (ref 30.0–36.0)
MCV: 61.7 fL — ABNORMAL LOW (ref 80.0–100.0)
Monocytes Absolute: 0.8 10*3/uL (ref 0.1–1.0)
Monocytes Relative: 15 %
Neutro Abs: 2.8 10*3/uL (ref 1.7–7.7)
Neutrophils Relative %: 53 %
Platelets: 363 10*3/uL (ref 150–400)
RBC: 3.66 MIL/uL — ABNORMAL LOW (ref 4.22–5.81)
RDW: 19.3 % — ABNORMAL HIGH (ref 11.5–15.5)
WBC: 5.3 10*3/uL (ref 4.0–10.5)
nRBC: 0 % (ref 0.0–0.2)

## 2021-08-22 LAB — OSMOLALITY, URINE: Osmolality, Ur: 308 mOsm/kg (ref 300–900)

## 2021-08-22 LAB — SODIUM, URINE, RANDOM: Sodium, Ur: 68 mmol/L

## 2021-08-22 LAB — BASIC METABOLIC PANEL
Anion gap: 7 (ref 5–15)
BUN: 14 mg/dL (ref 8–23)
CO2: 25 mmol/L (ref 22–32)
Calcium: 8.8 mg/dL — ABNORMAL LOW (ref 8.9–10.3)
Chloride: 96 mmol/L — ABNORMAL LOW (ref 98–111)
Creatinine, Ser: 1.04 mg/dL (ref 0.61–1.24)
GFR, Estimated: >60 mL/min (ref >60)
Glucose, Bld: 119 mg/dL — ABNORMAL HIGH (ref 70–99)
Potassium: 4.3 mmol/L (ref 3.5–5.1)
Sodium: 128 mmol/L — ABNORMAL LOW (ref 135–145)

## 2021-08-22 LAB — RESP PANEL BY RT-PCR (FLU A&B, COVID) ARPGX2
Influenza A by PCR: NEGATIVE
Influenza B by PCR: NEGATIVE
SARS Coronavirus 2 by RT PCR: NEGATIVE

## 2021-08-22 LAB — ABO/RH: ABO/RH(D): O NEG

## 2021-08-22 LAB — PREPARE RBC (CROSSMATCH)

## 2021-08-22 LAB — OCCULT BLOOD X 1 CARD TO LAB, STOOL: Fecal Occult Bld: NEGATIVE

## 2021-08-22 MED ORDER — METHOCARBAMOL 500 MG PO TABS: 500.0000 mg | ORAL_TABLET | Freq: Four times a day (QID) | ORAL | Status: DC | PRN

## 2021-08-22 MED ORDER — SODIUM CHLORIDE 0.9% IV SOLUTION: Freq: Once | INTRAVENOUS | Status: AC

## 2021-08-22 MED ORDER — MECLIZINE HCL 25 MG PO TABS: 25.0000 mg | ORAL_TABLET | Freq: Two times a day (BID) | ORAL | Status: DC | PRN

## 2021-08-22 MED ORDER — PANTOPRAZOLE SODIUM 40 MG PO TBEC
40.0000 mg | DELAYED_RELEASE_TABLET | Freq: Two times a day (BID) | ORAL | Status: DC
  Administered 2021-08-22 – 2021-08-23 (×2): 40 mg via ORAL
  Filled 2021-08-22 (×2): qty 1

## 2021-08-22 MED ORDER — ONDANSETRON HCL 4 MG PO TABS: 4.0000 mg | ORAL_TABLET | Freq: Four times a day (QID) | ORAL | Status: DC | PRN

## 2021-08-22 MED ORDER — ADULT MULTIVITAMIN W/MINERALS CH
1.0000 | ORAL_TABLET | Freq: Every morning | ORAL | Status: DC
  Administered 2021-08-23: 1 via ORAL
  Filled 2021-08-22: qty 1

## 2021-08-22 MED ORDER — MONTELUKAST SODIUM 10 MG PO TABS
10.0000 mg | ORAL_TABLET | Freq: Every day | ORAL | Status: DC
  Administered 2021-08-22: 10 mg via ORAL
  Filled 2021-08-22: qty 1

## 2021-08-22 MED ORDER — LEVOTHYROXINE SODIUM 25 MCG PO TABS
125.0000 ug | ORAL_TABLET | Freq: Every day | ORAL | Status: DC
  Administered 2021-08-23: 125 ug via ORAL
  Filled 2021-08-22: qty 1

## 2021-08-22 MED ORDER — GABAPENTIN 100 MG PO CAPS
200.0000 mg | ORAL_CAPSULE | Freq: Two times a day (BID) | ORAL | Status: DC
  Administered 2021-08-22 – 2021-08-23 (×2): 200 mg via ORAL
  Filled 2021-08-22 (×2): qty 2

## 2021-08-22 MED ORDER — VITAMIN D 25 MCG (1000 UNIT) PO TABS
2000.0000 [IU] | ORAL_TABLET | Freq: Every morning | ORAL | Status: DC
  Administered 2021-08-23: 2000 [IU] via ORAL
  Filled 2021-08-22: qty 2

## 2021-08-22 MED ORDER — CILIDINIUM-CHLORDIAZEPOXIDE 2.5-5 MG PO CAPS: 1.0000 | ORAL_CAPSULE | Freq: Every day | ORAL | Status: DC | PRN

## 2021-08-22 MED ORDER — SODIUM CHLORIDE 0.9 % IV SOLN
200.0000 mg | Freq: Once | INTRAVENOUS | Status: AC
  Administered 2021-08-22: 200 mg via INTRAVENOUS
  Filled 2021-08-22 (×2): qty 10

## 2021-08-22 MED ORDER — LORATADINE 10 MG PO TABS
10.0000 mg | ORAL_TABLET | Freq: Every evening | ORAL | Status: DC
Start: 2021-08-22 — End: 2021-08-23
  Administered 2021-08-22: 10 mg via ORAL
  Filled 2021-08-22: qty 1

## 2021-08-22 MED ORDER — ONDANSETRON HCL 4 MG/2ML IJ SOLN: 4.0000 mg | Freq: Four times a day (QID) | INTRAMUSCULAR | Status: DC | PRN

## 2021-08-22 MED ORDER — MAGNESIUM OXIDE -MG SUPPLEMENT 400 (240 MG) MG PO TABS
200.0000 mg | ORAL_TABLET | Freq: Every day | ORAL | Status: DC
  Administered 2021-08-22: 200 mg via ORAL
  Filled 2021-08-22: qty 1

## 2021-08-22 NOTE — Plan of Care (Signed)
Patient admitted for primary reason of 6.8 Hgb. Dr. Tyrone Schimke has assessed the patient and has added new orders.

## 2021-08-22 NOTE — H&P (Signed)
History and Physical    PHAN NAZARYAN [id number] DOB: Feb 08, 1941 DOA: 08/22/2021  PCP: Janith Lima, MD  Patient coming from: Home  Chief Complaint: fatigue,   HPI: Gregory [name] is a 80 y.o. male with medical history significant of BPH, hypothyroidism, chronic hyponatremia. Presenting with fatigue, dizziness, dyspnea. He reports he's had these symptoms over the last couple of months. He has been evaluated by cards and pulm. He was given inhalers. He didn't see much improvement, but he says he was generally ok. He was getting pre-op lab work yesterday. It was noted that his Hgb was low. It was recommended that he come to the ED for transfusion of blood. He denies any other aggravating or alleviating factors.     ED Course: Hgb was 6.5. FOBT was negative. His iron levels were low. TRH was called for admission to [facility].   Review of Systems: Denies CP, palpitations, N/V/D, hematemesis, hemoptysis, hematochezia. Review of systems is otherwise negative for all not mentioned in HPI.   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   History of nonmelanoma skin cancer EXCISION SQUAMOUS CELL FROM HAND   Hypertrophy of prostate with urinary obstruction and other lower urinary tract symptoms (LUTS)    Irritable bowel syndrome    Lumbago    Other diseases of nasal cavity and sinuses(478.19)    Persistent disorder of initiating or maintaining sleep    Plantar fascial fibromatosis    Pure hypercholesterolemia    Telangiectasia, hereditary hemorrhagic, of Rendu, 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)   SKIN, LIPS, NASAL W/ PREVIOUS NOSE BLEEDS  AMD GI TELANGIECTASIA   Unspecified hypothyroidism     PSHx Past Surgical History:  Procedure Laterality Date   APPLICATION OF CRANIAL NAVIGATION N/A  05/16/2015   Procedure: APPLICATION OF CRANIAL NAVIGATION;  Surgeon: Consuella Lose, MD;  Location: [facility] NEURO ORS;  Service: Neurosurgery;  Laterality: N/A;   BRAIN BIOPSY Left 05/16/2015   Procedure: Stereotactic Left Brain Biopsy with Brain Lab;  Surgeon: Consuella Lose, MD;  Location: [place] NEURO ORS;  Service: Neurosurgery;  Laterality: Left;  Stereotactic Left Brain biopsy with brainlab   BRAIN SURGERY     CARDIOVASCULAR STRESS TEST  01-14-2003   NO ISCHEMIA / EF 61%/ NORMAL LE WALL MOTION   EXCISION LEFT WRIST GANGLIAN/ MYXOID CYST  05-30-2009   IR RADIOLOGIST EVAL & MGMT  02/01/2019   NASAL SINUS SURGERY     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 Renda Rolls   TRANSTHORACIC ECHOCARDIOGRAM  10-17-2008   DR CRENSHAW   NORMAL LVF/ EF 60%/  MILDLY DILATED RIGHT ATRIUM/ VENTRICULE   VARICOCELECTOMY  1991   Dr. Tresa Endo    SocHx  reports that he has never smoked. He has never used smokeless tobacco. He reports that he does not drink alcohol and does not use drugs.  Allergies  Allergen Reactions   Nsaids Other (See Comments)    Cannot take-per MD   Other Other (See Comments)    All blood thinners-cannot take per MD   Aspirin Other (See Comments)    nosebleeds  Statins Other (See Comments)    Weakness, myalgias   Sulfa Antibiotics    Tizanidine Hcl Other (See Comments)    Patient stated that after taking this medication he experienced feelings of dizziness and disorientation.     FamHx Family History  Problem Relation Age of Onset   Diabetes Mother    Hypertension Mother    Pancreatic cancer Mother    Stroke Mother    Kidney failure Father    Hypertension Sister    Healthy Son     Prior to Admission medications   Medication Sig Start Date End Date Taking? Authorizing Provider  acetaminophen (TYLENOL) 325 MG  tablet Take 650 mg by mouth every 6 (six) hours as needed for mild pain.    [provider]  amoxicillin (AMOXIL) 500 MG capsule SMARTSIG:4 Capsule(s) By Mouth PRN 10/23/20   Janith Lima, MD  Cholecalciferol (VITAMIN D3) 2000 UNITS TABS Take 5,000 Units by mouth daily.     [provider]  clidinium-chlordiazePOXIDE (LIBRAX) 5-2.5 MG capsule Take 1 capsule by mouth 3 (three) times daily before meals. Patient taking differently: Take 1 capsule by mouth 3 (three) times daily before meals. AS NEEDED 3 TIMES DAILY 10/23/20   Janith Lima, MD  clobetasol ointment (TEMOVATE) 0.05 % Apply topically 2 (two) times daily. 05/12/20   [provider]  COVID-19 mRNA Vac-TriS, Pfizer, (PFIZER-BIONT COVID-19 VAC-TRIS) SUSP injection Inject into the muscle. 06/08/21   Carlyle Basques, MD  Dextromethorphan-guaiFENesin (ROBITUSSIN DM PO) Take by mouth. Taking cough gel as needed    [provider]  gabapentin (NEURONTIN) 100 MG capsule Take 1 capsule (100 mg total) by mouth 2 (two) times daily. 06/22/21   Janith Lima, MD  levocetirizine (XYZAL) 5 MG tablet Take 1 tablet (5 mg total) by mouth every evening. 06/27/18   Janith Lima, MD  levothyroxine (SYNTHROID) 125 MCG tablet Take 1 tablet (125 mcg total) by mouth daily before breakfast. 04/23/21   Janith Lima, MD  Magnesium 250 MG TABS Take 250 mg by mouth at bedtime.     [provider]  meclizine (ANTIVERT) 25 MG tablet Take 25 mg by mouth 2 (two) times daily as needed for dizziness.    [provider]  methocarbamol (ROBAXIN) 500 MG tablet Take 500 mg by mouth every 6 (six) hours as needed. 07/29/20   [provider]  mometasone-formoterol (DULERA) 200-5 MCG/ACT AERO Inhale 2 puffs into the lungs in the morning and at bedtime. 04/23/21   Hunsucker, Bonna Gains, MD  montelukast (SINGULAIR) 10 MG tablet TAKE 1 TABLET BY MOUTH AT  BEDTIME 03/06/21   Janith Lima, MD  Multiple Vitamins-Minerals [facility]  FOR HIM 50+) TABS Take 1 tablet by mouth daily.    [provider]  pantoprazole (PROTONIX) 40 MG tablet Take 1 tablet (40 mg total) by mouth 2 (two) times daily. 04/23/21   Janith Lima, MD  silodosin (RAPAFLO) 8 MG CAPS capsule Take 1 capsule (8 mg total) by mouth at bedtime. TAKE ONE CAPSULE BY MOUTH DAILY AT BEDTIME 04/23/21   Janith Lima, MD    Physical Exam: Vitals:   08/22/21 1030 08/22/21 1200 08/22/21 1230 08/22/21 1425  BP: (!) 127/52 (!) 140/54 (!) 137/58 (!) 153/62  Pulse: 68 68 66 77  Resp: '19 13 15 18  '$ Temp:    (!) 97.5 F (36.4 C)  TempSrc:    Oral  SpO2: 96% 98% 95% 97%  Weight:  Height:        General: 80 y.o. male resting in bed in NAD Eyes: PERRL, normal sclera ENMT: Nares patent w/o discharge, orophaynx clear, dentition normal, ears w/o discharge/lesions/ulcers; mucosa are pale Neck: Supple, trachea midline Cardiovascular: RRR, +S1, S2, no m/g/r, equal pulses throughout Respiratory: CTABL, no w/r/r, normal WOB GI: BS+, NDNT, no masses noted, no organomegaly noted MSK: No e/c/c Skin: No rashes, bruises, ulcerations noted Neuro: A&O x 3, no focal deficits Psyc: Appropriate interaction and affect, calm/cooperative  Labs on Admission: I have personally reviewed following labs and imaging studies  CBC: Recent Labs  Lab 08/20/21 1402 08/22/21 0943  WBC 7.1 5.3  NEUTROABS  --  2.8  HGB 6.6 Repeated and verified X2.* 6.5*  HCT 21.8 Repeated and verified X2.* 22.6*  MCV 59.0* 61.7*  PLT 377.0 [id number]   Basic Metabolic Panel: Recent Labs  Lab 08/20/21 1402 08/22/21 0943  NA 122* 128*  K 4.5 4.3  CL 90* 96*  CO2 26 25  GLUCOSE 94 119*  BUN 12 14  CREATININE 1.02 1.04  CALCIUM 9.0 8.8*   GFR: Estimated Creatinine Clearance: 60.4 mL/min (by C-G formula based on SCr of 1.04 mg/dL). Liver Function Tests: Recent Labs  Lab 08/20/21 1402  AST 14  ALT 9  ALKPHOS 51  BILITOT 0.4  PROT 6.7  ALBUMIN 4.3   No results for input(s):  LIPASE, AMYLASE in the last 168 hours. No results for input(s): AMMONIA in the last 168 hours. Coagulation Profile: No results for input(s): INR, PROTIME in the last 168 hours. Cardiac Enzymes: No results for input(s): CKTOTAL, CKMB, CKMBINDEX, TROPONINI in the last 168 hours. BNP (last 3 results) Recent Labs    03/19/21 0959  PROBNP 126.0*   HbA1C: No results for input(s): HGBA1C in the last 72 hours. CBG: No results for input(s): GLUCAP in the last 168 hours. Lipid Profile: No results for input(s): CHOL, HDL, LDLCALC, TRIG, CHOLHDL, LDLDIRECT in the last 72 hours. Thyroid Function Tests: No results for input(s): TSH, T4TOTAL, FREET4, T3FREE, THYROIDAB in the last 72 hours. Anemia Panel: Recent Labs    08/20/21 1402  FERRITIN 3.5*  TIBC 399  IRON 9*   Urine analysis:    Component Value Date/Time   COLORURINE YELLOW 04/24/2019 0831   APPEARANCEUR CLEAR 04/24/2019 0831   LABSPEC 1.020 04/24/2019 0831   PHURINE 7.0 04/24/2019 0831   GLUCOSEU NEGATIVE 04/24/2019 0831   HGBUR NEGATIVE 04/24/2019 0831   BILIRUBINUR NEGATIVE 04/24/2019 0831   KETONESUR NEGATIVE 04/24/2019 0831   PROTEINUR NEGATIVE 09/16/2008 1509   UROBILINOGEN 0.2 04/24/2019 0831   NITRITE NEGATIVE 04/24/2019 0831   LEUKOCYTESUR NEGATIVE 04/24/2019 0831    Radiological Exams on Admission: No results found.  EKG: None obtained in the ED.   Assessment/Plan Symptomatic anemia     - place in tele, obs     - 2 units pRBCs ordered, check follow up H&H     - no GIB noted, FOBT is negative     - iron is low, will also give a dose of venofer  BPH     - continue home meds as tolerated     - to have TURP next week  Chronic hyponatremia     - baseline appears to be 128-131; he's 128 today     - check urine studies, monitor  Chronic cough     - continue home regimen  Hypothyroidism     - continue home levothyroxine  GERD     -  continue protonix  DVT prophylaxis: SCDs  Code Status: FULL   Family Communication: w/ wife at bedside  Consults called: None   Status is: Observation  The patient remains OBS appropriate and will d/c before 2 midnights.  Dispo: The patient is from: Home              Anticipated d/c is to: Home              Patient currently is not medically stable to d/c.   Difficult to place patient No  Time spent coordinating admission: 60 minutes  [facility]  If 7PM-7AM, please contact night-coverage [email or web address]  08/22/2021, 2:46 PM

## 2021-08-22 NOTE — ED Notes (Signed)
I gave report to [place], RN on [address] at [facility]. Pt. Ambulates to his vehicle at this time. Per Dr. Tamera Punt, [place] for pt. To go to [facility] by P.O.V.

## 2021-08-22 NOTE — ED Notes (Signed)
He adamantly refuses COVID testing "My left nare is closed with a skin graft; and if anyone puts anything into my right nare it causes severe bleeding".

## 2021-08-22 NOTE — ED Triage Notes (Signed)
Pt went for pre op on Tuesday was told he needs a blood transfusion before surgery. Pt present here due to called yesterday, was instructed to come get transfusion for surgery Sept 13.

## 2021-08-22 NOTE — Progress Notes (Signed)
Notified by EDP of need for admission d/t symptomatic anemia. [facility] accepts patient to tele bed at [facility]. EDP is to remain responsible for orders/medical decisions while patient is holding at [facility]. Upon arrival to [facility], [facility] will assume care. Nursing staff will call patient placement to notify them of patient's arrival so that the proper TRH member may receive the patient. Thank you.

## 2021-08-22 NOTE — ED Provider Notes (Addendum)
[facility] EMERGENCY DEPT Provider Note   CSN: [id number] Arrival date & time: 08/22/21  [id number]     History No chief complaint on file.   Gregory [name] is a 80 y.o. male.  Patient is a 80 year old male with a history of hypertension, hyperlipidemia, telangiectasia, prior endocarditis who presents with anemia.  He is scheduled to have a TURP done later this month.  When he had preop blood work done, he was found to have significant anemia with a hemoglobin of 6.6.  He also was noted to have hyponatremia.  He was advised by his primary care doctor to present here for a blood transfusion as they were unable to get him into the infusion center over the weekend.  He does report that he has had some progressive shortness of breath and dizziness over the last few months.  He has been seen by cardiology and his pulmonologist.  He has not reported any noted blood in his stool or melena.  He has had prior anemia in the past and has required blood transfusions but attributes this to heavy nosebleeds in the past.  He does not have any known history of GI bleeds.  He has felt generally fatigued over the last couple of weeks.  No chest pain.  No nausea or vomiting.      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   History of nonmelanoma skin cancer EXCISION SQUAMOUS CELL FROM HAND   Hypertrophy of prostate with urinary obstruction and other lower urinary tract symptoms (LUTS)    Irritable bowel syndrome    Lumbago    Other diseases of nasal cavity and sinuses(478.19)    Persistent disorder of initiating or maintaining sleep    Plantar fascial fibromatosis    Pure hypercholesterolemia    Telangiectasia, hereditary hemorrhagic, of Rendu, 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)   SKIN, LIPS,  NASAL W/ PREVIOUS NOSE BLEEDS  AMD GI TELANGIECTASIA   Unspecified hypothyroidism     Patient Active Problem List   Diagnosis Date Noted   Symptomatic anemia 08/22/2021   Nonrheumatic mitral valve regurgitation 03/19/2021   Bilateral leg edema 03/19/2021   SBE (subacute bacterial endocarditis) prophylaxis candidate 10/23/2020   Atherosclerosis of aorta (HCC) 10/23/2020   Hyperglycemia 10/23/2020   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/23/2020   Chronic hyponatremia 10/24/2019   Cough, persistent 10/24/2019   Laryngopharyngeal reflux (LPR) 01/29/2019   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   Seasonal allergic rhinitis due to pollen 06/27/2018   Peripheral neuropathy 01/01/2016   Atrial flutter ([place]) 08/07/2015   Encounter for therapeutic drug monitoring    Osteoarthritis 04/01/2015   Iron deficiency anemia due to chronic blood loss 10/08/2014   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 [id number]   HYPERCHOLESTEROLEMIA, MILD 07/25/2008   CARCINOMA, SKIN, SQUAMOUS CELL 02/10/2008   INSOMNIA, CHRONIC 02/10/2008   GERD 02/10/2008   Benign prostatic hyperplasia with urinary obstruction 02/10/2008   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   Hypothyroidism 02/08/2008   Irritable bowel syndrome 02/08/2008   BACK PAIN, LUMBAR 02/08/2008    Past Surgical History:  Procedure Laterality Date   APPLICATION OF CRANIAL NAVIGATION N/A 05/16/2015   Procedure: APPLICATION OF CRANIAL NAVIGATION;  Surgeon: Consuella Lose, MD;  Location:  [place] NEURO ORS;  Service: Neurosurgery;  Laterality: N/A;   BRAIN BIOPSY Left 05/16/2015   Procedure: Stereotactic Left Brain Biopsy with Brain Lab;  Surgeon: Consuella Lose, MD;  Location: [place] NEURO ORS;  Service: Neurosurgery;  Laterality: Left;  Stereotactic Left Brain biopsy with brainlab   BRAIN SURGERY     CARDIOVASCULAR STRESS TEST  01-14-2003   NO  ISCHEMIA / EF 61%/ NORMAL LE WALL MOTION   EXCISION LEFT WRIST GANGLIAN/ MYXOID CYST  05-30-2009   IR RADIOLOGIST EVAL & MGMT  02/01/2019   NASAL SINUS SURGERY     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 Renda Rolls   TRANSTHORACIC ECHOCARDIOGRAM  10-17-2008   DR CRENSHAW   NORMAL LVF/ EF 60%/  MILDLY DILATED RIGHT ATRIUM/ VENTRICULE   VARICOCELECTOMY  1991   Dr. Tresa Endo       Family History  Problem Relation Age of Onset   Diabetes Mother    Hypertension Mother    Pancreatic cancer Mother    Stroke Mother    Kidney failure Father    Hypertension Sister    Healthy Son     Social History   Tobacco Use   Smoking status: Never   Smokeless tobacco: Never  Vaping Use   Vaping Use: Never used  Substance Use Topics   Alcohol use: No    Alcohol/week: 0.0 standard drinks   Drug use: No    Home Medications Prior to Admission medications   Medication Sig Start Date End Date Taking? Authorizing Provider  acetaminophen (TYLENOL) 325 MG tablet Take 650 mg by mouth every 6 (six) hours as needed for mild pain.    [provider]  amoxicillin (AMOXIL) 500 MG capsule SMARTSIG:4 Capsule(s) By Mouth PRN 10/23/20   Janith Lima, MD  Cholecalciferol (VITAMIN D3) 2000 UNITS TABS Take 5,000 Units by mouth daily.     [provider]  clidinium-chlordiazePOXIDE (LIBRAX) 5-2.5 MG capsule Take 1 capsule by mouth 3 (three) times daily before meals. Patient taking differently: Take 1 capsule by mouth 3 (three) times daily before meals. AS NEEDED 3 TIMES DAILY 10/23/20   Janith Lima, MD  clobetasol ointment (TEMOVATE) 0.05 % Apply topically 2 (two) times daily. 05/12/20   [provider]  COVID-19 mRNA Vac-TriS, Pfizer, (PFIZER-BIONT COVID-19 VAC-TRIS) SUSP injection Inject into the muscle. 06/08/21   Carlyle Basques, MD  Dextromethorphan-guaiFENesin (ROBITUSSIN DM PO) Take by mouth. Taking cough gel as needed    [provider]  gabapentin (NEURONTIN) 100 MG capsule Take 1 capsule (100 mg total) by mouth 2 (two) times daily. 06/22/21   Janith Lima, MD  levocetirizine (XYZAL) 5 MG tablet Take 1 tablet (5 mg total) by mouth every evening. 06/27/18   Janith Lima, MD  levothyroxine (SYNTHROID) 125 MCG tablet Take 1 tablet (125 mcg total) by mouth daily before breakfast. 04/23/21   Janith Lima, MD  Magnesium 250 MG TABS Take 250 mg by mouth at bedtime.     [provider]  meclizine (ANTIVERT) 25 MG tablet Take 25 mg by mouth 2 (two) times daily as needed for dizziness.    [provider]  methocarbamol (ROBAXIN) 500 MG tablet Take 500 mg by mouth every 6 (six) hours as needed. 07/29/20   [provider]  mometasone-formoterol Ruthe Mannan)  200-5 MCG/ACT AERO Inhale 2 puffs into the lungs in the morning and at bedtime. 04/23/21   Hunsucker, Bonna Gains, MD  montelukast (SINGULAIR) 10 MG tablet TAKE 1 TABLET BY MOUTH AT  BEDTIME 03/06/21   Janith Lima, MD  Multiple Vitamins-Minerals [facility] FOR HIM 50+) TABS Take 1 tablet by mouth daily.    [provider]  pantoprazole (PROTONIX) 40 MG tablet Take 1 tablet (40 mg total) by mouth 2 (two) times daily. 04/23/21   Janith Lima, MD  silodosin (RAPAFLO) 8 MG CAPS capsule Take 1 capsule (8 mg total) by mouth at bedtime. TAKE ONE CAPSULE BY MOUTH DAILY AT BEDTIME 04/23/21   Janith Lima, MD    Allergies    Nsaids, Other, Aspirin, Statins, Sulfa antibiotics, and Tizanidine hcl  Review of Systems   Review of Systems  Constitutional:  Positive for fatigue. Negative for chills, diaphoresis and fever.  HENT:  Negative for congestion, rhinorrhea and sneezing.   Eyes: Negative.   Respiratory:  Positive for shortness of breath. Negative for cough and chest tightness.   Cardiovascular:  Negative for chest pain and leg  swelling.  Gastrointestinal:  Negative for abdominal pain, blood in stool, diarrhea, nausea and vomiting.  Genitourinary:  Negative for difficulty urinating, flank pain, frequency and hematuria.  Musculoskeletal:  Negative for arthralgias and back pain.  Skin:  Negative for rash.  Neurological:  Positive for dizziness and weakness (Generalized). Negative for speech difficulty, numbness and headaches.   Physical Exam Updated Vital Signs BP (!) 127/52   Pulse 68   Temp 98.5 F (36.9 C) (Oral)   Resp 19   Ht '5\' 10"'$  (1.778 m)   Wt 82.6 kg   SpO2 96%   BMI 26.11 kg/m   Physical Exam Constitutional:      Appearance: He is well-developed.  HENT:     Head: Normocephalic and atraumatic.  Eyes:     Pupils: Pupils are equal, round, and reactive to light.  Cardiovascular:     Rate and Rhythm: Normal rate and regular rhythm.     Heart sounds: Murmur heard.  Pulmonary:     Effort: Pulmonary effort is normal. No respiratory distress.     Breath sounds: Normal breath sounds. No wheezing or rales.  Chest:     Chest wall: No tenderness.  Abdominal:     General: Bowel sounds are normal.     Palpations: Abdomen is soft.     Tenderness: There is no abdominal tenderness. There is no guarding or rebound.  Genitourinary:    Comments: Brown stool on rectal exam, Hemoccult negative Musculoskeletal:        General: Normal range of motion.     Cervical back: Normal range of motion and neck supple.  Lymphadenopathy:     Cervical: No cervical adenopathy.  Skin:    General: Skin is warm and dry.     Findings: No rash.  Neurological:     Mental Status: He is alert and oriented to person, place, and time.    ED Results / Procedures / Treatments   Labs (all labs ordered are listed, but only abnormal results are displayed) Labs Reviewed  BASIC METABOLIC PANEL - Abnormal; Notable for the following components:      Result Value   Sodium 128 (*)    Chloride 96 (*)    Glucose, Bld 119 (*)     Calcium 8.8 (*)    All other components within normal limits  CBC WITH DIFFERENTIAL/PLATELET -  Abnormal; Notable for the following components:   RBC 3.66 (*)    Hemoglobin 6.5 (*)    HCT 22.6 (*)    MCV 61.7 (*)    MCH 17.8 (*)    MCHC 28.8 (*)    RDW 19.3 (*)    All other components within normal limits  RESP PANEL BY RT-PCR (FLU A&B, COVID) [id number]  OCCULT BLOOD X 1 CARD TO LAB, STOOL    EKG None  Radiology No results found.  Procedures Procedures   Medications Ordered in ED Medications - No data to display  ED Course  I have reviewed the triage vital signs and the nursing notes.  Pertinent labs & imaging results that were available during my care of the patient were reviewed by me and considered in my medical decision making (see chart for details).    MDM Rules/Calculators/A&P                           Patient is a 80 year old male who presents with shortness of breath and dizziness.  He is noted to have a low hemoglobin at 6.5.  His sodium which had been lower in his outpatient blood work has improved to 128.  His vital signs are stable.  I spoke with Dr. Marylyn Ishihara who will admit the patient for blood transfusion.  We are unable to do a blood transfusion at this facility.  Of note, patient is refusing a COVID test given that he has had a prior skin graft in his left nare and has significant bleeding anytime he has had swabs in his right nare.  CRITICAL CARE Performed by: Malvin Johns Total critical care time: 60 minutes Critical care time was exclusive of separately billable procedures and treating other patients. Critical care was necessary to treat or prevent imminent or life-threatening deterioration. Critical care was time spent personally by me on the following activities: development of treatment plan with patient and/or surrogate as well as nursing, discussions with consultants, evaluation of patient's response to treatment, examination of patient, obtaining history  from patient or surrogate, ordering and performing treatments and interventions, ordering and review of laboratory studies, ordering and review of radiographic studies, pulse oximetry and re-evaluation of patient's condition.  Final Clinical Impression(s) / ED Diagnoses Final diagnoses:  Anemia, unspecified type  Hyponatremia    Rx / DC Orders ED Discharge Orders     None        Malvin Johns, MD 08/22/21 1111    Malvin Johns, MD 08/22/21 1111

## 2021-08-23 DIAGNOSIS — D649 Anemia, unspecified: Secondary | ICD-10-CM | POA: Diagnosis not present

## 2021-08-23 LAB — CBC
HCT: 27.4 % — ABNORMAL LOW (ref 39.0–52.0)
Hemoglobin: 8.3 g/dL — ABNORMAL LOW (ref 13.0–17.0)
MCH: 20 pg — ABNORMAL LOW (ref 26.0–34.0)
MCHC: 30.3 g/dL (ref 30.0–36.0)
MCV: 65.9 fL — ABNORMAL LOW (ref 80.0–100.0)
Platelets: 366 10*3/uL (ref 150–400)
RBC: 4.16 MIL/uL — ABNORMAL LOW (ref 4.22–5.81)
RDW: 23.2 % — ABNORMAL HIGH (ref 11.5–15.5)
WBC: 8 10*3/uL (ref 4.0–10.5)
nRBC: 0 % (ref 0.0–0.2)

## 2021-08-23 LAB — COMPREHENSIVE METABOLIC PANEL
ALT: 13 U/L (ref 0–44)
AST: 16 U/L (ref 15–41)
Albumin: 4 g/dL (ref 3.5–5.0)
Alkaline Phosphatase: 43 U/L (ref 38–126)
Anion gap: 7 (ref 5–15)
BUN: 13 mg/dL (ref 8–23)
CO2: 24 mmol/L (ref 22–32)
Calcium: 8.9 mg/dL (ref 8.9–10.3)
Chloride: 101 mmol/L (ref 98–111)
Creatinine, Ser: 0.86 mg/dL (ref 0.61–1.24)
GFR, Estimated: >60 mL/min (ref >60)
Glucose, Bld: 97 mg/dL (ref 70–99)
Potassium: 4.4 mmol/L (ref 3.5–5.1)
Sodium: 132 mmol/L — ABNORMAL LOW (ref 135–145)
Total Bilirubin: 2 mg/dL — ABNORMAL HIGH (ref 0.3–1.2)
Total Protein: 6.5 g/dL (ref 6.5–8.1)

## 2021-08-23 MED ORDER — IRON (FERROUS SULFATE) 325 (65 FE) MG PO TABS: 325.0000 mg | ORAL_TABLET | Freq: Three times a day (TID) | ORAL | 2 refills | Status: AC

## 2021-08-23 MED ORDER — GUAIFENESIN-DM 100-10 MG/5ML PO SYRP
5.0000 mL | ORAL_SOLUTION | ORAL | Status: DC | PRN
  Administered 2021-08-23: 5 mL via ORAL
  Filled 2021-08-23: qty 10

## 2021-08-23 MED ORDER — ACETAMINOPHEN 325 MG PO TABS
650.0000 mg | ORAL_TABLET | Freq: Four times a day (QID) | ORAL | Status: DC | PRN
  Administered 2021-08-23: 650 mg via ORAL
  Filled 2021-08-23: qty 2

## 2021-08-23 MED ORDER — BENZONATATE 100 MG PO CAPS
200.0000 mg | ORAL_CAPSULE | Freq: Two times a day (BID) | ORAL | Status: DC | PRN
  Administered 2021-08-23: 200 mg via ORAL
  Filled 2021-08-23: qty 2

## 2021-08-23 NOTE — Discharge Summary (Signed)
Physician Discharge Summary  Gregory [name] [id number] DOB: [birth year] DOA: 08/22/2021  PCP: Janith Lima, MD  Admit date: 08/22/2021 Discharge date: 08/23/2021  Admitted From: Home Disposition: Home  Recommendations for Outpatient Follow-up:  Follow up with PCP in 1 week. Please obtain BMP/CBC in one week May need blood transfusion along with scheduled surgery next week.  Home Health: Not applicable Equipment/Devices: Not applicable  Discharge Condition: Stable CODE STATUS: Full code Diet recommendation: Regular diet  Discharge summary:  80 year old gentleman with history of BPH who is a scheduled for TURP, hypothyroidism and chronic hyponatremia presented with fatigue and dizziness for last few months.  Scheduled for TURP on 9/13, preop lab showed hemoglobin 6.5.  Sent to ER for blood transfusion, admitted to observation.  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 Hemoglobin is gradually dropping since last 1 year from 14-10-9-6.5 over last 1 year with Colonoscopy 06/2021 with benign polyp but no bleeding. 2 units of PRBC transfusion, 1 unit of IV iron transfusion given, severe iron deficiency.  Plan: Likely bleeding from GI tract, chronic intermittent suspected.  Hemoglobin adequate today. Patient has gastroenterologist at [facility], will need upper GI endoscopy and capsule endoscopy. He is not actively bleeding now, FOBT is negative and would less likely benefit with any inpatient endoscopies. May need blood transfusion along with urological procedure. Patient will talk to his gastroenterologist to schedule for scopes. Stable for discharge. Is not on any anticoagulation or NSAIDs. Sodium is chronically low and at baseline.  Level is 132 today. Will start on oral iron therapy.   Discharge Diagnoses:  Active Problems:   Symptomatic anemia    Discharge  Instructions  Discharge Instructions     Call MD for:  extreme fatigue   Complete by: As directed    Call MD for:  persistant dizziness or light-headedness   Complete by: As directed    Diet - low sodium heart healthy   Complete by: As directed    Discharge instructions   Complete by: As directed    Call your GI doctor to schedule an appointment   Increase activity slowly   Complete by: As directed       Allergies as of 08/23/2021       Reactions   Nsaids Other (See Comments)   Cannot take-per MD   Other Other (See Comments)   All blood thinners-cannot take per MD   Aspirin Other (See Comments)   nosebleeds   Statins Other (See Comments)   Weakness, myalgias   Sulfa Antibiotics Other (See Comments)   Unknown reaction   Tizanidine Hcl Other (See Comments)   Patient stated that after taking this medication he experienced feelings of dizziness and disorientation.         Medication List     STOP taking these medications    mometasone-formoterol 200-5 MCG/ACT Aero Commonly known as: DULERA       TAKE these medications    acetaminophen 650 MG CR tablet Commonly known as: TYLENOL Take 1,300 mg by mouth 2 (two) times daily.   amoxicillin 500 MG capsule Commonly known as: AMOXIL SMARTSIG:4 Capsule(s) By Mouth PRN What changed:  how much to take how to take this when to take this additional instructions   clidinium-chlordiazePOXIDE 5-2.5 MG capsule Commonly known as: LIBRAX Take 1 capsule by mouth 3 (three) times daily before meals. What changed:  when to take this reasons to take this  gabapentin 100 MG capsule Commonly known as: NEURONTIN Take 1 capsule (100 mg total) by mouth 2 (two) times daily. What changed: how much to take   Iron (Ferrous Sulfate) 325 (65 Fe) MG Tabs Take 325 mg by mouth in the morning, at noon, and at bedtime.   levocetirizine 5 MG tablet Commonly known as: XYZAL Take 1 tablet (5 mg total) by mouth every evening.    levothyroxine 125 MCG tablet Commonly known as: Synthroid Take 1 tablet (125 mcg total) by mouth daily before breakfast.   Magnesium 250 MG Tabs Take 250 mg by mouth at bedtime.   meclizine 25 MG tablet Commonly known as: ANTIVERT Take 25 mg by mouth 2 (two) times daily as needed for dizziness.   methocarbamol 500 MG tablet Commonly known as: ROBAXIN Take 500 mg by mouth every 6 (six) hours as needed for muscle spasms.   montelukast 10 MG tablet Commonly known as: SINGULAIR TAKE 1 TABLET BY MOUTH AT  BEDTIME   multivitamin with minerals Tabs tablet Take 1 tablet by mouth every morning.   pantoprazole 40 MG tablet Commonly known as: PROTONIX Take 1 tablet (40 mg total) by mouth 2 (two) times daily.   ROBITUSSIN DM PO Take 1 capsule by mouth 2 (two) times daily. Chronic cough   Saw Palmetto Caps Take 1 capsule by mouth at bedtime.   silodosin 8 MG Caps capsule Commonly known as: RAPAFLO Take 1 capsule (8 mg total) by mouth at bedtime. TAKE ONE CAPSULE BY MOUTH DAILY AT BEDTIME What changed: additional instructions   Vitamin D3 50 MCG (2000 UT) Tabs Take 2,000 Units by mouth every morning.        Allergies  Allergen Reactions   Nsaids Other (See Comments)    Cannot take-per MD   Other Other (See Comments)    All blood thinners-cannot take per MD   Aspirin Other (See Comments)    nosebleeds   Statins Other (See Comments)    Weakness, myalgias   Sulfa Antibiotics Other (See Comments)    Unknown reaction   Tizanidine Hcl Other (See Comments)    Patient stated that after taking this medication he experienced feelings of dizziness and disorientation.     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 EXAM: CT CHEST WITH CONTRAST TECHNIQUE: Multidetector CT imaging of the chest was performed during intravenous  contrast administration. CONTRAST:  9m ISOVUE-300 IOPAMIDOL (ISOVUE-300) INJECTION 61% COMPARISON:  Comparison made with December 09, 2020. FINDINGS: Cardiovascular: Calcified atheromatous plaque in the thoracic aorta. This is mild. Normal heart size without substantial pericardial effusion. Normal caliber of the thoracic aorta. Central pulmonary vasculature is normal caliber centrally with signs of large RIGHT lower lobe pulmonary AVM measuring 3.6 x 2.2 x 5.3 cm, stable when compared to the exam of December of 2021. Mediastinum/Nodes: Mildly patulous esophagus. No adenopathy in the mediastinum, bilateral hila or thoracic inlet. No axillary lymphadenopathy. Lungs/Pleura: Mild ground-glass and basilar atelectasis at the lung bases shows a similar appearance to prior imaging. No new or suspicious pulmonary finding. Mild subpleural reticulation and biapical scarring is stable. Airways are patent. Upper Abdomen: Imaged portions the liver, gallbladder, pancreas, spleen, adrenal glands and kidneys without acute process. Visualized gastrointestinal tract without acute process. No upper abdominal lymphadenopathy. Musculoskeletal: No chest wall abnormality. Spinal degenerative changes. No acute or destructive bone finding. 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 No new or significant findings. Stable findings of subpleural reticulation and potential mild interstitial disease as before. Aortic atherosclerosis. Aortic Atherosclerosis (ICD10-I70.0). Electronically Signed   By: Zetta Bills M.D.   On: 08/17/2021 15:07   (Echo, Carotid, EGD, Colonoscopy, ERCP)    Subjective: Patient seen and examined.  No overnight events.  Received 2 units of PRBC and 1 unit of IV iron overnight.  Denies any problems.   Discharge Exam: Vitals:   08/23/21 0210 08/23/21 0604  BP: 135/70 115/63  Pulse: 78 62  Resp: 19 18  Temp: 97.9 F (36.6 C) 98 F (36.7 C)  SpO2: 94% 92%   Vitals:   08/23/21 0001  08/23/21 0016 08/23/21 0210 08/23/21 0604  BP: (!) 123/55 131/60 135/70 115/63  Pulse: 64 64 78 62  Resp: '17 17 19 18  '$ Temp: 97.8 F (36.6 C) 97.7 F (36.5 C) 97.9 F (36.6 C) 98 F (36.7 C)  TempSrc: Oral Oral Oral Oral  SpO2: 93% 92% 94% 92%  Weight:      Height:        General: Pt is alert, awake, not in acute distress On room air.  He has telangiectasia of the skin and also on the tongue. Cardiovascular: RRR, S1/S2 +, no rubs, no gallops Respiratory: CTA bilaterally, no wheezing, no rhonchi Abdominal: Soft, NT, ND, bowel sounds + Extremities: no edema, no cyanosis    The results of significant diagnostics from this hospitalization (including imaging, microbiology, ancillary and laboratory) are listed below for reference.     Microbiology: Recent Results (from the past 240 hour(s))  Resp Panel by RT-PCR (Flu A&B, Covid) Nasopharyngeal Swab     Status: None   Collection Time: 08/22/21  3:20 PM   Specimen: Nasopharyngeal Swab; Nasopharyngeal(NP) swabs in vial transport medium  Result Value Ref Range Status   SARS Coronavirus 2 by RT PCR NEGATIVE NEGATIVE Final    Comment: (NOTE) SARS-CoV-2 target nucleic acids are NOT DETECTED.  The SARS-CoV-2 RNA is generally detectable in upper respiratory specimens during the acute phase of infection. The lowest concentration of SARS-CoV-2 viral copies this assay can detect is 138 copies/mL. A negative result does not preclude SARS-Cov-2 infection and should not be used as the sole basis for treatment or other patient management decisions. A negative result may occur with  improper specimen collection/handling, submission of specimen other than nasopharyngeal swab, presence of viral mutation(s) within the areas targeted by this assay, and inadequate number of viral copies(<138 copies/mL). A negative result must be combined with clinical observations, patient history, and epidemiological information. The expected result is  Negative.  Fact Sheet for Patients:  EntrepreneurPulse.com.au  Fact Sheet for Healthcare Providers:  IncredibleEmployment.be  This test is no t yet approved or cleared by the [place] FDA and  has been authorized for detection and/or diagnosis of SARS-CoV-2 by FDA under an Emergency Use Authorization (EUA). This EUA will remain  in effect (meaning this test can be used) for the duration of the COVID-19 declaration under Section 564(b)(1) of the Act, 21 U.S.C.section 360bbb-3(b)(1), unless the authorization is terminated  or revoked sooner.       Influenza A by PCR NEGATIVE NEGATIVE Final   Influenza B by PCR NEGATIVE NEGATIVE Final    Comment: (NOTE) The Xpert Xpress SARS-CoV-2/FLU/RSV plus assay is intended as an aid in the diagnosis of influenza from Nasopharyngeal swab specimens and should not be used as a sole basis for treatment. Nasal washings and aspirates are  unacceptable for Xpert Xpress SARS-CoV-2/FLU/RSV testing.  Fact Sheet for Patients: EntrepreneurPulse.com.au  Fact Sheet for Healthcare Providers: IncredibleEmployment.be  This test is not yet approved or cleared by the [place] FDA and has been authorized for detection and/or diagnosis of SARS-CoV-2 by FDA under an Emergency Use Authorization (EUA). This EUA will remain in effect (meaning this test can be used) for the duration of the COVID-19 declaration under Section 564(b)(1) of the Act, 21 U.S.C. section 360bbb-3(b)(1), unless the authorization is terminated or revoked.  Performed at [facility], [place] [address], [place], [place] [address]      Labs: BNP (last 3 results) Recent Labs    12/07/20 1439  BNP [id number]   Basic Metabolic Panel: Recent Labs  Lab 08/20/21 1402 08/22/21 0943 08/23/21 0418  NA 122* 128* 132*  K 4.5 4.3 4.4  CL 90* 96* 101  CO2 '26 25 24  '$ GLUCOSE 94 119* 97  BUN '12 14 13   '$ CREATININE 1.02 1.04 0.86  CALCIUM 9.0 8.8* 8.9   Liver Function Tests: Recent Labs  Lab 08/20/21 1402 08/23/21 0418  AST 14 16  ALT 9 13  ALKPHOS 51 43  BILITOT 0.4 2.0*  PROT 6.7 6.5  ALBUMIN 4.3 4.0   No results for input(s): LIPASE, AMYLASE in the last 168 hours. No results for input(s): AMMONIA in the last 168 hours. CBC: Recent Labs  Lab 08/20/21 1402 08/22/21 0943 08/23/21 0418  WBC 7.1 5.3 8.0  NEUTROABS  --  2.8  --   HGB 6.6 Repeated and verified X2.* 6.5* 8.3*  HCT 21.8 Repeated and verified X2.* 22.6* 27.4*  MCV 59.0* 61.7* 65.9*  PLT 377.0 363 366   Cardiac Enzymes: No results for input(s): CKTOTAL, CKMB, CKMBINDEX, TROPONINI in the last 168 hours. BNP: Invalid input(s): POCBNP CBG: No results for input(s): GLUCAP in the last 168 hours. D-Dimer No results for input(s): DDIMER in the last 72 hours. Hgb A1c No results for input(s): HGBA1C in the last 72 hours. Lipid Profile No results for input(s): CHOL, HDL, LDLCALC, TRIG, CHOLHDL, LDLDIRECT in the last 72 hours. Thyroid function studies No results for input(s): TSH, T4TOTAL, T3FREE, THYROIDAB in the last 72 hours.  Invalid input(s): FREET3 Anemia work up Recent Labs    08/20/21 1402  FERRITIN 3.5*  TIBC 399  IRON 9*   Urinalysis    Component Value Date/Time   COLORURINE YELLOW 04/24/2019 0831   APPEARANCEUR CLEAR 04/24/2019 0831   LABSPEC 1.020 04/24/2019 0831   PHURINE 7.0 04/24/2019 0831   GLUCOSEU NEGATIVE 04/24/2019 0831   HGBUR NEGATIVE 04/24/2019 0831   BILIRUBINUR NEGATIVE 04/24/2019 0831   KETONESUR NEGATIVE 04/24/2019 0831   PROTEINUR NEGATIVE 09/16/2008 1509   UROBILINOGEN 0.2 04/24/2019 0831   NITRITE NEGATIVE 04/24/2019 0831   LEUKOCYTESUR NEGATIVE 04/24/2019 0831   Sepsis Labs Invalid input(s): PROCALCITONIN,  WBC,  LACTICIDVEN Microbiology Recent Results (from the past 240 hour(s))  Resp Panel by RT-PCR (Flu A&B, Covid) Nasopharyngeal Swab     Status: None    Collection Time: 08/22/21  3:20 PM   Specimen: Nasopharyngeal Swab; Nasopharyngeal(NP) swabs in vial transport medium  Result Value Ref Range Status   SARS Coronavirus 2 by RT PCR NEGATIVE NEGATIVE Final    Comment: (NOTE) SARS-CoV-2 target nucleic acids are NOT DETECTED.  The SARS-CoV-2 RNA is generally detectable in upper respiratory specimens during the acute phase of infection. The lowest concentration of SARS-CoV-2 viral copies this assay can detect is 138 copies/mL. A negative result  does not preclude SARS-Cov-2 infection and should not be used as the sole basis for treatment or other patient management decisions. A negative result may occur with  improper specimen collection/handling, submission of specimen other than nasopharyngeal swab, presence of viral mutation(s) within the areas targeted by this assay, and inadequate number of viral copies(<138 copies/mL). A negative result must be combined with clinical observations, patient history, and epidemiological information. The expected result is Negative.  Fact Sheet for Patients:  EntrepreneurPulse.com.au  Fact Sheet for Healthcare Providers:  IncredibleEmployment.be  This test is no t yet approved or cleared by the [place] FDA and  has been authorized for detection and/or diagnosis of SARS-CoV-2 by FDA under an Emergency Use Authorization (EUA). This EUA will remain  in effect (meaning this test can be used) for the duration of the COVID-19 declaration under Section 564(b)(1) of the Act, 21 U.S.C.section 360bbb-3(b)(1), unless the authorization is terminated  or revoked sooner.       Influenza A by PCR NEGATIVE NEGATIVE Final   Influenza B by PCR NEGATIVE NEGATIVE Final    Comment: (NOTE) The Xpert Xpress SARS-CoV-2/FLU/RSV plus assay is intended as an aid in the diagnosis of influenza from Nasopharyngeal swab specimens and should not be used as a sole basis for treatment.  Nasal washings and aspirates are unacceptable for Xpert Xpress SARS-CoV-2/FLU/RSV testing.  Fact Sheet for Patients: EntrepreneurPulse.com.au  Fact Sheet for Healthcare Providers: IncredibleEmployment.be  This test is not yet approved or cleared by the [place] FDA and has been authorized for detection and/or diagnosis of SARS-CoV-2 by FDA under an Emergency Use Authorization (EUA). This EUA will remain in effect (meaning this test can be used) for the duration of the COVID-19 declaration under Section 564(b)(1) of the Act, 21 U.S.C. section 360bbb-3(b)(1), unless the authorization is terminated or revoked.  Performed at [facility], [place] [address] [place], [place] [address]      Time coordinating discharge:  28 minutes  SIGNED:   Barb Merino, MD  [facility] 08/23/2021, 9:21 AM

## 2021-08-23 NOTE — Plan of Care (Signed)
  Problem: Education: Goal: Knowledge of General Education information will improve Description: Including pain rating scale, medication(s)/side effects and non-pharmacologic comfort measures Outcome: Adequate for Discharge   Problem: Health Behavior/Discharge Planning: Goal: Ability to manage health-related needs will improve 08/23/2021 1127 by Jerene Pitch, RN Outcome: Adequate for Discharge 08/23/2021 1041 by Jerene Pitch, RN Outcome: Progressing   Problem: Clinical Measurements: Goal: Ability to maintain clinical measurements within normal limits will improve 08/23/2021 1127 by Jerene Pitch, RN Outcome: Adequate for Discharge 08/23/2021 1041 by Jerene Pitch, RN Outcome: Progressing Goal: Will remain free from infection 08/23/2021 1127 by Jerene Pitch, RN Outcome: Adequate for Discharge 08/23/2021 1041 by Jerene Pitch, RN Outcome: Progressing Goal: Diagnostic test results will improve 08/23/2021 1127 by Jerene Pitch, RN Outcome: Adequate for Discharge 08/23/2021 1041 by Jerene Pitch, RN Outcome: Progressing Goal: Respiratory complications will improve Outcome: Adequate for Discharge Goal: Cardiovascular complication will be avoided Outcome: Adequate for Discharge   Problem: Activity: Goal: Risk for activity intolerance will decrease 08/23/2021 1127 by Jerene Pitch, RN Outcome: Adequate for Discharge 08/23/2021 1041 by Jerene Pitch, RN Outcome: Progressing   Problem: Nutrition: Goal: Adequate nutrition will be maintained Outcome: Adequate for Discharge   Problem: Coping: Goal: Level of anxiety will decrease Outcome: Adequate for Discharge   Problem: Elimination: Goal: Will not experience complications related to bowel motility Outcome: Adequate for Discharge Goal: Will not experience complications related to urinary retention Outcome: Adequate for Discharge   Problem: Pain Managment: Goal: General experience of comfort will  improve Outcome: Adequate for Discharge   Problem: Safety: Goal: Ability to remain free from injury will improve Outcome: Adequate for Discharge   Problem: Skin Integrity: Goal: Risk for impaired skin integrity will decrease Outcome: Adequate for Discharge

## 2021-08-23 NOTE — Plan of Care (Signed)
?  Problem: Health Behavior/Discharge Planning: ?Goal: Ability to manage health-related needs will improve ?Outcome: Progressing ?  ?Problem: Clinical Measurements: ?Goal: Ability to maintain clinical measurements within normal limits will improve ?Outcome: Progressing ?Goal: Will remain free from infection ?Outcome: Progressing ?Goal: Diagnostic test results will improve ?Outcome: Progressing ?  ?Problem: Activity: ?Goal: Risk for activity intolerance will decrease ?Outcome: Progressing ?  ?

## 2021-08-23 NOTE — Progress Notes (Signed)
Discharge instructions provided and discussed. Addressed all questions and concerns. IV removed intact.  Gregory [name]

## 2021-08-24 LAB — BPAM RBC
Blood Product Expiration Date: 202209212359
Blood Product Expiration Date: 202209212359
ISSUE DATE / TIME: 202209032135
ISSUE DATE / TIME: 202209032349
Unit Type and Rh: 9500
Unit Type and Rh: 9500

## 2021-08-24 LAB — TYPE AND SCREEN
ABO/RH(D): O NEG
Antibody Screen: NEGATIVE
Unit division: 0
Unit division: 0

## 2021-08-25 ENCOUNTER — Encounter (HOSPITAL_COMMUNITY): Payer: Medicare Other

## 2021-08-27 ENCOUNTER — Ambulatory Visit (INDEPENDENT_AMBULATORY_CARE_PROVIDER_SITE_OTHER): Payer: Medicare Other | Admitting: Nurse Practitioner

## 2021-08-27 ENCOUNTER — Telehealth: Payer: Self-pay | Admitting: Pharmacist

## 2021-08-27 ENCOUNTER — Encounter: Payer: Self-pay | Admitting: *Deleted

## 2021-08-27 ENCOUNTER — Encounter: Payer: Self-pay | Admitting: Nurse Practitioner

## 2021-08-27 ENCOUNTER — Other Ambulatory Visit: Payer: Self-pay

## 2021-08-27 VITALS — BP 142/70 | HR 70 | Temp 97.7°F | Ht 70.0 in | Wt 190.4 lb

## 2021-08-27 DIAGNOSIS — R58 Hemorrhage, not elsewhere classified: Secondary | ICD-10-CM | POA: Diagnosis not present

## 2021-08-27 DIAGNOSIS — D649 Anemia, unspecified: Secondary | ICD-10-CM | POA: Diagnosis not present

## 2021-08-27 NOTE — Progress Notes (Signed)
Subjective:  Patient ID: Gregory [name], male    DOB: 1941/05/23  Age: 80 y.o. MRN: [id number]  CC:  Chief Complaint  Patient presents with   Follow-up    Pt states he had a infusion done and where the IV was at they left a bruise (L) arm.       HPI  This patient arrives today for an acute visit for the above.  He is recently seen in the emergency department for severe anemia with a hemoglobin of 6.5.  He did have red blood cell transfusions for this.  He tells me that while he was there one of his IVs that were in place started to swell and left a bruise.  He wanted to be evaluated today to make sure the area on his arm looked like it was healing appropriately.  He also tells me that he has an appointment scheduled with hematology next week for further evaluation of his anemia.  He also plans on calling his gastroenterologist to schedule a follow-up for evaluation of the anemia as well.  He tells me he had a colonoscopy about 2 months ago which was normal, he also tells me his stool was checked while he was in the hospital and was negative for blood.  Since leaving the hospital he has been put on iron supplementation and tells me that his stool is a dark color now due to this, but has not noted any obvious blood in the stool.  He also tells me at the time that his hemoglobin was found to be quite low he was having some dizziness, fatigue, and shortness of breath but he is no longer experiencing these symptoms since having the blood transfusion.   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   History of nonmelanoma skin cancer EXCISION SQUAMOUS CELL FROM HAND   Hypertrophy of prostate with urinary obstruction and other lower urinary tract symptoms (LUTS)    Irritable bowel syndrome    Lumbago     Other diseases of nasal cavity and sinuses(478.19)    Persistent disorder of initiating or maintaining sleep    Plantar fascial fibromatosis    Pure hypercholesterolemia    Telangiectasia, hereditary hemorrhagic, of Rendu, 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  Has 2 sons.  Retired Veterinary surgeon.  Education: Masters degree.      05/09/19- Pt states that his balance is worse since last visit. He has not fallen but has come close on a few occassions. The weakness on his right side is the same, he states.      Right Handed   Drinks caffeine    Social History   Tobacco Use   Smoking status: Never   Smokeless tobacco: Never  Substance Use Topics  Alcohol use: No    Alcohol/week: 0.0 standard drinks     Current Meds  Medication Sig   acetaminophen (TYLENOL) 650 MG CR tablet Take 1,300 mg by mouth 2 (two) times daily.   amoxicillin (AMOXIL) 500 MG capsule SMARTSIG:4 Capsule(s) By Mouth PRN (Patient taking differently: Take 2,000 mg by mouth See admin instructions. Take 4 capsules (2000 mg) by mouth one hour prior to dental appointments)   Cholecalciferol 125 MCG (5000 UT) capsule Take 5,000 Units by mouth daily.   clidinium-chlordiazePOXIDE (LIBRAX) 5-2.5 MG capsule Take 1 capsule by mouth 3 (three) times daily before meals. (Patient taking differently: Take 1 capsule by mouth daily as needed (cramps/IBS).)   Dextromethorphan-guaiFENesin (ROBITUSSIN DM PO) Take 1 capsule by mouth 2 (two) times daily. Chronic cough   gabapentin (NEURONTIN) 100 MG capsule Take 1 capsule (100 mg total) by  mouth 2 (two) times daily. (Patient taking differently: Take 200 mg by mouth 2 (two) times daily.)   Iron, Ferrous Sulfate, 325 (65 Fe) MG TABS Take 325 mg by mouth in the morning, at noon, and at bedtime.   levocetirizine (XYZAL) 5 MG tablet Take 1 tablet (5 mg total) by mouth every evening.   levothyroxine (SYNTHROID) 125 MCG tablet Take 1 tablet (125 mcg total) by mouth daily before breakfast.   Magnesium 250 MG TABS Take 250 mg by mouth at bedtime.    meclizine (ANTIVERT) 25 MG tablet Take 25 mg by mouth 2 (two) times daily as needed for dizziness.   methocarbamol (ROBAXIN) 500 MG tablet Take 500 mg by mouth every 6 (six) hours as needed for muscle spasms.   Misc Natural Products (SAW PALMETTO) CAPS Take 1 capsule by mouth at bedtime.   montelukast (SINGULAIR) 10 MG tablet TAKE 1 TABLET BY MOUTH AT  BEDTIME (Patient taking differently: Take 10 mg by mouth at bedtime.)   Multiple Vitamin (MULTIVITAMIN WITH MINERALS) TABS tablet Take 1 tablet by mouth every morning.   pantoprazole (PROTONIX) 40 MG tablet Take 1 tablet (40 mg total) by mouth 2 (two) times daily.   silodosin (RAPAFLO) 8 MG CAPS capsule Take 1 capsule (8 mg total) by mouth at bedtime. TAKE ONE CAPSULE BY MOUTH DAILY AT BEDTIME (Patient taking differently: Take 8 mg by mouth at bedtime.)    ROS:  Review of Systems  Constitutional:  Negative for malaise/fatigue.  Respiratory:  Negative for shortness of breath.   Cardiovascular:  Negative for chest pain.  Gastrointestinal:  Positive for melena (patient now on iron). Negative for blood in stool.  Neurological:  Negative for dizziness.    Objective:   Today's Vitals: BP (!) 142/70 (BP Location: Right Arm)   Pulse 70   Temp 97.7 F (36.5 C) (Oral)   Ht '5\' 10"'$  (1.778 m)   Wt 190 lb 6.4 oz (86.4 kg)   SpO2 94%   BMI 27.32 kg/m  Vitals with BMI 08/27/2021 08/23/2021 08/23/2021  Height '5\' 10"'$  - -  Weight 190 lbs 6 oz - -  BMI [id number] - -  Systolic [id number]  Diastolic 70 63 70   Pulse 70 62 78     Physical Exam Vitals reviewed.  Constitutional:      Appearance: Normal appearance.  HENT:     Head: Normocephalic and atraumatic.  Cardiovascular:     Rate and Rhythm: Normal rate and regular rhythm.  Pulmonary:     Effort: Pulmonary effort is normal.     Breath sounds: Normal breath sounds.  Musculoskeletal:  Cervical back: Neck supple.  Skin:    General: Skin is warm and dry.          Comments: Redline indicates area of discoloration.  Skin appears to be yellow, green, and purple.  No swelling or tenderness noted.  Neurological:     Mental Status: He is alert and oriented to person, place, and time.     Sensory: Sensation is intact.     Motor: Motor function is intact.  Psychiatric:        Mood and Affect: Mood normal.        Behavior: Behavior normal.        Thought Content: Thought content normal.        Judgment: Judgment normal.         Assessment and Plan   1. Ecchymosis   2. Anemia, unspecified type      Plan: 1.  Ecchymosis to left arm appears to be healing appropriately.  I recommend patient monitor himself over the next couple weeks and if the skin discoloration does not improve that he should notify this office for further evaluation. 2.  I encouraged patient to make sure to follow-up with hematology and to call his gastroenterologist for further evaluation of his anemia.  Consider checking CBC today, however he will see hematologist next week and he is asymptomatic at this time so we will let hematologist determine work-up at his appointment next week.  He was encouraged to call his office if he feels unwell between now and his next appointment.   Tests ordered No orders of the defined types were placed in this encounter.     No orders of the defined types were placed in this encounter.   Patient to follow-up as scheduled with Dr. Ronnald Ramp in approximately 2 months, or sooner as needed.  Ailene Ards, NP

## 2021-08-27 NOTE — Progress Notes (Addendum)
Chronic Care Management Pharmacy Assistant   Name: Gregory [name]  MRN: [id number] DOB: Jan 20, 1941   Reason for Encounter: Disease State   Conditions to be addressed/monitored: HTN   Recent office visits:  None ID  Recent consult visits:  9/122 Horald Pollen, MD-Internal Medicine (Anemia) No med changes  07/28/21 Hunsucker, Bonna Gains, MD-Pulmonary Disease (Pulmonary arteriovenous malformation) Orders:CT Chest W Contrast and Pulmonary function test No med changes  07/27/21 Lelon Perla, MD-Cardiology (Orthostatic hypotension) no med changes  Hospital visits:  Medication Reconciliation was completed by comparing discharge summary, patient's EMR and Pharmacy list, and upon discussion with patient.  Admitted to the hospital on 08/22/21 due to Anemia. Discharge date was 08/23/21. Discharged from [facility].   -blood and iron transfusions given. Source likely chronic intermittent GI bleed. Needs to f/u with GI for endoscopy  New?Medications Started at [facility] Discharge:?? -started ferrous sulfate   Medications Discontinued at Hospital Discharge: -Stopped mometasone-formoterol   Medications that remain the same after Hospital Discharge:??  -All other medications will remain the same.    Medications: Outpatient Encounter Medications as of 08/27/2021  Medication Sig   acetaminophen (TYLENOL) 650 MG CR tablet Take 1,300 mg by mouth 2 (two) times daily.   amoxicillin (AMOXIL) 500 MG capsule SMARTSIG:4 Capsule(s) By Mouth PRN (Patient taking differently: Take 2,000 mg by mouth See admin instructions. Take 4 capsules (2000 mg) by mouth one hour prior to dental appointments)   Cholecalciferol (VITAMIN D3) 2000 UNITS TABS Take 2,000 Units by mouth every morning.   clidinium-chlordiazePOXIDE (LIBRAX) 5-2.5 MG capsule Take 1 capsule by mouth 3 (three) times daily before meals. (Patient taking differently: Take 1 capsule by mouth daily as needed  (cramps/IBS).)   Dextromethorphan-guaiFENesin (ROBITUSSIN DM PO) Take 1 capsule by mouth 2 (two) times daily. Chronic cough   gabapentin (NEURONTIN) 100 MG capsule Take 1 capsule (100 mg total) by mouth 2 (two) times daily. (Patient taking differently: Take 200 mg by mouth 2 (two) times daily.)   Iron, Ferrous Sulfate, 325 (65 Fe) MG TABS Take 325 mg by mouth in the morning, at noon, and at bedtime.   levocetirizine (XYZAL) 5 MG tablet Take 1 tablet (5 mg total) by mouth every evening.   levothyroxine (SYNTHROID) 125 MCG tablet Take 1 tablet (125 mcg total) by mouth daily before breakfast.   Magnesium 250 MG TABS Take 250 mg by mouth at bedtime.    meclizine (ANTIVERT) 25 MG tablet Take 25 mg by mouth 2 (two) times daily as needed for dizziness.   methocarbamol (ROBAXIN) 500 MG tablet Take 500 mg by mouth every 6 (six) hours as needed for muscle spasms.   Misc Natural Products (SAW PALMETTO) CAPS Take 1 capsule by mouth at bedtime.   montelukast (SINGULAIR) 10 MG tablet TAKE 1 TABLET BY MOUTH AT  BEDTIME (Patient taking differently: Take 10 mg by mouth at bedtime.)   Multiple Vitamin (MULTIVITAMIN WITH MINERALS) TABS tablet Take 1 tablet by mouth every morning.   pantoprazole (PROTONIX) 40 MG tablet Take 1 tablet (40 mg total) by mouth 2 (two) times daily.   silodosin (RAPAFLO) 8 MG CAPS capsule Take 1 capsule (8 mg total) by mouth at bedtime. TAKE ONE CAPSULE BY MOUTH DAILY AT BEDTIME (Patient taking differently: Take 8 mg by mouth at bedtime.)   No facility-administered encounter medications on file as of 08/27/2021.    Recent Office Vitals: BP Readings from Last 3 Encounters:  08/23/21 115/63  08/20/21 140/60  07/28/21 [name])  118/58   Pulse Readings from Last 3 Encounters:  08/23/21 62  08/20/21 71  07/28/21 74    Wt Readings from Last 3 Encounters:  08/22/21 182 lb (82.6 kg)  08/20/21 191 lb (86.6 kg)  07/28/21 191 lb (86.6 kg)     Kidney Function Lab Results  Component Value  Date/Time   CREATININE 0.86 08/23/2021 04:18 AM   CREATININE 1.04 08/22/2021 09:43 AM   GFR 70.23 08/20/2021 02:02 PM   GFRNONAA >60 08/23/2021 04:18 AM   GFRAA >60 06/01/2020 11:06 AM    BMP Latest Ref Rng & Units 08/23/2021 08/22/2021 08/20/2021  Glucose 70 - 99 mg/dL 97 119(H) 94  BUN 8 - 23 mg/dL '13 14 12  '$ Creatinine 0.61 - 1.24 mg/dL 0.86 1.04 1.02  Sodium 135 - 145 mmol/L 132(L) 128(L) 122(L)  Potassium 3.5 - 5.1 mmol/L 4.4 4.3 4.5  Chloride 98 - 111 mmol/L 101 96(L) 90(L)  CO2 22 - 32 mmol/L '24 25 26  '$ Calcium 8.9 - 10.3 mg/dL 8.9 8.8(L) 9.0     Contacted patient on 08/27/21 to discuss hypertension disease state  Current antihypertensive regimen:  Patient is not taking any blood pressure medication at this time  Patient verbally confirms he is taking the above medications as directed. Yes  How often are you checking your Blood Pressure? 3-5x per week  he checks his blood pressure  normally  after taking his medication.  Current home BP readings: None today  DATE:             BP               PULSE 06/21/21   120/21  65 06/22/21   123/51  68   Wrist or arm cuff:arm cuff Caffeine intake:NA Salt intake:Only what comes on the food OTC medications including pseudoephedrine or NSAIDs?  Any readings above 180/120? No  What recent interventions/DTPs have been made by any provider to improve Blood Pressure control since last CPP Visit: none noted  Any recent hospitalizations or ED visits since last visit with CPP? No  What diet changes have been made to improve Blood Pressure Control?  Patient states he has not made any changes to diet  What exercise is being done to improve your Blood Pressure Control?  Patient states that he does moderate exercises  Adherence Review: Is the patient currently on ACE/ARB medication? No Does the patient have >5 day gap between last estimated fill dates? No   Star Rating Drugs: None ID Medication:  Last Fill: Day Supply    Care  Gaps: Annual wellness visit in last year? Yes Most Recent BP reading:115/63-08/23/21   PCP appointment on 10/26/21    [place] Pharmacist Assistant [phone]

## 2021-09-03 ENCOUNTER — Encounter: Payer: Self-pay | Admitting: *Deleted

## 2021-09-03 NOTE — Progress Notes (Signed)
New Hematology/Oncology Consult   Requesting MD: Dr. 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  He had preoperative labs done on 08/18/2021 prior to a planned TURP procedure.  The CBC returned with a hemoglobin of 6.8, MCV 58.6, normal white count and platelets.  He had repeat labs done on 08/20/2021 which showed a hemoglobin of 6.6, MCV 59, ferritin 3.5.  He was directed to the emergency department on 08/22/2021, admitted overnight and transfused 2 units of blood.  He also received a dose of Venofer 200 mg.  Stool was negative for occult blood.  He was discharged home on 08/23/2021.  CT chest 08/17/2021 showed stable signs of a large right lower lobe pulmonary AVM.  Review of the EMR shows a colonoscopy was done on 06/29/2021 with findings of a 6 mm polyp in the sigmoid colon, diverticulosis sigmoid colon, small internal hemorrhoids, next colonoscopy recommended at a 5-year interval; upper endoscopy 09/21/2016 with gastric AVMs and duodenal AVMs.  He has a history of a brain abscess 2016.  He reports a history of frequent nosebleeds, less over time.  He estimates 1/week, typically lasting about 10 minutes.  Nosebleeds resolved with pressure.  He reports remote history of a nasal skin graft secondary to bleeding.  He has taken iron in the past, discontinued about 4 years ago, resumed following recent hospitalization.  Aside from epistaxis he is not aware of other bleeding.  Stools have been black since beginning oral iron.  He feels better since receiving the blood transfusion but continues to note dyspnea on exertion.  No chest pain.  Family history significant for father with HHT and paternal aunt with HHT.  His mother died with pancreas cancer.   Past Medical History:  Diagnosis Date   Anxiety state, unspecified    Arthritis    Benign neoplasm  of colon    GERD (gastroesophageal reflux disease)    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  WITHOUT HEMOPTYSIS   History of nonmelanoma skin cancer EXCISION SQUAMOUS CELL FROM HAND   Hypertrophy of prostate with urinary obstruction and other lower urinary tract symptoms (LUTS)    Irritable bowel syndrome    Lumbago    Other diseases of nasal cavity and sinuses(478.19)    Persistent disorder of initiating or maintaining sleep    Plantar fascial fibromatosis    Pure hypercholesterolemia    Telangiectasia, hereditary hemorrhagic, of Rendu, 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)   SKIN, LIPS, NASAL W/ PREVIOUS NOSE BLEEDS  AMD GI TELANGIECTASIA   Unspecified hypothyroidism   :   Past Surgical History:  Procedure Laterality Date   APPLICATION OF CRANIAL NAVIGATION N/A 05/16/2015   Procedure: APPLICATION OF CRANIAL NAVIGATION;  Surgeon: Consuella Lose, MD;  Location: [facility];  Service: Neurosurgery;  Laterality: N/A;   BRAIN BIOPSY Left 05/16/2015   Procedure: Stereotactic Left Brain Biopsy with Brain Lab;  Surgeon: Consuella Lose, MD;  Location: [facility];  Service: Neurosurgery;  Laterality: Left;  Stereotactic Left Brain biopsy with brainlab   BRAIN SURGERY     CARDIOVASCULAR STRESS TEST  01-14-2003   NO ISCHEMIA / EF 61%/ NORMAL LE WALL MOTION   EXCISION LEFT WRIST GANGLIAN/ MYXOID CYST  05-30-2009   IR RADIOLOGIST EVAL & MGMT  02/01/2019   NASAL SINUS SURGERY     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 Renda Rolls   TRANSTHORACIC ECHOCARDIOGRAM  10-17-2008   DR CRENSHAW   NORMAL LVF/ EF 60%/  MILDLY DILATED RIGHT ATRIUM/ VENTRICULE   VARICOCELECTOMY  1991   Dr. Tresa Endo  :   Current Outpatient Medications:    acetaminophen (TYLENOL) 650 MG CR tablet, Take 1,300 mg by mouth 2 (two) times  daily., Disp: , Rfl:    amoxicillin (AMOXIL) 500 MG capsule, SMARTSIG:4 Capsule(s) By Mouth PRN (Patient taking differently: Take 2,000 mg by mouth See admin instructions. Take 4 capsules (2000 mg) by mouth one hour prior to dental appointments), Disp: 4 capsule, Rfl: 3   Cholecalciferol 125 MCG (5000 UT) capsule, Take 5,000 Units by mouth daily., Disp: , Rfl:    clidinium-chlordiazePOXIDE (LIBRAX) 5-2.5 MG capsule, Take 1 capsule by mouth 3 (three) times daily before meals. (Patient taking differently: Take 1 capsule by mouth daily as needed (cramps/IBS).), Disp: 90 capsule, Rfl: 3   Dextromethorphan-guaiFENesin (ROBITUSSIN DM PO), Take 1 capsule by mouth 2 (two) times daily. Chronic cough, Disp: , Rfl:    gabapentin (NEURONTIN) 100 MG capsule, Take 1 capsule (100 mg total) by mouth 2 (two) times daily. (Patient taking differently: Take 200 mg by mouth 2 (two) times daily.), Disp: 180 capsule, Rfl: 1   Iron, Ferrous Sulfate, 325 (65 Fe) MG TABS, Take 325 mg by mouth in the morning, at noon, and at bedtime., Disp: 30 tablet, Rfl: 2   levocetirizine (XYZAL) 5 MG tablet, Take 1 tablet (5 mg total) by mouth every evening., Disp: 90 tablet, Rfl: 1   levothyroxine (SYNTHROID) 125 MCG tablet, Take 1 tablet (125 mcg total) by mouth daily before breakfast., Disp: 90 tablet, Rfl: 1   Magnesium 250 MG TABS, Take 250 mg by mouth at bedtime. , Disp: , Rfl:    meclizine (ANTIVERT) 25 MG tablet, Take 25 mg by mouth 2 (two) times daily as needed for dizziness., Disp: , Rfl:    methocarbamol (ROBAXIN) 500 MG tablet, Take 500 mg by mouth every 6 (six) hours as needed for muscle spasms., Disp: , Rfl:    Misc Natural Products (SAW PALMETTO) CAPS, Take 1 capsule by mouth at bedtime., Disp: , Rfl:    montelukast (SINGULAIR) 10 MG tablet, TAKE 1 TABLET BY MOUTH AT  BEDTIME (Patient taking differently: Take 10 mg by mouth at bedtime.), Disp: 90 tablet, Rfl: 1   Multiple Vitamin (MULTIVITAMIN WITH MINERALS) TABS tablet, Take  1 tablet by mouth every morning., Disp: , Rfl:    pantoprazole (PROTONIX) 40 MG tablet, Take 1 tablet (40 mg total) by mouth 2 (two) times daily., Disp: 180 tablet, Rfl: 1   silodosin (RAPAFLO) 8 MG CAPS capsule, Take 1 capsule (8 mg total) by mouth at bedtime. TAKE ONE CAPSULE BY MOUTH DAILY AT BEDTIME (Patient taking differently: Take 8 mg by mouth at bedtime.), Disp: 90 capsule, Rfl: 1:     Allergies  Allergen Reactions   Nsaids Other (See Comments)    Cannot take-per MD   Other Other (See Comments)    All blood thinners-cannot take per MD   Aspirin Other (See Comments)    nosebleeds   Statins Other (See Comments)    Weakness, myalgias   Sulfa Antibiotics Other (See Comments)    Unknown reaction   Tizanidine Hcl Other (See Comments)  Patient stated that after taking this medication he experienced feelings of dizziness and disorientation.   :  FH: Father with HHT.  Paternal aunt with HHT.  Mother deceased with pancreas cancer.  SOCIAL HISTORY: He lives in [place].  He is married.  He has 2 children, neither with HHT.  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  No tobacco or alcohol use.  Review of Systems: In addition to the HPI review of systems as follows-no fevers or sweats.  No anorexia or weight loss.  He reports a chronic cough for about 2-1/2 years, takes frequent Robitussin.  No nausea or vomiting.  No change in bowel habits.  He reports frequent urination, planning TURP in the future.   Physical Exam:  Blood pressure (!) 142/61, pulse 71, temperature (!) 97.4 F (36.3 C), temperature source Temporal, resp. rate 18, weight 187 lb 3.2 oz (84.9 kg), SpO2 95 %.  HEENT: Telangiectasia noted on the tip of the tongue. Lungs: Lungs clear bilaterally. Cardiac: Regular rate and rhythm. Abdomen: Abdomen soft and nontender.  No hepatosplenomegaly. Vascular: No leg edema. Lymph nodes: No palpable cervical, supraclavicular, axillary or inguinal lymph nodes. Neurologic: Alert and  oriented. Skin: Multiple telangiectasias on the lips, skin.  LABS:  RADIOLOGY:  CT Chest W Contrast  Result Date: 08/17/2021 CLINICAL DATA:  A 80 year old male with history of large pulmonary AVM discovered on previous imaging. EXAM: CT CHEST WITH CONTRAST TECHNIQUE: Multidetector CT imaging of the chest was performed during intravenous contrast administration. CONTRAST:  65m ISOVUE-300 IOPAMIDOL (ISOVUE-300) INJECTION 61% COMPARISON:  Comparison made with December 09, 2020. FINDINGS: Cardiovascular: Calcified atheromatous plaque in the thoracic aorta. This is mild. Normal heart size without substantial pericardial effusion. Normal caliber of the thoracic aorta. Central pulmonary vasculature is normal caliber centrally with signs of large RIGHT lower lobe pulmonary AVM measuring 3.6 x 2.2 x 5.3 cm, stable when compared to the exam of December of 2021. Mediastinum/Nodes: Mildly patulous esophagus. No adenopathy in the mediastinum, bilateral hila or thoracic inlet. No axillary lymphadenopathy. Lungs/Pleura: Mild ground-glass and basilar atelectasis at the lung bases shows a similar appearance to prior imaging. No new or suspicious pulmonary finding. Mild subpleural reticulation and biapical scarring is stable. Airways are patent. Upper Abdomen: Imaged portions the liver, gallbladder, pancreas, spleen, adrenal glands and kidneys without acute process. Visualized gastrointestinal tract without acute process. No upper abdominal lymphadenopathy. Musculoskeletal: No chest wall abnormality. Spinal degenerative changes. No acute or destructive bone finding. IMPRESSION: Stable signs of large RIGHT lower lobe pulmonary AVM. No new or significant findings. Stable findings of subpleural reticulation and potential mild interstitial disease as before. Aortic atherosclerosis. Aortic Atherosclerosis (ICD10-I70.0). Electronically Signed   By: GZetta BillsM.D.   On: 08/17/2021 15:07    Assessment and Plan:   Iron  deficiency anemia  08/18/2021 hemoglobin 6.8, MCV 58 08/20/2021 hemoglobin 6.6 MCV 59, ferritin 3 08/22/2021 hemoglobin 6.5, stool negative for occult blood, transfused 2 units of blood, Venofer 200 mg IV 08/23/2021 hemoglobin 8.3 HHT Pulmonary AVM right lower lobe, followed by Dr. HSilas Floodpulmonary Gastric and duodenal AVMs 09/21/2016 upper endoscopy-normal esophagus, gastric AVMs, duodenal AVMs History of brain abscess 2016  Dyspnea on exertion likely secondary to anemia GERD  Mr. JPakerhas been referred for evaluation of severe iron deficiency anemia in the setting of HHT.  He has known gastric/duodenal AVMs and pulmonary AVM.  His main manifestation of bleeding has been epistaxis which has become less over time.  He was found to have severe iron deficiency  anemia recently, required red cell transfusion support, received a dose of Venofer and is now on ferrous sulfate 3 times a day.  We recommend he continue oral iron as he is currently taking.  He will return to our lab today for a follow-up CBC, ferritin, urinalysis.  He will complete a set of stool cards.  We recommend he continue follow-up with pulmonary and GI.  He will return for lab and an office visit in approximately 3 months.  We are available to see him sooner if needed.  Patient seen with Dr. Benay Spice.   Ned Card, NP 09/04/2021, 8:52 AM   This was a shared visit with Ned Card.  Mr. Derita was interviewed and examined.  He is referred for hematology evaluation of anemia.  This occurs in the setting of HHT.  He has iron deficiency anemia.  The anemia has improved following transfusion earlier this month and initiation of iron therapy.  The iron deficiency is likely related to chronic GI blood loss in the setting of HHT.  We recommend he continue oral iron replacement.  He will continue follow-up with pulmonary medicine, his gastroenterologist, and primary provider for management of the HHT to include screening for AVMs.   There is no indication for systemic treatment of the HHT at present.  I was present for greater than 50% of today's visit.  I performed medical decision making.  Julieanne Manson, MD

## 2021-09-04 ENCOUNTER — Telehealth: Payer: Self-pay

## 2021-09-04 ENCOUNTER — Other Ambulatory Visit: Payer: Self-pay

## 2021-09-04 ENCOUNTER — Inpatient Hospital Stay: Payer: Medicare Other

## 2021-09-04 ENCOUNTER — Inpatient Hospital Stay: Payer: Medicare Other | Attending: Nurse Practitioner | Admitting: Nurse Practitioner

## 2021-09-04 ENCOUNTER — Telehealth: Payer: Self-pay | Admitting: Oncology

## 2021-09-04 VITALS — BP 142/61 | HR 71 | Temp 97.4°F | Resp 18 | Wt 187.2 lb

## 2021-09-04 DIAGNOSIS — I780 Hereditary hemorrhagic telangiectasia: Secondary | ICD-10-CM

## 2021-09-04 DIAGNOSIS — D500 Iron deficiency anemia secondary to blood loss (chronic): Secondary | ICD-10-CM

## 2021-09-04 DIAGNOSIS — D509 Iron deficiency anemia, unspecified: Secondary | ICD-10-CM | POA: Diagnosis not present

## 2021-09-04 DIAGNOSIS — Q2572 Congenital pulmonary arteriovenous malformation: Secondary | ICD-10-CM | POA: Insufficient documentation

## 2021-09-04 DIAGNOSIS — R040 Epistaxis: Secondary | ICD-10-CM

## 2021-09-04 LAB — URINALYSIS, COMPLETE (UACMP) WITH MICROSCOPIC
Bilirubin Urine: NEGATIVE
Glucose, UA: NEGATIVE mg/dL
Hgb urine dipstick: NEGATIVE
Ketones, ur: NEGATIVE mg/dL
Leukocytes,Ua: NEGATIVE
Nitrite: NEGATIVE
Specific Gravity, Urine: 1.017 (ref 1.005–1.030)
pH: 6.5 (ref 5.0–8.0)

## 2021-09-04 LAB — CBC WITH DIFFERENTIAL (CANCER CENTER ONLY)
Abs Immature Granulocytes: 0.01 10*3/uL (ref 0.00–0.07)
Basophils Absolute: 0 10*3/uL (ref 0.0–0.1)
Basophils Relative: 1 %
Eosinophils Absolute: 0.1 10*3/uL (ref 0.0–0.5)
Eosinophils Relative: 1 %
HCT: 36 % — ABNORMAL LOW (ref 39.0–52.0)
Hemoglobin: 11 g/dL — ABNORMAL LOW (ref 13.0–17.0)
Immature Granulocytes: 0 %
Lymphocytes Relative: 32 %
Lymphs Abs: 1.8 10*3/uL (ref 0.7–4.0)
MCH: 22.5 pg — ABNORMAL LOW (ref 26.0–34.0)
MCHC: 30.6 g/dL (ref 30.0–36.0)
MCV: 73.6 fL — ABNORMAL LOW (ref 80.0–100.0)
Monocytes Absolute: 0.7 10*3/uL (ref 0.1–1.0)
Monocytes Relative: 12 %
Neutro Abs: 3 10*3/uL (ref 1.7–7.7)
Neutrophils Relative %: 54 %
Platelet Count: 413 10*3/uL — ABNORMAL HIGH (ref 150–400)
RBC: 4.89 MIL/uL (ref 4.22–5.81)
RDW: 32.3 % — ABNORMAL HIGH (ref 11.5–15.5)
WBC Count: 5.5 10*3/uL (ref 4.0–10.5)
nRBC: 0 % (ref 0.0–0.2)

## 2021-09-04 LAB — FERRITIN: Ferritin: 60 ng/mL (ref 24–336)

## 2021-09-04 LAB — SAVE SMEAR(SSMR), FOR PROVIDER SLIDE REVIEW

## 2021-09-04 LAB — SAMPLE TO BLOOD BANK

## 2021-09-04 NOTE — Telephone Encounter (Signed)
Scheduled appt per 9/16 los - called pt and he is aware of appt date and time

## 2021-09-04 NOTE — Telephone Encounter (Signed)
Called spoke to pt concerning most recent lab results continue oral iron and f/u as schedule

## 2021-09-04 NOTE — Telephone Encounter (Signed)
-----   Message from Owens Shark, NP sent at 09/04/2021  2:08 PM EDT ----- Please let him know hemoglobin and ferritin are better.  Continue oral iron.  Follow-up as scheduled.

## 2021-09-11 ENCOUNTER — Other Ambulatory Visit: Payer: Self-pay

## 2021-09-11 ENCOUNTER — Ambulatory Visit (INDEPENDENT_AMBULATORY_CARE_PROVIDER_SITE_OTHER): Payer: Medicare Other

## 2021-09-11 DIAGNOSIS — Z23 Encounter for immunization: Secondary | ICD-10-CM

## 2021-09-11 NOTE — Progress Notes (Signed)
Pt was given the high Dose Flu Vacc w/o any complications

## 2021-09-13 DIAGNOSIS — D509 Iron deficiency anemia, unspecified: Secondary | ICD-10-CM | POA: Diagnosis not present

## 2021-09-14 ENCOUNTER — Other Ambulatory Visit (HOSPITAL_BASED_OUTPATIENT_CLINIC_OR_DEPARTMENT_OTHER): Payer: Self-pay

## 2021-09-14 DIAGNOSIS — D500 Iron deficiency anemia secondary to blood loss (chronic): Secondary | ICD-10-CM

## 2021-09-14 DIAGNOSIS — I780 Hereditary hemorrhagic telangiectasia: Secondary | ICD-10-CM

## 2021-09-14 LAB — OCCULT BLOOD X 1 CARD TO LAB, STOOL
Fecal Occult Bld: POSITIVE — AB
Fecal Occult Bld: POSITIVE — AB
Fecal Occult Bld: POSITIVE — AB

## 2021-09-16 ENCOUNTER — Encounter: Payer: Self-pay | Admitting: Pulmonary Disease

## 2021-09-16 ENCOUNTER — Telehealth: Payer: Self-pay | Admitting: Internal Medicine

## 2021-09-16 ENCOUNTER — Ambulatory Visit (INDEPENDENT_AMBULATORY_CARE_PROVIDER_SITE_OTHER): Payer: Medicare Other | Admitting: Pulmonary Disease

## 2021-09-16 ENCOUNTER — Other Ambulatory Visit: Payer: Self-pay

## 2021-09-16 VITALS — BP 140/62 | HR 70 | Ht 70.0 in | Wt 187.4 lb

## 2021-09-16 DIAGNOSIS — R0600 Dyspnea, unspecified: Secondary | ICD-10-CM | POA: Diagnosis not present

## 2021-09-16 DIAGNOSIS — R0609 Other forms of dyspnea: Secondary | ICD-10-CM

## 2021-09-16 DIAGNOSIS — R053 Chronic cough: Secondary | ICD-10-CM | POA: Diagnosis not present

## 2021-09-16 NOTE — Progress Notes (Signed)
@Patient  ID: Gregory [name], male    DOB: 06/08/1941, 80 y.o.   MRN: [id number]  Chief Complaint  Patient presents with   Follow-up    DOE better since found out his iron was low and hbg 6.2    Referring provider: Janith Lima, MD  HPI:   80 y.o. man here for follow up of dyspnea and chronic cough.  At last visit, he was complaining of dyspnea, presyncope.  Was concern for growth of AVM in the lung and increased right to left shunting leading to hypoxemia.  CT chest with contrast showed stable AVM largest in the right lower lobe.  It also showed chronic posterior/dependent areas of groundglass without frank honeycombing or interlobular septal thickening.  There is stable compared to 2019 on serial scans based on my review of 3 CT scans.  We obtain PFTs which showed normal spirometry, mild restriction with severely reduced DLCO.  Fortunately, someone thought to check hemoglobin and he was found to have hemoglobin of 6.8.  He is started on iron supplementation.  Got blood transfusion.  His symptoms are improving.   HPI at initial visit: Patient has chronic cough over the last 2 years.  When asked in more detail he admits cough is been present for well over a decade.  Unclear how much change there has been since last seen by pulmonary some years ago.  Does seem that the frequency has worsened over the last couple of years.  Worse at night but occurs throughout the day.  It is nonproductive.  Uses a variety of dextromethorphan containing products that do help, he rotates through these 2 or 3 products as they seem to wear off and affect.  He is currently on Breo does not seem to help.  Recently resumed gabapentin at low-dose, 100 mg twice daily with mild improvement.  Notes in the past he was on 200 mg twice daily of gabapentin with significant improvement however he had syncope in the setting of what sounds like polypharmacy.  Prior pulmonary notes indicate that he is cough improved on Dulera.   He identifies no other alleviating or exacerbating factors.  No environmental or seasonal factors that contribute to better or worse cough.  No position where things are better or worse.  He is on a PPI twice daily for a long history of GERD.  Does endorse occasional breakthrough symptoms of heartburn, reflux.  Reports endoscopy, manometry, pH probe in the past although these results are all available for review and he does not recall the results.  Reviewed serial CT scans dating back to 2016, with persistent right lower lobe large AVM and small left lower lobe AVM stable from scan 04/2015, 09/2018, 11/2018, 11/2020 with otherwise clear lungs on my interpretation.  PMH: HHT with pulmonary AVMs, hypertension, GERD Surgical history: Brain surgery for brain abscess, Family history: Mother with diabetes, hypertension, father with kidney failure Social history: Lives in [place], retired, went to [employer], has 3 grandchildren  Licensed conveyancer / Pulmonary Flowsheets:   ACT:  No flowsheet data found.  MMRC: No flowsheet data found.  Epworth:  No flowsheet data found.  Tests:   FENO:  No results found for: NITRICOXIDE  PFT: PFT Results Latest Ref Rng & Units 07/31/2021  FVC-Pre L 2.90  FVC-Predicted Pre % 72  FVC-Post L 2.81  FVC-Predicted Post % 69  Pre FEV1/FVC % % 81  Post FEV1/FCV % % 84  FEV1-Pre L 2.34  FEV1-Predicted Pre % 81  FEV1-Post L  2.38  DLCO uncorrected ml/min/mmHg 10.47  DLCO UNC% % 43  DLCO corrected ml/min/mmHg 10.47  DLCO COR %Predicted % 43  DLVA Predicted % 57  TLC L 5.07  TLC % Predicted % 72  RV % Predicted % 85  Personally reviewed and interpreted as normal spirometry, mildly reduced TLC/mild restriction, severely reduced DLCO.  WALK:  No flowsheet data found.  Imaging: Personally reviewed and as per EMR  Lab Results: Personally reviewed CBC    Component Value Date/Time   WBC 5.5 09/04/2021 0928   WBC 8.0 08/23/2021 0418   RBC 4.89 09/04/2021  0928   HGB 11.0 (L) 09/04/2021 0928   HCT 36.0 (L) 09/04/2021 0928   PLT 413 (H) 09/04/2021 0928   MCV 73.6 (L) 09/04/2021 0928   MCH 22.5 (L) 09/04/2021 0928   MCHC 30.6 09/04/2021 0928   RDW 32.3 (H) 09/04/2021 0928   LYMPHSABS 1.8 09/04/2021 0928   MONOABS 0.7 09/04/2021 0928   EOSABS 0.1 09/04/2021 0928   BASOSABS 0.0 09/04/2021 0928    BMET    Component Value Date/Time   NA 132 (L) 08/23/2021 0418   K 4.4 08/23/2021 0418   CL 101 08/23/2021 0418   CO2 24 08/23/2021 0418   GLUCOSE 97 08/23/2021 0418   BUN 13 08/23/2021 0418   CREATININE 0.86 08/23/2021 0418   CALCIUM 8.9 08/23/2021 0418   GFRNONAA >60 08/23/2021 0418   GFRAA >60 06/01/2020 1106    BNP    Component Value Date/Time   BNP 63.5 12/07/2020 1439    ProBNP    Component Value Date/Time   PROBNP 126.0 (H) 03/19/2021 0959    Specialty Problems       Pulmonary Problems   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     Immunization History  Administered Date(s) Administered   Fluad Quad(high Dose 65+) 08/24/2019, 10/02/2020, 09/11/2021   Influenza Split 09/21/2011, 09/27/2012   Influenza Whole 09/11/2008, 09/11/2009, 09/22/2010   Influenza, High Dose Seasonal PF 09/09/2016, 09/16/2017, 10/03/2018   Influenza,inj,Quad PF,6+ Mos 10/04/2013, 10/08/2014, 10/02/2015   PFIZER Comirnaty(Gray Top)Covid-19 Tri-Sucrose Vaccine  06/08/2021   PFIZER(Purple Top)SARS-COV-2 Vaccination 01/03/2020, 01/23/2020, 10/18/2020   Pneumococcal Conjugate-13 12/07/2016   Pneumococcal Polysaccharide-23 03/12/2010, 06/27/2018   Tdap 10/04/2013   Varicella 12/20/2009    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   History of nonmelanoma skin cancer EXCISION SQUAMOUS CELL FROM HAND   Hypertrophy of prostate with urinary obstruction and other lower urinary tract symptoms (LUTS)    Irritable bowel syndrome    Lumbago    Other diseases of nasal cavity and sinuses(478.19)    Persistent disorder of initiating or maintaining sleep  Plantar fascial fibromatosis    Pure hypercholesterolemia    Telangiectasia, hereditary hemorrhagic, of Rendu, 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   amoxicillin (AMOXIL) 500 MG capsule SMARTSIG:4 Capsule(s) By Mouth PRN (Patient taking differently: Take 2,000 mg by mouth See admin instructions. Take 4 capsules (2000 mg) by mouth one hour prior to dental appointments)   Cholecalciferol 125 MCG (5000 UT) capsule Take 5,000 Units by mouth daily.   Dextromethorphan-guaiFENesin (ROBITUSSIN DM PO) Take 1 capsule by mouth 2 (two) times daily. Chronic cough   gabapentin (NEURONTIN) 100 MG capsule Take 1 capsule (100 mg total) by mouth 2 (two) times daily. (Patient taking differently: Take 200 mg by mouth 2 (two)  times daily.)   Iron, Ferrous Sulfate, 325 (65 Fe) MG TABS Take 325 mg by mouth in the morning, at noon, and at bedtime.   levocetirizine (XYZAL) 5 MG tablet Take 1 tablet (5 mg total) by mouth every evening.   levothyroxine (SYNTHROID) 125 MCG tablet Take 1 tablet (125 mcg total) by mouth daily before breakfast.   Magnesium 250 MG TABS Take 250 mg by mouth at bedtime.    Misc Natural Products (SAW PALMETTO) CAPS Take 1 capsule by mouth at bedtime.   montelukast (SINGULAIR) 10 MG tablet TAKE 1 TABLET BY MOUTH AT  BEDTIME   Multiple Vitamin (MULTIVITAMIN WITH MINERALS) TABS tablet Take 1 tablet by mouth every morning.   pantoprazole (PROTONIX) 40 MG tablet Take 1 tablet (40 mg total) by mouth 2 (two) times daily.   silodosin (RAPAFLO) 8 MG CAPS capsule Take 1 capsule (8 mg total) by mouth at bedtime. TAKE ONE CAPSULE BY MOUTH DAILY AT BEDTIME (Patient taking differently: Take 8 mg by mouth at bedtime.)   clidinium-chlordiazePOXIDE (LIBRAX) 5-2.5 MG capsule Take 1 capsule by mouth 3 (three) times daily before meals. (Patient not taking: Reported on 09/16/2021)   meclizine (ANTIVERT) 25 MG tablet Take 25 mg by mouth 2 (two) times daily as needed for dizziness. (Patient not taking: Reported on 09/16/2021)   methocarbamol (ROBAXIN) 500 MG tablet Take 500 mg by mouth every 6 (six) hours as needed for muscle spasms. (Patient not taking: Reported on 09/16/2021)   No facility-administered encounter medications on file as of 09/16/2021.     Review of Systems  Review of Systems  N/a Physical Exam  BP 140/62 (BP Location: Right Arm, Cuff Size: Normal)   Pulse 70   Ht 5\' 10"  (1.778 m)   Wt 187 lb 6.4 oz (85 kg)   SpO2 98%   BMI 26.89 kg/m   Wt Readings from Last 5 Encounters:  09/16/21 187 lb 6.4 oz (85 kg)  09/04/21 187 lb 3.2 oz (84.9 kg)  08/27/21 190 lb 6.4 oz (86.4 kg)  08/22/21 182 lb (82.6 kg)  08/20/21 191 lb (86.6 kg)    BMI Readings from Last 5 Encounters:  09/16/21 26.89 kg/m   09/04/21 26.86 kg/m  08/27/21 27.32 kg/m  08/22/21 26.11 kg/m  08/20/21 27.41 kg/m     Physical Exam General: Well-appearing, in no acute distress  Eyes: EOMI, no icterus Neck: Supple no JVP Cardiovascular: Regular in rhythm, no murmurs Pulmonary: Clear to auscultation bilaterally, no wheezing Abdomen: Nondistended, bowel sounds present MSK: No synovitis, no joint effusion Neuro: Normal gait, no weakness Psych: Normal mood, full affect   Assessment & Plan:   Chronic Cough: Likely multifactorial. Improved with Dulera in past although more recently Breo did not help. Begs question of cough variant asthma.  Did not improve with high-dose Dulera or low-dose gabapentin.  Would consider increasing gabapentin the future although he did have presyncope with higher doses in the past.    DOE: Likely multifactorial.  Worsening in the summer 2022 related to anemia, hemoglobin found to be in 60s.  Likely chronic GI losses in the setting of his AVM.  On iron supplementation and improving.  Likely some contribution from mild but stable signs of interstitial lung disease on CT chest.  No CT high-res to further evaluate.  Not worsening.  PFTs consistent with very mild restriction, DLCO reduced and synovitis as well as large AVM.  GERD: Ok but with breakthrough symptoms. Has had manometry, pH probe but 10+ years in past. Consider re-engagement with GI for repeat work up as suspect is contributor to cough.  Pulmonary AVMs: Repeat CT summer 2022 showed stable AVMs.   Return in about 6 months (around 03/16/2022).   Lanier Clam, MD 09/16/2021  I spent 35 minutes in care of this patient including face-to-face visit, review of records, coordination of care.

## 2021-09-16 NOTE — Telephone Encounter (Signed)
Patient is requesting to have his blood checked.. says he recently had to be given a blood transfusion due to his blood being too low  Wants provider to put order in to have this done  Please advise patient when orders have been put in

## 2021-09-16 NOTE — Patient Instructions (Addendum)
Nice to see you again  Glad the breathing is improving as your blood counts are improving.  The CT scan was stable.  Your breathing test are reassuring, just a little abnormal as we discussed.  Please call if you notice any change in your breathing in the future.  Return to clinic in 6 months or sooner as needed with Dr. Silas Flood

## 2021-09-16 NOTE — Telephone Encounter (Signed)
Spoke to pt's wife, LVM stating pt would need an OV with PCP to discuss and follow up. Last OV with PCP was May 2022.  She stated that she would have pt call when he gets home to schedule.

## 2021-10-05 ENCOUNTER — Telehealth: Payer: Self-pay | Admitting: *Deleted

## 2021-10-05 NOTE — Telephone Encounter (Signed)
Patient requesting his December labs to be drawn sooner. Being scheduled for TURP per Dr. Lawerance Bach and he wants to be sure his counts are OK. Per Dr. Benay Spice: OK to do labs early. Scheduling message sent.

## 2021-10-07 ENCOUNTER — Other Ambulatory Visit: Payer: Self-pay

## 2021-10-07 ENCOUNTER — Inpatient Hospital Stay: Payer: Medicare Other | Attending: Nurse Practitioner

## 2021-10-07 DIAGNOSIS — D500 Iron deficiency anemia secondary to blood loss (chronic): Secondary | ICD-10-CM

## 2021-10-07 DIAGNOSIS — D509 Iron deficiency anemia, unspecified: Secondary | ICD-10-CM | POA: Diagnosis not present

## 2021-10-07 DIAGNOSIS — I780 Hereditary hemorrhagic telangiectasia: Secondary | ICD-10-CM

## 2021-10-07 LAB — CBC WITH DIFFERENTIAL (CANCER CENTER ONLY)
Abs Immature Granulocytes: 0.01 10*3/uL (ref 0.00–0.07)
Basophils Absolute: 0.1 10*3/uL (ref 0.0–0.1)
Basophils Relative: 1 %
Eosinophils Absolute: 0.1 10*3/uL (ref 0.0–0.5)
Eosinophils Relative: 2 %
HCT: 44.1 % (ref 39.0–52.0)
Hemoglobin: 14.3 g/dL (ref 13.0–17.0)
Immature Granulocytes: 0 %
Lymphocytes Relative: 33 %
Lymphs Abs: 2 10*3/uL (ref 0.7–4.0)
MCH: 26.9 pg (ref 26.0–34.0)
MCHC: 32.4 g/dL (ref 30.0–36.0)
MCV: 83.1 fL (ref 80.0–100.0)
Monocytes Absolute: 0.7 10*3/uL (ref 0.1–1.0)
Monocytes Relative: 11 %
Neutro Abs: 3.3 10*3/uL (ref 1.7–7.7)
Neutrophils Relative %: 53 %
Platelet Count: 305 10*3/uL (ref 150–400)
RBC: 5.31 MIL/uL (ref 4.22–5.81)
WBC Count: 6.2 10*3/uL (ref 4.0–10.5)
WBC Morphology: ABNORMAL
nRBC: 0 % (ref 0.0–0.2)

## 2021-10-07 LAB — FERRITIN: Ferritin: 27 ng/mL (ref 24–336)

## 2021-10-09 ENCOUNTER — Telehealth: Payer: Self-pay

## 2021-10-09 NOTE — Telephone Encounter (Signed)
-----   Message from Owens Shark, NP sent at 10/08/2021  4:11 PM EDT ----- Please let him know the hemoglobin has corrected into normal range, but ferritin is drifting down.  Is he still taking oral iron?  Repeat CBC and ferritin in 3 weeks.

## 2021-10-09 NOTE — Telephone Encounter (Signed)
Called spoke with pt made him aware of most recent lab ferritin trending down asked if he was taking oral iron pt states he is and reminded pt to try not to miss a dose also made him aware scheduling will be calling to schedule labs per provider x 3weeks

## 2021-10-16 ENCOUNTER — Other Ambulatory Visit: Payer: Self-pay | Admitting: Internal Medicine

## 2021-10-16 DIAGNOSIS — K219 Gastro-esophageal reflux disease without esophagitis: Secondary | ICD-10-CM

## 2021-10-21 ENCOUNTER — Other Ambulatory Visit (HOSPITAL_BASED_OUTPATIENT_CLINIC_OR_DEPARTMENT_OTHER): Payer: Self-pay

## 2021-10-21 ENCOUNTER — Ambulatory Visit: Payer: Medicare Other | Attending: Internal Medicine

## 2021-10-21 DIAGNOSIS — Z23 Encounter for immunization: Secondary | ICD-10-CM

## 2021-10-21 MED ORDER — PFIZER COVID-19 VAC BIVALENT 30 MCG/0.3ML IM SUSP
INTRAMUSCULAR | 0 refills | Status: DC
  Filled 2021-10-21: qty 0.3, 1d supply, fill #0

## 2021-10-21 NOTE — Progress Notes (Signed)
   Covid-19 Vaccination Clinic  Name:  Gregory [name]    MRN: [id number] DOB: 12-29-1940  10/21/2021  [name] was observed post Covid-19 immunization for 15 minutes without incident. He was provided with Vaccine Information Sheet and instruction to access the V-Safe system.   [name] was instructed to call 911 with any severe reactions post vaccine: Difficulty breathing  Swelling of face and throat  A fast heartbeat  A bad rash all over body  Dizziness and weakness   Immunizations Administered     Name Date Dose VIS Date Route   Pfizer Covid-19 Vaccine Bivalent Booster 10/21/2021 12:14 PM 0.3 mL 08/19/2021 Intramuscular   Manufacturer: [place]   Lot: [id number]   [place]: [phone]

## 2021-10-21 NOTE — Progress Notes (Signed)
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    Admitted 5/16 with headache and right lower extremity weakness. CT of the head demonstrated a brain mass measuring 3 x 2 cm with midline shift and edema.  This was a ring enhancing mass in the left basal ganglia.  He underwent stereotactic biopsy by neurosurgery. He was diagnosed with a brain abscess.  Notes from ID indicate his cultures grew out Peptostreptococcus sp.  Patient was followed by infectious disease in the hospital and placed on vancomycin, Rocephin and Flagyl.  He was also placed on Keppra for seizure prophylaxis. 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    Following DC, patient had an episode of elevated heart rate. EMS was called and he was noted to be in atrial flutter. This converted to sinus rhythm spontaneously. Patient placed on toprol. Event monitor showed slow atrial flutter versus ectopic atrial tachycardia.    Monitor July 2021 showed sinus bradycardia, normal sinus rhythm and sinus tachycardia.  Patient did have symptoms of dizziness with monitor in place.  Chest CT December 2021 showed 5.1 cm pulmonary AVM, 0.5 cm pulmonary AVM in the posterior left lower lobe, nonspecific subpleural reticulation throughout both lungs.  Echocardiogram repeated June 2022 and showed normal LV function, normal diastolic function.  Chest CT August 2022 showed stable right lower lobe pulmonary AVM.  Recently complained of dyspnea.  Hemoglobin found to be 6.8 and he was transfused and placed on iron with improvement.  Since he was last seen his dyspnea on exertion has improved.  He denies orthopnea, PND, pedal edema, chest pain or syncope.  Current Outpatient Medications  Medication Sig Dispense Refill   acetaminophen (TYLENOL) 650 MG CR tablet Take 1,300 mg by mouth 2 (two) times  daily.     amoxicillin (AMOXIL) 500 MG capsule SMARTSIG:4 Capsule(s) By Mouth PRN 4 capsule 3   Cholecalciferol 125 MCG (5000 UT) capsule Take 5,000 Units by mouth daily.     clidinium-chlordiazePOXIDE (LIBRAX) 5-2.5 MG capsule Take 1 capsule by mouth 3 (three) times daily before meals. 90 capsule 3   COVID-19 mRNA bivalent vaccine, Pfizer, (PFIZER COVID-19 VAC BIVALENT) injection Inject into the muscle. 0.3 mL 0   Dextromethorphan-guaiFENesin (ROBITUSSIN DM PO) Take 1 capsule by mouth 2 (two) times daily. Chronic cough     gabapentin (NEURONTIN) 100 MG capsule Take 1 capsule (100 mg total) by mouth 2 (two) times daily. (Patient taking differently: Take 200 mg by mouth 2 (two) times daily.) 180 capsule 1   Iron, Ferrous Sulfate, 325 (65 Fe) MG TABS Take 325 mg by mouth in the morning, at noon, and at bedtime. 30 tablet 2   levocetirizine (XYZAL) 5 MG tablet Take 1 tablet (5 mg total) by mouth every evening. 90 tablet 1   levothyroxine (SYNTHROID) 125 MCG tablet Take 1 tablet (125 mcg total) by mouth daily before breakfast. 90 tablet 1   Magnesium 250 MG TABS Take 250 mg by mouth at bedtime.      meclizine (ANTIVERT) 25 MG tablet Take 25 mg by mouth 2 (two) times daily as needed for dizziness.     methocarbamol (ROBAXIN) 500 MG tablet Take 1 tablet (500 mg total) by mouth every 8 (eight) hours as needed for muscle spasms. 270 tablet 0   Misc Natural Products (SAW PALMETTO) CAPS Take 1 capsule by mouth at bedtime.  montelukast (SINGULAIR) 10 MG tablet TAKE 1 TABLET BY MOUTH AT  BEDTIME 90 tablet 1   Multiple Vitamin (MULTIVITAMIN WITH MINERALS) TABS tablet Take 1 tablet by mouth every morning.     pantoprazole (PROTONIX) 40 MG tablet TAKE 1 TABLET BY MOUTH  TWICE DAILY 180 tablet 0   silodosin (RAPAFLO) 8 MG CAPS capsule Take 1 capsule (8 mg total) by mouth at bedtime. TAKE ONE CAPSULE BY MOUTH DAILY AT BEDTIME 90 capsule 1   No current facility-administered medications for this visit.      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   History of nonmelanoma skin cancer EXCISION SQUAMOUS CELL FROM HAND   Hypertrophy of prostate with urinary obstruction and other lower urinary tract symptoms (LUTS)    Irritable bowel syndrome    Lumbago    Other diseases of nasal cavity and sinuses(478.19)    Persistent disorder of initiating or maintaining sleep    Plantar fascial fibromatosis    Pure hypercholesterolemia    Telangiectasia, hereditary hemorrhagic, of Rendu, 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)   SKIN, LIPS, NASAL W/ PREVIOUS NOSE BLEEDS  AMD GI TELANGIECTASIA   Unspecified hypothyroidism     Past Surgical History:  Procedure Laterality Date   APPLICATION OF CRANIAL NAVIGATION N/A 05/16/2015   Procedure: APPLICATION OF CRANIAL NAVIGATION;  Surgeon: Consuella Lose, MD;  Location: [facility];  Service: Neurosurgery;  Laterality: N/A;   BRAIN BIOPSY Left 05/16/2015   Procedure: Stereotactic Left Brain Biopsy with Brain Lab;  Surgeon: Consuella Lose, MD;  Location: [place] [facility];  Service: Neurosurgery;  Laterality: Left;  Stereotactic Left Brain biopsy with brainlab   BRAIN SURGERY     CARDIOVASCULAR STRESS TEST  01-14-2003   NO ISCHEMIA / EF 61%/ NORMAL LE WALL MOTION   EXCISION LEFT WRIST GANGLIAN/ MYXOID CYST  05-30-2009   IR RADIOLOGIST EVAL & MGMT  02/01/2019   NASAL SINUS SURGERY     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 Renda Rolls   TRANSTHORACIC ECHOCARDIOGRAM  10-17-2008   DR Jannifer Fischler   NORMAL LVF/ EF 60%/  MILDLY DILATED RIGHT ATRIUM/ VENTRICULE   VARICOCELECTOMY  1991   Dr. Tresa Endo    Social  History   Socioeconomic History   Marital status: Married    Spouse name: [name]   Number of children: 2   Years of education: Not on file   Highest education level: Master's degree (e.g., MA, MS, MEng, MEd, MSW, MBA)  Occupational History    Comment: Consultant  Tobacco Use   Smoking status: Never   Smokeless tobacco: Never  Vaping Use   Vaping Use: Never used  Substance and Sexual Activity   Alcohol use: No    Alcohol/week: 0.0 standard drinks   Drug use: No   Sexual activity: Not on file  Other Topics Concern   Not on file  Social History Narrative   Lives with wife in a one story home.  Has 2 sons.  Retired Veterinary surgeon.  Education: Masters degree.      05/09/19- Pt states that his balance is worse  since last visit. He has not fallen but has come close on a few occassions. The weakness on his right side is the same, he states.      Right Handed   Drinks caffeine    Social Determinants of Health   Financial Resource Strain: Low Risk    Difficulty of Paying Living Expenses: Not hard at all  Food Insecurity: Not on file  Transportation Needs: Not on file  Physical Activity: Not on file  Stress: Not on file  Social Connections: Not on file  Intimate Partner Violence: Not on file    Family History  Problem Relation Age of Onset   Diabetes Mother    Hypertension Mother    Pancreatic cancer Mother    Stroke Mother    Kidney failure Father    Hypertension Sister    Healthy Son     ROS: no fevers or chills, productive cough, hemoptysis, dysphasia, odynophagia, melena, hematochezia, dysuria, hematuria, rash, seizure activity, orthopnea, PND, pedal edema, claudication. Remaining systems are negative.  Physical Exam: Well-developed well-nourished in no acute distress.  Skin is warm and dry.  HEENT is normal.  Neck is supple.  Chest is clear to auscultation with normal expansion.  Cardiovascular exam is regular rate and rhythm.  Abdominal exam nontender or  distended. No masses palpated. Extremities show no edema. neuro grossly intact  A/P  1 dyspnea-this has resolved following transfusion and correction of his anemia.  2 anemia-follow-up primary care.  3 history of atrial flutter versus ectopic atrial tachycardia-he has had no recurrences.  He is not anticoagulated given history of Osler-Weber-Rendu and AV malformation/GI bleeding.  4 orthostasis-continue hydration/increase sodium intake.  His symptoms have resolved after correction of anemia.  5 hyperlipidemia-followed by primary care.  Kirk Ruths, MD

## 2021-10-26 ENCOUNTER — Ambulatory Visit (INDEPENDENT_AMBULATORY_CARE_PROVIDER_SITE_OTHER): Payer: Medicare Other | Admitting: Internal Medicine

## 2021-10-26 ENCOUNTER — Encounter: Payer: Self-pay | Admitting: Internal Medicine

## 2021-10-26 ENCOUNTER — Other Ambulatory Visit: Payer: Self-pay

## 2021-10-26 VITALS — BP 144/82 | HR 69 | Temp 98.1°F | Ht 70.0 in | Wt 186.0 lb

## 2021-10-26 DIAGNOSIS — Z298 Encounter for other specified prophylactic measures: Secondary | ICD-10-CM

## 2021-10-26 DIAGNOSIS — N401 Enlarged prostate with lower urinary tract symptoms: Secondary | ICD-10-CM

## 2021-10-26 DIAGNOSIS — N138 Other obstructive and reflux uropathy: Secondary | ICD-10-CM

## 2021-10-26 DIAGNOSIS — R7303 Prediabetes: Secondary | ICD-10-CM | POA: Diagnosis not present

## 2021-10-26 DIAGNOSIS — E039 Hypothyroidism, unspecified: Secondary | ICD-10-CM | POA: Diagnosis not present

## 2021-10-26 DIAGNOSIS — Z23 Encounter for immunization: Secondary | ICD-10-CM

## 2021-10-26 DIAGNOSIS — G8929 Other chronic pain: Secondary | ICD-10-CM

## 2021-10-26 DIAGNOSIS — M545 Low back pain, unspecified: Secondary | ICD-10-CM | POA: Diagnosis not present

## 2021-10-26 LAB — HEMOGLOBIN A1C: Hgb A1c MFr Bld: 5.2 % (ref 4.6–6.5)

## 2021-10-26 MED ORDER — SHINGRIX 50 MCG/0.5ML IM SUSR: 0.5000 mL | Freq: Once | INTRAMUSCULAR | 1 refills | Status: AC

## 2021-10-26 MED ORDER — METHOCARBAMOL 500 MG PO TABS
500.0000 mg | ORAL_TABLET | Freq: Three times a day (TID) | ORAL | 0 refills | Status: AC | PRN
Start: 2021-10-26 — End: ?

## 2021-10-26 MED ORDER — AMOXICILLIN 500 MG PO CAPS: ORAL_CAPSULE | ORAL | 3 refills | Status: DC

## 2021-10-26 MED ORDER — SILODOSIN 8 MG PO CAPS: 8.0000 mg | ORAL_CAPSULE | Freq: Every day | ORAL | 1 refills | Status: DC

## 2021-10-26 NOTE — Progress Notes (Signed)
Subjective:  Patient ID: Gregory [name], male    DOB: 04-09-1941  Age: 80 y.o. MRN: [id number]  CC: Follow-up  This visit occurred during the SARS-CoV-2 public health emergency.  Safety protocols were in place, including screening questions prior to the visit, additional usage of staff PPE, and extensive cleaning of exam room while observing appropriate contact time as indicated for disinfecting solutions.    HPI Gregory [name] presents for f/up -  Other than some nosebleeds he feels well. He has chronic LBP.  Outpatient Medications Prior to Visit  Medication Sig Dispense Refill   acetaminophen (TYLENOL) 650 MG CR tablet Take 1,300 mg by mouth 2 (two) times daily.     Cholecalciferol 125 MCG (5000 UT) capsule Take 5,000 Units by mouth daily.     clidinium-chlordiazePOXIDE (LIBRAX) 5-2.5 MG capsule Take 1 capsule by mouth 3 (three) times daily before meals. 90 capsule 3   COVID-19 mRNA bivalent vaccine, Pfizer, (PFIZER COVID-19 VAC BIVALENT) injection Inject into the muscle. 0.3 mL 0   Dextromethorphan-guaiFENesin (ROBITUSSIN DM PO) Take 1 capsule by mouth 2 (two) times daily. Chronic cough     gabapentin (NEURONTIN) 100 MG capsule Take 1 capsule (100 mg total) by mouth 2 (two) times daily. (Patient taking differently: Take 200 mg by mouth 2 (two) times daily.) 180 capsule 1   Iron, Ferrous Sulfate, 325 (65 Fe) MG TABS Take 325 mg by mouth in the morning, at noon, and at bedtime. 30 tablet 2   levocetirizine (XYZAL) 5 MG tablet Take 1 tablet (5 mg total) by mouth every evening. 90 tablet 1   levothyroxine (SYNTHROID) 125 MCG tablet Take 1 tablet (125 mcg total) by mouth daily before breakfast. 90 tablet 1   Magnesium 250 MG TABS Take 250 mg by mouth at bedtime.      meclizine (ANTIVERT) 25 MG tablet Take 25 mg by mouth 2 (two) times daily as needed for dizziness.     Misc Natural Products (SAW PALMETTO) CAPS Take 1 capsule by mouth at bedtime.     montelukast (SINGULAIR) 10 MG  tablet TAKE 1 TABLET BY MOUTH AT  BEDTIME 90 tablet 1   Multiple Vitamin (MULTIVITAMIN WITH MINERALS) TABS tablet Take 1 tablet by mouth every morning.     pantoprazole (PROTONIX) 40 MG tablet TAKE 1 TABLET BY MOUTH  TWICE DAILY 180 tablet 0   amoxicillin (AMOXIL) 500 MG capsule SMARTSIG:4 Capsule(s) By Mouth PRN (Patient taking differently: Take 2,000 mg by mouth See admin instructions. Take 4 capsules (2000 mg) by mouth one hour prior to dental appointments) 4 capsule 3   methocarbamol (ROBAXIN) 500 MG tablet Take 500 mg by mouth every 6 (six) hours as needed for muscle spasms.     silodosin (RAPAFLO) 8 MG CAPS capsule Take 1 capsule (8 mg total) by mouth at bedtime. TAKE ONE CAPSULE BY MOUTH DAILY AT BEDTIME (Patient taking differently: Take 8 mg by mouth at bedtime.) 90 capsule 1   No facility-administered medications prior to visit.    ROS Review of Systems  Constitutional:  Negative for diaphoresis and fatigue.  HENT:  Positive for nosebleeds.   Eyes: Negative.   Respiratory:  Negative for cough, chest tightness, shortness of breath and wheezing.   Cardiovascular:  Negative for chest pain, palpitations and leg swelling.  Gastrointestinal:  Negative for abdominal pain, blood in stool, constipation, diarrhea, nausea and vomiting.  Genitourinary: Negative.  Negative for difficulty urinating and hematuria.  Musculoskeletal:  Positive for back pain. Negative  for arthralgias.  Skin: Negative.   Neurological: Negative.  Negative for dizziness, weakness, light-headedness and numbness.  Hematological:  Negative for adenopathy. Bruises/bleeds easily.  Psychiatric/Behavioral: Negative.     Objective:  BP (!) 144/82 (BP Location: Right Arm, Patient Position: Sitting, Cuff Size: Large)   Pulse 69   Temp 98.1 F (36.7 C) (Oral)   Ht 5\' 10"  (1.778 m)   Wt 186 lb (84.4 kg)   SpO2 91%   BMI 26.69 kg/m   BP Readings from Last 3 Encounters:  10/26/21 (!) 144/82  09/16/21 140/62  09/04/21  (!) 142/61    Wt Readings from Last 3 Encounters:  10/26/21 186 lb (84.4 kg)  09/16/21 187 lb 6.4 oz (85 kg)  09/04/21 187 lb 3.2 oz (84.9 kg)    Physical Exam Vitals reviewed.  HENT:     Nose: Nose normal.     Mouth/Throat:     Mouth: Mucous membranes are moist.  Eyes:     Conjunctiva/sclera: Conjunctivae normal.  Cardiovascular:     Rate and Rhythm: Normal rate and regular rhythm.     Heart sounds: No murmur heard. Pulmonary:     Effort: Pulmonary effort is normal.     Breath sounds: No stridor. No wheezing, rhonchi or rales.  Abdominal:     General: Abdomen is flat. Bowel sounds are normal. There is no distension.     Palpations: Abdomen is soft. There is no hepatomegaly, splenomegaly or mass.     Tenderness: There is no abdominal tenderness.  Musculoskeletal:        General: Normal range of motion.     Cervical back: Neck supple.     Right lower leg: No edema.     Left lower leg: No edema.  Lymphadenopathy:     Cervical: No cervical adenopathy.  Skin:    General: Skin is warm and dry.     Coloration: Skin is not pale.  Neurological:     General: No focal deficit present.     Mental Status: He is alert.    Lab Results  Component Value Date   WBC 6.2 10/07/2021   HGB 14.3 10/07/2021   HCT 44.1 10/07/2021   PLT 305 10/07/2021   GLUCOSE 97 08/23/2021   CHOL 140 10/23/2020   TRIG 62.0 10/23/2020   HDL 46.30 10/23/2020   LDLDIRECT 139.4 06/15/2007   LDLCALC 81 10/23/2020   ALT 13 08/23/2021   AST 16 08/23/2021   NA 132 (L) 08/23/2021   K 4.4 08/23/2021   CL 101 08/23/2021   CREATININE 0.86 08/23/2021   BUN 13 08/23/2021   CO2 24 08/23/2021   TSH 1.95 10/26/2021   PSA 1.77 04/19/2018   INR 1.16 05/13/2015   HGBA1C 5.2 10/26/2021    No results found.  Assessment & Plan:   [name] was seen today for follow-up.  Diagnoses and all orders for this visit:  Prediabetes- His A1C is down to 5.2%. -     Hemoglobin A1c; Future -     Hemoglobin  A1c  Benign prostatic hyperplasia with urinary obstruction- He is doing well on the alpha-blocker. -     silodosin (RAPAFLO) 8 MG CAPS capsule; Take 1 capsule (8 mg total) by mouth at bedtime. TAKE ONE CAPSULE BY MOUTH DAILY AT BEDTIME  Acquired hypothyroidism- His TSH is on the normal range. -     TSH; Future -     TSH  SBE (subacute bacterial endocarditis) prophylaxis candidate -     amoxicillin (  AMOXIL) 500 MG capsule; SMARTSIG:4 Capsule(s) By Mouth PRN  Chronic midline low back pain without sciatica -     methocarbamol (ROBAXIN) 500 MG tablet; Take 1 tablet (500 mg total) by mouth every 8 (eight) hours as needed for muscle spasms.  Need for shingles vaccine -     Zoster Vaccine Adjuvanted [facility]) injection; Inject 0.5 mLs into the muscle once for 1 dose.  I have changed [name] "[name]"'s methocarbamol. I am also having him start on Shingrix. Additionally, I am having him maintain his Magnesium, levocetirizine, meclizine, clidinium-chlordiazePOXIDE, Dextromethorphan-guaiFENesin (ROBITUSSIN DM PO), montelukast, levothyroxine, gabapentin, acetaminophen, multivitamin with minerals, Saw Palmetto, Iron (Ferrous Sulfate), Cholecalciferol, pantoprazole, Pfizer COVID-19 Vac Bivalent, silodosin, and amoxicillin.  Meds ordered this encounter  Medications   silodosin (RAPAFLO) 8 MG CAPS capsule    Sig: Take 1 capsule (8 mg total) by mouth at bedtime. TAKE ONE CAPSULE BY MOUTH DAILY AT BEDTIME    Dispense:  90 capsule    Refill:  1   amoxicillin (AMOXIL) 500 MG capsule    Sig: SMARTSIG:4 Capsule(s) By Mouth PRN    Dispense:  4 capsule    Refill:  3   methocarbamol (ROBAXIN) 500 MG tablet    Sig: Take 1 tablet (500 mg total) by mouth every 8 (eight) hours as needed for muscle spasms.    Dispense:  270 tablet    Refill:  0   Zoster Vaccine Adjuvanted [facility]) injection    Sig: Inject 0.5 mLs into the muscle once for 1 dose.    Dispense:  0.5 mL    Refill:  1     Follow-up:  Return in about 6 months (around 04/25/2022).  Scarlette Calico, MD

## 2021-10-27 LAB — TSH: TSH: 1.95 u[IU]/mL (ref 0.35–5.50)

## 2021-10-30 ENCOUNTER — Encounter: Payer: Self-pay | Admitting: Cardiology

## 2021-10-30 ENCOUNTER — Other Ambulatory Visit: Payer: Self-pay

## 2021-10-30 ENCOUNTER — Ambulatory Visit (INDEPENDENT_AMBULATORY_CARE_PROVIDER_SITE_OTHER): Payer: Medicare Other | Admitting: Cardiology

## 2021-10-30 VITALS — BP 136/68 | HR 74 | Ht 70.0 in | Wt 186.0 lb

## 2021-10-30 DIAGNOSIS — I483 Typical atrial flutter: Secondary | ICD-10-CM | POA: Diagnosis not present

## 2021-10-30 DIAGNOSIS — I951 Orthostatic hypotension: Secondary | ICD-10-CM

## 2021-10-30 DIAGNOSIS — R0609 Other forms of dyspnea: Secondary | ICD-10-CM

## 2021-10-30 NOTE — Patient Instructions (Signed)

## 2021-11-25 ENCOUNTER — Other Ambulatory Visit: Payer: Self-pay | Admitting: *Deleted

## 2021-11-25 DIAGNOSIS — D500 Iron deficiency anemia secondary to blood loss (chronic): Secondary | ICD-10-CM

## 2021-11-26 ENCOUNTER — Other Ambulatory Visit: Payer: Self-pay

## 2021-11-26 ENCOUNTER — Inpatient Hospital Stay: Payer: Medicare Other | Attending: Nurse Practitioner | Admitting: Oncology

## 2021-11-26 ENCOUNTER — Inpatient Hospital Stay: Payer: Medicare Other

## 2021-11-26 VITALS — BP 133/60 | HR 81 | Temp 97.8°F | Resp 20 | Ht 70.0 in | Wt 187.8 lb

## 2021-11-26 DIAGNOSIS — D509 Iron deficiency anemia, unspecified: Secondary | ICD-10-CM | POA: Insufficient documentation

## 2021-11-26 DIAGNOSIS — D500 Iron deficiency anemia secondary to blood loss (chronic): Secondary | ICD-10-CM | POA: Diagnosis not present

## 2021-11-26 DIAGNOSIS — I780 Hereditary hemorrhagic telangiectasia: Secondary | ICD-10-CM | POA: Diagnosis not present

## 2021-11-26 LAB — CBC WITH DIFFERENTIAL (CANCER CENTER ONLY)
Abs Immature Granulocytes: 0.02 10*3/uL (ref 0.00–0.07)
Basophils Absolute: 0 10*3/uL (ref 0.0–0.1)
Basophils Relative: 1 %
Eosinophils Absolute: 0.1 10*3/uL (ref 0.0–0.5)
Eosinophils Relative: 1 %
HCT: 44.2 % (ref 39.0–52.0)
Hemoglobin: 15.1 g/dL (ref 13.0–17.0)
Immature Granulocytes: 0 %
Lymphocytes Relative: 36 %
Lymphs Abs: 2.2 10*3/uL (ref 0.7–4.0)
MCH: 30.3 pg (ref 26.0–34.0)
MCHC: 34.2 g/dL (ref 30.0–36.0)
MCV: 88.8 fL (ref 80.0–100.0)
Monocytes Absolute: 0.6 10*3/uL (ref 0.1–1.0)
Monocytes Relative: 10 %
Neutro Abs: 3.2 10*3/uL (ref 1.7–7.7)
Neutrophils Relative %: 52 %
Platelet Count: 245 10*3/uL (ref 150–400)
RBC: 4.98 MIL/uL (ref 4.22–5.81)
RDW: 15.7 % — ABNORMAL HIGH (ref 11.5–15.5)
WBC Count: 6.2 10*3/uL (ref 4.0–10.5)
nRBC: 0 % (ref 0.0–0.2)

## 2021-11-26 LAB — FERRITIN: Ferritin: 24 ng/mL (ref 24–336)

## 2021-11-26 NOTE — Progress Notes (Signed)
  [place] OFFICE PROGRESS NOTE   Diagnosis: HHT, iron deficiency  INTERVAL HISTORY:   Gregory [name] returns as scheduled.  He feels well.  He has dark stool.  He is taking iron 3 times per day.  He is tolerating the iron well.  No bleeding.  Objective:  Vital signs in last 24 hours:  Blood pressure 133/60, pulse 81, temperature 97.8 F (36.6 C), temperature source Oral, resp. rate 20, height 5\' 10"  (1.778 m), weight 187 lb 12.8 oz (85.2 kg), SpO2 100 %.    Resp: Lungs clear bilaterally Cardio: Regular rate and rhythm GI: No hepatosplenomegaly Vascular: No leg edema  Skin: Telangiectasias over the head and face   Lab Results:  Lab Results  Component Value Date   WBC 6.2 11/26/2021   HGB 15.1 11/26/2021   HCT 44.2 11/26/2021   MCV 88.8 11/26/2021   PLT 245 11/26/2021   NEUTROABS 3.2 11/26/2021    CMP  Lab Results  Component Value Date   NA 132 (L) 08/23/2021   K 4.4 08/23/2021   CL 101 08/23/2021   CO2 24 08/23/2021   GLUCOSE 97 08/23/2021   BUN 13 08/23/2021   CREATININE 0.86 08/23/2021   CALCIUM 8.9 08/23/2021   PROT 6.5 08/23/2021   ALBUMIN 4.0 08/23/2021   AST 16 08/23/2021   ALT 13 08/23/2021   ALKPHOS 43 08/23/2021   BILITOT 2.0 (H) 08/23/2021   GFRNONAA >60 08/23/2021   GFRAA >60 06/01/2020    Medications: I have reviewed the patient's current medications.   Assessment/Plan: Iron deficiency anemia  08/18/2021 hemoglobin 6.8, MCV 58 08/20/2021 hemoglobin 6.6 MCV 59, ferritin 3 08/22/2021 hemoglobin 6.5, stool negative for occult blood, transfused 2 units of blood, Venofer 200 mg IV 08/23/2021 hemoglobin 8.3 HHT Pulmonary AVM right lower lobe, followed by Dr. 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  The hemoglobin is  normal.  He will continue iron.  We will follow-up on the ferritin level from today.  He will return for lab visit in 2 months and an office visit in 4 months.  Gregory Coder, MD  11/26/2021  9:26 AM

## 2021-11-27 ENCOUNTER — Telehealth: Payer: Medicare Other

## 2021-12-03 ENCOUNTER — Ambulatory Visit: Payer: Medicare Other | Admitting: Cardiology

## 2021-12-08 ENCOUNTER — Encounter (HOSPITAL_BASED_OUTPATIENT_CLINIC_OR_DEPARTMENT_OTHER): Payer: Self-pay | Admitting: Nurse Practitioner

## 2021-12-08 ENCOUNTER — Other Ambulatory Visit: Payer: Self-pay

## 2021-12-08 ENCOUNTER — Ambulatory Visit (INDEPENDENT_AMBULATORY_CARE_PROVIDER_SITE_OTHER): Payer: Medicare Other | Admitting: Nurse Practitioner

## 2021-12-08 VITALS — BP 122/82 | HR 72 | Ht 70.0 in | Wt 188.0 lb

## 2021-12-08 DIAGNOSIS — D649 Anemia, unspecified: Secondary | ICD-10-CM | POA: Diagnosis not present

## 2021-12-08 DIAGNOSIS — E039 Hypothyroidism, unspecified: Secondary | ICD-10-CM

## 2021-12-08 DIAGNOSIS — I780 Hereditary hemorrhagic telangiectasia: Secondary | ICD-10-CM | POA: Diagnosis not present

## 2021-12-08 DIAGNOSIS — I700 Atherosclerosis of aorta: Secondary | ICD-10-CM

## 2021-12-08 DIAGNOSIS — Q2572 Congenital pulmonary arteriovenous malformation: Secondary | ICD-10-CM | POA: Diagnosis not present

## 2021-12-08 DIAGNOSIS — M159 Polyosteoarthritis, unspecified: Secondary | ICD-10-CM

## 2021-12-08 DIAGNOSIS — Z0000 Encounter for general adult medical examination without abnormal findings: Secondary | ICD-10-CM

## 2021-12-08 NOTE — Progress Notes (Signed)
[name], DNP, AGNP-c Primary Care [facility] Sports Medicine [address]   [place],  [address] [phone]  New patient visit   Patient: Gregory [name]   DOB: 1941-01-29   80 y.o. Male  MRN: [id number] Visit Date: 12/08/2021  Patient Care Team: Foster Frericks, [name], NP as PCP - General (Nurse Practitioner) Lelon Perla, MD as PCP - Cardiology (Cardiology) Alda Berthold, DO as Consulting Physician (Neurology) Charlton Haws, [facility] as Pharmacist (Pharmacist)  Today's healthcare provider: [name], NP   Chief Complaint  Patient presents with   New Patient (Initial Visit)    Patient is here to establish care, he would like to discuss brittle nails. No refills are needed today.    Subjective    HPI  Gregory [name] is a 80 y.o. male who presents today as a new patient to establish care.    Gregory [name] has concerns today with left hip and shoulder pain, recent anemia, brittle nails, and upcoming prostate surgery.  Anemia He reports hemoglobin trending down over about a year that finally culminated in the summer of this year with hemoglobin levels down to 6.  He endorses symptoms of dizziness lightheadedness, shortness of breath, and extreme fatigue.  He was seen by urology, GI, and hematology.  Unfortunately they were unable to determine the cause or location of the blood loss.  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  He underwent a blood transfusion at the cancer center and was started on oral replacement iron.  Since that time his hemoglobin has returned to normal.  He is continuing the iron supplementation and follow-up with Dr. Benay Spice in hematology/oncology to monitor for new or worsening symptoms.  Hip and shoulder pain He endorses chronic left  hip and left shoulder pain both of which she has received previous injections into which have helped with his symptoms. He is seen by Dr. Alma Friendly for this.  He would like [place] to make a referral for him today so he can have evaluation for possible repeat injections.  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  Brittle nails He endorses brittle nails with ridges that are frequently splitting and breaking.  He reports this is a fairly new occurrence within the last 8 months to a year.  He has been using nail strengthener however it does not seem to be helping.  Nothing like this is ever happened before.  Prostate surgery He is will be having planned prostate surgery with Dr. Rosana Hoes at [facility] in the near future.  The surgery was placed on hold when the anemia was discovered.  He is not having any new or concerning symptoms at this time.  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  He does not have any concerning symptoms with this today.  He has a history of hypothyroidism for which he takes levothyroxine daily.  His last labs were fairly recently and were within the normal range.  He is having no associated signs or symptoms today.    Past Medical History:  Diagnosis Date   Anxiety state, unspecified    Arthritis    BACK PAIN,  LUMBAR 02/08/2008   Qualifier: Diagnosis of  By: Julien Girt CMA, Leigh     Benign neoplasm of colon    Bilateral leg edema 03/19/2021   GERD (gastroesophageal reflux disease)    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  WITHOUT HEMOPTYSIS   History of nonmelanoma skin cancer EXCISION SQUAMOUS CELL FROM HAND   Hypertrophy of prostate with urinary obstruction and other lower urinary tract symptoms (LUTS)    Irritable bowel syndrome    Lumbago    Other diseases of nasal cavity and sinuses(478.19)    Persistent disorder of  initiating or maintaining sleep    Plantar fascial fibromatosis    Pure hypercholesterolemia    Telangiectasia, hereditary hemorrhagic, of Rendu, 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)   SKIN, LIPS, NASAL W/ PREVIOUS NOSE BLEEDS  AMD GI TELANGIECTASIA   Unspecified hypothyroidism    Past Surgical History:  Procedure Laterality Date   APPLICATION OF CRANIAL NAVIGATION N/A 05/16/2015   Procedure: APPLICATION OF CRANIAL NAVIGATION;  Surgeon: Consuella Lose, MD;  Location: [facility];  Service: Neurosurgery;  Laterality: N/A;   BRAIN BIOPSY Left 05/16/2015   Procedure: Stereotactic Left Brain Biopsy with Brain Lab;  Surgeon: Consuella Lose, MD;  Location: [facility];  Service: Neurosurgery;  Laterality: Left;  Stereotactic Left Brain biopsy with brainlab   BRAIN SURGERY     CARDIOVASCULAR STRESS TEST  01-14-2003   NO ISCHEMIA / EF 61%/ NORMAL LE WALL MOTION   EXCISION LEFT WRIST GANGLIAN/ MYXOID CYST  05-30-2009   IR RADIOLOGIST EVAL & MGMT  02/01/2019   NASAL SINUS SURGERY     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 Renda Rolls   TRANSTHORACIC ECHOCARDIOGRAM  10-17-2008   DR CRENSHAW   NORMAL LVF/ EF 60%/  MILDLY DILATED RIGHT ATRIUM/ VENTRICULE   VARICOCELECTOMY  1991   Dr. Tresa Endo   Family Status  Relation Name Status   Mother  Deceased   Father  Deceased at age 82   Sister  34   Son  (Not Specified)   Family History  Problem Relation Age of Onset   Diabetes Mother    Hypertension Mother    Pancreatic cancer Mother    Stroke Mother    Kidney failure Father    Hypertension Sister    Healthy Son    Social History   Socioeconomic History   Marital status: Married    Spouse name: Ann   Number of children: 2   Years of education: Not on file   Highest education level: Master's degree (e.g., MA, MS,  MEng, MEd, MSW, MBA)  Occupational History    Comment: Consultant  Tobacco Use   Smoking status: Never   Smokeless tobacco: Never  Vaping Use   Vaping Use: Never used  Substance and Sexual Activity   Alcohol use: No    Alcohol/week: 0.0 standard drinks   Drug use: No   Sexual activity: Not on file  Other Topics Concern   Not on file  Social History Narrative   Lives with wife in a one story home.  Has 2 sons.  Retired Veterinary surgeon.  Education: Masters degree.      05/09/19- Pt states that his balance is worse since last visit. He has not fallen but has come close on a  few occassions. The weakness on his right side is the same, he states.      Right Handed   Drinks caffeine    Social Determinants of Health   Financial Resource Strain: Not on file  Food Insecurity: Not on file  Transportation Needs: Not on file  Physical Activity: Not on file  Stress: Not on file  Social Connections: Not on file   Outpatient Medications Prior to Visit  Medication Sig   acetaminophen (TYLENOL) 650 MG CR tablet Take 1,300 mg by mouth 2 (two) times daily.   amoxicillin (AMOXIL) 500 MG capsule SMARTSIG:4 Capsule(s) By Mouth PRN   Cholecalciferol 125 MCG (5000 UT) capsule Take 5,000 Units by mouth daily.   clidinium-chlordiazePOXIDE (LIBRAX) 5-2.5 MG capsule Take 1 capsule by mouth 3 (three) times daily before meals.   gabapentin (NEURONTIN) 100 MG capsule Take 1 capsule (100 mg total) by mouth 2 (two) times daily. (Patient taking differently: Take 200 mg by mouth 2 (two) times daily.)   Iron, Ferrous Sulfate, 325 (65 Fe) MG TABS Take 325 mg by mouth in the morning, at noon, and at bedtime.   levocetirizine (XYZAL) 5 MG tablet Take 1 tablet (5 mg total) by mouth every evening.   levothyroxine (SYNTHROID) 125 MCG tablet Take 1 tablet (125 mcg total) by mouth daily before breakfast.   Magnesium 250 MG TABS Take 250 mg by mouth at bedtime.    meclizine (ANTIVERT) 25 MG tablet Take 25 mg by mouth  2 (two) times daily as needed for dizziness.   methocarbamol (ROBAXIN) 500 MG tablet Take 1 tablet (500 mg total) by mouth every 8 (eight) hours as needed for muscle spasms.   Misc Natural Products (SAW PALMETTO) CAPS Take 1 capsule by mouth at bedtime.   montelukast (SINGULAIR) 10 MG tablet TAKE 1 TABLET BY MOUTH AT  BEDTIME   Multiple Vitamin (MULTIVITAMIN WITH MINERALS) TABS tablet Take 1 tablet by mouth every morning.   pantoprazole (PROTONIX) 40 MG tablet TAKE 1 TABLET BY MOUTH  TWICE DAILY   silodosin (RAPAFLO) 8 MG CAPS capsule Take 1 capsule (8 mg total) by mouth at bedtime. TAKE ONE CAPSULE BY MOUTH DAILY AT BEDTIME   [DISCONTINUED] COVID-19 mRNA bivalent vaccine, Pfizer, (PFIZER COVID-19 VAC BIVALENT) injection Inject into the muscle.   [DISCONTINUED] Dextromethorphan-guaiFENesin (ROBITUSSIN DM PO) Take 1 capsule by mouth 2 (two) times daily. Chronic cough   [DISCONTINUED] vitamin B-12 (CYANOCOBALAMIN) 50 MCG tablet Take 50 mcg by mouth daily.   No facility-administered medications prior to visit.   Allergies  Allergen Reactions   Nsaids Other (See Comments)    Cannot take-per MD   Other Other (See Comments)    All blood thinners-cannot take per MD   Aspirin Other (See Comments)    nosebleeds   Statins Other (See Comments)    Weakness, myalgias   Sulfa Antibiotics Other (See Comments)    Unknown reaction   Tizanidine Hcl Other (See Comments)    Patient stated that after taking this medication he experienced feelings of dizziness and disorientation.     Immunization History  Administered Date(s) Administered   Fluad Quad(high Dose 65+) 08/24/2019, 10/02/2020, 09/11/2021   Influenza Split 09/21/2011, 09/27/2012   Influenza Whole 09/11/2008, 09/11/2009, 09/22/2010   Influenza, High Dose Seasonal PF 09/09/2016, 09/16/2017, 10/03/2018   Influenza,inj,Quad PF,6+ Mos 10/04/2013, 10/08/2014, 10/02/2015   PFIZER Comirnaty(Gray Top)Covid-19 Tri-Sucrose Vaccine 06/08/2021    PFIZER(Purple Top)SARS-COV-2 Vaccination 01/03/2020, 01/23/2020, 10/18/2020   Pfizer Covid-19 Vaccine Bivalent Booster 53yrs & up 10/21/2021  Pneumococcal Conjugate-13 12/07/2016   Pneumococcal Polysaccharide-23 03/12/2010, 06/27/2018   Tdap 10/04/2013   Varicella 12/20/2009    Health Maintenance  Topic Date Due   Hepatitis C Screening  Never done   Zoster Vaccines- Shingrix (1 of 2) Never done   TETANUS/TDAP  10/05/2023   COLONOSCOPY (Pts 45-52yrs Insurance coverage will need to be confirmed)  06/29/2026   Pneumonia Vaccine 66+ Years old  Completed   INFLUENZA VACCINE  Completed   COVID-19 Vaccine  Completed   HPV VACCINES  Aged Out    Patient Care Team: Tinlee Navarrette, [name], NP as PCP - General (Nurse Practitioner) Lelon Perla, MD as PCP - Cardiology (Cardiology) Alda Berthold, DO as Consulting Physician (Neurology) Charlton Haws, [facility] as Pharmacist (Pharmacist)  Review of Systems All review of systems negative except what is listed in the HPI   Objective    BP 122/82    Pulse 72    Ht 5\' 10"  (1.778 m)    Wt 188 lb (85.3 kg)    SpO2 92%    BMI 26.98 kg/m  Physical Exam Vitals and nursing note reviewed.  Constitutional:      General: He is not in acute distress.    Appearance: Normal appearance. He is normal weight.  HENT:     Head: Normocephalic.     Right Ear: Tympanic membrane normal.     Left Ear: Tympanic membrane normal.  Eyes:     Extraocular Movements: Extraocular movements intact.     Conjunctiva/sclera: Conjunctivae normal.     Pupils: Pupils are equal, round, and reactive to light.  Neck:     Vascular: No carotid bruit.  Cardiovascular:     Rate and Rhythm: Normal rate and regular rhythm.     Pulses: Normal pulses.     Heart sounds: Normal heart sounds. No murmur heard. Pulmonary:     Effort: Pulmonary effort is normal. No respiratory distress.     Breath sounds: Normal breath sounds. No wheezing.  Abdominal:     General: Abdomen is flat.  Bowel sounds are normal. There is no distension.     Palpations: Abdomen is soft.     Tenderness: There is no abdominal tenderness. There is no guarding.  Musculoskeletal:        General: Normal range of motion.     Cervical back: Normal range of motion. No tenderness.     Right lower leg: No edema.     Left lower leg: No edema.  Lymphadenopathy:     Cervical: No cervical adenopathy.  Skin:    General: Skin is warm and dry.     Capillary Refill: Capillary refill takes less than 2 seconds.  Neurological:     General: No focal deficit present.     Mental Status: He is alert and oriented to person, place, and time.     Motor: No weakness.  Psychiatric:        Mood and Affect: Mood normal.        Behavior: Behavior normal.        Thought Content: Thought content normal.        Judgment: Judgment normal.    Depression Screen PHQ 2/9 Scores 06/09/2020 06/12/2019 06/27/2018 07/21/2015  PHQ - 2 Score 0 0 0 0   No results found for any visits on 12/08/21.  Assessment & Plan      Problem List Items Addressed This Visit     Hypothyroidism    Well-controlled with levothyroxine.  Labs recently with previous PCP reviewed today. We will plan to follow-up with repeat labs in about 6 months or sooner if needed.      OSLER-WEBER-RENDU DISEASE    Longstanding diagnosis with severe symptoms including frequent and high risk for bleeds. Will need to avoid all NSAIDs and blood thinners due to this condition. 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 We will continue to monitor and keep a close eye on labs to ensure that he is no longer experiencing any negative side effects from this condition.      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 This is most likely what is causing the left shoulder and left hip pain.  We will send the referral today to the orthopedist for further  evaluation and recommendations. May use lidocaine gel or patches to the area to help with symptoms. 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 No concerning symptoms present today. Lungs are clear to auscultation with no signs of decreased air movement.  Will monitor closely for any concerning signs or symptoms.      Atherosclerosis of aorta ([place])    Patient is statin intolerant and not currently on any medication. We will plan to monitor labs at next visit and evaluate for any changes that may need to take place in order to help reduce risk of heart attack and stroke. No alarm symptoms present today.      Symptomatic anemia - Primary    Recent episode of symptomatic anemia appears to have resolved at this time. Most recent hemoglobin levels have returned to baseline and patient is not experiencing any of the negative signs or symptoms. 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 Patient is currently followed by Dr. Rondel Oh with hematology/oncology.  We will follow along with his notes and am happy to help in any way possible. Recommend immediate follow-up if any signs or symptoms of bleeding or new signs of symptoms of anemia present.      Other Visit Diagnoses     Encounter for medical examination to establish care            Return for April/May for follow-up.      Ramie Erman, [name], NP, DNP, AGNP-C Primary Care [facility] Medicine at [place]

## 2021-12-08 NOTE — Assessment & Plan Note (Signed)
Recent episode of symptomatic anemia appears to have resolved at this time. Most recent hemoglobin levels have returned to baseline and patient is not experiencing any of the negative signs or symptoms. 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 Patient is currently followed by Dr. Rondel Oh with hematology/oncology.  We will follow along with his notes and am happy to help in any way possible. Recommend immediate follow-up if any signs or symptoms of bleeding or new signs of symptoms of anemia present.

## 2021-12-08 NOTE — Assessment & Plan Note (Signed)
AV malformation bilaterally in the lower lobes of the lungs. No concerning symptoms present today. Lungs are clear to auscultation with no signs of decreased air movement.  Will monitor closely for any concerning signs or symptoms.

## 2021-12-08 NOTE — Assessment & Plan Note (Signed)
Well-controlled with levothyroxine.  Labs recently with previous PCP reviewed today. We will plan to follow-up with repeat labs in about 6 months or sooner if needed.

## 2021-12-08 NOTE — Patient Instructions (Signed)
Thank you for choosing [place] [facility] for your Primary Care needs. I am excited for the opportunity to partner with you to meet your health care goals. It was a pleasure meeting you today!  Recommendations from today's visit: I will follow with Dr. Benay Spice. If you have any concerns or questions please do not hesitate to reach out to [place].  We will plan to follow-up in April or May  Merry Christmas!  Information on diet, exercise, and health maintenance recommendations are listed below. This is information to help you be sure you are on track for optimal health and monitoring.   Please look over this and let us know if you have any questions or if you have completed any of the health maintenance outside of [place] so that we can be sure your records are up to date.  ___________________________________________________________ About Me: I am an Adult-Geriatric Nurse Practitioner with a background in caring for patients for more than 20 years with a strong intensive care background. I provide primary care and sports medicine services to patients age 36 and older within this office. My education had a strong focus on caring for the older adult population, which I am passionate about. I am also the director of the APP Fellowship with [facility].   My desire is to provide you with the best service through preventive medicine and supportive care. I consider you a part of the medical team and value your input. I work diligently to ensure that you are heard and your needs are met in a safe and effective manner. I want you to feel comfortable with me as your provider and want you to know that your health concerns are important to me.  For your information, our office hours are: Monday, Tuesday, and Thursday 8:00 AM - 5:00 PM Wednesday and Friday 8:00 AM - 12:00 PM.   In my time away from the office I am teaching new APP's within the system and am unavailable, but my  partner, Dr. Burnard Bunting is in the office for emergent needs.   If you have questions or concerns, please call our office at [phone] or send [place] a MyChart message and we will respond as quickly as possible.  ____________________________________________________________ MyChart:  For all urgent or time sensitive needs we ask that you please call the office to avoid delays. Our number is [phone]. MyChart is not constantly monitored and due to the large volume of messages a day, replies may take up to 72 business hours.  MyChart Policy: MyChart allows for you to see your visit notes, after visit summary, provider recommendations, lab and tests results, make an appointment, request refills, and contact your provider or the office for non-urgent questions or concerns. Providers are seeing patients during normal business hours and do not have built in time to review MyChart messages.  We ask that you allow a minimum of 3 business days for responses to [employer]. For this reason, please do not send urgent requests through [place]. Please call the office at [phone]. New and ongoing conditions may require a visit. We have virtual and in person visit available for your convenience.  Complex MyChart concerns may require a visit. Your provider may request you schedule a virtual or in person visit to ensure we are providing the best care possible. MyChart messages sent after 11:00 AM on Friday will not be received by the provider until Monday morning.    Lab and Test Results: You will  receive your lab and test results on MyChart as soon as they are completed and results have been sent by the lab or testing facility. Due to this service, you will receive your results BEFORE your provider.  I review lab and tests results each morning prior to seeing patients. Some results require collaboration with other providers to ensure you are receiving the most appropriate care. For this reason, we ask that  you please allow a minimum of 3-5 business days from the time the ALL results have been received for your provider to receive and review lab and test results and contact you about these.  Most lab and test result comments from the provider will be sent through [place]. Your provider may recommend changes to the plan of care, follow-up visits, repeat testing, ask questions, or request an office visit to discuss these results. You may reply directly to this message or call the office at [phone] to provide information for the provider or set up an appointment. In some instances, you will be called with test results and recommendations. Please let us know if this is preferred and we will make note of this in your chart to provide this for you.    If you have not heard a response to your lab or test results in 5 business days from all results returning to [place], please call the office to let us know. We ask that you please avoid calling prior to this time unless there is an emergent concern. Due to high call volumes, this can delay the resulting process.  After Hours: For all non-emergency after hours needs, please call the office at [phone] and select the option to reach the on-call provider service. On-call services are shared between multiple [place] offices and therefore it will not be possible to speak directly with your provider. On-call providers may provide medical advice and recommendations, but are unable to provide refills for maintenance medications.  For all emergency or urgent medical needs after normal business hours, we recommend that you seek care at the closest Urgent Care or Emergency Department to ensure appropriate treatment in a timely manner.  [facility] [place] has a 24 hour emergency room located on the ground floor for your convenience.   Urgent Concerns During the Business Day Providers are seeing patients from 8AM to [place] with a busy schedule and are  most often not able to respond to non-urgent calls until the end of the day or the next business day. If you should have URGENT concerns during the day, please call and speak to the nurse or schedule a same day appointment so that we can address your concern without delay.   Thank you, again, for choosing me as your health care partner. I appreciate your trust and look forward to learning more about you.   Gregory Keeler, DNP, AGNP-c ___________________________________________________________  Health Maintenance Recommendations Screening Testing Mammogram Every 1 -2 years based on history and risk factors Starting at age 80 Pap Smear Ages 21-39 every 3 years Ages 36-65 every 5 years with HPV testing More frequent testing may be required based on results and history Colon Cancer Screening Every 1-10 years based on test performed, risk factors, and history Starting at age 38 Bone Density Screening Every 2-10 years based on history Starting at age 41 for women Recommendations for men differ based on medication usage, history, and risk factors AAA Screening One time ultrasound Men 60-81 years old who have every smoked Lung Cancer Screening Low  Dose Lung CT every 12 months Age 56-80 years with a 30 pack-year smoking history who still smoke or who have quit within the last 15 years  Screening Labs Routine  Labs: Complete Blood Count (CBC), Complete Metabolic Panel (CMP), Cholesterol (Lipid Panel) Every 6-12 months based on history and medications May be recommended more frequently based on current conditions or previous results Hemoglobin A1c Lab Every 3-12 months based on history and previous results Starting at age 16 or earlier with diagnosis of diabetes, high cholesterol, BMI >26, and/or risk factors Frequent monitoring for patients with diabetes to ensure blood sugar control Thyroid Panel (TSH w/ T3 & T4) Every 6 months based on history, symptoms, and risk factors May be  repeated more often if on medication HIV One time testing for all patients 74 and older May be repeated more frequently for patients with increased risk factors or exposure Hepatitis C One time testing for all patients 56 and older May be repeated more frequently for patients with increased risk factors or exposure Gonorrhea, Chlamydia Every 12 months for all sexually active persons 13-24 years Additional monitoring may be recommended for those who are considered high risk or who have symptoms PSA Men 20-68 years old with risk factors Additional screening may be recommended from age 1-69 based on risk factors, symptoms, and history  Vaccine Recommendations Tetanus Booster All adults every 10 years Flu Vaccine All patients 6 months and older every year COVID Vaccine All patients 12 years and older Initial dosing with booster May recommend additional booster based on age and health history HPV Vaccine 2 doses all patients age 68-26 Dosing may be considered for patients over 26 Shingles Vaccine (Shingrix) 2 doses all adults 96 years and older Pneumonia (Pneumovax 23) All adults 12 years and older May recommend earlier dosing based on health history Pneumonia (Prevnar 78) All adults 56 years and older Dosed 1 year after Pneumovax 23  Additional Screening, Testing, and Vaccinations may be recommended on an individualized basis based on family history, health history, risk factors, and/or exposure.  __________________________________________________________  Diet Recommendations for All Patients  I recommend that all patients maintain a diet low in saturated fats, carbohydrates, and cholesterol. While this can be challenging at first, it is not impossible and small changes can make big differences.  Things to try: Decreasing the amount of soda, sweet tea, and/or juice to one or less per day and replace with water While water is always the first choice, if you do not like water  you may consider adding a water additive without sugar to improve the taste other sugar free drinks Replace potatoes with a brightly colored vegetable at dinner Use healthy oils, such as canola oil or olive oil, instead of butter or hard margarine Limit your bread intake to two pieces or less a day Replace regular pasta with low carb pasta options Bake, broil, or grill foods instead of frying Monitor portion sizes  Eat smaller, more frequent meals throughout the day instead of large meals  An important thing to remember is, if you love foods that are not great for your health, you don't have to give them up completely. Instead, allow these foods to be a reward when you have done well. Allowing yourself to still have special treats every once in a while is a nice way to tell yourself thank you for working hard to keep yourself healthy.   Also remember that every day is a new day. If you have a bad day  and "fall off the wagon", you can still climb right back up and keep moving along on your journey!  We have resources available to help you!  Some websites that may be helpful include: www.http://carter.biz/  Www.VeryWell[employer].com _____________________________________________________________  Activity Recommendations for All Patients  I recommend that all adults get at least 20 minutes of moderate physical activity that elevates your heart rate at least 5 days out of the week.  Some examples include: Walking or jogging at a pace that allows you to carry on a conversation Cycling (stationary bike or outdoors) Water aerobics Yoga Weight lifting Dancing If physical limitations prevent you from putting stress on your joints, exercise in a pool or seated in a chair are excellent options.  Do determine your MAXIMUM heart rate for activity: YOUR AGE - 220 = MAX HeartRate   Remember! Do not push yourself too hard.  Start slowly and build up your pace, speed, weight, time in exercise, etc.  Allow  your body to rest between exercise and get good sleep. You will need more water than normal when you are exerting yourself. Do not wait until you are thirsty to drink. Drink with a purpose of getting in at least 8, 8 ounce glasses of water a day plus more depending on how much you exercise and sweat.    If you begin to develop dizziness, chest pain, abdominal pain, jaw pain, shortness of breath, headache, vision changes, lightheadedness, or other concerning symptoms, stop the activity and allow your body to rest. If your symptoms are severe, seek emergency evaluation immediately. If your symptoms are concerning, but not severe, please let us know so that we can recommend further evaluation.

## 2021-12-08 NOTE — Assessment & Plan Note (Signed)
Patient is statin intolerant and not currently on any medication. We will plan to monitor labs at next visit and evaluate for any changes that may need to take place in order to help reduce risk of heart attack and stroke. No alarm symptoms present today.

## 2021-12-08 NOTE — Assessment & Plan Note (Signed)
Longstanding diagnosis with severe symptoms including frequent and high risk for bleeds. Will need to avoid all NSAIDs and blood thinners due to this condition. 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 We will continue to monitor and keep a close eye on labs to ensure that he is no longer experiencing any negative side effects from this condition.

## 2021-12-08 NOTE — Assessment & Plan Note (Signed)
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 This is most likely what is causing the left shoulder and left hip pain.  We will send the referral today to the orthopedist for further evaluation and recommendations. May use lidocaine gel or patches to the area to help with symptoms. 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

## 2021-12-15 DIAGNOSIS — M7062 Trochanteric bursitis, left hip: Secondary | ICD-10-CM

## 2021-12-15 DIAGNOSIS — M4316 Spondylolisthesis, lumbar region: Secondary | ICD-10-CM | POA: Insufficient documentation

## 2021-12-15 HISTORY — DX: Trochanteric bursitis, left hip: M70.62

## 2021-12-17 ENCOUNTER — Telehealth: Payer: Medicare Other

## 2021-12-22 ENCOUNTER — Telehealth (HOSPITAL_BASED_OUTPATIENT_CLINIC_OR_DEPARTMENT_OTHER): Payer: Self-pay

## 2021-12-22 DIAGNOSIS — R059 Cough, unspecified: Secondary | ICD-10-CM

## 2021-12-22 MED ORDER — GABAPENTIN 100 MG PO CAPS: 100.0000 mg | ORAL_CAPSULE | Freq: Two times a day (BID) | ORAL | 1 refills | Status: DC

## 2021-12-22 NOTE — Telephone Encounter (Signed)
Patient called requesting to have refill on gabapentin sent to [facility] pharmacy on file

## 2022-01-06 ENCOUNTER — Telehealth (HOSPITAL_BASED_OUTPATIENT_CLINIC_OR_DEPARTMENT_OTHER): Payer: Self-pay

## 2022-01-06 DIAGNOSIS — K219 Gastro-esophageal reflux disease without esophagitis: Secondary | ICD-10-CM

## 2022-01-06 DIAGNOSIS — J4530 Mild persistent asthma, uncomplicated: Secondary | ICD-10-CM

## 2022-01-06 MED ORDER — MONTELUKAST SODIUM 10 MG PO TABS: 10.0000 mg | ORAL_TABLET | Freq: Every day | ORAL | 1 refills | Status: DC

## 2022-01-06 MED ORDER — PANTOPRAZOLE SODIUM 40 MG PO TBEC: 40.0000 mg | DELAYED_RELEASE_TABLET | Freq: Two times a day (BID) | ORAL | 1 refills | Status: DC

## 2022-01-06 NOTE — Telephone Encounter (Signed)
Received a fax request for med refill

## 2022-01-08 ENCOUNTER — Other Ambulatory Visit (HOSPITAL_BASED_OUTPATIENT_CLINIC_OR_DEPARTMENT_OTHER): Payer: Self-pay | Admitting: Nurse Practitioner

## 2022-01-08 DIAGNOSIS — E039 Hypothyroidism, unspecified: Secondary | ICD-10-CM

## 2022-01-08 MED ORDER — LEVOTHYROXINE SODIUM 125 MCG PO TABS: 125.0000 ug | ORAL_TABLET | Freq: Every day | ORAL | 3 refills | Status: DC

## 2022-01-12 DIAGNOSIS — R3915 Urgency of urination: Secondary | ICD-10-CM

## 2022-01-12 HISTORY — DX: Urgency of urination: R39.15

## 2022-01-26 ENCOUNTER — Other Ambulatory Visit: Payer: Self-pay | Admitting: Dermatology

## 2022-01-27 ENCOUNTER — Other Ambulatory Visit: Payer: Self-pay

## 2022-01-27 ENCOUNTER — Inpatient Hospital Stay: Payer: Medicare Other | Attending: Nurse Practitioner

## 2022-01-27 DIAGNOSIS — D509 Iron deficiency anemia, unspecified: Secondary | ICD-10-CM | POA: Insufficient documentation

## 2022-01-27 DIAGNOSIS — D500 Iron deficiency anemia secondary to blood loss (chronic): Secondary | ICD-10-CM

## 2022-01-27 LAB — CBC WITH DIFFERENTIAL (CANCER CENTER ONLY)
Abs Immature Granulocytes: 0.05 10*3/uL (ref 0.00–0.07)
Basophils Absolute: 0 10*3/uL (ref 0.0–0.1)
Basophils Relative: 1 %
Eosinophils Absolute: 0.1 10*3/uL (ref 0.0–0.5)
Eosinophils Relative: 1 %
HCT: 44.1 % (ref 39.0–52.0)
Hemoglobin: 15.3 g/dL (ref 13.0–17.0)
Immature Granulocytes: 1 %
Lymphocytes Relative: 38 %
Lymphs Abs: 2.5 10*3/uL (ref 0.7–4.0)
MCH: 31.8 pg (ref 26.0–34.0)
MCHC: 34.7 g/dL (ref 30.0–36.0)
MCV: 91.7 fL (ref 80.0–100.0)
Monocytes Absolute: 0.7 10*3/uL (ref 0.1–1.0)
Monocytes Relative: 10 %
Neutro Abs: 3.2 10*3/uL (ref 1.7–7.7)
Neutrophils Relative %: 49 %
Platelet Count: 275 10*3/uL (ref 150–400)
RBC: 4.81 MIL/uL (ref 4.22–5.81)
RDW: 13.7 % (ref 11.5–15.5)
WBC Count: 6.6 10*3/uL (ref 4.0–10.5)
nRBC: 0 % (ref 0.0–0.2)

## 2022-01-27 LAB — DERMATOPATH , 2 SPECIMENS

## 2022-01-27 LAB — DERMATOPATHOLOGY REPORT

## 2022-02-11 ENCOUNTER — Encounter (HOSPITAL_BASED_OUTPATIENT_CLINIC_OR_DEPARTMENT_OTHER): Payer: Self-pay | Admitting: Nurse Practitioner

## 2022-02-15 DIAGNOSIS — L821 Other seborrheic keratosis: Secondary | ICD-10-CM | POA: Insufficient documentation

## 2022-02-15 DIAGNOSIS — Z85828 Personal history of other malignant neoplasm of skin: Secondary | ICD-10-CM | POA: Insufficient documentation

## 2022-02-15 DIAGNOSIS — L719 Rosacea, unspecified: Secondary | ICD-10-CM | POA: Insufficient documentation

## 2022-02-15 DIAGNOSIS — D1800 Hemangioma unspecified site: Secondary | ICD-10-CM | POA: Insufficient documentation

## 2022-02-15 DIAGNOSIS — L814 Other melanin hyperpigmentation: Secondary | ICD-10-CM | POA: Insufficient documentation

## 2022-02-15 DIAGNOSIS — D225 Melanocytic nevi of trunk: Secondary | ICD-10-CM | POA: Insufficient documentation

## 2022-02-16 ENCOUNTER — Ambulatory Visit (INDEPENDENT_AMBULATORY_CARE_PROVIDER_SITE_OTHER): Payer: Medicare Other | Admitting: Nurse Practitioner

## 2022-02-16 ENCOUNTER — Encounter (HOSPITAL_BASED_OUTPATIENT_CLINIC_OR_DEPARTMENT_OTHER): Payer: Self-pay | Admitting: Nurse Practitioner

## 2022-02-16 ENCOUNTER — Other Ambulatory Visit: Payer: Self-pay

## 2022-02-16 VITALS — BP 117/72 | HR 72 | Resp 14 | Ht 70.0 in | Wt 186.1 lb

## 2022-02-16 DIAGNOSIS — I5032 Chronic diastolic (congestive) heart failure: Secondary | ICD-10-CM | POA: Diagnosis not present

## 2022-02-16 DIAGNOSIS — G4762 Sleep related leg cramps: Secondary | ICD-10-CM | POA: Diagnosis not present

## 2022-02-16 DIAGNOSIS — R42 Dizziness and giddiness: Secondary | ICD-10-CM

## 2022-02-16 DIAGNOSIS — E222 Syndrome of inappropriate secretion of antidiuretic hormone: Secondary | ICD-10-CM | POA: Diagnosis not present

## 2022-02-16 NOTE — Assessment & Plan Note (Signed)
History of SIADH with chronic hyponatremia. Will monitor labs today for evaluation of this.  Recommend increased sodium intake- restart gatorade for now. Will consider medication management with Vaprisol or Samsca if levels are significantly low. Will need correction prior to surgery to reduce risks.  Suspect that his nocturnal pain and dizziness are likely related to this.  No mental status changes, cardiac concerns, or hypotension in the office today.  Will monitor results and make changes to plan of care based on labs.

## 2022-02-16 NOTE — Progress Notes (Signed)
Established Patient Office Visit  Subjective:  Patient ID: Gregory [name], male    DOB: 02/28/1941  Age: 81 y.o. MRN: [id number]  CC:  Chief Complaint  Patient presents with   Follow-up    Patient presents today with bilateral leg cramps in the calf of his legs. They are waking him up at night. He is also expressing more dizziness than before.      HPI Gregory [name] presents for concerns with cramping and sensation to move legs at night. He reports that symptoms have been present before, but they resolved spontaneously. He tells me that the cramping has been waking him from his sleep. He does have a history of electrolyte imbalance. He has not had this checked in a while. He endorses that he used to drink gatorade on a regular basis to help with his hydration and electrolytes and he has not been doing that in a while. He denies swelling, redness, warmth, pinpoint pain. He endorses the cramping is throughout his lower legs from the knees down. Nothing seems to make it better or worse. He is not on statin therapy.  He is planning to have a TURP in the next two weeks and he would like to make sure that this is not anything serious.   He also endorses recent increased in dizziness. He has had this problem on and off in the past and typically treats this with PRN meclizine. The meclizine is currently working, but he is having more dizziness than before and that is worrisome for him. He denies CP, palpitations, jaw pain, arm pain, sweating, or nausea associated with the dizziness. He endorses this does seem to be worse with position changes and standing.    Outpatient Medications Prior to Visit  Medication Sig Dispense Refill   ciclopirox (PENLAC) 8 % solution 1 application to toe nails     fluorouracil (EFUDEX) 5 % cream 1 application to skin cancers on scalp     clorazepate (TRANXENE) 7.5 MG tablet 1 tablet     levothyroxine (SYNTHROID) 125 MCG tablet Take by mouth.     montelukast  (SINGULAIR) 10 MG tablet 1 tablet     acetaminophen (TYLENOL) 650 MG CR tablet Take 1,300 mg by mouth 2 (two) times daily.     amoxicillin (AMOXIL) 500 MG capsule SMARTSIG:4 Capsule(s) By Mouth PRN 4 capsule 3   Ascorbic Acid (VITAMIN C) 1000 MG tablet 1 tablet     Cholecalciferol 125 MCG (5000 UT) capsule Take 5,000 Units by mouth daily.     clidinium-chlordiazePOXIDE (LIBRAX) 5-2.5 MG capsule Take 1 capsule by mouth 3 (three) times daily before meals. 90 capsule 3   fluorometholone (FML) 0.1 % ophthalmic suspension SMARTSIG:1 Drop(s) In Eye(s) 3 Times Daily PRN     gabapentin (NEURONTIN) 100 MG capsule Take 1 capsule (100 mg total) by mouth 2 (two) times daily. 180 capsule 1   Iron, Ferrous Sulfate, 325 (65 Fe) MG TABS Take 325 mg by mouth in the morning, at noon, and at bedtime. 30 tablet 2   levocetirizine (XYZAL) 5 MG tablet Take 1 tablet (5 mg total) by mouth every evening. 90 tablet 1   levothyroxine (SYNTHROID) 125 MCG tablet Take 1 tablet (125 mcg total) by mouth daily before breakfast. 90 tablet 3   Magnesium 250 MG TABS Take 250 mg by mouth at bedtime.      meclizine (ANTIVERT) 25 MG tablet Take 25 mg by mouth 2 (two) times daily as needed for dizziness.  methocarbamol (ROBAXIN) 500 MG tablet Take 1 tablet (500 mg total) by mouth every 8 (eight) hours as needed for muscle spasms. 270 tablet 0   Misc Natural Products (SAW PALMETTO) CAPS Take 1 capsule by mouth at bedtime.     montelukast (SINGULAIR) 10 MG tablet Take 1 tablet (10 mg total) by mouth at bedtime. 90 tablet 1   Multiple Vitamin (MULTIVITAMIN WITH MINERALS) TABS tablet Take 1 tablet by mouth every morning.     pantoprazole (PROTONIX) 40 MG tablet Take 1 tablet (40 mg total) by mouth 2 (two) times daily. 180 tablet 1   silodosin (RAPAFLO) 8 MG CAPS capsule Take 1 capsule (8 mg total) by mouth at bedtime. TAKE ONE CAPSULE BY MOUTH DAILY AT BEDTIME 90 capsule 1   zolpidem (AMBIEN) 10 MG tablet Take by mouth.      levocetirizine (XYZAL) 5 MG tablet Take 1 tablet by mouth every evening.     methocarbamol (ROBAXIN) 500 MG tablet 1.5 tablets     pantoprazole (PROTONIX) 40 MG tablet 1 tablet     No facility-administered medications prior to visit.    Allergies  Allergen Reactions   Nsaids Other (See Comments)    Cannot take-per MD   Other Other (See Comments)    All blood thinners-cannot take per MD   Aspirin Other (See Comments)    nosebleeds   Statins Other (See Comments)    Weakness, myalgias   Sulfa Antibiotics Other (See Comments)    Unknown reaction   Tizanidine Hcl Other (See Comments)    Patient stated that after taking this medication he experienced feelings of dizziness and disorientation.     ROS Review of Systems All review of systems negative except what is listed in the HPI    Objective:    Physical Exam Vitals and nursing note reviewed.  Constitutional:      General: He is not in acute distress.    Appearance: Normal appearance. He is not ill-appearing.  HENT:     Head: Normocephalic.     Right Ear: Tympanic membrane normal.     Left Ear: Tympanic membrane normal.  Eyes:     General: Lids are normal. Lids are everted, no foreign bodies appreciated. Vision grossly intact. Gaze aligned appropriately. No visual field deficit.    Extraocular Movements: Extraocular movements intact.     Right eye: Normal extraocular motion and no nystagmus.     Left eye: Nystagmus present. Normal extraocular motion.     Conjunctiva/sclera: Conjunctivae normal.     Pupils: Pupils are equal, round, and reactive to light.  Neck:     Vascular: No carotid bruit.  Cardiovascular:     Rate and Rhythm: Normal rate and regular rhythm.     Pulses: Normal pulses.     Heart sounds: Normal heart sounds. No murmur heard. Pulmonary:     Effort: Pulmonary effort is normal.     Breath sounds: Normal breath sounds. No wheezing.  Abdominal:     General: Bowel sounds are normal.     Palpations:  Abdomen is soft.  Musculoskeletal:        General: No swelling, tenderness or signs of injury. Normal range of motion.     Cervical back: Normal range of motion. No rigidity or tenderness.     Right lower leg: No edema.     Left lower leg: No edema.  Lymphadenopathy:     Cervical: No cervical adenopathy.  Skin:    General: Skin is warm and  dry.     Capillary Refill: Capillary refill takes less than 2 seconds.  Neurological:     General: No focal deficit present.     Mental Status: He is alert and oriented to person, place, and time.     Cranial Nerves: No cranial nerve deficit.     Sensory: No sensory deficit.     Motor: No weakness.     Coordination: Coordination normal.     Gait: Gait normal.  Psychiatric:        Mood and Affect: Mood normal.        Behavior: Behavior normal.        Thought Content: Thought content normal.        Judgment: Judgment normal.    There were no vitals taken for this visit. Wt Readings from Last 3 Encounters:  12/08/21 188 lb (85.3 kg)  11/26/21 187 lb 12.8 oz (85.2 kg)  10/30/21 186 lb (84.4 kg)     Assessment & Plan:   Problem List Items Addressed This Visit     Dizziness - Primary    History of SIADH with chronic hyponatremia. Will monitor labs today for evaluation of this.  Recommend increased sodium intake- restart gatorade for now. Will consider medication management with Vaprisol or Samsca if levels are significantly low. Will need correction prior to surgery to reduce risks.  Suspect that his nocturnal pain and dizziness are likely related to this.  No mental status changes, cardiac concerns, or hypotension in the office today.  Will monitor results and make changes to plan of care based on labs.      Relevant Orders   Comprehensive metabolic panel   Magnesium   Nocturnal leg cramps    History of SIADH with chronic hyponatremia. Will monitor labs today for evaluation of this.  Recommend increased sodium intake- restart gatorade  for now. Will consider medication management with Vaprisol or Samsca if levels are significantly low. Will need correction prior to surgery to reduce risks.  Suspect that his nocturnal pain and dizziness are likely related to this.  No mental status changes, cardiac concerns, or hypotension in the office today.  Will monitor results and make changes to plan of care based on labs.      Relevant Orders   Comprehensive metabolic panel   Magnesium   SIADH (syndrome of inappropriate ADH production) (HCC)    History of SIADH with chronic hyponatremia. Will monitor labs today for evaluation of this.  Recommend increased sodium intake- restart gatorade for now. Will consider medication management with Vaprisol or Samsca if levels are significantly low. Will need correction prior to surgery to reduce risks.  Suspect that his nocturnal pain and dizziness are likely related to this.  No mental status changes, cardiac concerns, or hypotension in the office today.  Will monitor results and make changes to plan of care based on labs.      Chronic diastolic heart failure (HCC)    No signs of fluid volume overload or concerning symptoms present today.  Appears euvolemic, but suspect low sodium levels due to history and recent cramping and dizziness. Will monitor labs for further recommendations.        No orders of the defined types were placed in this encounter.   Follow-up: Return for TBD based on labs .    [name], NP

## 2022-02-16 NOTE — Patient Instructions (Signed)
I want to make sure your electrolytes are OK today with the leg cramps and dizziness. In the past your salt has been very low which can certainly make this much worse.   I recommend restarting the Gatorade at least once a day and we will see if the cramping of the legs improves. We can always consider medication options if this does not change with increased hydration. If I notice that any of your electrolytes are really off we will talk about a supplement for that.

## 2022-02-16 NOTE — Assessment & Plan Note (Signed)
No signs of fluid volume overload or concerning symptoms present today.  Appears euvolemic, but suspect low sodium levels due to history and recent cramping and dizziness. Will monitor labs for further recommendations.

## 2022-02-17 LAB — COMPREHENSIVE METABOLIC PANEL
ALT: 16 IU/L (ref 0–44)
AST: 17 IU/L (ref 0–40)
Albumin/Globulin Ratio: 2.6 — ABNORMAL HIGH (ref 1.2–2.2)
Albumin: 4.7 g/dL (ref 3.7–4.7)
Alkaline Phosphatase: 72 IU/L (ref 44–121)
BUN/Creatinine Ratio: 13 (ref 10–24)
BUN: 13 mg/dL (ref 8–27)
Bilirubin Total: 0.4 mg/dL (ref 0.0–1.2)
CO2: 26 mmol/L (ref 20–29)
Calcium: 9.5 mg/dL (ref 8.6–10.2)
Chloride: 94 mmol/L — ABNORMAL LOW (ref 96–106)
Creatinine, Ser: 0.98 mg/dL (ref 0.76–1.27)
Globulin, Total: 1.8 g/dL (ref 1.5–4.5)
Glucose: 101 mg/dL — ABNORMAL HIGH (ref 70–99)
Potassium: 4.6 mmol/L (ref 3.5–5.2)
Sodium: 132 mmol/L — ABNORMAL LOW (ref 134–144)
Total Protein: 6.5 g/dL (ref 6.0–8.5)
eGFR: 78 mL/min/{1.73_m2} (ref >59)

## 2022-02-17 LAB — MAGNESIUM: Magnesium: 2.1 mg/dL (ref 1.6–2.3)

## 2022-03-05 ENCOUNTER — Telehealth (HOSPITAL_BASED_OUTPATIENT_CLINIC_OR_DEPARTMENT_OTHER): Payer: Self-pay

## 2022-03-05 ENCOUNTER — Telehealth: Payer: Self-pay | Admitting: Cardiology

## 2022-03-05 ENCOUNTER — Other Ambulatory Visit (HOSPITAL_BASED_OUTPATIENT_CLINIC_OR_DEPARTMENT_OTHER): Payer: Self-pay | Admitting: Nurse Practitioner

## 2022-03-05 DIAGNOSIS — R030 Elevated blood-pressure reading, without diagnosis of hypertension: Secondary | ICD-10-CM

## 2022-03-05 MED ORDER — VALSARTAN 40 MG PO TABS: 40.0000 mg | ORAL_TABLET | Freq: Every day | ORAL | 3 refills | Status: DC

## 2022-03-05 NOTE — Telephone Encounter (Signed)
Called patient, advised of message from MD.  Patient verbalized understanding.   

## 2022-03-05 NOTE — Telephone Encounter (Signed)
Contacted patient, he states that he has TURP surgery on 03/14- since that surgery he has had elevated blood pressures. He states that he is not in any pain at this time, and no pain occurrence from his surgery. However, he does complaint of a headache, this is his only symptom. Most recent BP was 153/85. After review of his chart, PCP send in medication today Valsartan 40 mg daily (he has not started this or picked it up from the pharmacy) as he was requesting it to be reviewed by MD/PHARMD here at our office. Patient states he had some memory issues on Wednesday after his surgery (his office is aware of this) they completed a scan which came back clear (I did not see this in his chart). Patient states he did have some confusion this morning, we did discuss the need to see his Neurologist to speak with them, he was advised he was told by Urologist to do this as well- he is established with [facility].  ? ?I will route to MD/PHARMD for bp concerns and starting of Valsartan.  ? ?

## 2022-03-05 NOTE — Telephone Encounter (Signed)
Pt c/o BP issue: STAT if pt c/o blurred vision, one-sided weakness or slurred speech ? ?1. What are your last 5 BP readings?  ? ?3/17: ?183/?? ?163/85 73 ?158/83 70 ? ?3/14:  ?189/?? ? ?2. Are you having any other symptoms (ex. Dizziness, headache, blurred vision, passed out)?  ?Headache  ? ?3. What is your BP issue?  ? ?Patient's wife states patient's BP has been elevated. ? ? ?

## 2022-03-05 NOTE — Telephone Encounter (Signed)
Patient called with concerns for elevated BP since TURP procedure on Tuesday morning. Initial BP was elevated post-operatively at 056 systolic. He was discharged home without complication.  ?He reports Wednesday morning he woke up confused, not sure where he was, and had a headache. He reports he contacted his urologist who sent him for a CT, which was negative for any acute findings. The confusion subsided and he returned home.  ?He tells me that this morning when he awoke he was having the same symptoms, but the confusion was much shorter in length and subsided after a few minutes. He does still have a headache.  ?This morning at home his BP was 163/85 and 158/83. When he went to the urologist office for f/u his BP was 979 systolic.  ?They recommended he contact his PCP for further recommendations.  ?He has not hx of HTN and is not on any medication for HTN at this time.  ? ?He denies weakness, vision changes, difficulty speaking or swallowing. He does endorse the headache is still present, but improved after taking Tylenol and sinus medication.  ? ?Will plan to start valsartan '40mg'$  and recommend monitoring BP over the weekend. IF BP remains above 170/90 (either number) over the weekend or if symptoms return, recommend seeking evaluation in urgent care or emergency room.  ? ?If BP is less than 170/90 but higher than 150/85, please call the office Monday to let us know.  ?

## 2022-03-10 ENCOUNTER — Telehealth: Payer: Self-pay | Admitting: Neurology

## 2022-03-10 NOTE — Progress Notes (Signed)
? ?Assessment/Plan:  ? ? ?Memory difficulties, likely a combination of anxiety and recent anesthesia ? ?SLUMS today is 29/30, with delayed recall 4/5.  There is no concern at this time for any cognitive disorder such as dementia or mild cognitive impairment.  Exam is within normal limits.  Recent labs are normal.  Most recent CT of the head is without acute findings. ? ? Recommendations:  ? ?Discussed safety both in and out of the home.  ?Discussed the importance of regular daily schedule with inclusion of crossword puzzles to maintain brain function.  ?Continue to monitor mood by PCP ?Stay active at least 30 minutes at least 3 times a week.  ?Naps should be scheduled and should be no longer than 60 minutes and should not occur after 2 PM.  ?Mediterranean diet is recommended  ?Control cardiovascular risk factors  ?Patient to follow-up with Dr. Posey Pronto regarding gait unsteadiness, as well as drug-induced neuropathy.  Continue gabapentin 100 mg twice daily ?Monitor his sodium levels as an outpatient, as these could contribute to acute mental status changes. ?Check EEG as recommended by inpatient neurology ?Follow up as needed ? ? ?Case discussed with Dr. Delice Lesch who agrees with the plan ? ? ? ? ?Subjective:  ? ? ?Gregory [name] is a very pleasant 81 y.o. 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 This patient is accompanied in the office by his wife who supplements the history.  Previous records as well as any outside records available were reviewed prior to todays visit.  The patient had an acute neurologic episode postoperatively after TURP on 03/02/2022, with increased confusion, not knowing where he was, unsure what  his name was.  He did report a headache at the time.  He had a CT of the head as well as angiography, which was negative for acute findings.  He was noted to have elevated blood pressure at the time, close to 503 systolic.  Neurology was consulted, advising outpatient follow-up with [place], as well as recommending an EEG.  He was unable to undergo MRI due to a retained piece of metal in his head.  He is here for further evaluation. ? ?The patient is a retired Programmer, systems, and he is quite active, spending time with his grandkids, doing routine ADLs, and enjoying his free time.  He also enjoys "doing Occupational hygienist and stocks ".  He likes to walk around the neighborhood, and exercises with programs online.  He denies repeating himself, or asking the same questions.  He never had a similar episode.  He denies living objects in unusual places.  He drives without any problems, denies getting lost.  His mood is good, denies any depression or irritability.  He sleeps well, he denies any vivid dreams or sleepwalking, hallucinations or paranoia.  There are no hygiene concerns, he is independent of bathing and dressing.  His medications are in a pillbox, and he is in charge of them, without missing any doses.  He is in charge of his own finances.  His appetite is good, denies trouble swallowing.  He does not cook.  He ambulates without any difficulty, he does have chronic neuropathy, but there are no worsening symptoms.  He sees Dr. Posey Pronto regarding this issue.  He denies any recent head  injuries.  "In the past I had a brain abscess and attend his court concussion ".  He denies any headaches, double vision, he has a history of intermittent dizziness, for which he takes meclizine with good results.  He denies any new focal numbness or tingling or unilateral weakness, tremors or anosmia.  He denies any history of seizures.  He still is experiencing some symptoms since his surgery, "extreme is not that improved ".  He denies any  constipation or diarrhea. ? ? ?  03/11/2022  ? 12:00 PM  ?[employer] Mental Exam  ?Weekday Correct 1  ?Current year 1  ?What state are we in? 1  ?Amount spent 1  ?Amount left 2  ?# of Animals 3  ?5 objects recall 4  ?Number series 2  ?Hour markers 2  ?Time correct 2  ?Placed X in triangle correctly 1  ?Largest Figure 1  ?Name of male 2  ?Date back to work 2  ?Type of work 2  ?State she lived in 2  ?Total score 29  ?  ? ?PREVIOUS MEDICATIONS:  ? ?CURRENT MEDICATIONS:  ?Outpatient Encounter Medications as of 03/11/2022  ?Medication Sig  ? acetaminophen (TYLENOL) 650 MG CR tablet Take 1,300 mg by mouth 2 (two) times daily.  ? amoxicillin (AMOXIL) 500 MG capsule SMARTSIG:4 Capsule(s) By Mouth PRN  ? Ascorbic Acid (VITAMIN C) 1000 MG tablet 1 tablet  ? Cholecalciferol 125 MCG (5000 UT) capsule Take 5,000 Units by mouth daily.  ? ciclopirox (PENLAC) 8 % solution 1 application to toe nails  ? clidinium-chlordiazePOXIDE (LIBRAX) 5-2.5 MG capsule Take 1 capsule by mouth 3 (three) times daily before meals.  ? fluorometholone (FML) 0.1 % ophthalmic suspension SMARTSIG:1 Drop(s) In Eye(s) 3 Times Daily PRN  ? fluorouracil (EFUDEX) 5 % cream 1 application to skin cancers on scalp  ? gabapentin (NEURONTIN) 100 MG capsule Take 1 capsule (100 mg total) by mouth 2 (two) times daily.  ? Iron, Ferrous Sulfate, 325 (65 Fe) MG TABS Take 325 mg by mouth in the morning, at noon, and at bedtime.  ? levocetirizine (XYZAL) 5 MG tablet Take 1 tablet (5 mg total) by mouth every evening.  ? levothyroxine (SYNTHROID) 125 MCG tablet Take 1 tablet (125 mcg total) by mouth daily before breakfast.  ? Magnesium 250 MG TABS Take 250 mg by mouth at bedtime.   ? meclizine (ANTIVERT) 25 MG tablet Take 25 mg by mouth 2 (two) times daily as needed for dizziness.  ? methocarbamol (ROBAXIN) 500 MG tablet Take 1 tablet (500 mg total) by mouth every 8 (eight) hours as needed for muscle spasms.  ? Misc Natural Products (SAW PALMETTO) CAPS Take 1  capsule by mouth at bedtime.  ? montelukast (SINGULAIR) 10 MG tablet Take 1 tablet (10 mg total) by mouth at bedtime.  ? Multiple Vitamin (MULTIVITAMIN WITH MINERALS) TABS tablet Take 1 tablet by mouth every morning.  ? pantoprazole (PROTONIX) 40 MG tablet Take 1 tablet (40 mg total) by mouth 2 (two) times daily.  ? silodosin (RAPAFLO) 8 MG CAPS capsule Take 1 capsule (8 mg total) by mouth at bedtime. TAKE ONE CAPSULE BY MOUTH DAILY AT BEDTIME  ? valsartan (DIOVAN) 40 MG tablet Take 1 tablet (40 mg total) by mouth daily.  ? zolpidem (AMBIEN) 10 MG tablet Take by mouth.  ? ?No facility-administered encounter medications on file as of 03/11/2022.  ? ? ? ?Objective:  ?  ? ?PHYSICAL EXAMINATION:   ? ?VITALS:   ?Vitals:  ?  03/11/22 0919  ?BP: (!) 145/79  ?Pulse: 87  ?Resp: 18  ?SpO2: 95%  ?Weight: 184 lb (83.5 kg)  ?Height: '5\' 10"'$  (1.778 m)  ? ? ?GEN:  The patient appears stated age and is in NAD. ?HEENT:  Normocephalic, atraumatic.  ? ?Neurological examination: ? ?General: NAD, well-groomed, appears stated age. ?Orientation: The patient is alert. Oriented to person, place and date ?Cranial nerves: There is good facial symmetry.The speech is fluent and clear. No aphasia or dysarthria. Fund of knowledge is appropriate. Recent and remote memory are impaired. Attention and concentration are reduced.  Able to name objects and repeat phrases.  Hearing is intact to conversational tone.    ?Sensation: Sensation is intact to light touch throughout ?Motor: Strength is at least antigravity x4. ?Tremors: none  ?DTR's 2/4 in left UE/LE, right slightly brisker 2-3+ upper and lower extremity. ? ?  ? ?  03/11/2022  ? 12:00 PM  ?[employer] Mental Exam  ?Weekday Correct 1  ?Current year 1  ?What state are we in? 1  ?Amount spent 1  ?Amount left 2  ?# of Animals 3  ?5 objects recall 4  ?Number series 2  ?Hour markers 2  ?Time correct 2  ?Placed X in triangle correctly 1  ?Largest Figure 1  ?Name of male 2  ?Date back to work 2   ?Type of work 2  ?State she lived in 2  ?Total score 29  ?  ? ?  ?Movement examination: ?Tone: There is normal tone in the UE/LE ?Abnormal movements:  no tremor.  No myoclonus.  No asterixis.   ?Coordination:

## 2022-03-10 NOTE — Telephone Encounter (Signed)
I do not have any immediate availability.  Let's have him follow-up with Gregory [name] for memory complaints. ?

## 2022-03-10 NOTE — Telephone Encounter (Signed)
Patient left a message on the voice mail stating he had a surgery at [facility] last Wednesday.  ? ?He said he had some brain fog and confusion during the procedure and was advised to follow up with his current neurologist, Dr. Posey Pronto. ? ?Routing to clinical to advise on scheduling urgency. ?

## 2022-03-11 ENCOUNTER — Encounter: Payer: Self-pay | Admitting: Physician Assistant

## 2022-03-11 ENCOUNTER — Other Ambulatory Visit: Payer: Self-pay

## 2022-03-11 ENCOUNTER — Ambulatory Visit (INDEPENDENT_AMBULATORY_CARE_PROVIDER_SITE_OTHER): Payer: Medicare Other | Admitting: Physician Assistant

## 2022-03-11 VITALS — BP 145/79 | HR 87 | Resp 18 | Ht 70.0 in | Wt 184.0 lb

## 2022-03-11 DIAGNOSIS — R410 Disorientation, unspecified: Secondary | ICD-10-CM | POA: Diagnosis not present

## 2022-03-11 DIAGNOSIS — R4789 Other speech disturbances: Secondary | ICD-10-CM

## 2022-03-11 NOTE — Patient Instructions (Signed)
It was a pleasure to see you today at our office.  ? ?Recommendations: ? ?Follow up with Dr. Posey Pronto within  the next few months  ?EEG rule out seizures as recommended in hospital  ? ? ? ?RECOMMENDATIONS FOR ALL PATIENTS WITH MEMORY PROBLEMS: ?1. Continue to exercise (Recommend 30 minutes of walking everyday, or 3 hours every week) ?2. Increase social interactions - continue going to [place] and enjoy social gatherings with friends and family ?3. Eat healthy, avoid fried foods and eat more fruits and vegetables ?4. Maintain adequate blood pressure, blood sugar, and blood cholesterol level. Reducing the risk of stroke and cardiovascular disease also helps promoting better memory. ?5. Avoid stressful situations. Live a simple life and avoid aggravations. Organize your time and prepare for the next day in anticipation. ?6. Sleep well, avoid any interruptions of sleep and avoid any distractions in the bedroom that may interfere with adequate sleep quality ?7. Avoid sugar, avoid sweets as there is a strong link between excessive sugar intake, diabetes, and cognitive impairment ?We discussed the Mediterranean diet, which has been shown to help patients reduce the risk of progressive memory disorders and reduces cardiovascular risk. This includes eating fish, eat fruits and green leafy vegetables, nuts like almonds and hazelnuts, walnuts, and also use olive oil. Avoid fast foods and fried foods as much as possible. Avoid sweets and sugar as sugar use has been linked to worsening of memory function. ? ?There is always a concern of gradual progression of memory problems. If this is the case, then we may need to adjust level of care according to patient needs. Support, both to the patient and caregiver, should then be put into place.  ? ? ?FALL PRECAUTIONS: Be cautious when walking. Scan the area for obstacles that may increase the risk of trips and falls. When getting up in the mornings, sit up at the edge of the bed for a few  minutes before getting out of bed. Consider elevating the bed at the head end to avoid drop of blood pressure when getting up. Walk always in a well-lit room (use night lights in the walls). Avoid area rugs or power cords from appliances in the middle of the walkways. Use a walker or a cane if necessary and consider physical therapy for balance exercise. Get your eyesight checked regularly. ? ?FINANCIAL OVERSIGHT: Supervision, especially oversight when making financial decisions or transactions is also recommended. ? ?HOME SAFETY: Consider the safety of the kitchen when operating appliances like stoves, microwave oven, and blender. Consider having supervision and share cooking responsibilities until no longer able to participate in those. Accidents with firearms and other hazards in the house should be identified and addressed as well. ? ? ?ABILITY TO BE LEFT ALONE: If patient is unable to contact 911 operator, consider using LifeLine, or when the need is there, arrange for someone to stay with patients. Smoking is a fire hazard, consider supervision or cessation. Risk of wandering should be assessed by caregiver and if detected at any point, supervision and safe proof recommendations should be instituted. ? ?MEDICATION SUPERVISION: Inability to self-administer medication needs to be constantly addressed. Implement a mechanism to ensure safe administration of the medications. ? ? ?DRIVING: Regarding driving, in patients with progressive memory problems, driving will be impaired. We advise to have someone else do the driving if trouble finding directions or if minor accidents are reported. Independent driving assessment is available to determine safety of driving. ? ? ?If you are interested in the driving  assessment, you can contact the following: ? ?[facility] in [place] ? ?[place] [phone] ? ?[facility] [phone] ? ?[facility] [phone]  or [phone] ?  ?

## 2022-03-15 ENCOUNTER — Other Ambulatory Visit: Payer: Self-pay

## 2022-03-15 ENCOUNTER — Ambulatory Visit (INDEPENDENT_AMBULATORY_CARE_PROVIDER_SITE_OTHER): Payer: Medicare Other | Admitting: Neurology

## 2022-03-15 DIAGNOSIS — R410 Disorientation, unspecified: Secondary | ICD-10-CM

## 2022-03-17 NOTE — Procedures (Signed)
ELECTROENCEPHALOGRAM REPORT ? ?Date of Study: 03/15/2022 ? ?Patient's Name: Gregory [name] ?MRN: [id number] ?Date of Birth: [birth year] ? ?Referring Provider: Sharene Butters, PA-C ? ?Clinical History: This is a 81 year old man with history of left basal ganglia abscess with episode of increased confusion. EEG for classification. ? ?Medications: ?TYLENOL 650 MG CR tablet ?AMOXIL 500 MG capsule ?VITAMIN C 1000 MG tablet ?Cholecalciferol 125 MCG (5000 UT) capsule ?PENLAC 8 % solution ?LIBRAX 5-2.5 MG capsule ?EFUDEX 5 % cream ?FML 0.1 % ophthalmic suspension ?NEURONTIN 100 MG capsule ?65 Fe MG TABS ?XYZAL 5 MG tablet ?SYNTHROID 125 MCG tablet ?Magnesium 250 MG TABS ?ANTIVERT 25 MG tablet ?ROBAXIN 500 MG tablet ?SAW PALMETTO CAPS ?SINGULAIR 10 MG tablet ?MULTIVITAMIN WITH MINERALS TABS  ?PROTONIX 40 MG tablet ?RAPAFLO 8 MG CAPS capsule ?DIOVAN 40 MG tablet ?AMBIEN 10 MG tablet ? ?Technical Summary: ?A multichannel digital EEG recording measured by the international 10-20 system with electrodes applied with paste and impedances below 5000 ohms performed in our laboratory with EKG monitoring in an awake and drowsy patient.  Hyperventilation was not performed. Photic stimulation was performed.  The digital EEG was referentially recorded, reformatted, and digitally filtered in a variety of bipolar and referential montages for optimal display.   ? ?Description: ?The patient is awake and drowsy during the recording.  During maximal wakefulness, there is a symmetric, medium voltage 8 Hz posterior dominant rhythm that attenuates with eye opening.  There is frequent focal polymorphic 4-5 Hz theta slowing over the left temporal region.  During drowsiness, there is an increase in theta slowing of the background. Sleep was not captured. Photic stimulation did not elicit any abnormalities.  There were no epileptiform discharges or electrographic seizures seen.   ? ?EKG lead was unremarkable. ? ?Impression: ?This awake and drowsy EEG is  abnormal due to focal slowing over the left temporal region. ? ?Clinical Correlation of the above findings indicates focal cerebral dysfunction over the left temporal region suggestive of underlying structural or physiologic abnormality. The absence of epileptiform discharges does not exclude a clinical diagnosis of epilepsy. Clinical correlation is advised. ? ? ?Ellouise Newer, M.D. ? ?

## 2022-03-19 NOTE — Progress Notes (Signed)
Please inform patient that the EEG is negative for seizures. Thanks

## 2022-03-23 ENCOUNTER — Inpatient Hospital Stay: Payer: Medicare Other | Attending: Nurse Practitioner

## 2022-03-23 ENCOUNTER — Inpatient Hospital Stay (HOSPITAL_BASED_OUTPATIENT_CLINIC_OR_DEPARTMENT_OTHER): Payer: Medicare Other | Admitting: Oncology

## 2022-03-23 ENCOUNTER — Encounter: Payer: Self-pay | Admitting: Oncology

## 2022-03-23 VITALS — BP 129/65 | HR 71 | Temp 97.8°F | Resp 20 | Ht 70.0 in | Wt 184.6 lb

## 2022-03-23 DIAGNOSIS — D509 Iron deficiency anemia, unspecified: Secondary | ICD-10-CM | POA: Insufficient documentation

## 2022-03-23 DIAGNOSIS — Z170 Estrogen receptor positive status [ER+]: Secondary | ICD-10-CM | POA: Insufficient documentation

## 2022-03-23 DIAGNOSIS — D500 Iron deficiency anemia secondary to blood loss (chronic): Secondary | ICD-10-CM | POA: Diagnosis not present

## 2022-03-23 DIAGNOSIS — I780 Hereditary hemorrhagic telangiectasia: Secondary | ICD-10-CM | POA: Diagnosis not present

## 2022-03-23 LAB — CBC WITH DIFFERENTIAL (CANCER CENTER ONLY)
Abs Immature Granulocytes: 0.03 10*3/uL (ref 0.00–0.07)
Basophils Absolute: 0.1 10*3/uL (ref 0.0–0.1)
Basophils Relative: 1 %
Eosinophils Absolute: 0.2 10*3/uL (ref 0.0–0.5)
Eosinophils Relative: 3 %
HCT: 41.9 % (ref 39.0–52.0)
Hemoglobin: 14.5 g/dL (ref 13.0–17.0)
Immature Granulocytes: 0 %
Lymphocytes Relative: 30 %
Lymphs Abs: 2 10*3/uL (ref 0.7–4.0)
MCH: 31.6 pg (ref 26.0–34.0)
MCHC: 34.6 g/dL (ref 30.0–36.0)
MCV: 91.3 fL (ref 80.0–100.0)
Monocytes Absolute: 0.6 10*3/uL (ref 0.1–1.0)
Monocytes Relative: 9 %
Neutro Abs: 3.9 10*3/uL (ref 1.7–7.7)
Neutrophils Relative %: 57 %
Platelet Count: 306 10*3/uL (ref 150–400)
RBC: 4.59 MIL/uL (ref 4.22–5.81)
RDW: 12.9 % (ref 11.5–15.5)
WBC Count: 6.8 10*3/uL (ref 4.0–10.5)
nRBC: 0 % (ref 0.0–0.2)

## 2022-03-23 LAB — FERRITIN: Ferritin: 30 ng/mL (ref 24–336)

## 2022-03-23 NOTE — Progress Notes (Signed)
?  [facility] ?OFFICE PROGRESS NOTE ? ? ?Diagnosis: HHT, iron deficiency ? ?INTERVAL HISTORY:  ? ?Gregory [name] returns as scheduled. 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  These areas are slowly healing.  He is scheduled to see dermatology today.  He continues to have intermittent bleeding from the nose and mouth.  No apparent GI bleeding.  He continues iron. ?He underwent a TUR procedure by Dr. Rosana [name] 03/02/2022.  His urination has improved.                                          ? ?Objective: ? ?Vital signs in last 24 hours: ? ?Blood pressure 129/65, pulse 71, temperature 97.8 ?F (36.6 ?C), temperature source Oral, resp. rate 20, height '5\' 10"'$  (1.778 m), weight 184 lb 9.6 oz (83.7 kg), SpO2 100 %. ?  ? ?HEENT: Multiple telangiectasias in the mouth ?Resp: Lungs clear bilaterally ?Cardio: Regular rate and rhythm ?GI: No hepatosplenomegaly ?Vascular: No leg edema  ?Skin: Multiple telangiectasias over the face, healing abrasions at the parietal scalp ? ? ?Lab Results: ? ?Lab Results  ?Component Value Date  ? WBC 6.8 03/23/2022  ? HGB 14.5 03/23/2022  ? HCT 41.9 03/23/2022  ? MCV 91.3 03/23/2022  ? PLT 306 03/23/2022  ? NEUTROABS 3.9 03/23/2022  ? ? ?CMP  ?Lab Results  ?Component Value Date  ? NA 132 (L) 02/16/2022  ? K 4.6 02/16/2022  ? CL 94 (L) 02/16/2022  ? CO2 26 02/16/2022  ? GLUCOSE 101 (H) 02/16/2022  ? BUN 13 02/16/2022  ? CREATININE 0.98 02/16/2022  ? CALCIUM 9.5 02/16/2022  ? PROT 6.5 02/16/2022  ? ALBUMIN 4.7 02/16/2022  ? AST 17 02/16/2022  ? ALT 16 02/16/2022  ? ALKPHOS 72 02/16/2022  ? BILITOT 0.4 02/16/2022  ? GFRNONAA >60 08/23/2021  ? GFRAA >60 06/01/2020  ? ? ?Medications: I have reviewed the patient's current medications. ? ? 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 ? ? ? ?Disposition: ?[name] appears stable.  The hemoglobin remains in the normal range.  We will follow-up on the ferritin level from today.  He will return for a lab visit in 3 months and an office visit in 6 months.  He will continue oral iron therapy. ? ?Betsy Coder, MD ? ?03/23/2022  ?9:44 AM ? ? ?

## 2022-04-06 DIAGNOSIS — Z9079 Acquired absence of other genital organ(s): Secondary | ICD-10-CM | POA: Insufficient documentation

## 2022-04-26 ENCOUNTER — Ambulatory Visit: Payer: Medicare Other | Admitting: Internal Medicine

## 2022-04-26 ENCOUNTER — Ambulatory Visit (INDEPENDENT_AMBULATORY_CARE_PROVIDER_SITE_OTHER): Payer: Medicare Other | Admitting: Nurse Practitioner

## 2022-04-26 ENCOUNTER — Encounter (HOSPITAL_BASED_OUTPATIENT_CLINIC_OR_DEPARTMENT_OTHER): Payer: Self-pay | Admitting: Nurse Practitioner

## 2022-04-26 VITALS — BP 130/80 | HR 73 | Ht 70.0 in | Wt 185.7 lb

## 2022-04-26 DIAGNOSIS — I780 Hereditary hemorrhagic telangiectasia: Secondary | ICD-10-CM

## 2022-04-26 DIAGNOSIS — I5032 Chronic diastolic (congestive) heart failure: Secondary | ICD-10-CM

## 2022-04-26 DIAGNOSIS — G629 Polyneuropathy, unspecified: Secondary | ICD-10-CM | POA: Diagnosis not present

## 2022-04-26 DIAGNOSIS — E098 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: Secondary | ICD-10-CM | POA: Insufficient documentation

## 2022-04-26 DIAGNOSIS — E039 Hypothyroidism, unspecified: Secondary | ICD-10-CM | POA: Diagnosis not present

## 2022-04-26 DIAGNOSIS — G620 Drug-induced polyneuropathy: Secondary | ICD-10-CM

## 2022-04-26 DIAGNOSIS — R053 Chronic cough: Secondary | ICD-10-CM

## 2022-04-26 DIAGNOSIS — Z23 Encounter for immunization: Secondary | ICD-10-CM

## 2022-04-26 DIAGNOSIS — I700 Atherosclerosis of aorta: Secondary | ICD-10-CM

## 2022-04-26 DIAGNOSIS — K219 Gastro-esophageal reflux disease without esophagitis: Secondary | ICD-10-CM

## 2022-04-26 DIAGNOSIS — E222 Syndrome of inappropriate secretion of antidiuretic hormone: Secondary | ICD-10-CM

## 2022-04-26 DIAGNOSIS — Z9079 Acquired absence of other genital organ(s): Secondary | ICD-10-CM

## 2022-04-26 DIAGNOSIS — J849 Interstitial pulmonary disease, unspecified: Secondary | ICD-10-CM

## 2022-04-26 HISTORY — DX: 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: E09.8

## 2022-04-26 NOTE — Assessment & Plan Note (Signed)
No signs of bleeding present at this time. Will continue to monitor closely. Most recent labs appear stable. Will repeat labs today.  ?

## 2022-04-26 NOTE — Assessment & Plan Note (Signed)
Doing well after surgery.  He has had to be started on Rapaflo which he was hoping the procedure was prevent him from having to do.  He is hopeful that symptoms will improve and he will be able to eventually come off the medication.  No alarm symptoms present today.  We will repeat some labs today.  Reviewed labs with patient from recent visit with urology. ?

## 2022-04-26 NOTE — Assessment & Plan Note (Signed)
Chronic.  Recent labs reviewed.  Sodium levels only moderately decreased at this time.  Recommend patient continue with increased salt intake however monitor for signs of fluid volume overload as he does also have congestive heart failure.  At this time there are no alarm signs present.  We will continue to monitor. ?

## 2022-04-26 NOTE — Progress Notes (Signed)
?Worthy Keeler, DNP, AGNP-c ?[facility] ?[address] ?[address] ?[place], [place] [address] ?[phone] Office [phone] Fax ? ?ESTABLISHED PATIENT- Chronic Health and/or Follow-Up Visit ? ?Blood pressure 130/80, pulse 73, height '5\' 10"'$  (1.778 m), weight 185 lb 11.2 oz (84.2 kg), SpO2 97 %. ? ?Follow-up (Needs refills on Singulair, pantoprazole, gabapentin) ? ? ?HPI ? ?Gregory [name]  is a 81 y.o. 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  ?Will finish treatment in 21 days ?No concerns ?Hypothyroidism ?Taking levothyroxine daily ?No missed doses ?No new symptoms ?HTN ?BP stable ?No CP, ShOB, palpitations, dizziness, HA, vision changes ?Cough ?Chronic "tickle" in the back of the throat causing a cough ?Has seen ENT in the past and they could not find a cause ?Chronic blockage of left nostril ?Does not feel like allergies are the issue ?Was told in the past it could be from nerves ?Taking gabapentin does help ?Higher doses of gabapentin cause extreme fatigue ?Taking robitussin soft gel daily, which is very helpful ? ? ?ROS ?All ROS negative with exception of what is listed in HPI ? ?PHYSICAL EXAM ?Physical Exam ?Vitals and nursing note reviewed.  ?Constitutional:   ?   Appearance: Normal appearance.  ?HENT:  ?   Head: Normocephalic.  ?Eyes:  ?   Extraocular Movements: Extraocular movements intact.  ?   Conjunctiva/sclera: Conjunctivae normal.  ?   Pupils: Pupils are equal, round, and reactive to light.  ?Neck:  ?   Vascular: No  carotid bruit.  ?Cardiovascular:  ?   Rate and Rhythm: Normal rate and regular rhythm.  ?   Pulses: Normal pulses.  ?   Heart sounds: Normal heart sounds.  ?Pulmonary:  ?   Effort: Pulmonary effort is normal.  ?   Breath sounds: Normal breath sounds.  ?Abdominal:  ?   General: Bowel sounds are normal.  ?   Palpations: Abdomen is soft.  ?Musculoskeletal:  ?   Cervical back: Normal range of motion.  ?   Right lower leg: No edema.  ?   Left lower leg: No edema.  ?Skin: ?   General: Skin is warm and dry.  ?   Capillary Refill: Capillary refill takes less than 2 seconds.  ?Neurological:  ?   General: No focal deficit present.  ?   Mental Status: He is alert and oriented to person, place, and time.  ?Psychiatric:     ?   Mood and Affect: Mood normal.     ?   Behavior: Behavior normal.     ?   Thought Content: Thought content normal.     ?   Judgment: Judgment normal.  ? ? ?ASSESSMENT & PLAN ?Problem List Items Addressed This Visit   ? ? Hypothyroidism  ?  Chronic.  Historically stable.  We will obtain labs today for further evaluation and make recommendations to changes in plan of care based on lab findings. ? ?  ?  ? Relevant Orders  ? TSH  ? T4, free  ? 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  No signs of bleeding present at this time. Will  continue to monitor closely. Most recent labs appear stable. Will repeat labs today.  ? ?  ?  ? 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  Chronic neuropathic symptoms present.  Moderate control with gabapentin.  Unfortunately patient is unable to tolerate higher doses of gabapentin therefore we will continue the current dose of 100 mg twice a day.  If symptoms worsen patient is aware he may reach out for further recommendations. ? ?  ?  ? Laryngopharyngeal reflux (LPR)  ?  Chronic cough in the setting of reflux and suspected neuropathic throat concerns.  At this time he is on high-dose PPI twice a day in addition to Tums as needed.  Still experiencing symptoms of reflux and increased  gas.  Consider repeat evaluation with gastroenterology if symptoms continue or worsen.  At this time he is having some relief with use of gabapentin and Robitussin.  Discussed monitoring blood pressure closely while taking Robitussin however it does not appear to be causing any significant changes therefore okayed patient to continue taking this.  We will monitor closely. ? ?  ?  ? Chronic cough  ?  Chronic.  Unclear if etiology is due to laryngeal pharyngeal reflux or neuropathic symptoms or possibly combination of both.  Recommend continuation of gabapentin and pantoprazole for management.  Okay for patient to continue taking Robitussin at this time but monitor blood pressure closely and ensure that this is not elevated while taking medication.  He will follow-up if symptoms worsen or fail to improve. ? ?  ?  ? SIADH (syndrome of inappropriate ADH production) ([place])  ?  Chronic.  Recent labs reviewed.  Sodium levels only moderately decreased at this time.  Recommend patient continue with increased salt intake however monitor for signs of fluid volume overload as he does also have congestive heart failure.  At this time there are no alarm signs present.  We will continue to monitor. ? ?  ?  ? Relevant Orders  ? Comprehensive metabolic panel  ? Atherosclerosis of aorta ([place])  ?  Chronic. Patient is statin intolerant. Recommend healthy eating habits with low cholesterol diet and daily exercise to help reduce risks. No alarm sx present today. Will monitor closely and make recommendations to plan of care based on changes. Continue to collaborate with cardiology.  ? ?  ?  ? Chronic diastolic heart failure ([place])  ?  Chronic.  Patient appears euvolemic today with no alarm signs or symptoms present at this time.  Most recent labs reviewed today which show that sodium is only just mildly low.  Despite diagnosis of chronic diastolic heart failure recommend increasing sodium intake in the diet as he does have comorbidities  with SIADH.  Patient aware of warning signs that would warrant further evaluation. ?We will plan follow-up in 6 months or sooner if needed. ? ?  ?  ? Relevant Orders  ? Lipid panel  ? S/P TURP  ?  Doing well after surgery.  He has had to be started on Rapaflo which he was hoping the procedure was prevent him from having to do.  He is hopeful that symptoms will improve and he will be able to eventually come off the medication.  No alarm symptoms present today.  We will repeat some labs today.  Reviewed labs with patient from recent visit with urology. ? ?  ?  ? 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  Chronic.  No alarm symptoms  present today.  Recent review of labs show no significant elevation in blood sugars.  We will obtain further labs today for further evaluation. ? ?  ?  ? Interstitial lung disease ([place])  ?  Chronic.  No alarm symptoms present today.  Lungs do appear clear.  Chronic cough likely related to laryngeal etiology as opposed to lung etiology based on exam and symptoms present.  Recommend continue to monitor closely.  If symptoms worsen or new symptoms develop we will plan for pulmonology referral. ? ?  ?  ? ?Other Visit Diagnoses   ? ? Pneumococcal vaccine administered      ? Relevant Orders  ? Pneumococcal conjugate vaccine 20-valent (Prevnar 20) (Completed)  ? ?  ? ? ? ?FOLLOW-UP ?Return in about 6 months (around 10/27/2022). ? ? ?Worthy Keeler, DNP, AGNP-c ?04/26/2022  1:14 PM ?

## 2022-04-26 NOTE — Assessment & Plan Note (Signed)
Chronic cough in the setting of reflux and suspected neuropathic throat concerns.  At this time he is on high-dose PPI twice a day in addition to Tums as needed.  Still experiencing symptoms of reflux and increased gas.  Consider repeat evaluation with gastroenterology if symptoms continue or worsen.  At this time he is having some relief with use of gabapentin and Robitussin.  Discussed monitoring blood pressure closely while taking Robitussin however it does not appear to be causing any significant changes therefore okayed patient to continue taking this.  We will monitor closely. ?

## 2022-04-26 NOTE — Assessment & Plan Note (Signed)
Chronic.  Historically stable.  We will obtain labs today for further evaluation and make recommendations to changes in plan of care based on lab findings. ?

## 2022-04-26 NOTE — Patient Instructions (Addendum)
It was a pleasure seeing you today. I hope your time spent with [place] was pleasant and helpful. Please let us know if there is anything we can do to improve the service you receive.  ? ?We will get some labs today and make sure everything looks ok.  ?I will do some research to see if I can come up with any ideas about the cough ? ?The following orders have been placed for you today: ? ?Orders Placed This Encounter  ?Procedures  ? Varicella-zoster vaccine subcutaneous  ? Pneumococcal polysaccharide vaccine 23-valent greater than or equal to 2yo subcutaneous/IM  ? Ambulatory referral to Physical Therapy  ?  Referral Priority:   Routine  ?  Referral Type:   Physical Medicine  ?  Referral Reason:   Specialty Services Required  ?  Requested Specialty:   Physical Therapy  ?  Number of Visits Requested:   1  ? ? ? ?Important Office Information ?Lab Results ?If labs were ordered, please note that you will see results through [facility] as soon as they come available from [facility].  ?It takes up to 5 business days for the results to be routed to me and for me to review them once all of the lab results have come through from [facility]. I will make recommendations based on your results and send these through [place] or someone from the office will call you to discuss. If your labs are abnormal, we may contact you to schedule a visit to discuss the results and make recommendations.  ?If you have not heard from [place] within 5 business days or you have waited longer than a week and your lab results have not come through on [place], please feel free to call the office or send a message through [place] to follow-up on these labs.  ? ?Referrals ?If referrals were placed today, the office where the referral was sent will contact you either by phone or through [place] to set up scheduling. Please note that it can take up to a week for the referral office to contact you. If you do not hear from them in a week, please contact the referral office  directly to inquire about scheduling.  ? ?Condition Treated ?If your condition worsens or you begin to have new symptoms, please schedule a follow-up appointment for further evaluation. If you are not sure if an appointment is needed, you may call the office to leave a message for the nurse and someone will contact you with recommendations.  ?If you have an urgent or life threatening emergency, please do not call the office, but seek emergency evaluation by calling 911 or going to the nearest emergency room for evaluation.  ? ?MyChart and Phone Calls ?Please do not use MyChart for urgent messages. It may take up to 3 business days for MyChart messages to be read by staff and if they are unable to handle the request, an additional 3 business days for them to be routed to me and for my response.  ?Messages sent to the provider through [place] do not come directly to the provider, please allow time for these messages to be routed and for me to respond.  ?We get a large volume of MyChart messages daily and these are responded to in the order received.  ? ?For urgent messages, please call the office at [phone] and speak with the front office staff or leave a message on the line of my assistant for guidance.  ?We are seeing patients from the  hours of 8:00 am through 5:00 pm and calls directly to the nurse may not be answered immediately due to seeing patients, but your call will be returned as soon as possible.  ?Phone  messages received after 4:00 PM Monday through Thursday may not be returned until the following business day. Phone messages received after 11:00 AM on Friday may not be returned until Monday.  ? ?After Hours ?We share on call hours with providers from other offices. If you have an urgent need after hours that cannot wait until the next business day, please contact the on call provider by calling the office number. A nurse will speak with you and contact the provider if needed for recommendations.   ?If you have an urgent or life threatening emergency after hours, please do not call the on call provider, but seek emergency evaluation by calling 911 or going to the nearest emergency room for evaluation.  ? ?Paperwork ?All paperwork requires a minimum of 5 days to complete and return to you or the designated personnel. Please keep this in mind when bringing in forms or sending requests for paperwork completion to the office.  ?  ?

## 2022-04-26 NOTE — Assessment & Plan Note (Signed)
Chronic. Patient is statin intolerant. Recommend healthy eating habits with low cholesterol diet and daily exercise to help reduce risks. No alarm sx present today. Will monitor closely and make recommendations to plan of care based on changes. Continue to collaborate with cardiology.  ?

## 2022-04-26 NOTE — Assessment & Plan Note (Signed)
Chronic.  No alarm symptoms present today.  Recent review of labs show no significant elevation in blood sugars.  We will obtain further labs today for further evaluation. ?

## 2022-04-26 NOTE — Assessment & Plan Note (Addendum)
Chronic.  Unclear if etiology is due to laryngeal pharyngeal reflux or neuropathic symptoms or possibly combination of both.  Recommend continuation of gabapentin and pantoprazole for management.  Okay for patient to continue taking Robitussin at this time but monitor blood pressure closely and ensure that this is not elevated while taking medication.  He will follow-up if symptoms worsen or fail to improve. ?

## 2022-04-26 NOTE — Assessment & Plan Note (Signed)
Chronic neuropathic symptoms present.  Moderate control with gabapentin.  Unfortunately patient is unable to tolerate higher doses of gabapentin therefore we will continue the current dose of 100 mg twice a day.  If symptoms worsen patient is aware he may reach out for further recommendations. ?

## 2022-04-26 NOTE — Assessment & Plan Note (Signed)
Chronic.  Patient appears euvolemic today with no alarm signs or symptoms present at this time.  Most recent labs reviewed today which show that sodium is only just mildly low.  Despite diagnosis of chronic diastolic heart failure recommend increasing sodium intake in the diet as he does have comorbidities with SIADH.  Patient aware of warning signs that would warrant further evaluation. ?We will plan follow-up in 6 months or sooner if needed. ?

## 2022-04-26 NOTE — Assessment & Plan Note (Signed)
Chronic.  No alarm symptoms present today.  Lungs do appear clear.  Chronic cough likely related to laryngeal etiology as opposed to lung etiology based on exam and symptoms present.  Recommend continue to monitor closely.  If symptoms worsen or new symptoms develop we will plan for pulmonology referral. ?

## 2022-04-27 LAB — COMPREHENSIVE METABOLIC PANEL
ALT: 15 IU/L (ref 0–44)
AST: 19 IU/L (ref 0–40)
Albumin/Globulin Ratio: 2.2 (ref 1.2–2.2)
Albumin: 4.6 g/dL (ref 3.7–4.7)
Alkaline Phosphatase: 74 IU/L (ref 44–121)
BUN/Creatinine Ratio: 11 (ref 10–24)
BUN: 10 mg/dL (ref 8–27)
Bilirubin Total: 0.3 mg/dL (ref 0.0–1.2)
CO2: 26 mmol/L (ref 20–29)
Calcium: 9.5 mg/dL (ref 8.6–10.2)
Chloride: 91 mmol/L — ABNORMAL LOW (ref 96–106)
Creatinine, Ser: 0.95 mg/dL (ref 0.76–1.27)
Globulin, Total: 2.1 g/dL (ref 1.5–4.5)
Glucose: 100 mg/dL — ABNORMAL HIGH (ref 70–99)
Potassium: 4.8 mmol/L (ref 3.5–5.2)
Sodium: 128 mmol/L — ABNORMAL LOW (ref 134–144)
Total Protein: 6.7 g/dL (ref 6.0–8.5)
eGFR: 81 mL/min/{1.73_m2} (ref >59)

## 2022-04-27 LAB — LIPID PANEL
Chol/HDL Ratio: 4 ratio (ref 0.0–5.0)
Cholesterol, Total: 148 mg/dL (ref 100–199)
HDL: 37 mg/dL — ABNORMAL LOW (ref >39)
LDL Chol Calc (NIH): 85 mg/dL (ref 0–99)
Triglycerides: 150 mg/dL — ABNORMAL HIGH (ref 0–149)
VLDL Cholesterol Cal: 26 mg/dL (ref 5–40)

## 2022-04-27 LAB — TSH: TSH: 5.43 u[IU]/mL — ABNORMAL HIGH (ref 0.450–4.500)

## 2022-04-27 LAB — T4, FREE: Free T4: 1.41 ng/dL (ref 0.82–1.77)

## 2022-04-28 ENCOUNTER — Encounter (HOSPITAL_BASED_OUTPATIENT_CLINIC_OR_DEPARTMENT_OTHER): Payer: Self-pay | Admitting: Nurse Practitioner

## 2022-05-03 ENCOUNTER — Other Ambulatory Visit (HOSPITAL_BASED_OUTPATIENT_CLINIC_OR_DEPARTMENT_OTHER): Payer: Self-pay

## 2022-05-03 DIAGNOSIS — Z23 Encounter for immunization: Secondary | ICD-10-CM

## 2022-05-06 ENCOUNTER — Other Ambulatory Visit (HOSPITAL_BASED_OUTPATIENT_CLINIC_OR_DEPARTMENT_OTHER): Payer: Self-pay | Admitting: Nurse Practitioner

## 2022-05-06 DIAGNOSIS — E039 Hypothyroidism, unspecified: Secondary | ICD-10-CM

## 2022-05-06 MED ORDER — LEVOTHYROXINE SODIUM 150 MCG PO TABS: 150.0000 ug | ORAL_TABLET | Freq: Every day | ORAL | 3 refills | Status: DC

## 2022-05-21 ENCOUNTER — Ambulatory Visit (HOSPITAL_BASED_OUTPATIENT_CLINIC_OR_DEPARTMENT_OTHER): Payer: Medicare Other | Attending: Nurse Practitioner | Admitting: Physical Therapy

## 2022-05-21 ENCOUNTER — Encounter (HOSPITAL_BASED_OUTPATIENT_CLINIC_OR_DEPARTMENT_OTHER): Payer: Self-pay | Admitting: Physical Therapy

## 2022-05-21 DIAGNOSIS — G629 Polyneuropathy, unspecified: Secondary | ICD-10-CM | POA: Insufficient documentation

## 2022-05-21 DIAGNOSIS — R2681 Unsteadiness on feet: Secondary | ICD-10-CM | POA: Diagnosis present

## 2022-05-21 DIAGNOSIS — R262 Difficulty in walking, not elsewhere classified: Secondary | ICD-10-CM | POA: Diagnosis present

## 2022-05-21 DIAGNOSIS — M6281 Muscle weakness (generalized): Secondary | ICD-10-CM | POA: Insufficient documentation

## 2022-05-21 NOTE — Therapy (Signed)
OUTPATIENT PHYSICAL THERAPY LOWER EXTREMITY EVALUATION   Patient Name: Gregory [name] MRN: [id number] DOB:[birth year], 81 y.o., male Today's Date: 05/21/2022   PT End of Session - 05/21/22 1158     Visit Number 1    Number of Visits 19    Date for PT Re-Evaluation 08/19/22    Authorization Type MCR    PT Start Time 3007    PT Stop Time 1100    PT Time Calculation (min) 45 min             Past Medical History:  Diagnosis Date   Anxiety state, unspecified    Arthritis    BACK PAIN, LUMBAR 02/08/2008   Qualifier: Diagnosis of  By: Julien Girt CMA, Leigh     Benign neoplasm of colon    Bilateral leg edema 03/19/2021   GERD (gastroesophageal reflux disease)    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  WITHOUT HEMOPTYSIS   History of nonmelanoma skin cancer EXCISION SQUAMOUS CELL FROM HAND   Hypertrophy of prostate with urinary obstruction and other lower urinary tract symptoms (LUTS)    Irritable bowel syndrome    Lumbago    Other diseases of nasal cavity and sinuses(478.19)    Persistent disorder of initiating or maintaining sleep    Plantar fascial fibromatosis    Pure hypercholesterolemia    Telangiectasia, hereditary hemorrhagic, of Rendu, 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  ([facility])   SKIN, LIPS, NASAL W/ PREVIOUS NOSE BLEEDS  AMD GI TELANGIECTASIA   Unspecified hypothyroidism    Past Surgical History:  Procedure Laterality Date   APPLICATION OF CRANIAL NAVIGATION N/A 05/16/2015   Procedure: APPLICATION OF CRANIAL NAVIGATION;  Surgeon: Consuella Lose, MD;  Location: [facility];  Service: Neurosurgery;  Laterality: N/A;   BRAIN BIOPSY Left 05/16/2015   Procedure: Stereotactic Left Brain Biopsy with Brain Lab;  Surgeon: Consuella Lose, MD;  Location: [facility];  Service: Neurosurgery;  Laterality: Left;  Stereotactic Left Brain biopsy with brainlab   BRAIN SURGERY     CARDIOVASCULAR STRESS TEST  01-14-2003   NO ISCHEMIA / EF 61%/ NORMAL LE WALL  MOTION   EXCISION LEFT WRIST GANGLIAN/ MYXOID CYST  05-30-2009   IR RADIOLOGIST EVAL & MGMT  02/01/2019   NASAL SINUS SURGERY     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 Renda Rolls   TRANSTHORACIC ECHOCARDIOGRAM  10-17-2008   DR CRENSHAW   NORMAL LVF/ EF 60%/  MILDLY DILATED RIGHT ATRIUM/ VENTRICULE   VARICOCELECTOMY  1991   Dr. 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 ([place]) 62/26/3335   Interstitial lung disease ([place]) 04/26/2022   S/P TURP 04/06/2022   Chronic diastolic heart failure ([place]) 02/16/2022   Hemangioma 02/15/2022   History of malignant neoplasm of skin 02/15/2022   Lentigo 02/15/2022   Melanocytic nevi of trunk 02/15/2022   Other seborrheic keratosis 02/15/2022   Rosacea 02/15/2022   Urinary urgency 01/12/2022   Spondylolisthesis, lumbar region 12/15/2021   Trochanteric bursitis of left hip 12/15/2021   Symptomatic anemia 08/22/2021   Nonrheumatic mitral valve regurgitation 03/19/2021   Osteoarthritis of acromioclavicular joint 11/30/2020   SBE (subacute bacterial endocarditis) prophylaxis candidate 10/23/2020   Atherosclerosis of aorta ([place]) 10/23/2020  Prediabetes 10/23/2020   SIADH (syndrome of inappropriate ADH production) ([place]) 04/23/2020   Chronic hyponatremia 10/24/2019   Chronic cough 10/09/2019   Bilateral sensorineural hearing loss 02/22/2019   Pain in joint of left shoulder 02/01/2019   Laryngopharyngeal reflux (LPR) 01/29/2019   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   Pain in joint of right hip 07/11/2018   Seasonal allergic rhinitis due to pollen 06/27/2018   Neuropathy due to drug [facility])    Encounter for therapeutic drug monitoring     Osteoarthritis 04/01/2015   Iron deficiency anemia due to chronic blood loss 10/08/2014   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 40/81/4481   Nocturnal leg cramps 06/11/2013   Dizziness 09/17/2008   HYPERCHOLESTEROLEMIA, MILD 07/25/2008   CARCINOMA, SKIN, SQUAMOUS CELL 02/10/2008   INSOMNIA, CHRONIC 02/10/2008   GERD 02/10/2008   Benign prostatic hyperplasia with urinary obstruction 02/10/2008   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 Pt states that uneven surfaces also cause issues with his mobility. He states he has to look at his feet in order to maintain his balance. Pt did previous PT episode for balance and that seemed to helped. Pt states he walks for exercise 20-30 mins 2x/week. Pt states he is here to prevent falls as he notices his balance is getting worse. Pt states he did have a fall about a week ago where he caught his toe on the curb and fell into the door. Denies any serious injuries.   Pt denies signs of vertigo or dizziness. Pt denies focal changes in 5Ds 3Ns. 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 No  PRECAUTIONS: None  WEIGHT BEARING RESTRICTIONS No  FALLS:  Has patient fallen in last 6 months? Yes. Number of falls 1  LIVING ENVIRONMENT: Lives with: lives with their spouse Lives in: House/apartment Stairs: Yes: Internal: 13 steps Has following equipment at home: None  OCCUPATION: retired Programmer, systems  Enjoys: birding, yard work, music  PLOF: Independent with basic ADLs  PATIENT GOALS : Prevent future falls.    OBJECTIVE:   DIAGNOSTIC FINDINGS: N/A  PATIENT SURVEYS:  ABC scale Total ABC score: 1340 / 1600 = 83.8 %  COGNITION:  Overall cognitive status: Within functional limits for tasks assessed     SENSATION: Light touch: Impaired    Neuropathy to mid shin    LOWER EXTREMITY ROM:  Active ROM Right eval Left eval  Hip flexion Limited by back pain Limited by back pain  Hip extension 0 0  Hip external rotation Limited by back pain Limited by back pain  Knee flexion    Knee extension    Ankle dorsiflexion [facility]  Ankle plantarflexion WFL WFL   (Blank rows = not tested)    FUNCTIONAL TESTS:  5 times sit to stand: 12.8s Timed up and go (TUG): 11.2s Berg Balance Scale = 43/56  GAIT: Distance walked: 35f Assistive device utilized: None Level of assistance: Complete Independence Comments: fwd flexed posture, shortened step length, downward gaze  Retro walk: posterior weight shift with near LOB Change of direction: hesitancy, choppy steps, stumble with L and R turn    TODAY'S TREATMENT:  Exercises - Heel Raises with Counter Support  - 2 x daily - 7 x weekly - 2 sets - 10 reps - Side Stepping with Counter Support  - 2 x daily - 7 x weekly - 1 sets - 4 reps - 263fhold - Tandem Walking with Counter Support  - 1 x daily - 7 x weekly - 3 sets - 10 reps   PATIENT EDUCATION:  Education details: diagnosis, prognosis, anatomy, exercise progression, DOMS expectations, muscle firing,  envelope of function,  HEP, POC  Person educated: Patient Education method: Explanation, Demonstration, Tactile cues, Verbal cues, and Handouts Education comprehension: verbalized understanding, returned demonstration, verbal cues required, and tactile cues required   HOME EXERCISE PROGRAM: Access Code: [email or web address] URL: [email or web address] Date: 05/21/2022 Prepared by: AlDaleen BoASSESSMENT:  CLINICAL IMPRESSION: Patient is a [age].o. male who was seen today for physical therapy evaluation and treatment for c/c of trouble walking and peripheral neuropathy. Pt has good static balance is very close to [employer]-off; likely due to ceiling affect. May consider FGA or Mini-Best for future assessments as pt's largest deficits are with more dynamic movements, change of direction, and dynamic gait. Pt would benefit from continued skilled therapy in order to reach goals and maximize functional LE strength and balance for prevention of further functional decline.    OBJECTIVE IMPAIRMENTS Abnormal gait, cardiopulmonary status limiting activity, decreased activity tolerance, decreased balance, decreased coordination, decreased endurance, decreased knowledge of use of DME, decreased mobility, difficulty walking, decreased ROM, decreased strength, hypomobility, impaired sensation, improper body mechanics, and postural dysfunction.   ACTIVITY LIMITATIONS carrying, lifting, standing, squatting, stairs, transfers, and locomotion level  PARTICIPATION LIMITATIONS: meal prep, cleaning, laundry, driving, shopping, community activity, and yard work  PERSONAL FACTORS Age, Behavior pattern, Fitness, Time since onset of injury/illness/exacerbation, and 3+ comorbidities:    are also affecting patient's functional outcome.   REHAB POTENTIAL: Fair    CLINICAL DECISION MAKING: Evolving/moderate complexity  EVALUATION COMPLEXITY: Moderate   GOALS: SHORT TERM GOALS: Target date: 07/02/2022   Pt will become  independent with HEP in order to demonstrate synthesis of PT education.   Goal status: INITIAL  2.  Pt will be able to demonstrate ability to retro walk 103fithout LOB in order to demonstrate functional improvement in balance for community and home mobility.   Goal status: INITIAL     LONG TERM GOALS: Target date: 08/13/2022   Pt  will become independent with final HEP in order to demonstrate synthesis of PT education.   Goal status: INITIAL  2.  Pt will be able to demonstrate TUG in under 10 sec in order to demonstrate functional improvement in LE function, strength, balance, and mobility for safety with community ambulation.   Goal status: INITIAL  3.  Pt will be able to perform 5XSTS in under 12s  in order to demonstrate functional improvement above the cut off score for adults.   Goal status: INITIAL  4.  Pt will have an at least 45/56 on  Berg Balance scale in order to demonstrate improvement above cut off score for risk of falls.     Goal status: INITIAL  5. Pt will be able to demonstrate dynamic  change of direction during gait without LOB in order to demonstrate functional improvement in balance for prevention of falls during community ambulation.    Goal status: INITIAL   PLAN: PT FREQUENCY: 1-2x/week  PT DURATION: 12 weeks (likely DC in 10 wks)  PLANNED INTERVENTIONS: Therapeutic exercises, Therapeutic activity, Neuromuscular re-education, Balance training, Gait training, Patient/Family education, Joint manipulation, Joint mobilization, Stair training, Vestibular training, Orthotic/Fit training, DME instructions, Aquatic Therapy, Cryotherapy, Moist heat, Taping, Vasopneumatic device, Ultrasound, Manual therapy, and Re-evaluation  PLAN FOR NEXT SESSION: dynamic balance, progress to aquatic   Daleen Bo, PT 05/21/2022, 12:15 PM

## 2022-05-24 ENCOUNTER — Ambulatory Visit (HOSPITAL_BASED_OUTPATIENT_CLINIC_OR_DEPARTMENT_OTHER): Payer: Medicare Other | Admitting: Physical Therapy

## 2022-05-26 DIAGNOSIS — N35014 Post-traumatic urethral stricture, male, unspecified: Secondary | ICD-10-CM | POA: Insufficient documentation

## 2022-06-06 ENCOUNTER — Telehealth: Payer: Self-pay | Admitting: Physician Assistant

## 2022-06-06 NOTE — Telephone Encounter (Signed)
   The patient called the answering service after-hours today. He is away in [place] visiting. Noticed pulse running higher, 140 persistently. BP OK. 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 Feeling a little dizzy with this. Not on any AVN blocking agents or anticoagulation at home. Recommended proceeding to local ER for EKG and further management of suspect arrhythmia -would be concerned he's back in flutter. He agrees with this plan and knows not to drive himself. Will route to Dr. Stanford Breed as Juluis Rainier.  Charlie Pitter, PA-C

## 2022-06-07 ENCOUNTER — Other Ambulatory Visit: Payer: Self-pay

## 2022-06-07 ENCOUNTER — Telehealth: Payer: Self-pay | Admitting: Cardiology

## 2022-06-07 MED ORDER — METOPROLOL TARTRATE 25 MG PO TABS: ORAL_TABLET | ORAL | 0 refills | Status: DC

## 2022-06-07 NOTE — Telephone Encounter (Signed)
Cancelled C. Walker and scheduled with Arnold Long 6/23

## 2022-06-07 NOTE — Telephone Encounter (Signed)
Looks like there may be some available 72 hour holds with APP at [facility] if needed as he is familiar with that office. Welcome to use one of my TOC or 72 hour hold if needed for sooner appt.   Loel Dubonnet, NP

## 2022-06-07 NOTE — Telephone Encounter (Signed)
Patient stated while in [place], he has dizziness and sob. He went to the Ed; afib 140 and received fluids. He said he is staying hydrated. His pulse today is 72. He wanted medication to prevent afib. Dr. Ellyn Hack (DOD) ordered metoprolol tartrate 12.5 mg as needed. Patient informed and voiced understanding. Prescription sent. He has appointment with Vella Raring on 7/5.

## 2022-06-07 NOTE — Telephone Encounter (Signed)
STAT if HR is under 50 or over 120 (normal HR is 60-100 beats per minute)  What is your heart rate? Yesterday it 140 , he went to the Er- he was told ti now 76, 103, 104   Do you have a log of your heart rate readings (document readings)?  Do you have any other symptomdizziness and lightheaded at the time when his heart rate was high--no symptoms at this times. Patient wants to be seen asap- first available appointment is 06-23-22 with [facility]

## 2022-06-08 ENCOUNTER — Other Ambulatory Visit (HOSPITAL_BASED_OUTPATIENT_CLINIC_OR_DEPARTMENT_OTHER): Payer: Self-pay | Admitting: Nurse Practitioner

## 2022-06-08 DIAGNOSIS — R030 Elevated blood-pressure reading, without diagnosis of hypertension: Secondary | ICD-10-CM

## 2022-06-09 ENCOUNTER — Encounter (HOSPITAL_BASED_OUTPATIENT_CLINIC_OR_DEPARTMENT_OTHER): Payer: Self-pay | Admitting: Physical Therapy

## 2022-06-09 ENCOUNTER — Encounter (HOSPITAL_BASED_OUTPATIENT_CLINIC_OR_DEPARTMENT_OTHER): Payer: Self-pay

## 2022-06-09 ENCOUNTER — Ambulatory Visit (HOSPITAL_BASED_OUTPATIENT_CLINIC_OR_DEPARTMENT_OTHER): Payer: Medicare Other | Admitting: Physical Therapy

## 2022-06-09 DIAGNOSIS — M6281 Muscle weakness (generalized): Secondary | ICD-10-CM

## 2022-06-09 DIAGNOSIS — R262 Difficulty in walking, not elsewhere classified: Secondary | ICD-10-CM

## 2022-06-09 DIAGNOSIS — R2681 Unsteadiness on feet: Secondary | ICD-10-CM

## 2022-06-09 NOTE — Therapy (Signed)
OUTPATIENT PHYSICAL THERAPY TREATMENT   Patient Name: Gregory [name] MRN: [id number] DOB:03-02-1941, 81 y.o., male Today's Date: 06/09/2022   PT End of Session - 06/09/22 1259     Visit Number 2    Number of Visits 19    Date for PT Re-Evaluation 08/19/22    Authorization Type MCR    PT Start Time 1300    PT Stop Time 1315    PT Time Calculation (min) 15 min    Activity Tolerance Patient tolerated treatment well    Behavior During Therapy WFL for tasks assessed/performed             Past Medical History:  Diagnosis Date   Anxiety state, unspecified    Arthritis    BACK PAIN, LUMBAR 02/08/2008   Qualifier: Diagnosis of  By: Julien Girt CMA, Leigh     Benign neoplasm of colon    Bilateral leg edema 03/19/2021   GERD (gastroesophageal reflux disease)    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  WITHOUT HEMOPTYSIS   History of nonmelanoma skin cancer EXCISION SQUAMOUS CELL FROM HAND   Hypertrophy of prostate with urinary obstruction and other lower urinary tract symptoms (LUTS)    Irritable bowel syndrome    Lumbago    Other diseases of nasal cavity and sinuses(478.19)    Persistent disorder of initiating or maintaining sleep    Plantar fascial fibromatosis    Pure hypercholesterolemia    Telangiectasia, hereditary hemorrhagic, of Rendu, 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  ([facility])   SKIN, LIPS, NASAL W/ PREVIOUS NOSE BLEEDS  AMD GI TELANGIECTASIA   Unspecified hypothyroidism    Past Surgical History:  Procedure Laterality Date   APPLICATION OF CRANIAL NAVIGATION N/A 05/16/2015   Procedure: APPLICATION OF CRANIAL NAVIGATION;  Surgeon: Consuella Lose, MD;  Location: [facility];  Service: Neurosurgery;  Laterality: N/A;   BRAIN BIOPSY Left 05/16/2015   Procedure: Stereotactic Left Brain Biopsy with Brain Lab;  Surgeon: Consuella Lose, MD;  Location: [facility];  Service: Neurosurgery;  Laterality: Left;  Stereotactic Left Brain biopsy with brainlab    BRAIN SURGERY     CARDIOVASCULAR STRESS TEST  01-14-2003   NO ISCHEMIA / EF 61%/ NORMAL LE WALL MOTION   EXCISION LEFT WRIST GANGLIAN/ MYXOID CYST  05-30-2009   IR RADIOLOGIST EVAL & MGMT  02/01/2019   NASAL SINUS SURGERY     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 Renda Rolls   TRANSTHORACIC ECHOCARDIOGRAM  10-17-2008   DR CRENSHAW   NORMAL LVF/ EF 60%/  MILDLY DILATED RIGHT ATRIUM/ VENTRICULE   VARICOCELECTOMY  1991   Dr. 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 ([place]) 29/56/2130   Interstitial lung disease ([place]) 04/26/2022   S/P TURP 04/06/2022   Chronic diastolic heart failure ([place]) 02/16/2022   Hemangioma 02/15/2022   History of malignant neoplasm of skin 02/15/2022   Lentigo 02/15/2022   Melanocytic nevi of trunk 02/15/2022   Other seborrheic keratosis 02/15/2022   Rosacea 02/15/2022   Urinary urgency 01/12/2022   Spondylolisthesis, lumbar region 12/15/2021   Trochanteric bursitis of left hip 12/15/2021   Symptomatic anemia 08/22/2021   Nonrheumatic mitral valve regurgitation 03/19/2021   Osteoarthritis of acromioclavicular joint 11/30/2020  SBE (subacute bacterial endocarditis) prophylaxis candidate 10/23/2020   Atherosclerosis of aorta (HCC) 10/23/2020   Prediabetes 10/23/2020   SIADH (syndrome of inappropriate ADH production) ([place]) 04/23/2020   Chronic hyponatremia 10/24/2019   Chronic cough 10/09/2019   Bilateral sensorineural hearing loss 02/22/2019   Pain in joint of left shoulder 02/01/2019   Laryngopharyngeal reflux (LPR) 01/29/2019   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   Pain in joint of right hip 07/11/2018   Seasonal allergic rhinitis due to  pollen 06/27/2018   Neuropathy due to drug [facility])    Encounter for therapeutic drug monitoring    Osteoarthritis 04/01/2015   Iron deficiency anemia due to chronic blood loss 10/08/2014   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 03/47/4259   Nocturnal leg cramps 06/11/2013   Dizziness 09/17/2008   HYPERCHOLESTEROLEMIA, MILD 07/25/2008   CARCINOMA, SKIN, SQUAMOUS CELL 02/10/2008   INSOMNIA, CHRONIC 02/10/2008   GERD 02/10/2008   Benign prostatic hyperplasia with urinary obstruction 02/10/2008   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 Pt [place] beach [place] Pt was packing during vacation when it started happening. "Lots of activity." Pt states he was going up and down stairs and getting the car ready when it happened. Pt was D/C from ED and advised to follow up with PCP. He has visit scheduled for 6/23. Pt states his HR has come down since being at home.   EVal Pt states that the only time he has balance issues is when he turning quickly or needed to back up. Pt states that uneven surfaces also cause issues with his mobility. He states he has to look at his feet in order to maintain his balance. Pt did previous PT episode for balance and that seemed to helped. Pt states he walks for exercise 20-30 mins 2x/week. Pt states he is here to prevent falls as he notices his balance is getting worse. Pt states he  did have a fall about a week ago where he caught his toe on the curb and fell into the door. Denies any serious injuries.   Pt denies signs of vertigo or dizziness. Pt denies focal changes in 5Ds 3Ns. 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 No  PRECAUTIONS: None  WEIGHT BEARING RESTRICTIONS No  FALLS:  Has patient fallen in last 6 months? Yes. Number of falls 1  LIVING ENVIRONMENT: Lives with: lives with their spouse Lives in: House/apartment Stairs: Yes: Internal: 13 steps Has following equipment at home: None  OCCUPATION: retired Programmer, systems  Enjoys: birding, yard work, music  PLOF: Independent with basic ADLs  PATIENT GOALS : Prevent future falls.  OBJECTIVE:   DIAGNOSTIC FINDINGS: N/A   BP  182/81 HR 64   TODAY'S TREATMENT:   6/21 None rendered today   Eval: Exercises - Heel Raises with Counter Support  - 2 x daily - 7 x weekly - 2 sets - 10 reps - Side Stepping with Counter Support  - 2 x daily - 7 x weekly - 1 sets - 4 reps - 29f hold - Tandem Walking with Counter Support  - 1 x daily - 7 x weekly - 3 sets - 10 reps   PATIENT EDUCATION:  Education details: precautions with BP reading, follow up with primary care  Person educated: Patient Education method: Explanation, Demonstration, Tactile cues, Verbal cues, and Handouts Education comprehension: verbalized understanding, returned demonstration, verbal cues required, and tactile cues required   HOME EXERCISE PROGRAM: Access Code: [email or web address] URL: [email or web address] Date: 05/21/2022 Prepared by: ADaleen Bo ASSESSMENT:  CLINICAL  IMPRESSION:  Pt presents to session following ED visit while on vacation. Pt reports have normalized BP and HR at home but BP during session was 182/81 with 64 HR. Pt advised to follow up with PCP as the primary concern is that his palpitations and A-fib were activity induced. Plan to continue with dynamic gait and balance as planned once pt is cleared by MD. Pt did not appear to be distress during session and denies nausea, dizziness, SOB, or other signs of distress.  Eval Patient is a 81y.o. male who was seen today for physical therapy evaluation and treatment for c/c of trouble walking and peripheral neuropathy. Pt has good static balance is very close to BEdison Internationalcut-off; likely due to ceiling affect. May consider FGA or Mini-Best for future assessments as pt's largest deficits are with more dynamic movements, change of direction, and dynamic gait. Pt would benefit from continued skilled therapy in order to reach goals and maximize functional LE strength and balance for prevention of further functional decline.    OBJECTIVE IMPAIRMENTS Abnormal gait, cardiopulmonary status limiting activity, decreased activity tolerance, decreased balance, decreased coordination, decreased endurance, decreased knowledge of use of DME, decreased mobility, difficulty walking, decreased ROM, decreased strength, hypomobility, impaired sensation, improper body mechanics, and postural dysfunction.   ACTIVITY LIMITATIONS carrying, lifting, standing, squatting, stairs, transfers, and locomotion level  PARTICIPATION LIMITATIONS: meal prep, cleaning, laundry, driving, shopping, community activity, and yard work  PERSONAL FACTORS Age, Behavior pattern, Fitness, Time since onset of injury/illness/exacerbation, and 3+ comorbidities:    are also affecting patient's functional outcome.   REHAB POTENTIAL: Fair    CLINICAL DECISION MAKING: Evolving/moderate complexity  EVALUATION COMPLEXITY: Moderate   GOALS: SHORT  TERM GOALS: Target date: 07/02/2022   Pt will become independent with HEP in order to demonstrate synthesis of PT education.   Goal status: INITIAL  2.  Pt will be able to demonstrate ability to retro walk 112fwithout LOB in order to demonstrate functional improvement in balance for community and home mobility.   Goal status: INITIAL     LONG TERM GOALS: Target date: 08/13/2022   Pt  will become independent with final HEP in order to demonstrate synthesis of PT education.   Goal status: INITIAL  2.  Pt will be able to demonstrate TUG in under 10 sec in order to demonstrate functional improvement in LE function, strength, balance, and mobility for safety with community ambulation.   Goal status: INITIAL  3.  Pt will be able to perform 5XSTS in under 12s  in order to demonstrate functional improvement above  the cut off score for adults.   Goal status: INITIAL  4.  Pt will have an at least 45/56 on Berg Balance scale in order to demonstrate improvement above cut off score for risk of falls.     Goal status: INITIAL  5. Pt will be able to demonstrate dynamic  change of direction during gait without LOB in order to demonstrate functional improvement in balance for prevention of falls during community ambulation.    Goal status: INITIAL   PLAN: PT FREQUENCY: 1-2x/week  PT DURATION: 12 weeks (likely DC in 10 wks)  PLANNED INTERVENTIONS: Therapeutic exercises, Therapeutic activity, Neuromuscular re-education, Balance training, Gait training, Patient/Family education, Joint manipulation, Joint mobilization, Stair training, Vestibular training, Orthotic/Fit training, DME instructions, Aquatic Therapy, Cryotherapy, Moist heat, Taping, Vasopneumatic device, Ultrasound, Manual therapy, and Re-evaluation  PLAN FOR NEXT SESSION: dynamic balance, progress to aquatic   Daleen Bo, PT 06/09/2022, 1:19 PM

## 2022-06-11 ENCOUNTER — Other Ambulatory Visit (HOSPITAL_BASED_OUTPATIENT_CLINIC_OR_DEPARTMENT_OTHER): Payer: Self-pay

## 2022-06-11 ENCOUNTER — Ambulatory Visit (INDEPENDENT_AMBULATORY_CARE_PROVIDER_SITE_OTHER): Payer: Medicare Other | Admitting: Adult Health

## 2022-06-11 ENCOUNTER — Encounter: Payer: Self-pay | Admitting: Adult Health

## 2022-06-11 VITALS — BP 132/68 | HR 72 | Ht 70.0 in | Wt 184.6 lb

## 2022-06-11 DIAGNOSIS — Q2572 Congenital pulmonary arteriovenous malformation: Secondary | ICD-10-CM

## 2022-06-11 DIAGNOSIS — I10 Essential (primary) hypertension: Secondary | ICD-10-CM | POA: Diagnosis not present

## 2022-06-11 DIAGNOSIS — I4892 Unspecified atrial flutter: Secondary | ICD-10-CM | POA: Diagnosis not present

## 2022-06-11 DIAGNOSIS — E7800 Pure hypercholesterolemia, unspecified: Secondary | ICD-10-CM

## 2022-06-11 DIAGNOSIS — E039 Hypothyroidism, unspecified: Secondary | ICD-10-CM

## 2022-06-11 DIAGNOSIS — N401 Enlarged prostate with lower urinary tract symptoms: Secondary | ICD-10-CM

## 2022-06-11 MED ORDER — SILODOSIN 8 MG PO CAPS: 8.0000 mg | ORAL_CAPSULE | Freq: Every day | ORAL | 1 refills | Status: DC

## 2022-06-14 ENCOUNTER — Ambulatory Visit (HOSPITAL_BASED_OUTPATIENT_CLINIC_OR_DEPARTMENT_OTHER): Payer: Medicare Other | Admitting: Physical Therapy

## 2022-06-14 ENCOUNTER — Other Ambulatory Visit (HOSPITAL_BASED_OUTPATIENT_CLINIC_OR_DEPARTMENT_OTHER): Payer: Self-pay | Admitting: Nurse Practitioner

## 2022-06-14 DIAGNOSIS — J4530 Mild persistent asthma, uncomplicated: Secondary | ICD-10-CM

## 2022-06-14 DIAGNOSIS — K219 Gastro-esophageal reflux disease without esophagitis: Secondary | ICD-10-CM

## 2022-06-16 ENCOUNTER — Encounter (HOSPITAL_BASED_OUTPATIENT_CLINIC_OR_DEPARTMENT_OTHER): Payer: Medicare Other | Admitting: Physical Therapy

## 2022-06-21 ENCOUNTER — Other Ambulatory Visit (HOSPITAL_BASED_OUTPATIENT_CLINIC_OR_DEPARTMENT_OTHER): Payer: Self-pay

## 2022-06-21 ENCOUNTER — Ambulatory Visit (HOSPITAL_BASED_OUTPATIENT_CLINIC_OR_DEPARTMENT_OTHER): Payer: Medicare Other | Admitting: Physical Therapy

## 2022-06-21 DIAGNOSIS — R030 Elevated blood-pressure reading, without diagnosis of hypertension: Secondary | ICD-10-CM

## 2022-06-21 MED ORDER — VALSARTAN 40 MG PO TABS: 40.0000 mg | ORAL_TABLET | Freq: Every day | ORAL | 3 refills | Status: DC

## 2022-06-23 ENCOUNTER — Ambulatory Visit (HOSPITAL_BASED_OUTPATIENT_CLINIC_OR_DEPARTMENT_OTHER): Payer: Medicare Other | Admitting: Family

## 2022-06-24 ENCOUNTER — Other Ambulatory Visit (HOSPITAL_BASED_OUTPATIENT_CLINIC_OR_DEPARTMENT_OTHER): Payer: Self-pay

## 2022-06-24 ENCOUNTER — Inpatient Hospital Stay: Payer: Medicare Other | Attending: Nurse Practitioner

## 2022-06-24 ENCOUNTER — Encounter (HOSPITAL_BASED_OUTPATIENT_CLINIC_OR_DEPARTMENT_OTHER): Payer: Medicare Other | Admitting: Physical Therapy

## 2022-06-24 DIAGNOSIS — D509 Iron deficiency anemia, unspecified: Secondary | ICD-10-CM | POA: Diagnosis present

## 2022-06-24 DIAGNOSIS — D500 Iron deficiency anemia secondary to blood loss (chronic): Secondary | ICD-10-CM

## 2022-06-24 LAB — CBC WITH DIFFERENTIAL (CANCER CENTER ONLY)
Abs Immature Granulocytes: 0.02 10*3/uL (ref 0.00–0.07)
Basophils Absolute: 0.1 10*3/uL (ref 0.0–0.1)
Basophils Relative: 1 %
Eosinophils Absolute: 0.1 10*3/uL (ref 0.0–0.5)
Eosinophils Relative: 2 %
HCT: 45.1 % (ref 39.0–52.0)
Hemoglobin: 15.4 g/dL (ref 13.0–17.0)
Immature Granulocytes: 0 %
Lymphocytes Relative: 42 %
Lymphs Abs: 3.4 10*3/uL (ref 0.7–4.0)
MCH: 30.7 pg (ref 26.0–34.0)
MCHC: 34.1 g/dL (ref 30.0–36.0)
MCV: 89.8 fL (ref 80.0–100.0)
Monocytes Absolute: 0.9 10*3/uL (ref 0.1–1.0)
Monocytes Relative: 12 %
Neutro Abs: 3.5 10*3/uL (ref 1.7–7.7)
Neutrophils Relative %: 43 %
Platelet Count: 274 10*3/uL (ref 150–400)
RBC: 5.02 MIL/uL (ref 4.22–5.81)
RDW: 13.2 % (ref 11.5–15.5)
WBC Count: 8 10*3/uL (ref 4.0–10.5)
nRBC: 0 % (ref 0.0–0.2)

## 2022-06-24 LAB — FERRITIN: Ferritin: 31 ng/mL (ref 24–336)

## 2022-06-24 MED ORDER — ZOSTER VAC RECOMB ADJUVANTED 50 MCG/0.5ML IM SUSR
INTRAMUSCULAR | 1 refills | Status: DC
  Filled 2022-06-24: qty 0.5, 1d supply, fill #0

## 2022-06-25 ENCOUNTER — Telehealth: Payer: Self-pay

## 2022-06-25 ENCOUNTER — Ambulatory Visit (HOSPITAL_BASED_OUTPATIENT_CLINIC_OR_DEPARTMENT_OTHER): Payer: Medicare Other | Admitting: Physical Therapy

## 2022-06-25 NOTE — Telephone Encounter (Signed)
-----   Message from Ladell Pier, MD sent at 06/24/2022  5:30 PM EDT ----- Please call patient, iron level and hemoglobin are normal, call for symptoms of anemia, follow-up as scheduled

## 2022-06-25 NOTE — Telephone Encounter (Signed)
VM left with message below. Pt instructed to call [place] with questions/concerns

## 2022-06-28 ENCOUNTER — Ambulatory Visit (HOSPITAL_BASED_OUTPATIENT_CLINIC_OR_DEPARTMENT_OTHER): Payer: Medicare Other | Admitting: Physical Therapy

## 2022-06-30 ENCOUNTER — Other Ambulatory Visit: Payer: Self-pay | Admitting: Cardiology

## 2022-07-01 ENCOUNTER — Ambulatory Visit (HOSPITAL_BASED_OUTPATIENT_CLINIC_OR_DEPARTMENT_OTHER): Payer: Medicare Other | Admitting: Physical Therapy

## 2022-07-13 ENCOUNTER — Other Ambulatory Visit: Payer: Self-pay | Admitting: Cardiology

## 2022-07-13 ENCOUNTER — Telehealth (HOSPITAL_BASED_OUTPATIENT_CLINIC_OR_DEPARTMENT_OTHER): Payer: Self-pay | Admitting: Nurse Practitioner

## 2022-07-13 DIAGNOSIS — R059 Cough, unspecified: Secondary | ICD-10-CM

## 2022-07-13 NOTE — Telephone Encounter (Signed)
Pt called needing refills on medication(s)  gabapentin (NEURONTIN) 100 MG capsule [[id number]]   Sent to this pharmacy:  [id number] [place] [id number]   Pt has this many left:  Pt did not state.    Pt is aware that Sb is not in the office and will be sending to our on call provider Dr. de CGuamPlease advise.

## 2022-07-14 ENCOUNTER — Other Ambulatory Visit (HOSPITAL_BASED_OUTPATIENT_CLINIC_OR_DEPARTMENT_OTHER): Payer: Self-pay | Admitting: Nurse Practitioner

## 2022-07-14 DIAGNOSIS — R059 Cough, unspecified: Secondary | ICD-10-CM

## 2022-07-14 MED ORDER — GABAPENTIN 100 MG PO CAPS: 100.0000 mg | ORAL_CAPSULE | Freq: Two times a day (BID) | ORAL | 0 refills | Status: DC

## 2022-07-29 ENCOUNTER — Ambulatory Visit: Payer: Self-pay | Admitting: Licensed Clinical Social Worker

## 2022-07-29 NOTE — Patient Outreach (Signed)
  Care Coordination   07/29/2022 Name: Gregory [name] MRN: [id number] DOB: July 06, 1941   Care Coordination Outreach Attempts:  An unsuccessful telephone outreach was attempted today to offer the patient information about available care coordination services as a benefit of their health plan.   Follow Up Plan:  Additional outreach attempts will be made to offer the patient care coordination information and services.   Encounter Outcome:  No Answer  Care Coordination Interventions Activated:  No   Care Coordination Interventions:  No, not indicated    Gregory [name], [place] [phone]

## 2022-08-02 ENCOUNTER — Ambulatory Visit: Payer: Self-pay | Admitting: Licensed Clinical Social Worker

## 2022-08-02 NOTE — Patient Outreach (Signed)
  Care Coordination  Initial Visit Note   08/02/2022 Name: MAYNOR MWANGI MRN: [id number] DOB: 01-16-1941  ELRIC TIRADO is a 81 y.o. year old male who sees Early, [name], NP for primary care. I spoke with  [name] by phone today  What matters to the patients health and wellness today?  Declined    Goals Addressed             This Visit's Progress    COMPLETED: Care Coordination Activities/ No Follow up Required       Care Coordination Interventions: Discussed benefits of Medicare Annual Wellness Visit : declined            SDOH assessments and interventions completed:  No   Care Coordination Interventions Activated:  No  Care Coordination Interventions:  No, not indicated   Follow up plan: No further intervention required.   Encounter Outcome:  Pt. Nye, [place] [phone]

## 2022-08-02 NOTE — Patient Instructions (Signed)
Visit Information  Thank you for taking time to visit with me today. Please don't hesitate to contact me if I can be of assistance to you.   No goals were discussed today:   Goals Addressed             This Visit's Progress    COMPLETED: Care Coordination Activities/ No Follow up Required       Care Coordination Interventions: Discussed benefits of Medicare Annual Wellness Visit : declined            Please call the care guide team at [phone] if you would like to schedule a phone appointment with me.   If you are experiencing a Mental Health or [place] or need someone to talk to, please call [phone]-TALK (toll free, 24 hour hotline)   The patient verbalized understanding of instructions, educational materials, and care plan provided today and DECLINED offer to receive copy of patient instructions, educational materials, and care plan.   No further follow up required: by care coordination   Casimer Lanius, [place] [phone]

## 2022-08-03 NOTE — Progress Notes (Unsigned)
Follow-up Visit   Date: 08/04/22   Gregory [name] MRN: [id number] DOB: 08-16-1941   Interim History: Gregory [name] is a 81 y.o. right-handed Caucasian male with hypothyroidism, hyperlipidemia, Osler-Weber- Rendu disease, GERD, and left external basal ganglia polymicrobial abscess (2016) returning to the clinic for follow-up of right sided spasticity, neuropathy, and new complaint of confusion.   The patient was accompanied to the clinic by self.  IMPRESSION/PLAN: 1.  Post-op spell of confusion, aphasia in March 2023 following TURP.  - CT head and EEG is unremarkable  - Check US carotids to evaluate for stenosis to stratify vascular risk for TIA  - He is unable to takes aspirin due to bleeding risk associated with Osler Weber Rendu disease  2.  Gait unsteadiness, worse with turning, stable  - Fall precautions discussed  3.  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  Patient remains highly independent  - Neuropsychological testing deferred   4.  Drug-induced neuropathy, stable and mild.   - He takes gabapentin '100mg'$  BID for chronic cough  5. 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     Return to clinic in 1 year  ------------------------------------------------------- History of present illness: He was hospitalized from May 24 - May 21 2015 with right sided leg weakness and headaches. Patient was found to have a biopsy-proven brain abscess 3 x 2 cm involving the basal ganglia with midline shift and edema seen on CT head.  IV antibiotics including vancomycin, rocephin, and flagyl was started.  He was also started on keppra '500mg'$  BID.  Of note, he had routine dental work 2 weeks prior to symptom onset.  He has completed his course of antibiotics therapy with IV for 6 weeks and transitioned to oral levofloxacin and metronidazole for 4 weeks.  Starting ~ late July  2016, he started noticing numbness/tingling of the toes and shooting pain involving the sole of the foot.  Symptoms are constant and involve both feet equally.  Shooting pain is worse with weight bearing.  Nothing alleviates the pain.  He endorses mild low back pain.  These symptoms were thought to be due to flagyl-induced neuropathy and slowly improved.   UPDATE 04/06/2021: He continues to have issues with imbalance, especially with turning.  Fortunately, he has not had any falls. He continues to walk unassisted.  He also has problems with remembering names.  No other memory issues.  He manages finances, medication, drives, and household chores.  Sometimes, he feels that his speech may mumble, especially if he talks fast.  No slurred speech. Last summer, he has a syncopal event at church, thought to be medication-induced.  He has not had any other events like this.  Numbness in the feet is stable and unchanged.  UPDATE 08/04/2022: He is here for follow-up visit.  He reports having a spell of confusion following TURP on 03/02/2022 which lasted 2-3 hours.  During this time, he was confused and unable to think of what he wanted to say and get the words out.  No focal weakness.  CT head did not show acute stroke.  EEG was normal.  BP has been elevated and being monitored by PCP.  He has not had this recur.  He continues to be forgetful with tasks and tends to make a lot of notes, but able to manage all IADLs.  His neuropathy is stable with numbness restricted to the feet.  Balance continues to  be a problem.  He has one fall earlier this year from loosing his balance over a threshold. He walks unassisted.  He was going to start PT but this has been delayed.    Medications:  Current Outpatient Medications on File Prior to Visit  Medication Sig Dispense Refill   acetaminophen (TYLENOL) 650 MG CR tablet Take 1,300 mg by mouth 2 (two) times daily.     amoxicillin (AMOXIL) 500 MG capsule SMARTSIG:4 Capsule(s) By  Mouth PRN (Patient taking differently: SMARTSIG:4 Capsule(s) By Mouth PRN for dental visits) 4 capsule 3   Ascorbic Acid (VITAMIN C) 1000 MG tablet 1 tablet     Cholecalciferol 125 MCG (5000 UT) capsule Take 5,000 Units by mouth daily.     clidinium-chlordiazePOXIDE (LIBRAX) 5-2.5 MG capsule Take 1 capsule by mouth 3 (three) times daily before meals. (Patient taking differently: Take 1 capsule by mouth 3 (three) times daily before meals. Taking as needed) 90 capsule 3   fluorometholone (FML) 0.1 % ophthalmic suspension SMARTSIG:1 Drop(s) In Eye(s) 3 Times Daily PRN     fluorouracil (EFUDEX) 5 % cream daily     gabapentin (NEURONTIN) 100 MG capsule Take 1 capsule (100 mg total) by mouth 2 (two) times daily. 180 capsule 0   Iron, Ferrous Sulfate, 325 (65 Fe) MG TABS Take 325 mg by mouth in the morning, at noon, and at bedtime. 30 tablet 2   levocetirizine (XYZAL) 5 MG tablet Take 1 tablet (5 mg total) by mouth every evening. 90 tablet 1   levothyroxine (SYNTHROID) 150 MCG tablet Take 1 tablet (150 mcg total) by mouth daily before breakfast. 90 tablet 3   Magnesium 250 MG TABS Take 250 mg by mouth at bedtime.      meclizine (ANTIVERT) 25 MG tablet Take 25 mg by mouth 2 (two) times daily as needed for dizziness.     methocarbamol (ROBAXIN) 500 MG tablet Take 1 tablet (500 mg total) by mouth every 8 (eight) hours as needed for muscle spasms. 270 tablet 0   metoprolol tartrate (LOPRESSOR) 25 MG tablet TAKE 1/2 TABLET (12.5 MG) AS NEEDED FOR AFIB. 45 tablet 2   montelukast (SINGULAIR) 10 MG tablet TAKE 1 TABLET BY MOUTH AT  BEDTIME 90 tablet 3   Multiple Vitamin (MULTIVITAMIN WITH MINERALS) TABS tablet Take 1 tablet by mouth every morning.     pantoprazole (PROTONIX) 40 MG tablet TAKE 1 TABLET BY MOUTH TWICE  DAILY 180 tablet 3   silodosin (RAPAFLO) 8 MG CAPS capsule Take 1 capsule (8 mg total) by mouth at bedtime. TAKE ONE CAPSULE BY MOUTH DAILY AT BEDTIME 90 capsule 1   valsartan (DIOVAN) 40 MG tablet  TAKE 1 TABLET BY MOUTH EVERY DAY 90 tablet 1   zolpidem (AMBIEN) 10 MG tablet Take by mouth. Taking as needed     Zoster Vaccine Adjuvanted [facility]) injection Inject into the muscle. 0.5 mL 1   Misc Natural Products (SAW PALMETTO) CAPS Take 1 capsule by mouth at bedtime. Taking twice daily     montelukast (SINGULAIR) 10 MG tablet Take by mouth.     pantoprazole (PROTONIX) 40 MG tablet Take by mouth.     No current facility-administered medications on file prior to visit.    Allergies:  Allergies  Allergen Reactions   Nsaids Other (See Comments)    Cannot take-per MD   Other Other (See Comments)    All blood thinners-cannot take per MD   Aspirin Other (See Comments)    nosebleeds   Statins  Other (See Comments)    Weakness, myalgias   Demeclocycline Other (See Comments)    Passed out   Tizanidine Hcl Other (See Comments)    Patient stated that after taking this medication he experienced feelings of dizziness and disorientation.      Vital Signs:  BP (!) 151/69   Pulse 72   Ht '5\' 10"'$  (1.778 m)   Wt 184 lb (83.5 kg)   SpO2 92%   BMI 26.40 kg/m   Neurological Exam: MENTAL STATUS including orientation to time, place, person, recent and remote memory, attention span and concentration, language, and fund of knowledge is normal.  Speech is not dysarthric.  CRANIAL NERVES:  Normal fundoscopic exam.  Pupils equal round and reactive to light.  Normal conjugate, extra-ocular eye movements in all directions of gaze.  Right ptosis (old).  Face is symmetric and muscles are intact. Palate elevates symmetrically.  Tongue is midline.  MOTOR:  Motor strength is 5/5 in all extremities.   No pronator drift.  There is mild spasticity in the RUE and RLE (0+)  MSRs:  Reflexes are 3+/4 throughout, brisker on the right side.  SENSORY:  Vibration is reduced at the ankles, intact at the knees.    COORDINATION/GAIT:  Normal finger-to- nose-finger. Intact rapid alternating movements bilaterally.   Gait mildly wide-based, unsteady slightly with turns, unassisted.  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  Stable 4 mm LEFT-to-RIGHT midline shift.   CT head 06/24/2015:  Significant interval improvement when compared to the 05/13/2015 examination.  The ring-enhancing lesion which was centered in the left basal ganglia has decreased significantly in size. Currently, 2.3 x 0.9 cm region of relatively solid enhancement superior aspect of the left basal ganglia remains as versus prior 3.1 x 1.9 cm ring-enhancing lesion. Significant decrease in surrounding vasogenic edema and marked decrease mass effect upon the left lateral ventricle. What remains may represent residua of treated abscess. This will need to be followup until complete clearance. As this clears it would be helpful to exclude the possibility of underlying vascular abnormality in this patient with Osler-Weber-Rendu.  CT head 08/11/2015: Resolving abscess left external capsule. There is decreased edema and decreased enhancement in this area. No residual fluid collection. No other areas of acute abnormality.  CT head wwo contrast 01/06/2016:  Hypoattenuation in the LEFT basal ganglia and regional white matter is improved from August but not yet normalized. No abnormal postcontrast enhancement to definitively suggest residual infection. Continued surveillance may be warranted, given the patient's residual RIGHT leg weakness and intermittent slurred speech.   NCS/EMG of the legs 09/16/2015: The electrophysiologic findings are most consistent with a generalized sensorimotor polyneuropathy, axon loss in type, affecting the lower extremities. Overall, these findings are severe in degree electrically  with respect to sensory responses. 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  Labs 09/11/2015:  vitamin B12 874, folate 24.8, vitamin B1 36, copper 77, vitamin B6 73.8, myasthenia panel negative  CT head 01/21/2017:  No change from the prior CT. Negative for abscess or mass lesion. Chronic encephalomalacia left external capsule related to prior cerebral abscess.  Routine 03/15/2022:  Normal   CT head 03/03/2022: 1.  No acute intracranial hemorrhage.  2.  No evidence of acute large vascular territory infarct,  large vessel occlusion, or cerebral perfusion disturbance. Because CT is relatively insensitive for the detection of ischemia within the first 24 hours, if high clinical suspicion remains for acute infarction, MRI would be recommended for further evaluation.  3.  No 50% or greater arterial stenosis within the head or neck.  4.  Slight asymmetric hypoattenuation in the left frontal white matter extending towards prior left frontal burr hole, favored to reflect chronic changes related to prior abscess. If high clinical suspicion remains for acute infarction, MRI would be recommended for further evaluation.  5.  Background of chronic small vessel disease and generalized cerebral and cerebellar atrophy.      Thank you for allowing me to participate in patient's care.  If I can answer any additional questions, I would be pleased to do so.    Sincerely,    Kourtland Coopman K. Posey Pronto, DO

## 2022-08-04 ENCOUNTER — Encounter: Payer: Self-pay | Admitting: Neurology

## 2022-08-04 ENCOUNTER — Other Ambulatory Visit (INDEPENDENT_AMBULATORY_CARE_PROVIDER_SITE_OTHER): Payer: Medicare Other

## 2022-08-04 ENCOUNTER — Ambulatory Visit (INDEPENDENT_AMBULATORY_CARE_PROVIDER_SITE_OTHER): Payer: Medicare Other | Admitting: Neurology

## 2022-08-04 VITALS — BP 151/69 | HR 72 | Ht 70.0 in | Wt 184.0 lb

## 2022-08-04 DIAGNOSIS — G620 Drug-induced polyneuropathy: Secondary | ICD-10-CM

## 2022-08-04 DIAGNOSIS — R42 Dizziness and giddiness: Secondary | ICD-10-CM | POA: Diagnosis not present

## 2022-08-04 DIAGNOSIS — R4789 Other speech disturbances: Secondary | ICD-10-CM | POA: Diagnosis not present

## 2022-08-04 DIAGNOSIS — R2681 Unsteadiness on feet: Secondary | ICD-10-CM

## 2022-08-04 LAB — VITAMIN B12: Vitamin B-12: 706 pg/mL (ref 211–911)

## 2022-08-04 NOTE — Patient Instructions (Addendum)
Check ultrasound carotids  Check vitamin B12 level   Start to use a cane or hiking stick on uneven ground  Return to clinic in 1 year

## 2022-08-09 ENCOUNTER — Ambulatory Visit
Admission: RE | Admit: 2022-08-09 | Discharge: 2022-08-09 | Disposition: A | Discharge status: Home / Self Care | Payer: Medicare Other | Source: Ambulatory Visit | Attending: Neurology | Admitting: Neurology

## 2022-08-09 DIAGNOSIS — R4789 Other speech disturbances: Secondary | ICD-10-CM

## 2022-08-09 DIAGNOSIS — R42 Dizziness and giddiness: Secondary | ICD-10-CM

## 2022-08-09 DIAGNOSIS — G620 Drug-induced polyneuropathy: Secondary | ICD-10-CM

## 2022-08-09 DIAGNOSIS — R2681 Unsteadiness on feet: Secondary | ICD-10-CM

## 2022-08-10 ENCOUNTER — Telehealth (HOSPITAL_BASED_OUTPATIENT_CLINIC_OR_DEPARTMENT_OTHER): Payer: Self-pay

## 2022-08-10 DIAGNOSIS — K580 Irritable bowel syndrome with diarrhea: Secondary | ICD-10-CM

## 2022-08-10 MED ORDER — CILIDINIUM-CHLORDIAZEPOXIDE 2.5-5 MG PO CAPS: 1.0000 | ORAL_CAPSULE | Freq: Three times a day (TID) | ORAL | 3 refills | Status: DC

## 2022-08-10 NOTE — Telephone Encounter (Signed)
Patient called I spoke with him. He needs a refill on Clorazepate # 30.

## 2022-08-17 ENCOUNTER — Other Ambulatory Visit: Payer: Self-pay | Admitting: Internal Medicine

## 2022-08-17 DIAGNOSIS — Z298 Encounter for other specified prophylactic measures: Secondary | ICD-10-CM

## 2022-08-17 NOTE — Telephone Encounter (Signed)
Gregory [name],   It does not look like he has ever been prescribed Tranxene by me. Please ask him when he last had this medication prescribed and the doses he has taken.   This is not a medication recommended for patients over 65 and cannot be prescribed with zolpidem [name]). It carries a very high risk of falls, dizziness, confusion, and severe drowsiness in older adults. If it is something he has been on consistently for years, we can cautiously consider [name] doses, but the [name] will have to be discontinued.   Please ask if he is not sleeping well with the ambien.   I

## 2022-08-20 ENCOUNTER — Other Ambulatory Visit: Payer: Self-pay | Admitting: Nurse Practitioner

## 2022-08-20 DIAGNOSIS — Z298 Encounter for other specified prophylactic measures: Secondary | ICD-10-CM

## 2022-08-29 ENCOUNTER — Other Ambulatory Visit (HOSPITAL_BASED_OUTPATIENT_CLINIC_OR_DEPARTMENT_OTHER): Payer: Self-pay | Admitting: Nurse Practitioner

## 2022-08-29 DIAGNOSIS — R030 Elevated blood-pressure reading, without diagnosis of hypertension: Secondary | ICD-10-CM

## 2022-09-06 ENCOUNTER — Encounter (HOSPITAL_BASED_OUTPATIENT_CLINIC_OR_DEPARTMENT_OTHER): Payer: Self-pay | Admitting: Nurse Practitioner

## 2022-09-06 ENCOUNTER — Ambulatory Visit (INDEPENDENT_AMBULATORY_CARE_PROVIDER_SITE_OTHER): Payer: Medicare Other | Admitting: Nurse Practitioner

## 2022-09-06 VITALS — BP 135/55 | HR 70 | Ht 70.0 in | Wt 183.5 lb

## 2022-09-06 DIAGNOSIS — G4709 Other insomnia: Secondary | ICD-10-CM

## 2022-09-06 DIAGNOSIS — R059 Cough, unspecified: Secondary | ICD-10-CM

## 2022-09-06 DIAGNOSIS — R053 Chronic cough: Secondary | ICD-10-CM

## 2022-09-06 DIAGNOSIS — R42 Dizziness and giddiness: Secondary | ICD-10-CM | POA: Diagnosis not present

## 2022-09-06 DIAGNOSIS — Z298 Encounter for other specified prophylactic measures: Secondary | ICD-10-CM | POA: Diagnosis not present

## 2022-09-06 DIAGNOSIS — Z23 Encounter for immunization: Secondary | ICD-10-CM | POA: Diagnosis not present

## 2022-09-06 MED ORDER — GABAPENTIN 100 MG PO CAPS: 100.0000 mg | ORAL_CAPSULE | Freq: Two times a day (BID) | ORAL | 3 refills | Status: DC

## 2022-09-06 MED ORDER — AMOXICILLIN 500 MG PO CAPS
ORAL_CAPSULE | ORAL | 6 refills | Status: DC
Start: 2022-09-06 — End: 2022-12-27

## 2022-09-06 MED ORDER — CLORAZEPATE DIPOTASSIUM 7.5 MG PO TABS: 7.5000 mg | ORAL_TABLET | Freq: Every evening | ORAL | 3 refills | Status: DC | PRN

## 2022-09-06 NOTE — Telephone Encounter (Signed)
Laretta Bolster took care while patient was in the office 09/06/2022.

## 2022-09-06 NOTE — Patient Instructions (Addendum)
It was a pleasure seeing you today. I hope your time spent with [place] was pleasant and helpful. Please let us know if there is anything we can do to improve the service you receive.   I feel like the episode you had is due to fluid in the inner ear. This can happen when allergies are elevated and the little crystals in your ear end up coming out of place. If this happens again, I want you to let me know.   I want you to keep drinking the gatorade to keep your fluids up.     Important Office Information Lab Results If labs were ordered, please note that you will see results through [place] as soon as they come available from [place].  It takes up to 5 business days for the results to be routed to me and for me to review them once all of the lab results have come through from [facility]. I will make recommendations based on your results and send these through [place] or someone from the office will call you to discuss. If your labs are abnormal, we may contact you to schedule a visit to discuss the results and make recommendations.  If you have not heard from [place] within 5 business days or you have waited longer than a week and your lab results have not come through on [place], please feel free to call the office or send a message through [place] to follow-up on these labs.   Referrals If referrals were placed today, the office where the referral was sent will contact you either by phone or through [place] to set up scheduling. Please note that it can take up to a week for the referral office to contact you. If you do not hear from them in a week, please contact the referral office directly to inquire about scheduling.   Condition Treated If your condition worsens or you begin to have new symptoms, please schedule a follow-up appointment for further evaluation. If you are not sure if an appointment is needed, you may call the office to leave a message for the nurse and someone will contact you with  recommendations.  If you have an urgent or life threatening emergency, please do not call the office, but seek emergency evaluation by calling 911 or going to the nearest emergency room for evaluation.   MyChart and Phone Calls Please do not use MyChart for urgent messages. It may take up to 3 business days for MyChart messages to be read by staff and if they are unable to handle the request, an additional 3 business days for them to be routed to me and for my response.  Messages sent to the provider through [place] do not come directly to the provider, please allow time for these messages to be routed and for me to respond.  We get a large volume of MyChart messages daily and these are responded to in the order received.   For urgent messages, please call the office at [phone] and speak with the front office staff or leave a message on the line of my assistant for guidance.  We are seeing patients from the hours of 8:00 am through 5:00 pm and calls directly to the nurse may not be answered immediately due to seeing patients, but your call will be returned as soon as possible.  Phone  messages received after 4:00 PM Monday through Thursday may not be returned until the following business day. Phone messages received after 11:00 AM  on Friday may not be returned until Monday.   After Hours We share on call hours with providers from other offices. If you have an urgent need after hours that cannot wait until the next business day, please contact the on call provider by calling the office number. A nurse will speak with you and contact the provider if needed for recommendations.  If you have an urgent or life threatening emergency after hours, please do not call the on call provider, but seek emergency evaluation by calling 911 or going to the nearest emergency room for evaluation.   Paperwork All paperwork requires a minimum of 5 days to complete and return to you or the designated personnel.  Please keep this in mind when bringing in forms or sending requests for paperwork completion to the office.

## 2022-09-06 NOTE — Progress Notes (Signed)
[name], DNP, AGNP-c Primary Care & Sports Medicine [address]  [place], [place] [address] [phone]  Subjective:   Gregory [name] is a 81 y.o. male presents to day for evaluation of: Follow-up (Patient present today for dizziness 1 week ago and no again. 1 episode. Dr Lenna Gilford gave him Tranxene 4 years he still taking. No longer taking Ambien. He needs refill on Gabapentin Patient would like flu shot while in office.)  He has had the first shingles shot   Dizziness.  Happened about a week ago He tells me the room was spinning around him and he almost fell over.  He tells me that this was a different dizziness than he is used to. He tells me typically when he has vertigo he just feels al ittle off balance He tells me the episode lasted about 23mand resolved spontanously No blacking out, no vision changes, no palpitations, no weakness, no slurred speech He didn't feel like this was cardiac related He does have allergies and he tells me that he was working outside the day before this happened No CP, ShOB, cold symptoms,  He tells me he felt a little dehydrated at the time. He does have meclizine at home to use for vertigo  [place]  [place] could not remember where he was or who he was He is not sure if this is related to the dizziness  PMH, Medications, and Allergies reviewed and updated in chart as appropriate.   ROS negative except for what is listed in HPI. Objective:  BP (!) 135/55   Pulse 70   Ht '5\' 10"'$  (1.778 m)   Wt 183 lb 8 oz (83.2 kg)   SpO2 94%   BMI 26.33 kg/m  Physical Exam Vitals and nursing note reviewed.  Constitutional:      General: He is not in acute distress.    Appearance: Normal appearance. He is not ill-appearing.  HENT:     Head: Normocephalic and atraumatic.     Mouth/Throat:     Mouth: Mucous membranes are moist.     Pharynx: Oropharynx  is clear.  Eyes:     General: Lids are normal. Vision grossly intact.     Extraocular Movements: Extraocular movements intact.     Right eye: No nystagmus.     Left eye: Nystagmus present.     Pupils: Pupils are equal, round, and reactive to light.  Neck:     Vascular: No carotid bruit.  Cardiovascular:     Rate and Rhythm: Normal rate and regular rhythm.     Pulses: Normal pulses.     Heart sounds: Normal heart sounds.  Pulmonary:     Effort: Pulmonary effort is normal.     Breath sounds: Normal breath sounds.  Abdominal:     General: Bowel sounds are normal.     Palpations: Abdomen is soft.  Musculoskeletal:        General: Normal range of motion.     Right lower leg: No edema.     Left lower leg: No edema.  Lymphadenopathy:     Cervical: No cervical adenopathy.  Skin:    General: Skin is warm and dry.     Capillary Refill: Capillary refill takes less than 2 seconds.  Neurological:     General: No focal deficit present.     Mental Status: He is alert and oriented to person, place, and  time.     Cranial Nerves: No cranial nerve deficit.     Sensory: No sensory deficit.     Motor: No weakness.     Coordination: Coordination normal.     Gait: Gait normal.     Deep Tendon Reflexes: Reflexes normal.  Psychiatric:        Mood and Affect: Mood normal.        Behavior: Behavior normal.        Thought Content: Thought content normal.        Judgment: Judgment normal.           Assessment & Plan:   Problem List Items Addressed This Visit     Other insomnia - Primary    Chronic.  Well-controlled with triamterene.  Refill provided today.      Relevant Medications   clorazepate (TRANXENE-T) 7.5 MG tablet   Dizziness    Symptoms and presentation are consistent with vertigo.  It is possible that working outside in the yard triggered increased allergic symptoms that then triggered his vertigo.  Examination of the patient today benign with no alarm symptoms present.  At  this time I recommend that the monitor closely.  If symptoms present again patient encouraged to try meclizine as long as there are no other alarm symptoms such as chest pain, shortness of breath, weakness, slurred speech present.  We will plan to follow-up or seek emergency care if symptoms persist.  Patient is in agreement to plan and expressed understanding.      Chronic cough    Neurogenic cough controlled with gabapentin.  Refill provided today.      SBE (subacute bacterial endocarditis) prophylaxis candidate    Amoxicillin provided for prophylactic use as needed.  Patient to keep on hand and take as directed prior to procedures.      Relevant Medications   amoxicillin (AMOXIL) 500 MG capsule   Other Visit Diagnoses     Cough       Relevant Medications   gabapentin (NEURONTIN) 100 MG capsule   Need for viral immunization             [name], DNP, AGNP-c 09/19/2022  12:33 AM    History, Medications, Surgery, SDOH, and Family History reviewed and updated as appropriate.

## 2022-09-19 NOTE — Assessment & Plan Note (Signed)
Symptoms and presentation are consistent with vertigo.  It is possible that working outside in the yard triggered increased allergic symptoms that then triggered his vertigo.  Examination of the patient today benign with no alarm symptoms present.  At this time I recommend that the monitor closely.  If symptoms present again patient encouraged to try meclizine as long as there are no other alarm symptoms such as chest pain, shortness of breath, weakness, slurred speech present.  We will plan to follow-up or seek emergency care if symptoms persist.  Patient is in agreement to plan and expressed understanding.

## 2022-09-19 NOTE — Assessment & Plan Note (Signed)
Chronic.  Well-controlled with triamterene.  Refill provided today.

## 2022-09-19 NOTE — Assessment & Plan Note (Signed)
Amoxicillin provided for prophylactic use as needed.  Patient to keep on hand and take as directed prior to procedures.

## 2022-09-19 NOTE — Assessment & Plan Note (Signed)
Neurogenic cough controlled with gabapentin.  Refill provided today.

## 2022-09-20 ENCOUNTER — Telehealth: Payer: Self-pay | Admitting: Oncology

## 2022-09-20 NOTE — Telephone Encounter (Signed)
Returned patient call to reschedule appointment left message to call the office back for a day and time

## 2022-09-23 ENCOUNTER — Inpatient Hospital Stay: Payer: Medicare Other | Attending: Nurse Practitioner | Admitting: Oncology

## 2022-09-23 ENCOUNTER — Inpatient Hospital Stay: Payer: Medicare Other

## 2022-09-23 ENCOUNTER — Inpatient Hospital Stay: Payer: Medicare Other | Admitting: Oncology

## 2022-09-23 VITALS — BP 148/64 | HR 98 | Temp 98.2°F | Resp 18 | Ht 70.0 in | Wt 185.0 lb

## 2022-09-23 DIAGNOSIS — D509 Iron deficiency anemia, unspecified: Secondary | ICD-10-CM | POA: Diagnosis present

## 2022-09-23 DIAGNOSIS — I780 Hereditary hemorrhagic telangiectasia: Secondary | ICD-10-CM | POA: Insufficient documentation

## 2022-09-23 DIAGNOSIS — D500 Iron deficiency anemia secondary to blood loss (chronic): Secondary | ICD-10-CM | POA: Diagnosis not present

## 2022-09-23 LAB — FERRITIN: Ferritin: <1 ng/mL — ABNORMAL LOW (ref 24–336)

## 2022-09-23 LAB — CBC WITH DIFFERENTIAL (CANCER CENTER ONLY)
Abs Immature Granulocytes: 0.03 10*3/uL (ref 0.00–0.07)
Basophils Absolute: 0 10*3/uL (ref 0.0–0.1)
Basophils Relative: 1 %
Eosinophils Absolute: 0.1 10*3/uL (ref 0.0–0.5)
Eosinophils Relative: 1 %
HCT: 42.5 % (ref 39.0–52.0)
Hemoglobin: 14.9 g/dL (ref 13.0–17.0)
Immature Granulocytes: 0 %
Lymphocytes Relative: 44 %
Lymphs Abs: 3.6 10*3/uL (ref 0.7–4.0)
MCH: 31.5 pg (ref 26.0–34.0)
MCHC: 35.1 g/dL (ref 30.0–36.0)
MCV: 89.9 fL (ref 80.0–100.0)
Monocytes Absolute: 0.7 10*3/uL (ref 0.1–1.0)
Monocytes Relative: 9 %
Neutro Abs: 3.7 10*3/uL (ref 1.7–7.7)
Neutrophils Relative %: 45 %
Platelet Count: 259 10*3/uL (ref 150–400)
RBC: 4.73 MIL/uL (ref 4.22–5.81)
RDW: 13.5 % (ref 11.5–15.5)
WBC Count: 8.2 10*3/uL (ref 4.0–10.5)
nRBC: 0 % (ref 0.0–0.2)

## 2022-09-23 NOTE — Progress Notes (Signed)
  [place] OFFICE PROGRESS NOTE   Diagnosis: HHT, history of iron deficiency anemia  INTERVAL HISTORY:   Gregory [name] returns as scheduled.  Continues iron 3 times daily.  Remittent nosebleeding.  No other bleeding.  He had an episode of atrial fibrillation in June.  Objective:  Vital signs in last 24 hours:  Blood pressure (!) 148/64, pulse 98, temperature 98.2 F (36.8 C), resp. rate 18, height '5\' 10"'$  (1.778 m), weight 185 lb (83.9 kg), SpO2 98 %.    Resp: Lungs clear bilaterally Cardio: Regular rate and rhythm GI: No hepatosplenomegaly Vascular: No leg edema  Skin: Multiple telangiectasias over the head and face  Lab Results:  Lab Results  Component Value Date   WBC 8.2 09/23/2022   HGB 14.9 09/23/2022   HCT 42.5 09/23/2022   MCV 89.9 09/23/2022   PLT 259 09/23/2022   NEUTROABS 3.7 09/23/2022    CMP  Lab Results  Component Value Date   NA 128 (L) 04/26/2022   K 4.8 04/26/2022   CL 91 (L) 04/26/2022   CO2 26 04/26/2022   GLUCOSE 100 (H) 04/26/2022   BUN 10 04/26/2022   CREATININE 0.95 04/26/2022   CALCIUM 9.5 04/26/2022   PROT 6.7 04/26/2022   ALBUMIN 4.6 04/26/2022   AST 19 04/26/2022   ALT 15 04/26/2022   ALKPHOS 74 04/26/2022   BILITOT 0.3 04/26/2022   GFRNONAA >60 08/23/2021   GFRAA >60 06/01/2020    Medications: I have reviewed the patient's current medications.   Assessment/Plan: Iron deficiency anemia  08/18/2021 hemoglobin 6.8, MCV 58 08/20/2021 hemoglobin 6.6 MCV 59, ferritin 3 08/22/2021 hemoglobin 6.5, stool negative for occult blood, transfused 2 units of blood, Venofer 200 mg IV 08/23/2021 hemoglobin 8.3 HHT Pulmonary AVM right lower lobe, followed by Dr. 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  The  hemoglobin remains in the normal range.  We will follow-up on the ferritin level from today.  He will decrease the ferrous sulfate to twice daily.  He will return for lab visit in 3 months and an office visit in 6 months.  He will call for symptoms of anemia.  Betsy Coder, MD  09/23/2022  3:04 PM

## 2022-09-24 ENCOUNTER — Telehealth: Payer: Self-pay

## 2022-09-24 ENCOUNTER — Other Ambulatory Visit: Payer: Self-pay

## 2022-09-24 DIAGNOSIS — D500 Iron deficiency anemia secondary to blood loss (chronic): Secondary | ICD-10-CM

## 2022-09-24 NOTE — Telephone Encounter (Signed)
Patient gave verbal understanding and had no further questions or concerns  

## 2022-09-24 NOTE — Telephone Encounter (Signed)
-----   Message from Ladell Pier, MD sent at 09/23/2022  5:13 PM EDT ----- Please call patient, the ferritin returned low, continue ferrous sulfate 3 times daily, return in 1 month for a repeat CBC and ferritin level, we will administer IV iron if the ferritin again returns low, call for symptoms of anemia

## 2022-09-30 IMAGING — DX DG CHEST 2V
2 series · 2 of 2 positions shown · non-contrast
Comparison: 06/01/2020

CLINICAL DATA: Cough for the past 2 years.

EXAM:
CHEST - 2 VIEW

[chest pa]
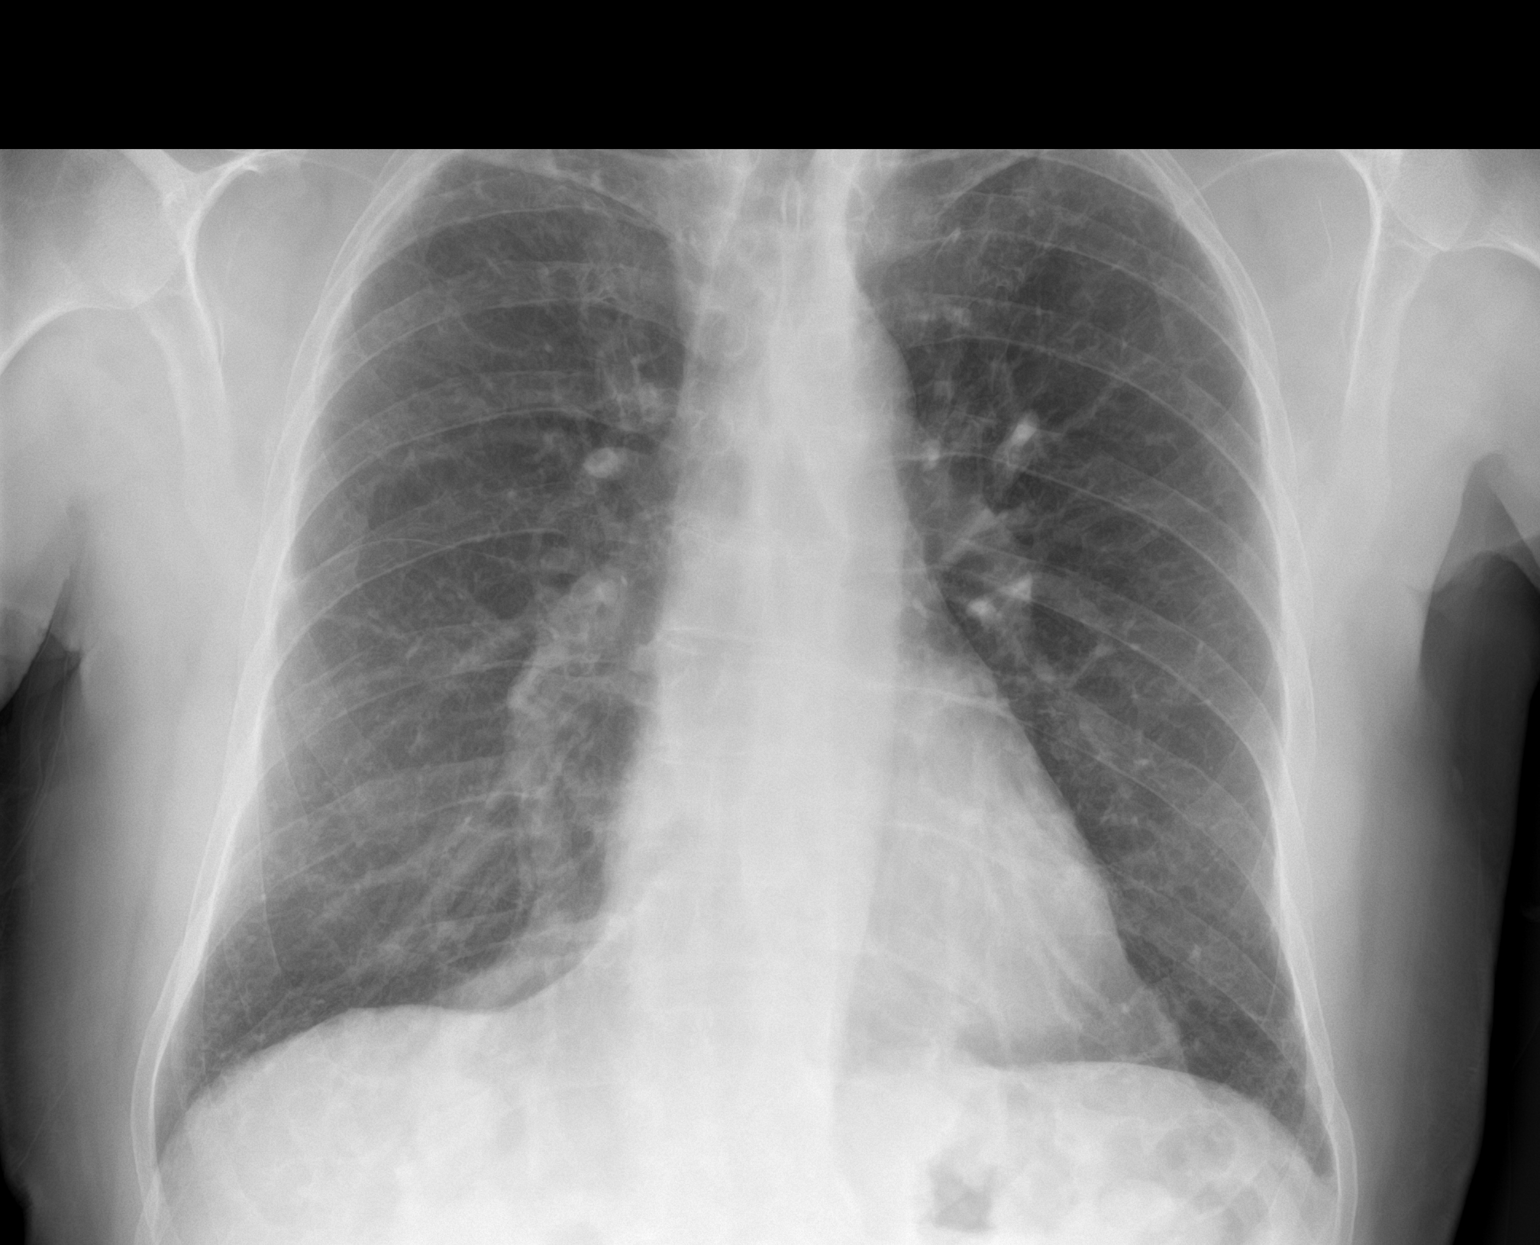

[chest lat]
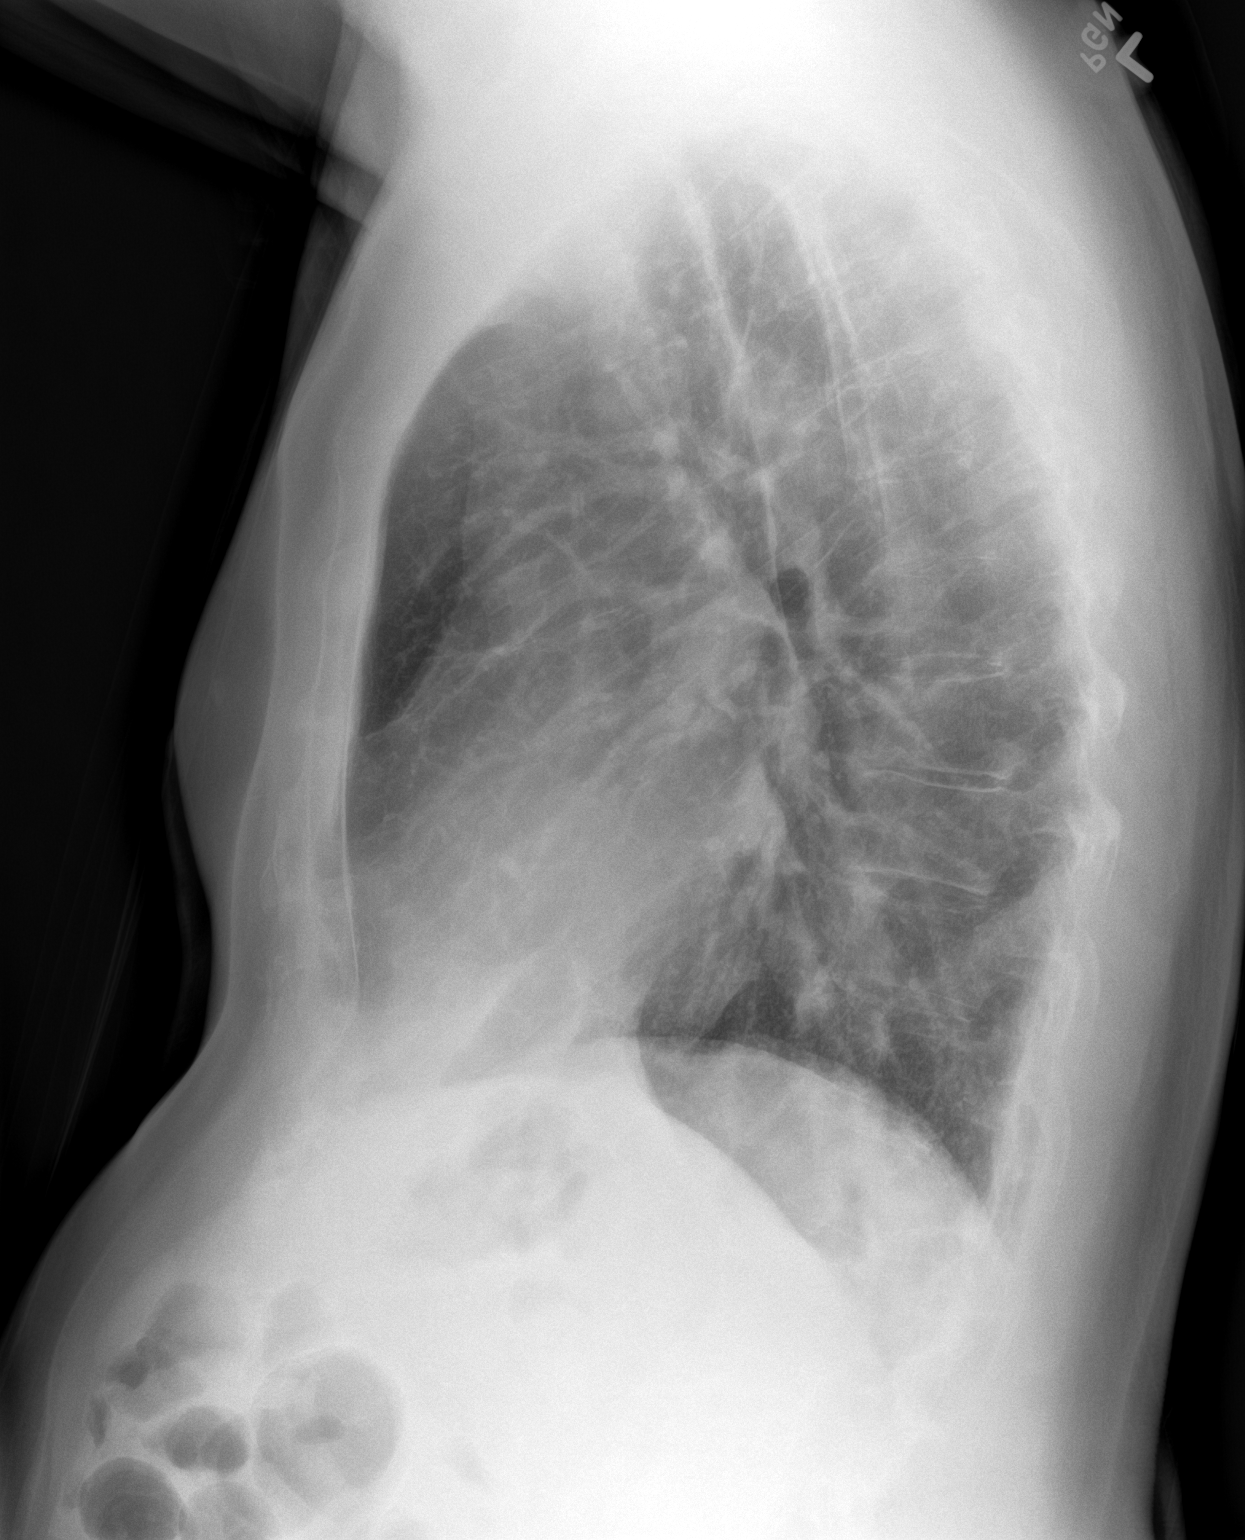

[2 of 2 positions shown; findings below may reference images not displayed]

FINDINGS: Previously noted pulmonary AVM at the medial right lung base.
Biapical pleural and parenchymal scarring. Otherwise, clear lungs.
Normal sized heart. Unremarkable bones.
IMPRESSION: No acute abnormality.

## 2022-10-06 ENCOUNTER — Other Ambulatory Visit (HOSPITAL_BASED_OUTPATIENT_CLINIC_OR_DEPARTMENT_OTHER): Payer: Self-pay

## 2022-10-06 MED ORDER — COMIRNATY 30 MCG/0.3ML IM SUSY
PREFILLED_SYRINGE | INTRAMUSCULAR | 0 refills | Status: DC
  Filled 2022-10-06: qty 0.3, 1d supply, fill #0

## 2022-10-07 NOTE — Progress Notes (Signed)
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    Admitted 5/16 with headache and right lower extremity weakness. CT of the head demonstrated a brain mass measuring 3 x 2 cm with midline shift and edema.  This was a ring enhancing mass in the left basal ganglia.  He underwent stereotactic biopsy by neurosurgery. He was diagnosed with a brain abscess.  Notes from ID indicate his cultures grew out Peptostreptococcus sp.  Patient was followed by infectious disease in the hospital and placed on vancomycin, Rocephin and Flagyl.  He was also placed on Keppra for seizure prophylaxis. 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    Following DC, patient had an episode of elevated heart rate. EMS was called and he was noted to be in atrial flutter. This converted to sinus rhythm spontaneously. Patient placed on toprol. Event monitor showed slow atrial flutter versus ectopic atrial tachycardia.    Monitor July 2021 showed sinus bradycardia, normal sinus rhythm and sinus tachycardia.  Patient did have symptoms of dizziness with monitor in place.  Chest CT December 2021 showed 5.1 cm pulmonary AVM, 0.5 cm pulmonary AVM in the posterior left lower lobe, nonspecific subpleural reticulation throughout both lungs. Echocardiogram repeated June 2022 and showed normal LV function, normal diastolic function.  Chest CT August 2022 showed stable right lower lobe pulmonary AVM. Previously complained of dyspnea.  Hemoglobin found to be 6.8 and he was transfused and placed on iron with improvement.  Carotid Dopplers August 2023 showed no significant stenosis.  Since he was last seen the patient has dyspnea with more extreme activities but not with routine activities. It is relieved with rest. It is not associated with chest pain. There is no orthopnea, PND or pedal  edema. There is no syncope or palpitations. There is no exertional chest pain.   Current Outpatient Medications  Medication Sig Dispense Refill   acetaminophen (TYLENOL) 650 MG CR tablet Take 1,300 mg by mouth 2 (two) times daily.     amoxicillin (AMOXIL) 500 MG capsule SMARTSIG:4 Capsule(s) By Mouth PRN for dental visits 4 capsule 6   Ascorbic Acid (VITAMIN C) 1000 MG tablet 1 tablet     Cholecalciferol 125 MCG (5000 UT) capsule Take 5,000 Units by mouth daily.     clidinium-chlordiazePOXIDE (LIBRAX) 5-2.5 MG capsule Take 1 capsule by mouth 3 (three) times daily before meals. 90 capsule 3   clorazepate (TRANXENE-T) 7.5 MG tablet Take 1 tablet (7.5 mg total) by mouth at bedtime as needed for anxiety. 30 tablet 3   COVID-19 mRNA vaccine 2023-2024 (COMIRNATY) syringe Inject into the muscle. 0.3 mL 0   docusate sodium (COLACE) 100 MG capsule Take 200 mg by mouth at bedtime.     fluorometholone (FML) 0.1 % ophthalmic suspension SMARTSIG:1 Drop(s) In Eye(s) 3 Times Daily PRN     fluorouracil (EFUDEX) 5 % cream daily     gabapentin (NEURONTIN) 100 MG capsule Take 1 capsule (100 mg total) by mouth 2 (two) times daily. 180 each 3   Iron, Ferrous Sulfate, 325 (65 Fe) MG TABS Take 325 mg by mouth in the morning, at noon, and at bedtime. 30 tablet 2   levocetirizine (XYZAL) 5 MG tablet Take 1 tablet (5 mg total) by mouth every evening. 90 tablet 1   levothyroxine (SYNTHROID) 150 MCG tablet Take 1 tablet (150 mcg total) by mouth daily before  breakfast. 90 tablet 3   Magnesium 250 MG TABS Take 250 mg by mouth at bedtime.      meclizine (ANTIVERT) 25 MG tablet Take 25 mg by mouth 2 (two) times daily as needed for dizziness.     methocarbamol (ROBAXIN) 500 MG tablet Take 1 tablet (500 mg total) by mouth every 8 (eight) hours as needed for muscle spasms. 270 tablet 0   metoprolol tartrate (LOPRESSOR) 25 MG tablet TAKE 1/2 TABLET (12.5 MG) AS NEEDED FOR AFIB. 45 tablet 2   montelukast (SINGULAIR) 10 MG tablet  TAKE 1 TABLET BY MOUTH AT  BEDTIME 90 tablet 3   Multiple Vitamin (MULTIVITAMIN WITH MINERALS) TABS tablet Take 1 tablet by mouth every morning.     pantoprazole (PROTONIX) 40 MG tablet TAKE 1 TABLET BY MOUTH TWICE  DAILY 180 tablet 3   silodosin (RAPAFLO) 8 MG CAPS capsule Take 1 capsule (8 mg total) by mouth at bedtime. TAKE ONE CAPSULE BY MOUTH DAILY AT BEDTIME 90 capsule 1   valsartan (DIOVAN) 40 MG tablet TAKE 1 TABLET BY MOUTH EVERY DAY 90 tablet 1   No current facility-administered medications for this visit.     Past Medical History:  Diagnosis Date   Anxiety state, unspecified    Arthritis    BACK PAIN, LUMBAR 02/08/2008   Qualifier: Diagnosis of  By: Julien Girt CMA, Leigh     Benign neoplasm of colon    Bilateral leg edema 03/19/2021   GERD (gastroesophageal reflux disease)    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  WITHOUT HEMOPTYSIS   History of nonmelanoma skin cancer EXCISION SQUAMOUS CELL FROM HAND   Hypertrophy of prostate with urinary obstruction and other lower urinary tract symptoms (LUTS)    Irritable bowel syndrome    Lumbago    Other diseases of nasal cavity and sinuses(478.19)    Persistent disorder of initiating or maintaining sleep    Plantar fascial fibromatosis    Pure hypercholesterolemia    Telangiectasia, hereditary hemorrhagic, of Rendu, 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)   SKIN, LIPS, NASAL W/ PREVIOUS NOSE BLEEDS  AMD GI TELANGIECTASIA   Unspecified hypothyroidism     Past Surgical History:  Procedure Laterality Date   APPLICATION OF CRANIAL NAVIGATION N/A 05/16/2015   Procedure: APPLICATION OF CRANIAL NAVIGATION;  Surgeon: Consuella Lose, MD;  Location: [facility];  Service: Neurosurgery;  Laterality: N/A;   BRAIN BIOPSY Left 05/16/2015   Procedure: Stereotactic Left Brain Biopsy with Brain Lab;  Surgeon: Consuella Lose, MD;  Location: [facility];  Service: Neurosurgery;  Laterality: Left;  Stereotactic Left Brain  biopsy with brainlab   BRAIN SURGERY     CARDIOVASCULAR STRESS TEST  01-14-2003   NO ISCHEMIA / EF 61%/ NORMAL LE WALL MOTION   EXCISION LEFT WRIST GANGLIAN/ MYXOID CYST  05-30-2009   IR RADIOLOGIST EVAL & MGMT  02/01/2019   NASAL SINUS SURGERY     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 Renda Rolls   TRANSTHORACIC ECHOCARDIOGRAM  10-17-2008   DR Joson Sapp   NORMAL LVF/ EF 60%/  MILDLY DILATED RIGHT ATRIUM/ VENTRICULE   VARICOCELECTOMY  1991   Dr. Tresa Endo    Social History   Socioeconomic History   Marital status: Married    Spouse name: [name]   Number  of children: 2   Years of education: Not on file   Highest education level: Master's degree (e.g., MA, MS, MEng, MEd, MSW, MBA)  Occupational History    Comment: Consultant  Tobacco Use   Smoking status: Never   Smokeless tobacco: Never  Vaping Use   Vaping Use: Never used  Substance and Sexual Activity   Alcohol use: No    Alcohol/week: 0.0 standard drinks of alcohol   Drug use: No   Sexual activity: Not on file  Other Topics Concern   Not on file  Social History Narrative   Lives with wife in a one story home.  Has 2 sons.  Retired Veterinary surgeon.  Education: Masters degree.      05/09/19- Pt states that his balance is worse since last visit. He has not fallen but has come close on a few occassions. The weakness on his right side is the same, he states.      Right Handed   Drinks caffeine    Social Determinants of Health   Financial Resource Strain: Low Risk  (11/24/2020)   Overall Financial Resource Strain (CARDIA)    Difficulty of Paying Living Expenses: Not hard at all  Food Insecurity: Not on file  Transportation Needs: Not on file  Physical Activity: Not on file  Stress: Not on file  Social Connections: Not on file  Intimate Partner Violence: Not on file     Family History  Problem Relation Age of Onset   Diabetes Mother    Hypertension Mother    Pancreatic cancer Mother    Stroke Mother    Kidney failure Father    Hypertension Sister    Healthy Son     ROS: no fevers or chills, productive cough, hemoptysis, dysphasia, odynophagia, melena, hematochezia, dysuria, hematuria, rash, seizure activity, orthopnea, PND, pedal edema, claudication. Remaining systems are negative.  Physical Exam: Well-developed well-nourished in no acute distress.  Skin is warm and dry.  HEENT is normal.  Neck is supple.  Chest is clear to auscultation with normal expansion.  Cardiovascular exam is regular rate and rhythm.  Abdominal exam nontender or distended. No masses palpated. Extremities show no edema. neuro grossly intact   A/P  1 history of atrial flutter versus ectopic atrial tachycardia-no recurrences by report.  He has not been anticoagulated given history of Osler Weber Rendu and AV malformation/GI bleeding.  2 hyperlipidemia-followed by primary care.  3 history of orthostasis-he will continue increased fluid intake and sodium intake.  No recent symptoms.  Kirk Ruths, MD

## 2022-10-21 ENCOUNTER — Encounter: Payer: Self-pay | Admitting: Cardiology

## 2022-10-21 ENCOUNTER — Ambulatory Visit: Payer: Medicare Other | Attending: Cardiology | Admitting: Cardiology

## 2022-10-21 VITALS — BP 128/50 | HR 70 | Ht 70.0 in | Wt 183.8 lb

## 2022-10-21 DIAGNOSIS — I10 Essential (primary) hypertension: Secondary | ICD-10-CM | POA: Diagnosis not present

## 2022-10-21 DIAGNOSIS — Q2572 Congenital pulmonary arteriovenous malformation: Secondary | ICD-10-CM | POA: Insufficient documentation

## 2022-10-21 DIAGNOSIS — E7800 Pure hypercholesterolemia, unspecified: Secondary | ICD-10-CM | POA: Diagnosis not present

## 2022-10-21 DIAGNOSIS — I4892 Unspecified atrial flutter: Secondary | ICD-10-CM | POA: Insufficient documentation

## 2022-10-21 NOTE — Patient Instructions (Signed)
  Follow-Up: At [facility], you and your health needs are our priority.  As part of our continuing mission to provide you with exceptional heart care, we have created designated Provider Care Teams.  These Care Teams include your primary Cardiologist (physician) and Advanced Practice Providers (APPs -  Physician Assistants and Nurse Practitioners) who all work together to provide you with the care you need, when you need it.  We recommend signing up for the patient portal called "MyChart".  Sign up information is provided on this After Visit Summary.  MyChart is used to connect with patients for Virtual Visits (Telemedicine).  Patients are able to view lab/test results, encounter notes, upcoming appointments, etc.  Non-urgent messages can be sent to your provider as well.   To learn more about what you can do with MyChart, go to [email or web address].    Your next appointment:   6 month(s)  The format for your next appointment:   In Person  Provider:   Brian Crenshaw, MD    

## 2022-10-25 ENCOUNTER — Other Ambulatory Visit (HOSPITAL_BASED_OUTPATIENT_CLINIC_OR_DEPARTMENT_OTHER): Payer: Self-pay

## 2022-10-25 ENCOUNTER — Inpatient Hospital Stay: Payer: Medicare Other | Attending: Nurse Practitioner

## 2022-10-25 DIAGNOSIS — D500 Iron deficiency anemia secondary to blood loss (chronic): Secondary | ICD-10-CM

## 2022-10-25 DIAGNOSIS — D509 Iron deficiency anemia, unspecified: Secondary | ICD-10-CM | POA: Insufficient documentation

## 2022-10-25 LAB — CBC WITH DIFFERENTIAL (CANCER CENTER ONLY)
Abs Immature Granulocytes: 0.01 10*3/uL (ref 0.00–0.07)
Basophils Absolute: 0 10*3/uL (ref 0.0–0.1)
Basophils Relative: 0 %
Eosinophils Absolute: 0.1 10*3/uL (ref 0.0–0.5)
Eosinophils Relative: 1 %
HCT: 44.1 % (ref 39.0–52.0)
Hemoglobin: 15.4 g/dL (ref 13.0–17.0)
Immature Granulocytes: 0 %
Lymphocytes Relative: 36 %
Lymphs Abs: 2.6 10*3/uL (ref 0.7–4.0)
MCH: 31 pg (ref 26.0–34.0)
MCHC: 34.9 g/dL (ref 30.0–36.0)
MCV: 88.7 fL (ref 80.0–100.0)
Monocytes Absolute: 0.8 10*3/uL (ref 0.1–1.0)
Monocytes Relative: 11 %
Neutro Abs: 3.7 10*3/uL (ref 1.7–7.7)
Neutrophils Relative %: 52 %
Platelet Count: 295 10*3/uL (ref 150–400)
RBC: 4.97 MIL/uL (ref 4.22–5.81)
RDW: 13 % (ref 11.5–15.5)
WBC Count: 7.1 10*3/uL (ref 4.0–10.5)
nRBC: 0 % (ref 0.0–0.2)

## 2022-10-25 LAB — FERRITIN: Ferritin: 32 ng/mL (ref 24–336)

## 2022-10-25 MED ORDER — ZOSTER VAC RECOMB ADJUVANTED 50 MCG/0.5ML IM SUSR
INTRAMUSCULAR | 0 refills | Status: DC
  Filled 2022-10-25: qty 0.5, 1d supply, fill #0

## 2022-10-26 ENCOUNTER — Telehealth: Payer: Self-pay

## 2022-10-26 NOTE — Telephone Encounter (Signed)
-----   Message from Ladell Pier, MD sent at 10/25/2022  7:27 PM EST ----- Please call patient, hemoglobin is normal, iron in low normal range, f/u as scheduled for lab 2 months

## 2022-10-26 NOTE — Telephone Encounter (Signed)
Patient gave verbal understanding and had no further questions or concerns  

## 2022-10-27 ENCOUNTER — Ambulatory Visit (HOSPITAL_BASED_OUTPATIENT_CLINIC_OR_DEPARTMENT_OTHER): Payer: Medicare Other | Admitting: Nurse Practitioner

## 2022-10-28 ENCOUNTER — Encounter (HOSPITAL_BASED_OUTPATIENT_CLINIC_OR_DEPARTMENT_OTHER): Payer: Self-pay | Admitting: Nurse Practitioner

## 2022-10-28 ENCOUNTER — Ambulatory Visit (INDEPENDENT_AMBULATORY_CARE_PROVIDER_SITE_OTHER): Payer: Medicare Other | Admitting: Nurse Practitioner

## 2022-10-28 VITALS — BP 148/60 | HR 68 | Ht 70.0 in | Wt 185.0 lb

## 2022-10-28 DIAGNOSIS — E222 Syndrome of inappropriate secretion of antidiuretic hormone: Secondary | ICD-10-CM | POA: Diagnosis not present

## 2022-10-28 DIAGNOSIS — R2689 Other abnormalities of gait and mobility: Secondary | ICD-10-CM

## 2022-10-28 DIAGNOSIS — M79605 Pain in left leg: Secondary | ICD-10-CM

## 2022-10-28 DIAGNOSIS — M79604 Pain in right leg: Secondary | ICD-10-CM

## 2022-10-28 DIAGNOSIS — E039 Hypothyroidism, unspecified: Secondary | ICD-10-CM | POA: Diagnosis not present

## 2022-10-28 DIAGNOSIS — I5032 Chronic diastolic (congestive) heart failure: Secondary | ICD-10-CM | POA: Diagnosis not present

## 2022-10-28 DIAGNOSIS — R42 Dizziness and giddiness: Secondary | ICD-10-CM

## 2022-10-28 DIAGNOSIS — I780 Hereditary hemorrhagic telangiectasia: Secondary | ICD-10-CM | POA: Diagnosis not present

## 2022-10-28 DIAGNOSIS — E098 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: Secondary | ICD-10-CM

## 2022-10-28 DIAGNOSIS — I700 Atherosclerosis of aorta: Secondary | ICD-10-CM

## 2022-10-28 DIAGNOSIS — E7800 Pure hypercholesterolemia, unspecified: Secondary | ICD-10-CM

## 2022-10-28 DIAGNOSIS — J849 Interstitial pulmonary disease, unspecified: Secondary | ICD-10-CM

## 2022-10-28 HISTORY — DX: Pain in right leg: M79.604

## 2022-10-28 HISTORY — DX: Other abnormalities of gait and mobility: R26.89

## 2022-10-28 MED ORDER — MECLIZINE HCL 25 MG PO TABS: 25.0000 mg | ORAL_TABLET | Freq: Two times a day (BID) | ORAL | 6 refills | Status: DC | PRN

## 2022-10-28 NOTE — Assessment & Plan Note (Signed)
Chronic.  On current statin therapy.  No alarm symptoms present at this time.  Consider lower extremity atherosclerosis possible contributing factor for the lower extremity pain however he is not experiencing symptoms of claudication, discoloration, or edema at this time.  Recommend continuation of treatment with diet and medications.

## 2022-10-28 NOTE — Assessment & Plan Note (Signed)
Chronic.  Lungs clear on auscultation today.  Oxygenation appropriate.  No alarm symptoms are present.  Continue current treatment measures and monitoring.

## 2022-10-28 NOTE — Assessment & Plan Note (Signed)
Labs reviewed today.  No alarm symptoms present.  Continue with diet exercise

## 2022-10-28 NOTE — Assessment & Plan Note (Signed)
Bilateral lower extremity weakness and pain with muscle use.  Etiology unclear at this time.  We will obtain labs today for further evaluation to ensure that thyroid and electrolytes are well-balanced.  Patient's strength appears fairly well intact with no significant deficit noted.  We will also send referral for physical therapy for assistance with balance and gait training.  We will make changes to plan of care based on findings as appropriate.

## 2022-10-28 NOTE — Progress Notes (Signed)
Worthy Keeler, DNP, AGNP-c [address] [place], [place] [address] [phone] Office [phone] Fax  ESTABLISHED PATIENT- Chronic Health and/or Follow-Up Visit  Blood pressure (!) 148/60, pulse 68, height '5\' 10"'$  (1.778 m), weight 185 lb (83.9 kg), SpO2 94 %.    Gregory [name] is a 81 y.o. year old male presenting today for evaluation and management of the following: Follow-up (Patient presents today for follow up. Ferratin was low when Dr Benay Spice. He has aches and pains all the time. )  Muscle aches Gregory [name] endorses concerns with aching in his thighs and feet bilaterally on a recurrent basis.  He tells me that there are occasions where he is also experiencing this same sensation in his upper extremities.  He tells me that his legs and arms feel weak and heavy.  He endorses difficulty getting up from the floor when in a kneeling position or when lifting objects he feels that he weakens much quicker than he expects to.  He is not sure if this is related to the aging process or if there is something wrong however it is significantly impacting his daily quality of life and ability to maneuver around like he wants to.  He endorses the pain is present primarily when he is using the muscles, not at rest.  Low ferritin Gregory [name] was recently seen with Dr. Benay Spice in oncology for laboratory evaluation of his iron levels.  On initial visit his ferritin had dropped to undetectable status.  Gregory [name] tells me that during this time he made no changes and had continued to take his oral iron supplement 3 times a day as he had previously been doing.  He was instructed to return for repeat labs in 30 days, which he completed.  These labs showed improvement of the ferritin levels into the low normal range.  Gregory [name] is concerned that there is not greater improvement given the amount of iron supplementation he is taking on a daily basis.  He is not noticing any signs  of bleeding.  His hemoglobin levels have remained stable and within the normal range. Improved after a month  Hypertension  Gregory [name] endorses that his blood pressures have been well controlled.  He is monitoring his blood pressure at home and typically his readings are in the mid 120s over 73s.  Before he came into the office today this at home blood pressure reading was 129/58.  He tells me that the highest he ever sees his blood pressure is in the low 706C systolically.  He is taking his medication as prescribed without any missed doses.  He denies any side effects from his medications.  Dizziness Gregory [name] endorses ongoing concerns with dizziness and balance issues.  He tells me that meclizine does help with some of the dizzy symptoms however he still notices that his balance is quite off.  He feels particularly off balance when he is trying to move backwards.  He tells me he frequently reaches for the wall to help stabilize himself while moving around.  Previously he was in physical therapy and he felt like this was helpful however he had to stop when he developed an irregular heart rhythm.  He is interested in restarting this and would like to [facility] as this is close to the home and he has used this facility before with excellent results.  He denies any new or unusual symptoms associated with the dizziness.     All  ROS negative with exception of what is listed above.   PHYSICAL EXAM Physical Exam Vitals and nursing note reviewed.  Constitutional:      Appearance: Normal appearance.  HENT:     Head: Normocephalic and atraumatic.     Right Ear: Tympanic membrane normal.     Left Ear: Tympanic membrane normal.  Eyes:     Extraocular Movements: Extraocular movements intact.     Conjunctiva/sclera: Conjunctivae normal.     Pupils: Pupils are equal, round, and reactive to light.  Neck:     Vascular: No carotid bruit.  Cardiovascular:     Rate and Rhythm: Normal rate  and regular rhythm.     Pulses: Normal pulses.     Heart sounds: Normal heart sounds.  Pulmonary:     Effort: Pulmonary effort is normal.     Breath sounds: Normal breath sounds.  Abdominal:     General: Bowel sounds are normal.     Palpations: Abdomen is soft.  Musculoskeletal:        General: Normal range of motion.     Cervical back: No tenderness.     Right lower leg: No edema.     Left lower leg: No edema.  Lymphadenopathy:     Cervical: No cervical adenopathy.  Skin:    General: Skin is warm and dry.     Capillary Refill: Capillary refill takes less than 2 seconds.  Neurological:     General: No focal deficit present.     Mental Status: He is alert and oriented to person, place, and time.     Sensory: No sensory deficit.     Motor: No weakness.     Coordination: Coordination normal.     Gait: Gait normal.  Psychiatric:        Mood and Affect: Mood normal.        Behavior: Behavior normal.        Thought Content: Thought content normal.        Judgment: Judgment normal.     PLAN Problem List Items Addressed This Visit     Hypothyroidism - Primary    Chronic.  Currently on levothyroxine.  Consider possible alteration in thyroid levels to be a contributing factor to the weakness he is experiencing.  We will obtain labs for monitoring today as he has recently had some alteration in these levels.  We will make changes to plan of care based on findings.      Relevant Orders   Comprehensive metabolic panel   TSH   T4, free   HYPERCHOLESTEROLEMIA, MILD    Chronic.  Doing very well with diet.  No alarm symptoms present at this time.  Continue current regimen.      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  Review of most recent labs show good hemoglobin levels.  I am concerned with the variation in his ferritin levels despite ongoing oral iron 3 times a day.  Recommend continuation of monitoring with hematology.  Monitor closely for  any signs of bleeding.  Continue to avoid NSAIDs and blood thinners.      Relevant Orders   Comprehensive metabolic panel   Dizziness    Chronic intermittent periods of dizziness.  Patient typically controls very well with meclizine without side effect.  We will refill this medication today.  We will also send for physical therapy for balance and gait training recommend evaluation for vestibular therapy as well.  No alarm  symptoms present at this time.      SIADH (syndrome of inappropriate ADH production) (HCC)    Chronic.  Unclear if decrease in sodium levels could be contributing to the intermittent dizziness.  Will obtain labs today for evaluation.  Continue with high sodium diet and close monitoring.      Relevant Orders   Comprehensive metabolic panel   Atherosclerosis of aorta (HCC)    Chronic.  On current statin therapy.  No alarm symptoms present at this time.  Consider lower extremity atherosclerosis possible contributing factor for the lower extremity pain however he is not experiencing symptoms of claudication, discoloration, or edema at this time.  Recommend continuation of treatment with diet and medications.      Chronic diastolic heart failure (HCC)    Chronic.  Well-controlled at this time.  No alarm symptoms present.  Refills provided on medications today.  CMP ordered.      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  No alarm symptoms present.  Continue with diet exercise      Interstitial lung disease (HCC)    Chronic.  Lungs clear on auscultation today.  Oxygenation appropriate.  No alarm symptoms are present.  Continue current treatment measures and monitoring.      Balance problem    Concerns with balance issues in the setting of lower extremity muscle weakness of unknown etiology.  Intermittent vertigo symptoms also playing a role in this.  At this  time I do feel that return to therapy would be very beneficial for gait training, strengthening, and vestibular evaluation.  We will send referral to [facility].  No alarm symptoms present at this time.      Relevant Medications   meclizine (ANTIVERT) 25 MG tablet   Other Relevant Orders   Ambulatory referral to Physical Therapy   Pain in both lower extremities    Bilateral lower extremity weakness and pain with muscle use.  Etiology unclear at this time.  We will obtain labs today for further evaluation to ensure that thyroid and electrolytes are well-balanced.  Patient's strength appears fairly well intact with no significant deficit noted.  We will also send referral for physical therapy for assistance with balance and gait training.  We will make changes to plan of care based on findings as appropriate.      Relevant Orders   Ambulatory referral to Physical Therapy    Return in about 6 months (around 04/28/2023) for Chronic Condition Management.   Worthy Keeler, DNP, AGNP-c 10/28/2022  1:13 PM

## 2022-10-28 NOTE — Assessment & Plan Note (Signed)
Chronic.  Unclear if decrease in sodium levels could be contributing to the intermittent dizziness.  Will obtain labs today for evaluation.  Continue with high sodium diet and close monitoring.

## 2022-10-28 NOTE — Assessment & Plan Note (Signed)
Chronic.  Doing very well with diet.  No alarm symptoms present at this time.  Continue current regimen.

## 2022-10-28 NOTE — Patient Instructions (Signed)
I have sent the referral in for you to [facility] and requested the [facility] location. Hopefully this will help get the balance issues improved.   I want to check and make sure that your thyroid is not off causing some of the leg pain. Sometimes this can really make a difference. Your strength was good today. If this is not the issue we will come up with a new plan.

## 2022-10-28 NOTE — Assessment & Plan Note (Signed)
Chronic.  Currently on levothyroxine.  Consider possible alteration in thyroid levels to be a contributing factor to the weakness he is experiencing.  We will obtain labs for monitoring today as he has recently had some alteration in these levels.  We will make changes to plan of care based on findings.

## 2022-10-28 NOTE — Assessment & Plan Note (Signed)
No signs of active bleeding at this time.  Review of most recent labs show good hemoglobin levels.  I am concerned with the variation in his ferritin levels despite ongoing oral iron 3 times a day.  Recommend continuation of monitoring with hematology.  Monitor closely for any signs of bleeding.  Continue to avoid NSAIDs and blood thinners.

## 2022-10-28 NOTE — Assessment & Plan Note (Signed)
Concerns with balance issues in the setting of lower extremity muscle weakness of unknown etiology.  Intermittent vertigo symptoms also playing a role in this.  At this time I do feel that return to therapy would be very beneficial for gait training, strengthening, and vestibular evaluation.  We will send referral to [facility].  No alarm symptoms present at this time.

## 2022-10-28 NOTE — Assessment & Plan Note (Signed)
Chronic intermittent periods of dizziness.  Patient typically controls very well with meclizine without side effect.  We will refill this medication today.  We will also send for physical therapy for balance and gait training recommend evaluation for vestibular therapy as well.  No alarm symptoms present at this time.

## 2022-10-28 NOTE — Assessment & Plan Note (Signed)
Chronic.  Well-controlled at this time.  No alarm symptoms present.  Refills provided on medications today.  CMP ordered.

## 2022-10-29 LAB — COMPREHENSIVE METABOLIC PANEL
ALT: 18 IU/L (ref 0–44)
AST: 22 IU/L (ref 0–40)
Albumin/Globulin Ratio: 2 (ref 1.2–2.2)
Albumin: 4.3 g/dL (ref 3.8–4.8)
Alkaline Phosphatase: 86 IU/L (ref 44–121)
BUN/Creatinine Ratio: 14 (ref 10–24)
BUN: 13 mg/dL (ref 8–27)
Bilirubin Total: <0.2 mg/dL (ref 0.0–1.2)
CO2: 24 mmol/L (ref 20–29)
Calcium: 9.1 mg/dL (ref 8.6–10.2)
Chloride: 96 mmol/L (ref 96–106)
Creatinine, Ser: 0.91 mg/dL (ref 0.76–1.27)
Globulin, Total: 2.1 g/dL (ref 1.5–4.5)
Glucose: 102 mg/dL — ABNORMAL HIGH (ref 70–99)
Potassium: 4.8 mmol/L (ref 3.5–5.2)
Sodium: 133 mmol/L — ABNORMAL LOW (ref 134–144)
Total Protein: 6.4 g/dL (ref 6.0–8.5)
eGFR: 85 mL/min/{1.73_m2} (ref >59)

## 2022-10-29 LAB — TSH: TSH: 1.17 u[IU]/mL (ref 0.450–4.500)

## 2022-10-29 LAB — T4, FREE: Free T4: 1.48 ng/dL (ref 0.82–1.77)

## 2022-11-26 ENCOUNTER — Other Ambulatory Visit: Payer: Self-pay

## 2022-11-26 ENCOUNTER — Emergency Department (HOSPITAL_BASED_OUTPATIENT_CLINIC_OR_DEPARTMENT_OTHER)
Admission: EM | Admit: 2022-11-26 | Discharge: 2022-11-26 | Disposition: A | Discharge status: Home / Self Care | Payer: Medicare Other | Attending: Emergency Medicine | Admitting: Emergency Medicine

## 2022-11-26 ENCOUNTER — Encounter (HOSPITAL_BASED_OUTPATIENT_CLINIC_OR_DEPARTMENT_OTHER): Payer: Self-pay | Admitting: Emergency Medicine

## 2022-11-26 ENCOUNTER — Emergency Department (HOSPITAL_BASED_OUTPATIENT_CLINIC_OR_DEPARTMENT_OTHER): Payer: Medicare Other

## 2022-11-26 DIAGNOSIS — E039 Hypothyroidism, unspecified: Secondary | ICD-10-CM | POA: Insufficient documentation

## 2022-11-26 DIAGNOSIS — Z79899 Other long term (current) drug therapy: Secondary | ICD-10-CM | POA: Diagnosis not present

## 2022-11-26 DIAGNOSIS — S93492A Sprain of other ligament of left ankle, initial encounter: Secondary | ICD-10-CM | POA: Diagnosis not present

## 2022-11-26 DIAGNOSIS — X509XXA Other and unspecified overexertion or strenuous movements or postures, initial encounter: Secondary | ICD-10-CM | POA: Diagnosis not present

## 2022-11-26 DIAGNOSIS — Z85828 Personal history of other malignant neoplasm of skin: Secondary | ICD-10-CM | POA: Diagnosis not present

## 2022-11-26 DIAGNOSIS — S99912A Unspecified injury of left ankle, initial encounter: Secondary | ICD-10-CM | POA: Diagnosis present

## 2022-11-26 HISTORY — DX: Carcinoma in situ of skin of scalp and neck: D04.4

## 2022-11-26 NOTE — Discharge Instructions (Addendum)
Mr. Trettin,  I am so sorry about your fall.  It does not look like you fractured anything.  I suspect that your symptoms are due to a sprain of the ankle.  These can be quite painful but they do get better with time and gentle return to activity.  If you choose to continue wearing the cam walking boot, I do not want you to use this for more than 2 weeks.  If you choose to buy a smaller lace up style brace from the drugstore, this allows more range of motion within the ankle while also giving you the reassurance of knowing that something is there.  You can continue to use Tylenol as needed for pain.  Pearla Dubonnet, MD

## 2022-11-26 NOTE — ED Provider Notes (Signed)
[facility] EMERGENCY DEPT Provider Note   CSN: [id number] Arrival date & time: 11/26/22  1752     History Chief Complaint  Patient presents with   Foot Injury    Gregory [name] is a 81 y.o. male.  Patient was working in the garden when he rolled his ankle off of the sidewalk.  Did not feel an immediate pop or crack or anything he has been able to ambulate since the event.  Had a cam walker at home that he has placed himself in.  Took some Tylenol for pain control with good response.  Does not have a history of issues with that ankle though did have plantar fasciitis of left foot a few years back..          Home Medications Prior to Admission medications   Medication Sig Start Date End Date Taking? Authorizing Provider  acetaminophen (TYLENOL) 650 MG CR tablet Take 1,300 mg by mouth 2 (two) times daily.    [provider]  amoxicillin (AMOXIL) 500 MG capsule SMARTSIG:4 Capsule(s) By Mouth PRN for dental visits 09/06/22   Early, [name], NP  Ascorbic Acid (VITAMIN C) 1000 MG tablet 1 tablet    [provider]  Cholecalciferol 125 MCG (5000 UT) capsule Take 5,000 Units by mouth daily.    [provider]  clidinium-chlordiazePOXIDE (LIBRAX) 5-2.5 MG capsule Take 1 capsule by mouth 3 (three) times daily before meals. 08/10/22   [name], NP  clorazepate (TRANXENE-T) 7.5 MG tablet Take 1 tablet (7.5 mg total) by mouth at bedtime as needed for anxiety. 09/06/22   [name], NP  COVID-19 mRNA vaccine [phone] (COMIRNATY) syringe Inject into the muscle. Patient not taking: Reported on 10/28/2022 10/06/22     docusate sodium (COLACE) 100 MG capsule Take 200 mg by mouth at bedtime.    [provider]  fluorometholone (FML) 0.1 % ophthalmic suspension SMARTSIG:1 Drop(s) In Eye(s) 3 Times Daily PRN 01/27/22   [provider]  fluorouracil (EFUDEX) 5 % cream daily 10/29/20   [provider]  gabapentin (NEURONTIN) 100 MG  capsule Take 1 capsule (100 mg total) by mouth 2 (two) times daily. 09/06/22   [name], NP  Iron, Ferrous Sulfate, 325 (65 Fe) MG TABS Take 325 mg by mouth in the morning, at noon, and at bedtime. 08/23/21   Barb Merino, MD  levocetirizine (XYZAL) 5 MG tablet Take 1 tablet (5 mg total) by mouth every evening. 06/27/18   Janith Lima, MD  levothyroxine (SYNTHROID) 150 MCG tablet Take 1 tablet (150 mcg total) by mouth daily before breakfast. 05/06/22   Early, [name], NP  Magnesium 250 MG TABS Take 250 mg by mouth at bedtime.     [provider]  meclizine (ANTIVERT) 25 MG tablet Take 1 tablet (25 mg total) by mouth 2 (two) times daily as needed for dizziness. 10/28/22   [name], NP  methocarbamol (ROBAXIN) 500 MG tablet Take 1 tablet (500 mg total) by mouth every 8 (eight) hours as needed for muscle spasms. 10/26/21   Janith Lima, MD  metoprolol tartrate (LOPRESSOR) 25 MG tablet TAKE 1/2 TABLET (12.5 MG) AS NEEDED FOR AFIB. 07/14/22   Lelon Perla, MD  montelukast (SINGULAIR) 10 MG tablet TAKE 1 TABLET BY MOUTH AT  BEDTIME 07/02/22   Early, [name], NP  Multiple Vitamin (MULTIVITAMIN WITH MINERALS) TABS tablet Take 1 tablet by mouth every morning.    [provider]  pantoprazole (PROTONIX) 40 MG tablet TAKE 1 TABLET BY MOUTH TWICE  DAILY 07/02/22   Early, [name], NP  silodosin (RAPAFLO) 8 MG CAPS capsule Take 1 capsule (8 mg total) by mouth at bedtime. TAKE ONE CAPSULE BY MOUTH DAILY AT BEDTIME 06/11/22   Early, [name], NP  valsartan (DIOVAN) 40 MG tablet TAKE 1 TABLET BY MOUTH EVERY DAY 08/30/22   Early, [name], NP  Zoster Vaccine Adjuvanted [facility]) injection Inject into the muscle. 10/25/22   Carlyle Basques, MD      Allergies    Nsaids, Other, Aspirin, Statins, Demeclocycline, Dust mite extract, and Tizanidine hcl    Review of Systems   Review of Systems  Constitutional:  Negative for activity change.  Musculoskeletal:  Positive for joint swelling.   Neurological:  Negative for syncope, weakness and headaches.  All other systems reviewed and are negative.   Physical Exam Updated Vital Signs BP (!) 150/74 (BP Location: Right Arm)   Pulse 82   Temp 98 F (36.7 C) (Oral)   Resp 16   SpO2 94%  Physical Exam Vitals reviewed.  Constitutional:      General: He is not in acute distress. HENT:     Mouth/Throat:     Mouth: Mucous membranes are moist.  Eyes:     Extraocular Movements: Extraocular movements intact.  Cardiovascular:     Rate and Rhythm: Regular rhythm.     Pulses: Normal pulses.  Abdominal:     General: Abdomen is flat.     Palpations: Abdomen is soft.  Musculoskeletal:     Cervical back: Neck supple.     Comments: L ankle without obvious deformity.  Mild localized swelling in the area of the ATFL. Malleoli non-tender. Navicular non-tender. Good PT and DP pulses bilaterally. Active ROM minimally limited by pain. No crepitus with manipulation of the joint.   Skin:    General: Skin is warm and dry.     Capillary Refill: Capillary refill takes less than 2 seconds.  Neurological:     General: No focal deficit present.  Psychiatric:        Mood and Affect: Mood normal.     ED Results / Procedures / Treatments   Labs (all labs ordered are listed, but only abnormal results are displayed) Labs Reviewed - No data to display  EKG None  Radiology DG Ankle Complete Left  Result Date: 11/26/2022 CLINICAL DATA:  Trauma, pain EXAM: LEFT ANKLE COMPLETE - 3+ VIEW COMPARISON:  None Available. FINDINGS: No recent fracture or dislocation is seen. Plantar spur is seen in calcaneus. Minimal bony spurs are noted in the dorsal aspect of talonavicular joint. Vascular calcifications are seen in soft tissues. IMPRESSION: No recent fracture or dislocation is seen in left ankle. Plantar spur is seen in calcaneus. Electronically Signed   By: Elmer Picker M.D.   On: 11/26/2022 18:28   DG Foot Complete Left  Result Date:  11/26/2022 CLINICAL DATA:  Trauma, pain EXAM: LEFT FOOT - COMPLETE 3+ VIEW COMPARISON:  None Available. FINDINGS: No displaced fracture or dislocation is seen. Plantar spur is seen in calcaneus. Vascular calcifications are seen in soft tissues. IMPRESSION: No recent displaced fracture or dislocation is seen in left foot. Electronically Signed   By: Elmer Picker M.D.   On: 11/26/2022 18:26    Procedures Procedures   Medications Ordered in ED Medications - No data to display  ED Course/ Medical Decision Making/ A&P  Medical Decision Making X ray of the left foot without evidence of fracture. 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  No reason to believe that he is bleeding intra-articularly this time given relatively minimal swelling.  History and exam consistent with an ATFL sprain of the ankle.  He has already placed himself in a cam walker boot.  Reasonable to continue this for 2 weeks and to follow-up with PCP to decide whether he is appropriate for formal rehab.  Okay to continue Tylenol for pain control.  Amount and/or Complexity of Data Reviewed Radiology: ordered.    Final Clinical Impression(s) / ED Diagnoses Final diagnoses:  Sprain of anterior talofibular ligament of left ankle, initial encounter    Rx / DC Orders ED Discharge Orders     None      Pearla Dubonnet, MD     Eppie Gibson, MD 11/26/22 [place], [place], DO 11/27/22 2014

## 2022-11-26 NOTE — ED Triage Notes (Signed)
Left foot/ ankle injury. Rolled ankle earlier while working in the yard. Boot self applied at home, patient able to ambulate to triage

## 2022-12-23 ENCOUNTER — Telehealth (INDEPENDENT_AMBULATORY_CARE_PROVIDER_SITE_OTHER): Payer: Medicare Other | Admitting: Nurse Practitioner

## 2022-12-23 ENCOUNTER — Encounter: Payer: Self-pay | Admitting: Nurse Practitioner

## 2022-12-23 VITALS — BP 139/60 | HR 76 | Temp 98.0°F | Ht 70.0 in | Wt 179.0 lb

## 2022-12-23 DIAGNOSIS — J392 Other diseases of pharynx: Secondary | ICD-10-CM | POA: Diagnosis not present

## 2022-12-23 DIAGNOSIS — R051 Acute cough: Secondary | ICD-10-CM | POA: Diagnosis not present

## 2022-12-23 MED ORDER — HYDROCODONE BIT-HOMATROP MBR 5-1.5 MG/5ML PO SOLN: 5.0000 mL | Freq: Three times a day (TID) | ORAL | 0 refills | Status: DC | PRN

## 2022-12-23 MED ORDER — AZITHROMYCIN 250 MG PO TABS: ORAL_TABLET | ORAL | 0 refills | Status: DC

## 2022-12-23 MED ORDER — NYSTATIN 100000 UNIT/ML MT SUSP: 5.0000 mL | Freq: Three times a day (TID) | OROMUCOSAL | 3 refills | Status: DC | PRN

## 2022-12-23 MED ORDER — BENZONATATE 200 MG PO CAPS: 200.0000 mg | ORAL_CAPSULE | Freq: Three times a day (TID) | ORAL | 0 refills | Status: DC | PRN

## 2022-12-23 NOTE — Assessment & Plan Note (Signed)
Suspect this initially started as viral, but given the worsening of symptoms I am concerned he has now developed a bacterial infection. I will send treatment today with azithromycin. He has prednsione '40mg'$  daily for 5 days at home that he will start immediately. I am sending in tessalon and hycodan to help with the cough. I recommend using the hycodan at night time only. 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  He will seek emergency care if symptoms become severe. He will follow-up if no improvement.

## 2022-12-23 NOTE — Addendum Note (Signed)
Addended by: Ressie Slevin, Clarise Cruz E on: 12/23/2022 02:20 PM   Modules accepted: Orders

## 2022-12-23 NOTE — Progress Notes (Signed)
Virtual Visit Encounter mychart visit.   I connected with  Gregory [name] on 12/23/22 at 12:00 PM EST by secure video and audio telemedicine application. I verified that I am speaking with the correct person using two identifiers.   I introduced myself as a Designer, jewellery with the practice. The limitations of evaluation and management by telemedicine discussed with the patient and the availability of in person appointments. The patient expressed verbal understanding and consent to proceed.  Participating parties in this visit include: Myself and patient  The patient is: Patient Location: Home I am: Provider Location: Office/Clinic Subjective:    CC and HPI: Gregory [name] is a 82 y.o. year old male presenting for new evaluation and treatment of cough. Patient reports the following: Gregory [name] tells me last Friday he developed a sore throat and sinus drainage. Over the week his symptoms have progressed and his cough has worsened significantly. He tells me it feels like the majority of the symptoms are in his chest at this time. He has not had fevers, but has developed body aches and he is taking tylenol for this. He tells me the cough sounds wet, but nothing is coming up. He has been using mucinex DM. At night he is coughing so much and so hard he tells me it is hard for him to catch his breath.   Past medical history, Surgical history, Family history not pertinant except as noted below, Social history, Allergies, and medications have been entered into the medical record, reviewed, and corrections made.   Review of Systems:  All review of systems negative except what is listed in the HPI  Objective:    Alert and oriented x 4 Cough and congestion present. Cough wet sounding.  Speaking in clear sentences with no shortness of breath. No distress.  Impression and Recommendations:    Problem List Items Addressed This Visit     Acute cough - Primary    Suspect this initially started as  viral, but given the worsening of symptoms I am concerned he has now developed a bacterial infection. I will send treatment today with azithromycin. He has prednsione '40mg'$  daily for 5 days at home that he will start immediately. I am sending in tessalon and hycodan to help with the cough. I recommend using the hycodan at night time only. 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  He will seek emergency care if symptoms become severe. He will follow-up if no improvement.       Relevant Medications   azithromycin (ZITHROMAX) 250 MG tablet   benzonatate (TESSALON) 200 MG capsule   HYDROcodone bit-homatropine (HYCODAN) 5-1.5 MG/5ML syrup   Other Visit Diagnoses     Throat irritation       Relevant Medications   magic mouthwash (nystatin, hydrocortisone, diphenhydrAMINE) suspension       orders and follow up as documented in EMR I discussed the assessment and treatment plan with the patient. The patient was provided an opportunity to ask questions and all were answered. The patient agreed with the plan and demonstrated an understanding of the instructions.   The patient was advised to call back or seek an in-person evaluation if the symptoms worsen or if the condition fails to improve as anticipated.  Follow-Up: prn  I provided 19 minutes of non-face-to-face interaction with this non face-to-face encounter including  intake, same-day documentation, and chart review.   [name], NP , DNP, AGNP-c [facility]

## 2022-12-24 ENCOUNTER — Inpatient Hospital Stay: Payer: Medicare Other | Attending: Nurse Practitioner

## 2022-12-24 DIAGNOSIS — D500 Iron deficiency anemia secondary to blood loss (chronic): Secondary | ICD-10-CM

## 2022-12-24 DIAGNOSIS — D509 Iron deficiency anemia, unspecified: Secondary | ICD-10-CM | POA: Diagnosis present

## 2022-12-24 LAB — CBC WITH DIFFERENTIAL (CANCER CENTER ONLY)
Abs Immature Granulocytes: 0.03 10*3/uL (ref 0.00–0.07)
Basophils Absolute: 0 10*3/uL (ref 0.0–0.1)
Basophils Relative: 0 %
Eosinophils Absolute: 0 10*3/uL (ref 0.0–0.5)
Eosinophils Relative: 0 %
HCT: 43 % (ref 39.0–52.0)
Hemoglobin: 14.8 g/dL (ref 13.0–17.0)
Immature Granulocytes: 0 %
Lymphocytes Relative: 25 %
Lymphs Abs: 2.9 10*3/uL (ref 0.7–4.0)
MCH: 31.2 pg (ref 26.0–34.0)
MCHC: 34.4 g/dL (ref 30.0–36.0)
MCV: 90.7 fL (ref 80.0–100.0)
Monocytes Absolute: 1.4 10*3/uL — ABNORMAL HIGH (ref 0.1–1.0)
Monocytes Relative: 12 %
Neutro Abs: 7.4 10*3/uL (ref 1.7–7.7)
Neutrophils Relative %: 63 %
Platelet Count: 245 10*3/uL (ref 150–400)
RBC: 4.74 MIL/uL (ref 4.22–5.81)
RDW: 13.1 % (ref 11.5–15.5)
WBC Count: 11.8 10*3/uL — ABNORMAL HIGH (ref 4.0–10.5)
nRBC: 0 % (ref 0.0–0.2)

## 2022-12-24 LAB — FERRITIN: Ferritin: 40 ng/mL (ref 24–336)

## 2022-12-25 ENCOUNTER — Other Ambulatory Visit: Payer: Self-pay | Admitting: Family Medicine

## 2022-12-25 MED ORDER — ALBUTEROL SULFATE HFA 108 (90 BASE) MCG/ACT IN AERS: 2.0000 | INHALATION_SPRAY | Freq: Four times a day (QID) | RESPIRATORY_TRACT | 0 refills | Status: DC | PRN

## 2022-12-25 NOTE — Progress Notes (Signed)
He is on a Zpack and prednisone and wheezing. Prevetil called in

## 2022-12-26 ENCOUNTER — Other Ambulatory Visit: Payer: Self-pay

## 2022-12-26 ENCOUNTER — Emergency Department (HOSPITAL_BASED_OUTPATIENT_CLINIC_OR_DEPARTMENT_OTHER)
Admission: EM | Admit: 2022-12-26 | Discharge: 2022-12-26 | Disposition: A | Discharge status: Home / Self Care | Payer: Medicare Other | Source: Home / Self Care | Attending: Emergency Medicine | Admitting: Emergency Medicine

## 2022-12-26 ENCOUNTER — Emergency Department (HOSPITAL_BASED_OUTPATIENT_CLINIC_OR_DEPARTMENT_OTHER): Payer: Medicare Other | Admitting: Radiology

## 2022-12-26 DIAGNOSIS — Z1152 Encounter for screening for COVID-19: Secondary | ICD-10-CM | POA: Insufficient documentation

## 2022-12-26 DIAGNOSIS — J121 Respiratory syncytial virus pneumonia: Secondary | ICD-10-CM | POA: Diagnosis not present

## 2022-12-26 DIAGNOSIS — R0902 Hypoxemia: Secondary | ICD-10-CM | POA: Insufficient documentation

## 2022-12-26 DIAGNOSIS — J210 Acute bronchiolitis due to respiratory syncytial virus: Secondary | ICD-10-CM | POA: Diagnosis not present

## 2022-12-26 DIAGNOSIS — B974 Respiratory syncytial virus as the cause of diseases classified elsewhere: Secondary | ICD-10-CM | POA: Insufficient documentation

## 2022-12-26 DIAGNOSIS — B338 Other specified viral diseases: Secondary | ICD-10-CM

## 2022-12-26 LAB — CBC WITH DIFFERENTIAL/PLATELET
Abs Immature Granulocytes: 0.07 10*3/uL (ref 0.00–0.07)
Basophils Absolute: 0 10*3/uL (ref 0.0–0.1)
Basophils Relative: 0 %
Eosinophils Absolute: 0 10*3/uL (ref 0.0–0.5)
Eosinophils Relative: 0 %
HCT: 42.7 % (ref 39.0–52.0)
Hemoglobin: 14.7 g/dL (ref 13.0–17.0)
Immature Granulocytes: 0 %
Lymphocytes Relative: 9 %
Lymphs Abs: 1.4 10*3/uL (ref 0.7–4.0)
MCH: 31 pg (ref 26.0–34.0)
MCHC: 34.4 g/dL (ref 30.0–36.0)
MCV: 90.1 fL (ref 80.0–100.0)
Monocytes Absolute: 1.1 10*3/uL — ABNORMAL HIGH (ref 0.1–1.0)
Monocytes Relative: 7 %
Neutro Abs: 13.1 10*3/uL — ABNORMAL HIGH (ref 1.7–7.7)
Neutrophils Relative %: 84 %
Platelets: 278 10*3/uL (ref 150–400)
RBC: 4.74 MIL/uL (ref 4.22–5.81)
RDW: 13.2 % (ref 11.5–15.5)
WBC: 15.6 10*3/uL — ABNORMAL HIGH (ref 4.0–10.5)
nRBC: 0 % (ref 0.0–0.2)

## 2022-12-26 LAB — COMPREHENSIVE METABOLIC PANEL
ALT: 24 U/L (ref 0–44)
AST: 30 U/L (ref 15–41)
Albumin: 4.6 g/dL (ref 3.5–5.0)
Alkaline Phosphatase: 59 U/L (ref 38–126)
Anion gap: 12 (ref 5–15)
BUN: 18 mg/dL (ref 8–23)
CO2: 25 mmol/L (ref 22–32)
Calcium: 9.7 mg/dL (ref 8.9–10.3)
Chloride: 95 mmol/L — ABNORMAL LOW (ref 98–111)
Creatinine, Ser: 0.94 mg/dL (ref 0.61–1.24)
GFR, Estimated: >60 mL/min (ref >60)
Glucose, Bld: 129 mg/dL — ABNORMAL HIGH (ref 70–99)
Potassium: 4.8 mmol/L (ref 3.5–5.1)
Sodium: 132 mmol/L — ABNORMAL LOW (ref 135–145)
Total Bilirubin: 0.3 mg/dL (ref 0.3–1.2)
Total Protein: 7.5 g/dL (ref 6.5–8.1)

## 2022-12-26 LAB — RESP PANEL BY RT-PCR (RSV, FLU A&B, COVID)  RVPGX2
Influenza A by PCR: NEGATIVE
Influenza B by PCR: NEGATIVE
Resp Syncytial Virus by PCR: POSITIVE — AB
SARS Coronavirus 2 by RT PCR: NEGATIVE

## 2022-12-26 MED ORDER — ALBUTEROL SULFATE (2.5 MG/3ML) 0.083% IN NEBU
2.5000 mg | INHALATION_SOLUTION | Freq: Once | RESPIRATORY_TRACT | Status: AC
  Administered 2022-12-26: 2.5 mg via RESPIRATORY_TRACT
  Filled 2022-12-26: qty 3

## 2022-12-26 MED ORDER — AEROCHAMBER PLUS FLO-VU LARGE MISC
1.0000 | Freq: Once | Status: AC
  Administered 2022-12-26: 1
  Filled 2022-12-26: qty 1

## 2022-12-26 NOTE — ED Triage Notes (Signed)
Patient arrives to ED POV c/o SHOB. Per Pt he had a virutal visit with his PCP who thinks pt my have Pneumonia and was instructed to come to the ER. Pt sat at 87% RA and was placed on 5L [place] in Triage By RT denies Hx of COPD,CHF or being on O2 at home.

## 2022-12-26 NOTE — ED Notes (Signed)
Low SpO2 reported to Triage Nurse

## 2022-12-26 NOTE — ED Provider Notes (Signed)
I provided a substantive portion of the care of this patient.  I personally performed the entirety of the exam for this encounter. {Remember to document shared critical care using "edcritical" dot phrase:1} EKG Interpretation  Date/Time:  Sunday December 26 2022 17:26:23 EST Ventricular Rate:  79 PR Interval:  156 QRS Duration: 101 QT Interval:  386 QTC Calculation: 443 R Axis:   4 Text Interpretation: Sinus rhythm Probable left atrial enlargement Borderline low voltage, extremity leads rate increased, otherwise no sig change from previous Confirmed by Charlesetta Shanks [phone]) on 12/26/2022 8:07:51 PM

## 2022-12-26 NOTE — Discharge Instructions (Addendum)
You are seen in the emergency department and diagnosed with hypoxia and RSV infection. You were given an MDI spacer to be used with your albuterol inhaler to make using the medication more effective. Please monitor your oxygen levels at home and return if you remain hypoxic without improvement using the albuterol inhaler. At this time, there is no evidence of pneumonia or other concerning pulmonary findings and your symptoms are likely due to the RSV infection.

## 2022-12-26 NOTE — ED Notes (Signed)
Patient transported to X-ray 

## 2022-12-26 NOTE — ED Provider Notes (Signed)
[facility] EMERGENCY DEPT Provider Note   CSN: [id number] Arrival date & time: 12/26/22  1622     History Chief Complaint  Patient presents with  . Shortness of Breath    Gregory [name] is a 82 y.o. male.   Shortness of Breath Associated symptoms: cough and wheezing   Associated symptoms: no chest pain, no fever and no vomiting   Patient presents emergency department complaints of shortness of breath.  Patient was advised by his primary care provider to come to the emergency department due to concerns of possible pneumonia.  Patient reports that he has had significant chest congestion with a cough, denies any fever, abdominal pain, nausea, vomiting.  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     Home Medications Prior to Admission medications   Medication Sig Start Date End Date Taking? Authorizing Provider  acetaminophen (TYLENOL) 650 MG CR tablet Take 1,300 mg by mouth 2 (two) times daily.    [provider]  albuterol (VENTOLIN HFA) 108 (90 Base) MCG/ACT inhaler Inhale 2 puffs into the lungs every 6 (six) hours as needed for wheezing or shortness of breath. 12/25/22   Denita Lung, MD  amoxicillin (AMOXIL) 500 MG capsule SMARTSIG:4 Capsule(s) By Mouth PRN for dental visits Patient not taking: Reported on 12/23/2022 09/06/22   Early, [name], NP  Ascorbic Acid (VITAMIN C) 1000 MG tablet 1 tablet    [provider]  azithromycin (ZITHROMAX) 250 MG tablet Take 2 tablets on day 1, then 1 tablet daily on days 2 through 5 12/23/22 12/28/22  Early, [name], NP  benzonatate (TESSALON) 200 MG capsule Take 1 capsule (200 mg total) by mouth 3 (three) times daily as needed for cough. 12/23/22   [name], NP  Cholecalciferol 125 MCG (5000 UT) capsule Take 5,000 Units by mouth daily.    [provider]   clidinium-chlordiazePOXIDE (LIBRAX) 5-2.5 MG capsule Take 1 capsule by mouth 3 (three) times daily before meals. 08/10/22   [name], NP  clorazepate (TRANXENE-T) 7.5 MG tablet Take 1 tablet (7.5 mg total) by mouth at bedtime as needed for anxiety. 09/06/22   [name], NP  COVID-19 mRNA vaccine [phone] (COMIRNATY) syringe Inject into the muscle. Patient not taking: Reported on 10/28/2022 10/06/22     docusate sodium (COLACE) 100 MG capsule Take 200 mg by mouth at bedtime.    [provider]  fluorometholone (FML) 0.1 % ophthalmic suspension SMARTSIG:1 Drop(s) In Eye(s) 3 Times Daily PRN 01/27/22   [provider]  fluorouracil (EFUDEX) 5 % cream daily 10/29/20   [provider]  gabapentin (NEURONTIN) 100 MG capsule Take 1 capsule (100 mg total) by mouth 2 (two) times daily. 09/06/22   [name], NP  HYDROcodone bit-homatropine (HYCODAN) 5-1.5 MG/5ML syrup Take 5 mLs by mouth every 8 (eight) hours as needed for cough. 12/23/22   [name], NP  Iron, Ferrous Sulfate, 325 (65 Fe) MG TABS Take 325 mg by mouth in the morning, at noon, and at bedtime. 08/23/21   Barb Merino, MD  levocetirizine (XYZAL) 5 MG tablet Take 1 tablet (5 mg total) by mouth every evening. 06/27/18   Janith Lima, MD  levothyroxine (SYNTHROID) 150 MCG tablet Take 1 tablet (150 mcg total) by mouth daily before breakfast. 05/06/22  [name], NP  magic mouthwash (nystatin, hydrocortisone, diphenhydrAMINE) suspension Swish and swallow 5 mLs 3 (three) times daily as needed (mouth and throat irritation). 12/23/22   [name], NP  Magnesium 250 MG TABS Take 250 mg by mouth at bedtime.     [provider]  meclizine (ANTIVERT) 25 MG tablet Take 1 tablet (25 mg total) by mouth 2 (two) times daily as needed for dizziness. 10/28/22   [name], NP  methocarbamol (ROBAXIN) 500 MG tablet Take 1 tablet (500 mg total) by mouth every 8 (eight) hours as needed for muscle spasms. 10/26/21    Janith Lima, MD  montelukast (SINGULAIR) 10 MG tablet TAKE 1 TABLET BY MOUTH AT  BEDTIME 07/02/22   Early, [name], NP  Multiple Vitamin (MULTIVITAMIN WITH MINERALS) TABS tablet Take 1 tablet by mouth every morning.    [provider]  pantoprazole (PROTONIX) 40 MG tablet TAKE 1 TABLET BY MOUTH TWICE  DAILY 07/02/22   Early, [name], NP  silodosin (RAPAFLO) 8 MG CAPS capsule Take 1 capsule (8 mg total) by mouth at bedtime. TAKE ONE CAPSULE BY MOUTH DAILY AT BEDTIME 06/11/22   Early, [name], NP  valsartan (DIOVAN) 40 MG tablet TAKE 1 TABLET BY MOUTH EVERY DAY 08/30/22   Early, [name], NP  Zoster Vaccine Adjuvanted [facility]) injection Inject into the muscle. Patient not taking: Reported on 12/23/2022 10/25/22   Carlyle Basques, MD      Allergies    Nsaids, Other, Aspirin, Statins, Demeclocycline, Dust mite extract, and Tizanidine hcl    Review of Systems   Review of Systems  Constitutional:  Negative for chills and fever.  HENT:  Negative for sinus pressure.   Respiratory:  Positive for cough, shortness of breath and wheezing. Negative for chest tightness.   Cardiovascular:  Negative for chest pain.  Gastrointestinal:  Negative for diarrhea, nausea and vomiting.  All other systems reviewed and are negative.   Physical Exam Updated Vital Signs BP (!) 138/58 (BP Location: Right Arm)   Pulse 72   Temp 97.9 F (36.6 C) (Oral)   Resp 16   Ht '5\' 10"'$  (1.778 m)   Wt 81.2 kg   SpO2 98%   BMI 25.68 kg/m  Physical Exam Vitals and nursing note reviewed.  HENT:     Head: Normocephalic and atraumatic.     Mouth/Throat:     Mouth: Mucous membranes are moist.  Cardiovascular:     Rate and Rhythm: Normal rate and regular rhythm.  Pulmonary:     Effort: Pulmonary effort is normal. No tachypnea, bradypnea or respiratory distress.     Breath sounds: No stridor. Examination of the right-upper field reveals wheezing. Examination of the left-upper field reveals wheezing. Examination of the  right-middle field reveals rales. Examination of the left-middle field reveals rales. Wheezing and rales present. No decreased breath sounds.  Chest:     Chest wall: No mass, tenderness or edema.  Skin:    General: Skin is warm.     Capillary Refill: Capillary refill takes less than 2 seconds.  Neurological:     General: No focal deficit present.     Mental Status: He is alert.    ED Results / Procedures / Treatments   Labs (all labs ordered are listed, but only abnormal results are displayed) Labs Reviewed  RESP PANEL BY RT-PCR (RSV, FLU A&B, COVID)  RVPGX2 - Abnormal; Notable for the following components:      Result Value  Resp Syncytial Virus by PCR POSITIVE (*)    All other components within normal limits  CBC WITH DIFFERENTIAL/PLATELET - Abnormal; Notable for the following components:   WBC 15.6 (*)    Neutro Abs 13.1 (*)    Monocytes Absolute 1.1 (*)    All other components within normal limits  COMPREHENSIVE METABOLIC PANEL - Abnormal; Notable for the following components:   Sodium 132 (*)    Chloride 95 (*)    Glucose, Bld 129 (*)    All other components within normal limits    EKG None  Radiology DG Chest 2 View  Result Date: 12/26/2022 CLINICAL DATA:  Shortness of breath EXAM: CHEST - 2 VIEW COMPARISON:  Chest x-ray 12/07/2020.  CT chest 08/17/2021. 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 There is no new lung infiltrate, pleural effusion or pneumothorax. The cardiomediastinal silhouette is within normal limits. No acute fractures are identified. IMPRESSION: No active cardiopulmonary disease. Electronically Signed   By: Ronney Asters M.D.   On: 12/26/2022 18:24    Procedures Procedures   Medications Ordered in ED Medications  albuterol (PROVENTIL) (2.5 MG/3ML) 0.083% nebulizer solution 2.5 mg (2.5 mg Nebulization Given 12/26/22 1723)    ED Course/ Medical Decision Making/ A&P Clinical Course as of 12/26/22 2139  Nancy Fetter Dec 26, 2022   2009 Pulse Rate: 81 [OZ]    Clinical Course User Index [OZ] Luvenia Heller, PA-C                           Medical Decision Making Amount and/or Complexity of Data Reviewed Labs: ordered. Radiology: ordered.  Risk Prescription drug management.   This patient presents to the ED for concern of shortness of breath.  Differential diagnosis includes ***    Additional history obtained:  Additional history obtained from *** External records from outside source obtained and reviewed including ***   Lab Tests:  I Ordered, and personally interpreted labs.  The pertinent results include:  ***   Imaging Studies ordered:  I ordered imaging studies including ***  I independently visualized and interpreted imaging which showed *** I agree with the radiologist interpretation   Medicines ordered and prescription drug management:  I ordered medication including ***  for ***  Reevaluation of the patient after these medicines showed that the patient {resolved/improved/worsened:23923::"improved"} I have reviewed the patients home medicines and have made adjustments as needed   Problem List / ED Course:  ***   Social Determinants of Health:    Final Clinical Impression(s) / ED Diagnoses Final diagnoses:  None    Rx / DC Orders ED Discharge Orders     None

## 2022-12-26 NOTE — ED Notes (Signed)
RT educated pt on proper use of MDI w/spacer. Pt able to perform w/out difficulty. Pt verbalizes understanding of teaching.

## 2022-12-26 NOTE — ED Provider Triage Note (Signed)
Emergency Medicine Provider Triage Evaluation Note  Gregory [name] , a 82 y.o. male  was evaluated in triage.  Pt complains of shortness of breath. Patient reports that he first noticed a sore throat about 1 week prior and has since developed increasing shortness of breath with deep inhalation. His wife mentions that she can hear a rattle in his chest. Patient denies fever, nausea, abdominal pain, diarrhea, or chest pain.  Review of Systems  Positive: As above Negative: As above  Physical Exam  BP (!) 162/69 (BP Location: Right Arm)   Pulse 88   Temp 97.8 F (36.6 C)   Resp 18   SpO2 (!) 86%  Gen:   Awake, no distress Resp:  Normal effort, diffuse mild wheezing and crackles heard, possible pleural friction rub MSK:   Moves extremities without difficulty  Other:    Medical Decision Making  Medically screening exam initiated at 5:10 PM.  Appropriate orders placed.  Gregory [name] was informed that the remainder of the evaluation will be completed by another provider, this initial triage assessment does not replace that evaluation, and the importance of remaining in the ED until their evaluation is complete.     Luvenia Heller, PA-C 12/26/22 1712

## 2022-12-27 ENCOUNTER — Encounter (HOSPITAL_COMMUNITY): Payer: Self-pay | Admitting: Internal Medicine

## 2022-12-27 ENCOUNTER — Telehealth: Payer: Self-pay | Admitting: Nurse Practitioner

## 2022-12-27 ENCOUNTER — Encounter (HOSPITAL_COMMUNITY): Payer: Self-pay

## 2022-12-27 ENCOUNTER — Emergency Department (HOSPITAL_BASED_OUTPATIENT_CLINIC_OR_DEPARTMENT_OTHER): Payer: Medicare Other

## 2022-12-27 ENCOUNTER — Inpatient Hospital Stay (HOSPITAL_BASED_OUTPATIENT_CLINIC_OR_DEPARTMENT_OTHER)
Admission: EM | Admit: 2022-12-27 | Discharge: 2023-01-04 | DRG: 193 | Disposition: A | Discharge status: Home / Self Care | Payer: Medicare Other | Attending: Internal Medicine | Admitting: Internal Medicine

## 2022-12-27 DIAGNOSIS — K219 Gastro-esophageal reflux disease without esophagitis: Secondary | ICD-10-CM | POA: Diagnosis present

## 2022-12-27 DIAGNOSIS — E871 Hypo-osmolality and hyponatremia: Secondary | ICD-10-CM | POA: Diagnosis present

## 2022-12-27 DIAGNOSIS — Z833 Family history of diabetes mellitus: Secondary | ICD-10-CM

## 2022-12-27 DIAGNOSIS — N401 Enlarged prostate with lower urinary tract symptoms: Secondary | ICD-10-CM | POA: Diagnosis present

## 2022-12-27 DIAGNOSIS — Z841 Family history of disorders of kidney and ureter: Secondary | ICD-10-CM

## 2022-12-27 DIAGNOSIS — I480 Paroxysmal atrial fibrillation: Secondary | ICD-10-CM | POA: Diagnosis present

## 2022-12-27 DIAGNOSIS — I10 Essential (primary) hypertension: Secondary | ICD-10-CM

## 2022-12-27 DIAGNOSIS — J9601 Acute respiratory failure with hypoxia: Secondary | ICD-10-CM

## 2022-12-27 DIAGNOSIS — I780 Hereditary hemorrhagic telangiectasia: Secondary | ICD-10-CM | POA: Diagnosis present

## 2022-12-27 DIAGNOSIS — Z1152 Encounter for screening for COVID-19: Secondary | ICD-10-CM | POA: Diagnosis not present

## 2022-12-27 DIAGNOSIS — B338 Other specified viral diseases: Secondary | ICD-10-CM | POA: Diagnosis present

## 2022-12-27 DIAGNOSIS — Z823 Family history of stroke: Secondary | ICD-10-CM

## 2022-12-27 DIAGNOSIS — Z886 Allergy status to analgesic agent status: Secondary | ICD-10-CM

## 2022-12-27 DIAGNOSIS — N138 Other obstructive and reflux uropathy: Secondary | ICD-10-CM | POA: Diagnosis present

## 2022-12-27 DIAGNOSIS — Z85828 Personal history of other malignant neoplasm of skin: Secondary | ICD-10-CM | POA: Diagnosis not present

## 2022-12-27 DIAGNOSIS — E039 Hypothyroidism, unspecified: Secondary | ICD-10-CM | POA: Diagnosis not present

## 2022-12-27 DIAGNOSIS — F419 Anxiety disorder, unspecified: Secondary | ICD-10-CM | POA: Diagnosis present

## 2022-12-27 DIAGNOSIS — Z79899 Other long term (current) drug therapy: Secondary | ICD-10-CM

## 2022-12-27 DIAGNOSIS — Z888 Allergy status to other drugs, medicaments and biological substances status: Secondary | ICD-10-CM | POA: Diagnosis not present

## 2022-12-27 DIAGNOSIS — Z800 Family history of malignant neoplasm of digestive organs: Secondary | ICD-10-CM

## 2022-12-27 DIAGNOSIS — E7800 Pure hypercholesterolemia, unspecified: Secondary | ICD-10-CM | POA: Diagnosis not present

## 2022-12-27 DIAGNOSIS — J121 Respiratory syncytial virus pneumonia: Principal | ICD-10-CM | POA: Diagnosis present

## 2022-12-27 DIAGNOSIS — J9621 Acute and chronic respiratory failure with hypoxia: Secondary | ICD-10-CM | POA: Diagnosis present

## 2022-12-27 DIAGNOSIS — I4892 Unspecified atrial flutter: Secondary | ICD-10-CM | POA: Diagnosis not present

## 2022-12-27 DIAGNOSIS — Q2572 Congenital pulmonary arteriovenous malformation: Secondary | ICD-10-CM | POA: Diagnosis not present

## 2022-12-27 DIAGNOSIS — Z91048 Other nonmedicinal substance allergy status: Secondary | ICD-10-CM | POA: Diagnosis not present

## 2022-12-27 DIAGNOSIS — J210 Acute bronchiolitis due to respiratory syncytial virus: Secondary | ICD-10-CM | POA: Diagnosis present

## 2022-12-27 DIAGNOSIS — Z9981 Dependence on supplemental oxygen: Secondary | ICD-10-CM

## 2022-12-27 DIAGNOSIS — Z79890 Hormone replacement therapy (postmenopausal): Secondary | ICD-10-CM

## 2022-12-27 DIAGNOSIS — E785 Hyperlipidemia, unspecified: Secondary | ICD-10-CM | POA: Diagnosis present

## 2022-12-27 DIAGNOSIS — Z8249 Family history of ischemic heart disease and other diseases of the circulatory system: Secondary | ICD-10-CM

## 2022-12-27 HISTORY — DX: Interstitial pulmonary disease, unspecified: J84.9

## 2022-12-27 HISTORY — DX: Essential (primary) hypertension: I10

## 2022-12-27 MED ORDER — PANTOPRAZOLE SODIUM 40 MG PO TBEC
40.0000 mg | DELAYED_RELEASE_TABLET | Freq: Every day | ORAL | Status: DC
  Administered 2022-12-28 – 2023-01-04 (×8): 40 mg via ORAL
  Filled 2022-12-27 (×8): qty 1

## 2022-12-27 MED ORDER — MONTELUKAST SODIUM 10 MG PO TABS
10.0000 mg | ORAL_TABLET | Freq: Every day | ORAL | Status: DC
  Administered 2022-12-27 – 2023-01-03 (×8): 10 mg via ORAL
  Filled 2022-12-27 (×8): qty 1

## 2022-12-27 MED ORDER — GABAPENTIN 100 MG PO CAPS
100.0000 mg | ORAL_CAPSULE | Freq: Two times a day (BID) | ORAL | Status: DC
  Administered 2022-12-27 – 2023-01-04 (×16): 100 mg via ORAL
  Filled 2022-12-27 (×16): qty 1

## 2022-12-27 MED ORDER — LEVOTHYROXINE SODIUM 75 MCG PO TABS
150.0000 ug | ORAL_TABLET | Freq: Every day | ORAL | Status: DC
  Administered 2022-12-28 – 2023-01-04 (×8): 150 ug via ORAL
  Filled 2022-12-27 (×8): qty 2

## 2022-12-27 MED ORDER — METHYLPREDNISOLONE SODIUM SUCC 125 MG IJ SOLR
125.0000 mg | Freq: Once | INTRAMUSCULAR | Status: AC
  Administered 2022-12-27: 125 mg via INTRAVENOUS
  Filled 2022-12-27: qty 2

## 2022-12-27 MED ORDER — LORATADINE 10 MG PO TABS
10.0000 mg | ORAL_TABLET | Freq: Every evening | ORAL | Status: DC
  Administered 2022-12-27 – 2023-01-03 (×8): 10 mg via ORAL
  Filled 2022-12-27 (×8): qty 1

## 2022-12-27 MED ORDER — ONDANSETRON HCL 4 MG PO TABS: 4.0000 mg | ORAL_TABLET | Freq: Four times a day (QID) | ORAL | Status: DC | PRN

## 2022-12-27 MED ORDER — ACETAMINOPHEN 325 MG PO TABS
650.0000 mg | ORAL_TABLET | Freq: Four times a day (QID) | ORAL | Status: DC | PRN
  Administered 2022-12-30 – 2023-01-03 (×8): 650 mg via ORAL
  Filled 2022-12-27 (×8): qty 2

## 2022-12-27 MED ORDER — POLYETHYLENE GLYCOL 3350 17 G PO PACK: 17.0000 g | PACK | Freq: Every day | ORAL | Status: DC | PRN

## 2022-12-27 MED ORDER — ACETAMINOPHEN 650 MG RE SUPP: 650.0000 mg | Freq: Four times a day (QID) | RECTAL | Status: DC | PRN

## 2022-12-27 MED ORDER — ONDANSETRON HCL 4 MG/2ML IJ SOLN: 4.0000 mg | Freq: Four times a day (QID) | INTRAMUSCULAR | Status: DC | PRN

## 2022-12-27 MED ORDER — METHOCARBAMOL 500 MG PO TABS: 500.0000 mg | ORAL_TABLET | Freq: Three times a day (TID) | ORAL | Status: DC | PRN

## 2022-12-27 MED ORDER — BISACODYL 5 MG PO TBEC: 5.0000 mg | DELAYED_RELEASE_TABLET | Freq: Every day | ORAL | Status: DC | PRN

## 2022-12-27 MED ORDER — ALBUTEROL SULFATE (2.5 MG/3ML) 0.083% IN NEBU
2.5000 mg | INHALATION_SOLUTION | RESPIRATORY_TRACT | Status: DC | PRN
  Administered 2022-12-27: 2.5 mg via RESPIRATORY_TRACT
  Filled 2022-12-27: qty 3

## 2022-12-27 MED ORDER — TAMSULOSIN HCL 0.4 MG PO CAPS
0.4000 mg | ORAL_CAPSULE | Freq: Every day | ORAL | Status: DC
  Administered 2022-12-27 – 2023-01-03 (×8): 0.4 mg via ORAL
  Filled 2022-12-27 (×8): qty 1

## 2022-12-27 MED ORDER — FLUOROMETHOLONE 0.1 % OP SUSP: 1.0000 [drp] | Freq: Three times a day (TID) | OPHTHALMIC | Status: DC | PRN

## 2022-12-27 MED ORDER — HYDRALAZINE HCL 20 MG/ML IJ SOLN: 5.0000 mg | INTRAMUSCULAR | Status: DC | PRN

## 2022-12-27 MED ORDER — LACTATED RINGERS IV SOLN: INTRAVENOUS | Status: AC

## 2022-12-27 MED ORDER — IRBESARTAN 75 MG PO TABS
37.5000 mg | ORAL_TABLET | Freq: Every day | ORAL | Status: DC
  Administered 2022-12-28 – 2023-01-04 (×8): 37.5 mg via ORAL
  Filled 2022-12-27 (×9): qty 0.5

## 2022-12-27 MED ORDER — IPRATROPIUM-ALBUTEROL 0.5-2.5 (3) MG/3ML IN SOLN
3.0000 mL | Freq: Once | RESPIRATORY_TRACT | Status: AC
  Administered 2022-12-27: 3 mL via RESPIRATORY_TRACT
  Filled 2022-12-27: qty 3

## 2022-12-27 MED ORDER — DOCUSATE SODIUM 100 MG PO CAPS
100.0000 mg | ORAL_CAPSULE | Freq: Two times a day (BID) | ORAL | Status: DC
  Administered 2022-12-27 – 2023-01-04 (×16): 100 mg via ORAL
  Filled 2022-12-27 (×16): qty 1

## 2022-12-27 MED ORDER — SODIUM CHLORIDE 0.9% FLUSH
3.0000 mL | Freq: Two times a day (BID) | INTRAVENOUS | Status: DC
  Administered 2022-12-28 – 2023-01-04 (×14): 3 mL via INTRAVENOUS

## 2022-12-27 MED ORDER — BENZONATATE 100 MG PO CAPS
200.0000 mg | ORAL_CAPSULE | Freq: Three times a day (TID) | ORAL | Status: DC | PRN
  Administered 2022-12-28: 200 mg via ORAL
  Filled 2022-12-27: qty 2

## 2022-12-27 MED ORDER — CLORAZEPATE DIPOTASSIUM 3.75 MG PO TABS
7.5000 mg | ORAL_TABLET | Freq: Every evening | ORAL | Status: DC | PRN
  Administered 2022-12-28: 7.5 mg via ORAL
  Filled 2022-12-27 (×2): qty 2

## 2022-12-27 MED ORDER — HYDROCODONE BIT-HOMATROP MBR 5-1.5 MG/5ML PO SOLN
5.0000 mL | Freq: Three times a day (TID) | ORAL | Status: DC | PRN
  Administered 2022-12-28: 5 mL via ORAL
  Filled 2022-12-27 (×2): qty 5

## 2022-12-27 MED ORDER — ALBUTEROL SULFATE (2.5 MG/3ML) 0.083% IN NEBU
2.5000 mg | INHALATION_SOLUTION | Freq: Once | RESPIRATORY_TRACT | Status: AC
  Administered 2022-12-27: 2.5 mg via RESPIRATORY_TRACT
  Filled 2022-12-27: qty 3

## 2022-12-27 NOTE — ED Notes (Addendum)
Report was attempted to the floor. Floor aware that [facility] was enroute to [facility] and aware they would be enroute in a few mins. They were still unavailable to take report.

## 2022-12-27 NOTE — Plan of Care (Signed)
   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 RA sats today 81%. was taking his wife's prednisone at home for 4 days(40 mg daily). given IV solumedrol. CXR negative for pneumonia Going to: [facility] Admission Status: observation Bed Type: progressive To Do:  [facility] will assume care on arrival to accepting facility. Until arrival, medical decision making responsibilities remain with the EDP.  However, [facility] available 24/7 for questions and assistance.   Nursing staff please page [facility] and Consults [phone]) as soon as the patient arrives to the hospital.  Kristopher Oppenheim, DO [facility]

## 2022-12-27 NOTE — ED Notes (Signed)
Report given to [facility]. 

## 2022-12-27 NOTE — ED Triage Notes (Signed)
Pt seen earlier in ED and was D/C. Pt back due to O2 sat continuing to drop.

## 2022-12-27 NOTE — ED Notes (Signed)
Report given to the RN on the Floor.

## 2022-12-27 NOTE — Telephone Encounter (Signed)
Ann called wanted to let you know Gregory [name] has tested positive to RSV and he is admitted in the hospital until further notice.

## 2022-12-27 NOTE — ED Provider Notes (Signed)
[place] EMERGENCY DEPT Provider Note   CSN: [id number] Arrival date & time: 12/27/22  0209     History  Chief Complaint  Patient presents with   Shortness of Breath    Gregory [name] is a 82 y.o. male.  HPI   Patient with medical history including Osler Weber Rendu, interstitial lung disease, hypertension, proximal A-fib not on anticoag's presents to the emergency department with complaints of shortness of breath.  Patient states about a week ago he was having cough congestion, states he has a slight productive cough, no associated fevers chills stomach pains general body aches.  He denies any chest pain or pleuritic chest pain, he states that he was checking his pulse ox at home and notes that it was low so he came in for further evaluation.  he was seen here earlier and was told he has RSV and was discharged, he states that upon getting home his O2 sats kept Getting lower and lower.  He states that he has been taking decongestions without much success, he also has been taking his wife's prednisone for the last 4 days he states he been 20 mg 2 times daily.  I reviewed patient's chart chest x-ray which was obtained yesterday was unremarkable, respiratory panel positive for RSV, he had slight leukocytosis of 15,000, CMP showing slight decrease in sodium 132 chloride 95 glucose 129.  Home Medications Prior to Admission medications   Medication Sig Start Date End Date Taking? Authorizing Provider  acetaminophen (TYLENOL) 650 MG CR tablet Take 1,300 mg by mouth 2 (two) times daily.    [provider]  albuterol (VENTOLIN HFA) 108 (90 Base) MCG/ACT inhaler Inhale 2 puffs into the lungs every 6 (six) hours as needed for wheezing or shortness of breath. 12/25/22   Denita Lung, MD  amoxicillin (AMOXIL) 500 MG capsule SMARTSIG:4 Capsule(s) By Mouth PRN for dental visits Patient not taking: Reported on 12/23/2022 09/06/22   Early, [name], NP  Ascorbic Acid (VITAMIN C)  1000 MG tablet 1 tablet    [provider]  azithromycin (ZITHROMAX) 250 MG tablet Take 2 tablets on day 1, then 1 tablet daily on days 2 through 5 12/23/22 12/28/22  Early, [name], NP  benzonatate (TESSALON) 200 MG capsule Take 1 capsule (200 mg total) by mouth 3 (three) times daily as needed for cough. 12/23/22   [name], NP  Cholecalciferol 125 MCG (5000 UT) capsule Take 5,000 Units by mouth daily.    [provider]  clidinium-chlordiazePOXIDE (LIBRAX) 5-2.5 MG capsule Take 1 capsule by mouth 3 (three) times daily before meals. 08/10/22   [name], NP  clorazepate (TRANXENE-T) 7.5 MG tablet Take 1 tablet (7.5 mg total) by mouth at bedtime as needed for anxiety. 09/06/22   [name], NP  COVID-19 mRNA vaccine [phone] (COMIRNATY) syringe Inject into the muscle. Patient not taking: Reported on 10/28/2022 10/06/22     docusate sodium (COLACE) 100 MG capsule Take 200 mg by mouth at bedtime.    [provider]  fluorometholone (FML) 0.1 % ophthalmic suspension SMARTSIG:1 Drop(s) In Eye(s) 3 Times Daily PRN 01/27/22   [provider]  fluorouracil (EFUDEX) 5 % cream daily 10/29/20   [provider]  gabapentin (NEURONTIN) 100 MG capsule Take 1 capsule (100 mg total) by mouth 2 (two) times daily. 09/06/22   [name], NP  HYDROcodone bit-homatropine (HYCODAN) 5-1.5 MG/5ML syrup Take 5 mLs by mouth every 8 (eight) hours as needed  for cough. 12/23/22   [name], NP  Iron, Ferrous Sulfate, 325 (65 Fe) MG TABS Take 325 mg by mouth in the morning, at noon, and at bedtime. 08/23/21   Barb Merino, MD  levocetirizine (XYZAL) 5 MG tablet Take 1 tablet (5 mg total) by mouth every evening. 06/27/18   Janith Lima, MD  levothyroxine (SYNTHROID) 150 MCG tablet Take 1 tablet (150 mcg total) by mouth daily before breakfast. 05/06/22   Early, [name], NP  magic mouthwash (nystatin, hydrocortisone, diphenhydrAMINE) suspension Swish and swallow 5 mLs 3 (three) times  daily as needed (mouth and throat irritation). 12/23/22   [name], NP  Magnesium 250 MG TABS Take 250 mg by mouth at bedtime.     [provider]  meclizine (ANTIVERT) 25 MG tablet Take 1 tablet (25 mg total) by mouth 2 (two) times daily as needed for dizziness. 10/28/22   [name], NP  methocarbamol (ROBAXIN) 500 MG tablet Take 1 tablet (500 mg total) by mouth every 8 (eight) hours as needed for muscle spasms. 10/26/21   Janith Lima, MD  montelukast (SINGULAIR) 10 MG tablet TAKE 1 TABLET BY MOUTH AT  BEDTIME 07/02/22   Early, [name], NP  Multiple Vitamin (MULTIVITAMIN WITH MINERALS) TABS tablet Take 1 tablet by mouth every morning.    [provider]  pantoprazole (PROTONIX) 40 MG tablet TAKE 1 TABLET BY MOUTH TWICE  DAILY 07/02/22   Early, [name], NP  silodosin (RAPAFLO) 8 MG CAPS capsule Take 1 capsule (8 mg total) by mouth at bedtime. TAKE ONE CAPSULE BY MOUTH DAILY AT BEDTIME 06/11/22   Early, [name], NP  valsartan (DIOVAN) 40 MG tablet TAKE 1 TABLET BY MOUTH EVERY DAY 08/30/22   Early, [name], NP  Zoster Vaccine Adjuvanted [facility]) injection Inject into the muscle. Patient not taking: Reported on 12/23/2022 10/25/22   Carlyle Basques, MD      Allergies    Nsaids, Other, Aspirin, Statins, Demeclocycline, Dust mite extract, and Tizanidine hcl    Review of Systems   Review of Systems  Constitutional:  Negative for chills and fever.  Respiratory:  Positive for cough and shortness of breath.   Cardiovascular:  Negative for chest pain.  Gastrointestinal:  Negative for abdominal pain.  Neurological:  Negative for headaches.    Physical Exam Updated Vital Signs BP (!) 125/55 (BP Location: Right Arm)   Pulse (!) 101   Temp 98 F (36.7 C) (Oral)   Resp 19   SpO2 92%  Physical Exam Vitals and nursing note reviewed.  Constitutional:      General: He is not in acute distress.    Appearance: He is not ill-appearing.  HENT:     Head: Normocephalic and atraumatic.      Nose: No congestion.     Mouth/Throat:     Mouth: Mucous membranes are dry.     Pharynx: Oropharynx is clear.  Eyes:     Conjunctiva/sclera: Conjunctivae normal.  Cardiovascular:     Rate and Rhythm: Normal rate and regular rhythm.     Pulses: Normal pulses.     Heart sounds: No murmur heard.    No friction rub. No gallop.  Pulmonary:     Effort: No respiratory distress.     Breath sounds: No wheezing, rhonchi or rales.     Comments: On arrival patient was hypoxic, in the low 80s while on room air, placed on nonrebreather as patient has an occluded left  nostril, he is currently on 8 L, O2 sats in the mid to low 90s.  He is nontachypneic, able to speak in full sentences, there is slight wheezing with rhonchi clears with coughing no Rales present. Musculoskeletal:     Right lower leg: No edema.     Left lower leg: No edema.     Comments: No unilateral leg swelling no calf tenderness no palpable cords  Skin:    General: Skin is warm and dry.  Neurological:     Mental Status: He is alert.  Psychiatric:        Mood and Affect: Mood normal.     ED Results / Procedures / Treatments   Labs (all labs ordered are listed, but only abnormal results are displayed) Labs Reviewed - No data to display  EKG None  Radiology DG Chest [facility] 1 View  Result Date: 12/27/2022 CLINICAL DATA:  Shortness of breath.  RSV EXAM: PORTABLE CHEST 1 VIEW COMPARISON:  Yesterday FINDINGS: Normal heart size and stable mediastinal contours. Stable interstitial coarsening. 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 No effusion or air bronchogram. IMPRESSION: Stable from prior.  No focal pneumonia. Electronically Signed   By: Jorje Guild M.D.   On: 12/27/2022 04:07   DG Chest 2 View  Result Date: 12/26/2022 CLINICAL DATA:  Shortness of breath EXAM: CHEST - 2 VIEW COMPARISON:  Chest x-ray 12/07/2020.  CT chest 08/17/2021. 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 There is no new lung infiltrate, pleural effusion or pneumothorax. The cardiomediastinal silhouette is within normal limits. No acute fractures are identified. IMPRESSION: No active cardiopulmonary disease. Electronically Signed   By: Ronney Asters M.D.   On: 12/26/2022 18:24    Procedures .Critical Care  Performed by: Marcello Fennel, PA-C Authorized by: Marcello Fennel, PA-C   Critical care provider statement:    Critical care time (minutes):  30   Critical care time was exclusive of:  Separately billable procedures and treating other patients   Critical care was necessary to treat or prevent imminent or life-threatening deterioration of the following conditions:  Respiratory failure   Critical care was time spent personally by me on the following activities:  Development of treatment plan with patient or surrogate, discussions with consultants, evaluation of patient's response to treatment, examination of patient, ordering and review of laboratory studies, ordering and review of radiographic studies, ordering and performing treatments and interventions, pulse oximetry, re-evaluation of patient's condition and review of old charts   I assumed direction of critical care for this patient from another provider in my specialty: no     Care discussed with: admitting provider       Medications Ordered in ED Medications  ipratropium-albuterol (DUONEB) 0.5-2.5 (3) MG/3ML nebulizer solution 3 mL (3 mLs Nebulization Given 12/27/22 0315)  albuterol (PROVENTIL) (2.5 MG/3ML) 0.083% nebulizer solution 2.5 mg (2.5 mg Nebulization Given 12/27/22 0315)  methylPREDNISolone sodium succinate (SOLU-MEDROL) 125 mg/2 mL injection 125 mg (125 mg Intravenous Given 12/27/22 0401)    ED Course/ Medical Decision Making/ A&P                           Medical Decision Making Amount and/or Complexity of Data Reviewed Radiology: ordered.  Risk Prescription drug management. Decision regarding  hospitalization.   This patient presents to the ED for concern of SOB, this involves an extensive number of treatment options, and is a  complaint that carries with it a high risk of complications and morbidity.  The differential diagnosis includes ACS, PE, CHF, pneumonia    Additional history obtained:  Additional history obtained from wife at bedside External records from outside source obtained and reviewed including recent ER notes   Co morbidities that complicate the patient evaluation  Interstitial lung disease  Social Determinants of Health:  N/A    Lab Tests:  I Ordered, and personally interpreted labs.  The pertinent results include: N/A   Imaging Studies ordered:  I ordered imaging studies including chest x-ray I independently visualized and interpreted imaging which showed negative acute findings I agree with the radiologist interpretation   Cardiac Monitoring:  The patient was maintained on a cardiac monitor.  I personally viewed and interpreted the cardiac monitored which showed an underlying rhythm of: N/A   Medicines ordered and prescription drug management:  I ordered medication including Solu-Medrol, bronchodilators I have reviewed the patients home medicines and have made adjustments as needed  Critical Interventions:  Presents hypoxic on room air, will place on supplemental O2  Currently simple mask at 8 L resting comfortably will continue to monitor Able to wean down slightly on 7 L without acute distress   Reevaluation:  Patient was given steroids as well as bronchodilators, reassessment lung sounds have improved, wheezing has since resolved, still rhonchi present but clears with coughing, he is resting comfortably, I recommend admission for acute hypoxia he is agreement this plan will consult hospitalist team     Consultations Obtained:  I requested consultation with the hospitalist Dr. Bridgett Larsson,  and discussed lab and imaging findings as  well as pertinent plan - they recommend: He will admit the patient    Test Considered:  BiPAP-this was considered but I do not feel patient needs BiPAP at this time, he is nontachypneic, he has no work of breathing at this time, he is maintaining adequate oxygenation on a simple mask at 7 L.    Rule out I doubt ACS denies any chest pain, EKG was without signs of ischemia.  Low suspicion for PE as patient denies pleuritic chest pain, patient denies leg pain, no pedal edema noted on exam, patient does have new oxygen requirement but this is most likely explained by RSV presentation is most consistent with this diagnosis.  I doubt CHF exacerbation he is does not appear to be volume overloaded by exam there is no crackles present during my lung exam, on most recent imaging there is no evidence of pleural effusions.  Will defer on antibiotics at this time as he is afebrile, nontachycardic, he does have an elevated leukocytosis but this is likely explained by the recent prednisone use that he has been taking for the last 4 days.   Dispostion and problem list  After consideration of the diagnostic results and the patients response to treatment, I feel that the patent would benefit from admission.  Acute hypoxia-secondary to RSV, patient will likely benefit from continued steroids, bronchodilators, and weaning down on supplemental oxygenation, he will need further management by hospitalist team.            Final Clinical Impression(s) / ED Diagnoses Final diagnoses:  Acute respiratory failure with hypoxia ([place])  RSV (acute bronchiolitis due to respiratory syncytial virus)    Rx / DC Orders ED Discharge Orders     None         Marcello Fennel, PA-C 12/27/22 0457    Merryl Hacker, MD 12/28/22 [phone]

## 2022-12-27 NOTE — H&P (Signed)
History and Physical    Patient: Gregory [name] GUY:[id number] DOB: 11-26-1941 DOA: 12/27/2022 DOS: the patient was seen and examined on 12/27/2022 PCP: [name], NP  Patient coming from: Home - lives with wife; NOK: Wife, [phone]   Chief Complaint: SOB  HPI: Gregory [name] is a 82 y.o. male with medical history significant of BPH, HLD, hereditary telangiectasias, hypothyroidism, aflutter, and ILD presenting with SOB. He was seen in the ER on 1/7 for SOB and was found to have O2 sats in upper 80s in triage but this improved and he was discharged.  They went to [place] yesterday and got home about 11pm and he couldn't stop coughing so they came back.  O2 sats down to 85%.  Usually without illness it is 90-93% without illness.  Symptoms started about 10 days ago.  He was visiting a friend in the hospital.  He developed sore throat -> headache.  By Thursday/Friday it was bad.  They did a virtual visit, concern for walking PNA and taking abx.  They called his PCP and asked for an inhaler.  Friday he was coughing so much that they was congestion and he couldn't catch his breath.  He is not usually on O2 and his lungs are generally ok.  He has frequent nosebleeds, can't wear [place] O2.    ER Course:  [facility] transfer, per Dr. Bridgett Larsson:  [name] with hx of ILD(not on home O2), osler-weber-rendu syndrome, x 1 week of SOB, cough. RA sats today 81%. was taking his wife's prednisone at home for 4 days(40 mg daily). given IV solumedrol. CXR negative for pneumonia      Review of Systems: As mentioned in the history of present illness. All other systems reviewed and are negative. 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    Symptomatic anemia 08/22/2021   Telangiectasia, hereditary hemorrhagic, of Rendu, 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)   SKIN, LIPS, NASAL W/ PREVIOUS NOSE BLEEDS  AMD GI TELANGIECTASIA   Unspecified hypothyroidism    Past Surgical History:  Procedure Laterality Date   APPLICATION OF CRANIAL NAVIGATION N/A 05/16/2015   Procedure: APPLICATION OF CRANIAL NAVIGATION;  Surgeon: Consuella Lose, MD;  Location: [facility] NEURO ORS;  Service: Neurosurgery;  Laterality: N/A;   BRAIN BIOPSY Left 05/16/2015   Procedure: Stereotactic Left Brain Biopsy with Brain Lab;  Surgeon: Consuella Lose, MD;  Location: [facility] NEURO ORS;  Service: Neurosurgery;  Laterality: Left;  Stereotactic Left Brain biopsy with brainlab   BRAIN SURGERY     CARDIOVASCULAR STRESS TEST  01-14-2003   NO ISCHEMIA / EF 61%/ NORMAL LE WALL MOTION   EXCISION LEFT WRIST GANGLIAN/ MYXOID CYST  05-30-2009   IR RADIOLOGIST EVAL & MGMT  02/01/2019   NASAL SINUS SURGERY     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 Renda Rolls   TRANSTHORACIC ECHOCARDIOGRAM  10-17-2008   DR CRENSHAW   NORMAL LVF/ EF 60%/  MILDLY DILATED RIGHT ATRIUM/ VENTRICULE   VARICOCELECTOMY  1991   Dr. Tresa Endo   Social History:  reports that he has never smoked. He has never used smokeless tobacco. He reports that he does not drink alcohol and does not use drugs.  Allergies  Allergen Reactions   Nsaids Other (See Comments)    Cannot take-per MD   Other Other (See Comments)     All blood thinners-cannot take per MD   Aspirin Other (See Comments)    nosebleeds   Statins Other (See Comments)    Weakness, myalgias   Demeclocycline Other (See Comments)    Passed out   Dust Mite Extract Other (See Comments)   Tizanidine Hcl Other (See Comments)    Patient stated that after taking this medication he experienced feelings of dizziness and disorientation.     Family History  Problem Relation Age of Onset   Diabetes Mother    Hypertension Mother    Pancreatic cancer Mother    Stroke Mother    Kidney failure Father    Hypertension Sister    Healthy Son     Prior to Admission medications   Medication Sig Start Date End Date Taking? Authorizing Provider  acetaminophen (TYLENOL) 650 MG CR tablet Take 1,300 mg by mouth 2 (two) times daily.    [provider]  albuterol (VENTOLIN HFA) 108 (90 Base) MCG/ACT inhaler Inhale 2 puffs into the lungs every 6 (six) hours as needed for wheezing or shortness of breath. 12/25/22   Denita Lung, MD  amoxicillin (AMOXIL) 500 MG capsule SMARTSIG:4 Capsule(s) By Mouth PRN for dental visits Patient not taking: Reported on 12/23/2022 09/06/22   Early, [name], NP  Ascorbic Acid (VITAMIN C) 1000 MG tablet 1 tablet    [provider]  azithromycin (ZITHROMAX) 250 MG tablet Take 2 tablets on day 1, then 1 tablet daily on days 2 through 5 12/23/22 12/28/22  Early, [name], NP  benzonatate (TESSALON) 200 MG capsule Take 1 capsule (200 mg total) by mouth 3 (three) times daily as needed for cough. 12/23/22   [name], NP  Cholecalciferol 125 MCG (5000 UT) capsule Take 5,000 Units by mouth daily.    [provider]  clidinium-chlordiazePOXIDE (LIBRAX) 5-2.5 MG capsule Take 1 capsule by mouth 3 (three) times daily before meals. 08/10/22   [name], NP  clorazepate (TRANXENE-T) 7.5 MG tablet Take 1 tablet (7.5 mg total) by mouth at bedtime as needed for anxiety. 09/06/22   [name], NP  COVID-19 mRNA vaccine [phone]  (COMIRNATY) syringe Inject into the muscle. Patient not taking: Reported on 10/28/2022 10/06/22     docusate sodium (COLACE) 100 MG capsule Take 200 mg by mouth at bedtime.    [provider]  fluorometholone (FML) 0.1 % ophthalmic suspension SMARTSIG:1 Drop(s) In Eye(s) 3 Times Daily PRN 01/27/22   [provider]  fluorouracil (EFUDEX) 5 % cream daily 10/29/20   [provider]  gabapentin (NEURONTIN) 100 MG capsule Take 1 capsule (100 mg total) by mouth 2 (two) times daily. 09/06/22   [name], NP  HYDROcodone bit-homatropine (HYCODAN) 5-1.5 MG/5ML syrup Take 5 mLs by mouth every 8 (eight) hours as needed  for cough. 12/23/22   [name], NP  Iron, Ferrous Sulfate, 325 (65 Fe) MG TABS Take 325 mg by mouth in the morning, at noon, and at bedtime. 08/23/21   Barb Merino, MD  levocetirizine (XYZAL) 5 MG tablet Take 1 tablet (5 mg total) by mouth every evening. 06/27/18   Janith Lima, MD  levothyroxine (SYNTHROID) 150 MCG tablet Take 1 tablet (150 mcg total) by mouth daily before breakfast. 05/06/22   Early, [name], NP  magic mouthwash (nystatin, hydrocortisone, diphenhydrAMINE) suspension Swish and swallow 5 mLs 3 (three) times daily as needed (mouth and throat irritation). 12/23/22   [name], NP  Magnesium 250 MG TABS Take 250 mg by mouth at bedtime.     [provider]  meclizine (ANTIVERT) 25 MG tablet Take 1 tablet (25 mg total) by mouth 2 (two) times daily as needed for dizziness. 10/28/22   [name], NP  methocarbamol (ROBAXIN) 500 MG tablet Take 1 tablet (500 mg total) by mouth every 8 (eight) hours as needed for muscle spasms. 10/26/21   Janith Lima, MD  montelukast (SINGULAIR) 10 MG tablet TAKE 1 TABLET BY MOUTH AT  BEDTIME 07/02/22   Early, [name], NP  Multiple Vitamin (MULTIVITAMIN WITH MINERALS) TABS tablet Take 1 tablet by mouth every morning.    [provider]  pantoprazole (PROTONIX) 40 MG tablet TAKE 1 TABLET BY MOUTH TWICE   DAILY 07/02/22   Early, [name], NP  silodosin (RAPAFLO) 8 MG CAPS capsule Take 1 capsule (8 mg total) by mouth at bedtime. TAKE ONE CAPSULE BY MOUTH DAILY AT BEDTIME 06/11/22   Early, [name], NP  valsartan (DIOVAN) 40 MG tablet TAKE 1 TABLET BY MOUTH EVERY DAY 08/30/22   Early, [name], NP  Zoster Vaccine Adjuvanted [facility]) injection Inject into the muscle. Patient not taking: Reported on 12/23/2022 10/25/22   Carlyle Basques, MD    Physical Exam: Vitals:   12/27/22 1231 12/27/22 1300 12/27/22 1330 12/27/22 1508  BP:  126/69 111/74 (!) 146/75  Pulse:  79 86 85  Resp:  '20 20 17  '$ Temp: 98 F (36.7 C)     TempSrc: Oral     SpO2:  92% 97% 92%   General:  Appears calm and comfortable and is in NAD, on facemask O2 (can't wear [place]) Eyes:  EOMI, normal lids, iris ENT:  grossly normal hearing, lips & tongue, mmm Neck:  no LAD, masses or thyromegaly Cardiovascular:  RRR, no m/r/g. No LE edema.  Respiratory:   Diffuse rhonchi.  Normal respiratory effort on 4L [place] O2 Abdomen:  soft, NT, ND Skin:  no rash or induration seen on limited exam Musculoskeletal:  grossly normal tone BUE/BLE, good ROM, no bony abnormality Psychiatric:  grossly normal mood and affect, speech fluent and appropriate, AOx3 Neurologic:  CN 2-12 grossly intact, moves all extremities in coordinated fashion   Radiological Exams on Admission: Independently reviewed - see discussion in A/P where applicable  DG Chest Port 1 View  Result Date: 12/27/2022 CLINICAL DATA:  Shortness of breath.  RSV EXAM: PORTABLE CHEST 1 VIEW COMPARISON:  Yesterday FINDINGS: Normal heart size and stable mediastinal contours. Stable interstitial coarsening. 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 No effusion or air bronchogram. IMPRESSION: Stable from prior.  No focal pneumonia. Electronically Signed   By: Jorje Guild M.D.   On: 12/27/2022 04:07   DG Chest 2 View  Result Date: 12/26/2022 CLINICAL DATA:  Shortness  of breath EXAM: CHEST - 2 VIEW COMPARISON:  Chest x-ray 12/07/2020.  CT chest 08/17/2021. 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 There is no new lung infiltrate, pleural effusion or pneumothorax. The cardiomediastinal silhouette is within normal limits. No acute fractures are identified. IMPRESSION: No active cardiopulmonary disease. Electronically Signed   By: Ronney Asters M.D.   On: 12/26/2022 18:24    EKG: Independently reviewed.  NSR with rate 79; no evidence of acute ischemia   Labs on Admission: I have personally reviewed the available labs and imaging studies at the time of the admission.  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[place])   Benign prostatic hyperplasia with urinary obstruction   Pulmonary arteriovenous malformation   Essential hypertension   PAF (paroxysmal atrial fibrillation) (HCC)   Dyslipidemia    RSV Infection with hypoxia -Supportive care -Supplemental O2 as needed - has to wear face mask O2 due to telangiectasias -IVF at 50 cc/hr x 10 hours -Observation in progressive care -Anticipate resolution of hypoxia and improvement in symptoms as RSV improves   Hereditary telangiectasis -Osler-Weber-Rendu -h/o AVMs in the mouth, face, scalp, lungs, GI tract -No AC  ILD -Not on home O2 -Continue Singulair -Chronic cough, DOE -Likely related to pulmonary AVMs -Followed by pulm  BPH -Continue Rapaflo  HTN -Continue valsartan  Hypothyroidism -Continue Synthroid  Aflutter -Appears to have been an isolated episode -No AC regardless  HLD -Not on statins due to allergies   Advance Care Planning:   Code Status: Full Code - Code status was discussed with the patient and/or family at the time of admission.  The patient would want to receive full resuscitative measures at this time.    Consults: Nutrition; PT/OT; TOC team  DVT Prophylaxis: SCDs  Family Communication: Wife was present throughout evaluation  Severity of Illness: The appropriate patient status for this patient is OBSERVATION. Observation status is judged to be reasonable and necessary in order to provide the required intensity of service to ensure the patient's safety. The patient's presenting symptoms, physical exam findings, and initial radiographic and laboratory data in the context of their medical condition is felt to place them at decreased risk for further clinical deterioration. Furthermore, it is anticipated that the patient will be medically stable for discharge from the hospital within 2 midnights of admission.   Author: Karmen Bongo, MD 12/27/2022 4:49 PM  For on call review [email or web address].

## 2022-12-28 ENCOUNTER — Telehealth: Payer: Self-pay | Admitting: Pulmonary Disease

## 2022-12-28 ENCOUNTER — Observation Stay (HOSPITAL_COMMUNITY): Payer: Medicare Other

## 2022-12-28 DIAGNOSIS — E871 Hypo-osmolality and hyponatremia: Secondary | ICD-10-CM | POA: Diagnosis not present

## 2022-12-28 DIAGNOSIS — Z888 Allergy status to other drugs, medicaments and biological substances status: Secondary | ICD-10-CM | POA: Diagnosis not present

## 2022-12-28 DIAGNOSIS — Z91048 Other nonmedicinal substance allergy status: Secondary | ICD-10-CM | POA: Diagnosis not present

## 2022-12-28 DIAGNOSIS — J9601 Acute respiratory failure with hypoxia: Secondary | ICD-10-CM | POA: Diagnosis not present

## 2022-12-28 DIAGNOSIS — E039 Hypothyroidism, unspecified: Secondary | ICD-10-CM | POA: Diagnosis present

## 2022-12-28 DIAGNOSIS — Z833 Family history of diabetes mellitus: Secondary | ICD-10-CM | POA: Diagnosis not present

## 2022-12-28 DIAGNOSIS — J121 Respiratory syncytial virus pneumonia: Secondary | ICD-10-CM | POA: Diagnosis present

## 2022-12-28 DIAGNOSIS — K219 Gastro-esophageal reflux disease without esophagitis: Secondary | ICD-10-CM | POA: Diagnosis present

## 2022-12-28 DIAGNOSIS — Z886 Allergy status to analgesic agent status: Secondary | ICD-10-CM | POA: Diagnosis not present

## 2022-12-28 DIAGNOSIS — Z9981 Dependence on supplemental oxygen: Secondary | ICD-10-CM | POA: Diagnosis not present

## 2022-12-28 DIAGNOSIS — E7800 Pure hypercholesterolemia, unspecified: Secondary | ICD-10-CM | POA: Diagnosis present

## 2022-12-28 DIAGNOSIS — I780 Hereditary hemorrhagic telangiectasia: Secondary | ICD-10-CM | POA: Diagnosis present

## 2022-12-28 DIAGNOSIS — N401 Enlarged prostate with lower urinary tract symptoms: Secondary | ICD-10-CM | POA: Diagnosis present

## 2022-12-28 DIAGNOSIS — J9621 Acute and chronic respiratory failure with hypoxia: Secondary | ICD-10-CM | POA: Diagnosis present

## 2022-12-28 DIAGNOSIS — Z1152 Encounter for screening for COVID-19: Secondary | ICD-10-CM | POA: Diagnosis not present

## 2022-12-28 DIAGNOSIS — B338 Other specified viral diseases: Secondary | ICD-10-CM | POA: Diagnosis not present

## 2022-12-28 DIAGNOSIS — I4892 Unspecified atrial flutter: Secondary | ICD-10-CM | POA: Diagnosis present

## 2022-12-28 DIAGNOSIS — Z85828 Personal history of other malignant neoplasm of skin: Secondary | ICD-10-CM | POA: Diagnosis not present

## 2022-12-28 DIAGNOSIS — J210 Acute bronchiolitis due to respiratory syncytial virus: Secondary | ICD-10-CM | POA: Diagnosis present

## 2022-12-28 DIAGNOSIS — Q2572 Congenital pulmonary arteriovenous malformation: Secondary | ICD-10-CM | POA: Diagnosis not present

## 2022-12-28 DIAGNOSIS — Z8249 Family history of ischemic heart disease and other diseases of the circulatory system: Secondary | ICD-10-CM | POA: Diagnosis not present

## 2022-12-28 DIAGNOSIS — Z823 Family history of stroke: Secondary | ICD-10-CM | POA: Diagnosis not present

## 2022-12-28 DIAGNOSIS — N138 Other obstructive and reflux uropathy: Secondary | ICD-10-CM | POA: Diagnosis present

## 2022-12-28 DIAGNOSIS — I480 Paroxysmal atrial fibrillation: Secondary | ICD-10-CM | POA: Diagnosis present

## 2022-12-28 DIAGNOSIS — Z841 Family history of disorders of kidney and ureter: Secondary | ICD-10-CM | POA: Diagnosis not present

## 2022-12-28 DIAGNOSIS — I10 Essential (primary) hypertension: Secondary | ICD-10-CM | POA: Diagnosis present

## 2022-12-28 DIAGNOSIS — I272 Pulmonary hypertension, unspecified: Secondary | ICD-10-CM | POA: Diagnosis not present

## 2022-12-28 LAB — MAGNESIUM: Magnesium: 1.9 mg/dL (ref 1.7–2.4)

## 2022-12-28 LAB — CBC
HCT: 39.8 % (ref 39.0–52.0)
Hemoglobin: 14.2 g/dL (ref 13.0–17.0)
MCH: 31.6 pg (ref 26.0–34.0)
MCHC: 35.7 g/dL (ref 30.0–36.0)
MCV: 88.4 fL (ref 80.0–100.0)
Platelets: 266 10*3/uL (ref 150–400)
RBC: 4.5 MIL/uL (ref 4.22–5.81)
RDW: 12.9 % (ref 11.5–15.5)
WBC: 20.9 10*3/uL — ABNORMAL HIGH (ref 4.0–10.5)
nRBC: 0 % (ref 0.0–0.2)

## 2022-12-28 LAB — BASIC METABOLIC PANEL
Anion gap: 11 (ref 5–15)
BUN: 13 mg/dL (ref 8–23)
CO2: 25 mmol/L (ref 22–32)
Calcium: 8.4 mg/dL — ABNORMAL LOW (ref 8.9–10.3)
Chloride: 92 mmol/L — ABNORMAL LOW (ref 98–111)
Creatinine, Ser: 0.88 mg/dL (ref 0.61–1.24)
GFR, Estimated: >60 mL/min (ref >60)
Glucose, Bld: 98 mg/dL (ref 70–99)
Potassium: 4 mmol/L (ref 3.5–5.1)
Sodium: 128 mmol/L — ABNORMAL LOW (ref 135–145)

## 2022-12-28 LAB — CREATININE, URINE, RANDOM: Creatinine, Urine: 113 mg/dL

## 2022-12-28 LAB — C-REACTIVE PROTEIN: CRP: 10.7 mg/dL — ABNORMAL HIGH (ref <1.0)

## 2022-12-28 LAB — URIC ACID: Uric Acid, Serum: 3.3 mg/dL — ABNORMAL LOW (ref 3.7–8.6)

## 2022-12-28 LAB — BRAIN NATRIURETIC PEPTIDE: B Natriuretic Peptide: 136.5 pg/mL — ABNORMAL HIGH (ref 0.0–100.0)

## 2022-12-28 LAB — PROCALCITONIN: Procalcitonin: <0.1 ng/mL

## 2022-12-28 LAB — SODIUM, URINE, RANDOM: Sodium, Ur: 144 mmol/L

## 2022-12-28 MED ORDER — METHYLPREDNISOLONE SODIUM SUCC 125 MG IJ SOLR
60.0000 mg | INTRAMUSCULAR | Status: DC
  Administered 2022-12-28 – 2022-12-31 (×4): 60 mg via INTRAVENOUS
  Filled 2022-12-28 (×4): qty 2

## 2022-12-28 MED ORDER — BUDESONIDE 0.25 MG/2ML IN SUSP
0.2500 mg | Freq: Two times a day (BID) | RESPIRATORY_TRACT | Status: DC
  Administered 2022-12-28 – 2023-01-04 (×14): 0.25 mg via RESPIRATORY_TRACT
  Filled 2022-12-28 (×14): qty 2

## 2022-12-28 MED ORDER — ARFORMOTEROL TARTRATE 15 MCG/2ML IN NEBU
15.0000 ug | INHALATION_SOLUTION | Freq: Two times a day (BID) | RESPIRATORY_TRACT | Status: DC
  Administered 2022-12-28 – 2023-01-04 (×14): 15 ug via RESPIRATORY_TRACT
  Filled 2022-12-28 (×14): qty 2

## 2022-12-28 MED ORDER — FUROSEMIDE 10 MG/ML IJ SOLN
40.0000 mg | Freq: Once | INTRAMUSCULAR | Status: AC
  Administered 2022-12-28: 40 mg via INTRAVENOUS
  Filled 2022-12-28: qty 4

## 2022-12-28 MED ORDER — IPRATROPIUM-ALBUTEROL 0.5-2.5 (3) MG/3ML IN SOLN
3.0000 mL | Freq: Four times a day (QID) | RESPIRATORY_TRACT | Status: DC
  Administered 2022-12-28 – 2022-12-29 (×3): 3 mL via RESPIRATORY_TRACT
  Filled 2022-12-28 (×4): qty 3

## 2022-12-28 MED ORDER — ENSURE ENLIVE PO LIQD
237.0000 mL | Freq: Two times a day (BID) | ORAL | Status: DC
  Administered 2022-12-28 – 2023-01-04 (×10): 237 mL via ORAL

## 2022-12-28 MED ORDER — LACTATED RINGERS IV SOLN: INTRAVENOUS | Status: DC

## 2022-12-28 MED ORDER — ADULT MULTIVITAMIN W/MINERALS CH
1.0000 | ORAL_TABLET | Freq: Every day | ORAL | Status: DC
  Administered 2022-12-28 – 2023-01-04 (×8): 1 via ORAL
  Filled 2022-12-28 (×8): qty 1

## 2022-12-28 NOTE — Progress Notes (Signed)
Patient up to chair, standby. Coughing and deep breathing exercises and reinforced IS teaching.

## 2022-12-28 NOTE — Telephone Encounter (Signed)
PT is in [place] now. Do we have a Dr. from out practice that can come see him? He is in Room [phone]. Good call back [phone]

## 2022-12-28 NOTE — Evaluation (Signed)
Physical Therapy Evaluation Patient Details Name: Gregory [name] MRN: [id number] DOB: 02-25-1941 Today's Date: 12/28/2022  History of Present Illness  82 yo male presents to [facility] on 1/7 with ShOB requiring supplemental O2, d/ced and returned on 1/8 for continued O2 desaturation. + RSV. PMH includes  BPH, HLD, hereditary telangiectasias, hypothyroidism, aflutter, and ILD.  Clinical Impression   Pt presents with min functional weakness, impaired balance (baseline), decreased activity tolerance, and increased O2 requirements during mobility vs baseline. Pt to benefit from acute PT to address deficits. Pt ambulated hallway distance without AD but close guard to supervision for gait, no AD needed. 10LO2 via venturi mask, SpO2 88% and greater. Pt following with OPPT prior to admission, recommend continuing. PT to progress mobility as tolerated, and will continue to follow acutely.         Recommendations for follow up therapy are one component of a multi-disciplinary discharge planning process, led by the attending physician.  Recommendations may be updated based on patient status, additional functional criteria and insurance authorization.  Follow Up Recommendations Other (comment) (continue OPPT for balance)      Assistance Recommended at Discharge PRN  Patient can return home with the following  A little help with walking and/or transfers    Equipment Recommendations None recommended by PT  Recommendations for Other Services       Functional Status Assessment Patient has had a recent decline in their functional status and demonstrates the ability to make significant improvements in function in a reasonable and predictable amount of time.     Precautions / Restrictions Precautions Precautions: Fall Precaution Comments: Premorbid loses balance backwards Restrictions Weight Bearing Restrictions: No      Mobility  Bed Mobility Overal bed mobility: Needs Assistance              General bed mobility comments: up in recliner    Transfers Overall transfer level: Needs assistance Equipment used: None Transfers: Sit to/from Stand Sit to Stand: Supervision           General transfer comment: for safety, PT managing lines/leads    Ambulation/Gait Ambulation/Gait assistance: Supervision, Min guard Gait Distance (Feet): 250 Feet Assistive device: None Gait Pattern/deviations: Step-through pattern, Decreased stride length Gait velocity: decr     General Gait Details: initially close guard given min unsteadiness (pt-corrected), transitioning to supervision with further distance. Forward flexion of shoulder girdle, cues for pursed breathing technique  Stairs            Wheelchair Mobility    Modified Rankin (Stroke Patients Only)       Balance Overall balance assessment: Needs assistance Sitting-balance support: No upper extremity supported Sitting balance-Leahy Scale: Good     Standing balance support: No upper extremity supported Standing balance-Leahy Scale: Fair                 High Level Balance Comments: mild impairment (weaving of gait, unsteadiness pt-recovered): horizontal and vertical turns, WFL: 180 deg direction change, fast slow gait             Pertinent Vitals/Pain Pain Assessment Pain Assessment: Faces Faces Pain Scale: Hurts a little bit Pain Location: L hip Pain Descriptors / Indicators: Sore, Other (Comment) (chronic) Pain Intervention(s): Limited activity within patient's tolerance, Monitored during session, Repositioned    Home Living Family/patient expects to be discharged to:: Private residence Living Arrangements: Spouse/significant other Available Help at Discharge: Family;Available 24 hours/day Type of Home: House Home Access: Stairs to enter Entrance Stairs-Rails: Right;Left;Can reach  both Entrance Stairs-Number of Steps: 4 Alternate Level Stairs-Number of Steps: bonus room upstaris,  13  Stairs Home Layout: Two level;Able to live on main level with bedroom/bathroom Home Equipment: Rolling Walker (2 wheels);Rollator (4 wheels);BSC/3in1;Grab bars - tub/shower Additional Comments: DME is other family members    Prior Function Prior Level of Function : Independent/Modified Independent;Driving                     Hand Dominance   Dominant Hand: Right    Extremity/Trunk Assessment   Upper Extremity Assessment Upper Extremity Assessment: Defer to OT evaluation    Lower Extremity Assessment Lower Extremity Assessment: Generalized weakness (at least 4/5)    Cervical / Trunk Assessment Cervical / Trunk Assessment: Kyphotic  Communication   Communication: No difficulties  Cognition Arousal/Alertness: Awake/alert Behavior During Therapy: WFL for tasks assessed/performed Overall Cognitive Status: Within Functional Limits for tasks assessed                                          General Comments General comments (skin integrity, edema, etc.): 10LO2 via venturi mask, SpO2 88% and greater    Exercises     Assessment/Plan    PT Assessment Patient needs continued PT services  PT Problem List Decreased strength;Decreased mobility;Decreased activity tolerance;Decreased balance;Cardiopulmonary status limiting activity       PT Treatment Interventions DME instruction;Therapeutic activities;Gait training;Therapeutic exercise;Patient/family education;Balance training;Stair training;Functional mobility training;Neuromuscular re-education    PT Goals (Current goals can be found in the Care Plan section)  Acute Rehab PT Goals PT Goal Formulation: With patient Time For Goal Achievement: 01/11/23 Potential to Achieve Goals: Good    Frequency Min 3X/week     Co-evaluation               AM-PAC PT "6 Clicks" Mobility  Outcome Measure Help needed turning from your back to your side while in a flat bed without using bedrails?: None Help  needed moving from lying on your back to sitting on the side of a flat bed without using bedrails?: None Help needed moving to and from a bed to a chair (including a wheelchair)?: A Little Help needed standing up from a chair using your arms (e.g., wheelchair or bedside chair)?: A Little Help needed to walk in hospital room?: A Little Help needed climbing 3-5 steps with a railing? : A Little 6 Click Score: 20    End of Session Equipment Utilized During Treatment: Oxygen Activity Tolerance: Patient tolerated treatment well Patient left: in chair;with call bell/phone within reach;with family/visitor present Nurse Communication: Other (comment);Mobility status (O2 requirements, PT dropped pt to 10LO2) PT Visit Diagnosis: Other abnormalities of gait and mobility (R26.89)    Time: 1275-1700 PT Time Calculation (min) (ACUTE ONLY): 21 min   Charges:   PT Evaluation $PT Eval Low Complexity: 1 Low         Conn Trombetta S, PT DPT Acute Rehabilitation Services Pager [phone]  Office [phone]   [place] E Ruffin Pyo 12/28/2022, 5:03 PM

## 2022-12-28 NOTE — Progress Notes (Signed)
Initial Nutrition Assessment  DOCUMENTATION CODES:   Not applicable  INTERVENTION:   Multivitamin w/ minerals daily Ensure Enlive po BID, each supplement provides 350 kcal and 20 grams of protein. Encourage good PO intake   NUTRITION DIAGNOSIS:   Increased nutrient needs related to acute illness, chronic illness as evidenced by estimated needs.  GOAL:   Patient will meet greater than or equal to 90% of their needs  MONITOR:   PO intake, Supplement acceptance, Weight trends, Labs  REASON FOR ASSESSMENT:   Consult  (Nutritional Goals)  ASSESSMENT:   82 y.o. male presented to the ED with shortness of breath. PMH includes HLD, GERD, IBS, ILD, HTN, CHF, and A flutter. Pt admitted with RSV.  RD working remotely at time of assessment. Discussed case with RN. RN shares he assisted ordering pt breakfast and that pt consumed ~75% of the meal.  Per EMR, pt has not had any significant weight loss over the past year.   Medications reviewed and include: Colace, Protonix  Labs reviewed: Sodium 128   UOP: 600 mL x 24 hours  NUTRITION - FOCUSED PHYSICAL EXAM:  Deferred to follow-up.   Diet Order:   Diet Order             Diet regular Room service appropriate? Yes; Fluid consistency: Thin  Diet effective now                   EDUCATION NEEDS:   No education needs have been identified at this time  Skin:  Skin Assessment: Reviewed RN Assessment  Last BM:  1/6  Height:   Ht Readings from Last 1 Encounters:  12/26/22 '5\' 10"'$  (1.778 m)    Weight:   Wt Readings from Last 1 Encounters:  12/26/22 81.2 kg    Ideal Body Weight:  75.5 kg  BMI:  There is no height or weight on file to calculate BMI.  Estimated Nutritional Needs:  Kcal:  2100-2300 Protein:  100-120 grams Fluid:  >/= 2 L   Hermina Barters RD, LDN Clinical Dietitian See [facility] for contact information.

## 2022-12-28 NOTE — Consult Note (Signed)
NAME:  Gregory [name], MRN:  [id number], DOB:  04-27-1941, LOS: 0 ADMISSION DATE:  12/27/2022, CONSULTATION DATE: 12/28/2022 REFERRING MD: [facility], CHIEF COMPLAINT: Proximal respiratory failure    History of Present Illness:  Gregory [name] is an 82 year old male who is followed by Dr. 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  He presents with increasing hypoxia positive for RSV now up to 50% facemask with O2 sats of 88 to 92%.  He has been treated with steroids diuretics and reports feeling better.  Due to his underlying interstitial lung disease pulmonary critical care was asked to evaluate.  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  There is some concern that he may have a "walking pneumonia..  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  WITHOUT HEMOPTYSIS   History of nonmelanoma skin cancer EXCISION SQUAMOUS CELL FROM HAND   Hypertrophy of prostate with urinary obstruction and other lower urinary tract symptoms (LUTS)    ILD (interstitial lung disease) (HCC)    Irritable bowel syndrome    Nocturnal leg cramps 06/11/2013   Plantar fascial fibromatosis    Pure hypercholesterolemia    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 rate (!) 22, SpO2 (!) 88 %.    FiO2 (%):  [55 %] 55 %   Intake/Output Summary (Last 24 hours) at 12/28/2022 1206 Last data filed at 12/28/2022 0846 Gross per 24 hour  Intake 15.33 ml  Output 1000 ml  Net -984.67 ml   There were no vitals filed for this visit.  Examination: General: Well-nourished well-developed male no acute distress currently on 50% FiO2 HENT: No JVD or lymphadenopathy is appreciated Lungs: With expiratory wheeze bibasilar crackles Cardiovascular: Heart sounds are regular Abdomen: Soft nontender positive bowel sounds Extremities: Mild edema Neuro: Intact GU: Voids  Resolved Hospital Problem list     Assessment & Plan:  Acute on chronic hypoxic respiratory failure in setting of ILD positive for RSV. Agree with diuresis O2 to keep sats greater than 88% currently on 50% Pulmonary toilet Steroids consider changing from daily to 3 times a day Cough suppressant May need intensive care unit in future History of left and right AVMs   History of hypertension Continue antihypertensives  History of hypothyroidism Continue thyroid supplement.  Anxiety Anxiolytics  Best Practice (right click and "Reselect all SmartList Selections" daily)   Diet/type: Regular consistency (see orders) DVT prophylaxis:  GI prophylaxis: PPI Lines: N/A Foley:  N/A Code Status:  full code Last date of multidisciplinary goals of care discussion tbd March  20 admit goal needing more initiate run out of patient  Labs   CBC: Recent Labs  Lab 12/24/22 0953 12/26/22 1731 12/28/22 0443  WBC 11.8* 15.6* 20.9*  NEUTROABS 7.4 13.1*  --   HGB 14.8 14.7 14.2  HCT 43.0 42.7 39.8  MCV 90.7 90.1 88.4  PLT 245 278 497    Basic Metabolic Panel: Recent Labs  Lab 12/26/22 1731  12/28/22 0439 12/28/22 0443  NA 132*  --  128*  K 4.8  --  4.0  CL 95*  --  92*  CO2 25  --  25  GLUCOSE 129*  --  98  BUN 18  --  13  CREATININE 0.94  --  0.88  CALCIUM 9.7  --  8.4*  MG  --  1.9  --    GFR: Estimated Creatinine Clearance: 69.1 mL/min (by C-G formula based on SCr of 0.88 mg/dL). Recent Labs  Lab 12/24/22 0953 12/26/22 1731 12/28/22 0439 12/28/22 0443  PROCALCITON  --   --  <0.10  --   WBC 11.8* 15.6*  --  20.9*    Liver Function Tests: Recent Labs  Lab 12/26/22 1731  AST 30  ALT 24  ALKPHOS 59  BILITOT 0.3  PROT 7.5  ALBUMIN 4.6   No results for input(s): "LIPASE", "AMYLASE" in the last 168 hours. No results for input(s): "AMMONIA" in the last 168 hours.  ABG No results found for: "PHART", "PCO2ART", "PO2ART", "HCO3", "TCO2", "ACIDBASEDEF", "O2SAT"   Coagulation Profile: No results for input(s): "INR", "PROTIME" in the last 168 hours.  Cardiac Enzymes: No results for input(s): "CKTOTAL", "CKMB", "CKMBINDEX", "TROPONINI" in the last 168 hours.  HbA1C: Hgb A1c MFr Bld  Date/Time Value Ref Range Status  10/26/2021 01:36 PM 5.2 4.6 - 6.5 % Final    Comment:    Glycemic Control Guidelines for People with Diabetes:Non Diabetic:  <6%Goal of Therapy: <7%Additional Action Suggested:  >8%   10/23/2020 10:50 AM 6.3 4.6 - 6.5 % Final    Comment:    Glycemic Control Guidelines for People with Diabetes:Non Diabetic:  <6%Goal of Therapy: <7%Additional Action Suggested:  >8%     CBG: No results for input(s): "GLUCAP" in the last 168 hours.  Review of Systems:   10 point review of system taken, please see HPI for positives and negatives. Positive for nonproductive cough for over 10 years, utilizes multiple cough suppressants and rotates around. Positive for increasing shortness of breath for the last 48 hours.   Past Medical History:  He,  has a past medical history of Anxiety state, unspecified, Arthritis, Chronic cough (10/09/2019), Chronic  hyponatremia (10/24/2019), GERD (gastroesophageal reflux disease), H/O arteriovenous malformation (AVM) (CHRONIC RLL ON CXR  WITHOUT HEMOPTYSIS), History of nonmelanoma skin cancer (EXCISION SQUAMOUS CELL FROM HAND), Hypertrophy of prostate with urinary obstruction and other lower urinary tract symptoms (LUTS), ILD (interstitial lung disease) ([place]), Irritable bowel syndrome, Nocturnal leg cramps (06/11/2013), Plantar fascial fibromatosis, Pure hypercholesterolemia, 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   Surgical History:   Past Surgical History:  Procedure Laterality Date   APPLICATION OF CRANIAL NAVIGATION N/A 05/16/2015   Procedure: APPLICATION OF CRANIAL NAVIGATION;  Surgeon: Consuella Lose, MD;  Location: [place] [facility];  Service: Neurosurgery;  Laterality: N/A;   BRAIN BIOPSY Left 05/16/2015   Procedure: Stereotactic Left Brain Biopsy with Brain Lab;  Surgeon: Consuella Lose, MD;  Location:  [place] [facility];  Service: Neurosurgery;  Laterality: Left;  Stereotactic Left Brain biopsy with brainlab   BRAIN SURGERY     CARDIOVASCULAR STRESS TEST  01-14-2003   NO ISCHEMIA / EF 61%/ NORMAL LE WALL MOTION   EXCISION LEFT WRIST GANGLIAN/ MYXOID CYST  05-30-2009   IR RADIOLOGIST EVAL & MGMT  02/01/2019   NASAL SINUS SURGERY     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 Renda Rolls   TRANSTHORACIC ECHOCARDIOGRAM  10-17-2008   DR CRENSHAW   NORMAL LVF/ EF 60%/  MILDLY DILATED RIGHT ATRIUM/ VENTRICULE   VARICOCELECTOMY  1991   Dr. Tresa Endo     Social History:   reports that he has never smoked. He has never used smokeless tobacco. He reports that he does not drink alcohol and  does not use drugs.   Family History:  His family history includes Diabetes in his mother; Healthy in his son; Hypertension in his mother and sister; Kidney failure in his father; Pancreatic cancer in his mother; Stroke in his mother.   Allergies Allergies  Allergen Reactions   Nsaids Other (See Comments)    Cannot take-per MD   Other Other (See Comments)    All blood thinners-cannot take per MD   Aspirin Other (See Comments)    nosebleeds   Statins Other (See Comments)    Weakness, myalgias   Demeclocycline Other (See Comments)    Passed out   Dust Mite Extract Other (See Comments)   Tizanidine Hcl Other (See Comments)    Patient stated that after taking this medication he experienced feelings of dizziness and disorientation.      Home Medications  Prior to Admission medications   Medication Sig Start Date End Date Taking? Authorizing Provider  acetaminophen (TYLENOL) 650 MG CR tablet Take 1,300 mg by mouth 2 (two) times daily.   Yes [provider]  albuterol (VENTOLIN HFA) 108 (90 Base) MCG/ACT inhaler Inhale 2 puffs into the lungs every 6 (six) hours as needed for wheezing or shortness of breath. 12/25/22  Yes Denita Lung, MD  benzonatate (TESSALON) 200 MG capsule Take 1 capsule (200 mg total) by mouth 3 (three) times daily as needed for cough. 12/23/22  Yes Early, [name], NP  Cholecalciferol 125 MCG (5000 UT) capsule Take 5,000 Units by mouth daily.   Yes [provider]  clidinium-chlordiazePOXIDE (LIBRAX) 5-2.5 MG capsule Take 1 capsule by mouth 3 (three) times daily before meals. Patient taking differently: Take 1 capsule by mouth daily as needed (IBS). 08/10/22  Yes Early, [name], NP  clorazepate (TRANXENE-T) 7.5 MG tablet Take 1 tablet (7.5 mg total) by mouth at bedtime as needed for anxiety. 09/06/22  Yes Early, [name], NP  docusate sodium (COLACE) 100 MG capsule Take 200 mg by mouth at bedtime.   Yes [provider]  fluorometholone (FML) 0.1 %  ophthalmic suspension Place 1 drop into both eyes 2 (two) times daily. 01/27/22  Yes [provider]  gabapentin (NEURONTIN) 100 MG capsule Take 1 capsule (100 mg total) by mouth 2 (two) times daily. 09/06/22  Yes Early, [name], NP  HYDROcodone bit-homatropine (HYCODAN) 5-1.5 MG/5ML syrup Take 5 mLs by mouth every 8 (eight) hours as needed for cough. 12/23/22  Yes Early, [name], NP  Iron, Ferrous Sulfate, 325 (  65 Fe) MG TABS Take 325 mg by mouth in the morning, at noon, and at bedtime. 08/23/21  Yes Barb Merino, MD  levocetirizine (XYZAL) 5 MG tablet Take 1 tablet (5 mg total) by mouth every evening. 06/27/18  Yes Janith Lima, MD  levothyroxine (SYNTHROID) 150 MCG tablet Take 1 tablet (150 mcg total) by mouth daily before breakfast. 05/06/22  Yes Early, [name], NP  magic mouthwash (nystatin, hydrocortisone, diphenhydrAMINE) suspension Swish and swallow 5 mLs 3 (three) times daily as needed (mouth and throat irritation). Patient taking differently: Swish and swallow 5 mLs 3 (three) times daily as needed for mouth pain. 12/23/22  Yes Early, [name], NP  Magnesium 250 MG TABS Take 250 mg by mouth at bedtime.    Yes [provider]  meclizine (ANTIVERT) 25 MG tablet Take 1 tablet (25 mg total) by mouth 2 (two) times daily as needed for dizziness. 10/28/22  Yes Early, [name], NP  methocarbamol (ROBAXIN) 500 MG tablet Take 1 tablet (500 mg total) by mouth every 8 (eight) hours as needed for muscle spasms. 10/26/21  Yes Janith Lima, MD  montelukast (SINGULAIR) 10 MG tablet TAKE 1 TABLET BY MOUTH AT  BEDTIME Patient taking differently: Take 10 mg by mouth at bedtime. 07/02/22  Yes Early, [name], NP  Multiple Vitamin (MULTIVITAMIN WITH MINERALS) TABS tablet Take 1 tablet by mouth every morning.   Yes [provider]  pantoprazole (PROTONIX) 40 MG tablet TAKE 1 TABLET BY MOUTH TWICE  DAILY Patient taking differently: Take 40 mg by mouth daily. 07/02/22  Yes Early, [name], NP  silodosin  (RAPAFLO) 8 MG CAPS capsule Take 1 capsule (8 mg total) by mouth at bedtime. TAKE ONE CAPSULE BY MOUTH DAILY AT BEDTIME 06/11/22  Yes Early, [name], NP  valsartan (DIOVAN) 40 MG tablet TAKE 1 TABLET BY MOUTH EVERY DAY Patient taking differently: Take 40 mg by mouth daily. 08/30/22  Yes Early, [name], NP  Ascorbic Acid (VITAMIN C) 1000 MG tablet Take 500 mg by mouth daily.    [provider]  fluorouracil (EFUDEX) 5 % cream daily 10/29/20   [provider]     Critical care time:     Gaylyn Lambert ACNP Acute Care Nurse Practitioner [place] Please consult [place] 12/28/2022, 12:07 PM

## 2022-12-28 NOTE — Progress Notes (Signed)
   12/28/22 0343  Assess: MEWS Score  Temp 99.8 F (37.7 C)  BP (!) 154/84  MAP (mmHg) 103  Pulse Rate (!) 116  ECG Heart Rate (!) 117  Resp 19  SpO2 92 %  O2 Device Simple Mask  O2 Flow Rate (L/min) 6 L/min  Assess: MEWS Score  MEWS Temp 0  MEWS Systolic 0  MEWS Pulse 2  MEWS RR 0  MEWS LOC 0  MEWS Score 2  MEWS Score Color Yellow  Assess: if the MEWS score is Yellow or Red  Were vital signs taken at a resting state? Yes  Focused Assessment Change from prior assessment (see assessment flowsheet)  Does the patient meet 2 or more of the SIRS criteria? Yes  Does the patient have a confirmed or suspected source of infection? Yes  Provider and Rapid Response Notified? Yes  MEWS guidelines implemented *See Row Information* No, other (Comment)  Treat  MEWS Interventions Escalated (See documentation below)  Take Vital Signs  Increase Vital Sign Frequency  Yellow: Q 2hr X 2 then Q 4hr X 2, if remains yellow, continue Q 4hrs  Escalate  MEWS: Escalate Yellow: discuss with charge nurse/RN and consider discussing with provider and RRT  Notify: Charge Nurse/RN  Name of Charge Nurse/RN Notified janie RN  Date Charge Nurse/RN Notified 12/28/22  Time Charge Nurse/RN Notified 0330  Provider Notification  Provider Name/Title E.Bridgett Larsson  Date Provider Notified 12/28/22  Time Provider Notified 0330  Method of Notification  (secure chat)  Notification Reason Other (Comment) (Tachycardic)  Provider response No new orders  Date of Provider Response 12/28/22  Time of Provider Response 0345  Assess: SIRS CRITERIA  SIRS Temperature  0  SIRS Pulse 1  SIRS Respirations  0  SIRS WBC 0  SIRS Score Sum  1

## 2022-12-28 NOTE — Progress Notes (Signed)
IS & Flutter at bedside. Patient demonstrated good effort  of flutter and states he is aware of how to use IS.Encouraged patient to use every 2 hours.

## 2022-12-28 NOTE — Evaluation (Signed)
Occupational Therapy Evaluation Patient Details Name: Gregory [name] MRN: [id number] DOB: 10-Dec-1941 Today's Date: 12/28/2022   History of Present Illness 82 yo male presents to [facility] on 1/7 with ShOB requiring supplemental O2, d/ced and returned on 1/8 for continued O2 desaturation. + RSV. PMH includes  BPH, HLD, hereditary telangiectasias, hypothyroidism, aflutter, and ILD.   Clinical Impression   Patient admitted for the diagnosis above.  PTA he lives with his spouse, and needed no assist with ADL, iADL, or mobility.  Does not use O2 at baseline, but does have mild balance issues at baseline.  Spouse stating "he doesn't walk backwards very well."  Primary deficit is activity tolerance and maintaining oxygen saturations. Currently he is needing up to [facility] for basic in room mobility and ADL completion at a sit to stand level.  OT is indicated in the acute setting to maximize his functional status for an eventual return home.         Recommendations for follow up therapy are one component of a multi-disciplinary discharge planning process, led by the attending physician.  Recommendations may be updated based on patient status, additional functional criteria and insurance authorization.   Follow Up Recommendations  No OT follow up     Assistance Recommended at Discharge Set up Supervision/Assistance  Patient can return home with the following Assist for transportation    Functional Status Assessment  Patient has had a recent decline in their functional status and demonstrates the ability to make significant improvements in function in a reasonable and predictable amount of time.  Equipment Recommendations  Tub/shower seat    Recommendations for Other Services       Precautions / Restrictions Precautions Precautions: Fall Precaution Comments: Premorbid loses balance backwards Restrictions Weight Bearing Restrictions: No      Mobility Bed Mobility                General bed mobility comments: up in recliner Patient Response: Cooperative  Transfers Overall transfer level: Needs assistance Equipment used: None Transfers: Sit to/from Stand, Bed to chair/wheelchair/BSC Sit to Stand: Supervision     Step pivot transfers: Min guard            Balance Overall balance assessment: Needs assistance Sitting-balance support: Feet supported Sitting balance-Leahy Scale: Good     Standing balance support: No upper extremity supported Standing balance-Leahy Scale: Fair                             ADL either performed or assessed with clinical judgement   ADL       Grooming: Wash/dry hands;Oral care;Supervision/safety;Standing               Lower Body Dressing: Min guard;Sit to/from stand   Toilet Transfer: Nature conservation officer;Ambulation Toilet Transfer Details (indicate cue type and reason): line management                 Vision Baseline Vision/History: 1 Wears glasses Patient Visual Report: No change from baseline       Perception     Praxis      Pertinent Vitals/Pain Pain Assessment Pain Assessment: No/denies pain     Hand Dominance Right   Extremity/Trunk Assessment Upper Extremity Assessment Upper Extremity Assessment: Overall WFL for tasks assessed   Lower Extremity Assessment Lower Extremity Assessment: Defer to PT evaluation   Cervical / Trunk Assessment Cervical / Trunk Assessment: Kyphotic   Communication Communication Communication: No difficulties  Cognition Arousal/Alertness: Awake/alert Behavior During Therapy: WFL for tasks assessed/performed Overall Cognitive Status: Within Functional Limits for tasks assessed                                       General Comments   VSS on 50%FIO2 and 10L    Exercises     Shoulder Instructions      Home Living Family/patient expects to be discharged to:: Private residence Living Arrangements: Spouse/significant  other Available Help at Discharge: Family;Available 24 hours/day Type of Home: House Home Access: Stairs to enter [employer] of Steps: 4 Entrance Stairs-Rails: Right;Left;Can reach both Home Layout: Two level;Able to live on main level with bedroom/bathroom Alternate Level Stairs-Number of Steps: bonus room upstaris,  13 Stairs Alternate Level Stairs-Rails: Right Bathroom Shower/Tub: Tub/shower unit;Walk-in shower   Bathroom Toilet: Standard Bathroom Accessibility: Yes How Accessible: Accessible via walker Home Equipment: [place] (2 wheels);Rollator (4 wheels);BSC/3in1;Grab bars - tub/shower   Additional Comments: DME is other family members      Prior Functioning/Environment Prior Level of Function : Independent/Modified Independent;Driving                        OT Problem List: Decreased activity tolerance;Impaired balance (sitting and/or standing)      OT Treatment/Interventions: Self-care/ADL training;Therapeutic activities;Patient/family education;Balance training;DME and/or AE instruction    OT Goals(Current goals can be found in the care plan section) Acute Rehab OT Goals Patient Stated Goal: Hoping to get back home soon OT Goal Formulation: With patient Time For Goal Achievement: 01/11/23 Potential to Achieve Goals: Good ADL Goals Pt Will Perform Grooming: Independently;standing Pt Will Perform Lower Body Dressing: Independently;sit to/from stand Pt Will Transfer to Toilet: Independently;regular height toilet;ambulating  OT Frequency: Min 2X/week    Co-evaluation              AM-PAC OT "6 Clicks" Daily Activity     Outcome Measure Help from another person eating meals?: None Help from another person taking care of personal grooming?: A Little Help from another person toileting, which includes using toliet, bedpan, or urinal?: A Little Help from another person bathing (including washing, rinsing, drying)?: A Little Help from  another person to put on and taking off regular upper body clothing?: None Help from another person to put on and taking off regular lower body clothing?: A Little 6 Click Score: 20   End of Session Equipment Utilized During Treatment: Gait belt Nurse Communication: Mobility status  Activity Tolerance: Patient tolerated treatment well Patient left: in chair;with call bell/phone within reach;with family/visitor present  OT Visit Diagnosis: Unsteadiness on feet (R26.81)                Time: 8315-1761 OT Time Calculation (min): 22 min Charges:  OT General Charges $OT Visit: 1 Visit OT Evaluation $OT Eval Moderate Complexity: 1 Mod  12/28/2022  RP, OTR/L  Acute Rehabilitation Services  Office:  [phone]   Metta Clines 12/28/2022, 3:32 PM

## 2022-12-28 NOTE — Progress Notes (Signed)
Called to assess patient for O2 device due to low Spo2.. Due to anatomy of nasal passage patient requires a mask for oxygenation. Arrived via simple mask from another facility. Placed on 50% venturi mask.@ 14L. Patient currently 88%. RN aware.

## 2022-12-28 NOTE — Progress Notes (Signed)
PROGRESS NOTE                                                                                                                                                                                                             Patient Demographics:    Gregory [name], is a 82 y.o. male, DOB - 03/06/1941, QVZ:[id number]  Outpatient Primary MD for the patient is Early, [name], NP    LOS - 0  Admit date - 12/27/2022    Chief Complaint  Patient presents with   Shortness of Breath       Brief Narrative (HPI from H&P) 82 y.o. male with medical history significant of BPH, HLD, hereditary telangiectasias, hypothyroidism, aflutter, hyponatremia, and ILD presenting with SOB for 5-6 days, had outpt Z pack, oral Steroids without benefit, came to the ER where he was diagnosed with acute hypoxic respiratory failure due to RSV pneumonia/bronchitis, he was placed on a Venturi mask as he cannot tolerate nasal cannula oxygen due to hereditary telangiectasias inside his nostrils.  He was admitted to the hospital for further care.   Subjective:    Gregory [name] today has, No headache, No chest pain, No abdominal pain - No Nausea, No new weakness tingling or numbness, ++ cough & SOB   Assessment  & Plan :   Moderate to severe acute hypoxic respiratory failure due to RSV pneumonia/bronchitis in a patient with underlying ILD. He is currently on Venturi mask and satting in low 90s to high 80s, does have coarse breath sounds with some crackles, he also has some orthopnea.  BNP is mildly elevated.  He has failed outpatient oral steroids and Z-Pak, he will be placed on IV steroids along with IV Lasix, encouraged to sit up in chair use I-S and flutter valve for pulmonary toiletry.  Continue supplemental oxygen.  Will involve pulmonary critical care as well.  Overall quite tenuous right now.  Hyponatremia.  Acute on chronic.  Clinically has elevated BNP with  orthopnea, Lasix, urine and serum electrolytes and osmolality ordered.   Hypothyroidism.  On Synthroid.  BPH.  On Flomax.  GERD.  On PPI.  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  Currently no acute issues.  Paroxysmal atrial flutter.  Had an episode  which was isolated in the past, not on anticoagulation due to above, monitor.  Not on rate control medications.  Hypertension.  On ARB.  Monitor and adjust.       Condition - Extremely Guarded  Family Communication  :  Wife [name] [phone]  on 12/28/22  Code Status :  Full  Consults  :  PCCM  PUD Prophylaxis : PPI   Procedures  :            Disposition Plan  :    Status is: Observation  DVT Prophylaxis  :     SCDs Start: 12/27/22 1630    Lab Results  Component Value Date   PLT 266 12/28/2022    Diet :  Diet Order             Diet regular Room service appropriate? Yes; Fluid consistency: Thin  Diet effective now                    Inpatient Medications  Scheduled Meds:  docusate sodium  100 mg Oral BID   furosemide  40 mg Intravenous Once   gabapentin  100 mg Oral BID   irbesartan  37.5 mg Oral Daily   levothyroxine  150 mcg Oral Q0600   loratadine  10 mg Oral QPM   montelukast  10 mg Oral QHS   pantoprazole  40 mg Oral Daily   sodium chloride flush  3 mL Intravenous Q12H   tamsulosin  0.4 mg Oral QPC supper   Continuous Infusions: PRN Meds:.acetaminophen **OR** acetaminophen, albuterol, benzonatate, bisacodyl, clorazepate, fluorometholone, hydrALAZINE, HYDROcodone bit-homatropine, methocarbamol, ondansetron **OR** ondansetron (ZOFRAN) IV, polyethylene glycol  Antibiotics  :    Anti-infectives (From admission, onward)    None         Objective:   Vitals:   12/28/22 0343 12/28/22 0543 12/28/22 0800 12/28/22 0913  BP: (!) 154/84 134/67 (!) 144/70 (!) 144/70  Pulse:  (!) 116 (!) 105 93 96  Resp: 19 (!) 22 (!) 24 (!) 22  Temp: 99.8 F (37.7 C) 98.6 F (37 C) 98.6 F (37 C)   TempSrc: Axillary Axillary Oral   SpO2: 92% 91% 93% (!) 88%    Wt Readings from Last 3 Encounters:  12/26/22 81.2 kg  12/23/22 81.2 kg  10/28/22 83.9 kg     Intake/Output Summary (Last 24 hours) at 12/28/2022 0941 Last data filed at 12/28/2022 0846 Gross per 24 hour  Intake 15.33 ml  Output 1000 ml  Net -984.67 ml     Physical Exam  Awake Alert, No new F.N deficits, Normal affect [place].AT,PERRAL Supple Neck, No JVD,   Symmetrical Chest wall movement, Good air movement bilaterally, coarse bilateral breath sounds with some bibasilar crackles RRR,No Gallops,Rubs or new Murmurs,  +ve B.Sounds, Abd Soft, No tenderness,   No Cyanosis, Clubbing or edema     Data Review:    Recent Labs  Lab 12/24/22 0953 12/26/22 1731 12/28/22 0443  WBC 11.8* 15.6* 20.9*  HGB 14.8 14.7 14.2  HCT 43.0 42.7 39.8  PLT 245 278 266  MCV 90.7 90.1 88.4  MCH 31.2 31.0 31.6  MCHC 34.4 34.4 35.7  RDW 13.1 13.2 12.9  LYMPHSABS 2.9 1.4  --   MONOABS 1.4* 1.1*  --   EOSABS 0.0 0.0  --   BASOSABS 0.0 0.0  --     Recent Labs  Lab 12/26/22 1731 12/28/22 0439 12/28/22 0443  NA 132*  --  128*  K 4.8  --  4.0  CL 95*  --  92*  CO2 25  --  25  ANIONGAP 12  --  11  GLUCOSE 129*  --  98  BUN 18  --  13  CREATININE 0.94  --  0.88  AST 30  --   --   ALT 24  --   --   ALKPHOS 59  --   --   BILITOT 0.3  --   --   ALBUMIN 4.6  --   --   BNP  --  136.5*  --   MG  --  1.9  --   CALCIUM 9.7  --  8.4*      Radiology Reports DG Chest Port 1 View  Result Date: 12/28/2022 CLINICAL DATA:  82 year old male with left rib pain after fall. EXAM: PORTABLE CHEST 1 VIEW COMPARISON:  Portable chest 12/27/2022 and earlier. FINDINGS: Portable AP semi upright view at 0652 hours. Mildly lower lung volumes. Mediastinal contours are stable and within normal limits. Visualized tracheal air column is within  normal limits. Crowding of lung markings, underlying coarse chronic pulmonary interstitium. No pneumothorax, pleural effusion or consolidation. Osteopenic ribs. No acute osseous abnormality identified. Paucity of bowel gas in the upper abdomen. IMPRESSION: Lower lung volumes and chronic pulmonary interstitial changes. No acute cardiopulmonary abnormality or acute traumatic injury identified. Electronically Signed   By: Genevie Ann M.D.   On: 12/28/2022 07:30   DG Chest Port 1 View  Result Date: 12/27/2022 CLINICAL DATA:  Shortness of breath.  RSV EXAM: PORTABLE CHEST 1 VIEW COMPARISON:  Yesterday FINDINGS: Normal heart size and stable mediastinal contours. Stable interstitial coarsening. 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 No effusion or air bronchogram. IMPRESSION: Stable from prior.  No focal pneumonia. Electronically Signed   By: Jorje Guild M.D.   On: 12/27/2022 04:07   DG Chest 2 View  Result Date: 12/26/2022 CLINICAL DATA:  Shortness of breath EXAM: CHEST - 2 VIEW COMPARISON:  Chest x-ray 12/07/2020.  CT chest 08/17/2021. 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 There is no new lung infiltrate, pleural effusion or pneumothorax. The cardiomediastinal silhouette is within normal limits. No acute fractures are identified. IMPRESSION: No active cardiopulmonary disease. Electronically Signed   By: Ronney Asters M.D.   On: 12/26/2022 18:24      Signature  -   Lala Lund M.D on 12/28/2022 at 9:41 AM   -  To page go to [email or web address]

## 2022-12-29 ENCOUNTER — Inpatient Hospital Stay (HOSPITAL_COMMUNITY): Payer: Medicare Other

## 2022-12-29 DIAGNOSIS — I480 Paroxysmal atrial fibrillation: Secondary | ICD-10-CM | POA: Diagnosis not present

## 2022-12-29 DIAGNOSIS — I272 Pulmonary hypertension, unspecified: Secondary | ICD-10-CM | POA: Diagnosis not present

## 2022-12-29 DIAGNOSIS — B338 Other specified viral diseases: Secondary | ICD-10-CM | POA: Diagnosis not present

## 2022-12-29 DIAGNOSIS — E871 Hypo-osmolality and hyponatremia: Secondary | ICD-10-CM

## 2022-12-29 DIAGNOSIS — I10 Essential (primary) hypertension: Secondary | ICD-10-CM

## 2022-12-29 LAB — CBC WITH DIFFERENTIAL/PLATELET
Abs Immature Granulocytes: 0.12 10*3/uL — ABNORMAL HIGH (ref 0.00–0.07)
Basophils Absolute: 0 10*3/uL (ref 0.0–0.1)
Basophils Relative: 0 %
Eosinophils Absolute: 0 10*3/uL (ref 0.0–0.5)
Eosinophils Relative: 0 %
HCT: 38.7 % — ABNORMAL LOW (ref 39.0–52.0)
Hemoglobin: 13.9 g/dL (ref 13.0–17.0)
Immature Granulocytes: 1 %
Lymphocytes Relative: 11 %
Lymphs Abs: 1.8 10*3/uL (ref 0.7–4.0)
MCH: 32.3 pg (ref 26.0–34.0)
MCHC: 35.9 g/dL (ref 30.0–36.0)
MCV: 90 fL (ref 80.0–100.0)
Monocytes Absolute: 1.7 10*3/uL — ABNORMAL HIGH (ref 0.1–1.0)
Monocytes Relative: 10 %
Neutro Abs: 13.1 10*3/uL — ABNORMAL HIGH (ref 1.7–7.7)
Neutrophils Relative %: 78 %
Platelets: 283 10*3/uL (ref 150–400)
RBC: 4.3 MIL/uL (ref 4.22–5.81)
RDW: 13 % (ref 11.5–15.5)
WBC: 16.7 10*3/uL — ABNORMAL HIGH (ref 4.0–10.5)
nRBC: 0 % (ref 0.0–0.2)

## 2022-12-29 LAB — BASIC METABOLIC PANEL
Anion gap: 10 (ref 5–15)
BUN: 24 mg/dL — ABNORMAL HIGH (ref 8–23)
CO2: 25 mmol/L (ref 22–32)
Calcium: 8.8 mg/dL — ABNORMAL LOW (ref 8.9–10.3)
Chloride: 90 mmol/L — ABNORMAL LOW (ref 98–111)
Creatinine, Ser: 1.02 mg/dL (ref 0.61–1.24)
GFR, Estimated: >60 mL/min (ref >60)
Glucose, Bld: 135 mg/dL — ABNORMAL HIGH (ref 70–99)
Potassium: 4.2 mmol/L (ref 3.5–5.1)
Sodium: 125 mmol/L — ABNORMAL LOW (ref 135–145)

## 2022-12-29 LAB — BRAIN NATRIURETIC PEPTIDE: B Natriuretic Peptide: 46.4 pg/mL (ref 0.0–100.0)

## 2022-12-29 LAB — ECHOCARDIOGRAM COMPLETE
Area-P 1/2: 4.06 cm2
S' Lateral: 2.9 cm

## 2022-12-29 LAB — PROCALCITONIN: Procalcitonin: <0.1 ng/mL

## 2022-12-29 LAB — C-REACTIVE PROTEIN: CRP: 15.3 mg/dL — ABNORMAL HIGH (ref <1.0)

## 2022-12-29 LAB — OSMOLALITY: Osmolality: 272 mOsm/kg — ABNORMAL LOW (ref 275–295)

## 2022-12-29 LAB — MAGNESIUM: Magnesium: 2.3 mg/dL (ref 1.7–2.4)

## 2022-12-29 LAB — OSMOLALITY, URINE: Osmolality, Ur: 632 mOsm/kg (ref 300–900)

## 2022-12-29 MED ORDER — MAGNESIUM CITRATE PO SOLN
0.5000 | Freq: Once | ORAL | Status: AC
  Administered 2022-12-29: 0.5 via ORAL
  Filled 2022-12-29: qty 296

## 2022-12-29 MED ORDER — SENNOSIDES-DOCUSATE SODIUM 8.6-50 MG PO TABS
1.0000 | ORAL_TABLET | Freq: Every day | ORAL | Status: DC
  Administered 2022-12-29 – 2023-01-03 (×6): 1 via ORAL
  Filled 2022-12-29 (×6): qty 1

## 2022-12-29 MED ORDER — ALBUTEROL SULFATE (2.5 MG/3ML) 0.083% IN NEBU: 2.5000 mg | INHALATION_SOLUTION | Freq: Four times a day (QID) | RESPIRATORY_TRACT | Status: DC | PRN

## 2022-12-29 NOTE — Progress Notes (Signed)
PROGRESS NOTE                                                                                                                                                                                                             Patient Demographics:    Gregory [name], is a 82 y.o. male, DOB - [birth year], [id number]:[id number]  Outpatient Primary MD for the patient is Early, [name], NP    LOS - 1  Admit date - 12/27/2022    Chief Complaint  Patient presents with   Shortness of Breath       Brief Narrative (HPI from H&P)  82 y.o. male with medical history significant of BPH, HLD, hereditary telangiectasias, hypothyroidism, aflutter, hyponatremia, and ILD presenting with SOB for 5-6 days, had outpt Z pack, oral Steroids without benefit, came to the ER where he was diagnosed with acute hypoxic respiratory failure due to RSV pneumonia/bronchitis, he was placed on a Venturi mask as he cannot tolerate nasal cannula oxygen due to hereditary telangiectasias inside his nostrils.  He was admitted to the hospital for further care.   Subjective:    Gregory [name] today denies any headache, fever, chills, still reports cough, and dyspnea.     Assessment  & Plan :   Moderate to severe acute hypoxic respiratory failure due to RSV pneumonia/bronchitis in a patient with underlying ILD.  He is currently on Venturi mask and satting in low 90s to high 80s, does have coarse breath sounds with some crackles, he also has some orthopnea.  BNP is mildly elevated.  He has failed outpatient oral steroids and Z-Pak, he will be placed on IV steroids along with IV Lasix, encouraged to sit up in chair use I-S and flutter valve for pulmonary toiletry.  Continue supplemental oxygen.  Will involve pulmonary critical care as well.  Overall quite tenuous right now. -CCM input greatly appreciated. -Continue with IV Solu-Medrol 60 mg daily. -CT chest without contrast when  respiratory status improves. -To evaluate for pulmonary hypertension  Hyponatremia.   Acute on chronic.  Clinically has elevated BNP with orthopnea. -Sodium has was worsened today, urine sodium is 144, but this is inaccurate as he received  Lasix yesterday -will Keep on fluid restriction 1200 cc for now.    Hypothyroidism.  On Synthroid.  BPH.  On Flomax.  GERD.  On PPI.  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  Currently no acute issues.  Paroxysmal atrial flutter.  Had an episode which was isolated in the past, not on anticoagulation due to above, monitor.  Not on rate control medications.  Hypertension.  On ARB.  Monitor and adjust.       Condition - Extremely Guarded  Family Communication  :  wife at bedside  Code Status :  Full  Consults  :  PCCM  PUD Prophylaxis : PPI   Procedures  :            Disposition Plan  :    Status is: inpatient  DVT Prophylaxis  :     SCDs Start: 12/27/22 1630    Lab Results  Component Value Date   PLT 283 12/29/2022    Diet :  Diet Order             Diet regular Room service appropriate? Yes; Fluid consistency: Thin  Diet effective now                    Inpatient Medications  Scheduled Meds:  arformoterol  15 mcg Nebulization BID   budesonide (PULMICORT) nebulizer solution  0.25 mg Nebulization BID   docusate sodium  100 mg Oral BID   feeding supplement  237 mL Oral BID BM   gabapentin  100 mg Oral BID   ipratropium-albuterol  3 mL Nebulization Q6H   irbesartan  37.5 mg Oral Daily   levothyroxine  150 mcg Oral Q0600   loratadine  10 mg Oral QPM   methylPREDNISolone (SOLU-MEDROL) injection  60 mg Intravenous Q24H   montelukast  10 mg Oral QHS   multivitamin with minerals  1 tablet Oral Daily   pantoprazole  40 mg Oral Daily   senna-docusate  1 tablet Oral QHS   sodium chloride flush   3 mL Intravenous Q12H   tamsulosin  0.4 mg Oral QPC supper   Continuous Infusions: PRN Meds:.acetaminophen **OR** acetaminophen, albuterol, benzonatate, bisacodyl, clorazepate, fluorometholone, hydrALAZINE, HYDROcodone bit-homatropine, methocarbamol, ondansetron **OR** ondansetron (ZOFRAN) IV, polyethylene glycol  Antibiotics  :    Anti-infectives (From admission, onward)    None         Objective:   Vitals:   12/29/22 0104 12/29/22 0309 12/29/22 0452 12/29/22 0900  BP:  123/61  (!) 151/68  Pulse:  67  90  Resp:  16  20  Temp:  98.2 F (36.8 C)  98.4 F (36.9 C)  TempSrc:  Axillary  Oral  SpO2: 96% 97% 98%     Wt Readings from Last 3 Encounters:  12/26/22 81.2 kg  12/23/22 81.2 kg  10/28/22 83.9 kg     Intake/Output Summary (Last 24 hours) at 12/29/2022 1331 Last data filed at 12/29/2022 0615 Gross per 24 hour  Intake --  Output 700 ml  Net -700 ml     Physical Exam  Awake Alert, Oriented X 3, No new F.N deficits, Normal affect Symmetrical Chest wall movement, coarse respiratory sounds bilaterally RRR,No Gallops,Rubs or new Murmurs, No Parasternal Heave +ve B.Sounds, Abd Soft, No tenderness, No rebound - guarding or rigidity. No Cyanosis, Clubbing or edema, No new Rash or bruise       Data Review:    Recent Labs  Lab 12/24/22 0953 12/26/22 1731 12/28/22 0443 12/29/22 0341  WBC 11.8* 15.6* 20.9* 16.7*  HGB 14.8 14.7 14.2  13.9  HCT 43.0 42.7 39.8 38.7*  PLT 245 278 266 283  MCV 90.7 90.1 88.4 90.0  MCH 31.2 31.0 31.6 32.3  MCHC 34.4 34.4 35.7 35.9  RDW 13.1 13.2 12.9 13.0  LYMPHSABS 2.9 1.4  --  1.8  MONOABS 1.4* 1.1*  --  1.7*  EOSABS 0.0 0.0  --  0.0  BASOSABS 0.0 0.0  --  0.0    Recent Labs  Lab 12/26/22 1731 12/28/22 0439 12/28/22 0443 12/28/22 0858 12/29/22 0341  NA 132*  --  128*  --  125*  K 4.8  --  4.0  --  4.2  CL 95*  --  92*  --  90*  CO2 25  --  25  --  25  ANIONGAP 12  --  11  --  10  GLUCOSE 129*  --  98  --  135*   BUN 18  --  13  --  24*  CREATININE 0.94  --  0.88  --  1.02  AST 30  --   --   --   --   ALT 24  --   --   --   --   ALKPHOS 59  --   --   --   --   BILITOT 0.3  --   --   --   --   ALBUMIN 4.6  --   --   --   --   CRP  --   --   --  10.7* 15.3*  PROCALCITON  --  <0.10  --   --  <0.10  BNP  --  136.5*  --   --  46.4  MG  --  1.9  --   --  2.3  CALCIUM 9.7  --  8.4*  --  8.8*      Radiology Reports DG Chest Port 1 View  Result Date: 12/28/2022 CLINICAL DATA:  82 year old male with left rib pain after fall. EXAM: PORTABLE CHEST 1 VIEW COMPARISON:  Portable chest 12/27/2022 and earlier. FINDINGS: Portable AP semi upright view at 0652 hours. Mildly lower lung volumes. Mediastinal contours are stable and within normal limits. Visualized tracheal air column is within normal limits. Crowding of lung markings, underlying coarse chronic pulmonary interstitium. No pneumothorax, pleural effusion or consolidation. Osteopenic ribs. No acute osseous abnormality identified. Paucity of bowel gas in the upper abdomen. IMPRESSION: Lower lung volumes and chronic pulmonary interstitial changes. No acute cardiopulmonary abnormality or acute traumatic injury identified. Electronically Signed   By: Genevie Ann M.D.   On: 12/28/2022 07:30   DG Chest Port 1 View  Result Date: 12/27/2022 CLINICAL DATA:  Shortness of breath.  RSV EXAM: PORTABLE CHEST 1 VIEW COMPARISON:  Yesterday FINDINGS: Normal heart size and stable mediastinal contours. Stable interstitial coarsening. 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 No effusion or air bronchogram. IMPRESSION: Stable from prior.  No focal pneumonia. Electronically Signed   By: Jorje Guild M.D.   On: 12/27/2022 04:07   DG Chest 2 View  Result Date: 12/26/2022 CLINICAL DATA:  Shortness of breath EXAM: CHEST - 2 VIEW COMPARISON:  Chest x-ray 12/07/2020.  CT chest 08/17/2021. FINDINGS: Patient's known right lower lobe pulmonary AVM is  unchanged. There is no new lung infiltrate, pleural effusion or pneumothorax. The cardiomediastinal silhouette is within normal limits. No acute fractures are identified. IMPRESSION: No active cardiopulmonary disease. Electronically Signed   By: Ronney Asters M.D.   On:  12/26/2022 18:24      Signature  -   Emeline Gins Devony Mcgrady M.D on 12/29/2022 at 1:31 PM   -  To page go to [email or web address]

## 2022-12-29 NOTE — Telephone Encounter (Signed)
FYI Sir,  Called patient and he was wanting to give you and update that he is currently admitted in the hospital. I informed him that you are in clinic this week and not at the hospital. He verbalized understanding.   FYI

## 2022-12-29 NOTE — Progress Notes (Addendum)
Mobility Specialist Progress Note:    12/29/22 1100  Mobility  Activity Ambulated with assistance in hallway  Level of Assistance Contact guard assist, steadying assist  Assistive Device None  Distance Ambulated (ft) 250 ft  Activity Response Tolerated well  Mobility Referral Yes  $Mobility charge 1 Mobility   Pt was eager for mobility session. SpO2 was 93% on 13L Venturi Mask throughout. Near EOS pt c/o SOB and SpO2 dropped to 85%, returned with rest. Returned pt to chair in room with all needs met.   Royetta Crochet Mobility Specialist Please contact via Solicitor or  Rehab office at [phone]

## 2022-12-29 NOTE — Progress Notes (Addendum)
NAME:  JERRIE GULLO, MRN:  [id number], DOB:  Mar 19, 1941, LOS: 1 ADMISSION DATE:  12/27/2022, CONSULTATION DATE: 12/28/2022 REFERRING MD: [facility], CHIEF COMPLAINT: Proximal respiratory failure    History of Present Illness:  Mr. Redd is an 82 year old male who is followed by Dr. 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  He presents with increasing hypoxia positive for RSV now up to 50% facemask with O2 sats of 88 to 92%.  He has been treated with steroids diuretics and reports feeling better.  Due to his underlying interstitial lung disease pulmonary critical care was asked to evaluate.  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  There is some concern that he may have a "walking pneumonia..  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  WITHOUT HEMOPTYSIS   History of nonmelanoma skin cancer EXCISION SQUAMOUS CELL FROM HAND   Hypertrophy of prostate with urinary obstruction and other lower urinary tract symptoms (LUTS)    ILD (interstitial lung disease) (HCC)    Irritable bowel syndrome    Nocturnal leg cramps 06/11/2013   Plantar fascial fibromatosis    Pure hypercholesterolemia    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 rate 20, SpO2 98 %.    FiO2 (%):  [55 %-70 %] 70 %   Intake/Output Summary (Last 24 hours) at 12/29/2022 1128 Last data filed at 12/29/2022 0615 Gross per 24 hour  Intake --  Output 700 ml  Net -700 ml   There were no vitals filed for this visit.  Examination: General: Well-nourished well-developed male sitting in chair no acute distress HEENT: MM pink/moist no JVD is appreciated Neuro: Grossly intact without focal defect CV: Heart sounds are regular PULM: Increased Currently on 13 L nasal cannula with 94% O2 saturation  GI: soft, bsx4 active  GU: Amber urine Extremities: warm/dry, decreased lower extremity edema  Skin: no rashes or lesions -700 cc intake and output  Resolved Hospital Problem list     Assessment & Plan:  Acute on chronic hypoxic respiratory failure in setting of ILD positive for RSV. Improved with diuretics Improved with steroids Wean O2 as tolerated currently on 13 L nasal cannula Continue cough suppressant    History of hypertension Continue antihypertensives  History of hypothyroidism Continue thyroid supplementation  Anxiety Continue anxiolytics  Best Practice (right click and "Reselect all SmartList Selections" daily)   Diet/type: Regular consistency (see orders) DVT prophylaxis:  GI prophylaxis: PPI Lines: N/A Foley:  N/A Code Status:  full code Last date of multidisciplinary goals of care discussion tbd  March 20 admit goal needing more initiate run out of patient  Labs   CBC: Recent Labs  Lab 12/24/22 0953 12/26/22 1731 12/28/22 0443 12/29/22 0341  WBC 11.8* 15.6* 20.9* 16.7*  NEUTROABS 7.4 13.1*  --  13.1*  HGB 14.8 14.7 14.2 13.9  HCT 43.0 42.7 39.8 38.7*  MCV 90.7 90.1 88.4 90.0  PLT 245 278 266 103    Basic Metabolic  Panel: Recent Labs  Lab 12/26/22 1731 12/28/22 0439 12/28/22 0443 12/29/22 0341  NA 132*  --  128* 125*  K 4.8  --  4.0 4.2  CL 95*  --  92* 90*  CO2 25  --  25 25  GLUCOSE 129*  --  98 135*  BUN 18  --  13 24*  CREATININE 0.94  --  0.88 1.02  CALCIUM 9.7  --  8.4* 8.8*  MG  --  1.9  --  2.3   GFR: Estimated Creatinine Clearance: 59.6 mL/min (by C-G formula based on SCr of 1.02 mg/dL). Recent Labs  Lab 12/24/22 0953 12/26/22 1731 12/28/22 0439 12/28/22 0443 12/29/22 0341  PROCALCITON  --   --  <0.10  --  <0.10  WBC 11.8* 15.6*  --  20.9* 16.7*    Liver Function Tests: Recent Labs  Lab 12/26/22 1731  AST 30  ALT 24  ALKPHOS 59  BILITOT 0.3  PROT 7.5  ALBUMIN 4.6   No results for input(s): "LIPASE", "AMYLASE" in the last 168 hours. No results for input(s): "AMMONIA" in the last 168 hours.  ABG No results found for: "PHART", "PCO2ART", "PO2ART", "HCO3", "TCO2", "ACIDBASEDEF", "O2SAT"   Coagulation Profile: No results for input(s): "INR", "PROTIME" in the last 168 hours.  Cardiac Enzymes: No results for input(s): "CKTOTAL", "CKMB", "CKMBINDEX", "TROPONINI" in the last 168 hours.  HbA1C: Hgb A1c MFr Bld  Date/Time Value Ref Range Status  10/26/2021 01:36 PM 5.2 4.6 - 6.5 % Final    Comment:    Glycemic Control Guidelines for People with Diabetes:Non Diabetic:  <6%Goal of Therapy: <7%Additional Action Suggested:  >8%   10/23/2020 10:50 AM 6.3 4.6 - 6.5 % Final    Comment:    Glycemic Control Guidelines for People with Diabetes:Non Diabetic:  <6%Goal of Therapy: <7%Additional Action Suggested:  >8%     CBG: No results for input(s): "GLUCAP" in the last 168 hours.    Richardson Landry Taisia Fantini ACNP Acute Care Nurse Practitioner [place] Please consult Amion 12/29/2022, 11:28 AM

## 2022-12-30 DIAGNOSIS — I780 Hereditary hemorrhagic telangiectasia: Secondary | ICD-10-CM | POA: Diagnosis not present

## 2022-12-30 DIAGNOSIS — J9601 Acute respiratory failure with hypoxia: Secondary | ICD-10-CM

## 2022-12-30 DIAGNOSIS — B338 Other specified viral diseases: Secondary | ICD-10-CM | POA: Diagnosis not present

## 2022-12-30 DIAGNOSIS — I480 Paroxysmal atrial fibrillation: Secondary | ICD-10-CM | POA: Diagnosis not present

## 2022-12-30 LAB — BRAIN NATRIURETIC PEPTIDE: B Natriuretic Peptide: 37.1 pg/mL (ref 0.0–100.0)

## 2022-12-30 LAB — CBC WITH DIFFERENTIAL/PLATELET
Abs Immature Granulocytes: 0.15 10*3/uL — ABNORMAL HIGH (ref 0.00–0.07)
Basophils Absolute: 0 10*3/uL (ref 0.0–0.1)
Basophils Relative: 0 %
Eosinophils Absolute: 0 10*3/uL (ref 0.0–0.5)
Eosinophils Relative: 0 %
HCT: 38.2 % — ABNORMAL LOW (ref 39.0–52.0)
Hemoglobin: 13.1 g/dL (ref 13.0–17.0)
Immature Granulocytes: 1 %
Lymphocytes Relative: 21 %
Lymphs Abs: 3.3 10*3/uL (ref 0.7–4.0)
MCH: 30.8 pg (ref 26.0–34.0)
MCHC: 34.3 g/dL (ref 30.0–36.0)
MCV: 89.9 fL (ref 80.0–100.0)
Monocytes Absolute: 1.8 10*3/uL — ABNORMAL HIGH (ref 0.1–1.0)
Monocytes Relative: 12 %
Neutro Abs: 10.4 10*3/uL — ABNORMAL HIGH (ref 1.7–7.7)
Neutrophils Relative %: 66 %
Platelets: 310 10*3/uL (ref 150–400)
RBC: 4.25 MIL/uL (ref 4.22–5.81)
RDW: 12.8 % (ref 11.5–15.5)
WBC: 15.7 10*3/uL — ABNORMAL HIGH (ref 4.0–10.5)
nRBC: 0 % (ref 0.0–0.2)

## 2022-12-30 LAB — BASIC METABOLIC PANEL
Anion gap: 9 (ref 5–15)
BUN: 22 mg/dL (ref 8–23)
CO2: 26 mmol/L (ref 22–32)
Calcium: 8.4 mg/dL — ABNORMAL LOW (ref 8.9–10.3)
Chloride: 95 mmol/L — ABNORMAL LOW (ref 98–111)
Creatinine, Ser: 0.9 mg/dL (ref 0.61–1.24)
GFR, Estimated: >60 mL/min (ref >60)
Glucose, Bld: 90 mg/dL (ref 70–99)
Potassium: 4.4 mmol/L (ref 3.5–5.1)
Sodium: 130 mmol/L — ABNORMAL LOW (ref 135–145)

## 2022-12-30 LAB — C-REACTIVE PROTEIN: CRP: 8.3 mg/dL — ABNORMAL HIGH (ref <1.0)

## 2022-12-30 LAB — MAGNESIUM: Magnesium: 2 mg/dL (ref 1.7–2.4)

## 2022-12-30 LAB — PROCALCITONIN: Procalcitonin: <0.1 ng/mL

## 2022-12-30 MED ORDER — ORAL CARE MOUTH RINSE: 15.0000 mL | OROMUCOSAL | Status: DC | PRN

## 2022-12-30 NOTE — Progress Notes (Signed)
Occupational Therapy Treatment Patient Details Name: Gregory [name] MRN: [id number] DOB: 1941/07/05 Today's Date: 12/30/2022   History of present illness 82 yo male presents to [facility] on 1/7 with ShOB requiring supplemental O2, d/ced and returned on 1/8 for continued O2 desaturation. + RSV. PMH includes  BPH, HLD, hereditary telangiectasias, hypothyroidism, aflutter, and ILD.   OT comments  Patient with good progress toward goals.  Remains on venturi mask, but is now down to 8L and 40% FIO2.  He is needing generalized supervision and therapeutic rest breaks to maintain O2 sats, but needs no physical assist.  OT can continue efforts in the acute setting for an additional visit or two, but he should have no problems transitioning home once cleared medically.     Recommendations for follow up therapy are one component of a multi-disciplinary discharge planning process, led by the attending physician.  Recommendations may be updated based on patient status, additional functional criteria and insurance authorization.    Follow Up Recommendations  No OT follow up     Assistance Recommended at Discharge Set up Supervision/Assistance  Patient can return home with the following  Assist for transportation   Equipment Recommendations  Tub/shower seat    Recommendations for Other Services      Precautions / Restrictions Precautions Precautions: Fall Precaution Comments: Watch O2 sats Restrictions Weight Bearing Restrictions: No       Mobility Bed Mobility               General bed mobility comments: up in recliner    Transfers Overall transfer level: Needs assistance Equipment used: None Transfers: Sit to/from Stand Sit to Stand: Supervision                 Balance Overall balance assessment: Mild deficits observed, not formally tested                                         ADL either performed or assessed with clinical judgement   ADL        Grooming: Wash/dry hands;Supervision/safety;Standing               Lower Body Dressing: Sit to/from stand;Supervision/safety   Toilet Transfer: Regular Toilet;Ambulation;Supervision/safety Toilet Transfer Details (indicate cue type and reason): line management                Extremity/Trunk Assessment Upper Extremity Assessment Upper Extremity Assessment: Overall WFL for tasks assessed   Lower Extremity Assessment Lower Extremity Assessment: Defer to PT evaluation   Cervical / Trunk Assessment Cervical / Trunk Assessment: Kyphotic    Vision Patient Visual Report: No change from baseline     Perception Perception Perception: Not tested   Praxis Praxis Praxis: Not tested    Cognition Arousal/Alertness: Awake/alert Behavior During Therapy: WFL for tasks assessed/performed Overall Cognitive Status: Within Functional Limits for tasks assessed                                          Exercises      Shoulder Instructions       General Comments  02 to 87%, rebounds with rest break.    Pertinent Vitals/ Pain       Pain Assessment Pain Assessment: No/denies pain Pain Intervention(s): Monitored during session  Frequency  Min 2X/week        Progress Toward Goals  OT Goals(current goals can now be found in the care plan section)  Progress towards OT goals: Progressing toward goals  Acute Rehab OT Goals OT Goal Formulation: With patient Time For Goal Achievement: 01/11/23 Potential to Achieve Goals: Good  Plan Discharge plan remains appropriate    Co-evaluation                 AM-PAC OT "6 Clicks" Daily Activity     Outcome Measure   Help from another person eating meals?: None Help from another person taking care of personal grooming?: A Little Help from another person toileting, which includes using toliet, bedpan, or urinal?: A  Little Help from another person bathing (including washing, rinsing, drying)?: A Little Help from another person to put on and taking off regular upper body clothing?: None Help from another person to put on and taking off regular lower body clothing?: A Little 6 Click Score: 20    End of Session Equipment Utilized During Treatment: Gait belt  OT Visit Diagnosis: Unsteadiness on feet (R26.81)   Activity Tolerance Patient tolerated treatment well   Patient Left in chair;with call bell/phone within reach;with family/visitor present   Nurse Communication Mobility status        Time: 1240-1300 OT Time Calculation (min): 20 min  Charges: OT General Charges $OT Visit: 1 Visit OT Treatments $Self Care/Home Management : 8-22 mins  12/30/2022  RP, OTR/L  Acute Rehabilitation Services  Office:  [phone]   Metta Clines 12/30/2022, 1:18 PM

## 2022-12-30 NOTE — Progress Notes (Signed)
PROGRESS NOTE                                                                                                                                                                                                             Patient Demographics:    Gregory [name], is a 82 y.o. male, DOB - 1941/01/10, EPP:[id number]  Outpatient Primary MD for the patient is Early, [name], NP    LOS - 2  Admit date - 12/27/2022    Chief Complaint  Patient presents with   Shortness of Breath       Brief Narrative (HPI from H&P)   82 y.o. male with medical history significant of BPH, HLD, hereditary telangiectasias, hypothyroidism, aflutter, hyponatremia, and ILD presenting with SOB for 5-6 days, had outpt Z pack, oral Steroids without benefit, came to the ER where he was diagnosed with acute hypoxic respiratory failure due to RSV pneumonia/bronchitis, he was placed on a Venturi mask as he cannot tolerate nasal cannula oxygen due to hereditary telangiectasias inside his nostrils.  He was admitted to the hospital for further care.   Subjective:    Gregory [name] today report good bowel movement this morning without taking magnesium citrate yesterday, report dyspnea at baseline.     Assessment  & Plan :   Moderate to severe acute hypoxic respiratory failure due to RSV pneumonia/bronchitis in a patient with underlying ILD.  -He is on room air at baseline. 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 -Continue with IV Solu-Medrol, will transition to oral regimen once stable, timing per pulmonary. -Will need CT chest with stable.  Hyponatremia.   Acute on chronic.  Clinically has elevated BNP with  orthopnea. -Proving with fluid restriction, will liberalize restriction from 1200 to 1800 cc today.   Hypothyroidism.  On Synthroid.  BPH.  On Flomax.  GERD.  On PPI.  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  Currently no acute issues.  Paroxysmal atrial flutter.  Had an episode which was isolated in the past, not  on anticoagulation due to above, monitor.  Not on rate control medications.  Hypertension.  On ARB.  Monitor and adjust.       Condition - Extremely Guarded  Family Communication  :  wife at bedside  Code Status :  Full  Consults  :  PCCM  PUD Prophylaxis : PPI   Procedures  :            Disposition Plan  :    Status is: inpatient  DVT Prophylaxis  :     SCDs Start: 12/27/22 1630    Lab Results  Component Value Date   PLT 310 12/30/2022    Diet :  Diet Order             Diet regular Room service appropriate? Yes; Fluid consistency: Thin; Fluid restriction: 1200 mL Fluid  Diet effective now                    Inpatient Medications  Scheduled Meds:  arformoterol  15 mcg Nebulization BID   budesonide (PULMICORT) nebulizer solution  0.25 mg Nebulization BID   docusate sodium  100 mg Oral BID   feeding supplement  237 mL Oral BID BM   gabapentin  100 mg Oral BID   irbesartan  37.5 mg Oral Daily   levothyroxine  150 mcg Oral Q0600   loratadine  10 mg Oral QPM   methylPREDNISolone (SOLU-MEDROL) injection  60 mg Intravenous Q24H   montelukast  10 mg Oral QHS   multivitamin with minerals  1 tablet Oral Daily   pantoprazole  40 mg Oral Daily   senna-docusate  1 tablet Oral QHS   sodium chloride flush  3 mL Intravenous Q12H   tamsulosin  0.4 mg Oral QPC supper   Continuous Infusions: PRN Meds:.acetaminophen **OR** acetaminophen, albuterol, benzonatate, bisacodyl, clorazepate, fluorometholone, hydrALAZINE, HYDROcodone  bit-homatropine, methocarbamol, ondansetron **OR** ondansetron (ZOFRAN) IV, mouth rinse, polyethylene glycol  Antibiotics  :    Anti-infectives (From admission, onward)    None         Objective:   Vitals:   12/30/22 0000 12/30/22 0300 12/30/22 0400 12/30/22 0800  BP: (!) 108/55 123/60 (!) 118/57 (!) 140/59  Pulse: 71 63 63 85  Resp:  16  18  Temp:  97.6 F (36.4 C)    TempSrc:  Axillary  Axillary  SpO2: 91% 90% 93%     Wt Readings from Last 3 Encounters:  12/26/22 81.2 kg  12/23/22 81.2 kg  10/28/22 83.9 kg     Intake/Output Summary (Last 24 hours) at 12/30/2022 1408 Last data filed at 12/30/2022 0400 Gross per 24 hour  Intake 123 ml  Output 850 ml  Net -727 ml     Physical Exam  Awake Alert, Oriented X 3, No new F.N deficits, Normal affect Symmetrical Chest wall movement, coarse respiratory sounds bilaterally RRR,No Gallops,Rubs or new Murmurs, No Parasternal Heave +ve B.Sounds, Abd Soft, No tenderness, No rebound - guarding or rigidity. No Cyanosis, Clubbing or edema, No new Rash or bruise        Data Review:    Recent Labs  Lab 12/24/22 0953 12/26/22 1731 12/28/22 0443 12/29/22 0341 12/30/22 0548  WBC 11.8* 15.6* 20.9* 16.7* 15.7*  HGB 14.8 14.7 14.2 13.9 13.1  HCT 43.0 42.7 39.8 38.7* 38.2*  PLT 245 278 266 283 310  MCV 90.7 90.1 88.4 90.0 89.9  MCH 31.2 31.0 31.6 32.3 30.8  MCHC 34.4 34.4 35.7 35.9 34.3  RDW 13.1 13.2  12.9 13.0 12.8  LYMPHSABS 2.9 1.4  --  1.8 3.3  MONOABS 1.4* 1.1*  --  1.7* 1.8*  EOSABS 0.0 0.0  --  0.0 0.0  BASOSABS 0.0 0.0  --  0.0 0.0    Recent Labs  Lab 12/26/22 1731 12/28/22 0439 12/28/22 0443 12/28/22 0858 12/29/22 0341 12/30/22 0548  NA 132*  --  128*  --  125* 130*  K 4.8  --  4.0  --  4.2 4.4  CL 95*  --  92*  --  90* 95*  CO2 25  --  25  --  25 26  ANIONGAP 12  --  11  --  10 9  GLUCOSE 129*  --  98  --  135* 90  BUN 18  --  13  --  24* 22  CREATININE 0.94  --  0.88  --  1.02 0.90  AST 30  --    --   --   --   --   ALT 24  --   --   --   --   --   ALKPHOS 59  --   --   --   --   --   BILITOT 0.3  --   --   --   --   --   ALBUMIN 4.6  --   --   --   --   --   CRP  --   --   --  10.7* 15.3* 8.3*  PROCALCITON  --  <0.10  --   --  <0.10 <0.10  BNP  --  136.5*  --   --  46.4 37.1  MG  --  1.9  --   --  2.3 2.0  CALCIUM 9.7  --  8.4*  --  8.8* 8.4*      Radiology Reports ECHOCARDIOGRAM COMPLETE  Result Date: 12/29/2022    ECHOCARDIOGRAM REPORT   Patient Name:   Gregory [name] Date of Exam: 12/29/2022 Medical Rec #:  [id number]        Height:       70.0 in Accession #:    [id number]       Weight:       179.0 lb Date of Birth:  [birth year]       BSA:          1.991 m Patient Age:    18 years         BP:           138/70 mmHg Patient Gender: M                HR:           94 bpm. Exam Location:  Inpatient Procedure: 2D Echo, Cardiac Doppler, Color Doppler and 3D Echo Indications:    Pulmonary hypertension I27.2  History:        Patient has prior history of Echocardiogram examinations, most                 recent 06/04/2021. Risk Factors:Dyslipidemia. Interstitial lung                 diesase.  Sonographer:    Darlina Sicilian RDCS Referring Phys: [id number] [place]  1. Left ventricular ejection fraction, by estimation, is 55 to 60%. The left ventricle has normal function. The left ventricle has no regional wall motion abnormalities. There is mild left ventricular hypertrophy. Indeterminate diastolic filling due to E-A fusion.  2. Right ventricular systolic function is normal. The right ventricular size is normal. Tricuspid regurgitation signal is inadequate for assessing PA pressure.  3. The mitral valve is grossly normal. No evidence of mitral valve regurgitation. No evidence of mitral stenosis.  4. The aortic valve is tricuspid. Aortic valve regurgitation is not visualized. No aortic stenosis is present.  5. The inferior vena cava is normal in size with greater than 50% respiratory  variability, suggesting right atrial pressure of 3 mmHg. FINDINGS  Left Ventricle: Left ventricular ejection fraction, by estimation, is 55 to 60%. The left ventricle has normal function. The left ventricle has no regional wall motion abnormalities. The left ventricular internal cavity size was normal in size. There is  mild left ventricular hypertrophy. Indeterminate diastolic filling due to E-A fusion. Right Ventricle: The right ventricular size is normal. No increase in right ventricular wall thickness. Right ventricular systolic function is normal. Tricuspid regurgitation signal is inadequate for assessing PA pressure. Left Atrium: Left atrial size was normal in size. Right Atrium: Right atrial size was normal in size. Pericardium: There is no evidence of pericardial effusion. Mitral Valve: The mitral valve is grossly normal. No evidence of mitral valve regurgitation. No evidence of mitral valve stenosis. Tricuspid Valve: The tricuspid valve is grossly normal. Tricuspid valve regurgitation is not demonstrated. Aortic Valve: The aortic valve is tricuspid. Aortic valve regurgitation is not visualized. No aortic stenosis is present. Pulmonic Valve: The pulmonic valve was grossly normal. Pulmonic valve regurgitation is trivial. No evidence of pulmonic stenosis. Aorta: The aortic root is normal in size and structure. Venous: The inferior vena cava is normal in size with greater than 50% respiratory variability, suggesting right atrial pressure of 3 mmHg. IAS/Shunts: The atrial septum is grossly normal.  LEFT VENTRICLE PLAX 2D LVIDd:         4.85 cm   Diastology LVIDs:         2.90 cm   LV e' medial:    5.87 cm/s LV PW:         0.90 cm   LV E/e' medial:  11.2 LV IVS:        1.10 cm   LV e' lateral:   8.16 cm/s LVOT diam:     1.90 cm   LV E/e' lateral: 8.1 LV SV:         64 LV SV Index:   32 LVOT Area:     2.84 cm  RIGHT VENTRICLE RV S prime:     21.10 cm/s TAPSE (M-mode): 1.2 cm LEFT ATRIUM             Index         RIGHT ATRIUM           Index LA diam:        3.40 cm 1.71 cm/m   RA Area:     10.80 cm LA Vol (A2C):   36.4 ml 18.28 ml/m  RA Volume:   19.20 ml  9.64 ml/m LA Vol (A4C):   36.5 ml 18.33 ml/m LA Biplane Vol: 37.4 ml 18.78 ml/m  AORTIC VALVE LVOT Vmax:   113.00 cm/s LVOT Vmean:  73.900 cm/s LVOT VTI:    0.227 m  AORTA Ao Root diam: 3.40 cm MITRAL VALVE MV Area (PHT): 4.06 cm    SHUNTS MV Decel Time: 187 msec    Systemic VTI:  0.23 m MV E velocity: 66.00 cm/s  Systemic Diam: 1.90 cm MV A velocity: 99.90 cm/s MV E/A ratio:  0.66 Cherlynn Kaiser MD Electronically signed by Cherlynn Kaiser MD Signature Date/Time: 12/29/2022/5:12:13 PM    Final    DG Chest Port 1 View  Result Date: 12/28/2022 CLINICAL DATA:  82 year old male with left rib pain after fall. EXAM: PORTABLE CHEST 1 VIEW COMPARISON:  Portable chest 12/27/2022 and earlier. FINDINGS: Portable AP semi upright view at 0652 hours. Mildly lower lung volumes. Mediastinal contours are stable and within normal limits. Visualized tracheal air column is within normal limits. Crowding of lung markings, underlying coarse chronic pulmonary interstitium. No pneumothorax, pleural effusion or consolidation. Osteopenic ribs. No acute osseous abnormality identified. Paucity of bowel gas in the upper abdomen. IMPRESSION: Lower lung volumes and chronic pulmonary interstitial changes. No acute cardiopulmonary abnormality or acute traumatic injury identified. Electronically Signed   By: Genevie Ann M.D.   On: 12/28/2022 07:30   DG Chest Port 1 View  Result Date: 12/27/2022 CLINICAL DATA:  Shortness of breath.  RSV EXAM: PORTABLE CHEST 1 VIEW COMPARISON:  Yesterday FINDINGS: Normal heart size and stable mediastinal contours. Stable interstitial coarsening. 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 No effusion or air bronchogram. IMPRESSION: Stable from prior.  No focal pneumonia. Electronically Signed   By: Jorje Guild M.D.   On:  12/27/2022 04:07   DG Chest 2 View  Result Date: 12/26/2022 CLINICAL DATA:  Shortness of breath EXAM: CHEST - 2 VIEW COMPARISON:  Chest x-ray 12/07/2020.  CT chest 08/17/2021. 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 There is no new lung infiltrate, pleural effusion or pneumothorax. The cardiomediastinal silhouette is within normal limits. No acute fractures are identified. IMPRESSION: No active cardiopulmonary disease. Electronically Signed   By: Ronney Asters M.D.   On: 12/26/2022 18:24      Signature  -   Phillips Climes M.D on 12/30/2022 at 2:08 PM   -  To page go to [email or web address]

## 2022-12-30 NOTE — Plan of Care (Signed)
  Problem: Education: Goal: Knowledge of General Education information will improve Description: Including pain rating scale, medication(s)/side effects and non-pharmacologic comfort measures Outcome: Progressing   Problem: Clinical Measurements: Goal: Respiratory complications will improve Outcome: Progressing   Problem: Pain Managment: Goal: General experience of comfort will improve Outcome: Progressing   

## 2022-12-30 NOTE — Progress Notes (Signed)
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  He presents with increasing hypoxia positive for RSV now up to 50% facemask with O2 sats of 88 to 92%.  He has been treated with steroids diuretics and reports feeling better.  Due to his underlying interstitial lung disease pulmonary critical care was asked to evaluate.  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  There is some concern that he may have a "walking pneumonia..  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  WITHOUT HEMOPTYSIS   History of nonmelanoma skin cancer EXCISION SQUAMOUS CELL FROM HAND   Hypertrophy of prostate with urinary obstruction and other lower urinary tract symptoms (LUTS)    ILD (interstitial lung disease) (HCC)    Irritable bowel syndrome    Nocturnal leg cramps 06/11/2013   Plantar fascial fibromatosis    Pure hypercholesterolemia    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 Prefers venturi while sleeping  Objective   Blood pressure (!) 140/59, pulse 85, temperature 97.6 F (36.4 C), temperature source Axillary, resp. rate 18, SpO2 93 %.    FiO2 (%):  [40 %-55 %] 40 %   Intake/Output Summary (Last 24 hours) at 12/30/2022 1321 Last data filed at 12/30/2022 0400 Gross per 24 hour  Intake 123 ml  Output 850 ml  Net -727 ml   There were no vitals filed for this visit.  Physical Exam: General: Well-appearing, no acute distress HENT: Gregory [name], AT, OP clear, MMM Eyes: EOMI, no scleral icterus Respiratory: Diminished but clear to auscultation bilaterally.  No crackles, wheezing or rales Cardiovascular: RRR, -M/R/G, no JVD Extremities:-Edema,-tenderness Neuro: AAO x4, CNII-XII grossly intact Psych: Normal mood, normal affect  CBC and BMET reviewed. Improved leukocytosis Improved CRP WBC 15, slightly improved  Echo - overall normal. No evidence of [place]  Resolved Hospital Problem list     Assessment & Plan:   Acute hypoxemic respiratory failure 2/2 RSV - improving O2 requirements --Continue nebulizer therapy. Previously on ICS/LABA with pulmonary --Continue solumedrol 60 mg daily for now. Plan for taper when clinically improved. Consider starting taper in 48-72 hours --Goal SpO2 >88%. Wean HFNC --Supportive care with cough syrup, PT/OT, OOB --When stable CT chest without contrast will need to be ordered as well. Wait for O2 requirements to improve first  Addressed patient's and wife's questions and concerns Updated primary team  Care Time: 50 min  Gregory [name], M.D. [facility] Pulmonary/Critical Care Medicine 12/30/2022 1:21 PM   See Amion for personal pager For hours between 7 PM to 7 AM, please call Gregory [name] for urgent questions

## 2022-12-30 NOTE — Progress Notes (Signed)
Physical Therapy Treatment Patient Details Name: Gregory [name] MRN: [id number] DOB: 1941/06/25 Today's Date: 12/30/2022   History of Present Illness 82 yo male presents to [facility] on 1/7 with ShOB requiring supplemental O2, d/ced and returned on 1/8 for continued O2 desaturation. + RSV. PMH includes  BPH, HLD, hereditary telangiectasias, hypothyroidism, aflutter, and ILD.    PT Comments    Pt continues to progress with mobility during today's session, ambulating with min guard, with brief LOB with initial steps but pt able to self-correct. Pt with 2 standing rest breaks during ambulation 2/2 fatigue and desat to 86%, progressing to 90's with rest break and productive coughs. Pt educated on deep breathing and pursed lip techniques. Pt will continue to benefit from skilled acute PT at this time to progress mobility and endurance, discharge recommendations remain appropriate.    Recommendations for follow up therapy are one component of a multi-disciplinary discharge planning process, led by the attending physician.  Recommendations may be updated based on patient status, additional functional criteria and insurance authorization.  Follow Up Recommendations  Other (comment) (continue OPPT)     Assistance Recommended at Discharge PRN  Patient can return home with the following A little help with walking and/or transfers   Equipment Recommendations  None recommended by PT    Recommendations for Other Services       Precautions / Restrictions Precautions Precautions: Fall Precaution Comments: Watch O2 sats Restrictions Weight Bearing Restrictions: No     Mobility  Bed Mobility               General bed mobility comments: up in recliner    Transfers Overall transfer level: Needs assistance Equipment used: None Transfers: Sit to/from Stand Sit to Stand: Supervision                Ambulation/Gait Ambulation/Gait assistance: Min guard Gait Distance (Feet): 270 Feet  (with 2 standing rest breaks) Assistive device: None Gait Pattern/deviations: Drifts right/left, Trunk flexed, Step-through pattern Gait velocity: decreased     General Gait Details: small LOB upon first few steps but pt able to self-correct, mild path deviation for first half of ambulation trial.   Stairs             Wheelchair Mobility    Modified Rankin (Stroke Patients Only)       Balance Overall balance assessment: Needs assistance Sitting-balance support: No upper extremity supported Sitting balance-Leahy Scale: Good     Standing balance support: No upper extremity supported, During functional activity Standing balance-Leahy Scale: Fair Standing balance comment: initial LOB with first few steps but able to self-correct with min guard               High Level Balance Comments: mild impairment (weaving of gait, unsteadiness pt-recovered, increased time)            Cognition Arousal/Alertness: Awake/alert Behavior During Therapy: WFL for tasks assessed/performed Overall Cognitive Status: Within Functional Limits for tasks assessed                                          Exercises      General Comments        Pertinent Vitals/Pain Pain Assessment Pain Assessment: 0-10 Pain Score: 2  Pain Location: B hips Pain Descriptors / Indicators: Sore Pain Intervention(s): Monitored during session    Home Living  Prior Function            PT Goals (current goals can now be found in the care plan section) Acute Rehab PT Goals PT Goal Formulation: With patient Time For Goal Achievement: 01/11/23 Potential to Achieve Goals: Good Progress towards PT goals: Progressing toward goals    Frequency    Min 3X/week      PT Plan Current plan remains appropriate    Co-evaluation              AM-PAC PT "6 Clicks" Mobility   Outcome Measure  Help needed turning from your back to your side  while in a flat bed without using bedrails?: None Help needed moving from lying on your back to sitting on the side of a flat bed without using bedrails?: None Help needed moving to and from a bed to a chair (including a wheelchair)?: A Little Help needed standing up from a chair using your arms (e.g., wheelchair or bedside chair)?: A Little Help needed to walk in hospital room?: A Little Help needed climbing 3-5 steps with a railing? : A Little 6 Click Score: 20    End of Session Equipment Utilized During Treatment: Oxygen;Gait belt Activity Tolerance: Patient tolerated treatment well Patient left: in chair;with call bell/phone within reach;with family/visitor present Nurse Communication: Mobility status PT Visit Diagnosis: Other abnormalities of gait and mobility (R26.89)     Time: 6153-7943 PT Time Calculation (min) (ACUTE ONLY): 20 min  Charges:  $Therapeutic Activity: 8-22 mins                     Charlynne Cousins, PT DPT Acute Rehabilitation Services Office [phone]    Luvenia Heller 12/30/2022, 1:31 PM

## 2022-12-31 DIAGNOSIS — J9601 Acute respiratory failure with hypoxia: Secondary | ICD-10-CM | POA: Diagnosis not present

## 2022-12-31 DIAGNOSIS — B338 Other specified viral diseases: Secondary | ICD-10-CM | POA: Diagnosis not present

## 2022-12-31 DIAGNOSIS — I480 Paroxysmal atrial fibrillation: Secondary | ICD-10-CM | POA: Diagnosis not present

## 2022-12-31 LAB — BASIC METABOLIC PANEL
Anion gap: 10 (ref 5–15)
BUN: 18 mg/dL (ref 8–23)
CO2: 24 mmol/L (ref 22–32)
Calcium: 8.1 mg/dL — ABNORMAL LOW (ref 8.9–10.3)
Chloride: 93 mmol/L — ABNORMAL LOW (ref 98–111)
Creatinine, Ser: 0.8 mg/dL (ref 0.61–1.24)
GFR, Estimated: >60 mL/min (ref >60)
Glucose, Bld: 85 mg/dL (ref 70–99)
Potassium: 4.3 mmol/L (ref 3.5–5.1)
Sodium: 127 mmol/L — ABNORMAL LOW (ref 135–145)

## 2022-12-31 MED ORDER — PREDNISONE 20 MG PO TABS
40.0000 mg | ORAL_TABLET | Freq: Every day | ORAL | Status: DC
  Administered 2023-01-04: 40 mg via ORAL
  Filled 2022-12-31: qty 2

## 2022-12-31 MED ORDER — PREDNISONE 5 MG PO TABS: 30.0000 mg | ORAL_TABLET | Freq: Every day | ORAL | Status: DC

## 2022-12-31 MED ORDER — PREDNISONE 20 MG PO TABS: 20.0000 mg | ORAL_TABLET | Freq: Every day | ORAL | Status: DC

## 2022-12-31 MED ORDER — PREDNISONE 5 MG PO TABS: 10.0000 mg | ORAL_TABLET | Freq: Every day | ORAL | Status: DC

## 2022-12-31 MED ORDER — PREDNISONE 5 MG PO TABS
50.0000 mg | ORAL_TABLET | Freq: Every day | ORAL | Status: AC
  Administered 2023-01-01 – 2023-01-03 (×3): 50 mg via ORAL
  Filled 2022-12-31 (×3): qty 2

## 2022-12-31 NOTE — Progress Notes (Signed)
NAME:  Gregory [name], MRN:  [id number], DOB:  1941-08-31, LOS: 3 ADMISSION DATE:  12/27/2022, CONSULTATION DATE: 12/28/2022 REFERRING MD: [facility], CHIEF COMPLAINT: Proximal respiratory failure    History of Present Illness:  [name] is an 82 year old male who is followed by Dr. 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  He presents with increasing hypoxia positive for RSV now up to 50% facemask with O2 sats of 88 to 92%.  He has been treated with steroids diuretics and reports feeling better.  Due to his underlying interstitial lung disease pulmonary critical care was asked to evaluate.  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  There is some concern that he may have a "walking pneumonia..  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  WITHOUT HEMOPTYSIS   History of nonmelanoma skin cancer EXCISION SQUAMOUS CELL FROM HAND   Hypertrophy of prostate with urinary obstruction and other lower urinary tract symptoms (LUTS)    ILD (interstitial lung disease) (HCC)    Irritable bowel syndrome    Nocturnal leg cramps 06/11/2013   Plantar fascial fibromatosis    Pure hypercholesterolemia    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 rate 18, SpO2 90 %.    FiO2 (%):  [40 %] 40 %   Intake/Output Summary (Last 24 hours) at 12/31/2022 0837 Last data filed at 12/31/2022 0500 Gross per 24 hour  Intake 3 ml  Output 850 ml  Net -847 ml   There were no vitals filed for this visit.  Physical Exam: General: Well-nourished well-developed 82 year old male HEENT: MM pink/moist no JVD is present Neuro: Grossly intact without focal defect CV: Heart sounds are regular PULM: Decreased air movement throughout Currently on 50% facemask with sats of 88 to 90% GI: soft, bsx4 active  GU: Voids Extremities: warm/dry, 2+ edema  Skin: no rashes or lesions No chest x-ray  Intake/Output Summary (Last 24 hours) at 12/31/2022 0840 Last data filed at 12/31/2022 0500 Gross per 24 hour  Intake 3 ml  Output 850 ml  Net -847 ml    Resolved Hospital Problem list     Assessment & Plan:   Acute hypoxemic respiratory failure 2/2 RSV -requiring facemask at night with sats 88 to 90% Continue bronchodilators Continue steroids Out of bed as tolerated Follow-up CT scan of the chest will be needed Wife updated at the bedside I suspect he may require home oxygen at the time of discharge to home Continue negative now  Richardson Landry Prisha Hiley ACNP Acute Care Nurse Practitioner [place] Please consult Amion 12/31/2022, 8:39 AM

## 2022-12-31 NOTE — Plan of Care (Signed)

## 2022-12-31 NOTE — TOC Initial Note (Signed)
Transition of Care [facility]) - Initial/Assessment Note    Patient Details  Name: Gregory [name] MRN: [id number] Date of Birth: January 15, 1941  [facility] CM/SW Contact:    Verdell Carmine, RN Phone Number: 12/31/2022, 12:27 PM  Clinical Narrative:                  Patient presents with worsening hypoxia. He has a history of interstitial lung disease, followed by Dr Silas Flood  from pulmonary. He is currently not on home oxygen. He cannot use nasal cannula due to disease process. He is weaning as needed with simple mask.  But he may need home oxygen.   Cm will follow for needs, recommendations, and transitions of care Expected Discharge Plan: Home/Self Care Barriers to Discharge: Continued Medical Work up   Patient Goals and CMS Choice            Expected Discharge Plan and Services       Living arrangements for the past 2 months: Single Family Home                                      Prior Living Arrangements/Services Living arrangements for the past 2 months: Single Family Home Lives with:: Spouse Patient language and need for interpreter reviewed:: Yes        Need for Family Participation in Patient Care: Yes (Comment) Care giver support system in place?: Yes (comment)   Criminal Activity/Legal Involvement Pertinent to Current Situation/Hospitalization: No - Comment as needed  Activities of Daily Living Home Assistive Devices/Equipment: None ADL Screening (condition at time of admission) Is the patient deaf or have difficulty hearing?: Yes Does the patient have difficulty seeing, even when wearing glasses/contacts?: Yes Does the patient have difficulty concentrating, remembering, or making decisions?: No Does the patient have difficulty dressing or bathing?: No Does the patient have difficulty walking or climbing stairs?: No  Permission Sought/Granted                  Emotional Assessment       Orientation: : Oriented to Self,  Oriented to Place, Oriented to  Time, Oriented to Situation Alcohol / Substance Use: Not Applicable Psych Involvement: No (comment)  Admission diagnosis:  RSV (acute bronchiolitis due to respiratory syncytial virus) [J21.0] Acute respiratory failure with hypoxia ([place]) [J96.01] RSV infection [B33.8] Patient Active Problem List   Diagnosis Date Noted   RSV infection 12/27/2022   Essential hypertension 12/27/2022   PAF (paroxysmal atrial fibrillation) ([place]) 12/27/2022   Dyslipidemia 12/27/2022   Balance problem 10/28/2022   Pain in both lower extremities 10/28/2022   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 05/26/2022   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 ([place]) 81/85/6314   Interstitial lung disease ([place]) 04/26/2022   S/P TURP 04/06/2022   Chronic diastolic heart failure ([place]) 02/16/2022   Hemangioma 02/15/2022   History of malignant neoplasm of skin 02/15/2022   Lentigo 02/15/2022   Melanocytic nevi of trunk 02/15/2022   Other seborrheic keratosis 02/15/2022   Rosacea 02/15/2022   Urinary urgency 01/12/2022   Spondylolisthesis, lumbar region 12/15/2021   Trochanteric bursitis of left hip 12/15/2021   Nonrheumatic mitral valve regurgitation 03/19/2021   Osteoarthritis of acromioclavicular joint 11/30/2020   SBE (subacute bacterial endocarditis) prophylaxis candidate 10/23/2020   Atherosclerosis of aorta ([place]) 10/23/2020   Acute cough 10/23/2020   SIADH (syndrome of inappropriate ADH production) ([place]) 04/23/2020  Bilateral sensorineural hearing loss 02/22/2019   Laryngopharyngeal reflux (LPR) 01/29/2019   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   Seasonal allergic rhinitis due to pollen 06/27/2018   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[place])    Osteoarthritis 04/01/2015   Iron deficiency anemia due to chronic blood loss 10/08/2014   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 [place] [place]Phone: [phone]: [phone] [address] [address]Phone: [phone]: [address] [place]Phone: [phone]: [phone] [place]Phone: [phone]: [phone]    Social Determinants of Health (SDOH) Social History: SDOH Screenings   Depression ([phone]: Low Risk  (09/06/2022)  Financial Resource Strain: Low Risk  (11/24/2020)  Tobacco Use: Low Risk  (12/27/2022)   SDOH Interventions:     Readmission Risk Interventions     No data to display

## 2022-12-31 NOTE — Progress Notes (Signed)
Mobility Specialist Progress Note:   12/31/22 1600  Mobility  Activity Ambulated with assistance in hallway  Level of Assistance Standby assist, set-up cues, supervision of patient - no hands on  Assistive Device None  Distance Ambulated (ft) 300 ft  Activity Response Tolerated well  Mobility Referral Yes  $Mobility charge 1 Mobility    Pre Mobility: SpO2 94% 8LO2 [place] During Mobility: SpO2 90-94% 8LO2 [place]  Pt eager for mobility session, required no physical assistance throughout. SPO2 WFL on 8LO2 . Pt encouraged by improvement. Left in chair with all needs met. Will aim to see twice tomorrow.   Nelta Numbers Mobility Specialist Please contact via SecureChat or  Rehab office at [phone]

## 2022-12-31 NOTE — Progress Notes (Addendum)
PROGRESS NOTE                                                                                                                                                                                                             Patient Demographics:    Gregory [name], is a 82 y.o. male, DOB - 1941/10/23, VVO:[id number]  Outpatient Primary MD for the patient is Early, [name], NP    LOS - 3  Admit date - 12/27/2022    Chief Complaint  Patient presents with   Shortness of Breath       Brief Narrative (HPI from H&P)   82 y.o. male with medical history significant of BPH, HLD, hereditary telangiectasias, hypothyroidism, aflutter, hyponatremia, and ILD presenting with SOB for 5-6 days, had outpt Z pack, oral Steroids without benefit, came to the ER where he was diagnosed with acute hypoxic respiratory failure due to RSV pneumonia/bronchitis, he was placed on a Venturi mask as he cannot tolerate nasal cannula oxygen due to hereditary telangiectasias inside his nostrils.  He was admitted to the hospital for further care.   Subjective:    Shiv Shuey today orts cough and congestion much improved, denies any fever or chills.     Assessment  & Plan :   Moderate to severe acute hypoxic respiratory failure due to RSV pneumonia/bronchitis in a patient with underlying ILD.  -He is on room air at baseline.  This morning he is on 5 L nasal cannula. 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 -Continue with IV Solu-Medrol, transition to oral prednisone when improves  -Will need CT chest with stable, oxygen requirement improves -He will need home oxygen on discharge   Hyponatremia.    Acute on chronic.  Clinically has elevated BNP with orthopnea. -Proving with fluid restriction, will liberalize restriction from 1200 to 1800 cc today.   Hypothyroidism.  On Synthroid.  BPH.  On Flomax.  GERD.  On PPI.  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  Currently no acute issues.  Paroxysmal atrial flutter.  Had an episode which was isolated in the past, not on anticoagulation due to above, monitor.  Not on rate control medications.  Hypertension.  On ARB.  Monitor and adjust.       Condition - Extremely Guarded  Family Communication  :  wife at bedside  Code Status :  Full  Consults  :  PCCM  PUD Prophylaxis : PPI   Procedures  :            Disposition Plan  :    Status is: inpatient  DVT Prophylaxis  :     SCDs Start: 12/27/22 1630    Lab Results  Component Value Date   PLT 310 12/30/2022    Diet :  Diet Order             Diet regular Room service appropriate? Yes; Fluid consistency: Thin; Fluid restriction: 1800 mL Fluid  Diet effective now                    Inpatient Medications  Scheduled Meds:  arformoterol  15 mcg Nebulization BID   budesonide (PULMICORT) nebulizer solution  0.25 mg Nebulization BID   docusate sodium  100 mg Oral BID   feeding supplement  237 mL Oral BID BM   gabapentin  100 mg Oral BID   irbesartan  37.5 mg Oral Daily   levothyroxine  150 mcg Oral Q0600   loratadine  10 mg Oral QPM   montelukast  10 mg Oral QHS   multivitamin with minerals  1 tablet Oral Daily   pantoprazole  40 mg Oral Daily   [START ON 01/01/2023] predniSONE  50 mg Oral Q breakfast   Followed by   Derrill Memo ON 01/04/2023] predniSONE  40 mg Oral Q breakfast   Followed by   Derrill Memo ON 01/07/2023] predniSONE  30 mg Oral Q breakfast   Followed by   Derrill Memo ON 01/10/2023] predniSONE  20 mg Oral Q breakfast   Followed by   Derrill Memo  ON 01/13/2023] predniSONE  10 mg Oral Q breakfast   senna-docusate  1 tablet Oral QHS   sodium chloride flush  3 mL Intravenous Q12H   tamsulosin  0.4 mg Oral QPC supper   Continuous Infusions: PRN Meds:.acetaminophen **OR** acetaminophen, albuterol, benzonatate, bisacodyl, clorazepate, fluorometholone, hydrALAZINE, HYDROcodone bit-homatropine, methocarbamol, ondansetron **OR** ondansetron (ZOFRAN) IV, mouth rinse, polyethylene glycol  Antibiotics  :    Anti-infectives (From admission, onward)    None         Objective:   Vitals:   12/30/22 2052 12/31/22 0000 12/31/22 0831 12/31/22 1123  BP: (!) 151/67 (!) 116/56 133/60 (!) 129/52  Pulse: 85 66 76 84  Resp: '18 18 15 17  '$ Temp:    97.6 F (36.4 C)  TempSrc:    Oral  SpO2: 92% 90%  90%    Wt Readings from Last 3 Encounters:  12/26/22 81.2 kg  12/23/22 81.2 kg  10/28/22 83.9 kg     Intake/Output Summary (Last 24 hours) at 12/31/2022 1500 Last data filed at 12/31/2022 0500 Gross per 24 hour  Intake 3 ml  Output 850 ml  Net -847 ml     Physical Exam  Awake Alert, Oriented X 3, No new F.N deficits, Normal affect Symmetrical Chest wall movement, improved air entry, coarse respiratory sounds RRR,No Gallops,Rubs or new Murmurs, No Parasternal Heave +ve B.Sounds, Abd Soft, No tenderness, No rebound - guarding or rigidity. No Cyanosis, Clubbing or edema, No new  Rash or bruise        Data Review:    Recent Labs  Lab 12/26/22 1731 12/28/22 0443 12/29/22 0341 12/30/22 0548  WBC 15.6* 20.9* 16.7* 15.7*  HGB 14.7 14.2 13.9 13.1  HCT 42.7 39.8 38.7* 38.2*  PLT 278 266 283 310  MCV 90.1 88.4 90.0 89.9  MCH 31.0 31.6 32.3 30.8  MCHC 34.4 35.7 35.9 34.3  RDW 13.2 12.9 13.0 12.8  LYMPHSABS 1.4  --  1.8 3.3  MONOABS 1.1*  --  1.7* 1.8*  EOSABS 0.0  --  0.0 0.0  BASOSABS 0.0  --  0.0 0.0    Recent Labs  Lab 12/26/22 1731 12/28/22 0439 12/28/22 0443 12/28/22 0858 12/29/22 0341 12/30/22 0548 12/31/22 0610   NA 132*  --  128*  --  125* 130* 127*  K 4.8  --  4.0  --  4.2 4.4 4.3  CL 95*  --  92*  --  90* 95* 93*  CO2 25  --  25  --  '25 26 24  '$ ANIONGAP 12  --  11  --  '10 9 10  '$ GLUCOSE 129*  --  98  --  135* 90 85  BUN 18  --  13  --  24* 22 18  CREATININE 0.94  --  0.88  --  1.02 0.90 0.80  AST 30  --   --   --   --   --   --   ALT 24  --   --   --   --   --   --   ALKPHOS 59  --   --   --   --   --   --   BILITOT 0.3  --   --   --   --   --   --   ALBUMIN 4.6  --   --   --   --   --   --   CRP  --   --   --  10.7* 15.3* 8.3*  --   PROCALCITON  --  <0.10  --   --  <0.10 <0.10  --   BNP  --  136.5*  --   --  46.4 37.1  --   MG  --  1.9  --   --  2.3 2.0  --   CALCIUM 9.7  --  8.4*  --  8.8* 8.4* 8.1*      Radiology Reports ECHOCARDIOGRAM COMPLETE  Result Date: 12/29/2022    ECHOCARDIOGRAM REPORT   Patient Name:   LENG MONTESDEOCA Date of Exam: 12/29/2022 Medical Rec #:  [id number]        Height:       70.0 in Accession #:    [id number]       Weight:       179.0 lb Date of Birth:  [birth year]       BSA:          1.991 m Patient Age:    52 years         BP:           138/70 mmHg Patient Gender: M                HR:           94 bpm. Exam Location:  Inpatient Procedure: 2D Echo, Cardiac Doppler, Color Doppler and 3D Echo Indications:    Pulmonary hypertension I27.2  History:  Patient has prior history of Echocardiogram examinations, most                 recent 06/04/2021. Risk Factors:Dyslipidemia. Interstitial lung                 diesase.  Sonographer:    Darlina Sicilian RDCS Referring Phys: [id number]  1. Left ventricular ejection fraction, by estimation, is 55 to 60%. The left ventricle has normal function. The left ventricle has no regional wall motion abnormalities. There is mild left ventricular hypertrophy. Indeterminate diastolic filling due to E-A fusion.  2. Right ventricular systolic function is normal. The right ventricular size is normal. Tricuspid  regurgitation signal is inadequate for assessing PA pressure.  3. The mitral valve is grossly normal. No evidence of mitral valve regurgitation. No evidence of mitral stenosis.  4. The aortic valve is tricuspid. Aortic valve regurgitation is not visualized. No aortic stenosis is present.  5. The inferior vena cava is normal in size with greater than 50% respiratory variability, suggesting right atrial pressure of 3 mmHg. FINDINGS  Left Ventricle: Left ventricular ejection fraction, by estimation, is 55 to 60%. The left ventricle has normal function. The left ventricle has no regional wall motion abnormalities. The left ventricular internal cavity size was normal in size. There is  mild left ventricular hypertrophy. Indeterminate diastolic filling due to E-A fusion. Right Ventricle: The right ventricular size is normal. No increase in right ventricular wall thickness. Right ventricular systolic function is normal. Tricuspid regurgitation signal is inadequate for assessing PA pressure. Left Atrium: Left atrial size was normal in size. Right Atrium: Right atrial size was normal in size. Pericardium: There is no evidence of pericardial effusion. Mitral Valve: The mitral valve is grossly normal. No evidence of mitral valve regurgitation. No evidence of mitral valve stenosis. Tricuspid Valve: The tricuspid valve is grossly normal. Tricuspid valve regurgitation is not demonstrated. Aortic Valve: The aortic valve is tricuspid. Aortic valve regurgitation is not visualized. No aortic stenosis is present. Pulmonic Valve: The pulmonic valve was grossly normal. Pulmonic valve regurgitation is trivial. No evidence of pulmonic stenosis. Aorta: The aortic root is normal in size and structure. Venous: The inferior vena cava is normal in size with greater than 50% respiratory variability, suggesting right atrial pressure of 3 mmHg. IAS/Shunts: The atrial septum is grossly normal.  LEFT VENTRICLE PLAX 2D LVIDd:         4.85 cm    Diastology LVIDs:         2.90 cm   LV e' medial:    5.87 cm/s LV PW:         0.90 cm   LV E/e' medial:  11.2 LV IVS:        1.10 cm   LV e' lateral:   8.16 cm/s LVOT diam:     1.90 cm   LV E/e' lateral: 8.1 LV SV:         64 LV SV Index:   32 LVOT Area:     2.84 cm  RIGHT VENTRICLE RV S prime:     21.10 cm/s TAPSE (M-mode): 1.2 cm LEFT ATRIUM             Index        RIGHT ATRIUM           Index LA diam:        3.40 cm 1.71 cm/m   RA Area:     10.80 cm LA Vol (A2C):  36.4 ml 18.28 ml/m  RA Volume:   19.20 ml  9.64 ml/m LA Vol (A4C):   36.5 ml 18.33 ml/m LA Biplane Vol: 37.4 ml 18.78 ml/m  AORTIC VALVE LVOT Vmax:   113.00 cm/s LVOT Vmean:  73.900 cm/s LVOT VTI:    0.227 m  AORTA Ao Root diam: 3.40 cm MITRAL VALVE MV Area (PHT): 4.06 cm    SHUNTS MV Decel Time: 187 msec    Systemic VTI:  0.23 m MV E velocity: 66.00 cm/s  Systemic Diam: 1.90 cm MV A velocity: 99.90 cm/s MV E/A ratio:  0.66 Cherlynn Kaiser MD Electronically signed by Cherlynn Kaiser MD Signature Date/Time: 12/29/2022/5:12:13 PM    Final    DG Chest Port 1 View  Result Date: 12/28/2022 CLINICAL DATA:  82 year old male with left rib pain after fall. EXAM: PORTABLE CHEST 1 VIEW COMPARISON:  Portable chest 12/27/2022 and earlier. FINDINGS: Portable AP semi upright view at 0652 hours. Mildly lower lung volumes. Mediastinal contours are stable and within normal limits. Visualized tracheal air column is within normal limits. Crowding of lung markings, underlying coarse chronic pulmonary interstitium. No pneumothorax, pleural effusion or consolidation. Osteopenic ribs. No acute osseous abnormality identified. Paucity of bowel gas in the upper abdomen. IMPRESSION: Lower lung volumes and chronic pulmonary interstitial changes. No acute cardiopulmonary abnormality or acute traumatic injury identified. Electronically Signed   By: Genevie Ann M.D.   On: 12/28/2022 07:30      Signature  -   Emeline Gins Kaeson Kleinert M.D on 12/31/2022 at 3:00 PM   -  To page go to  [email or web address]

## 2023-01-01 DIAGNOSIS — J9601 Acute respiratory failure with hypoxia: Secondary | ICD-10-CM | POA: Diagnosis not present

## 2023-01-01 DIAGNOSIS — B338 Other specified viral diseases: Secondary | ICD-10-CM | POA: Diagnosis not present

## 2023-01-01 LAB — BASIC METABOLIC PANEL
Anion gap: 9 (ref 5–15)
BUN: 18 mg/dL (ref 8–23)
CO2: 25 mmol/L (ref 22–32)
Calcium: 8.5 mg/dL — ABNORMAL LOW (ref 8.9–10.3)
Chloride: 94 mmol/L — ABNORMAL LOW (ref 98–111)
Creatinine, Ser: 0.82 mg/dL (ref 0.61–1.24)
GFR, Estimated: >60 mL/min (ref >60)
Glucose, Bld: 106 mg/dL — ABNORMAL HIGH (ref 70–99)
Potassium: 4.4 mmol/L (ref 3.5–5.1)
Sodium: 128 mmol/L — ABNORMAL LOW (ref 135–145)

## 2023-01-01 MED ORDER — GUAIFENESIN ER 600 MG PO TB12
1200.0000 mg | ORAL_TABLET | Freq: Two times a day (BID) | ORAL | Status: DC
  Administered 2023-01-01 – 2023-01-04 (×7): 1200 mg via ORAL
  Filled 2023-01-01 (×7): qty 2

## 2023-01-01 NOTE — Progress Notes (Signed)
PROGRESS NOTE                                                                                                                                                                                                             Patient Demographics:    Gregory [name], is a 82 y.o. male, DOB - 1941-12-01, GLO:[id number]  Outpatient Primary MD for the patient is Early, [name], NP    LOS - 4  Admit date - 12/27/2022    Chief Complaint  Patient presents with   Shortness of Breath       Brief Narrative (HPI from H&P)   82 y.o. male with medical history significant of BPH, HLD, hereditary telangiectasias, hypothyroidism, aflutter, hyponatremia, and ILD presenting with SOB for 5-6 days, had outpt Z pack, oral Steroids without benefit, came to the ER where he was diagnosed with acute hypoxic respiratory failure due to RSV pneumonia/bronchitis, he was placed on a Venturi mask as he cannot tolerate nasal cannula oxygen due to hereditary telangiectasias inside his nostrils.  He was admitted to the hospital for further care.   Subjective:    Lukas Pelcher today ports he feels his cough is more productive, sputum is more thickened, he ambulated in the hallway earlier today.     Assessment  & Plan :   Moderate to severe acute hypoxic respiratory failure due to RSV pneumonia/bronchitis in a patient with underlying ILD.  -He is on room air at baseline.  This morning he is on 5 L nasal cannula. 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 -Continue with IV Solu-Medrol, transition to oral prednisone when improves  -Will need CT chest with stable, oxygen requirement improves -He will need home  oxygen on discharge -Add Mucinex today -Will need repeat CT chest, likely by Monday.   Hyponatremia.   Acute on chronic.  Clinically has elevated BNP with orthopnea. -improving with fluid restriction, will liberalize restriction from 1200 to 1800 cc today.   Hypothyroidism.  On Synthroid.  BPH.  On Flomax.  GERD.  On PPI.  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  Currently no acute issues.  Paroxysmal atrial flutter.  Had an episode which was isolated in the past, not on anticoagulation due to above, monitor.  Not on rate control medications.  Hypertension.  On ARB.  Monitor and adjust.       Condition - Extremely Guarded  Family Communication  :  wife at bedside  Code Status :  Full  Consults  :  PCCM  PUD Prophylaxis : PPI   Procedures  :            Disposition Plan  :    Status is: inpatient  DVT Prophylaxis  :     SCDs Start: 12/27/22 1630    Lab Results  Component Value Date   PLT 310 12/30/2022    Diet :  Diet Order             Diet regular Room service appropriate? Yes; Fluid consistency: Thin; Fluid restriction: 1800 mL Fluid  Diet effective now                    Inpatient Medications  Scheduled Meds:  arformoterol  15 mcg Nebulization BID   budesonide (PULMICORT) nebulizer solution  0.25 mg Nebulization BID   docusate sodium  100 mg Oral BID   feeding supplement  237 mL Oral BID BM   gabapentin  100 mg Oral BID   guaiFENesin  1,200 mg Oral BID   irbesartan  37.5 mg Oral Daily   levothyroxine  150 mcg Oral Q0600   loratadine  10 mg Oral QPM   montelukast  10 mg Oral QHS   multivitamin with minerals  1 tablet Oral Daily   pantoprazole  40 mg Oral Daily   predniSONE  50 mg Oral Q breakfast   Followed by   Derrill Memo ON 01/04/2023] predniSONE  40 mg Oral Q breakfast   Followed by   Derrill Memo ON 01/07/2023]  predniSONE  30 mg Oral Q breakfast   Followed by   Derrill Memo ON 01/10/2023] predniSONE  20 mg Oral Q breakfast   Followed by   Derrill Memo ON 01/13/2023] predniSONE  10 mg Oral Q breakfast   senna-docusate  1 tablet Oral QHS   sodium chloride flush  3 mL Intravenous Q12H   tamsulosin  0.4 mg Oral QPC supper   Continuous Infusions: PRN Meds:.acetaminophen **OR** acetaminophen, albuterol, benzonatate, bisacodyl, clorazepate, fluorometholone, hydrALAZINE, HYDROcodone bit-homatropine, methocarbamol, ondansetron **OR** ondansetron (ZOFRAN) IV, mouth rinse, polyethylene glycol  Antibiotics  :    Anti-infectives (From admission, onward)    None         Objective:   Vitals:   01/01/23 0048 01/01/23 0449 01/01/23 0823 01/01/23 1255  BP: (!) 145/64 (!) 146/71 (!) 142/56 (!) 143/58  Pulse: 72  90 82  Resp: '17 18 18 18  '$ Temp: 97.7 F (36.5 C) (!) 97.5 F (36.4 C) 97.6 F (36.4 C) 98 F (36.7 C)  TempSrc: Axillary Oral Axillary Axillary  SpO2: 92%  90% 92%    Wt Readings from Last 3 Encounters:  12/26/22 81.2 kg  12/23/22 81.2 kg  10/28/22 83.9 kg     Intake/Output Summary (Last 24 hours) at 01/01/2023 1349 Last data filed at 01/01/2023 1251 Gross per 24 hour  Intake --  Output 725 ml  Net -725 ml     Physical Exam  Awake Alert, Oriented X 3, No new F.N deficits, Normal affect Symmetrical Chest wall  movement, respiratory sound bilaterally RRR,No Gallops,Rubs or new Murmurs, No Parasternal Heave +ve B.Sounds, Abd Soft, No tenderness, No rebound - guarding or rigidity. No Cyanosis, Clubbing or edema, No new Rash or bruise         Data Review:    Recent Labs  Lab 12/26/22 1731 12/28/22 0443 12/29/22 0341 12/30/22 0548  WBC 15.6* 20.9* 16.7* 15.7*  HGB 14.7 14.2 13.9 13.1  HCT 42.7 39.8 38.7* 38.2*  PLT 278 266 283 310  MCV 90.1 88.4 90.0 89.9  MCH 31.0 31.6 32.3 30.8  MCHC 34.4 35.7 35.9 34.3  RDW 13.2 12.9 13.0 12.8  LYMPHSABS 1.4  --  1.8 3.3  MONOABS 1.1*  --   1.7* 1.8*  EOSABS 0.0  --  0.0 0.0  BASOSABS 0.0  --  0.0 0.0    Recent Labs  Lab 12/26/22 1731 12/28/22 0439 12/28/22 0443 12/28/22 0858 12/29/22 0341 12/30/22 0548 12/31/22 0610 01/01/23 0149  NA 132*  --  128*  --  125* 130* 127* 128*  K 4.8  --  4.0  --  4.2 4.4 4.3 4.4  CL 95*  --  92*  --  90* 95* 93* 94*  CO2 25  --  25  --  '25 26 24 25  '$ ANIONGAP 12  --  11  --  '10 9 10 9  '$ GLUCOSE 129*  --  98  --  135* 90 85 106*  BUN 18  --  13  --  24* '22 18 18  '$ CREATININE 0.94  --  0.88  --  1.02 0.90 0.80 0.82  AST 30  --   --   --   --   --   --   --   ALT 24  --   --   --   --   --   --   --   ALKPHOS 59  --   --   --   --   --   --   --   BILITOT 0.3  --   --   --   --   --   --   --   ALBUMIN 4.6  --   --   --   --   --   --   --   CRP  --   --   --  10.7* 15.3* 8.3*  --   --   PROCALCITON  --  <0.10  --   --  <0.10 <0.10  --   --   BNP  --  136.5*  --   --  46.4 37.1  --   --   MG  --  1.9  --   --  2.3 2.0  --   --   CALCIUM 9.7  --  8.4*  --  8.8* 8.4* 8.1* 8.5*      Radiology Reports ECHOCARDIOGRAM COMPLETE  Result Date: 12/29/2022    ECHOCARDIOGRAM REPORT   Patient Name:   LOEL BETANCUR Date of Exam: 12/29/2022 Medical Rec #:  [id number]        Height:       70.0 in Accession #:    [id number]       Weight:       179.0 lb Date of Birth:  [birth year]       BSA:          1.991 m Patient Age:    89 years  BP:           138/70 mmHg Patient Gender: M                HR:           94 bpm. Exam Location:  Inpatient Procedure: 2D Echo, Cardiac Doppler, Color Doppler and 3D Echo Indications:    Pulmonary hypertension I27.2  History:        Patient has prior history of Echocardiogram examinations, most                 recent 06/04/2021. Risk Factors:Dyslipidemia. Interstitial lung                 diesase.  Sonographer:    Darlina Sicilian RDCS Referring Phys: [id number] [place]  1. Left ventricular ejection fraction, by estimation, is 55 to 60%. The left  ventricle has normal function. The left ventricle has no regional wall motion abnormalities. There is mild left ventricular hypertrophy. Indeterminate diastolic filling due to E-A fusion.  2. Right ventricular systolic function is normal. The right ventricular size is normal. Tricuspid regurgitation signal is inadequate for assessing PA pressure.  3. The mitral valve is grossly normal. No evidence of mitral valve regurgitation. No evidence of mitral stenosis.  4. The aortic valve is tricuspid. Aortic valve regurgitation is not visualized. No aortic stenosis is present.  5. The inferior vena cava is normal in size with greater than 50% respiratory variability, suggesting right atrial pressure of 3 mmHg. FINDINGS  Left Ventricle: Left ventricular ejection fraction, by estimation, is 55 to 60%. The left ventricle has normal function. The left ventricle has no regional wall motion abnormalities. The left ventricular internal cavity size was normal in size. There is  mild left ventricular hypertrophy. Indeterminate diastolic filling due to E-A fusion. Right Ventricle: The right ventricular size is normal. No increase in right ventricular wall thickness. Right ventricular systolic function is normal. Tricuspid regurgitation signal is inadequate for assessing PA pressure. Left Atrium: Left atrial size was normal in size. Right Atrium: Right atrial size was normal in size. Pericardium: There is no evidence of pericardial effusion. Mitral Valve: The mitral valve is grossly normal. No evidence of mitral valve regurgitation. No evidence of mitral valve stenosis. Tricuspid Valve: The tricuspid valve is grossly normal. Tricuspid valve regurgitation is not demonstrated. Aortic Valve: The aortic valve is tricuspid. Aortic valve regurgitation is not visualized. No aortic stenosis is present. Pulmonic Valve: The pulmonic valve was grossly normal. Pulmonic valve regurgitation is trivial. No evidence of pulmonic stenosis. Aorta: The  aortic root is normal in size and structure. Venous: The inferior vena cava is normal in size with greater than 50% respiratory variability, suggesting right atrial pressure of 3 mmHg. IAS/Shunts: The atrial septum is grossly normal.  LEFT VENTRICLE PLAX 2D LVIDd:         4.85 cm   Diastology LVIDs:         2.90 cm   LV e' medial:    5.87 cm/s LV PW:         0.90 cm   LV E/e' medial:  11.2 LV IVS:        1.10 cm   LV e' lateral:   8.16 cm/s LVOT diam:     1.90 cm   LV E/e' lateral: 8.1 LV SV:         64 LV SV Index:   32 LVOT Area:     2.84 cm  RIGHT VENTRICLE  RV S prime:     21.10 cm/s TAPSE (M-mode): 1.2 cm LEFT ATRIUM             Index        RIGHT ATRIUM           Index LA diam:        3.40 cm 1.71 cm/m   RA Area:     10.80 cm LA Vol (A2C):   36.4 ml 18.28 ml/m  RA Volume:   19.20 ml  9.64 ml/m LA Vol (A4C):   36.5 ml 18.33 ml/m LA Biplane Vol: 37.4 ml 18.78 ml/m  AORTIC VALVE LVOT Vmax:   113.00 cm/s LVOT Vmean:  73.900 cm/s LVOT VTI:    0.227 m  AORTA Ao Root diam: 3.40 cm MITRAL VALVE MV Area (PHT): 4.06 cm    SHUNTS MV Decel Time: 187 msec    Systemic VTI:  0.23 m MV E velocity: 66.00 cm/s  Systemic Diam: 1.90 cm MV A velocity: 99.90 cm/s MV E/A ratio:  0.66 Cherlynn Kaiser MD Electronically signed by Cherlynn Kaiser MD Signature Date/Time: 12/29/2022/5:12:13 PM    Final       Signature  -   Phillips Climes M.D on 01/01/2023 at 1:49 PM   -  To page go to [email or web address]

## 2023-01-01 NOTE — Progress Notes (Signed)
Mobility Specialist Progress Note:   01/01/23 0940  Mobility  Activity Ambulated with assistance in hallway  Level of Assistance Standby assist, set-up cues, supervision of patient - no hands on  Assistive Device None  Distance Ambulated (ft) 300 ft  Activity Response Tolerated well  Mobility Referral Yes  $Mobility charge 1 Mobility   Pt eager for mobility session. Required no physical assistance throughout. SpO2 dropped to mid 80s on 6LO2, incr with 8LO2 (90%). Pt c/o mild SOB throughout with consistent cough when taking deep breaths. Left in chair with all needs met, back on 6LO2 via Simple Mask SpO2 92%.   Nelta Numbers Mobility Specialist Please contact via SecureChat or  Rehab office at [phone]

## 2023-01-01 NOTE — Progress Notes (Signed)
Pt was placed on simple mask 6L and is currently stable at this time. Pt unable to wear [place] due telanglectasias.

## 2023-01-02 DIAGNOSIS — B338 Other specified viral diseases: Secondary | ICD-10-CM | POA: Diagnosis not present

## 2023-01-02 DIAGNOSIS — E871 Hypo-osmolality and hyponatremia: Secondary | ICD-10-CM | POA: Diagnosis not present

## 2023-01-02 DIAGNOSIS — J9601 Acute respiratory failure with hypoxia: Secondary | ICD-10-CM | POA: Diagnosis not present

## 2023-01-02 LAB — BASIC METABOLIC PANEL
Anion gap: 7 (ref 5–15)
BUN: 14 mg/dL (ref 8–23)
CO2: 26 mmol/L (ref 22–32)
Calcium: 8.4 mg/dL — ABNORMAL LOW (ref 8.9–10.3)
Chloride: 94 mmol/L — ABNORMAL LOW (ref 98–111)
Creatinine, Ser: 0.76 mg/dL (ref 0.61–1.24)
GFR, Estimated: >60 mL/min (ref >60)
Glucose, Bld: 91 mg/dL (ref 70–99)
Potassium: 4.2 mmol/L (ref 3.5–5.1)
Sodium: 127 mmol/L — ABNORMAL LOW (ref 135–145)

## 2023-01-02 NOTE — Progress Notes (Signed)
PROGRESS NOTE                                                                                                                                                                                                             Patient Demographics:    Gregory [name], is a 82 y.o. male, DOB - January 13, 1941, EGB:[id number]  Outpatient Primary MD for the patient is Early, [name], NP    LOS - 5  Admit date - 12/27/2022    Chief Complaint  Patient presents with   Shortness of Breath       Brief Narrative (HPI from H&P)   82 y.o. male with medical history significant of BPH, HLD, hereditary telangiectasias, hypothyroidism, aflutter, hyponatremia, and ILD presenting with SOB for 5-6 days, had outpt Z pack, oral Steroids without benefit, came to the ER where he was diagnosed with acute hypoxic respiratory failure due to RSV pneumonia/bronchitis, he was placed on a Venturi mask as he cannot tolerate nasal cannula oxygen due to hereditary telangiectasias inside his nostrils.  He was admitted to the hospital for further care.   Subjective:    Gregory [name] today reports he is feeling phlegm has much improved, dyspnea has improved as well.    Assessment  & Plan :   Moderate to severe acute hypoxic respiratory failure due to RSV pneumonia/bronchitis in a patient with underlying ILD.  -He is on room air at baseline.  This morning he is on 5 L nasal cannula. 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 -Continue with IV Solu-Medrol, transition to oral prednisone when improves  -Will need CT chest with stable, oxygen requirement improves -He will need home oxygen on discharge -continue with  Mucinex  -Will need repeat CT chest, likely by Monday.   Hyponatremia.   Acute on chronic.  Clinically has elevated BNP with orthopnea. -Is down to 127 today, will change his fluid restriction to 1200 cc, repeat BMP in a.m.   Hypothyroidism.  On Synthroid.  BPH.  On Flomax.  GERD.  On PPI.  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  Currently no acute issues.  Paroxysmal atrial flutter.  Had an episode which was isolated in the past, not on anticoagulation due to above, monitor.  Not on rate control medications.  Hypertension.  On ARB.  Monitor and adjust.       Condition - Extremely Guarded  Family Communication  :  wife at bedside  Code Status :  Full  Consults  :  PCCM  PUD Prophylaxis : PPI   Procedures  :            Disposition Plan  :    Status is: inpatient  DVT Prophylaxis  :     SCDs Start: 12/27/22 1630    Lab Results  Component Value Date   PLT 310 12/30/2022    Diet :  Diet Order             Diet regular Room service appropriate? Yes; Fluid consistency: Thin; Fluid restriction: 1200 mL Fluid  Diet effective now                    Inpatient Medications  Scheduled Meds:  arformoterol  15 mcg Nebulization BID   budesonide (PULMICORT) nebulizer solution  0.25 mg Nebulization BID   docusate sodium  100 mg Oral BID   feeding supplement  237 mL Oral BID BM   gabapentin  100 mg Oral BID   guaiFENesin  1,200 mg Oral BID   irbesartan  37.5 mg Oral Daily   levothyroxine  150 mcg Oral Q0600   loratadine  10 mg Oral QPM   montelukast  10 mg Oral QHS   multivitamin with minerals  1 tablet Oral Daily   pantoprazole  40 mg Oral Daily   predniSONE  50 mg Oral Q breakfast   Followed by   Derrill Memo ON 01/04/2023] predniSONE  40 mg Oral Q breakfast   Followed by   Derrill Memo ON 01/07/2023] predniSONE  30 mg Oral Q breakfast    Followed by   Derrill Memo ON 01/10/2023] predniSONE  20 mg Oral Q breakfast   Followed by   Derrill Memo ON 01/13/2023] predniSONE  10 mg Oral Q breakfast   senna-docusate  1 tablet Oral QHS   sodium chloride flush  3 mL Intravenous Q12H   tamsulosin  0.4 mg Oral QPC supper   Continuous Infusions: PRN Meds:.acetaminophen **OR** acetaminophen, albuterol, benzonatate, bisacodyl, clorazepate, fluorometholone, hydrALAZINE, HYDROcodone bit-homatropine, methocarbamol, ondansetron **OR** ondansetron (ZOFRAN) IV, mouth rinse, polyethylene glycol  Antibiotics  :    Anti-infectives (From admission, onward)    None         Objective:   Vitals:   01/01/23 1955 01/02/23 0000 01/02/23 0400 01/02/23 1200  BP:   135/64 (!) 146/67  Pulse:  66 60 67  Resp:  '17 16 16  '$ Temp:   97.6 F (36.4 C)   TempSrc:   Axillary   SpO2: 94% 93% 93% 97%    Wt Readings from Last 3 Encounters:  12/26/22 81.2 kg  12/23/22 81.2 kg  10/28/22 83.9 kg     Intake/Output Summary (Last 24 hours) at 01/02/2023 1232 Last data filed at 01/02/2023 1204 Gross per 24 hour  Intake --  Output 950 ml  Net -950 ml     Physical Exam  Awake Alert, Oriented X 3, No new F.N deficits, Normal affect Symmetrical Chest wall movement, Good air movement bilaterally, coarse respiratory sounds bilaterally RRR,No Gallops,Rubs or new Murmurs, No  Parasternal Heave +ve B.Sounds, Abd Soft, No tenderness, No rebound - guarding or rigidity. No Cyanosis, Clubbing or edema, No new Rash or bruise          Data Review:    Recent Labs  Lab 12/26/22 1731 12/28/22 0443 12/29/22 0341 12/30/22 0548  WBC 15.6* 20.9* 16.7* 15.7*  HGB 14.7 14.2 13.9 13.1  HCT 42.7 39.8 38.7* 38.2*  PLT 278 266 283 310  MCV 90.1 88.4 90.0 89.9  MCH 31.0 31.6 32.3 30.8  MCHC 34.4 35.7 35.9 34.3  RDW 13.2 12.9 13.0 12.8  LYMPHSABS 1.4  --  1.8 3.3  MONOABS 1.1*  --  1.7* 1.8*  EOSABS 0.0  --  0.0 0.0  BASOSABS 0.0  --  0.0 0.0    Recent Labs  Lab  12/26/22 1731 12/28/22 0439 12/28/22 0443 12/28/22 0858 12/29/22 0341 12/30/22 0548 12/31/22 0610 01/01/23 0149 01/02/23 0505  NA 132*  --    < >  --  125* 130* 127* 128* 127*  K 4.8  --    < >  --  4.2 4.4 4.3 4.4 4.2  CL 95*  --    < >  --  90* 95* 93* 94* 94*  CO2 25  --    < >  --  '25 26 24 25 26  '$ ANIONGAP 12  --    < >  --  '10 9 10 9 7  '$ GLUCOSE 129*  --    < >  --  135* 90 85 106* 91  BUN 18  --    < >  --  24* '22 18 18 14  '$ CREATININE 0.94  --    < >  --  1.02 0.90 0.80 0.82 0.76  AST 30  --   --   --   --   --   --   --   --   ALT 24  --   --   --   --   --   --   --   --   ALKPHOS 59  --   --   --   --   --   --   --   --   BILITOT 0.3  --   --   --   --   --   --   --   --   ALBUMIN 4.6  --   --   --   --   --   --   --   --   CRP  --   --   --  10.7* 15.3* 8.3*  --   --   --   PROCALCITON  --  <0.10  --   --  <0.10 <0.10  --   --   --   BNP  --  136.5*  --   --  46.4 37.1  --   --   --   MG  --  1.9  --   --  2.3 2.0  --   --   --   CALCIUM 9.7  --    < >  --  8.8* 8.4* 8.1* 8.5* 8.4*   < > = values in this interval not displayed.      Radiology Reports ECHOCARDIOGRAM COMPLETE  Result Date: 12/29/2022    ECHOCARDIOGRAM REPORT   Patient Name:   Gregory [name] Date of Exam: 12/29/2022 Medical Rec #:  [id number]        Height:  70.0 in Accession #:    [id number]       Weight:       179.0 lb Date of Birth:  1941-10-01       BSA:          1.991 m Patient Age:    61 years         BP:           138/70 mmHg Patient Gender: M                HR:           94 bpm. Exam Location:  Inpatient Procedure: 2D Echo, Cardiac Doppler, Color Doppler and 3D Echo Indications:    Pulmonary hypertension I27.2  History:        Patient has prior history of Echocardiogram examinations, most                 recent 06/04/2021. Risk Factors:Dyslipidemia. Interstitial lung                 diesase.  Sonographer:    Darlina Sicilian RDCS Referring Phys: [id number] [place]  1. Left  ventricular ejection fraction, by estimation, is 55 to 60%. The left ventricle has normal function. The left ventricle has no regional wall motion abnormalities. There is mild left ventricular hypertrophy. Indeterminate diastolic filling due to E-A fusion.  2. Right ventricular systolic function is normal. The right ventricular size is normal. Tricuspid regurgitation signal is inadequate for assessing PA pressure.  3. The mitral valve is grossly normal. No evidence of mitral valve regurgitation. No evidence of mitral stenosis.  4. The aortic valve is tricuspid. Aortic valve regurgitation is not visualized. No aortic stenosis is present.  5. The inferior vena cava is normal in size with greater than 50% respiratory variability, suggesting right atrial pressure of 3 mmHg. FINDINGS  Left Ventricle: Left ventricular ejection fraction, by estimation, is 55 to 60%. The left ventricle has normal function. The left ventricle has no regional wall motion abnormalities. The left ventricular internal cavity size was normal in size. There is  mild left ventricular hypertrophy. Indeterminate diastolic filling due to E-A fusion. Right Ventricle: The right ventricular size is normal. No increase in right ventricular wall thickness. Right ventricular systolic function is normal. Tricuspid regurgitation signal is inadequate for assessing PA pressure. Left Atrium: Left atrial size was normal in size. Right Atrium: Right atrial size was normal in size. Pericardium: There is no evidence of pericardial effusion. Mitral Valve: The mitral valve is grossly normal. No evidence of mitral valve regurgitation. No evidence of mitral valve stenosis. Tricuspid Valve: The tricuspid valve is grossly normal. Tricuspid valve regurgitation is not demonstrated. Aortic Valve: The aortic valve is tricuspid. Aortic valve regurgitation is not visualized. No aortic stenosis is present. Pulmonic Valve: The pulmonic valve was grossly normal. Pulmonic valve  regurgitation is trivial. No evidence of pulmonic stenosis. Aorta: The aortic root is normal in size and structure. Venous: The inferior vena cava is normal in size with greater than 50% respiratory variability, suggesting right atrial pressure of 3 mmHg. IAS/Shunts: The atrial septum is grossly normal.  LEFT VENTRICLE PLAX 2D LVIDd:         4.85 cm   Diastology LVIDs:         2.90 cm   LV e' medial:    5.87 cm/s LV PW:         0.90 cm   LV E/e' medial:  11.2 LV  IVS:        1.10 cm   LV e' lateral:   8.16 cm/s LVOT diam:     1.90 cm   LV E/e' lateral: 8.1 LV SV:         64 LV SV Index:   32 LVOT Area:     2.84 cm  RIGHT VENTRICLE RV S prime:     21.10 cm/s TAPSE (M-mode): 1.2 cm LEFT ATRIUM             Index        RIGHT ATRIUM           Index LA diam:        3.40 cm 1.71 cm/m   RA Area:     10.80 cm LA Vol (A2C):   36.4 ml 18.28 ml/m  RA Volume:   19.20 ml  9.64 ml/m LA Vol (A4C):   36.5 ml 18.33 ml/m LA Biplane Vol: 37.4 ml 18.78 ml/m  AORTIC VALVE LVOT Vmax:   113.00 cm/s LVOT Vmean:  73.900 cm/s LVOT VTI:    0.227 m  AORTA Ao Root diam: 3.40 cm MITRAL VALVE MV Area (PHT): 4.06 cm    SHUNTS MV Decel Time: 187 msec    Systemic VTI:  0.23 m MV E velocity: 66.00 cm/s  Systemic Diam: 1.90 cm MV A velocity: 99.90 cm/s MV E/A ratio:  0.66 Cherlynn Kaiser MD Electronically signed by Cherlynn Kaiser MD Signature Date/Time: 12/29/2022/5:12:13 PM    Final       Signature  -   Phillips Climes M.D on 01/02/2023 at 12:32 PM   -  To page go to [email or web address]

## 2023-01-02 NOTE — Progress Notes (Signed)
Mobility Specialist Progress Note:   01/02/23 1025  Mobility  Activity Ambulated with assistance in hallway  Level of Assistance Standby assist, set-up cues, supervision of patient - no hands on  Assistive Device None  Distance Ambulated (ft) 300 ft  Activity Response Tolerated well  Mobility Referral Yes  $Mobility charge 1 Mobility   Pt eager for mobility session. No physical assistance required. SpO2 89-92% on 6LO2 via Simple Mask. Pt back in chair with all needs met, back on 5LO2.  Nelta Numbers Mobility Specialist Please contact via SecureChat or  Rehab office at [phone]

## 2023-01-02 NOTE — Progress Notes (Signed)
Mobility Specialist Progress Note:   01/02/23 1550  Mobility  Activity Ambulated with assistance in hallway  Level of Assistance Standby assist, set-up cues, supervision of patient - no hands on  Assistive Device None  Distance Ambulated (ft) 300 ft  Activity Response Tolerated well  Mobility Referral Yes  $Mobility charge 1 Mobility   Pt eager for second mobility session. Required 6LO2 via Simple Mask to maintain SpO2 >89%. Pt back in chair with all needs met, back on 5LO2.  Nelta Numbers Mobility Specialist Please contact via SecureChat or  Rehab office at [phone]

## 2023-01-03 ENCOUNTER — Inpatient Hospital Stay (HOSPITAL_COMMUNITY): Payer: Medicare Other

## 2023-01-03 DIAGNOSIS — B338 Other specified viral diseases: Secondary | ICD-10-CM | POA: Diagnosis not present

## 2023-01-03 LAB — BASIC METABOLIC PANEL
Anion gap: 11 (ref 5–15)
BUN: 14 mg/dL (ref 8–23)
CO2: 23 mmol/L (ref 22–32)
Calcium: 8.4 mg/dL — ABNORMAL LOW (ref 8.9–10.3)
Chloride: 91 mmol/L — ABNORMAL LOW (ref 98–111)
Creatinine, Ser: 0.82 mg/dL (ref 0.61–1.24)
GFR, Estimated: >60 mL/min (ref >60)
Glucose, Bld: 102 mg/dL — ABNORMAL HIGH (ref 70–99)
Potassium: 4.4 mmol/L (ref 3.5–5.1)
Sodium: 125 mmol/L — ABNORMAL LOW (ref 135–145)

## 2023-01-03 MED ORDER — SODIUM CHLORIDE 1 G PO TABS
1.0000 g | ORAL_TABLET | Freq: Two times a day (BID) | ORAL | Status: DC
  Administered 2023-01-03 – 2023-01-04 (×3): 1 g via ORAL
  Filled 2023-01-03 (×3): qty 1

## 2023-01-03 MED ORDER — FUROSEMIDE 10 MG/ML IJ SOLN
20.0000 mg | Freq: Once | INTRAMUSCULAR | Status: AC
  Administered 2023-01-03: 20 mg via INTRAVENOUS
  Filled 2023-01-03: qty 2

## 2023-01-03 NOTE — Progress Notes (Signed)
PROGRESS NOTE                                                                                                                                                                                                             Patient Demographics:    Gregory [name], is a 82 y.o. male, DOB - [birth year], JME:[id number]  Outpatient Primary MD for the patient is Early, [name], NP    LOS - 6  Admit date - 12/27/2022    Chief Complaint  Patient presents with   Shortness of Breath       Brief Narrative (HPI from H&P)   82 y.o. male with medical history significant of BPH, HLD, hereditary telangiectasias, hypothyroidism, aflutter, hyponatremia, and ILD presenting with SOB for 5-6 days, had outpt Z pack, oral Steroids without benefit, came to the ER where he was diagnosed with acute hypoxic respiratory failure due to RSV pneumonia/bronchitis, he was placed on a Venturi mask as he cannot tolerate nasal cannula oxygen due to hereditary telangiectasias inside his nostrils.  He was admitted to the hospital for further care.   Subjective:    Pat Kocher today ports he is able to ambulate with oxygen better yesterday   Assessment  & Plan :   Moderate to severe acute hypoxic respiratory failure due to RSV pneumonia/bronchitis in a patient with underlying ILD.  -He is on room air at baseline.  This morning he is on 5 L nasal cannula. 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 -Did with IV Solu-Medrol, will transition to oral prednisone tomorrow  -CT chest was obtained today, significant for underlying interstitial lung disease, per PCCM evaluation it is difficult to make a pronouncement  of any progression  -He was encouraged to use incentive spirometry and flutter valve  -He will need oxygen on discharge, likely via mask instead of nasal cannula given history of previous nasal surgery.    Hyponatremia.   Acute on chronic.  Clinically has elevated BNP with orthopnea. -Is down to 125 today, will give 1 dose of IV Lasix, continue with fluid restriction and will start on salt tablets.  Hypothyroidism.  On Synthroid.  BPH.  On Flomax.  GERD.  On PPI.  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  Currently no acute issues.  Paroxysmal atrial flutter.  Had an episode which was isolated in the past, not on anticoagulation due to above, monitor.  Not on rate control medications.  Hypertension.  On ARB.  Monitor and adjust.       Condition - Extremely Guarded  Family Communication  :  wife at bedside  Code Status :  Full  Consults  :  PCCM  PUD Prophylaxis : PPI   Procedures  :            Disposition Plan  :    Status is: inpatient  DVT Prophylaxis  :     SCDs Start: 12/27/22 1630    Lab Results  Component Value Date   PLT 310 12/30/2022    Diet :  Diet Order             Diet regular Room service appropriate? Yes; Fluid consistency: Thin; Fluid restriction: 1200 mL Fluid  Diet effective now                    Inpatient Medications  Scheduled Meds:  arformoterol  15 mcg Nebulization BID   budesonide (PULMICORT) nebulizer solution  0.25 mg Nebulization BID   docusate sodium  100 mg Oral BID   feeding supplement  237 mL Oral BID BM   gabapentin  100 mg Oral BID   guaiFENesin  1,200 mg Oral BID   irbesartan  37.5 mg Oral Daily   levothyroxine  150 mcg Oral Q0600   loratadine  10 mg Oral QPM   montelukast  10 mg Oral QHS   multivitamin with minerals  1 tablet Oral Daily   pantoprazole  40 mg Oral Daily   [START ON  01/04/2023] predniSONE  40 mg Oral Q breakfast   Followed by   Derrill Memo ON 01/07/2023] predniSONE  30 mg Oral Q breakfast   Followed by   Derrill Memo ON 01/10/2023] predniSONE  20 mg Oral Q breakfast   Followed by   Derrill Memo ON 01/13/2023] predniSONE  10 mg Oral Q breakfast   senna-docusate  1 tablet Oral QHS   sodium chloride flush  3 mL Intravenous Q12H   sodium chloride  1 g Oral BID WC   tamsulosin  0.4 mg Oral QPC supper   Continuous Infusions: PRN Meds:.acetaminophen **OR** acetaminophen, albuterol, benzonatate, bisacodyl, clorazepate, fluorometholone, hydrALAZINE, HYDROcodone bit-homatropine, methocarbamol, ondansetron **OR** ondansetron (ZOFRAN) IV, mouth rinse, polyethylene glycol  Antibiotics  :    Anti-infectives (From admission, onward)    None         Objective:   Vitals:   01/02/23 2300 01/03/23 0400 01/03/23 0800 01/03/23 0840  BP: (!) 154/71 127/63 (!) 159/69   Pulse: 86 60 77 81  Resp: '18 15 20 '$ (!) 21  Temp: 98.1 F (36.7 C) 97.6 F (36.4 C) 98.2 F (36.8 C)   TempSrc: Axillary Axillary Oral   SpO2: 94% 96% 94% 100%    Wt Readings from Last 3 Encounters:  12/26/22 81.2 kg  12/23/22 81.2 kg  10/28/22 83.9 kg     Intake/Output Summary (Last 24 hours) at 01/03/2023 1426 Last data filed at 01/03/2023 0548 Gross per 24 hour  Intake --  Output 1575 ml  Net -1575 ml  Physical Exam  Awake Alert, Oriented X 3, No new F.N deficits, Normal affect Symmetrical Chest wall movement, scattered Rales and rhonchi  RRR,No Gallops,Rubs or new Murmurs, No Parasternal Heave +ve B.Sounds, Abd Soft, No tenderness, No rebound - guarding or rigidity. No Cyanosis, Clubbing or edema, No new Rash or bruise          Data Review:    Recent Labs  Lab 12/28/22 0443 12/29/22 0341 12/30/22 0548  WBC 20.9* 16.7* 15.7*  HGB 14.2 13.9 13.1  HCT 39.8 38.7* 38.2*  PLT 266 283 310  MCV 88.4 90.0 89.9  MCH 31.6 32.3 30.8  MCHC 35.7 35.9 34.3  RDW 12.9 13.0 12.8   LYMPHSABS  --  1.8 3.3  MONOABS  --  1.7* 1.8*  EOSABS  --  0.0 0.0  BASOSABS  --  0.0 0.0    Recent Labs  Lab 12/28/22 0439 12/28/22 0443 12/28/22 0858 12/29/22 0341 12/30/22 0548 12/31/22 0610 01/01/23 0149 01/02/23 0505 01/03/23 0413  NA  --    < >  --  125* 130* 127* 128* 127* 125*  K  --    < >  --  4.2 4.4 4.3 4.4 4.2 4.4  CL  --    < >  --  90* 95* 93* 94* 94* 91*  CO2  --    < >  --  '25 26 24 25 26 23  '$ ANIONGAP  --    < >  --  '10 9 10 9 7 11  '$ GLUCOSE  --    < >  --  135* 90 85 106* 91 102*  BUN  --    < >  --  24* '22 18 18 14 14  '$ CREATININE  --    < >  --  1.02 0.90 0.80 0.82 0.76 0.82  CRP  --   --  10.7* 15.3* 8.3*  --   --   --   --   PROCALCITON <0.10  --   --  <0.10 <0.10  --   --   --   --   BNP 136.5*  --   --  46.4 37.1  --   --   --   --   MG 1.9  --   --  2.3 2.0  --   --   --   --   CALCIUM  --    < >  --  8.8* 8.4* 8.1* 8.5* 8.4* 8.4*   < > = values in this interval not displayed.      Radiology Reports CT CHEST WO CONTRAST  Result Date: 01/03/2023 CLINICAL DATA:  Follow-up interstitial lung disease. Hypoxia. History of Osler Weber Rendu syndrome. EXAM: CT CHEST WITHOUT CONTRAST TECHNIQUE: Multidetector CT imaging of the chest was performed following the standard protocol without IV contrast. RADIATION DOSE REDUCTION: This exam was performed according to the departmental dose-optimization program which includes automated exposure control, adjustment of the mA and/or kV according to patient size and/or use of iterative reconstruction technique. COMPARISON:  08/17/2021 and 12/07/2020 FINDINGS: Cardiovascular: Heart is normal size. Mild calcified plaque in the region of the left main coronary artery. Thoracic aorta is normal in caliber. Minimal calcified plaque over the descending thoracic aorta. Main and proximal pulmonary arteries are normal. Mediastinum/Nodes: No mediastinal or hilar adenopathy. Remaining mediastinal structures are unremarkable.  Lungs/Pleura: Lungs are well inflated. Subtle stable bilateral increased peripheral interstitial markings. Stable biapical pleural thickening with minimal associated pleural calcification. Stable known posterior  right lower lobe AVM. Patchy hazy airspace opacification over the bilateral lower lobes which may be due to infectious or inflammatory process. No effusion. Airways are unremarkable Upper Abdomen: Mild calcified plaque over the abdominal aorta. No acute findings. Musculoskeletal: No focal abnormality. IMPRESSION: 1. Patchy hazy airspace opacification over the bilateral lower lobes which may be due to an infectious or inflammatory process. 2. 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 3. Aortic atherosclerosis. Mild atherosclerotic coronary artery disease. Aortic Atherosclerosis (ICD10-I70.0). Electronically Signed   By: Marin Olp M.D.   On: 01/03/2023 10:10      Signature  -   Emeline Gins Zanaiya Calabria M.D on 01/03/2023 at 2:26 PM   -  To page go to [email or web address]

## 2023-01-03 NOTE — TOC Progression Note (Signed)
Transition of Care [facility]) - Progression Note    Patient Details  Name: Gregory [name] MRN: [id number] Date of Birth: [birth year]  Transition of Care [facility] CM/SW Contact  Loletha Grayer Beverely Pace, RN Phone Number: 01/03/2023, 2:47 PM  Clinical Narrative:    Case Manager spoke with patient's wife concerning his need for oxygen at discharge. Patient will require 5L oxygen via face mask. CM contacted Jermaine with [employer] and requested oxygen, face masks and Nasal canula as med requested. Will be delivered to patient's room.    Expected Discharge Plan: Home/Self Care Barriers to Discharge: Continued Medical Work up  Expected Discharge Plan and Services   Discharge Planning Services: CM Consult Post Acute Care Choice: Durable Medical Equipment Living arrangements for the past 2 months: Single Family Home                 DME Arranged: Oxygen DME Agency: [employer] Date DME Agency Contacted: 01/03/23 Time DME Agency Contacted: 0211 Representative spoke with at DME Agency: Melene Muller             Social Determinants of Health (SDOH) Interventions SDOH Screenings   Depression (PHQ2-9): Low Risk  (09/06/2022)  Financial Resource Strain: Low Risk  (11/24/2020)  Tobacco Use: Low Risk  (12/27/2022)    Readmission Risk Interventions     No data to display

## 2023-01-03 NOTE — Progress Notes (Signed)
Mobility Specialist Progress Note:    01/03/23 0930  Mobility  Activity Ambulated with assistance in hallway  Level of Assistance Standby assist, set-up cues, supervision of patient - no hands on  Assistive Device None  Distance Ambulated (ft) 300 ft  Activity Response Tolerated well  Mobility Referral Yes  $Mobility charge 1 Mobility   Pt was agreeable for mobility session. SpO2 was 87% while ambulating with 6LO2 via simple mask, recovered with rest. CT came for pt near EOS. Left pt in care of transport.   Royetta Crochet Mobility Specialist Please contact via Solicitor or  Rehab office at [phone]

## 2023-01-03 NOTE — Progress Notes (Signed)
   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  He presents with increasing hypoxia positive for RSV now up to 50% facemask with O2 sats of 88 to 92%.  He has been treated with steroids diuretics and reports feeling better.  Due to his underlying interstitial lung disease pulmonary critical care was asked to evaluate.  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  There is some concern that he may have a "walking pneumonia..  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  WITHOUT HEMOPTYSIS   History of nonmelanoma skin cancer EXCISION SQUAMOUS CELL FROM HAND   Hypertrophy of prostate with urinary obstruction and other lower urinary tract symptoms (LUTS)    ILD (interstitial lung disease) (HCC)    Irritable bowel syndrome    Nocturnal leg cramps 06/11/2013   Plantar fascial fibromatosis    Pure hypercholesterolemia    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 rate 18, SpO2 90 %.    FiO2 (%):  [40 %] 40 %   Intake/Output Summary (Last 24 hours) at 12/31/2022 0837 Last data filed at 12/31/2022 0500 Gross per 24 hour  Intake 3 ml  Output 850 ml  Net -847 ml   There were no vitals filed for this visit.  Physical Exam: General: Sitting up in chair currently no acute distress HEENT normocephalic atraumatic no jugular venous distention Pulmonary: Posterior rales, no accessory use currently on 5 L simple facemask Cardiac regular rate and rhythm Abdomen soft nontender Remedies warm and dry  Resolved Hospital Problem list     Assessment & Plan:   Acute hypoxemic respiratory failure 2/2 RSV pneumonia/pneumonitis superimposed on chronic interstitial lung disease -Not able to tolerate nasal cannula due to prior nasal surgery -Clinically improving Plan Prednisone taper as outlined Follow-up with Gregory [name] in the outpatient setting, will send a message to our office to arrange this Needs home O2, hopefully this will be not long-term  Gregory [name] ACNP-BC [place] Pager # [phone] OR # [phone] if no answer

## 2023-01-03 NOTE — Progress Notes (Signed)
Physical Therapy Treatment Patient Details Name: Gregory [name] MRN: [id number] DOB: January 20, 1941 Today's Date: 01/03/2023   History of Present Illness 82 yo male presents to [facility] on 1/7 with ShOB requiring supplemental O2, d/ced and returned on 1/8 for continued O2 desaturation. + RSV. PMH includes  BPH, HLD, hereditary telangiectasias, hypothyroidism, aflutter, and ILD.    PT Comments    Pt presents today, tolerating session well. Pt ambulating with CGA for 1 LOB, on 6L O2 for ambulation with ~2 instances of desat to ~86%, recovering well with standing rest breaks and cues for deep breathing. Pt able to ascend/descend 4 steps today with CGA and use of R rail, no LOB or concerns from pt. Pt instructed on taking rest breaks when returning home and monitoring O2, both pt and his wife verbalizing understanding. Pt will continue to benefit from skilled acute PT at this time to progress activity tolerance and endurance, current discharge recommendations remain appropriate.     Recommendations for follow up therapy are one component of a multi-disciplinary discharge planning process, led by the attending physician.  Recommendations may be updated based on patient status, additional functional criteria and insurance authorization.  Follow Up Recommendations  Other (comment) (continue OPPT)     Assistance Recommended at Discharge PRN  Patient can return home with the following A little help with walking and/or transfers   Equipment Recommendations  None recommended by PT    Recommendations for Other Services       Precautions / Restrictions Precautions Precautions: Fall Precaution Comments: Watch O2 sats Restrictions Weight Bearing Restrictions: No     Mobility  Bed Mobility               General bed mobility comments: pt seated in chair upon arrival of PT, left in chair at end of session Patient Response: Cooperative  Transfers Overall transfer level: Needs  assistance Equipment used: None Transfers: Sit to/from Stand Sit to Stand: Supervision           General transfer comment: supervision for sit<>stand transfers at this time    Ambulation/Gait Ambulation/Gait assistance: Min guard Gait Distance (Feet): 325 Feet Assistive device: None Gait Pattern/deviations: Drifts right/left, Trunk flexed, Step-through pattern Gait velocity: decreased     General Gait Details: mild path deviation and one instance of LOB but pt able to self correct with CGA   Stairs Stairs: Yes Stairs assistance: Min guard Stair Management: One rail Right, Alternating pattern, Forwards Number of Stairs: 4 General stair comments: good toelrance of stairs, CGA for safety, mild desat to ~87% but recovers quickly with standing rest break   Wheelchair Mobility    Modified Rankin (Stroke Patients Only)       Balance Overall balance assessment: Needs assistance Sitting-balance support: No upper extremity supported, Feet supported Sitting balance-Leahy Scale: Good Sitting balance - Comments: no sway noted with static sitting   Standing balance support: No upper extremity supported, During functional activity Standing balance-Leahy Scale: Fair Standing balance comment: one LOB with looking backwards but able to self correct, no other balance deficits noted                            Cognition Arousal/Alertness: Awake/alert Behavior During Therapy: WFL for tasks assessed/performed Overall Cognitive Status: Within Functional Limits for tasks assessed  Exercises      General Comments General comments (skin integrity, edema, etc.): pt with good tolerance for today's session, noted improved steadiness with ambulation      Pertinent Vitals/Pain Pain Assessment Pain Assessment: 0-10 Pain Score: 3  Pain Location: back Pain Descriptors / Indicators: Aching Pain Intervention(s):  Monitored during session    Home Living                          Prior Function            PT Goals (current goals can now be found in the care plan section) Acute Rehab PT Goals PT Goal Formulation: With patient Time For Goal Achievement: 01/11/23 Potential to Achieve Goals: Good Progress towards PT goals: Progressing toward goals    Frequency    Min 3X/week      PT Plan Current plan remains appropriate    Co-evaluation              AM-PAC PT "6 Clicks" Mobility   Outcome Measure  Help needed turning from your back to your side while in a flat bed without using bedrails?: None Help needed moving from lying on your back to sitting on the side of a flat bed without using bedrails?: None Help needed moving to and from a bed to a chair (including a wheelchair)?: A Little Help needed standing up from a chair using your arms (e.g., wheelchair or bedside chair)?: A Little Help needed to walk in hospital room?: A Little Help needed climbing 3-5 steps with a railing? : A Little 6 Click Score: 20    End of Session Equipment Utilized During Treatment: Oxygen Activity Tolerance: Patient tolerated treatment well Patient left: in chair;with call bell/phone within reach;with family/visitor present Nurse Communication: Mobility status PT Visit Diagnosis: Other abnormalities of gait and mobility (R26.89)     Time: 5726-2035 PT Time Calculation (min) (ACUTE ONLY): 15 min  Charges:  $Gait Training: 8-22 mins                     Charlynne Cousins, PT DPT Acute Rehabilitation Services Office [phone]    Luvenia Heller 01/03/2023, 1:11 PM

## 2023-01-03 NOTE — Progress Notes (Signed)
NAME:  FRANKO HILLIKER, MRN:  [id number], DOB:  [birth year], LOS: 6 ADMISSION DATE:  12/27/2022, CONSULTATION DATE: 12/28/2022 REFERRING MD: [facility], CHIEF COMPLAINT: Proximal respiratory failure    History of Present Illness:  Mr. Brenneman is an 82 year old male who is followed by Dr. 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  He presents with increasing hypoxia positive for RSV now up to 50% facemask with O2 sats of 88 to 92%.  He has been treated with steroids diuretics and reports feeling better.  Due to his underlying interstitial lung disease pulmonary critical care was asked to evaluate.  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  There is some concern that he may have a "walking pneumonia..  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  WITHOUT HEMOPTYSIS   History of nonmelanoma skin cancer EXCISION SQUAMOUS CELL FROM HAND   Hypertrophy of prostate with urinary obstruction and other lower urinary tract symptoms (LUTS)    ILD (interstitial lung disease) (HCC)    Irritable bowel syndrome    Nocturnal leg cramps 06/11/2013   Plantar fascial fibromatosis    Pure hypercholesterolemia    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    Symptomatic anemia 08/22/2021   Telangiectasia, hereditary hemorrhagic, of Rendu, 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 rate (!) 21, SpO2 100 %.        Intake/Output Summary (Last 24 hours) at 01/03/2023 0952 Last data filed at 01/03/2023 0548 Gross per 24 hour  Intake --  Output 1875 ml  Net -1875 ml   There were no vitals filed for this visit.  Physical Exam: General: Elderly gentleman, does not appear to be in distress HEENT: Moist oral mucosa Neuro: Alert and oriented x 3 nonfocal CV: S1-S2 appreciated PULM: Decreased air movement bilaterally Currently on 50% facemask with sats of 88 to 90% GI: soft, bsx4 active  GU: Voids Extremities: warm/dry, 2+ edema  Skin: no rashes or lesions   Intake/Output Summary (Last 24 hours) at 01/03/2023 0623 Last data filed at 01/03/2023 0548 Gross per 24 hour  Intake --  Output 1875 ml  Net -1875 ml    Resolved Hospital Problem list     Assessment & Plan:   Acute hypoxemic respiratory failure RSV pneumonia -Still with increased oxygen requirement -On steroids -Continue bronchodilators -CT scan film was reviewed with the patient  Underlying interstitial lung disease, difficult to make a pronouncement of any progression  Needs follow-up appointment in the office following discharge  He will need oxygen supplementation  in the short-term He did see Dr. Silas Flood previously and will follow-up with him  Sherrilyn Rist, MD [place] [facility] Pager: See Shea Evans

## 2023-01-03 NOTE — Telephone Encounter (Signed)
Left message to schedule follow up visit

## 2023-01-04 ENCOUNTER — Other Ambulatory Visit (HOSPITAL_COMMUNITY): Payer: Self-pay

## 2023-01-04 DIAGNOSIS — I480 Paroxysmal atrial fibrillation: Secondary | ICD-10-CM | POA: Diagnosis not present

## 2023-01-04 DIAGNOSIS — J9601 Acute respiratory failure with hypoxia: Secondary | ICD-10-CM | POA: Diagnosis not present

## 2023-01-04 DIAGNOSIS — B338 Other specified viral diseases: Secondary | ICD-10-CM | POA: Diagnosis not present

## 2023-01-04 LAB — BASIC METABOLIC PANEL
Anion gap: 8 (ref 5–15)
BUN: 17 mg/dL (ref 8–23)
CO2: 29 mmol/L (ref 22–32)
Calcium: 8.8 mg/dL — ABNORMAL LOW (ref 8.9–10.3)
Chloride: 91 mmol/L — ABNORMAL LOW (ref 98–111)
Creatinine, Ser: 0.85 mg/dL (ref 0.61–1.24)
GFR, Estimated: >60 mL/min (ref >60)
Glucose, Bld: 86 mg/dL (ref 70–99)
Potassium: 4.7 mmol/L (ref 3.5–5.1)
Sodium: 128 mmol/L — ABNORMAL LOW (ref 135–145)

## 2023-01-04 MED ORDER — ACETAMINOPHEN 325 MG PO TABS: 650.0000 mg | ORAL_TABLET | Freq: Four times a day (QID) | ORAL | Status: AC | PRN

## 2023-01-04 MED ORDER — ALBUTEROL SULFATE (2.5 MG/3ML) 0.083% IN NEBU: 2.5000 mg | INHALATION_SOLUTION | Freq: Four times a day (QID) | RESPIRATORY_TRACT | 0 refills | Status: DC | PRN

## 2023-01-04 MED ORDER — PREDNISONE 10 MG PO TABS
ORAL_TABLET | ORAL | 0 refills | Status: DC
  Filled 2023-01-04: qty 30, 12d supply, fill #0

## 2023-01-04 MED ORDER — ALBUTEROL SULFATE (2.5 MG/3ML) 0.083% IN NEBU
2.5000 mg | INHALATION_SOLUTION | Freq: Four times a day (QID) | RESPIRATORY_TRACT | 0 refills | Status: DC | PRN
  Filled 2023-01-04: qty 75, 7d supply, fill #0

## 2023-01-04 NOTE — TOC Transition Note (Signed)
Transition of Care [facility]) - CM/SW Discharge Note   Patient Details  Name: Gregory [name] MRN: [id number] Date of Birth: [birth year]  Transition of Care [facility]) CM/SW Contact:  Carles Collet, RN Phone Number: 01/04/2023, 10:28 AM   Clinical Narrative:      Home oxygen ordered yesterday through [employer], requested [employer] to deliver nebulizer to room today in preporation for DC.    Barriers to Discharge: Continued Medical Work up   Patient Goals and CMS Choice   Choice offered to / list presented to : Spouse  Discharge Placement                         Discharge Plan and Services Additional resources added to the After Visit Summary for     Discharge Planning Services: CM Consult Post Acute Care Choice: Durable Medical Equipment          DME Arranged: Oxygen DME Agency: [employer] Date DME Agency Contacted: 01/03/23 Time DME Agency Contacted: [id number] Representative spoke with at DME Agency: Melene Muller            Social Determinants of Health (SDOH) Interventions SDOH Screenings   Depression (PHQ2-9): Low Risk  (09/06/2022)  Financial Resource Strain: Low Risk  (11/24/2020)  Tobacco Use: Low Risk  (12/27/2022)     Readmission Risk Interventions     No data to display

## 2023-01-04 NOTE — Discharge Instructions (Signed)
Follow with Primary MD Early, [name], NP in 7 days   Get CBC, CMP,  checked  by Primary MD next visit.    Activity: As tolerated with Full fall precautions use walker/cane & assistance as needed   Disposition Home    Diet: Heart Healthy    On your next visit with your primary care physician please Get Medicines reviewed and adjusted.   Please request your Prim.MD to go over all Hospital Tests and Procedure/Radiological results at the follow up, please get all Hospital records sent to your Prim MD by signing hospital release before you go home.   If you experience worsening of your admission symptoms, develop shortness of breath, life threatening emergency, suicidal or homicidal thoughts you must seek medical attention immediately by calling 911 or calling your MD immediately  if symptoms less severe.  You Must read complete instructions/literature along with all the possible adverse reactions/side effects for all the Medicines you take and that have been prescribed to you. Take any new Medicines after you have completely understood and accpet all the possible adverse reactions/side effects.   Do not drive, operating heavy machinery, perform activities at heights, swimming or participation in water activities or provide baby sitting services if your were admitted for syncope or siezures until you have seen by Primary MD or a Neurologist and advised to do so again.  Do not drive when taking Pain medications.    Do not take more than prescribed Pain, Sleep and Anxiety Medications  Special Instructions: If you have smoked or chewed Tobacco  in the last 2 yrs please stop smoking, stop any regular Alcohol  and or any Recreational drug use.  Wear Seat belts while driving.   Please note  You were cared for by a hospitalist during your hospital stay. If you have any questions about your discharge medications or the care you received while you were in the hospital after you are  discharged, you can call the unit and asked to speak with the hospitalist on call if the hospitalist that took care of you is not available. Once you are discharged, your primary care physician will handle any further medical issues. Please note that NO REFILLS for any discharge medications will be authorized once you are discharged, as it is imperative that you return to your primary care physician (or establish a relationship with a primary care physician if you do not have one) for your aftercare needs so that they can reassess your need for medications and monitor your lab values.

## 2023-01-04 NOTE — Discharge Summary (Addendum)
Physician Discharge Summary  Gregory [name] ION:[id number] DOB: Nov 11, 1941 DOA: 12/27/2022  PCP: Gregory Render, NP  Admit date: 12/27/2022 Discharge date: 01/04/2023  Admitted From: (Home) Disposition:  (Home )  Recommendations for Outpatient Follow-up:  Follow up with PCP in 1-2 weeks Please obtain BMP/CBC in one week    Equipment/Devices: (oxygen 5 L)  Discharge Condition: (Stable) CODE STATUS: (FULL) Diet recommendation: Heart Healthy    Brief/Interim Summary:   82 y.o. male with medical history significant of BPH, HLD, hereditary telangiectasias, hypothyroidism, aflutter, hyponatremia, and ILD presenting with SOB for 5-6 days, had outpt Z pack, oral Steroids without benefit, came to the ER where he was diagnosed with acute hypoxic respiratory failure due to RSV pneumonia/bronchitis, he was placed on a Venturi mask as he cannot tolerate nasal cannula oxygen due to hereditary telangiectasias inside his nostrils.  He was admitted to the hospital for further care.    acute hypoxic respiratory failure due to RSV pneumonia/bronchitis in a patient with underlying ILD.  -He is on room air at baseline.  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 -Required IV steroids initially during hospital stay, now his hypoxia has improved he will be discharged on prolonged prednisone taper per pulmonary recommendation, -CT chest was obtained , significant for underlying interstitial lung disease, per PCCM evaluation it is difficult to make a pronouncement of any progression  -He was encouraged  to take his incentive spirometry and flutter valve, and keep using them     Hyponatremia.   Acute on  chronic   Hypothyroidism.  On Synthroid.   BPH.  On Flomax.   GERD.  On PPI.   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  Currently no acute issues.   Paroxysmal atrial flutter.  Had an episode which was isolated in the past, not on anticoagulation due to above, monitor.  Not on rate control medications.   Hypertension.  On ARB.      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    Activity: As tolerated with Full fall precautions use walker/cane & assistance as needed   Disposition Home    Diet: Heart Healthy    On your next visit with your primary care physician please Get Medicines reviewed and adjusted.   Please request your Prim.MD to go over all Hospital Tests and Procedure/Radiological results at the follow up, please get all Hospital records sent to your Prim MD by signing hospital release before you go home.   If you experience worsening of your admission symptoms, develop shortness  of breath, life threatening emergency, suicidal or homicidal thoughts you must seek medical attention immediately by calling 911 or calling your MD immediately  if symptoms less severe.  You Must read complete instructions/literature along with all the possible adverse reactions/side effects for all the Medicines you take and that  have been prescribed to you. Take any new Medicines after you have completely understood and accpet all the possible adverse reactions/side effects.   Do not drive, operating heavy machinery, perform activities at heights, swimming or participation in water activities or provide baby sitting services if your were admitted for syncope or siezures until you have seen by Primary MD or a Neurologist and advised to do so again.  Do not drive when taking Pain medications.    Do not take more than prescribed Pain, Sleep and Anxiety Medications  Special Instructions: If you have smoked or chewed Tobacco  in the last 2 yrs please stop smoking, stop any regular Alcohol  and or any Recreational drug use.  Wear Seat belts while driving.   Please note  You were cared for by a hospitalist during your hospital stay. If you have any questions about your discharge medications or the care you received while you were in the hospital after you are discharged, you can call the unit and asked to speak with the hospitalist on call if the hospitalist that took care of you is not available. Once you are discharged, your primary care physician will handle any further medical issues. Please note that NO REFILLS for any discharge medications will be authorized once you are discharged, as it is imperative that you return to your primary care physician (or establish a relationship with a primary care physician if you do not have one) for your aftercare needs so that they can reassess your need for medications and monitor your lab values.   Increase activity slowly   Complete by: As directed       Allergies as of 01/04/2023       Reactions   Nsaids Other (See Comments)   Cannot take-per MD   Other Other (See Comments)   All blood thinners-cannot take per MD   Aspirin Other (See Comments)   nosebleeds   Statins Other (See Comments)   Weakness, myalgias   Demeclocycline Other (See Comments)   Passed out   Dust Mite  Extract Other (See Comments)   Tizanidine Hcl Other (See Comments)   Patient stated that after taking this medication he experienced feelings of dizziness and disorientation.         Medication List     STOP taking these medications    acetaminophen 650 MG CR tablet Commonly known as: TYLENOL Replaced by: acetaminophen 325 MG tablet       TAKE these medications    acetaminophen 325 MG tablet Commonly known as: TYLENOL Take 2 tablets (650 mg total) by mouth every 6 (six) hours as needed for mild pain (or Fever >/= 101). Replaces: acetaminophen 650 MG CR tablet   albuterol 108 (90 Base) MCG/ACT inhaler Commonly known as: VENTOLIN HFA Inhale 2 puffs into the lungs every 6 (six) hours as needed for wheezing or shortness of breath. What changed: Another medication with the same name was added. Make sure you understand how and when to take each.   albuterol (2.5 MG/3ML) 0.083% nebulizer solution Commonly known as: PROVENTIL Take 3 mLs (2.5 mg total) by nebulization every 6 (six) hours as needed for wheezing. What changed: You were already taking  a medication with the same name, and this prescription was added. Make sure you understand how and when to take each.   benzonatate 200 MG capsule Commonly known as: TESSALON Take 1 capsule (200 mg total) by mouth 3 (three) times daily as needed for cough.   Cholecalciferol 125 MCG (5000 UT) capsule Take 5,000 Units by mouth daily.   clidinium-chlordiazePOXIDE 5-2.5 MG capsule Commonly known as: LIBRAX Take 1 capsule by mouth 3 (three) times daily before meals. What changed:  when to take this reasons to take this   clorazepate 7.5 MG tablet Commonly known as: Tranxene-T Take 1 tablet (7.5 mg total) by mouth at bedtime as needed for anxiety.   docusate sodium 100 MG capsule Commonly known as: COLACE Take 200 mg by mouth at bedtime.   fluorometholone 0.1 % ophthalmic suspension Commonly known as: FML Place 1 drop into both  eyes 2 (two) times daily.   fluorouracil 5 % cream Commonly known as: EFUDEX daily   gabapentin 100 MG capsule Commonly known as: NEURONTIN Take 1 capsule (100 mg total) by mouth 2 (two) times daily.   HYDROcodone bit-homatropine 5-1.5 MG/5ML syrup Commonly known as: HYCODAN Take 5 mLs by mouth every 8 (eight) hours as needed for cough.   Iron (Ferrous Sulfate) 325 (65 Fe) MG Tabs Take 325 mg by mouth in the morning, at noon, and at bedtime.   levocetirizine 5 MG tablet Commonly known as: XYZAL Take 1 tablet (5 mg total) by mouth every evening.   levothyroxine 150 MCG tablet Commonly known as: Synthroid Take 1 tablet (150 mcg total) by mouth daily before breakfast.   magic mouthwash (nystatin, hydrocortisone, diphenhydrAMINE) suspension Swish and swallow 5 mLs 3 (three) times daily as needed (mouth and throat irritation). What changed: reasons to take this   Magnesium 250 MG Tabs Take 250 mg by mouth at bedtime.   meclizine 25 MG tablet Commonly known as: ANTIVERT Take 1 tablet (25 mg total) by mouth 2 (two) times daily as needed for dizziness.   methocarbamol 500 MG tablet Commonly known as: ROBAXIN Take 1 tablet (500 mg total) by mouth every 8 (eight) hours as needed for muscle spasms.   montelukast 10 MG tablet Commonly known as: SINGULAIR TAKE 1 TABLET BY MOUTH AT  BEDTIME   multivitamin with minerals Tabs tablet Take 1 tablet by mouth every morning.   pantoprazole 40 MG tablet Commonly known as: PROTONIX TAKE 1 TABLET BY MOUTH TWICE  DAILY What changed: when to take this   predniSONE 10 MG tablet Commonly known as: DELTASONE Take 4 tablets (40 mg) by mouth daily for 3 days, then 3 tablets (30 mg) daily for 3 days, then 2 tablets (20 mg) daily for 3 days, then 1 tablet (10 mg) daily for 3 days. Start taking on: January 05, 2023   silodosin 8 MG Caps capsule Commonly known as: RAPAFLO Take 1 capsule (8 mg total) by mouth at bedtime. TAKE ONE CAPSULE BY  MOUTH DAILY AT BEDTIME   valsartan 40 MG tablet Commonly known as: DIOVAN TAKE 1 TABLET BY MOUTH EVERY DAY   vitamin C 1000 MG tablet Take 500 mg by mouth daily.               Durable Medical Equipment  (From admission, onward)           Start     Ordered   01/04/23 1030  For home use only DME Nebulizer machine  Once  Question Answer Comment  Patient needs a nebulizer to treat with the following condition ILD (interstitial lung disease) ([place])   Length of Need 12 Months      01/04/23 1030   01/03/23 1426  For home use only DME oxygen  Once       Question Answer Comment  Length of Need 12 Months   Mode or (Route) Mask   Liters per Minute 5   Frequency Continuous (stationary and portable oxygen unit needed)   Oxygen conserving device Yes   Oxygen delivery system Gas      01/03/23 [place], [place] .   Contact information: [address] [place] [address] [phone]                Allergies  Allergen Reactions   Nsaids Other (See Comments)    Cannot take-per MD   Other Other (See Comments)    All blood thinners-cannot take per MD   Aspirin Other (See Comments)    nosebleeds   Statins Other (See Comments)    Weakness, myalgias   Demeclocycline Other (See Comments)    Passed out   Dust Mite Extract Other (See Comments)   Tizanidine Hcl Other (See Comments)    Patient stated that after taking this medication he experienced feelings of dizziness and disorientation.     Consultations: PCCM   Procedures/Studies: CT CHEST WO CONTRAST  Result Date: 01/03/2023 CLINICAL DATA:  Follow-up interstitial lung disease. Hypoxia. History of Osler Weber Rendu syndrome. EXAM: CT CHEST WITHOUT CONTRAST TECHNIQUE: Multidetector CT imaging of the chest was performed following the standard protocol without IV contrast. RADIATION DOSE REDUCTION: This exam was performed according  to the departmental dose-optimization program which includes automated exposure control, adjustment of the mA and/or kV according to patient size and/or use of iterative reconstruction technique. COMPARISON:  08/17/2021 and 12/07/2020 FINDINGS: Cardiovascular: Heart is normal size. Mild calcified plaque in the region of the left main coronary artery. Thoracic aorta is normal in caliber. Minimal calcified plaque over the descending thoracic aorta. Main and proximal pulmonary arteries are normal. Mediastinum/Nodes: No mediastinal or hilar adenopathy. Remaining mediastinal structures are unremarkable. Lungs/Pleura: Lungs are well inflated. Subtle stable bilateral increased peripheral interstitial markings. Stable biapical pleural thickening with minimal associated pleural calcification. 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 Patchy hazy airspace opacification over the bilateral lower lobes which may be due to infectious or inflammatory process. No effusion. Airways are unremarkable Upper Abdomen: Mild calcified plaque over the abdominal aorta. No acute findings. Musculoskeletal: No focal abnormality. IMPRESSION: 1. Patchy hazy airspace opacification over the bilateral lower lobes which may be due to an infectious or inflammatory process. 2. 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 3. Aortic atherosclerosis. Mild atherosclerotic coronary artery disease. Aortic Atherosclerosis (ICD10-I70.0). Electronically Signed   By: Marin Olp M.D.   On: 01/03/2023 10:10   ECHOCARDIOGRAM COMPLETE  Result Date: 12/29/2022    ECHOCARDIOGRAM REPORT   Patient Name:   Gregory [name] Date of Exam: 12/29/2022 Medical Rec #:  [id number]        Height:       70.0 in Accession #:    [id number]       Weight:       179.0 lb Date of Birth:  June 18, 1941       BSA:  1.991 m Patient Age:    82 years         BP:           138/70 mmHg Patient Gender: M                HR:           94 bpm. Exam Location:  Inpatient Procedure: 2D  Echo, Cardiac Doppler, Color Doppler and 3D Echo Indications:    Pulmonary hypertension I27.2  History:        Patient has prior history of Echocardiogram examinations, most                 recent 06/04/2021. Risk Factors:Dyslipidemia. Interstitial lung                 diesase.  Sonographer:    Darlina Sicilian RDCS Referring Phys: [id number] [place]  1. Left ventricular ejection fraction, by estimation, is 55 to 60%. The left ventricle has normal function. The left ventricle has no regional wall motion abnormalities. There is mild left ventricular hypertrophy. Indeterminate diastolic filling due to E-A fusion.  2. Right ventricular systolic function is normal. The right ventricular size is normal. Tricuspid regurgitation signal is inadequate for assessing PA pressure.  3. The mitral valve is grossly normal. No evidence of mitral valve regurgitation. No evidence of mitral stenosis.  4. The aortic valve is tricuspid. Aortic valve regurgitation is not visualized. No aortic stenosis is present.  5. The inferior vena cava is normal in size with greater than 50% respiratory variability, suggesting right atrial pressure of 3 mmHg. FINDINGS  Left Ventricle: Left ventricular ejection fraction, by estimation, is 55 to 60%. The left ventricle has normal function. The left ventricle has no regional wall motion abnormalities. The left ventricular internal cavity size was normal in size. There is  mild left ventricular hypertrophy. Indeterminate diastolic filling due to E-A fusion. Right Ventricle: The right ventricular size is normal. No increase in right ventricular wall thickness. Right ventricular systolic function is normal. Tricuspid regurgitation signal is inadequate for assessing PA pressure. Left Atrium: Left atrial size was normal in size. Right Atrium: Right atrial size was normal in size. Pericardium: There is no evidence of pericardial effusion. Mitral Valve: The mitral valve is grossly normal. No  evidence of mitral valve regurgitation. No evidence of mitral valve stenosis. Tricuspid Valve: The tricuspid valve is grossly normal. Tricuspid valve regurgitation is not demonstrated. Aortic Valve: The aortic valve is tricuspid. Aortic valve regurgitation is not visualized. No aortic stenosis is present. Pulmonic Valve: The pulmonic valve was grossly normal. Pulmonic valve regurgitation is trivial. No evidence of pulmonic stenosis. Aorta: The aortic root is normal in size and structure. Venous: The inferior vena cava is normal in size with greater than 50% respiratory variability, suggesting right atrial pressure of 3 mmHg. IAS/Shunts: The atrial septum is grossly normal.  LEFT VENTRICLE PLAX 2D LVIDd:         4.85 cm   Diastology LVIDs:         2.90 cm   LV e' medial:    5.87 cm/s LV PW:         0.90 cm   LV E/e' medial:  11.2 LV IVS:        1.10 cm   LV e' lateral:   8.16 cm/s LVOT diam:     1.90 cm   LV E/e' lateral: 8.1 LV SV:         64  LV SV Index:   32 LVOT Area:     2.84 cm  RIGHT VENTRICLE RV S prime:     21.10 cm/s TAPSE (M-mode): 1.2 cm LEFT ATRIUM             Index        RIGHT ATRIUM           Index LA diam:        3.40 cm 1.71 cm/m   RA Area:     10.80 cm LA Vol (A2C):   36.4 ml 18.28 ml/m  RA Volume:   19.20 ml  9.64 ml/m LA Vol (A4C):   36.5 ml 18.33 ml/m LA Biplane Vol: 37.4 ml 18.78 ml/m  AORTIC VALVE LVOT Vmax:   113.00 cm/s LVOT Vmean:  73.900 cm/s LVOT VTI:    0.227 m  AORTA Ao Root diam: 3.40 cm MITRAL VALVE MV Area (PHT): 4.06 cm    SHUNTS MV Decel Time: 187 msec    Systemic VTI:  0.23 m MV E velocity: 66.00 cm/s  Systemic Diam: 1.90 cm MV A velocity: 99.90 cm/s MV E/A ratio:  0.66 Cherlynn Kaiser MD Electronically signed by Cherlynn Kaiser MD Signature Date/Time: 12/29/2022/5:12:13 PM    Final    DG Chest Port 1 View  Result Date: 12/28/2022 CLINICAL DATA:  82 year old male with left rib pain after fall. EXAM: PORTABLE CHEST 1 VIEW COMPARISON:  Portable chest 12/27/2022 and  earlier. FINDINGS: Portable AP semi upright view at 0652 hours. Mildly lower lung volumes. Mediastinal contours are stable and within normal limits. Visualized tracheal air column is within normal limits. Crowding of lung markings, underlying coarse chronic pulmonary interstitium. No pneumothorax, pleural effusion or consolidation. Osteopenic ribs. No acute osseous abnormality identified. Paucity of bowel gas in the upper abdomen. IMPRESSION: Lower lung volumes and chronic pulmonary interstitial changes. No acute cardiopulmonary abnormality or acute traumatic injury identified. Electronically Signed   By: Genevie Ann M.D.   On: 12/28/2022 07:30   DG Chest Port 1 View  Result Date: 12/27/2022 CLINICAL DATA:  Shortness of breath.  RSV EXAM: PORTABLE CHEST 1 VIEW COMPARISON:  Yesterday FINDINGS: Normal heart size and stable mediastinal contours. Stable interstitial coarsening. 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 No effusion or air bronchogram. IMPRESSION: Stable from prior.  No focal pneumonia. Electronically Signed   By: Jorje Guild M.D.   On: 12/27/2022 04:07   DG Chest 2 View  Result Date: 12/26/2022 CLINICAL DATA:  Shortness of breath EXAM: CHEST - 2 VIEW COMPARISON:  Chest x-ray 12/07/2020.  CT chest 08/17/2021. 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 There is no new lung infiltrate, pleural effusion or pneumothorax. The cardiomediastinal silhouette is within normal limits. No acute fractures are identified. IMPRESSION: No active cardiopulmonary disease. Electronically Signed   By: Ronney Asters M.D.   On: 12/26/2022 18:24   (Echo, Carotid, EGD, Colonoscopy, ERCP)    Subjective:  He is tolerating DVT on his current oxygen requirement, cough has improved. Discharge Exam: Vitals:   01/04/23 0927 01/04/23 1109  BP:  (!) 144/60  Pulse:  72  Resp:  17  Temp:  97.9 F (36.6 C)  SpO2: 95% 96%   Vitals:   01/04/23 0000 01/04/23  0355 01/04/23 0927 01/04/23 1109  BP: (!) 124/59 (!) 144/71  (!) 144/60  Pulse: 65 69  72  Resp: '17 18  17  '$ Temp: 98 F (36.7 C) 97.6 F (36.4 C)  97.9 F (36.6 C)  TempSrc:  Axillary  Axillary  SpO2: 97% 95% 95% 96%    General: Pt is alert, awake, not in acute distress Cardiovascular: RRR, S1/S2 +, no rubs, no gallops Respiratory: Coarse respiratory sounds, but good air entry.  Abdominal: Soft, NT, ND, bowel sounds + Extremities: no edema, no cyanosis    The results of significant diagnostics from this hospitalization (including imaging, microbiology, ancillary and laboratory) are listed below for reference.     Microbiology: Recent Results (from the past 240 hour(s))  Resp panel by RT-PCR (RSV, Flu A&B, Covid) Anterior Nasal Swab     Status: Abnormal   Collection Time: 12/26/22  5:31 PM   Specimen: Anterior Nasal Swab  Result Value Ref Range Status   SARS Coronavirus 2 by RT PCR NEGATIVE NEGATIVE Final    Comment: (NOTE) SARS-CoV-2 target nucleic acids are NOT DETECTED.  The SARS-CoV-2 RNA is generally detectable in upper respiratory specimens during the acute phase of infection. The lowest concentration of SARS-CoV-2 viral copies this assay can detect is 138 copies/mL. A negative result does not preclude SARS-Cov-2 infection and should not be used as the sole basis for treatment or other patient management decisions. A negative result may occur with  improper specimen collection/handling, submission of specimen other than nasopharyngeal swab, presence of viral mutation(s) within the areas targeted by this assay, and inadequate number of viral copies(<138 copies/mL). A negative result must be combined with clinical observations, patient history, and epidemiological information. The expected result is Negative.  Fact Sheet for Patients:  EntrepreneurPulse.com.au  Fact Sheet for Healthcare Providers:  IncredibleEmployment.be  This  test is no t yet approved or cleared by the [place] FDA and  has been authorized for detection and/or diagnosis of SARS-CoV-2 by FDA under an Emergency Use Authorization (EUA). This EUA will remain  in effect (meaning this test can be used) for the duration of the COVID-19 declaration under Section 564(b)(1) of the Act, 21 U.S.C.section 360bbb-3(b)(1), unless the authorization is terminated  or revoked sooner.       Influenza A by PCR NEGATIVE NEGATIVE Final   Influenza B by PCR NEGATIVE NEGATIVE Final    Comment: (NOTE) The Xpert Xpress SARS-CoV-2/FLU/RSV plus assay is intended as an aid in the diagnosis of influenza from Nasopharyngeal swab specimens and should not be used as a sole basis for treatment. Nasal washings and aspirates are unacceptable for Xpert Xpress SARS-CoV-2/FLU/RSV testing.  Fact Sheet for Patients: EntrepreneurPulse.com.au  Fact Sheet for Healthcare Providers: IncredibleEmployment.be  This test is not yet approved or cleared by the [place] FDA and has been authorized for detection and/or diagnosis of SARS-CoV-2 by FDA under an Emergency Use Authorization (EUA). This EUA will remain in effect (meaning this test can be used) for the duration of the COVID-19 declaration under Section 564(b)(1) of the Act, 21 U.S.C. section 360bbb-3(b)(1), unless the authorization is terminated or revoked.     Resp Syncytial Virus by PCR POSITIVE (A) NEGATIVE Final    Comment: (NOTE) Fact Sheet for Patients: EntrepreneurPulse.com.au  Fact Sheet for Healthcare Providers: IncredibleEmployment.be  This test is not yet approved or cleared by the [place] FDA and has been authorized for detection and/or diagnosis of SARS-CoV-2 by FDA under an Emergency Use Authorization (EUA). This EUA will remain in effect (meaning this test can be used) for the duration of the COVID-19 declaration under  Section 564(b)(1) of the Act, 21 U.S.C. section 360bbb-3(b)(1), unless the authorization is terminated or revoked.  Performed  at [facility], [address], [place], [place] [address]      Labs: BNP (last 3 results) Recent Labs    12/28/22 0439 12/29/22 0341 12/30/22 0548  BNP 136.5* 46.4 35.3   Basic Metabolic Panel: Recent Labs  Lab 12/29/22 0341 12/30/22 0548 12/31/22 0610 01/01/23 0149 01/02/23 0505 01/03/23 0413 01/04/23 0605  NA 125* 130* 127* 128* 127* 125* 128*  K 4.2 4.4 4.3 4.4 4.2 4.4 4.7  CL 90* 95* 93* 94* 94* 91* 91*  CO2 '25 26 24 25 26 23 29  '$ GLUCOSE 135* 90 85 106* 91 102* 86  BUN 24* '22 18 18 14 14 17  '$ CREATININE 1.02 0.90 0.80 0.82 0.76 0.82 0.85  CALCIUM 8.8* 8.4* 8.1* 8.5* 8.4* 8.4* 8.8*  MG 2.3 2.0  --   --   --   --   --    Liver Function Tests: No results for input(s): "AST", "ALT", "ALKPHOS", "BILITOT", "PROT", "ALBUMIN" in the last 168 hours. No results for input(s): "LIPASE", "AMYLASE" in the last 168 hours. No results for input(s): "AMMONIA" in the last 168 hours. CBC: Recent Labs  Lab 12/29/22 0341 12/30/22 0548  WBC 16.7* 15.7*  NEUTROABS 13.1* 10.4*  HGB 13.9 13.1  HCT 38.7* 38.2*  MCV 90.0 89.9  PLT 283 310   Cardiac Enzymes: No results for input(s): "CKTOTAL", "CKMB", "CKMBINDEX", "TROPONINI" in the last 168 hours. BNP: Invalid input(s): "POCBNP" CBG: No results for input(s): "GLUCAP" in the last 168 hours. D-Dimer No results for input(s): "DDIMER" in the last 72 hours. Hgb A1c No results for input(s): "HGBA1C" in the last 72 hours. Lipid Profile No results for input(s): "CHOL", "HDL", "LDLCALC", "TRIG", "CHOLHDL", "LDLDIRECT" in the last 72 hours. Thyroid function studies No results for input(s): "TSH", "T4TOTAL", "T3FREE", "THYROIDAB" in the last 72 hours.  Invalid input(s): "FREET3" Anemia work up No results for input(s): "VITAMINB12", "FOLATE", "FERRITIN", "TIBC", "IRON", "RETICCTPCT" in  the last 72 hours. Urinalysis    Component Value Date/Time   COLORURINE YELLOW 09/04/2021 0929   APPEARANCEUR CLEAR 09/04/2021 0929   LABSPEC 1.017 09/04/2021 0929   PHURINE 6.5 09/04/2021 0929   GLUCOSEU NEGATIVE 09/04/2021 0929   GLUCOSEU NEGATIVE 04/24/2019 0831   HGBUR NEGATIVE 09/04/2021 0929   BILIRUBINUR NEGATIVE 09/04/2021 0929   KETONESUR NEGATIVE 09/04/2021 0929   PROTEINUR TRACE (A) 09/04/2021 0929   UROBILINOGEN 0.2 04/24/2019 0831   NITRITE NEGATIVE 09/04/2021 0929   LEUKOCYTESUR NEGATIVE 09/04/2021 0929   Sepsis Labs Recent Labs  Lab 12/29/22 0341 12/30/22 0548  WBC 16.7* 15.7*   Microbiology Recent Results (from the past 240 hour(s))  Resp panel by RT-PCR (RSV, Flu A&B, Covid) Anterior Nasal Swab     Status: Abnormal   Collection Time: 12/26/22  5:31 PM   Specimen: Anterior Nasal Swab  Result Value Ref Range Status   SARS Coronavirus 2 by RT PCR NEGATIVE NEGATIVE Final    Comment: (NOTE) SARS-CoV-2 target nucleic acids are NOT DETECTED.  The SARS-CoV-2 RNA is generally detectable in upper respiratory specimens during the acute phase of infection. The lowest concentration of SARS-CoV-2 viral copies this assay can detect is 138 copies/mL. A negative result does not preclude SARS-Cov-2 infection and should not be used as the sole basis for treatment or other patient management decisions. A negative result may occur with  improper specimen collection/handling, submission of specimen other than nasopharyngeal swab, presence of viral mutation(s) within the areas targeted by this assay, and inadequate number of viral copies(<138 copies/mL). A  negative result must be combined with clinical observations, patient history, and epidemiological information. The expected result is Negative.  Fact Sheet for Patients:  EntrepreneurPulse.com.au  Fact Sheet for Healthcare Providers:  IncredibleEmployment.be  This test is no t yet  approved or cleared by the [place] FDA and  has been authorized for detection and/or diagnosis of SARS-CoV-2 by FDA under an Emergency Use Authorization (EUA). This EUA will remain  in effect (meaning this test can be used) for the duration of the COVID-19 declaration under Section 564(b)(1) of the Act, 21 U.S.C.section 360bbb-3(b)(1), unless the authorization is terminated  or revoked sooner.       Influenza A by PCR NEGATIVE NEGATIVE Final   Influenza B by PCR NEGATIVE NEGATIVE Final    Comment: (NOTE) The Xpert Xpress SARS-CoV-2/FLU/RSV plus assay is intended as an aid in the diagnosis of influenza from Nasopharyngeal swab specimens and should not be used as a sole basis for treatment. Nasal washings and aspirates are unacceptable for Xpert Xpress SARS-CoV-2/FLU/RSV testing.  Fact Sheet for Patients: EntrepreneurPulse.com.au  Fact Sheet for Healthcare Providers: IncredibleEmployment.be  This test is not yet approved or cleared by the [place] FDA and has been authorized for detection and/or diagnosis of SARS-CoV-2 by FDA under an Emergency Use Authorization (EUA). This EUA will remain in effect (meaning this test can be used) for the duration of the COVID-19 declaration under Section 564(b)(1) of the Act, 21 U.S.C. section 360bbb-3(b)(1), unless the authorization is terminated or revoked.     Resp Syncytial Virus by PCR POSITIVE (A) NEGATIVE Final    Comment: (NOTE) Fact Sheet for Patients: EntrepreneurPulse.com.au  Fact Sheet for Healthcare Providers: IncredibleEmployment.be  This test is not yet approved or cleared by the [place] FDA and has been authorized for detection and/or diagnosis of SARS-CoV-2 by FDA under an Emergency Use Authorization (EUA). This EUA will remain in effect (meaning this test can be used) for the duration of the COVID-19 declaration under Section 564(b)(1)  of the Act, 21 U.S.C. section 360bbb-3(b)(1), unless the authorization is terminated or revoked.  Performed at [facility], [address] [place], [place] [address]      Time coordinating discharge: Over 30 minutes  SIGNED:   Phillips Climes, MD  [facility] 01/04/2023, 11:45 AM Pager   If 7PM-7AM, please contact night-coverage [email or web address]

## 2023-01-04 NOTE — Progress Notes (Signed)
Reviewed d/c instructions with pt and wife, piv d/c, waiting on [facility] pharmacy to deliver meds. VS wnl at time of d/c, no distress.

## 2023-01-05 ENCOUNTER — Telehealth: Payer: Self-pay

## 2023-01-05 NOTE — Patient Outreach (Signed)
  Care Coordination TOC Note Transition Care Management Follow-up Telephone Call Date of discharge and from where: 01/04/23-[facility] How have you been since you were released from the hospital? Spoke with both spouse and patient who voices patient "doing pretty good." He is using oxygen as ordered. He has pulse ox machine in the home and readings have been 95%. He denies any SOB. States he has been up moving around and doing his normal routine today. Appetite good.  Any questions or concerns? Yes-Patent wanting to know the difference between his inhaler and nebulizer treatment. Educated patient on purpose and usage of both meds. He reports wife picked up meds today. He has not taken either yet.  Items Reviewed: Did the pt receive and understand the discharge instructions provided? Yes  Medications obtained and verified? Yes  Other? No  Any new allergies since your discharge? No  Dietary orders reviewed? Yes-low salt/heart healthy Do you have support at home? Yes -wife  Home Care and Equipment/Supplies: Were home health services ordered? not applicable If so, what is the name of the agency? N/A  Has the agency set up a time to come to the patient's home? not applicable Were any new equipment or medical supplies ordered?  Yes: oxygen,nebulizer What is the name of the medical supply agency? [employer] Were you able to get the supplies/equipment? yes Do you have any questions related to the use of the equipment or supplies? No  Functional Questionnaire: (I = Independent and D = Dependent) ADLs: A  Bathing/Dressing- A  Meal Prep- A  Eating- I  Maintaining continence- I  Transferring/Ambulation- I  Managing Meds- A  Follow up appointments reviewed:  PCP Hospital f/u appt confirmed? Yes  Scheduled to see Fransico Michael on 01/24/23 @ 10:15 am. Discharge instructions states to follow up in 7 days with PCP and have blood work drawn-will check to see if appt can be moved up [facility] f/u appt confirmed? No  Patient states he will call lung MD to make an appt. Are transportation arrangements needed? No  If their condition worsens, is the pt aware to call PCP or go to the Emergency Dept.? Yes Was the patient provided with contact information for the PCP's office or ED? Yes Was to pt encouraged to call back with questions or concerns? Yes  SDOH assessments and interventions completed:   Yes SDOH Interventions Today    Flowsheet Row Most Recent Value  SDOH Interventions   Food Insecurity Interventions Intervention Not Indicated  Transportation Interventions Intervention Not Indicated       Care Coordination Interventions:  PCP follow up appointment requested Education provided regarding resp mgmt and med usage    Encounter Outcome:  Pt. Visit Completed    Enzo Montgomery, RN,BSN,CCM [facility] [facility] Telephonic Care Management Coordinator Direct Phone: [phone] Toll Free: [phone] Fax: [phone]

## 2023-01-06 ENCOUNTER — Ambulatory Visit (INDEPENDENT_AMBULATORY_CARE_PROVIDER_SITE_OTHER): Payer: Medicare Other | Admitting: Nurse Practitioner

## 2023-01-06 ENCOUNTER — Encounter: Payer: Self-pay | Admitting: Nurse Practitioner

## 2023-01-06 VITALS — BP 124/68 | HR 80 | Ht 69.0 in | Wt 180.3 lb

## 2023-01-06 DIAGNOSIS — L989 Disorder of the skin and subcutaneous tissue, unspecified: Secondary | ICD-10-CM

## 2023-01-06 DIAGNOSIS — J9601 Acute respiratory failure with hypoxia: Secondary | ICD-10-CM

## 2023-01-06 DIAGNOSIS — L8990 Pressure ulcer of unspecified site, unspecified stage: Secondary | ICD-10-CM

## 2023-01-06 DIAGNOSIS — J849 Interstitial pulmonary disease, unspecified: Secondary | ICD-10-CM | POA: Diagnosis not present

## 2023-01-06 DIAGNOSIS — J121 Respiratory syncytial virus pneumonia: Secondary | ICD-10-CM

## 2023-01-06 DIAGNOSIS — J4530 Mild persistent asthma, uncomplicated: Secondary | ICD-10-CM

## 2023-01-06 MED ORDER — MUPIROCIN 2 % EX OINT: 1.0000 | TOPICAL_OINTMENT | Freq: Two times a day (BID) | CUTANEOUS | 0 refills | Status: DC

## 2023-01-06 NOTE — Patient Instructions (Addendum)
Continue Albuterol inhaler 2 puffs or 3 mL neb every 6 hours as needed for shortness of breath or wheezing. Notify if symptoms persist despite rescue inhaler/neb use. Use your nebulizer at least twice a day until symptoms resolve Continue benzonatate 1 capsule Three times a day for cough Continue gabapentin 1 capsule Three times a day. May cause drowsiness. Continue hycodan cough syrup 5 mL every 8 hours as needed for cough. May cause drowsiness. Do not drive after taking Continue Xyzal 1 tab daily Continue montelukast (singulair) 1 tab At bedtime Continue pantoprazole 1 tab daily  Complete prednisone taper as previously prescribed Continue supplemental oxygen 5 lpm with activity. Ok to leave oxygen off for brief periods when you are at rest if your oxygen is above 88-90% Apply mupirocin ointment Twice daily to sore on the bridge of your nose. Cover with a nonstick dressing  Follow up in 4 weeks with Dr. Silas Flood. If symptoms do not improve or worsen, please contact office for sooner follow up or seek emergency care.

## 2023-01-06 NOTE — Progress Notes (Signed)
$'@Patient'w$  ID: Gregory [name], male    DOB: [birth year], 82 y.o.   MRN: [id number]  Chief Complaint  Patient presents with   Follow-up    Pt HFU he reports he is feeling better with some mucous that is clear. Some SOB as well, currently using 5L O2.     Referring provider: [name], NP  HPI: 82 year old male, never smoker followed for chronic cough, pulmonary arteriovenous malformation, ILD. He is a patient of Dr. Kavin Leech and last seen in office 09/16/2022. Past medical history significant for Osler Weber Rendu disease, HTN, CHF, PAF, GERD, hypothyroid, hx of SIADH, BPH, IDA, HLD.   He was recently admitted from 12/27/2022 to 01/04/2023 due to acute respiratory failure related to RSV pneumonia.  He had significant oxygen requirements upon admission.  He was treated with steroids, diuretics, bronchodilators, and aggressive pulmonary toileting.  PCCM was consulted for further evaluation.  He had a CT of the chest which was significant for underlying interstitial lung disease but it was difficult to pronounce any progression.  He had clinical improvement and was discharged home on 5 L supplemental O2 via facemask as he is unable to tolerate nasal cannula due to previous nasal surgeries.  He also was instructed to complete prednisone taper.  TEST/EVENTS:  07/31/2021 PFT: FVC 72, FEV1 81, ratio 84, TLC 72, DLCOcor 43.  No BD 08/17/2021 CT chest with contrast: Central pulmonary vasculature with signs of large right lower lobe pulmonary AVM, stable when compared to previous.  Mildly patulous esophagus.  No LAD.  Mild groundglass and basilar atelectasis at the lung bases, similar to previous.  Mild subpleural reticulation and biapical scarring is stable.  Airways are patent.  Atherosclerosis. 12/29/2022 echo: EF 55 to 60%.  Mild LVH.  RV size and function is normal.  Unable to measure PASP.  No valvular abnormalities. 01/03/2023 CT chest without contrast: Atherosclerosis.  Lungs well-inflated.  Subtle  bilateral increased peripheral interstitial markings.  Stable biapical pleural thickening.  Stable right lower lobe AVM.  Patchy hazy airspace opacification over the bilateral lower lobes.  09/16/2021: OV with Dr. Silas Flood.  At previous visit, he was complaining of dyspnea and presyncope.  Concern for growth of AVM in the lung and increased right to left shunting leading to hypoxemia.  He had a CT chest with contrast showing stable AVM largest in the right lower lobe.  It also showed chronic posterior/dependent areas of groundglass without frank honeycombing or interlobular septal thickening.  Findings were stable compared to 2019.  Pulmonary function testing was obtained which showed normal spirometry, mild restriction with severely reduced DLCO.  CBC was checked and he was found to have a hemoglobin of 6.8.  He was started on iron supplementation and received blood transfusion.  Symptoms are improving now.  Chronic cough felt to be multifactorial.  Improved with Dulera in the past although more recently Breo did not help.  Wakes question of cough variant asthma.  Did not improve with high-dose Dulera or low-dose gabapentin.  Would consider increasing gabapentin in the future although he did have presyncope with higher dose in the past.  DOE likely multifactorial as well.  Worsening in summer 2022 related to anemia, hemoglobin found to be 6.8; likely due to chronic GI losses in the setting of his AVM.  01/07/2023: Today-follow-up Patient presents today with his wife for hospital follow-up.  He tells me that he is feeling significantly better compared to when he was admitted.  Feels like he is  slowly regaining his strength back.  Does not feel like his breathing is quite back to normal.  He did try to wean himself off oxygen at home and desaturated to the mid 80s with exertion.  He was able to maintain greater than 90% on room air at rest.  Cough is minimally productive with clear sputum.  Gets a little bit  worse at night.  Denies any wheezing, fevers, chills, hemoptysis, lower extremity swelling, orthopnea.  He is on on prednisone taper.  Does have benzonatate and Hycodan cough syrup at home.  He has not started using his neb treatments again since he was discharged. He does have two small sores on the bridge of his nose from the simple mask.  One is open and the other is red but the skin is closed.  No purulent drainage, edema. Has started putting a Band-Aid on it, which helps to decrease the rubbing.  Allergies  Allergen Reactions   Nsaids Other (See Comments)    Cannot take-per MD   Other Other (See Comments)    All blood thinners-cannot take per MD   Aspirin Other (See Comments)    nosebleeds   Statins Other (See Comments)    Weakness, myalgias   Demeclocycline Other (See Comments)    Passed out   Dust Mite Extract Other (See Comments)   Tizanidine Hcl Other (See Comments)    Patient stated that after taking this medication he experienced feelings of dizziness and disorientation.     Immunization History  Administered Date(s) Administered   COVID-19, mRNA, vaccine(Comirnaty)12 years and older 10/06/2022   Fluad Quad(high Dose 65+) 08/24/2019, 10/02/2020, 09/11/2021   Influenza Split 09/21/2011, 09/27/2012   Influenza Whole 09/11/2008, 09/11/2009, 09/22/2010   Influenza, High Dose Seasonal PF 09/09/2016, 09/16/2017, 10/03/2018   Influenza, Quadrivalent, Recombinant, Inj, Pf 08/20/2022   Influenza,inj,Quad PF,6+ Mos 10/04/2013, 10/08/2014, 10/02/2015   PFIZER Comirnaty(Gray Top)Covid-19 Tri-Sucrose Vaccine 06/08/2021   PFIZER(Purple Top)SARS-COV-2 Vaccination 01/03/2020, 01/23/2020, 10/18/2020   PNEUMOCOCCAL CONJUGATE-20 04/26/2022   Pfizer Covid-19 Vaccine Bivalent Booster 39yr & up 10/21/2021   Pneumococcal Conjugate-13 12/07/2016   Pneumococcal Polysaccharide-23 03/12/2010, 06/27/2018   Tdap 10/04/2013   Varicella 12/20/2009   Zoster Recombinat (Shingrix) 06/24/2022,  10/25/2022   Zoster, Live 09/06/2022    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  WITHOUT HEMOPTYSIS   History of nonmelanoma skin cancer EXCISION SQUAMOUS CELL FROM HAND   Hypertrophy of prostate with urinary obstruction and other lower urinary tract symptoms (LUTS)    ILD (interstitial lung disease) (HCC)    Irritable bowel syndrome    Nocturnal leg cramps 06/11/2013   Plantar fascial fibromatosis    Pure hypercholesterolemia    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     albuterol (PROVENTIL) (2.5 MG/3ML) 0.083% nebulizer solution Take 3 mLs (2.5 mg total) by nebulization every 6 (six) hours as needed for wheezing. 75 mL 0   albuterol (VENTOLIN HFA) 108 (90 Base) MCG/ACT inhaler Inhale 2 puffs into the lungs every 6 (six) hours as needed for wheezing or shortness of breath. 8 g 0   Ascorbic Acid (VITAMIN C) 1000 MG tablet Take 500 mg by mouth daily.     benzonatate (TESSALON) 200 MG capsule Take 1 capsule (200 mg total) by mouth 3 (three) times daily as needed for cough. 45 capsule 0   Cholecalciferol 125 MCG (5000 UT) capsule Take 5,000 Units by mouth daily.     clidinium-chlordiazePOXIDE (LIBRAX) 5-2.5  MG capsule Take 1 capsule by mouth 3 (three) times daily before meals. (Patient taking differently: Take 1 capsule by mouth daily as needed (IBS).) 90 capsule 3   clorazepate (TRANXENE-T) 7.5 MG tablet Take 1 tablet (7.5 mg total) by mouth at bedtime as needed for anxiety. 30 tablet 3   docusate sodium (COLACE) 100 MG capsule Take 200 mg by mouth at bedtime.     fluorometholone (FML) 0.1 % ophthalmic suspension Place 1 drop into both eyes 2 (two) times daily.     fluorouracil (EFUDEX) 5 % cream daily     gabapentin (NEURONTIN) 100 MG capsule Take 1 capsule (100 mg total) by mouth 2 (two) times daily. 180 each 3   HYDROcodone bit-homatropine (HYCODAN) 5-1.5 MG/5ML syrup Take 5 mLs by mouth every 8 (eight) hours as needed for cough. 120 mL 0   Iron, Ferrous Sulfate, 325 (65 Fe) MG TABS Take 325 mg by mouth in the morning, at noon, and at bedtime. 30 tablet 2   levocetirizine (XYZAL) 5 MG tablet Take 1 tablet (5 mg total) by mouth every evening. 90 tablet 1   levothyroxine (SYNTHROID) 150 MCG tablet Take 1 tablet (150 mcg total) by mouth daily before breakfast. 90 tablet 3   magic mouthwash (nystatin, hydrocortisone, diphenhydrAMINE) suspension Swish and swallow 5 mLs 3 (three) times daily as needed (mouth and throat irritation). (Patient taking differently: Swish and swallow 5 mLs 3 (three) times daily as needed for mouth pain.) 480 mL 3   Magnesium 250 MG TABS Take 250 mg by mouth at bedtime.      meclizine (ANTIVERT) 25 MG tablet Take 1 tablet (25 mg total) by mouth 2 (two) times daily as needed for dizziness. 30 tablet 6   methocarbamol (ROBAXIN) 500 MG tablet Take 1 tablet (500 mg total) by mouth every 8 (eight) hours as needed for muscle spasms. 270 tablet 0   montelukast (SINGULAIR) 10 MG tablet TAKE 1 TABLET BY MOUTH AT  BEDTIME (Patient taking differently: Take 10 mg by mouth at bedtime.) 90 tablet 3   Multiple Vitamin (MULTIVITAMIN WITH MINERALS) TABS tablet Take 1 tablet by mouth every  morning.     pantoprazole (PROTONIX) 40 MG tablet TAKE 1 TABLET BY MOUTH TWICE  DAILY (Patient taking differently: Take 40 mg by mouth daily.) 180 tablet 3   predniSONE (DELTASONE) 10 MG tablet Take 4 tablets (40 mg) by mouth daily for 3 days, then 3 tablets (30 mg) daily for 3 days, then 2 tablets (20 mg) daily for 3 days, then 1 tablet (10 mg) daily for 3 days. Start on 01/17 (Wednesday) per MD 30 tablet 0   silodosin (RAPAFLO) 8 MG CAPS capsule Take 1 capsule (8 mg total) by mouth  at bedtime. TAKE ONE CAPSULE BY MOUTH DAILY AT BEDTIME 90 capsule 1   valsartan (DIOVAN) 40 MG tablet TAKE 1 TABLET BY MOUTH EVERY DAY (Patient taking differently: Take 40 mg by mouth daily.) 90 tablet 1   No facility-administered medications prior to visit.     Review of Systems:   Constitutional: No weight loss or gain, night sweats, fevers, chills. +fatigue, lassitude (improving) HEENT: No headaches, difficulty swallowing, tooth/dental problems, or sore throat. No sneezing, itching, ear ache, nasal congestion, or post nasal drip CV:  No chest pain, orthopnea, PND, swelling in lower extremities, anasarca, dizziness, palpitations, syncope Resp: +shortness of breath with exertion (improving); improving productive cough. No hemoptysis. No wheezing.  No chest wall deformity GI:  No heartburn, indigestion, abdominal pain, nausea, vomiting, diarrhea, loss of appetite GU: No dysuria, change in color of urine, urgency or frequency.  No flank pain, no hematuria  Skin: +lesion x 2 on bride of nose; no exudate. No rash  MSK:  No joint pain or swelling.   Neuro: No dizziness or lightheadedness.  Psych: No depression or anxiety. Mood stable.     Physical Exam:  BP 124/68   Pulse 80   Ht '5\' 9"'$  (1.753 m)   Wt 180 lb 4.8 oz (81.8 kg)   SpO2 93% Comment: 5L  BMI 26.63 kg/m   GEN: Pleasant, interactive, well-kempt; chronically-ill appearing; in no acute distress. HEENT:  Normocephalic and atraumatic. PERRLA. Sclera  white. Nasal turbinates pink, moist and patent bilaterally. No rhinorrhea present. Oropharynx pink and moist, without exudate or edema. No lesions, ulcerations, or postnasal drip.  NECK:  Supple w/ fair ROM. No JVD present. Normal carotid impulses w/o bruits. Thyroid symmetrical with no goiter or nodules palpated. No lymphadenopathy.   CV: RRR, no m/r/g, no peripheral edema. Pulses intact, +2 bilaterally. No cyanosis, pallor or clubbing. PULMONARY:  Unlabored, regular breathing. Clear bilaterally A&P w/o wheezes/rales/rhonchi. No accessory muscle use.  GI: BS present and normoactive. Soft, non-tender to palpation. No organomegaly or masses detected.  MSK: No erythema, warmth or tenderness. Cap refil <2 sec all extrem. No deformities or joint swelling noted.  Neuro: A/Ox3. No focal deficits noted.   Skin: 5 mm lesion to bridge of nose, blanchable, skin intact. 3 mm open lesion to bridge of nose, erythematous, non-blanchable; no exudate, warmth.  Psych: Normal affect and behavior. Judgement and thought content appropriate.     Lab Results:  CBC    Component Value Date/Time   WBC 15.7 (H) 12/30/2022 0548   RBC 4.25 12/30/2022 0548   HGB 13.1 12/30/2022 0548   HGB 14.8 12/24/2022 0953   HCT 38.2 (L) 12/30/2022 0548   PLT 310 12/30/2022 0548   PLT 245 12/24/2022 0953   MCV 89.9 12/30/2022 0548   MCH 30.8 12/30/2022 0548   MCHC 34.3 12/30/2022 0548   RDW 12.8 12/30/2022 0548   LYMPHSABS 3.3 12/30/2022 0548   MONOABS 1.8 (H) 12/30/2022 0548   EOSABS 0.0 12/30/2022 0548   BASOSABS 0.0 12/30/2022 0548    BMET    Component Value Date/Time   NA 128 (L) 01/04/2023 0605   NA 133 (L) 10/28/2022 1400   K 4.7 01/04/2023 0605   CL 91 (L) 01/04/2023 0605   CO2 29 01/04/2023 0605   GLUCOSE 86 01/04/2023 0605   BUN 17 01/04/2023 0605   BUN 13 10/28/2022 1400   CREATININE 0.85 01/04/2023 0605   CALCIUM 8.8 (L) 01/04/2023 0605   GFRNONAA >60 01/04/2023 0605   GFRAA >60  06/01/2020 1106     BNP    Component Value Date/Time   BNP 37.1 12/30/2022 0548     Imaging:  CT CHEST WO CONTRAST  Result Date: 01/03/2023 CLINICAL DATA:  Follow-up interstitial lung disease. Hypoxia. History of Osler Weber Rendu syndrome. EXAM: CT CHEST WITHOUT CONTRAST TECHNIQUE: Multidetector CT imaging of the chest was performed following the standard protocol without IV contrast. RADIATION DOSE REDUCTION: This exam was performed according to the departmental dose-optimization program which includes automated exposure control, adjustment of the mA and/or kV according to patient size and/or use of iterative reconstruction technique. COMPARISON:  08/17/2021 and 12/07/2020 FINDINGS: Cardiovascular: Heart is normal size. Mild calcified plaque in the region of the left main coronary artery. Thoracic aorta is normal in caliber. Minimal calcified plaque over the descending thoracic aorta. Main and proximal pulmonary arteries are normal. Mediastinum/Nodes: No mediastinal or hilar adenopathy. Remaining mediastinal structures are unremarkable. Lungs/Pleura: Lungs are well inflated. Subtle stable bilateral increased peripheral interstitial markings. Stable biapical pleural thickening with minimal associated pleural calcification. 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 Patchy hazy airspace opacification over the bilateral lower lobes which may be due to infectious or inflammatory process. No effusion. Airways are unremarkable Upper Abdomen: Mild calcified plaque over the abdominal aorta. No acute findings. Musculoskeletal: No focal abnormality. IMPRESSION: 1. Patchy hazy airspace opacification over the bilateral lower lobes which may be due to an infectious or inflammatory process. 2. 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 3. Aortic atherosclerosis. Mild atherosclerotic coronary artery disease. Aortic Atherosclerosis (ICD10-I70.0). Electronically Signed   By: Marin Olp M.D.   On: 01/03/2023 10:10    ECHOCARDIOGRAM COMPLETE  Result Date: 12/29/2022    ECHOCARDIOGRAM REPORT   Patient Name:   CIRILO CANNER Date of Exam: 12/29/2022 Medical Rec #:  [id number]        Height:       70.0 in Accession #:    [id number]       Weight:       179.0 lb Date of Birth:  28-Feb-1941       BSA:          1.991 m Patient Age:    79 years         BP:           138/70 mmHg Patient Gender: M                HR:           94 bpm. Exam Location:  Inpatient Procedure: 2D Echo, Cardiac Doppler, Color Doppler and 3D Echo Indications:    Pulmonary hypertension I27.2  History:        Patient has prior history of Echocardiogram examinations, most                 recent 06/04/2021. Risk Factors:Dyslipidemia. Interstitial lung                 diesase.  Sonographer:    Darlina Sicilian RDCS Referring Phys: [id number] [place]  1. Left ventricular ejection fraction, by estimation, is 55 to 60%. The left ventricle has normal function. The left ventricle has no regional wall motion abnormalities. There is mild left ventricular hypertrophy. Indeterminate diastolic filling due to E-A fusion.  2. Right ventricular systolic function is normal. The right ventricular size is normal. Tricuspid regurgitation signal is inadequate for assessing PA pressure.  3. The mitral valve is grossly normal. No evidence of mitral valve regurgitation. No evidence  of mitral stenosis.  4. The aortic valve is tricuspid. Aortic valve regurgitation is not visualized. No aortic stenosis is present.  5. The inferior vena cava is normal in size with greater than 50% respiratory variability, suggesting right atrial pressure of 3 mmHg. FINDINGS  Left Ventricle: Left ventricular ejection fraction, by estimation, is 55 to 60%. The left ventricle has normal function. The left ventricle has no regional wall motion abnormalities. The left ventricular internal cavity size was normal in size. There is  mild left ventricular hypertrophy. Indeterminate diastolic filling  due to E-A fusion. Right Ventricle: The right ventricular size is normal. No increase in right ventricular wall thickness. Right ventricular systolic function is normal. Tricuspid regurgitation signal is inadequate for assessing PA pressure. Left Atrium: Left atrial size was normal in size. Right Atrium: Right atrial size was normal in size. Pericardium: There is no evidence of pericardial effusion. Mitral Valve: The mitral valve is grossly normal. No evidence of mitral valve regurgitation. No evidence of mitral valve stenosis. Tricuspid Valve: The tricuspid valve is grossly normal. Tricuspid valve regurgitation is not demonstrated. Aortic Valve: The aortic valve is tricuspid. Aortic valve regurgitation is not visualized. No aortic stenosis is present. Pulmonic Valve: The pulmonic valve was grossly normal. Pulmonic valve regurgitation is trivial. No evidence of pulmonic stenosis. Aorta: The aortic root is normal in size and structure. Venous: The inferior vena cava is normal in size with greater than 50% respiratory variability, suggesting right atrial pressure of 3 mmHg. IAS/Shunts: The atrial septum is grossly normal.  LEFT VENTRICLE PLAX 2D LVIDd:         4.85 cm   Diastology LVIDs:         2.90 cm   LV e' medial:    5.87 cm/s LV PW:         0.90 cm   LV E/e' medial:  11.2 LV IVS:        1.10 cm   LV e' lateral:   8.16 cm/s LVOT diam:     1.90 cm   LV E/e' lateral: 8.1 LV SV:         64 LV SV Index:   32 LVOT Area:     2.84 cm  RIGHT VENTRICLE RV S prime:     21.10 cm/s TAPSE (M-mode): 1.2 cm LEFT ATRIUM             Index        RIGHT ATRIUM           Index LA diam:        3.40 cm 1.71 cm/m   RA Area:     10.80 cm LA Vol (A2C):   36.4 ml 18.28 ml/m  RA Volume:   19.20 ml  9.64 ml/m LA Vol (A4C):   36.5 ml 18.33 ml/m LA Biplane Vol: 37.4 ml 18.78 ml/m  AORTIC VALVE LVOT Vmax:   113.00 cm/s LVOT Vmean:  73.900 cm/s LVOT VTI:    0.227 m  AORTA Ao Root diam: 3.40 cm MITRAL VALVE MV Area (PHT): 4.06 cm     SHUNTS MV Decel Time: 187 msec    Systemic VTI:  0.23 m MV E velocity: 66.00 cm/s  Systemic Diam: 1.90 cm MV A velocity: 99.90 cm/s MV E/A ratio:  0.66 Cherlynn Kaiser MD Electronically signed by Cherlynn Kaiser MD Signature Date/Time: 12/29/2022/5:12:13 PM    Final    DG Chest Port 1 View  Result Date: 12/28/2022 CLINICAL DATA:  82 year old male with left rib  pain after fall. EXAM: PORTABLE CHEST 1 VIEW COMPARISON:  Portable chest 12/27/2022 and earlier. FINDINGS: Portable AP semi upright view at 0652 hours. Mildly lower lung volumes. Mediastinal contours are stable and within normal limits. Visualized tracheal air column is within normal limits. Crowding of lung markings, underlying coarse chronic pulmonary interstitium. No pneumothorax, pleural effusion or consolidation. Osteopenic ribs. No acute osseous abnormality identified. Paucity of bowel gas in the upper abdomen. IMPRESSION: Lower lung volumes and chronic pulmonary interstitial changes. No acute cardiopulmonary abnormality or acute traumatic injury identified. Electronically Signed   By: Genevie Ann M.D.   On: 12/28/2022 07:30   DG Chest Port 1 View  Result Date: 12/27/2022 CLINICAL DATA:  Shortness of breath.  RSV EXAM: PORTABLE CHEST 1 VIEW COMPARISON:  Yesterday FINDINGS: Normal heart size and stable mediastinal contours. Stable interstitial coarsening. 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 No effusion or air bronchogram. IMPRESSION: Stable from prior.  No focal pneumonia. Electronically Signed   By: Jorje Guild M.D.   On: 12/27/2022 04:07   DG Chest 2 View  Result Date: 12/26/2022 CLINICAL DATA:  Shortness of breath EXAM: CHEST - 2 VIEW COMPARISON:  Chest x-ray 12/07/2020.  CT chest 08/17/2021. 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 There is no new lung infiltrate, pleural effusion or pneumothorax. The cardiomediastinal silhouette is within normal limits. No acute fractures  are identified. IMPRESSION: No active cardiopulmonary disease. Electronically Signed   By: Ronney Asters M.D.   On: 12/26/2022 18:24         Latest Ref Rng & Units 07/31/2021   10:49 AM  PFT Results  FVC-Pre L 2.90   FVC-Predicted Pre % 72   FVC-Post L 2.81   FVC-Predicted Post % 69   Pre FEV1/FVC % % 81   Post FEV1/FCV % % 84   FEV1-Pre L 2.34   FEV1-Predicted Pre % 81   FEV1-Post L 2.38   DLCO uncorrected ml/min/mmHg 10.47   DLCO UNC% % 43   DLCO corrected ml/min/mmHg 10.47   DLCO COR %Predicted % 43   DLVA Predicted % 57   TLC L 5.07   TLC % Predicted % 72   RV % Predicted % 85     No results found for: "NITRICOXIDE"      Assessment & Plan:   RSV (respiratory syncytial virus pneumonia) Admitted from 12/27/2022 to 01/04/2023 due to acute respiratory failure related to RSV pneumonia.  Slowly recovering but clinically improving.  Still requiring supplemental oxygen with exertion; able to maintain on room air at rest. Encouraged him to restart bronchodilator regimen. Complete steroids as directed. Target cough control measures to limit further inflammation/irritation. Strict return precautions.   Patient Instructions  Continue Albuterol inhaler 2 puffs or 3 mL neb every 6 hours as needed for shortness of breath or wheezing. Notify if symptoms persist despite rescue inhaler/neb use. Use your nebulizer at least twice a day until symptoms resolve Continue benzonatate 1 capsule Three times a day for cough Continue gabapentin 1 capsule Three times a day. May cause drowsiness. Continue hycodan cough syrup 5 mL every 8 hours as needed for cough. May cause drowsiness. Do not drive after taking Continue Xyzal 1 tab daily Continue montelukast (singulair) 1 tab At bedtime Continue pantoprazole 1 tab daily  Complete prednisone taper as previously prescribed Continue supplemental oxygen 5 lpm with activity. Ok to leave oxygen off for brief periods when you are at rest if your oxygen  is  above 88-90% Apply mupirocin ointment Twice daily to sore on the bridge of your nose. Cover with a nonstick dressing  Follow up in 4 weeks with Dr. Silas Flood. If symptoms do not improve or worsen, please contact office for sooner follow up or seek emergency care.    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 Did not notice previous response with Breo inhaler. May retrial upon his return. For now, he will continue nebulized therapies. Asthma action plan in place.   Interstitial lung disease ([place]) Unclear whether there has been progression of disease vs superimposed infectious/inflammatory process. Plan to repeat imaging in 6 weeks for further evaluation. May consider repeating PFTs. See above plan.  Acute respiratory failure with hypoxia (HCC) Slow to resolve. Unclear if we will be able to wean him off entirely due to his underlying lung condition. He was able to maintain sats >90% at rest today. Still requiring 5 lpm via simpl mask with exertion. Plan to continue monitoring and walking again at follow up. Goal >88-90%.  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 No evidence of infection. Rx bactroban to be applied to open lesion. Educated on proper cleaning/care. He will try to adjust his simple mask more often. He will obtain non-adherent dressing from the pharmacy. May need to refer to wound care if no improvement or if any worsening occurs.   I spent 45 minutes of dedicated to the care of this patient on the date of this encounter to include pre-visit review of records, face-to-face time with the patient discussing conditions above, post visit ordering of testing, clinical documentation with the electronic health record, making appropriate referrals as documented, and communicating necessary findings to members of the patients care team.  Clayton Bibles, NP 01/07/2023  Pt aware and understands  NP's role.

## 2023-01-07 ENCOUNTER — Encounter: Payer: Self-pay | Admitting: Nurse Practitioner

## 2023-01-07 ENCOUNTER — Ambulatory Visit (INDEPENDENT_AMBULATORY_CARE_PROVIDER_SITE_OTHER): Payer: Medicare Other | Admitting: Nurse Practitioner

## 2023-01-07 VITALS — BP 120/62 | HR 80 | Temp 98.5°F | Ht 62.5 in | Wt 184.2 lb

## 2023-01-07 DIAGNOSIS — J121 Respiratory syncytial virus pneumonia: Secondary | ICD-10-CM

## 2023-01-07 DIAGNOSIS — J9600 Acute respiratory failure, unspecified whether with hypoxia or hypercapnia: Secondary | ICD-10-CM | POA: Insufficient documentation

## 2023-01-07 DIAGNOSIS — I5032 Chronic diastolic (congestive) heart failure: Secondary | ICD-10-CM

## 2023-01-07 DIAGNOSIS — I10 Essential (primary) hypertension: Secondary | ICD-10-CM

## 2023-01-07 DIAGNOSIS — Z9981 Dependence on supplemental oxygen: Secondary | ICD-10-CM

## 2023-01-07 DIAGNOSIS — G620 Drug-induced polyneuropathy: Secondary | ICD-10-CM

## 2023-01-07 DIAGNOSIS — J849 Interstitial pulmonary disease, unspecified: Secondary | ICD-10-CM | POA: Diagnosis not present

## 2023-01-07 DIAGNOSIS — Z09 Encounter for follow-up examination after completed treatment for conditions other than malignant neoplasm: Secondary | ICD-10-CM | POA: Diagnosis not present

## 2023-01-07 DIAGNOSIS — E222 Syndrome of inappropriate secretion of antidiuretic hormone: Secondary | ICD-10-CM

## 2023-01-07 DIAGNOSIS — L8990 Pressure ulcer of unspecified site, unspecified stage: Secondary | ICD-10-CM | POA: Insufficient documentation

## 2023-01-07 DIAGNOSIS — J9601 Acute respiratory failure with hypoxia: Secondary | ICD-10-CM | POA: Insufficient documentation

## 2023-01-07 HISTORY — DX: Pressure ulcer of unspecified site, unspecified stage: L89.90

## 2023-01-07 HISTORY — DX: Acute respiratory failure with hypoxia: J96.01

## 2023-01-07 HISTORY — DX: Respiratory syncytial virus pneumonia: J12.1

## 2023-01-07 HISTORY — DX: Acute respiratory failure, unspecified whether with hypoxia or hypercapnia: J96.00

## 2023-01-07 NOTE — Assessment & Plan Note (Signed)
Slow to resolve. Unclear if we will be able to wean him off entirely due to his underlying lung condition. He was able to maintain sats >90% at rest today. Still requiring 5 lpm via simpl mask with exertion. Plan to continue monitoring and walking again at follow up. Goal >88-90%.

## 2023-01-07 NOTE — Progress Notes (Signed)
[name], DNP, AGNP-c [place],  [id number] [phone]  Subjective:   Gregory [name] is a 82 y.o. male presents to day for evaluation of: Hospital Follow-Up [name] was recently hospitalized for complications associated with RSV including acute respiratory failure. Since discharge he tells me he has been doing well and is improving. He is currently on home O2 to help keep his saturations up and he is maintaining in the mid 90's. He tells me he is eating well and having normal BM's. He is not having any new or worsening shortness of breath. He did follow-up with pulmonology and they are working to start to wean him off of the home O2. He tells me he is still regaining his strength, but is definitely improving. He is anxious to return to his normal activities.   PMH, Medications, and Allergies reviewed and updated in chart as appropriate.   ROS negative except for what is listed in HPI. Objective:  BP 120/62   Pulse 80   Temp 98.5 F (36.9 C)   Ht 5' 2.5" (1.588 m)   Wt 184 lb 3.2 oz (83.6 kg)   SpO2 94%   BMI 33.15 kg/m  Physical Exam Vitals and nursing note reviewed.  Constitutional:      General: He is not in acute distress.    Appearance: Normal appearance.  HENT:     Head: Normocephalic and atraumatic.  Eyes:     Extraocular Movements: Extraocular movements intact.     Pupils: Pupils are equal, round, and reactive to light.  Neck:     Vascular: No carotid bruit.  Cardiovascular:     Rate and Rhythm: Normal rate and regular rhythm.     Pulses: Normal pulses.     Heart sounds: Normal heart sounds.  Pulmonary:     Effort: Pulmonary effort is normal. No respiratory distress.     Breath sounds: Wheezing present. No rhonchi.  Chest:     Chest wall: No tenderness.  Abdominal:     General: Bowel sounds are normal. There is no distension.     Palpations: Abdomen is soft.     Tenderness: There is no abdominal tenderness.  There is no right CVA tenderness, left CVA tenderness or guarding.  Musculoskeletal:     Right lower leg: No edema.     Left lower leg: No edema.  Lymphadenopathy:     Cervical: No cervical adenopathy.  Skin:    General: Skin is warm and dry.     Capillary Refill: Capillary refill takes less than 2 seconds.  Neurological:     General: No focal deficit present.     Mental Status: He is alert and oriented to person, place, and time.     Motor: Weakness present.  Psychiatric:        Mood and Affect: Mood normal.        Behavior: Behavior normal.        Thought Content: Thought content normal.        Judgment: Judgment normal.           Assessment & Plan:   Problem List Items Addressed This Visit     Essential hypertension (Chronic)    Chronic. Stable. No alarm symptoms present at this time.       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 No recent exacerbation with hospitalization. He is experiencing generalized weakness, which may increase sedation with medication. Monitor closely for sedation and respiratory  depression with use.       SIADH (syndrome of inappropriate ADH production) (HCC)    Chronic. Review of recent labs show stable at this time. Will continue to monitor closely.       Chronic diastolic heart failure (HCC)    No alarm symptoms present at this time. No signs of fluid volume overload present today. He is on home O2, but is weaning off of this with the help of pulmonology. No concerns present at this time. Will monitor.       Interstitial lung disease (HCC)    Chronic. Likely contributing factor to recent severe symptoms associated with RSV infection. He is doing well at this time, however, still on supplemental O2. Hopeful that he can wean off of this in the near future. Pulmonology planning to repeat imaging in the near future to assess if progression of disease is present. Will continue to monitor.       RSV (respiratory syncytial virus pneumonia)     Recent hospital discharge for RSV Pna. Currently on home O2 for continued lower O2 saturations while up and around. He is improving.  Recommend continue with pulmonology instructions. O2 at 5L [place]. Sat goals 88-90 ok on room air. If less than 88% recommend O2.  Will continue to monitor      Hospital discharge follow-up - Primary    Recent hospitalization for RSV Pna. Continues on supplemental O2 at 5L [place] when moving. Goal sats >88% on RA and >90% on O2. Patient appears to be improving and looks well today. We may need to consider PT once he has weaned off of his O2 to help with stamina. Will continue to monitor closely. Medications reviewed. No changes today.       On home O2    5L [place] with goal >90% saturation when moving around. Currently weaning with pulmonology. Doing very well          [name], DNP, AGNP-c 01/18/2023  8:06 PM    History, Medications, Surgery, SDOH, and Family History reviewed and updated as appropriate.

## 2023-01-07 NOTE — Patient Instructions (Signed)
If anything changes, please call me. Right now you are looking really good.

## 2023-01-07 NOTE — Assessment & Plan Note (Signed)
Pressure ulcer/lesion to bride of nose from simple mask x 2. No evidence of infection. Rx bactroban to be applied to open lesion. Educated on proper cleaning/care. He will try to adjust his simple mask more often. He will obtain non-adherent dressing from the pharmacy. May need to refer to wound care if no improvement or if any worsening occurs.

## 2023-01-07 NOTE — Assessment & Plan Note (Addendum)
Admitted from 12/27/2022 to 01/04/2023 due to acute respiratory failure related to RSV pneumonia.  Slowly recovering but clinically improving.  Still requiring supplemental oxygen with exertion; able to maintain on room air at rest. Encouraged him to restart bronchodilator regimen. Complete steroids as directed. Target cough control measures to limit further inflammation/irritation. Strict return precautions.   Patient Instructions  Continue Albuterol inhaler 2 puffs or 3 mL neb every 6 hours as needed for shortness of breath or wheezing. Notify if symptoms persist despite rescue inhaler/neb use. Use your nebulizer at least twice a day until symptoms resolve Continue benzonatate 1 capsule Three times a day for cough Continue gabapentin 1 capsule Three times a day. May cause drowsiness. Continue hycodan cough syrup 5 mL every 8 hours as needed for cough. May cause drowsiness. Do not drive after taking Continue Xyzal 1 tab daily Continue montelukast (singulair) 1 tab At bedtime Continue pantoprazole 1 tab daily  Complete prednisone taper as previously prescribed Continue supplemental oxygen 5 lpm with activity. Ok to leave oxygen off for brief periods when you are at rest if your oxygen is above 88-90% Apply mupirocin ointment Twice daily to sore on the bridge of your nose. Cover with a nonstick dressing  Follow up in 4 weeks with Dr. Silas Flood. If symptoms do not improve or worsen, please contact office for sooner follow up or seek emergency care.

## 2023-01-07 NOTE — Assessment & Plan Note (Signed)
Unclear whether there has been progression of disease vs superimposed infectious/inflammatory process. Plan to repeat imaging in 6 weeks for further evaluation. May consider repeating PFTs. See above plan.

## 2023-01-07 NOTE — Assessment & Plan Note (Signed)
Currently off ICS/LABA inhaler. Did not notice previous response with Breo inhaler. May retrial upon his return. For now, he will continue nebulized therapies. Asthma action plan in place.

## 2023-01-18 ENCOUNTER — Encounter: Payer: Self-pay | Admitting: Nurse Practitioner

## 2023-01-18 DIAGNOSIS — Z09 Encounter for follow-up examination after completed treatment for conditions other than malignant neoplasm: Secondary | ICD-10-CM

## 2023-01-18 DIAGNOSIS — Z9981 Dependence on supplemental oxygen: Secondary | ICD-10-CM | POA: Insufficient documentation

## 2023-01-18 HISTORY — DX: Dependence on supplemental oxygen: Z99.81

## 2023-01-18 HISTORY — DX: Encounter for follow-up examination after completed treatment for conditions other than malignant neoplasm: Z09

## 2023-01-18 NOTE — Assessment & Plan Note (Signed)
Recent hospital discharge for RSV Pna. Currently on home O2 for continued lower O2 saturations while up and around. He is improving.  Recommend continue with pulmonology instructions. O2 at 5L [place]. Sat goals 88-90 ok on room air. If less than 88% recommend O2.  Will continue to monitor

## 2023-01-18 NOTE — Assessment & Plan Note (Signed)
Recent hospitalization for RSV Pna. Continues on supplemental O2 at 5L [place] when moving. Goal sats >88% on RA and >90% on O2. Patient appears to be improving and looks well today. We may need to consider PT once he has weaned off of his O2 to help with stamina. Will continue to monitor closely. Medications reviewed. No changes today.

## 2023-01-18 NOTE — Assessment & Plan Note (Signed)
Chronic. Stable. No alarm symptoms present at this time.

## 2023-01-18 NOTE — Assessment & Plan Note (Signed)
No alarm symptoms present at this time. No signs of fluid volume overload present today. He is on home O2, but is weaning off of this with the help of pulmonology. No concerns present at this time. Will monitor.

## 2023-01-18 NOTE — Assessment & Plan Note (Addendum)
Chronic. Likely contributing factor to recent severe symptoms associated with RSV infection. He is doing well at this time, however, still on supplemental O2. Hopeful that he can wean off of this in the near future. Pulmonology planning to repeat imaging in the near future to assess if progression of disease is present. Will continue to monitor.

## 2023-01-18 NOTE — Assessment & Plan Note (Signed)
Chronic. Review of recent labs show stable at this time. Will continue to monitor closely.

## 2023-01-18 NOTE — Assessment & Plan Note (Signed)
Chronic. No recent exacerbation with hospitalization. He is experiencing generalized weakness, which may increase sedation with medication. Monitor closely for sedation and respiratory depression with use.

## 2023-01-18 NOTE — Assessment & Plan Note (Signed)
5L [place] with goal >90% saturation when moving around. Currently weaning with pulmonology. Doing very well

## 2023-01-20 ENCOUNTER — Ambulatory Visit (INDEPENDENT_AMBULATORY_CARE_PROVIDER_SITE_OTHER): Payer: Medicare Other | Admitting: Nurse Practitioner

## 2023-01-20 ENCOUNTER — Encounter: Payer: Self-pay | Admitting: Nurse Practitioner

## 2023-01-20 VITALS — BP 98/62 | HR 68 | Ht 68.0 in | Wt 180.6 lb

## 2023-01-20 DIAGNOSIS — J849 Interstitial pulmonary disease, unspecified: Secondary | ICD-10-CM | POA: Diagnosis not present

## 2023-01-20 DIAGNOSIS — J9601 Acute respiratory failure with hypoxia: Secondary | ICD-10-CM | POA: Diagnosis not present

## 2023-01-20 DIAGNOSIS — J4530 Mild persistent asthma, uncomplicated: Secondary | ICD-10-CM | POA: Diagnosis not present

## 2023-01-20 DIAGNOSIS — L8990 Pressure ulcer of unspecified site, unspecified stage: Secondary | ICD-10-CM

## 2023-01-20 DIAGNOSIS — J121 Respiratory syncytial virus pneumonia: Secondary | ICD-10-CM | POA: Diagnosis not present

## 2023-01-20 NOTE — Patient Instructions (Addendum)
Continue Albuterol inhaler 2 puffs or 3 mL neb every 6 hours as needed for shortness of breath or wheezing. Notify if symptoms persist despite rescue inhaler/neb use. Use your nebulizer at least twice a day until symptoms resolve Continue benzonatate 1 capsule Three times a day for cough Continue gabapentin 1 capsule Three times a day. May cause drowsiness. Continue hycodan cough syrup 5 mL every 8 hours as needed for cough. May cause drowsiness. Do not drive after taking Continue Xyzal 1 tab daily Continue montelukast (singulair) 1 tab At bedtime Continue pantoprazole 1 tab daily   Continue supplemental oxygen 3 lpm with activity. Ok to leave oxygen off for brief periods when you are at rest if your oxygen is above 88-90%   Follow up in 4 weeks with Dr. Silas Flood. If symptoms do not improve or worsen, please contact office for sooner follow up or seek emergency care.

## 2023-01-20 NOTE — Assessment & Plan Note (Signed)
Previously tried on Breo without any perceived response. Could consider retrial in the future. Asthma action plan in place. See above.

## 2023-01-20 NOTE — Assessment & Plan Note (Signed)
Related to simple mask. Improving today. He will continue bactroban and cover lesion to protect from mask.

## 2023-01-20 NOTE — Progress Notes (Signed)
$'@Patient'i$  ID: Gregory [name], male    DOB: 1941-08-25, 82 y.o.   MRN: [id number]  Chief Complaint  Patient presents with   Follow-up    Pt f/u he states that he is feeling better. He states that his breathing is variable and is dependant on how much he is moving. Currently on 4L continuous or 5L pulsed.     Referring provider: [name], NP  HPI: 82 year old male, never smoker followed for chronic cough, pulmonary arteriovenous malformation, ILD. He is a patient of Dr. Kavin Leech and last seen in office 01/06/2023 by Belenda Cruise NP. Past medical history significant for Osler Weber Rendu disease, HTN, CHF, PAF, GERD, hypothyroid, hx of SIADH, BPH, IDA, HLD.   He was recently admitted from 12/27/2022 to 01/04/2023 due to acute respiratory failure related to RSV pneumonia.  He had significant oxygen requirements upon admission.  He was treated with steroids, diuretics, bronchodilators, and aggressive pulmonary toileting.  PCCM was consulted for further evaluation.  He had a CT of the chest which was significant for underlying interstitial lung disease but it was difficult to pronounce any progression.  He had clinical improvement and was discharged home on 5 L supplemental O2 via facemask as he is unable to tolerate nasal cannula due to previous nasal surgeries.  He also was instructed to complete prednisone taper.  TEST/EVENTS:  07/31/2021 PFT: FVC 72, FEV1 81, ratio 84, TLC 72, DLCOcor 43.  No BD 08/17/2021 CT chest with contrast: Central pulmonary vasculature with signs of large right lower lobe pulmonary AVM, stable when compared to previous.  Mildly patulous esophagus.  No LAD.  Mild groundglass and basilar atelectasis at the lung bases, similar to previous.  Mild subpleural reticulation and biapical scarring is stable.  Airways are patent.  Atherosclerosis. 12/29/2022 echo: EF 55 to 60%.  Mild LVH.  RV size and function is normal.  Unable to measure PASP.  No valvular abnormalities. 01/03/2023 CT  chest without contrast: Atherosclerosis.  Lungs well-inflated.  Subtle bilateral increased peripheral interstitial markings.  Stable biapical pleural thickening.  Stable right lower lobe AVM.  Patchy hazy airspace opacification over the bilateral lower lobes.  09/16/2021: OV with Dr. Silas Flood.  At previous visit, he was complaining of dyspnea and presyncope.  Concern for growth of AVM in the lung and increased right to left shunting leading to hypoxemia.  He had a CT chest with contrast showing stable AVM largest in the right lower lobe.  It also showed chronic posterior/dependent areas of groundglass without frank honeycombing or interlobular septal thickening.  Findings were stable compared to 2019.  Pulmonary function testing was obtained which showed normal spirometry, mild restriction with severely reduced DLCO.  CBC was checked and he was found to have a hemoglobin of 6.8.  He was started on iron supplementation and received blood transfusion.  Symptoms are improving now.  Chronic cough felt to be multifactorial.  Improved with Dulera in the past although more recently Breo did not help.  Wakes question of cough variant asthma.  Did not improve with high-dose Dulera or low-dose gabapentin.  Would consider increasing gabapentin in the future although he did have presyncope with higher dose in the past.  DOE likely multifactorial as well.  Worsening in summer 2022 related to anemia, hemoglobin found to be 6.8; likely due to chronic GI losses in the setting of his AVM.  01/07/2023: Ok Edwards with Brekyn Huntoon NP for hospital follow-up.  He tells me that he is feeling significantly better compared  to when he was admitted.  Feels like he is slowly regaining his strength back.  Does not feel like his breathing is quite back to normal.  He did try to wean himself off oxygen at home and desaturated to the mid 80s with exertion.  He was able to maintain greater than 90% on room air at rest.  Cough is minimally productive with  clear sputum.  Gets a little bit worse at night.  Denies any wheezing, fevers, chills, hemoptysis, lower extremity swelling, orthopnea.  He is on on prednisone taper.  Does have benzonatate and Hycodan cough syrup at home.  He has not started using his neb treatments again since he was discharged. He does have two small sores on the bridge of his nose from the simple mask.  One is open and the other is red but the skin is closed.  No purulent drainage, edema. Has started putting a Band-Aid on it, which helps to decrease the rubbing.  01/20/2023: Today - follow up Patient presents today for follow up with his wife. He was supposed to follow up in 4 weeks but somehow was scheduled for 2 week follow up. Fortunately, he is doing quite well. Feels better than when he was here last time. His breathing seems closer to his baseline. Fatigue is improving. His cough has mostly resolved. Occasionally will produce some clear sputum. He has tried to take off his oxygen at home when he's up doing things, such as showering, but he has dropped to the mid 80's. He is fine when he's sitting. Denies any fevers, chills, hemoptysis, leg swelling. The wound on his nose is better. Using a bandaid so the simple mask doesn't rub it. He completed steroid taper.   Allergies  Allergen Reactions   Nsaids Other (See Comments)    Cannot take-per MD   Other Other (See Comments)    All blood thinners-cannot take per MD   Aspirin Other (See Comments)    nosebleeds   Statins Other (See Comments)    Weakness, myalgias   Demeclocycline Other (See Comments)    Passed out   Dust Mite Extract Other (See Comments)   Tizanidine Hcl Other (See Comments)    Patient stated that after taking this medication he experienced feelings of dizziness and disorientation.     Immunization History  Administered Date(s) Administered   COVID-19, mRNA, vaccine(Comirnaty)12 years and older 10/06/2022   Fluad Quad(high Dose 65+) 08/24/2019, 10/02/2020,  09/11/2021   Influenza Split 09/21/2011, 09/27/2012   Influenza Whole 09/11/2008, 09/11/2009, 09/22/2010   Influenza, High Dose Seasonal PF 09/09/2016, 09/16/2017, 10/03/2018   Influenza, Quadrivalent, Recombinant, Inj, Pf 08/20/2022   Influenza,inj,Quad PF,6+ Mos 10/04/2013, 10/08/2014, 10/02/2015   PFIZER Comirnaty(Gray Top)Covid-19 Tri-Sucrose Vaccine 06/08/2021   PFIZER(Purple Top)SARS-COV-2 Vaccination 01/03/2020, 01/23/2020, 10/18/2020   PNEUMOCOCCAL CONJUGATE-20 04/26/2022   Pfizer Covid-19 Vaccine Bivalent Booster 23yr & up 10/21/2021   Pneumococcal Conjugate-13 12/07/2016   Pneumococcal Polysaccharide-23 03/12/2010, 06/27/2018   Tdap 10/04/2013   Varicella 12/20/2009   Zoster Recombinat (Shingrix) 06/24/2022, 10/25/2022   Zoster, Live 09/06/2022    Past Medical History:  Diagnosis Date   Acute respiratory failure with hypoxia ([facility] 01/07/2023   Anxiety state, unspecified    Arthritis    Chronic cough 10/09/2019   Chronic hyponatremia 10/24/2019   GERD (gastroesophageal reflux disease)    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  WITHOUT HEMOPTYSIS   History of nonmelanoma skin cancer EXCISION SQUAMOUS CELL FROM HAND   Hypertrophy of prostate with  urinary obstruction and other lower urinary tract symptoms (LUTS)    ILD (interstitial lung disease) (HCC)    Irritable bowel syndrome    Nocturnal leg cramps 06/11/2013   Plantar fascial fibromatosis    Pure hypercholesterolemia    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     albuterol (PROVENTIL) (2.5 MG/3ML) 0.083% nebulizer solution Take 3 mLs (2.5 mg total) by nebulization every 6 (six) hours as needed for wheezing. 75 mL 0   albuterol (VENTOLIN HFA) 108 (90 Base) MCG/ACT inhaler Inhale 2 puffs into the lungs every 6 (six) hours as needed for wheezing or shortness of breath. 8 g 0   Ascorbic Acid (VITAMIN C) 1000 MG tablet Take 500 mg by mouth daily.     Cholecalciferol 125 MCG (5000 UT) capsule Take 5,000 Units by mouth daily.     clidinium-chlordiazePOXIDE (LIBRAX) 5-2.5 MG capsule Take 1 capsule by mouth 3 (three) times daily before meals. 90 capsule 3   clorazepate (TRANXENE-T) 7.5 MG tablet Take 1 tablet (7.5 mg total) by mouth at bedtime as needed for anxiety. 30 tablet 3   docusate sodium (COLACE) 100 MG capsule Take 200 mg by mouth at bedtime.     fluorometholone (FML) 0.1 % ophthalmic suspension Place 1 drop into both eyes 2 (two) times daily.     fluorouracil (EFUDEX) 5 % cream daily     gabapentin (NEURONTIN) 100 MG capsule Take 1 capsule (100 mg total) by mouth 2 (two) times daily. 180 each 3   Iron, Ferrous Sulfate, 325 (65 Fe) MG TABS Take 325 mg by mouth in the morning, at noon, and at bedtime. 30 tablet 2   levocetirizine (XYZAL) 5 MG tablet Take 1 tablet (5 mg total) by mouth every evening. 90 tablet 1   levothyroxine (SYNTHROID) 150 MCG tablet Take 1 tablet (150 mcg total) by mouth daily before breakfast. 90 tablet 3   magic mouthwash (nystatin, hydrocortisone, diphenhydrAMINE) suspension Swish and swallow 5 mLs 3 (three) times daily as needed (mouth and throat irritation). (Patient taking differently: Swish and swallow 5 mLs 3 (three) times daily as needed for mouth pain.) 480 mL 3   Magnesium 250 MG TABS Take 250 mg by mouth at bedtime.      meclizine (ANTIVERT) 25 MG tablet Take 1 tablet (25 mg  total) by mouth 2 (two) times daily as needed for dizziness. 30 tablet 6   methocarbamol (ROBAXIN) 500 MG tablet Take 1 tablet (500 mg total) by mouth every 8 (eight) hours as needed for muscle spasms. 270 tablet 0   montelukast (SINGULAIR) 10 MG tablet TAKE 1 TABLET BY MOUTH AT  BEDTIME (Patient taking differently: Take 10 mg by mouth at bedtime.) 90 tablet 3   Multiple Vitamin (MULTIVITAMIN WITH MINERALS) TABS tablet Take 1 tablet by mouth every morning.     mupirocin ointment (BACTROBAN) 2 % Apply 1 Application  topically 2 (two) times daily. To affected area 22 g 0   pantoprazole (PROTONIX) 40 MG tablet TAKE 1 TABLET BY MOUTH TWICE  DAILY (Patient taking differently: Take 40 mg by mouth daily.) 180 tablet 3   silodosin (RAPAFLO) 8 MG CAPS capsule Take 1 capsule (8 mg total) by mouth at bedtime. TAKE ONE CAPSULE BY MOUTH DAILY AT BEDTIME 90 capsule 1   valsartan (DIOVAN) 40 MG tablet TAKE 1 TABLET BY MOUTH EVERY DAY (Patient taking differently: Take 40 mg by mouth daily.) 90 tablet 1   benzonatate (TESSALON) 200 MG capsule Take 1 capsule (200 mg total) by mouth 3 (three) times daily as needed for cough. (Patient not taking: Reported on 01/20/2023) 45 capsule 0   HYDROcodone bit-homatropine (HYCODAN) 5-1.5 MG/5ML syrup Take 5 mLs by mouth every 8 (eight) hours as needed for cough. (Patient not taking: Reported on 01/20/2023) 120 mL 0   predniSONE (DELTASONE) 10 MG tablet Take 4 tablets (40 mg) by mouth daily for 3 days, then 3 tablets (30 mg) daily for 3 days, then 2 tablets (20 mg) daily for 3 days, then 1 tablet (10 mg) daily for 3 days. Start on 01/17 (Wednesday) per MD 30 tablet 0   No facility-administered medications prior to visit.     Review of Systems:   Constitutional: No weight loss or gain, night sweats, fevers, chills. +fatigue, lassitude (improving) HEENT: No headaches, difficulty swallowing, tooth/dental problems, or sore throat. No sneezing, itching, ear ache, nasal congestion, or  post nasal drip CV:  No chest pain, orthopnea, PND, swelling in lower extremities, anasarca, dizziness, palpitations, syncope Resp: +shortness of breath with exertion (improving); resolving productive cough. No hemoptysis. No wheezing.  No chest wall deformity GI:  No heartburn, indigestion, abdominal pain, nausea, vomiting, diarrhea, loss of appetite GU: No dysuria, change in color of urine, urgency or frequency.  No flank pain, no hematuria  Skin: +lesion x 1 on bridge of nose (improving); no exudate. No rash  MSK:  No joint pain or swelling.   Neuro: No dizziness or lightheadedness.  Psych: No depression or anxiety. Mood stable.     Physical Exam:  BP 98/62   Pulse 68   Ht '5\' 8"'$  (1.727 m)   Wt 180 lb 9.6 oz (81.9 kg)   SpO2 94% Comment: 5L pulsed  BMI 27.46 kg/m   GEN: Pleasant, interactive, well-kempt; chronically-ill appearing; in no acute distress. HEENT:  Normocephalic and atraumatic. PERRLA. Sclera white. Nasal turbinates pink, moist and patent bilaterally. No rhinorrhea present. Oropharynx pink and moist, without exudate or edema. No lesions, ulcerations, or postnasal drip.  NECK:  Supple w/ fair ROM. No JVD present. Normal carotid impulses w/o bruits. Thyroid symmetrical with no goiter or nodules palpated. No lymphadenopathy.   CV: RRR, no m/r/g, no peripheral edema. Pulses intact, +2 bilaterally. No cyanosis, pallor or clubbing. PULMONARY:  Unlabored, regular breathing. Clear bilaterally A&P w/o wheezes/rales/rhonchi. No accessory muscle use.  GI: BS present and normoactive. Soft, non-tender to palpation. No organomegaly or masses detected.  MSK: No erythema, warmth or tenderness. Cap refil <2 sec all extrem. No deformities or joint swelling noted.  Neuro: A/Ox3. No focal deficits noted.   Skin: 2 mm open lesion to bridge of nose, erythematous, non-blanchable; no exudate, warmth.  Psych: Normal affect and behavior. Judgement and thought content appropriate.     Lab  Results:  CBC    Component Value Date/Time   WBC 15.7 (H) 12/30/2022 0548   RBC 4.25 12/30/2022 0548  HGB 13.1 12/30/2022 0548   HGB 14.8 12/24/2022 0953   HCT 38.2 (L) 12/30/2022 0548   PLT 310 12/30/2022 0548   PLT 245 12/24/2022 0953   MCV 89.9 12/30/2022 0548   MCH 30.8 12/30/2022 0548   MCHC 34.3 12/30/2022 0548   RDW 12.8 12/30/2022 0548   LYMPHSABS 3.3 12/30/2022 0548   MONOABS 1.8 (H) 12/30/2022 0548   EOSABS 0.0 12/30/2022 0548   BASOSABS 0.0 12/30/2022 0548    BMET    Component Value Date/Time   NA 128 (L) 01/04/2023 0605   NA 133 (L) 10/28/2022 1400   K 4.7 01/04/2023 0605   CL 91 (L) 01/04/2023 0605   CO2 29 01/04/2023 0605   GLUCOSE 86 01/04/2023 0605   BUN 17 01/04/2023 0605   BUN 13 10/28/2022 1400   CREATININE 0.85 01/04/2023 0605   CALCIUM 8.8 (L) 01/04/2023 0605   GFRNONAA >60 01/04/2023 0605   GFRAA >60 06/01/2020 1106    BNP    Component Value Date/Time   BNP 37.1 12/30/2022 0548     Imaging:  CT CHEST WO CONTRAST  Result Date: 01/03/2023 CLINICAL DATA:  Follow-up interstitial lung disease. Hypoxia. History of Osler Weber Rendu syndrome. EXAM: CT CHEST WITHOUT CONTRAST TECHNIQUE: Multidetector CT imaging of the chest was performed following the standard protocol without IV contrast. RADIATION DOSE REDUCTION: This exam was performed according to the departmental dose-optimization program which includes automated exposure control, adjustment of the mA and/or kV according to patient size and/or use of iterative reconstruction technique. COMPARISON:  08/17/2021 and 12/07/2020 FINDINGS: Cardiovascular: Heart is normal size. Mild calcified plaque in the region of the left main coronary artery. Thoracic aorta is normal in caliber. Minimal calcified plaque over the descending thoracic aorta. Main and proximal pulmonary arteries are normal. Mediastinum/Nodes: No mediastinal or hilar adenopathy. Remaining mediastinal structures are unremarkable.  Lungs/Pleura: Lungs are well inflated. Subtle stable bilateral increased peripheral interstitial markings. Stable biapical pleural thickening with minimal associated pleural calcification. 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 Patchy hazy airspace opacification over the bilateral lower lobes which may be due to infectious or inflammatory process. No effusion. Airways are unremarkable Upper Abdomen: Mild calcified plaque over the abdominal aorta. No acute findings. Musculoskeletal: No focal abnormality. IMPRESSION: 1. Patchy hazy airspace opacification over the bilateral lower lobes which may be due to an infectious or inflammatory process. 2. 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 3. Aortic atherosclerosis. Mild atherosclerotic coronary artery disease. Aortic Atherosclerosis (ICD10-I70.0). Electronically Signed   By: Marin Olp M.D.   On: 01/03/2023 10:10   ECHOCARDIOGRAM COMPLETE  Result Date: 12/29/2022    ECHOCARDIOGRAM REPORT   Patient Name:   AMARE BAIL Date of Exam: 12/29/2022 Medical Rec #:  [id number]        Height:       70.0 in Accession #:    [id number]       Weight:       179.0 lb Date of Birth:  1941-02-13       BSA:          1.991 m Patient Age:    72 years         BP:           138/70 mmHg Patient Gender: M                HR:           94 bpm. Exam Location:  Inpatient Procedure: 2D Echo, Cardiac Doppler, Color  Doppler and 3D Echo Indications:    Pulmonary hypertension I27.2  History:        Patient has prior history of Echocardiogram examinations, most                 recent 06/04/2021. Risk Factors:Dyslipidemia. Interstitial lung                 diesase.  Sonographer:    Darlina Sicilian RDCS Referring Phys: [id number] [place]  1. Left ventricular ejection fraction, by estimation, is 55 to 60%. The left ventricle has normal function. The left ventricle has no regional wall motion abnormalities. There is mild left ventricular hypertrophy. Indeterminate  diastolic filling due to E-A fusion.  2. Right ventricular systolic function is normal. The right ventricular size is normal. Tricuspid regurgitation signal is inadequate for assessing PA pressure.  3. The mitral valve is grossly normal. No evidence of mitral valve regurgitation. No evidence of mitral stenosis.  4. The aortic valve is tricuspid. Aortic valve regurgitation is not visualized. No aortic stenosis is present.  5. The inferior vena cava is normal in size with greater than 50% respiratory variability, suggesting right atrial pressure of 3 mmHg. FINDINGS  Left Ventricle: Left ventricular ejection fraction, by estimation, is 55 to 60%. The left ventricle has normal function. The left ventricle has no regional wall motion abnormalities. The left ventricular internal cavity size was normal in size. There is  mild left ventricular hypertrophy. Indeterminate diastolic filling due to E-A fusion. Right Ventricle: The right ventricular size is normal. No increase in right ventricular wall thickness. Right ventricular systolic function is normal. Tricuspid regurgitation signal is inadequate for assessing PA pressure. Left Atrium: Left atrial size was normal in size. Right Atrium: Right atrial size was normal in size. Pericardium: There is no evidence of pericardial effusion. Mitral Valve: The mitral valve is grossly normal. No evidence of mitral valve regurgitation. No evidence of mitral valve stenosis. Tricuspid Valve: The tricuspid valve is grossly normal. Tricuspid valve regurgitation is not demonstrated. Aortic Valve: The aortic valve is tricuspid. Aortic valve regurgitation is not visualized. No aortic stenosis is present. Pulmonic Valve: The pulmonic valve was grossly normal. Pulmonic valve regurgitation is trivial. No evidence of pulmonic stenosis. Aorta: The aortic root is normal in size and structure. Venous: The inferior vena cava is normal in size with greater than 50% respiratory variability, suggesting  right atrial pressure of 3 mmHg. IAS/Shunts: The atrial septum is grossly normal.  LEFT VENTRICLE PLAX 2D LVIDd:         4.85 cm   Diastology LVIDs:         2.90 cm   LV e' medial:    5.87 cm/s LV PW:         0.90 cm   LV E/e' medial:  11.2 LV IVS:        1.10 cm   LV e' lateral:   8.16 cm/s LVOT diam:     1.90 cm   LV E/e' lateral: 8.1 LV SV:         64 LV SV Index:   32 LVOT Area:     2.84 cm  RIGHT VENTRICLE RV S prime:     21.10 cm/s TAPSE (M-mode): 1.2 cm LEFT ATRIUM             Index        RIGHT ATRIUM           Index LA diam:  3.40 cm 1.71 cm/m   RA Area:     10.80 cm LA Vol (A2C):   36.4 ml 18.28 ml/m  RA Volume:   19.20 ml  9.64 ml/m LA Vol (A4C):   36.5 ml 18.33 ml/m LA Biplane Vol: 37.4 ml 18.78 ml/m  AORTIC VALVE LVOT Vmax:   113.00 cm/s LVOT Vmean:  73.900 cm/s LVOT VTI:    0.227 m  AORTA Ao Root diam: 3.40 cm MITRAL VALVE MV Area (PHT): 4.06 cm    SHUNTS MV Decel Time: 187 msec    Systemic VTI:  0.23 m MV E velocity: 66.00 cm/s  Systemic Diam: 1.90 cm MV A velocity: 99.90 cm/s MV E/A ratio:  0.66 Cherlynn Kaiser MD Electronically signed by Cherlynn Kaiser MD Signature Date/Time: 12/29/2022/5:12:13 PM    Final    DG Chest Port 1 View  Result Date: 12/28/2022 CLINICAL DATA:  82 year old male with left rib pain after fall. EXAM: PORTABLE CHEST 1 VIEW COMPARISON:  Portable chest 12/27/2022 and earlier. FINDINGS: Portable AP semi upright view at 0652 hours. Mildly lower lung volumes. Mediastinal contours are stable and within normal limits. Visualized tracheal air column is within normal limits. Crowding of lung markings, underlying coarse chronic pulmonary interstitium. No pneumothorax, pleural effusion or consolidation. Osteopenic ribs. No acute osseous abnormality identified. Paucity of bowel gas in the upper abdomen. IMPRESSION: Lower lung volumes and chronic pulmonary interstitial changes. No acute cardiopulmonary abnormality or acute traumatic injury identified. Electronically Signed    By: Genevie Ann M.D.   On: 12/28/2022 07:30   DG Chest Port 1 View  Result Date: 12/27/2022 CLINICAL DATA:  Shortness of breath.  RSV EXAM: PORTABLE CHEST 1 VIEW COMPARISON:  Yesterday FINDINGS: Normal heart size and stable mediastinal contours. Stable interstitial coarsening. 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 No effusion or air bronchogram. IMPRESSION: Stable from prior.  No focal pneumonia. Electronically Signed   By: Jorje Guild M.D.   On: 12/27/2022 04:07   DG Chest 2 View  Result Date: 12/26/2022 CLINICAL DATA:  Shortness of breath EXAM: CHEST - 2 VIEW COMPARISON:  Chest x-ray 12/07/2020.  CT chest 08/17/2021. 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 There is no new lung infiltrate, pleural effusion or pneumothorax. The cardiomediastinal silhouette is within normal limits. No acute fractures are identified. IMPRESSION: No active cardiopulmonary disease. Electronically Signed   By: Ronney Asters M.D.   On: 12/26/2022 18:24         Latest Ref Rng & Units 07/31/2021   10:49 AM  PFT Results  FVC-Pre L 2.90   FVC-Predicted Pre % 72   FVC-Post L 2.81   FVC-Predicted Post % 69   Pre FEV1/FVC % % 81   Post FEV1/FCV % % 84   FEV1-Pre L 2.34   FEV1-Predicted Pre % 81   FEV1-Post L 2.38   DLCO uncorrected ml/min/mmHg 10.47   DLCO UNC% % 43   DLCO corrected ml/min/mmHg 10.47   DLCO COR %Predicted % 43   DLVA Predicted % 57   TLC L 5.07   TLC % Predicted % 72   RV % Predicted % 85     No results found for: "NITRICOXIDE"      Assessment & Plan:   RSV (respiratory syncytial virus pneumonia) Admitted from 12/27/2022 to 01/04/2023 due to acute respiratory failure related to RSV pneumonia and discharged on supplemental O2.  Clinically improving.  Still requiring supplemental oxygen with exertion but we  have been able to wean him to 3 lpm; able to maintain on room air at rest. Encouraged him to continue bronchodilator  regimen.   Patient Instructions  Continue Albuterol inhaler 2 puffs or 3 mL neb every 6 hours as needed for shortness of breath or wheezing. Notify if symptoms persist despite rescue inhaler/neb use. Use your nebulizer at least twice a day until symptoms resolve Continue benzonatate 1 capsule Three times a day for cough Continue gabapentin 1 capsule Three times a day. May cause drowsiness. Continue hycodan cough syrup 5 mL every 8 hours as needed for cough. May cause drowsiness. Do not drive after taking Continue Xyzal 1 tab daily Continue montelukast (singulair) 1 tab At bedtime Continue pantoprazole 1 tab daily   Continue supplemental oxygen 3 lpm with activity. Ok to leave oxygen off for brief periods when you are at rest if your oxygen is above 88-90%   Follow up in 4 weeks with Dr. Silas Flood. If symptoms do not improve or worsen, please contact office for sooner follow up or seek emergency care.    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 Plan to repeat imaging in 4 weeks for 6 week follow up. May consider repeating PFTs based on this. See above plan.   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 Could consider retrial in the future. Asthma action plan in place. See above.   Acute respiratory failure (HCC) Slow to resolve. Unclear if we will be able to wean him off entirely due to his underlying lung condition. He was able to maintain sats >90% at rest today. Still requiring 3 lpm via simple mask with exertion; improved from 5 lpm. Plan to continue monitoring and walking again at follow up. Goal >88-90%.   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 Improving today. He will continue bactroban and cover lesion to protect from mask.    I spent 30 minutes of dedicated to the care of this patient on the date of  this encounter to include pre-visit review of records, face-to-face time with the patient discussing conditions above, post visit ordering of testing, clinical documentation with the electronic health record, making appropriate referrals as documented, and communicating necessary findings to members of the patients care team.  Clayton Bibles, NP 01/20/2023  Pt aware and understands NP's role.

## 2023-01-20 NOTE — Assessment & Plan Note (Signed)
Slow to resolve. Unclear if we will be able to wean him off entirely due to his underlying lung condition. He was able to maintain sats >90% at rest today. Still requiring 3 lpm via simple mask with exertion; improved from 5 lpm. Plan to continue monitoring and walking again at follow up. Goal >88-90%.

## 2023-01-20 NOTE — Assessment & Plan Note (Signed)
Unclear whether there has been progression of disease vs superimposed infectious/inflammatory process. Plan to repeat imaging in 4 weeks for 6 week follow up. May consider repeating PFTs based on this. See above plan.

## 2023-01-20 NOTE — Assessment & Plan Note (Signed)
Admitted from 12/27/2022 to 01/04/2023 due to acute respiratory failure related to RSV pneumonia and discharged on supplemental O2.  Clinically improving.  Still requiring supplemental oxygen with exertion but we have been able to wean him to 3 lpm; able to maintain on room air at rest. Encouraged him to continue bronchodilator regimen.   Patient Instructions  Continue Albuterol inhaler 2 puffs or 3 mL neb every 6 hours as needed for shortness of breath or wheezing. Notify if symptoms persist despite rescue inhaler/neb use. Use your nebulizer at least twice a day until symptoms resolve Continue benzonatate 1 capsule Three times a day for cough Continue gabapentin 1 capsule Three times a day. May cause drowsiness. Continue hycodan cough syrup 5 mL every 8 hours as needed for cough. May cause drowsiness. Do not drive after taking Continue Xyzal 1 tab daily Continue montelukast (singulair) 1 tab At bedtime Continue pantoprazole 1 tab daily   Continue supplemental oxygen 3 lpm with activity. Ok to leave oxygen off for brief periods when you are at rest if your oxygen is above 88-90%   Follow up in 4 weeks with Dr. Silas Flood. If symptoms do not improve or worsen, please contact office for sooner follow up or seek emergency care.

## 2023-01-24 ENCOUNTER — Encounter: Payer: Self-pay | Admitting: Nurse Practitioner

## 2023-01-24 ENCOUNTER — Other Ambulatory Visit: Payer: Self-pay | Admitting: Nurse Practitioner

## 2023-01-24 ENCOUNTER — Ambulatory Visit (INDEPENDENT_AMBULATORY_CARE_PROVIDER_SITE_OTHER): Payer: Medicare Other | Admitting: Nurse Practitioner

## 2023-01-24 VITALS — BP 112/62 | HR 64 | Wt 181.2 lb

## 2023-01-24 DIAGNOSIS — E098 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: Secondary | ICD-10-CM

## 2023-01-24 DIAGNOSIS — I5032 Chronic diastolic (congestive) heart failure: Secondary | ICD-10-CM | POA: Diagnosis not present

## 2023-01-24 DIAGNOSIS — E222 Syndrome of inappropriate secretion of antidiuretic hormone: Secondary | ICD-10-CM | POA: Diagnosis not present

## 2023-01-24 DIAGNOSIS — E039 Hypothyroidism, unspecified: Secondary | ICD-10-CM

## 2023-01-24 DIAGNOSIS — N138 Other obstructive and reflux uropathy: Secondary | ICD-10-CM

## 2023-01-24 DIAGNOSIS — N401 Enlarged prostate with lower urinary tract symptoms: Secondary | ICD-10-CM

## 2023-01-24 DIAGNOSIS — I780 Hereditary hemorrhagic telangiectasia: Secondary | ICD-10-CM

## 2023-01-24 DIAGNOSIS — I10 Essential (primary) hypertension: Secondary | ICD-10-CM

## 2023-01-24 DIAGNOSIS — Z7185 Encounter for immunization safety counseling: Secondary | ICD-10-CM

## 2023-01-24 DIAGNOSIS — R030 Elevated blood-pressure reading, without diagnosis of hypertension: Secondary | ICD-10-CM

## 2023-01-24 DIAGNOSIS — I700 Atherosclerosis of aorta: Secondary | ICD-10-CM

## 2023-01-24 DIAGNOSIS — J849 Interstitial pulmonary disease, unspecified: Secondary | ICD-10-CM

## 2023-01-24 HISTORY — DX: Encounter for immunization safety counseling: Z71.85

## 2023-01-24 MED ORDER — SODIUM CHLORIDE 1 G PO TABS: 1.0000 g | ORAL_TABLET | Freq: Two times a day (BID) | ORAL | 3 refills | Status: AC

## 2023-01-24 MED ORDER — LEVOTHYROXINE SODIUM 150 MCG PO TABS: 150.0000 ug | ORAL_TABLET | Freq: Every day | ORAL | 3 refills | Status: DC

## 2023-01-24 MED ORDER — SILODOSIN 8 MG PO CAPS: 8.0000 mg | ORAL_CAPSULE | Freq: Every day | ORAL | 1 refills | Status: DC

## 2023-01-24 MED ORDER — VALSARTAN 40 MG PO TABS: 40.0000 mg | ORAL_TABLET | Freq: Every day | ORAL | 1 refills | Status: DC

## 2023-01-24 NOTE — Assessment & Plan Note (Signed)
Chronic hyponatremia.  We discussed the option of salt tablets in the outpatient setting as he did have these inpatient and saw a significant improvement in his sodium levels.  Will send in sodium chloride tablets for him to take daily to help improve his sodium levels.  No symptoms at this time.

## 2023-01-24 NOTE — Progress Notes (Signed)
Gregory Keeler, DNP, AGNP-c [place]  [address] [place], [place] [address] [phone]  ESTABLISHED PATIENT- Chronic Health and/or Follow-Up Visit  Blood pressure 112/62, pulse 64, weight 181 lb 3.2 oz (82.2 kg), SpO2 97 %.   3 month f/u doing better now, has some pain in hips and legs ongoing issue for two years, needs refill on valsartan, rapaflow and [facility]othyroxine [facility] and the other two go to [facility] is a 82 y.o. year old male presenting today for evaluation and management of the following: Interstitial Lung Disease Saw pulonologist last Thursday. Staying on O2 for at least 4 more weeks to allow lungs to heal. Currently at 5L facemask at home, does bump up to 6L when out and about. He has been moving and staying as active as possible to keep things moving through the lungs. He is feeling good and doing well. He does have a small sore on the bridge of his nose from the O2 mask, he has medication and is keeping a bandage on this and it is healing. He tells me his chronic cough is gone now that he is using the nebulizer and on the oxygen. He went a couple of days without the nebulizer treatment and the cough came back.  Rectus Abdominus He has concerns with a bulge in the abdomen at midline when he is using his abdominal muscles. No pain with this. He first noticed just in the last week or so. The bulging returns to normal once he releases the muscles.  RSV Vaccine He would like to know when he can get is RSV vaccine. He would like to get this as soon as possible to help prevent any additional illnesses.    Arthritis  He has had hip bursitis in the past and sees Dr. Netta Cedars. He has given him injections and this has been very beneficial to help control the pain. He plans to contact him today to see if he can get in for an injection. 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 When  discharging from the hospital, he was told not to take more than '325mg'$  of tylenol a day. He is con  SIADH His sodium levels are chronically low.  He was given salt tablets in the hospital which did help to normalize this sodium levels he would like to know if he should continue to take these outside of the hospital.  He is not having any symptoms at this time of low sodium.  He is working on salting his foods to help increase his numbers.  All ROS negative with exception of what is listed above.   PHYSICAL EXAM Physical Exam Vitals and nursing note reviewed.  Constitutional:      General: He is not in acute distress.    Appearance: Normal appearance. He is not ill-appearing.  HENT:     Head: Normocephalic and atraumatic.  Eyes:     Extraocular Movements: Extraocular movements intact.     Pupils: Pupils are equal, round, and reactive to light.  Neck:     Vascular: No carotid bruit.  Cardiovascular:     Rate and Rhythm: Normal rate and regular rhythm.     Pulses: Normal pulses.     Heart sounds: Normal heart sounds.  Pulmonary:     Effort: Pulmonary effort is normal.     Breath sounds: Normal breath sounds. No wheezing or rhonchi.  Comments: Face mask O2 device in place.  Abdominal:     General: Bowel sounds are normal. There is no distension.     Palpations: Abdomen is soft.     Tenderness: There is no abdominal tenderness. There is no right CVA tenderness, left CVA tenderness or guarding.  Musculoskeletal:        General: Normal range of motion.     Cervical back: Neck supple. No tenderness.     Right lower leg: No edema.     Left lower leg: No edema.  Lymphadenopathy:     Cervical: No cervical adenopathy.  Skin:    General: Skin is warm and dry.     Capillary Refill: Capillary refill takes less than 2 seconds.  Neurological:     General: No focal deficit present.     Mental Status: He is alert and oriented to person, place, and time.     Motor: No weakness.     Gait:  Gait normal.  Psychiatric:        Mood and Affect: Mood normal.        Behavior: Behavior normal.        Thought Content: Thought content normal.     PLAN Problem List Items Addressed This Visit     Essential hypertension (Chronic)    Chronic hypertension currently well-controlled at this time.  He is not having any alarm symptoms.  He is taking his medication as directed without missed doses.  Refills provided today.  Labs reviewed from hospitalization.  At this time I do not feel it is necessary to obtain further labs.  We will plan on repeat monitoring in approximately 6 months or sooner if needed.      Relevant Medications   valsartan (DIOVAN) 40 MG tablet   Hypothyroidism    Chronic.  Refills provided today.  Last labs were done less than 3 months ago and were stable therefore I do not feel that repeat labs are necessary at this time.  We will plan to follow-up in approximately 6 months and repeat laboratory evaluation unless there are concerning signs or symptoms that present sooner.      Relevant Medications   levothyroxine (SYNTHROID) 150 MCG tablet   Osler-Weber-Rendu disease (HCC)    Chronic hereditary condition.  At this time he has no signs of bleeding.  Will continue to monitor labs closely.  Recommend that he avoid NSAIDs.  We did discuss that it would be safe for him to use Tylenol for his arthritic pain.  Will continue to monitor closely.      Relevant Medications   valsartan (DIOVAN) 40 MG tablet   Benign prostatic hyperplasia with urinary obstruction    Refill provided on Rapaflo today.  Patient is tolerating medication well without any side effects and this is helpful for reduction in urinary retention and hesitancy.  Plan to continue.      Relevant Medications   silodosin (RAPAFLO) 8 MG CAPS capsule   SIADH (syndrome of inappropriate ADH production) (HCC)    Chronic hyponatremia.  We discussed the option of salt tablets in the outpatient setting as he did have  these inpatient and saw a significant improvement in his sodium levels.  Will send in sodium chloride tablets for him to take daily to help improve his sodium levels.  No symptoms at this time.      Atherosclerosis of aorta (HCC)    Chronic. Managed with statin therapy, diet, and activity. At this time he is not  having any alarm symptoms. Recommend continuation of current therapy. We will plan to assess labs again in 6 months.       Relevant Medications   valsartan (DIOVAN) 40 MG tablet   Chronic diastolic heart failure (HCC) - Primary    Chronic. He really appears to be doing quite well with this at this time. His lungs are clear today and he has no edema present.  At this time there are no alarm symptoms that warrant laboratory evaluation.  Given that he did have extensive laboratory monitoring recently we will hold off and plan to obtain labs in approximately 6 months at his next follow-up visit.  He will reach out if he has any concerns or new symptoms between now and then.      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  At this time is unclear if this has caused further progression of disease.  He is currently using oxygen therapy to maintain saturation greater than 88%.  Review of pulmonology note shows plan for repeat PFTs at next follow-up.  His lungs are clear today with no sign of respiratory distress on supplemental oxygen.  Will continue to monitor closely.  He has had improved breathing with use of nebulizers from his typical baseline.  I have encouraged him to continue these.      Vaccine counseling    Discussion with patient today about receiving RSV vaccine following her recent RSV illness.  Given that he  does likely have medical immunity it is reasonable to wait approximately 3 months following the infection before obtaining the vaccine.  Discussed this with patient.  He plans to go to the pharmacy to have this done in March or April.      Other Visit Diagnoses     Elevated BP without diagnosis of hypertension       Benign prostatic hyperplasia with lower urinary tract symptoms, symptom details unspecified       Relevant Medications   silodosin (RAPAFLO) 8 MG CAPS capsule       Return in about 6 months (around 07/25/2023) for Med Mgmt.   Gregory Keeler, DNP, AGNP-c 01/24/2023  9:57 AM

## 2023-01-24 NOTE — Assessment & Plan Note (Signed)
Discussion with patient today about receiving RSV vaccine following her recent RSV illness.  Given that he does likely have medical immunity it is reasonable to wait approximately 3 months following the infection before obtaining the vaccine.  Discussed this with patient.  He plans to go to the pharmacy to have this done in March or April.

## 2023-01-24 NOTE — Assessment & Plan Note (Signed)
Chronic hypertension currently well-controlled at this time.  He is not having any alarm symptoms.  He is taking his medication as directed without missed doses.  Refills provided today.  Labs reviewed from hospitalization.  At this time I do not feel it is necessary to obtain further labs.  We will plan on repeat monitoring in approximately 6 months or sooner if needed.

## 2023-01-24 NOTE — Assessment & Plan Note (Signed)
Refill provided on Rapaflo today.  Patient is tolerating medication well without any side effects and this is helpful for reduction in urinary retention and hesitancy.  Plan to continue.

## 2023-01-24 NOTE — Patient Instructions (Addendum)
Continue your oxygen and nebulizer treatment as directed.   Let me know if you have any issues.

## 2023-01-24 NOTE — Assessment & Plan Note (Signed)
Chronic hereditary condition.  At this time he has no signs of bleeding.  Will continue to monitor labs closely.  Recommend that he avoid NSAIDs.  We did discuss that it would be safe for him to use Tylenol for his arthritic pain.  Will continue to monitor closely.

## 2023-01-24 NOTE — Assessment & Plan Note (Signed)
Chronic.  Refills provided today.  Last labs were done less than 3 months ago and were stable therefore I do not feel that repeat labs are necessary at this time.  We will plan to follow-up in approximately 6 months and repeat laboratory evaluation unless there are concerning signs or symptoms that present sooner.

## 2023-01-24 NOTE — Assessment & Plan Note (Addendum)
Chronic. He really appears to be doing quite well with this at this time. His lungs are clear today and he has no edema present.  At this time there are no alarm symptoms that warrant laboratory evaluation.  Given that he did have extensive laboratory monitoring recently we will hold off and plan to obtain labs in approximately 6 months at his next follow-up visit.  He will reach out if he has any concerns or new symptoms between now and then.

## 2023-01-24 NOTE — Assessment & Plan Note (Signed)
Chronic. Managed with statin therapy, diet, and activity. At this time he is not having any alarm symptoms. Recommend continuation of current therapy. We will plan to assess labs again in 6 months.

## 2023-01-24 NOTE — Assessment & Plan Note (Signed)
Chronic interstitial lung disease recently exacerbated with RSV infection.  At this time is unclear if this has caused further progression of disease.  He is currently using oxygen therapy to maintain saturation greater than 88%.  Review of pulmonology note shows plan for repeat PFTs at next follow-up.  His lungs are clear today with no sign of respiratory distress on supplemental oxygen.  Will continue to monitor closely.  He has had improved breathing with use of nebulizers from his typical baseline.  I have encouraged him to continue these.

## 2023-02-03 ENCOUNTER — Ambulatory Visit: Payer: Medicare Other | Admitting: Pulmonary Disease

## 2023-02-08 ENCOUNTER — Telehealth: Payer: Self-pay | Admitting: Nurse Practitioner

## 2023-02-08 NOTE — Telephone Encounter (Signed)
Pt called and states that he has developed diarrhea and he thinks it may be the 2 sodium tablets he is taking. He would like to cut down to 1. Please advise pt at [phone].

## 2023-02-08 NOTE — Telephone Encounter (Signed)
Contacted Gregory [name] to schedule their annual wellness visit. Appointment made for 02/22/23.  Gregory [name] AWV direct phone # [phone]

## 2023-02-17 ENCOUNTER — Encounter: Payer: Self-pay | Admitting: Pulmonary Disease

## 2023-02-17 ENCOUNTER — Ambulatory Visit (INDEPENDENT_AMBULATORY_CARE_PROVIDER_SITE_OTHER): Payer: Medicare Other | Admitting: Pulmonary Disease

## 2023-02-17 VITALS — BP 120/64 | HR 69 | Wt 183.4 lb

## 2023-02-17 DIAGNOSIS — J849 Interstitial pulmonary disease, unspecified: Secondary | ICD-10-CM | POA: Diagnosis not present

## 2023-02-17 MED ORDER — BREZTRI AEROSPHERE 160-9-4.8 MCG/ACT IN AERO: 2.0000 | INHALATION_SPRAY | Freq: Two times a day (BID) | RESPIRATORY_TRACT | 0 refills | Status: DC

## 2023-02-17 NOTE — Patient Instructions (Signed)
Nice to see you   Let's try an inhaler called Breztri - 2 puffs twice a day - rinse mouth after every use  If this helps, send me a mychart message And I can prescribe something similar  Continue albuterol nebulized or inhaled through inhaler as needed  Slowly turn down oxygen - goal is it gets no lower than 88% with exertion  Return to clinic in 3 months or sooner as needed

## 2023-02-17 NOTE — Progress Notes (Addendum)
$'@Patient'i$  ID: Gregory [name], male    DOB: 01/30/1941, 82 y.o.   MRN: [id number]  Chief Complaint  Patient presents with   Follow-up    Follow up for RSV. Pt states he is doing well. Pt is on 4L of 02 and is wanting to get off of the oxygen but I did tell him he might can titrate off it.     Referring provider: [name], NP  HPI:   82 y.o. 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  Discharge summary 12/2022 reviewed.  Most recent pulmonary note x 2 from Roxan Diesel, NP reviewed.  Hospitalized recently with RSV pneumonia.  Groundglass opacities in the bilateral lower lobes and RSV positive.  Treated with prednisone.  Discharged on 6 L oxygen.  Slowly decreasing.  No 4.  Still sounds like drops to 88% with exertion.  Doing well on room air at rest.  Oxygen saturation okay at room air at rest.  Discussed his CT scan in the hospital shows stable peripheral predominant upper lobe but present throughout interlobular septal thickening that is mild.  Unchanged.  Cough is much better, build up stamina.  Is possible he will require oxygen moving forward.  He is at risk with his AVMs as well as mild pulmonary fibrosis.  Is possible his recent pneumonia sitting over the edge is unable to compensate, unable to VQ match with upper lobe predominant very mild fibrosis.   HPI at initial visit: Patient has chronic cough over the last 2 years.  When asked in more detail he admits cough is been present for well over a decade.  Unclear how much change there has been since last seen by pulmonary some years ago.  Does seem that the frequency has worsened over the last couple of years.  Worse at night but occurs throughout the day.  It is nonproductive.  Uses a variety of dextromethorphan containing products that do help, he rotates through these 2 or 3 products as they seem to wear off  and affect.  He is currently on Breo does not seem to help.  Recently resumed gabapentin at low-dose, 100 mg twice daily with mild improvement.  Notes in the past he was on 200 mg twice daily of gabapentin with significant improvement however he had syncope in the setting of what sounds like polypharmacy.  Prior pulmonary notes indicate that he is cough improved on Dulera.  He identifies no other alleviating or exacerbating factors.  No environmental or seasonal factors that contribute to better or worse cough.  No position where things are better or worse.  He is on a PPI twice daily for a long history of GERD.  Does endorse occasional breakthrough symptoms of heartburn, reflux.  Reports endoscopy, manometry, pH probe in the past although these results are all available for review and he does not recall the results.  Reviewed serial CT scans dating back to 2016, with persistent right lower lobe large AVM and small left lower lobe AVM stable from scan 04/2015, 09/2018, 11/2018, 11/2020 with otherwise clear lungs on my interpretation.  PMH: HHT with pulmonary AVMs, hypertension, GERD Surgical history: Brain surgery for brain abscess, Family history: Mother with diabetes, hypertension, father with kidney failure Social history: Lives in [place], retired, went to [employer], has 3 grandchildren  Licensed conveyancer / Pulmonary Flowsheets:   ACT:  No data to display          MMRC:     No data to display          Epworth:      No data to display          Tests:   FENO:  No results found for: "NITRICOXIDE"  PFT:    Latest Ref Rng & Units 07/31/2021   10:49 AM  PFT Results  FVC-Pre L 2.90   FVC-Predicted Pre % 72   FVC-Post L 2.81   FVC-Predicted Post % 69   Pre FEV1/FVC % % 81   Post FEV1/FCV % % 84   FEV1-Pre L 2.34   FEV1-Predicted Pre % 81   FEV1-Post L 2.38   DLCO uncorrected ml/min/mmHg 10.47   DLCO UNC% % 43   DLCO corrected ml/min/mmHg 10.47   DLCO COR  %Predicted % 43   DLVA Predicted % 57   TLC L 5.07   TLC % Predicted % 72   RV % Predicted % 85   Personally reviewed and interpreted as normal spirometry, mildly reduced TLC/mild restriction, severely reduced DLCO.  WALK:     01/20/2023   11:45 AM 01/06/2023   11:53 AM  SIX MIN WALK  Supplimental Oxygen during Test? (L/min) Yes Yes  O2 Flow Rate 3 L/min 5 L/min  Type Continuous Continuous  Tech Comments:  rest and lap #1 were on room air, provided 5L O2 and sats went up   Imaging: Personally reviewed and as per EMR  Lab Results: Personally reviewed CBC    Component Value Date/Time   WBC 15.7 (H) 12/30/2022 0548   RBC 4.25 12/30/2022 0548   HGB 13.1 12/30/2022 0548   HGB 14.8 12/24/2022 0953   HCT 38.2 (L) 12/30/2022 0548   PLT 310 12/30/2022 0548   PLT 245 12/24/2022 0953   MCV 89.9 12/30/2022 0548   MCH 30.8 12/30/2022 0548   MCHC 34.3 12/30/2022 0548   RDW 12.8 12/30/2022 0548   LYMPHSABS 3.3 12/30/2022 0548   MONOABS 1.8 (H) 12/30/2022 0548   EOSABS 0.0 12/30/2022 0548   BASOSABS 0.0 12/30/2022 0548    BMET    Component Value Date/Time   NA 128 (L) 01/04/2023 0605   NA 133 (L) 10/28/2022 1400   K 4.7 01/04/2023 0605   CL 91 (L) 01/04/2023 0605   CO2 29 01/04/2023 0605   GLUCOSE 86 01/04/2023 0605   BUN 17 01/04/2023 0605   BUN 13 10/28/2022 1400   CREATININE 0.85 01/04/2023 0605   CALCIUM 8.8 (L) 01/04/2023 0605   GFRNONAA >60 01/04/2023 0605   GFRAA >60 06/01/2020 1106    BNP    Component Value Date/Time   BNP 37.1 12/30/2022 0548    ProBNP    Component Value Date/Time   PROBNP 126.0 (H) 03/19/2021 0959    Specialty Problems       Pulmonary Problems   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   Seasonal allergic rhinitis due to pollen   Laryngopharyngeal reflux (LPR)   Interstitial lung disease ([place])   On home O2    Allergies  Allergen Reactions   Nsaids Other (See Comments)    Cannot take-per MD   Other Other  (See Comments)    All blood thinners-cannot take per MD   Aspirin Other (See Comments)    nosebleeds   Statins Other (See Comments)    Weakness, myalgias   Demeclocycline Other (See Comments)    Passed out  Dust Mite Extract Other (See Comments)   Tizanidine Hcl Other (See Comments)    Patient stated that after taking this medication he experienced feelings of dizziness and disorientation.     Immunization History  Administered Date(s) Administered   COVID-19, mRNA, vaccine(Comirnaty)12 years and older 10/06/2022   Fluad Quad(high Dose 65+) 08/24/2019, 10/02/2020, 09/11/2021   Influenza Split 09/21/2011, 09/27/2012   Influenza Whole 09/11/2008, 09/11/2009, 09/22/2010   Influenza, High Dose Seasonal PF 09/09/2016, 09/16/2017, 10/03/2018   Influenza, Quadrivalent, Recombinant, Inj, Pf 08/20/2022   Influenza,inj,Quad PF,6+ Mos 10/04/2013, 10/08/2014, 10/02/2015   PFIZER Comirnaty(Gray Top)Covid-19 Tri-Sucrose Vaccine 06/08/2021   PFIZER(Purple Top)SARS-COV-2 Vaccination 01/03/2020, 01/23/2020, 10/18/2020   PNEUMOCOCCAL CONJUGATE-20 04/26/2022   Pfizer Covid-19 Vaccine Bivalent Booster 34yr & up 10/21/2021   Pneumococcal Conjugate-13 12/07/2016   Pneumococcal Polysaccharide-23 03/12/2010, 06/27/2018   Tdap 10/04/2013   Varicella 12/20/2009   Zoster Recombinat (Shingrix) 06/24/2022, 10/25/2022   Zoster, Live 09/06/2022    Past Medical History:  Diagnosis Date   [facility] respiratory [facility] (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 ([facility] [id number]  GERD (gastroesophageal reflux disease)    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  WITHOUT HEMOPTYSIS   History of nonmelanoma skin cancer EXCISION SQUAMOUS CELL FROM HAND   Hypertrophy of prostate with  urinary obstruction and other lower urinary tract symptoms (LUTS)    ILD (interstitial lung disease) (H[facility])    Irritable bowel syndrome    Nocturnal leg cramps 06/11/2013   Plantar fascial fibromatosis    Pure hypercholesterolemia    RSV (respiratory syncytial virus pneumonia) 01/07/2023   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   albuterol (PROVENTIL) (2.5 MG/3ML) 0.083% nebulizer solution INHALE 3 MLS BY NEBULIZATION EVERY 6 HOURS AS NEEDED FOR WHEEZING * DRUG NOT COVERED   albuterol (VENTOLIN HFA) 108 (90 Base) MCG/ACT inhaler Inhale 2 puffs into the lungs every 6 (six) hours as needed for wheezing or shortness of breath.   Ascorbic Acid (VITAMIN C) 1000 MG tablet Take 500 mg by mouth daily.   Budeson-Glycopyrrol-Formoterol (BREZTRI AEROSPHERE) 160-9-4.8 MCG/ACT AERO Inhale 2 puffs into the lungs in the morning and at bedtime.   Cholecalciferol 125 MCG (5000 UT) capsule Take 5,000 Units by mouth daily.   docusate sodium (COLACE) 100 MG capsule Take 200 mg by mouth at bedtime.   fluorometholone (FML) 0.1 % ophthalmic suspension Place 1 drop into both eyes 2 (two) times daily.   fluorouracil (EFUDEX) 5 % cream daily   gabapentin (NEURONTIN) 100 MG capsule Take 1 capsule (100 mg total) by mouth 2 (two) times daily.   Iron, Ferrous Sulfate, 325 (65 Fe) MG TABS Take 325 mg by  mouth in the  morning, at noon, and at bedtime.   levocetirizine (XYZAL) 5 MG tablet Take 1 tablet (5 mg total) by mouth every evening.   levothyroxine (SYNTHROID) 150 MCG tablet Take 1 tablet (150 mcg total) by mouth daily before breakfast.   magic mouthwash (nystatin, hydrocortisone, diphenhydrAMINE) suspension Swish and swallow 5 mLs 3 (three) times daily as needed (mouth and throat irritation).   Magnesium 250 MG TABS Take 250 mg by mouth at bedtime.    meclizine (ANTIVERT) 25 MG tablet Take 1 tablet (25 mg total) by mouth 2 (two) times daily as needed for dizziness.   methocarbamol (ROBAXIN) 500 MG tablet Take 1 tablet (500 mg total) by mouth every 8 (eight) hours as needed for muscle spasms.   montelukast (SINGULAIR) 10 MG tablet TAKE 1 TABLET BY MOUTH AT  BEDTIME (Patient taking differently: Take 10 mg by mouth at bedtime.)   Multiple Vitamin (MULTIVITAMIN WITH MINERALS) TABS tablet Take 1 tablet by mouth every morning.   mupirocin ointment (BACTROBAN) 2 % Apply 1 Application topically 2 (two) times daily. To affected area   pantoprazole (PROTONIX) 40 MG tablet TAKE 1 TABLET BY MOUTH TWICE  DAILY (Patient taking differently: Take 40 mg by mouth daily.)   silodosin (RAPAFLO) 8 MG CAPS capsule Take 1 capsule (8 mg total) by mouth at bedtime.   sodium chloride 1 g tablet Take 1 tablet (1 g total) by mouth 2 (two) times daily with a meal.   valsartan (DIOVAN) 40 MG tablet Take 1 tablet (40 mg total) by mouth daily.   No facility-administered encounter medications on file as of 02/17/2023.     Review of Systems  Review of Systems  N/a Physical Exam  BP 120/64 (BP Location: Left Arm, Patient Position: Sitting, Cuff Size: Normal)   Pulse 69   Wt 183 lb 6.8 oz (83.2 kg)   SpO2 94%   BMI 27.89 kg/m   Wt Readings from Last 5 Encounters:  02/17/23 183 lb 6.8 oz (83.2 kg)  01/24/23 181 lb 3.2 oz (82.2 kg)  01/20/23 180 lb 9.6 oz (81.9 kg)  01/07/23 184 lb 3.2 oz (83.6 kg)  01/06/23 180 lb 4.8 oz  (81.8 kg)    BMI Readings from Last 5 Encounters:  02/17/23 27.89 kg/m  01/24/23 27.55 kg/m  01/20/23 27.46 kg/m  01/07/23 33.15 kg/m  01/06/23 26.63 kg/m     Physical Exam General: Well-appearing, in no acute distress Eyes: EOMI, no icterus Neck: Supple no JVP Cardiovascular: Regular in rhythm, no murmurs Pulmonary: Clear to auscultation bilaterally, no wheezing Abdomen: Nondistended, bowel sounds present MSK: No synovitis, no joint effusion Neuro: Normal gait, no weakness Psych: Normal mood, full affect   Assessment & Plan:   Chronic Cough: Likely multifactorial. Improved with Dulera in past although more recently Breo did not help. Begs question of cough variant asthma.  Did not improve with high-dose Dulera or low-dose gabapentin.  Has improved recently with prolonged steroid therapy.  DOE: Likely multifactorial.  Worsening in the summer 2022 related to anemia, hemoglobin found to be in 6s.  Likely chronic GI losses in the setting of his AVM.  On iron supplementation and improving.  Likely some contribution from mild but stable signs of interstitial lung disease on CT chest.  AVM stable but likely contributing.  Trial of Breztri given recent RSV infection to see if this helps aid in recovery.  Historically, inhalers have not helped with cough.  But may help in this situation with dyspnea on  exertion, ongoing inflammation.  If helpful, he is to contact me and I will prescribe, samples provided today.  Hypoxemic respiratory failure: Multifactorial mostly driven by recent RSV infection.  Possibly had desaturation prior to this not noted before.  His DLCO has been severely reduced in setting of AVM and mild pulmonary fibrosis.  Instructed to decrease with oxygen saturation goal 88% or higher.  Can decrease flow rate gradually as long as with exertion oxygen staying 88% or higher.  He is doing well on room air sats in the mid 90s at rest on room air.  Pulmonary AVMs: Repeat CT  January 2024 showed stable AVMs.   Return in about 3 months (around 05/18/2023).   Lanier Clam, MD 02/17/2023  I spent 45 minutes in care of this patient including face-to-face visit, review of records, coordination of care.

## 2023-02-21 ENCOUNTER — Other Ambulatory Visit (HOSPITAL_BASED_OUTPATIENT_CLINIC_OR_DEPARTMENT_OTHER): Payer: Self-pay | Admitting: Nurse Practitioner

## 2023-02-21 DIAGNOSIS — I10 Essential (primary) hypertension: Secondary | ICD-10-CM

## 2023-02-22 ENCOUNTER — Other Ambulatory Visit: Payer: Self-pay

## 2023-02-22 ENCOUNTER — Ambulatory Visit (INDEPENDENT_AMBULATORY_CARE_PROVIDER_SITE_OTHER): Payer: Medicare Other

## 2023-02-22 ENCOUNTER — Telehealth: Payer: Self-pay | Admitting: Nurse Practitioner

## 2023-02-22 VITALS — Ht 69.0 in | Wt 175.0 lb

## 2023-02-22 DIAGNOSIS — Z0000 Encounter for general adult medical examination without abnormal findings: Secondary | ICD-10-CM

## 2023-02-22 DIAGNOSIS — I10 Essential (primary) hypertension: Secondary | ICD-10-CM

## 2023-02-22 MED ORDER — VALSARTAN 40 MG PO TABS: 40.0000 mg | ORAL_TABLET | Freq: Every day | ORAL | 1 refills | Status: DC

## 2023-02-22 NOTE — Progress Notes (Cosign Needed Addendum)
I connected with  Gregory [name] on 02/22/23 by a audio enabled telemedicine application and verified that I am speaking with the correct person using two identifiers.  Patient Location: Home  Provider Location: Office/Clinic  I discussed the limitations of evaluation and management by telemedicine. The patient expressed understanding and agreed to proceed.  Subjective:   Gregory [name] is a 82 y.o. male who presents for an Initial Medicare Annual Wellness Visit.  Review of Systems     Cardiac Risk Factors include: advanced age (>6mn, >>81women);dyslipidemia;hypertension;male gender     Objective:    Today's Vitals   02/22/23 1016  Weight: 175 lb (79.4 kg)  Height: '5\' 9"'$  (1.753 m)   Body mass index is 25.84 kg/m.     02/22/2023   10:20 AM 11/26/2022    6:01 PM 09/23/2022    2:52 PM 08/04/2022    8:41 AM 05/21/2022   10:21 AM 03/23/2022    9:20 AM 03/11/2022    9:17 AM  Advanced Directives  Does Patient Have a Medical Advance Directive? Yes No Yes Yes Yes No Yes  Type of AParamed[place]of [place]  [place];Out of facility DNR (pink MOST or yellow form) [place] Living will;Healthcare Power of Attorney   Does patient want to make changes to medical advance directive?   No - Patient declined   No - Patient declined   Copy of [place]? No - copy requested  No - copy requested      Would patient like information on creating a medical advance directive?  No - Patient declined    No - Patient declined     Current Medications (verified) Outpatient Encounter Medications as of 02/22/2023  Medication Sig   acetaminophen (TYLENOL) 325 MG tablet Take 2 tablets (650 mg total) by mouth every 6 (six) hours as needed for mild pain (or Fever >/= 101).   albuterol (PROVENTIL) (2.5 MG/3ML) 0.083% nebulizer solution INHALE 3 MLS  BY NEBULIZATION EVERY 6 HOURS AS NEEDED FOR WHEEZING * DRUG NOT COVERED   albuterol (VENTOLIN HFA) 108 (90 Base) MCG/ACT inhaler Inhale 2 puffs into the lungs every 6 (six) hours as needed for wheezing or shortness of breath.   Ascorbic Acid (VITAMIN C) 1000 MG tablet Take 500 mg by mouth daily.   Budeson-Glycopyrrol-Formoterol (BREZTRI AEROSPHERE) 160-9-4.8 MCG/ACT AERO Inhale 2 puffs into the lungs in the morning and at bedtime.   Cholecalciferol 125 MCG (5000 UT) capsule Take 5,000 Units by mouth daily.   docusate sodium (COLACE) 100 MG capsule Take 200 mg by mouth at bedtime.   fluorometholone (FML) 0.1 % ophthalmic suspension Place 1 drop into both eyes 2 (two) times daily.   fluorouracil (EFUDEX) 5 % cream daily   gabapentin (NEURONTIN) 100 MG capsule Take 1 capsule (100 mg total) by mouth 2 (two) times daily.   Iron, Ferrous Sulfate, 325 (65 Fe) MG TABS Take 325 mg by mouth in the morning, at noon, and at bedtime.   levocetirizine (XYZAL) 5 MG tablet Take 1 tablet (5 mg total) by mouth every evening.   levothyroxine (SYNTHROID) 150 MCG tablet Take 1 tablet (150 mcg total) by mouth daily before breakfast.   magic mouthwash (nystatin, hydrocortisone, diphenhydrAMINE) suspension Swish and swallow 5 mLs 3 (three) times daily as needed (mouth and throat irritation).   Magnesium 250 MG TABS Take 250 mg by mouth at bedtime.  meclizine (ANTIVERT) 25 MG tablet Take 1 tablet (25 mg total) by mouth 2 (two) times daily as needed for dizziness.   methocarbamol (ROBAXIN) 500 MG tablet Take 1 tablet (500 mg total) by mouth every 8 (eight) hours as needed for muscle spasms.   montelukast (SINGULAIR) 10 MG tablet TAKE 1 TABLET BY MOUTH AT  BEDTIME (Patient taking differently: Take 10 mg by mouth at bedtime.)   Multiple Vitamin (MULTIVITAMIN WITH MINERALS) TABS tablet Take 1 tablet by mouth every morning.   mupirocin ointment (BACTROBAN) 2 % Apply 1 Application topically 2 (two) times daily. To affected  area   pantoprazole (PROTONIX) 40 MG tablet TAKE 1 TABLET BY MOUTH TWICE  DAILY (Patient taking differently: Take 40 mg by mouth daily.)   silodosin (RAPAFLO) 8 MG CAPS capsule Take 1 capsule (8 mg total) by mouth at bedtime.   sodium chloride 1 g tablet Take 1 tablet (1 g total) by mouth 2 (two) times daily with a meal.   valsartan (DIOVAN) 40 MG tablet TAKE 1 TABLET BY MOUTH EVERY DAY   No facility-administered encounter medications on file as of 02/22/2023.    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  WITHOUT HEMOPTYSIS   History of nonmelanoma skin cancer EXCISION SQUAMOUS CELL FROM HAND   Hypertrophy of prostate with urinary obstruction and other lower urinary tract symptoms (LUTS)    ILD (interstitial lung disease) (HCC)    Irritable bowel syndrome    Nocturnal leg cramps 06/11/2013   Plantar fascial fibromatosis    Pure hypercholesterolemia    RSV (respiratory syncytial virus pneumonia) 01/07/2023   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    Symptomatic anemia 08/22/2021   Telangiectasia, hereditary hemorrhagic, 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, [place]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  ([facility])   SKIN, LIPS, NASAL W/ PREVIOUS NOSE BLEEDS  AMD GI TELANGIECTASIA   Unspecified hypothyroidism    Past Surgical History:  Procedure Laterality Date   APPLICATION OF CRANIAL NAVIGATION N/A 05/16/2015   Procedure: APPLICATION OF CRANIAL NAVIGATION;  Surgeon: Consuella Lose, MD;  Location: [facility]  NEURO ORS;  Service: Neurosurgery;  Laterality: N/A;   BRAIN BIOPSY Left 05/16/2015   Procedure: Stereotactic Left Brain Biopsy with Brain Lab;  Surgeon: Consuella Lose, MD;  Location: [facility] NEURO ORS;  Service: Neurosurgery;  Laterality: Left;  Stereotactic Left Brain biopsy with brainlab   BRAIN SURGERY     CARDIOVASCULAR STRESS TEST  01/14/2003   NO ISCHEMIA / EF 61%/ NORMAL LE WALL MOTION   EXCISION LEFT WRIST GANGLIAN/ MYXOID CYST  05/30/2009   IR RADIOLOGIST EVAL & MGMT  02/01/2019   NASAL SINUS SURGERY     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 Renda Rolls   TRANSTHORACIC ECHOCARDIOGRAM  10-17-2008   DR CRENSHAW   NORMAL LVF/ EF 60%/  MILDLY DILATED RIGHT ATRIUM/ VENTRICULE   VARICOCELECTOMY  1991   Dr. Tresa Endo   Family History  Problem Relation Age of Onset   Diabetes Mother    Hypertension Mother    Pancreatic cancer Mother    Stroke Mother    Kidney failure Father    Hypertension Sister    Healthy Son    Social History   Socioeconomic History   Marital status: Married    Spouse name: Ann   Number of children: 2   Years of education: Not on file   Highest education level: Master's degree (e.g., MA, MS, MEng, MEd, MSW, MBA)  Occupational History    Comment: Consultant   Occupation: retired  Tobacco Use   Smoking status: Never   Smokeless tobacco: Never  Vaping Use   Vaping Use: Never used  Substance and Sexual Activity   Alcohol use: No    Alcohol/week: 0.0 standard drinks of alcohol   Drug use: No   Sexual activity: Not on file  Other Topics Concern   Not on file  Social History Narrative   Lives with wife in a one story home.  Has 2 sons.  Retired Veterinary surgeon.  Education: Masters degree.      05/09/19- Pt states that his balance is worse since last visit. He has  not fallen but has come close on a few occassions. The weakness on his right side is the same, he states.      Right Handed   Drinks caffeine    Social Determinants of Health   Financial Resource Strain: Low Risk  (02/22/2023)   Overall Financial Resource Strain (CARDIA)    Difficulty of Paying Living Expenses: Not hard at all  Food Insecurity: No Food Insecurity (02/22/2023)   Hunger Vital Sign    Worried About Running Out of Food in the Last Year: Never true    Ran Out of Food in the Last Year: Never true  Transportation Needs: No Transportation Needs (02/22/2023)   PRAPARE - Hydrologist (Medical): No    Lack of Transportation (Non-Medical): No  Physical Activity: Insufficiently Active (02/22/2023)   Exercise Vital Sign    Days of Exercise per Week: 3 days    Minutes of Exercise per Session: 30 min  Stress: No Stress Concern Present (02/22/2023)   [place]    Feeling of Stress : Not at all  Social Connections: Not on file    Tobacco Counseling Counseling given: Not Answered   Clinical Intake:  Pre-visit preparation completed: Yes  Pain : No/denies pain     Nutritional Status: BMI 25 -29 Overweight Nutritional Risks: None Diabetes: No  How often do you need to have someone help you when you read instructions, pamphlets, or other written materials from your doctor or pharmacy?: 1 - Never  Diabetic? no  Interpreter Needed?: No  Information entered by :: NAllen LPN   Activities of Daily Living    02/22/2023   10:22 AM 02/18/2023    9:45 AM  In your present state of health, do you have any difficulty performing the following activities:  Hearing? 0 0  Vision? 0 0  Difficulty concentrating or making decisions? 0 0  Walking or climbing stairs? 0 0  Dressing or bathing? 0 0  Doing errands, shopping? 0 0  Preparing Food and eating ? N N  Using the Toilet? N N  In the past six  months, have you accidently leaked urine? Y Y  Comment due to prostate surgery   Do you have problems with loss of bowel control? N N  Managing your Medications? N N  Managing your Finances? N N  Housekeeping or managing your Housekeeping? N N    Patient Care Team: Early, [name], NP as PCP - General (Nurse Practitioner) Lelon Perla, MD as PCP - Cardiology (Cardiology) Alda Berthold, DO as Consulting Physician (Neurology) Charlton Haws, [facility] as Pharmacist (Pharmacist)  Indicate any recent Medical Services you may have received from other than [facility] providers in the past year (date may be approximate).     Assessment:   This is a routine wellness examination for [place].  Hearing/Vision screen Vision Screening - Comments:: Regular eye exams, [facility]  Dietary issues and exercise activities discussed: Current Exercise Habits: Home exercise routine, Type of exercise: calisthenics, Time (Minutes): 30, Frequency (Times/Week): 3, Weekly Exercise (Minutes/Week): 90   Goals Addressed             This Visit's Progress    Patient Stated       02/22/2023, stay healthy       Depression Screen    02/22/2023   10:21 AM 09/06/2022    8:54 AM 04/26/2022    1:06 PM 02/16/2022    8:55 PM 06/09/2020    2:04 PM 06/12/2019    1:12 PM 06/27/2018    9:56 AM  PHQ 2/9 Scores  PHQ - 2 Score 0 0 0 0 0 0 0  PHQ- 9 Score 0 1       Exception Documentation   Medical reason        Fall Risk    02/22/2023   10:20 AM 02/18/2023    9:45 AM 09/06/2022    8:54 AM 08/04/2022    8:40 AM 04/26/2022    1:06 PM  Fall Risk   Falls in the past year? '1 1 1 1 '$ 0  Comment tripped over threshold      Number falls in past yr: '1 1 1 '$ 0 0  Injury with Fall? 0 0 0 0 0  Risk for fall due to : Medication side effect;History of fall(s);Impaired balance/gait;Impaired mobility  Other (Comment)  No Fall Risks  Follow up Falls prevention discussed;Education provided;Falls evaluation completed  Falls evaluation  completed;Education provided  Falls evaluation completed;Education provided    FALL RISK PREVENTION PERTAINING TO THE HOME:  Any stairs in or around the home? Yes  If so, are there any without handrails? No  Home free of loose throw rugs in walkways, pet beds, electrical cords, etc? Yes  Adequate lighting in your home to reduce risk of falls? Yes   ASSISTIVE DEVICES UTILIZED TO PREVENT FALLS:  Life alert? No  Use of a cane, walker or w/c? No  Grab bars in the bathroom? Yes  Shower chair or bench in shower? No  Elevated toilet seat or a handicapped toilet? Yes   TIMED UP AND GO:  Was the test performed? No .      Cognitive Function:        02/22/2023   10:23 AM  6CIT Screen  What Year? 0 points  What month? 0 points  What time? 0 points  Count back from 20 0 points  Months in reverse 0 points  Repeat phrase 2 points  Total Score 2 points    Immunizations Immunization History  Administered Date(s) Administered   COVID-19, mRNA, vaccine(Comirnaty)12 years and  older 10/06/2022   Fluad Quad(high Dose 65+) 08/24/2019, 10/02/2020, 09/11/2021   Influenza Split 09/21/2011, 09/27/2012   Influenza Whole 09/11/2008, 09/11/2009, 09/22/2010   Influenza, High Dose Seasonal PF 09/09/2016, 09/16/2017, 10/03/2018   Influenza, Quadrivalent, Recombinant, Inj, Pf 08/20/2022   Influenza,inj,Quad PF,6+ Mos 10/04/2013, 10/08/2014, 10/02/2015   PFIZER Comirnaty(Gray Top)Covid-19 Tri-Sucrose Vaccine 06/08/2021   PFIZER(Purple Top)SARS-COV-2 Vaccination 01/03/2020, 01/23/2020, 10/18/2020   PNEUMOCOCCAL CONJUGATE-20 04/26/2022   Pfizer Covid-19 Vaccine Bivalent Booster 4yr & up 10/21/2021   Pneumococcal Conjugate-13 12/07/2016   Pneumococcal Polysaccharide-23 03/12/2010, 06/27/2018   Tdap 10/04/2013   Varicella 12/20/2009   Zoster Recombinat (Shingrix) 06/24/2022, 10/25/2022   Zoster, Live 09/06/2022    TDAP status: Up to date  Flu Vaccine status: Up to date  Pneumococcal  vaccine status: Up to date  Covid-19 vaccine status: Completed vaccines  Qualifies for Shingles Vaccine? Yes   Zostavax completed Yes   Shingrix Completed?: Yes  Screening Tests Health Maintenance  Topic Date Due   Medicare Annual Wellness (AWV)  Never done   FOOT EXAM  Never done   OPHTHALMOLOGY EXAM  Never done   Diabetic kidney evaluation - Urine ACR  Never done   HEMOGLOBIN A1C  04/25/2022   COVID-19 Vaccine (7 - 2023-24 season) 12/01/2022   DTaP/Tdap/Td (2 - Td or Tdap) 10/05/2023   Diabetic kidney evaluation - eGFR measurement  01/05/2024   COLONOSCOPY (Pts 45-468yrInsurance coverage will need to be confirmed)  06/29/2026   Pneumonia Vaccine 65101Years old  Completed   INFLUENZA VACCINE  Completed   Zoster Vaccines- Shingrix  Completed   HPV VACCINES  Aged Out    Health Maintenance  Health Maintenance Due  Topic Date Due   Medicare Annual Wellness (AWV)  Never done   FOOT EXAM  Never done   OPHTHALMOLOGY EXAM  Never done   Diabetic kidney evaluation - Urine ACR  Never done   HEMOGLOBIN A1C  04/25/2022   COVID-19 Vaccine (7 - 2023-24 season) 12/01/2022    Colorectal cancer screening: Type of screening: Colonoscopy. Completed 06/29/2021. Repeat every 5 years  Lung Cancer Screening: (Low Dose CT Chest recommended if Age 81-80ears, 30 pack-year currently smoking OR have quit w/in 15years.) does not qualify.   Lung Cancer Screening Referral: no  Additional Screening:  Hepatitis C Screening: does not qualify;   Vision Screening: Recommended annual ophthalmology exams for early detection of glaucoma and other disorders of the eye. Is the patient up to date with their annual eye exam?  Yes  Who is the provider or what is the name of the office in which the patient attends annual eye exams? [facility] If pt is not established with a provider, would they like to be referred to a provider to establish care? No .   Dental Screening: Recommended annual dental exams  for proper oral hygiene  Community Resource Referral / Chronic Care Management: CRR required this visit?  No   CCM required this visit?  No      Plan:     I have personally reviewed and noted the following in the patient's chart:   Medical and social history Use of alcohol, tobacco or illicit drugs  Current medications and supplements including opioid prescriptions. Patient is not currently taking opioid prescriptions. Functional ability and status Nutritional status Physical activity Advanced directives List of other physicians Hospitalizations, surgeries, and ER visits in previous 12 months Vitals Screenings to include cognitive, depression, and falls Referrals and appointments  In addition, I have  reviewed and discussed with patient certain preventive protocols, quality metrics, and best practice recommendations. A written personalized care plan for preventive services as well as general preventive health recommendations were provided to patient.     Kellie Simmering, LPN   [id number]   Nurse Notes: none  Due to this being a virtual visit, the after visit summary with patients personalized plan was offered to patient via mail or my-chart. Patient would like to access on my-chart

## 2023-02-22 NOTE — Telephone Encounter (Signed)
Pt wants Valsartan rx moved to [facility]  He can get there at no cost

## 2023-02-22 NOTE — Patient Instructions (Signed)
Gregory [name] , Thank you for taking time to come for your Medicare Wellness Visit. I appreciate your ongoing commitment to your health goals. Please review the following plan we discussed and let me know if I can assist you in the future.   These are the goals we discussed:  Goals      LDL CALC < 100     Manage My Medicine     Timeframe:  Long-Range Goal Priority:  Medium Start Date:    05/21/21                         Expected End Date:    05/21/22                   Follow Up Date Dec 2022   - call for medicine refill 2 or 3 days before it runs out - call if I am sick and can't take my medicine - keep a list of all the medicines I take; vitamins and herbals too - use a pillbox to sort medicine -Do not use Delsym and Robitussin together - they are the same thing    Why is this important?   These steps will help you keep on track with your medicines.   Notes:      Patient Stated     02/22/2023, stay healthy        This is a list of the screening recommended for you and due dates:  Health Maintenance  Topic Date Due   Complete foot exam   Never done   Eye exam for diabetics  Never done   Yearly kidney health urinalysis for diabetes  Never done   Hemoglobin A1C  04/25/2022   COVID-19 Vaccine (7 - 2023-24 season) 12/01/2022   DTaP/Tdap/Td vaccine (2 - Td or Tdap) 10/05/2023   Yearly kidney function blood test for diabetes  01/05/2024   Medicare Annual Wellness Visit  02/22/2024   Colon Cancer Screening  06/29/2026   Pneumonia Vaccine  Completed   Flu Shot  Completed   Zoster (Shingles) Vaccine  Completed   HPV Vaccine  Aged Out    Advanced directives: Please bring a copy of your POA (Power of [place]) and/or Living Will to your next appointment.   Conditions/risks identified: none  Next appointment: Follow up in one year for your annual wellness visit.   Preventive Care 58 Years and Older, Male  Preventive care refers to lifestyle choices and visits with your health  care provider that can promote health and wellness. What does preventive care include? A yearly physical exam. This is also called an annual well check. Dental exams once or twice a year. Routine eye exams. Ask your health care provider how often you should have your eyes checked. Personal lifestyle choices, including: Daily care of your teeth and gums. Regular physical activity. Eating a healthy diet. Avoiding tobacco and drug use. Limiting alcohol use. Practicing safe sex. Taking low doses of aspirin every day. Taking vitamin and mineral supplements as recommended by your health care provider. What happens during an annual well check? The services and screenings done by your health care provider during your annual well check will depend on your age, overall health, lifestyle risk factors, and family history of disease. Counseling  Your health care provider may ask you questions about your: Alcohol use. Tobacco use. Drug use. Emotional well-being. Home and relationship well-being. Sexual activity. Eating habits. History of falls. Memory and ability to  understand (cognition). Work and work Statistician. Screening  You may have the following tests or measurements: Height, weight, and BMI. Blood pressure. Lipid and cholesterol levels. These may be checked every 5 years, or more frequently if you are over 23 years old. Skin check. Lung cancer screening. You may have this screening every year starting at age 51 if you have a 30-pack-year history of smoking and currently smoke or have quit within the past 15 years. Fecal occult blood test (FOBT) of the stool. You may have this test every year starting at age 69. Flexible sigmoidoscopy or colonoscopy. You may have a sigmoidoscopy every 5 years or a colonoscopy every 10 years starting at age 4. Prostate cancer screening. Recommendations will vary depending on your family history and other risks. Hepatitis C blood test. Hepatitis B  blood test. Sexually transmitted disease (STD) testing. Diabetes screening. This is done by checking your blood sugar (glucose) after you have not eaten for a while (fasting). You may have this done every 1-3 years. Abdominal aortic aneurysm (AAA) screening. You may need this if you are a current or former smoker. Osteoporosis. You may be screened starting at age 36 if you are at high risk. Talk with your health care provider about your test results, treatment options, and if necessary, the need for more tests. Vaccines  Your health care provider may recommend certain vaccines, such as: Influenza vaccine. This is recommended every year. Tetanus, diphtheria, and acellular pertussis (Tdap, Td) vaccine. You may need a Td booster every 10 years. Zoster vaccine. You may need this after age 67. Pneumococcal 13-valent conjugate (PCV13) vaccine. One dose is recommended after age 8. Pneumococcal polysaccharide (PPSV23) vaccine. One dose is recommended after age 38. Talk to your health care provider about which screenings and vaccines you need and how often you need them. This information is not intended to replace advice given to you by your health care provider. Make sure you discuss any questions you have with your health care provider. Document Released: 01/02/2016 Document Revised: 08/25/2016 Document Reviewed: 10/07/2015 Elsevier Interactive Patient Education  2017 [place] Prevention in the Home Falls can cause injuries. They can happen to people of all ages. There are many things you can do to make your home safe and to help prevent falls. What can I do on the outside of my home? Regularly fix the edges of walkways and driveways and fix any cracks. Remove anything that might make you trip as you walk through a door, such as a raised step or threshold. Trim any bushes or trees on the path to your home. Use bright outdoor lighting. Clear any walking paths of anything that might make  someone trip, such as rocks or tools. Regularly check to see if handrails are loose or broken. Make sure that both sides of any steps have handrails. Any raised decks and porches should have guardrails on the edges. Have any leaves, snow, or ice cleared regularly. Use sand or salt on walking paths during winter. Clean up any spills in your garage right away. This includes oil or grease spills. What can I do in the bathroom? Use night lights. Install grab bars by the toilet and in the tub and shower. Do not use towel bars as grab bars. Use non-skid mats or decals in the tub or shower. If you need to sit down in the shower, use a plastic, non-slip stool. Keep the floor dry. Clean up any water that spills on the floor as  soon as it happens. Remove soap buildup in the tub or shower regularly. Attach bath mats securely with double-sided non-slip rug tape. Do not have throw rugs and other things on the floor that can make you trip. What can I do in the bedroom? Use night lights. Make sure that you have a light by your bed that is easy to reach. Do not use any sheets or blankets that are too big for your bed. They should not hang down onto the floor. Have a firm chair that has side arms. You can use this for support while you get dressed. Do not have throw rugs and other things on the floor that can make you trip. What can I do in the kitchen? Clean up any spills right away. Avoid walking on wet floors. Keep items that you use a lot in easy-to-reach places. If you need to reach something above you, use a strong step stool that has a grab bar. Keep electrical cords out of the way. Do not use floor polish or wax that makes floors slippery. If you must use wax, use non-skid floor wax. Do not have throw rugs and other things on the floor that can make you trip. What can I do with my stairs? Do not leave any items on the stairs. Make sure that there are handrails on both sides of the stairs and  use them. Fix handrails that are broken or loose. Make sure that handrails are as long as the stairways. Check any carpeting to make sure that it is firmly attached to the stairs. Fix any carpet that is loose or worn. Avoid having throw rugs at the top or bottom of the stairs. If you do have throw rugs, attach them to the floor with carpet tape. Make sure that you have a light switch at the top of the stairs and the bottom of the stairs. If you do not have them, ask someone to add them for you. What else can I do to help prevent falls? Wear shoes that: Do not have high heels. Have rubber bottoms. Are comfortable and fit you well. Are closed at the toe. Do not wear sandals. If you use a stepladder: Make sure that it is fully opened. Do not climb a closed stepladder. Make sure that both sides of the stepladder are locked into place. Ask someone to hold it for you, if possible. Clearly mark and make sure that you can see: Any grab bars or handrails. First and last steps. Where the edge of each step is. Use tools that help you move around (mobility aids) if they are needed. These include: Canes. Walkers. Scooters. Crutches. Turn on the lights when you go into a dark area. Replace any light bulbs as soon as they burn out. Set up your furniture so you have a clear path. Avoid moving your furniture around. If any of your floors are uneven, fix them. If there are any pets around you, be aware of where they are. Review your medicines with your doctor. Some medicines can make you feel dizzy. This can increase your chance of falling. Ask your doctor what other things that you can do to help prevent falls. This information is not intended to replace advice given to you by your health care provider. Make sure you discuss any questions you have with your health care provider. Document Released: 10/02/2009 Document Revised: 05/13/2016 Document Reviewed: 01/10/2015 Elsevier Interactive Patient  Education  2017 [employer].

## 2023-03-25 ENCOUNTER — Inpatient Hospital Stay: Payer: Medicare Other

## 2023-03-25 ENCOUNTER — Inpatient Hospital Stay: Payer: Medicare Other | Attending: Nurse Practitioner | Admitting: Oncology

## 2023-03-25 ENCOUNTER — Encounter: Payer: Self-pay | Admitting: Oncology

## 2023-03-25 VITALS — BP 105/46 | HR 65 | Temp 97.6°F | Resp 16 | Wt 185.3 lb

## 2023-03-25 DIAGNOSIS — D500 Iron deficiency anemia secondary to blood loss (chronic): Secondary | ICD-10-CM | POA: Insufficient documentation

## 2023-03-25 DIAGNOSIS — Z9981 Dependence on supplemental oxygen: Secondary | ICD-10-CM | POA: Diagnosis not present

## 2023-03-25 DIAGNOSIS — Q2572 Congenital pulmonary arteriovenous malformation: Secondary | ICD-10-CM | POA: Insufficient documentation

## 2023-03-25 DIAGNOSIS — I780 Hereditary hemorrhagic telangiectasia: Secondary | ICD-10-CM | POA: Diagnosis present

## 2023-03-25 LAB — CBC WITH DIFFERENTIAL (CANCER CENTER ONLY)
Abs Immature Granulocytes: 0.03 10*3/uL (ref 0.00–0.07)
Basophils Absolute: 0 10*3/uL (ref 0.0–0.1)
Basophils Relative: 1 %
Eosinophils Absolute: 0.1 10*3/uL (ref 0.0–0.5)
Eosinophils Relative: 1 %
HCT: 41.1 % (ref 39.0–52.0)
Hemoglobin: 14.3 g/dL (ref 13.0–17.0)
Immature Granulocytes: 1 %
Lymphocytes Relative: 34 %
Lymphs Abs: 2.2 10*3/uL (ref 0.7–4.0)
MCH: 31.6 pg (ref 26.0–34.0)
MCHC: 34.8 g/dL (ref 30.0–36.0)
MCV: 90.9 fL (ref 80.0–100.0)
Monocytes Absolute: 0.6 10*3/uL (ref 0.1–1.0)
Monocytes Relative: 10 %
Neutro Abs: 3.6 10*3/uL (ref 1.7–7.7)
Neutrophils Relative %: 53 %
Platelet Count: 287 10*3/uL (ref 150–400)
RBC: 4.52 MIL/uL (ref 4.22–5.81)
RDW: 13.5 % (ref 11.5–15.5)
WBC Count: 6.5 10*3/uL (ref 4.0–10.5)
nRBC: 0 % (ref 0.0–0.2)

## 2023-03-25 LAB — FERRITIN: Ferritin: 22 ng/mL — ABNORMAL LOW (ref 24–336)

## 2023-03-25 NOTE — Progress Notes (Signed)
   [facility] OFFICE PROGRESS NOTE   Diagnosis: HHT  INTERVAL HISTORY:   Mr Ziehm returns as scheduled.  He continues iron.  The stool is dark.  No apparent bleeding.  He was admitted in January with RSV pneumonia.  He is now oxygen dependent.  He is followed by pulmonary medicine.  Objective:  Vital signs in last 24 hours:  Blood pressure (!) 105/46, pulse 65, temperature 97.6 F (36.4 C), temperature source Oral, resp. rate 16, weight 185 lb 4.8 oz (84.1 kg), SpO2 94 %.    HEENT: Multiple oral telangiectasia Resp: Coarse end inspiratory rhonchi at the posterior base bilaterally, no respiratory distress Cardio: Regular rate and rhythm GI: No hepatosplenomegaly Vascular: No leg edema   Lab Results  Component Value Date   WBC 6.5 03/25/2023   HGB 14.3 03/25/2023   HCT 41.1 03/25/2023   MCV 90.9 03/25/2023   PLT 287 03/25/2023   NEUTROABS 3.6 03/25/2023   Ferritin-22 CMP  Lab Results  Component Value Date   NA 128 (L) 01/04/2023   K 4.7 01/04/2023   CL 91 (L) 01/04/2023   CO2 29 01/04/2023   GLUCOSE 86 01/04/2023   BUN 17 01/04/2023   CREATININE 0.85 01/04/2023   CALCIUM 8.8 (L) 01/04/2023   PROT 7.5 12/26/2022   ALBUMIN 4.6 12/26/2022   AST 30 12/26/2022   ALT 24 12/26/2022   ALKPHOS 59 12/26/2022   BILITOT 0.3 12/26/2022   GFRNONAA >60 01/04/2023   GFRAA >60 06/01/2020    Medications: I have reviewed the patient's current medications.   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  He continues oral iron therapy.  The ferritin is borderline low today.  He is not anemic.  He will continue iron.  He will call for symptoms of anemia.  He will return for a CBC and ferritin level in 3 months.  He will return for office visit in 6 months.  He continues follow-up with pulmonary medicine for management of respiratory failure.  Thornton Papas, MD  03/25/2023  10:38 AM

## 2023-04-05 ENCOUNTER — Telehealth: Payer: Self-pay | Admitting: Pulmonary Disease

## 2023-04-05 NOTE — Telephone Encounter (Signed)
Pt called the office stating that he had RSV 12/2022. Due to pt having RSV, he wanted to know if it would be okay for him to go ahead and get the vaccine. Dr. Judeth Horn please advise if pt should be okay to get it or if there is a timeframe that he needs to wait prior to being able to get this.

## 2023-04-05 NOTE — Telephone Encounter (Signed)
Ok to get RSV vaccine. I assume he will need re-vaccination in fall. Since virus season is almost over, would be reasonable to just wait until fall if he prefers. Thanks!

## 2023-04-06 NOTE — Telephone Encounter (Signed)
nfn 

## 2023-04-06 NOTE — Telephone Encounter (Signed)
ATC X1 unable to leave message. Sounded like someone picked up the phone. I said hello several times then hung up from no response. If patient calls back please advise of Dr. Jodene Nam reccomendations

## 2023-04-06 NOTE — Telephone Encounter (Signed)
PT did call back. I read notes from Dr. Rexene Edison. He expressed understanding.

## 2023-04-18 ENCOUNTER — Encounter: Payer: Self-pay | Admitting: Pulmonary Disease

## 2023-04-18 ENCOUNTER — Ambulatory Visit (INDEPENDENT_AMBULATORY_CARE_PROVIDER_SITE_OTHER): Payer: Medicare Other | Admitting: Pulmonary Disease

## 2023-04-18 VITALS — BP 144/62 | HR 82 | Temp 97.7°F | Ht 69.0 in | Wt 187.0 lb

## 2023-04-18 DIAGNOSIS — G4734 Idiopathic sleep related nonobstructive alveolar hypoventilation: Secondary | ICD-10-CM | POA: Diagnosis not present

## 2023-04-18 DIAGNOSIS — J849 Interstitial pulmonary disease, unspecified: Secondary | ICD-10-CM | POA: Diagnosis not present

## 2023-04-18 NOTE — Patient Instructions (Signed)
Nice to see you again  Will walk around clinic today to see if you desaturate  If you do not desaturate and I will order an overnight oximetry test to be done on room air  I ordered a CT scan to check on any residual scarring or inflammation left behind from the RSV  Return to clinic in 6 months or sooner as needed, may need to change follow-up based on results of your CT scan.

## 2023-04-18 NOTE — Progress Notes (Signed)
@Patient  ID: Gregory [name], male    DOB: 1941-11-02, 82 y.o.   MRN: [id number]  Chief Complaint  Patient presents with   Follow-up    ILD, states he is feeling well, using O2 as needed with exertion    Referring provider: Early, Sung Amabile, NP  HPI:   82 y.o. 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   Overall doing well.  Recovering from RSV infection.  No longer using oxygen during the day for the most part.  Occasionally uses things outside moving around.  His oxygen saturation mainly in the mid 90s.  Ambulated around clinic on room air and did not desaturate.  HPI at initial visit: Patient has chronic cough over the last 2 years.  When asked in more detail he admits cough is been present for well over a decade.  Unclear how much change there has been since last seen by pulmonary some years ago.  Does seem that the frequency has worsened over the last couple of years.  Worse at night but occurs throughout the day.  It is nonproductive.  Uses a variety of dextromethorphan containing products that do help, he rotates through these 2 or 3 products as they seem to wear off and affect.  He is currently on Breo does not seem to help.  Recently resumed gabapentin at low-dose, 100 mg twice daily with mild improvement.  Notes in the past he was on 200 mg twice daily of gabapentin with significant improvement however he had syncope in the setting of what sounds like polypharmacy.  Prior pulmonary notes indicate that he is cough improved on Dulera.  He identifies no other alleviating or exacerbating factors.  No environmental or seasonal factors that contribute to better or worse cough.  No position where things are better or worse.  He is on a PPI twice daily for a long history of GERD.  Does endorse occasional breakthrough symptoms of heartburn, reflux.  Reports endoscopy,  manometry, pH probe in the past although these results are all available for review and he does not recall the results.  Reviewed serial CT scans dating back to 2016, with persistent right lower lobe large AVM and small left lower lobe AVM stable from scan 04/2015, 09/2018, 11/2018, 11/2020 with otherwise clear lungs on my interpretation.  PMH: HHT with pulmonary AVMs, hypertension, GERD Surgical history: Brain surgery for brain abscess, Family history: Mother with diabetes, hypertension, father with kidney failure Social history: Lives in [place], retired, went to [employer], has 3 grandchildren  Public house manager / Pulmonary Flowsheets:   ACT:      No data to display           MMRC:     No data to display           Epworth:      No data to display           Tests:   FENO:  No results found for: "NITRICOXIDE"  PFT:    Latest Ref Rng & Units 07/31/2021   10:49 AM  PFT Results  FVC-Pre L 2.90   FVC-Predicted Pre % 72   FVC-Post L 2.81   FVC-Predicted Post % 69   Pre FEV1/FVC % % 81   Post FEV1/FCV % % 84   FEV1-Pre L 2.34   FEV1-Predicted Pre % 81   FEV1-Post L  2.38   DLCO uncorrected ml/min/mmHg 10.47   DLCO UNC% % 43   DLCO corrected ml/min/mmHg 10.47   DLCO COR %Predicted % 43   DLVA Predicted % 57   TLC L 5.07   TLC % Predicted % 72   RV % Predicted % 85   Personally reviewed and interpreted as normal spirometry, mildly reduced TLC/mild restriction, severely reduced DLCO.  WALK:     04/18/2023    2:16 PM 01/20/2023   11:45 AM 01/06/2023   11:53 AM  SIX MIN WALK  Supplimental Oxygen during Test? (L/min) No Yes Yes  O2 Flow Rate  3 L/min 5 L/min  Type  Continuous Continuous  Tech Comments: patient ambulated on RA at a moderate, steady pace with no stops, no complaints of sob. Patient O2 sats monitored on head probe during walk.  rest and lap #1 were on room air, provided 5L O2 and sats went up   Imaging: Personally reviewed and as per EMR  Lab  Results: Personally reviewed CBC    Component Value Date/Time   WBC 6.5 03/25/2023 0954   WBC 15.7 (H) 12/30/2022 0548   RBC 4.52 03/25/2023 0954   HGB 14.3 03/25/2023 0954   HCT 41.1 03/25/2023 0954   PLT 287 03/25/2023 0954   MCV 90.9 03/25/2023 0954   MCH 31.6 03/25/2023 0954   MCHC 34.8 03/25/2023 0954   RDW 13.5 03/25/2023 0954   LYMPHSABS 2.2 03/25/2023 0954   MONOABS 0.6 03/25/2023 0954   EOSABS 0.1 03/25/2023 0954   BASOSABS 0.0 03/25/2023 0954    BMET    Component Value Date/Time   NA 128 (L) 01/04/2023 0605   NA 133 (L) 10/28/2022 1400   K 4.7 01/04/2023 0605   CL 91 (L) 01/04/2023 0605   CO2 29 01/04/2023 0605   GLUCOSE 86 01/04/2023 0605   BUN 17 01/04/2023 0605   BUN 13 10/28/2022 1400   CREATININE 0.85 01/04/2023 0605   CALCIUM 8.8 (L) 01/04/2023 0605   GFRNONAA >60 01/04/2023 0605   GFRAA >60 06/01/2020 1106    BNP    Component Value Date/Time   BNP 37.1 12/30/2022 0548    ProBNP    Component Value Date/Time   PROBNP 126.0 (H) 03/19/2021 0959    Specialty Problems       Pulmonary Problems   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   Seasonal allergic rhinitis due to pollen   Laryngopharyngeal reflux (LPR)   Interstitial lung disease (HCC)   On home O2    Allergies  Allergen Reactions   Nsaids Other (See Comments)    Cannot take-per MD   Other Other (See Comments)    All blood thinners-cannot take per MD   Aspirin Other (See Comments)    nosebleeds   Statins Other (See Comments)    Weakness, myalgias   Demeclocycline Other (See Comments)    Passed out   Dust Mite Extract Other (See Comments)   Tizanidine Hcl Other (See Comments)    Patient stated that after taking this medication he experienced feelings of dizziness and disorientation.     Immunization History  Administered Date(s) Administered   COVID-19, mRNA, vaccine(Comirnaty)12 years and older 10/06/2022   Fluad Quad(high Dose 65+) 08/24/2019,  10/02/2020, 09/11/2021   Influenza Split 09/21/2011, 09/27/2012   Influenza Whole 09/11/2008, 09/11/2009, 09/22/2010   Influenza, High Dose Seasonal PF 09/09/2016, 09/16/2017, 10/03/2018   Influenza, Quadrivalent, Recombinant, Inj, Pf 08/20/2022   Influenza,inj,Quad PF,6+ Mos 10/04/2013, 10/08/2014, 10/02/2015   PFIZER  Comirnaty(Gray Top)Covid-19 Tri-Sucrose Vaccine 06/08/2021   PFIZER(Purple Top)SARS-COV-2 Vaccination 01/03/2020, 01/23/2020, 10/18/2020   PNEUMOCOCCAL CONJUGATE-20 04/26/2022   Pfizer Covid-19 Vaccine Bivalent Booster 56yrs & up 10/21/2021   Pneumococcal Conjugate-13 12/07/2016   Pneumococcal Polysaccharide-23 03/12/2010, 06/27/2018   Tdap 10/04/2013   Varicella 12/20/2009   Zoster Recombinat (Shingrix) 06/24/2022, 10/25/2022   Zoster, Live 09/06/2022    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   GERD (gastroesophageal reflux disease)    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  WITHOUT HEMOPTYSIS   History of nonmelanoma skin cancer EXCISION SQUAMOUS CELL FROM HAND   Hypertrophy of prostate with urinary obstruction and other lower urinary tract symptoms (LUTS)    ILD (interstitial lung disease) (HCC)    Irritable bowel syndrome    Nocturnal leg cramps 06/11/2013   Plantar fascial fibromatosis    Pure hypercholesterolemia    RSV (respiratory syncytial virus pneumonia) 01/07/2023   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   albuterol (PROVENTIL) (2.5 MG/3ML) 0.083% nebulizer solution INHALE 3 MLS BY NEBULIZATION EVERY 6 HOURS AS NEEDED FOR WHEEZING * DRUG NOT COVERED   albuterol (VENTOLIN HFA) 108 (90 Base) MCG/ACT inhaler Inhale 2 puffs into the lungs every 6 (six) hours as needed for wheezing or shortness of breath.   Ascorbic Acid (VITAMIN C) 1000 MG tablet Take 500 mg by mouth daily.   Budeson-Glycopyrrol-Formoterol (BREZTRI AEROSPHERE) 160-9-4.8 MCG/ACT AERO Inhale 2 puffs into the lungs in the morning and at bedtime.   Cholecalciferol 125 MCG (5000 UT) capsule Take 5,000 Units by mouth daily.   docusate sodium (COLACE) 100 MG capsule Take 200 mg by mouth at bedtime.   fluorometholone (FML) 0.1 % ophthalmic suspension Place 1 drop into both eyes 2 (two) times daily.   gabapentin (NEURONTIN) 100 MG capsule Take 1 capsule (100 mg total) by mouth 2 (two) times daily.   Iron, Ferrous Sulfate, 325 (65 Fe) MG TABS Take 325 mg by mouth in the morning, at noon, and at bedtime.   levocetirizine (XYZAL) 5 MG tablet Take 1 tablet (5 mg total) by mouth every evening.   levothyroxine (SYNTHROID) 150 MCG tablet Take 1 tablet (150 mcg total) by mouth daily before breakfast.   magic mouthwash (nystatin, hydrocortisone, diphenhydrAMINE) suspension Swish and swallow 5 mLs 3 (three) times daily as needed (mouth and throat irritation).   Magnesium 250 MG TABS Take 250 mg by mouth at bedtime.    meclizine (ANTIVERT) 25 MG tablet Take 1 tablet (25 mg total) by mouth 2 (two) times daily as needed for dizziness.   methocarbamol (ROBAXIN)  500 MG tablet Take 1 tablet  (500 mg total) by mouth every 8 (eight) hours as needed for muscle spasms.   montelukast (SINGULAIR) 10 MG tablet TAKE 1 TABLET BY MOUTH AT  BEDTIME (Patient taking differently: Take 10 mg by mouth at bedtime.)   Multiple Vitamin (MULTIVITAMIN WITH MINERALS) TABS tablet Take 1 tablet by mouth every morning.   mupirocin ointment (BACTROBAN) 2 % Apply 1 Application topically 2 (two) times daily. To affected area   pantoprazole (PROTONIX) 40 MG tablet TAKE 1 TABLET BY MOUTH TWICE  DAILY (Patient taking differently: Take 40 mg by mouth daily.)   silodosin (RAPAFLO) 8 MG CAPS capsule Take 1 capsule (8 mg total) by mouth at bedtime.   sodium chloride 1 g tablet Take 1 tablet (1 g total) by mouth 2 (two) times daily with a meal.   valsartan (DIOVAN) 40 MG tablet Take 1 tablet (40 mg total) by mouth daily.   [DISCONTINUED] fluorouracil (EFUDEX) 5 % cream daily   No facility-administered encounter medications on file as of 04/18/2023.     Review of Systems  Review of Systems  N/a Physical Exam  BP (!) 144/62 (BP Location: Left Arm, Patient Position: Sitting, Cuff Size: Normal)   Pulse 82   Temp 97.7 F (36.5 C) (Oral)   Ht 5\' 9"  (1.753 m)   Wt 187 lb (84.8 kg)   SpO2 94% Comment: at rest on RA  BMI 27.62 kg/m   Wt Readings from Last 5 Encounters:  04/18/23 187 lb (84.8 kg)  03/25/23 185 lb 4.8 oz (84.1 kg)  02/22/23 175 lb (79.4 kg)  02/17/23 183 lb 6.8 oz (83.2 kg)  01/24/23 181 lb 3.2 oz (82.2 kg)    BMI Readings from Last 5 Encounters:  04/18/23 27.62 kg/m  03/25/23 27.36 kg/m  02/22/23 25.84 kg/m  02/17/23 27.89 kg/m  01/24/23 27.55 kg/m     Physical Exam General: Well-appearing, in no acute distress Eyes: EOMI, no icterus Neck: Supple no JVP Cardiovascular: Regular in rhythm, no murmurs Pulmonary: Clear to auscultation bilaterally, no wheezing Abdomen: Nondistended, bowel sounds present MSK: No synovitis, no joint effusion Neuro: Normal gait, no weakness Psych:  Normal mood, full affect   Assessment & Plan:   Chronic Cough: Likely multifactorial. Improved with Dulera in past although more recently Breo did not help. Begs question of cough variant asthma.  Did not improve with high-dose Dulera or low-dose gabapentin.  Has improved recently with prolonged steroid therapy.  DOE: Likely multifactorial.  Worsening in the summer 2022 related to anemia, hemoglobin found to be in 6s.  Likely chronic GI losses in the setting of his AVM.  On iron supplementation and improving.  Likely some contribution from mild but stable signs of interstitial lung disease on CT chest.  Worsened after RSV pneumonia, now improving again.  Hypoxemic respiratory failure: Multifactorial mostly driven by recent RSV infection.  Ambulate around clinic today and did not desaturate.  Overnight oximetry ordered to assess for nocturnal desaturations, to be performed on room air, discussed with patient and placed on order.  Pulmonary AVMs: Repeat CT January 2024 showed stable AVMs.  ILD: Mild on imaging.  Repeat high-res CT, new order today.   Return in about 6 months (around 10/18/2023).   Karren Burly, MD 04/18/2023  I spent 41 minutes in care of this patient including face-to-face visit, review of records, coordination of care.

## 2023-04-19 ENCOUNTER — Other Ambulatory Visit (HOSPITAL_BASED_OUTPATIENT_CLINIC_OR_DEPARTMENT_OTHER): Payer: Self-pay

## 2023-04-19 MED ORDER — AREXVY 120 MCG/0.5ML IM SUSR
0.5000 mL | INTRAMUSCULAR | 0 refills | Status: DC
  Filled 2023-04-19: qty 0.5, 1d supply, fill #0

## 2023-04-20 ENCOUNTER — Other Ambulatory Visit: Payer: Self-pay

## 2023-04-20 ENCOUNTER — Emergency Department (HOSPITAL_COMMUNITY): Payer: Medicare Other

## 2023-04-20 ENCOUNTER — Emergency Department (HOSPITAL_COMMUNITY)
Admission: EM | Admit: 2023-04-20 | Discharge: 2023-04-20 | Disposition: A | Discharge status: Home / Self Care | Payer: Medicare Other | Attending: Emergency Medicine | Admitting: Emergency Medicine

## 2023-04-20 DIAGNOSIS — Z85828 Personal history of other malignant neoplasm of skin: Secondary | ICD-10-CM | POA: Diagnosis not present

## 2023-04-20 DIAGNOSIS — R55 Syncope and collapse: Secondary | ICD-10-CM | POA: Diagnosis present

## 2023-04-20 LAB — CBC
HCT: 38.6 % — ABNORMAL LOW (ref 39.0–52.0)
Hemoglobin: 13.6 g/dL (ref 13.0–17.0)
MCH: 31.4 pg (ref 26.0–34.0)
MCHC: 35.2 g/dL (ref 30.0–36.0)
MCV: 89.1 fL (ref 80.0–100.0)
Platelets: 269 10*3/uL (ref 150–400)
RBC: 4.33 MIL/uL (ref 4.22–5.81)
RDW: 12.8 % (ref 11.5–15.5)
WBC: 11.9 10*3/uL — ABNORMAL HIGH (ref 4.0–10.5)
nRBC: 0 % (ref 0.0–0.2)

## 2023-04-20 LAB — BASIC METABOLIC PANEL
Anion gap: 12 (ref 5–15)
BUN: 16 mg/dL (ref 8–23)
CO2: 22 mmol/L (ref 22–32)
Calcium: 8.6 mg/dL — ABNORMAL LOW (ref 8.9–10.3)
Chloride: 89 mmol/L — ABNORMAL LOW (ref 98–111)
Creatinine, Ser: 0.98 mg/dL (ref 0.61–1.24)
GFR, Estimated: >60 mL/min (ref >60)
Glucose, Bld: 118 mg/dL — ABNORMAL HIGH (ref 70–99)
Potassium: 4.1 mmol/L (ref 3.5–5.1)
Sodium: 123 mmol/L — ABNORMAL LOW (ref 135–145)

## 2023-04-20 LAB — TROPONIN I (HIGH SENSITIVITY)
Troponin I (High Sensitivity): 7 ng/L (ref <18)
Troponin I (High Sensitivity): 7 ng/L (ref <18)

## 2023-04-20 LAB — D-DIMER, QUANTITATIVE: D-Dimer, Quant: 0.47 ug/mL-FEU (ref 0.00–0.50)

## 2023-04-20 NOTE — ED Provider Notes (Signed)
[facility] Emergency Department Provider Note MRN:  [id number]  Arrival date & time: 04/20/23     Chief Complaint   Syncope   History of Present Illness   Gregory [name] is a 82 y.o. year-old male with no pertinent past medical history presenting to the ED with chief complaint of syncope.  Patient thinks that he passed out in the waiting room.  With sitting with his wife who is waiting to be seen in the emergency department.  He felt generally warm and then possibly passed out.  He thought maybe he just fell asleep but his wife thinks that he passed out.  Never had any chest pain or shortness of breath, no abdominal pain, no numbness or weakness to the arms or legs.  Review of Systems  A thorough review of systems was obtained and all systems are negative except as noted in the HPI and PMH.   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   GERD (gastroesophageal reflux disease)    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  WITHOUT HEMOPTYSIS   History of nonmelanoma skin cancer EXCISION SQUAMOUS CELL FROM HAND   Hypertrophy of prostate with urinary obstruction and other lower urinary tract symptoms (LUTS)    ILD (interstitial lung disease) (HCC)    Irritable bowel syndrome    Nocturnal leg cramps 06/11/2013   Plantar fascial fibromatosis    Pure hypercholesterolemia    RSV (respiratory syncytial virus pneumonia) 01/07/2023   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    Symptomatic anemia 08/22/2021   Telangiectasia, hereditary hemorrhagic, of Rendu, 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)   SKIN, LIPS, NASAL  W/ PREVIOUS NOSE BLEEDS  AMD GI TELANGIECTASIA   Unspecified hypothyroidism     Past Surgical History:  Procedure Laterality Date   APPLICATION OF CRANIAL NAVIGATION N/A 05/16/2015   Procedure: APPLICATION OF CRANIAL NAVIGATION;  Surgeon: Lisbeth Renshaw, MD;  Location: [facility];  Service: Neurosurgery;  Laterality: N/A;   BRAIN BIOPSY Left 05/16/2015   Procedure: Stereotactic Left Brain Biopsy with Brain Lab;  Surgeon: Lisbeth Renshaw, MD;  Location: [facility];  Service: Neurosurgery;  Laterality: Left;  Stereotactic Left Brain biopsy with brainlab   BRAIN SURGERY     CARDIOVASCULAR STRESS TEST  01/14/2003   NO ISCHEMIA / EF 61%/ NORMAL LE WALL MOTION   EXCISION LEFT WRIST GANGLIAN/ MYXOID CYST  05/30/2009   IR RADIOLOGIST EVAL & MGMT  02/01/2019   NASAL SINUS SURGERY     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 Sharyn Lull   TRANSTHORACIC ECHOCARDIOGRAM  10-17-2008   DR CRENSHAW   NORMAL LVF/ EF 60%/  MILDLY DILATED RIGHT ATRIUM/ VENTRICULE   VARICOCELECTOMY  1991   Dr. Gaynelle Arabian    Family History  Problem Relation Age of Onset   Diabetes Mother    Hypertension Mother    Pancreatic cancer Mother  Stroke Mother    Kidney failure Father    Hypertension Sister    Healthy Son     Social History   Socioeconomic History   Marital status: Married    Spouse name: Dewayne Hatch   Number of children: 2   Years of education: Not on file   Highest education level: Master's degree (e.g., MA, MS, MEng, MEd, MSW, MBA)  Occupational History    Comment: Consultant   Occupation: retired  Tobacco Use   Smoking status: Never   Smokeless tobacco: Never  Vaping Use   Vaping Use: Never used  Substance and Sexual Activity   Alcohol use: No    Alcohol/week: 0.0 standard drinks of alcohol   Drug use: No    Sexual activity: Not on file  Other Topics Concern   Not on file  Social History Narrative   Lives with wife in a one story home.  Has 2 sons.  Retired Equities trader.  Education: Masters degree.      05/09/19- Pt states that his balance is worse since last visit. He has not fallen but has come close on a few occassions. The weakness on his right side is the same, he states.      Right Handed   Drinks caffeine    Social Determinants of Health   Financial Resource Strain: Low Risk  (02/22/2023)   Overall Financial Resource Strain (CARDIA)    Difficulty of Paying Living Expenses: Not hard at all  Food Insecurity: No Food Insecurity (02/22/2023)   Hunger Vital Sign    Worried About Running Out of Food in the Last Year: Never true    Ran Out of Food in the Last Year: Never true  Transportation Needs: No Transportation Needs (02/22/2023)   PRAPARE - Administrator, Civil Service (Medical): No    Lack of Transportation (Non-Medical): No  Physical Activity: Insufficiently Active (02/22/2023)   Exercise Vital Sign    Days of Exercise per Week: 3 days    Minutes of Exercise per Session: 30 min  Stress: No Stress Concern Present (02/22/2023)   [employer] of Occupational Health - Occupational Stress Questionnaire    Feeling of Stress : Not at all  Social Connections: Not on file  Intimate Partner Violence: Not on file     Physical Exam   Vitals:   04/20/23 0545 04/20/23 0615  BP: (!) 142/64 (!) 149/66  Pulse: 64 63  Resp: (!) 21 17  Temp:    SpO2: 100% 100%    CONSTITUTIONAL: Well-appearing, NAD NEURO/PSYCH:  Alert and oriented x 3, no focal deficits EYES:  eyes equal and reactive ENT/NECK:  no LAD, no JVD CARDIO: Regular rate, well-perfused, normal S1 and S2 PULM:  CTAB no wheezing or rhonchi GI/GU:  non-distended, non-tender MSK/SPINE:  No gross deformities, no edema SKIN:  no rash, atraumatic   *Additional and/or pertinent findings included in MDM  below  Diagnostic and Interventional Summary    EKG Interpretation  Date/Time:  Wednesday Apr 20 2023 02:45:57 EDT Ventricular Rate:  55 PR Interval:  183 QRS Duration: 106 QT Interval:  466 QTC Calculation: 446 R Axis:   8 Text Interpretation: Sinus rhythm Low voltage, precordial leads Confirmed by Kennis Carina [phone]) on 04/20/2023 3:30:21 AM       Labs Reviewed  CBC - Abnormal; Notable for the following components:      Result Value   WBC 11.9 (*)    HCT 38.6 (*)    All other  components within normal limits  BASIC METABOLIC PANEL - Abnormal; Notable for the following components:   Sodium 123 (*)    Chloride 89 (*)    Glucose, Bld 118 (*)    Calcium 8.6 (*)    All other components within normal limits  D-DIMER, QUANTITATIVE  TROPONIN I (HIGH SENSITIVITY)  TROPONIN I (HIGH SENSITIVITY)    DG Chest Port 1 View  Final Result      Medications - No data to display   Procedures  /  Critical Care Procedures  ED Course and Medical Decision Making  Initial Impression and Ddx Question syncopal event versus falling asleep in the waiting room.  No complaints at this time with reassuring vital signs, no evidence of DVT on exam, clear lungs, asymptomatic.  Arrhythmia is a consideration, on cardiac monitor awaiting the laboratory assessment.  Past medical/surgical history that increases complexity of ED encounter: Chronic respiratory failure related to recent RSV  Interpretation of Diagnostics I personally reviewed the EKG and my interpretation is as follows: Sinus rhythm  Labs reassuring with no significant blood count or electrolyte disturbance, D-dimer negative, troponin negative x 2.  Patient Reassessment and Ultimate Disposition/Management     Patient continues to be asymptomatic, reassuring workup, appropriate for discharge.  Patient management required discussion with the following services or consulting groups:  None  Complexity of Problems Addressed Acute  illness or injury that poses threat of life of bodily function  Additional Data Reviewed and Analyzed Further history obtained from: Further history from spouse/family member  Additional Factors Impacting ED Encounter Risk None  Elmer Sow. Pilar Plate, MD [facility] Emergency Medicine [facility] [email or web address]  Final Clinical Impressions(s) / ED Diagnoses     ICD-10-CM   1. Syncope, unspecified syncope type  R55       ED Discharge Orders     None        Discharge Instructions Discussed with and Provided to Patient:     Discharge Instructions      You were evaluated in the Emergency Department and after careful evaluation, we did not find any emergent condition requiring admission or further testing in the hospital.  Your exam/testing today was overall reassuring.  Please return to the Emergency Department if you experience any worsening of your condition.  Thank you for allowing [place] to be a part of your care.        Sabas Sous, MD 04/20/23 [phone]

## 2023-04-20 NOTE — ED Triage Notes (Signed)
Patient found unresponsive by his wife who is a patient waiting to be seen while at the waiting area at ER this morning , pale and diaphoretic .

## 2023-04-20 NOTE — Discharge Instructions (Signed)
You were evaluated in the Emergency Department and after careful evaluation, we did not find any emergent condition requiring admission or further testing in the hospital.  Your exam/testing today was overall reassuring.  Please return to the Emergency Department if you experience any worsening of your condition.  Thank you for allowing us to be a part of your care.  

## 2023-04-21 ENCOUNTER — Telehealth: Payer: Self-pay | Admitting: Neurology

## 2023-04-21 NOTE — Telephone Encounter (Signed)
F/u  On May 04/20/23 patient was in the hospital with his wife patient wife went out of the room due to testing once his wife came back into the room his wife stated he was hot and sweaty he had passed out.    The patient was admitted to the hospital to be checked out and was told to follow up with his PCP  The patient wanted to make sure he did not have TIA.

## 2023-04-21 NOTE — Telephone Encounter (Signed)
Pt called in stating he had to go to the ED yesterday due to passing out. He says this has happened to him about 3 times in the last 2 years. He would like to see what Dr. Allena Katz thinks he should do?

## 2023-04-21 NOTE — Telephone Encounter (Signed)
Called patient and gave Dr. Loleta Chance recommendations

## 2023-04-21 NOTE — Telephone Encounter (Signed)
Called patient and left voicemail that I needed some more information regarding his passing out

## 2023-04-26 ENCOUNTER — Ambulatory Visit (INDEPENDENT_AMBULATORY_CARE_PROVIDER_SITE_OTHER): Payer: Medicare Other | Admitting: Nurse Practitioner

## 2023-04-26 ENCOUNTER — Other Ambulatory Visit: Payer: Self-pay

## 2023-04-26 ENCOUNTER — Encounter: Payer: Self-pay | Admitting: Nurse Practitioner

## 2023-04-26 VITALS — BP 114/80 | HR 59 | Wt 183.4 lb

## 2023-04-26 DIAGNOSIS — R55 Syncope and collapse: Secondary | ICD-10-CM | POA: Diagnosis not present

## 2023-04-26 MED ORDER — PIP PEN NEEDLES 32G X 4MM 32G X 4 MM MISC: 1.0000 | Freq: Every day | 1 refills | Status: DC

## 2023-04-26 NOTE — Progress Notes (Signed)
Tollie Eth, DNP, AGNP-c [facility] [address] [place], [place] [address] [phone]  Subjective:   Gregory [name] is a 82 y.o. male presents to day for evaluation of: Syncopal Episode  Gregory [name] tells me he was in the emergency room with his wife who was being seen as a patient. While sitting and waiting, he tells me he suddenly felt hot then cold and clammy. At that point he felt he may have fallen asleep, but his wife reports he told her he was hot and thirsty and then his head slumped down and he broke out in a sweat. She was unable to get a response from him other than trying to lift his head a bit but he was not speaking and not making eye contact. He cannot remember any of this.   He had eaten several hours (5-is) before, chinese food, and does not recall feeling chest pain, shortness of breath, or weakness.   He has a history of similar events happening with hypoglycemia and they question whether this could have been a cause. This was the third episode in about 2 years. He also questions whether this could have been a TIA.   In the ED his blood pressures were slightly elevated. All labs were WNL. Cardiac monitoring was unrevealing. No imaging was done due to normal findings otherwise and the patient was at baseline.   The only new event was receiving his RSV vaccine [name] that morning.   PMH, Medications, and Allergies reviewed and updated in chart as appropriate.   ROS negative except for what is listed in HPI. Objective:  BP 114/80   Pulse (!) 59   Wt 183 lb 6.4 oz (83.2 kg)   SpO2 96%   BMI 27.08 kg/m  Physical Exam Vitals and nursing note reviewed.  Constitutional:      Appearance: Normal appearance.  HENT:     Head: Normocephalic.  Eyes:     Pupils: Pupils are equal, round, and reactive to light.  Neck:     Vascular: No carotid bruit.  Cardiovascular:     Rate and Rhythm: Normal rate and regular rhythm.     Pulses: Normal pulses.      Heart sounds: Murmur heard.  Pulmonary:     Effort: Pulmonary effort is normal.     Breath sounds: Normal breath sounds.  Abdominal:     General: Bowel sounds are normal.     Palpations: Abdomen is soft.  Musculoskeletal:        General: Normal range of motion.     Cervical back: Normal range of motion.     Right lower leg: No edema.     Left lower leg: No edema.  Skin:    General: Skin is warm.     Capillary Refill: Capillary refill takes less than 2 seconds.  Neurological:     General: No focal deficit present.     Mental Status: He is alert and oriented to person, place, and time.     Motor: No weakness.     Coordination: Coordination normal.  Psychiatric:        Mood and Affect: Mood normal.           Assessment & Plan:   Problem List Items Addressed This Visit     Syncope - Primary    Recent syncopal episode while in the ED with his wife, who was the patient. Etiology unknown at this time, although, workup was normal. Unable to rule out cardiac  arrhythmia being a cause, however, following the event, his rhythm was regular. No alarm symptoms are present at this time. Given his history of hypoglycemia with collapse and similar symptoms with the recent presentation, there is concern that the symptoms are related to hypoglycemia.  Plan: - CGM placed today for blood sugar monitoring. Will link this to the office to monitor blood sugars for the next 2 weeks to evaluate for possible hypoglycemic episodes.  - If event similar happens again, seek emergency evaluation immediately - Keep small snacks on you at all times and eat at least every 4 hours.          Tollie Eth, DNP, AGNP-c 05/12/2023  7:10 PM    History, Medications, Surgery, SDOH, and Family History reviewed and updated as appropriate.

## 2023-04-28 ENCOUNTER — Telehealth: Payer: Self-pay | Admitting: Pulmonary Disease

## 2023-04-28 ENCOUNTER — Other Ambulatory Visit: Payer: Self-pay | Admitting: *Deleted

## 2023-04-28 NOTE — Telephone Encounter (Signed)
Pt called the office stating that he was still waiting to hear from DME about having the ONO performed. I see where order was placed after last office visit for this to be done.  Routing to [facility] for review on this. Please advise.

## 2023-04-28 NOTE — Progress Notes (Signed)
error 

## 2023-05-02 NOTE — Telephone Encounter (Signed)
Called [employer] they have the order and I called patient to let them know he has the order

## 2023-05-06 ENCOUNTER — Other Ambulatory Visit (HOSPITAL_BASED_OUTPATIENT_CLINIC_OR_DEPARTMENT_OTHER): Payer: Medicare Other

## 2023-05-09 ENCOUNTER — Ambulatory Visit (HOSPITAL_BASED_OUTPATIENT_CLINIC_OR_DEPARTMENT_OTHER)
Admission: RE | Admit: 2023-05-09 | Discharge: 2023-05-09 | Disposition: A | Discharge status: Home / Self Care | Payer: Medicare Other | Source: Ambulatory Visit | Attending: Pulmonary Disease | Admitting: Pulmonary Disease

## 2023-05-09 DIAGNOSIS — J849 Interstitial pulmonary disease, unspecified: Secondary | ICD-10-CM | POA: Diagnosis present

## 2023-05-10 ENCOUNTER — Other Ambulatory Visit: Payer: Self-pay

## 2023-05-10 ENCOUNTER — Telehealth: Payer: Self-pay

## 2023-05-10 MED ORDER — FREESTYLE LIBRE 3 SENSOR MISC: 2.0000 | 3 refills | Status: DC

## 2023-05-10 NOTE — Telephone Encounter (Signed)
Pt. Called stating his freestyle Gregory [name] is done he has been wearing it for 14 days. He wanted to know if you wanted him to keep using it. I told him usually we do and I can send it in to his pharmacy but he wanted me to check with you first before getting a ne wone put on.

## 2023-05-12 ENCOUNTER — Encounter: Payer: Self-pay | Admitting: Nurse Practitioner

## 2023-05-12 DIAGNOSIS — R55 Syncope and collapse: Secondary | ICD-10-CM | POA: Insufficient documentation

## 2023-05-12 HISTORY — DX: Syncope and collapse: R55

## 2023-05-12 NOTE — Assessment & Plan Note (Signed)
Recent syncopal episode while in the ED with Gregory [name] wife, who was the patient. Etiology unknown at this time, although, workup was normal. Unable to rule out cardiac arrhythmia being a cause, however, following the event, Gregory [name] rhythm was regular. No alarm symptoms are present at this time. Given Gregory [name] history of hypoglycemia with collapse and similar symptoms with the recent presentation, there is concern that the symptoms are related to hypoglycemia.  Plan: - CGM placed today for blood sugar monitoring. Will link this to the office to monitor blood sugars for the next 2 weeks to evaluate for possible hypoglycemic episodes.  - If event similar happens again, seek emergency evaluation immediately - Keep small snacks on you at all times and eat at least every 4 hours.

## 2023-05-28 ENCOUNTER — Other Ambulatory Visit (HOSPITAL_BASED_OUTPATIENT_CLINIC_OR_DEPARTMENT_OTHER): Payer: Self-pay | Admitting: Nurse Practitioner

## 2023-05-28 DIAGNOSIS — K219 Gastro-esophageal reflux disease without esophagitis: Secondary | ICD-10-CM

## 2023-05-28 DIAGNOSIS — J4530 Mild persistent asthma, uncomplicated: Secondary | ICD-10-CM

## 2023-05-30 ENCOUNTER — Telehealth: Payer: Self-pay | Admitting: Pulmonary Disease

## 2023-05-30 DIAGNOSIS — G4734 Idiopathic sleep related nonobstructive alveolar hypoventilation: Secondary | ICD-10-CM

## 2023-05-30 NOTE — Telephone Encounter (Addendum)
Pt called in bc he wants to inform Dr. Judeth Horn that he has completed a test that shows him not being on oxygen overnight. Pt also wants his CT results

## 2023-06-01 NOTE — Telephone Encounter (Signed)
Pt states he told Verlon Au to cancel his O2 order. Now saying to hold off. Wants to speak with Dr. Tiajuana Amass.

## 2023-06-01 NOTE — Telephone Encounter (Signed)
Dr Judeth Horn has reviewed his ONO on RA and states "no need for o2, sats <88% for < 5 min"  Pt notified of results  I have sent order to d/c o2- [employer]    He is asking for the results of the HRCT done 05/09/23   Please advise, thanks!

## 2023-06-02 NOTE — Telephone Encounter (Signed)
Called the pt and there was no answer- LMTCB    

## 2023-06-02 NOTE — Telephone Encounter (Signed)
PT ret Leslie's call. Please try again.

## 2023-06-03 NOTE — Telephone Encounter (Signed)
Called and spoke with patient. He confirmed that he wants to hold off on cancelling the O2 from [employer]. I advised him that since the order had already been placed, I'd call [employer] after they open at 9am to cancel this. He verbalized understanding.   He stated that he received his CT scan results earlier so he is aware of the results.   Nothing further needed at time of call.

## 2023-06-09 ENCOUNTER — Encounter: Payer: Self-pay | Admitting: Pulmonary Disease

## 2023-06-27 ENCOUNTER — Telehealth: Payer: Self-pay | Admitting: Pulmonary Disease

## 2023-06-27 DIAGNOSIS — Z9981 Dependence on supplemental oxygen: Secondary | ICD-10-CM

## 2023-06-27 NOTE — Telephone Encounter (Signed)
Please see last signed encounter. PT now wants to give his OK for [place] to advise [employer] to come and get his O2. Thanks.

## 2023-06-28 ENCOUNTER — Inpatient Hospital Stay: Payer: Medicare Other | Attending: Nurse Practitioner

## 2023-06-28 DIAGNOSIS — D500 Iron deficiency anemia secondary to blood loss (chronic): Secondary | ICD-10-CM | POA: Diagnosis not present

## 2023-06-28 DIAGNOSIS — I780 Hereditary hemorrhagic telangiectasia: Secondary | ICD-10-CM | POA: Insufficient documentation

## 2023-06-28 LAB — CBC WITH DIFFERENTIAL (CANCER CENTER ONLY)
Abs Immature Granulocytes: 0.03 10*3/uL (ref 0.00–0.07)
Basophils Absolute: 0 10*3/uL (ref 0.0–0.1)
Basophils Relative: 1 %
Eosinophils Absolute: 0.1 10*3/uL (ref 0.0–0.5)
Eosinophils Relative: 1 %
HCT: 41.9 % (ref 39.0–52.0)
Hemoglobin: 14.8 g/dL (ref 13.0–17.0)
Immature Granulocytes: 0 %
Lymphocytes Relative: 33 %
Lymphs Abs: 2.4 10*3/uL (ref 0.7–4.0)
MCH: 31.4 pg (ref 26.0–34.0)
MCHC: 35.3 g/dL (ref 30.0–36.0)
MCV: 88.8 fL (ref 80.0–100.0)
Monocytes Absolute: 0.9 10*3/uL (ref 0.1–1.0)
Monocytes Relative: 12 %
Neutro Abs: 3.9 10*3/uL (ref 1.7–7.7)
Neutrophils Relative %: 53 %
Platelet Count: 287 10*3/uL (ref 150–400)
RBC: 4.72 MIL/uL (ref 4.22–5.81)
RDW: 13.1 % (ref 11.5–15.5)
WBC Count: 7.4 10*3/uL (ref 4.0–10.5)
nRBC: 0 % (ref 0.0–0.2)

## 2023-06-28 LAB — FERRITIN: Ferritin: 32 ng/mL (ref 24–336)

## 2023-06-28 NOTE — Telephone Encounter (Signed)
Order has been placed. NFN

## 2023-06-29 ENCOUNTER — Other Ambulatory Visit: Payer: Self-pay

## 2023-06-29 ENCOUNTER — Telehealth: Payer: Self-pay

## 2023-06-29 DIAGNOSIS — D500 Iron deficiency anemia secondary to blood loss (chronic): Secondary | ICD-10-CM

## 2023-06-29 NOTE — Telephone Encounter (Signed)
Patient gave verbal understanding and had no further questions or concerns  

## 2023-06-29 NOTE — Telephone Encounter (Signed)
-----   Message from Ladene Artist, MD sent at 06/28/2023  5:24 PM EDT ----- Please call patient, the levels in the low normal range, hemoglobin remains normal will continue iron, repeat CBC and ferritin in 6 weeks

## 2023-07-04 ENCOUNTER — Other Ambulatory Visit: Payer: Self-pay

## 2023-07-04 DIAGNOSIS — N401 Enlarged prostate with lower urinary tract symptoms: Secondary | ICD-10-CM

## 2023-07-04 MED ORDER — SILODOSIN 8 MG PO CAPS
8.0000 mg | ORAL_CAPSULE | Freq: Every day | ORAL | 1 refills | Status: DC
Start: 2023-07-04 — End: 2023-12-19

## 2023-07-07 ENCOUNTER — Ambulatory Visit: Payer: Medicare Other | Admitting: Pulmonary Disease

## 2023-07-07 ENCOUNTER — Encounter: Payer: Self-pay | Admitting: Pulmonary Disease

## 2023-07-07 VITALS — BP 124/72 | HR 61 | Ht 69.0 in | Wt 186.0 lb

## 2023-07-07 DIAGNOSIS — Z5181 Encounter for therapeutic drug level monitoring: Secondary | ICD-10-CM | POA: Diagnosis not present

## 2023-07-07 DIAGNOSIS — J849 Interstitial pulmonary disease, unspecified: Secondary | ICD-10-CM | POA: Diagnosis not present

## 2023-07-07 LAB — HEPATIC FUNCTION PANEL
ALT: 12 U/L (ref 0–53)
AST: 17 U/L (ref 0–37)
Albumin: 4.3 g/dL (ref 3.5–5.2)
Alkaline Phosphatase: 68 U/L (ref 39–117)
Bilirubin, Direct: 0.1 mg/dL (ref 0.0–0.3)
Total Bilirubin: 0.3 mg/dL (ref 0.2–1.2)
Total Protein: 6.8 g/dL (ref 6.0–8.3)

## 2023-07-07 MED ORDER — BREZTRI AEROSPHERE 160-9-4.8 MCG/ACT IN AERO: 2.0000 | INHALATION_SPRAY | Freq: Two times a day (BID) | RESPIRATORY_TRACT | 0 refills | Status: DC

## 2023-07-07 NOTE — Patient Instructions (Signed)
Nice to see you again  Keep on the oxygen saturation if getting worse please let me know  I am sorry but the Markus Daft being so expensive  I provided some samples today to have with you especially on your upcoming beach trip  We need to do blood work today for consideration of the new medication to try to slow down any scarring in the lung  The medication brand name is called Durel Salts  We will need to do paperwork today for Ofev  Return to clinic in 3 months or sooner as needed with Dr. Judeth Horn

## 2023-07-07 NOTE — Progress Notes (Signed)
@Patient  ID: Gregory [name], male    DOB: 1941/02/12, 82 y.o.   MRN: [id number]  Chief Complaint  Patient presents with   Follow-up    States his O2 has been running 88-92% on room air. Has seen it as low as 82% after exertion on room air. Cough has returned, described as a non-productive cough.     Referring provider: Tollie Eth, NP  HPI:   82 y.o. 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   Overall doing well.  No symptoms as oxygen drops down.  Recently discovered to the oxygen.  At rest does fine.  Today was in the low 90s after walking back.  Sometimes with heavier exertion drops in the mid 80s.  He does not want oxygen.  We discussed the risk and benefits after shared decision making decided not to order oxygen today.  Reviewed CT scan interim.  Overall stable.  Does have mild fibrotic changes with predilection for the periphery and posterior or dependent areas.  No frank honeycombing.  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  Discussed role and rationale for antifibrotic's but she is okay to move forward with.  HPI at initial visit: Patient has chronic cough over the last 2 years.  When asked in more detail he admits cough is been present for well over a decade.  Unclear how much change there has been since last seen by pulmonary some years ago.  Does seem that the frequency has worsened over the last couple of years.  Worse at night but occurs throughout the day.  It is nonproductive.  Uses a variety of dextromethorphan containing products that do help, he rotates through these 2 or 3 products as they seem to wear off and affect.  He is currently on Breo does not seem to help.  Recently resumed gabapentin at low-dose, 100 mg twice daily with mild improvement.  Notes in the past he was on 200 mg twice daily of gabapentin with significant improvement however he had syncope in the setting of what sounds like polypharmacy.  Prior pulmonary  notes indicate that he is cough improved on Dulera.  He identifies no other alleviating or exacerbating factors.  No environmental or seasonal factors that contribute to better or worse cough.  No position where things are better or worse.  He is on a PPI twice daily for a long history of GERD.  Does endorse occasional breakthrough symptoms of heartburn, reflux.  Reports endoscopy, manometry, pH probe in the past although these results are all available for review and he does not recall the results.  Reviewed serial CT scans dating back to 2016, with persistent right lower lobe large AVM and small left lower lobe AVM stable from scan 04/2015, 09/2018, 11/2018, 11/2020 with otherwise clear lungs on my interpretation.  PMH: HHT with pulmonary AVMs, hypertension, GERD Surgical history: Brain surgery for brain abscess, Family history: Mother with diabetes, hypertension, father with kidney failure Social history: Lives in [place], retired, went to [employer], has 3 grandchildren  Public house manager / Pulmonary Flowsheets:   ACT:      No data to display          MMRC:     No data to display          Epworth:      No data to display          Tests:   FENO:  No results found  for: "NITRICOXIDE"  PFT:    Latest Ref Rng & Units 07/31/2021   10:49 AM  PFT Results  FVC-Pre L 2.90   FVC-Predicted Pre % 72   FVC-Post L 2.81   FVC-Predicted Post % 69   Pre FEV1/FVC % % 81   Post FEV1/FCV % % 84   FEV1-Pre L 2.34   FEV1-Predicted Pre % 81   FEV1-Post L 2.38   DLCO uncorrected ml/min/mmHg 10.47   DLCO UNC% % 43   DLCO corrected ml/min/mmHg 10.47   DLCO COR %Predicted % 43   DLVA Predicted % 57   TLC L 5.07   TLC % Predicted % 72   RV % Predicted % 85   Personally reviewed and interpreted as normal spirometry, mildly reduced TLC/mild restriction, severely reduced DLCO.  WALK:     04/18/2023    2:16 PM 01/20/2023   11:45 AM 01/06/2023   11:53 AM  SIX MIN WALK  Supplimental  Oxygen during Test? (L/min) No Yes Yes  O2 Flow Rate  3 L/min 5 L/min  Type  Continuous Continuous  Tech Comments: patient ambulated on RA at a moderate, steady pace with no stops, no complaints of sob. Patient O2 sats monitored on head probe during walk.  rest and lap #1 were on room air, provided 5L O2 and sats went up   Imaging: Personally reviewed and as per EMR  Lab Results: Personally reviewed CBC    Component Value Date/Time   WBC 7.4 06/28/2023 1040   WBC 11.9 (H) 04/20/2023 0245   RBC 4.72 06/28/2023 1040   HGB 14.8 06/28/2023 1040   HCT 41.9 06/28/2023 1040   PLT 287 06/28/2023 1040   MCV 88.8 06/28/2023 1040   MCH 31.4 06/28/2023 1040   MCHC 35.3 06/28/2023 1040   RDW 13.1 06/28/2023 1040   LYMPHSABS 2.4 06/28/2023 1040   MONOABS 0.9 06/28/2023 1040   EOSABS 0.1 06/28/2023 1040   BASOSABS 0.0 06/28/2023 1040    BMET    Component Value Date/Time   NA 123 (L) 04/20/2023 0245   NA 133 (L) 10/28/2022 1400   K 4.1 04/20/2023 0245   CL 89 (L) 04/20/2023 0245   CO2 22 04/20/2023 0245   GLUCOSE 118 (H) 04/20/2023 0245   BUN 16 04/20/2023 0245   BUN 13 10/28/2022 1400   CREATININE 0.98 04/20/2023 0245   CALCIUM 8.6 (L) 04/20/2023 0245   GFRNONAA >60 04/20/2023 0245   GFRAA >60 06/01/2020 1106    BNP    Component Value Date/Time   BNP 37.1 12/30/2022 0548    ProBNP    Component Value Date/Time   PROBNP 126.0 (H) 03/19/2021 0959    Specialty Problems       Pulmonary Problems   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   Seasonal allergic rhinitis due to pollen   Laryngopharyngeal reflux (LPR)   Interstitial lung disease (HCC)   On home O2    Allergies  Allergen Reactions   Nsaids Other (See Comments)    Cannot take-per MD   Other Other (See Comments)    All blood thinners-cannot take per MD   Aspirin Other (See Comments)    nosebleeds   Statins Other (See Comments)    Weakness, myalgias   Demeclocycline Other (See  Comments)    Passed out   Dust Mite Extract Other (See Comments)   Tizanidine Hcl Other (See Comments)    Patient stated that after taking this medication he experienced feelings of dizziness and  disorientation.     Immunization History  Administered Date(s) Administered   COVID-19, mRNA, vaccine(Comirnaty)12 years and older 10/06/2022   Fluad Quad(high Dose 65+) 08/24/2019, 10/02/2020, 09/11/2021   Influenza Split 09/21/2011, 09/27/2012   Influenza Whole 09/11/2008, 09/11/2009, 09/22/2010   Influenza, High Dose Seasonal PF 09/09/2016, 09/16/2017, 10/03/2018   Influenza, Quadrivalent, Recombinant, Inj, Pf 08/20/2022   Influenza,inj,Quad PF,6+ Mos 10/04/2013, 10/08/2014, 10/02/2015   PFIZER Comirnaty(Gray Top)Covid-19 Tri-Sucrose Vaccine 06/08/2021   PFIZER(Purple Top)SARS-COV-2 Vaccination 01/03/2020, 01/23/2020, 10/18/2020   PNEUMOCOCCAL CONJUGATE-20 04/26/2022   Pfizer Covid-19 Vaccine Bivalent Booster 21yrs & up 10/21/2021   Pneumococcal Conjugate-13 12/07/2016   Pneumococcal Polysaccharide-23 03/12/2010, 06/27/2018   Respiratory Syncytial Virus Vaccine,Recomb Aduvanted(Arexvy) 04/19/2023   Tdap 10/04/2013   Varicella 12/20/2009   Zoster Recombinant(Shingrix) 06/24/2022, 10/25/2022   Zoster, Live 09/06/2022    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   GERD (gastroesophageal reflux disease)    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  WITHOUT HEMOPTYSIS   History of nonmelanoma skin cancer EXCISION SQUAMOUS CELL FROM HAND   Hypertrophy of prostate with urinary obstruction and other lower urinary tract symptoms (LUTS)    ILD (interstitial lung disease) (HCC)     Irritable bowel syndrome    Nocturnal leg cramps 06/11/2013   Pain in both lower extremities 10/28/2022   Plantar fascial fibromatosis    Pressure ulcer caused by device 01/07/2023   Pure hypercholesterolemia    RSV (respiratory syncytial virus pneumonia) 01/07/2023   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  Added automatically from request for surgery [id number]   Vaccine counseling 01/24/2023    Tobacco History: Social History   Tobacco Use  Smoking Status Never  Smokeless Tobacco Never   Counseling given: Not Answered   Continue to not smoke  Outpatient Encounter Medications as of 07/07/2023  Medication Sig   acetaminophen (TYLENOL) 325 MG tablet Take 2 tablets (650 mg total) by mouth every 6 (six) hours as needed for mild pain (or Fever >/= 101).   Ascorbic Acid (VITAMIN C) 1000 MG tablet Take 500 mg by mouth daily.   Budeson-Glycopyrrol-Formoterol (BREZTRI AEROSPHERE) 160-9-4.8 MCG/ACT AERO Inhale 2 puffs into the lungs in the morning and at bedtime.   Budeson-Glycopyrrol-Formoterol (BREZTRI AEROSPHERE) 160-9-4.8 MCG/ACT AERO Inhale 2 puffs into the lungs in the morning and at bedtime.   Cholecalciferol 125 MCG (5000 UT) capsule Take 5,000 Units by mouth daily.   docusate sodium (COLACE) 100 MG capsule Take 200 mg by mouth at bedtime.   fluorometholone (FML) 0.1 % ophthalmic suspension Place 1 drop into both eyes 2 (two) times daily.   gabapentin (NEURONTIN) 100 MG capsule Take 1 capsule (100 mg total) by mouth 2 (two) times daily.   Iron, Ferrous Sulfate, 325 (65 Fe) MG TABS Take 325 mg by mouth in the  morning, at noon, and at  bedtime.   levocetirizine (XYZAL) 5 MG tablet Take 1 tablet (5 mg total) by mouth every evening.   levothyroxine (SYNTHROID) 150 MCG tablet Take 1 tablet (150 mcg total) by mouth daily before breakfast.   magic mouthwash (nystatin, hydrocortisone, diphenhydrAMINE) suspension Swish and swallow 5 mLs 3 (three) times daily as needed (mouth and throat irritation).   Magnesium 250 MG TABS Take 250 mg by mouth at bedtime.    meclizine (ANTIVERT) 25 MG tablet Take 1 tablet (25 mg total) by mouth 2 (two) times daily as needed for dizziness.   methocarbamol (ROBAXIN) 500 MG tablet Take 1 tablet (500 mg total) by mouth every 8 (eight) hours as needed for muscle spasms.   montelukast (SINGULAIR) 10 MG tablet TAKE 1 TABLET BY MOUTH AT  BEDTIME   Multiple Vitamin (MULTIVITAMIN WITH MINERALS) TABS tablet Take 1 tablet by mouth every morning.   mupirocin ointment (BACTROBAN) 2 % Apply 1 Application topically 2 (two) times daily. To affected area   pantoprazole (PROTONIX) 40 MG tablet TAKE 1 TABLET BY MOUTH TWICE  DAILY   RSV vaccine recomb adjuvanted (AREXVY) 120 MCG/0.5ML injection Inject 0.5 mLs into the muscle.   silodosin (RAPAFLO) 8 MG CAPS capsule Take 1 capsule (8 mg total) by mouth at bedtime.   sodium chloride 1 g tablet Take 1 tablet (1 g total) by mouth 2 (two) times daily with a meal.   valsartan (DIOVAN) 40 MG tablet Take 1 tablet (40 mg total) by mouth daily.   [DISCONTINUED] Continuous Glucose Sensor (FREESTYLE LIBRE 3 SENSOR) MISC 2 each by Does not apply route every 14 (fourteen) days. Place 1 sensor on the skin every 14 days. Use to check glucose continuously   No facility-administered encounter medications on file as of 07/07/2023.     Review of Systems  Review of Systems  N/a Physical Exam  BP 124/72   Pulse 61   Ht 5\' 9"  (1.753 m)   Wt 186 lb (84.4 kg)   SpO2 93% Comment: on RA  BMI 27.47 kg/m   Wt Readings from Last 5 Encounters:  07/07/23 186 lb  (84.4 kg)  04/26/23 183 lb 6.4 oz (83.2 kg)  04/18/23 187 lb (84.8 kg)  03/25/23 185 lb 4.8 oz (84.1 kg)  02/22/23 175 lb (79.4 kg)    BMI Readings from Last 5 Encounters:  07/07/23 27.47 kg/m  04/26/23 27.08 kg/m  04/18/23 27.62 kg/m  03/25/23 27.36 kg/m  02/22/23 25.84 kg/m     Physical Exam General: Well-appearing, in no acute distress Eyes: EOMI, no icterus Neck: Supple no JVP Cardiovascular: Regular in rhythm, no murmurs Pulmonary: Clear to auscultation bilaterally, no wheezing Abdomen: Nondistended, bowel sounds present MSK: No synovitis, no joint effusion Neuro: Normal gait, no weakness Psych: Normal mood, full affect   Assessment & Plan:   Chronic Cough: Likely multifactorial. Improved with Dulera in past although more recently Breo did not help. Begs question of cough variant asthma.  Did not improve with high-dose Dulera or low-dose gabapentin.  Has improved historically with prolonged steroid therapy.  DOE: Likely multifactorial.  Worsening in the summer 2022 related to anemia, hemoglobin found to be in 6s.  Likely chronic GI losses in the setting of his AVM.  On iron supplementation and improving.  Likely some contribution from mild but stable signs of interstitial lung disease on CT chest.  Worsened after RSV pneumonia, now improving again.  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  ILD: Mild on imaging.  Relatively  stable.  Given mild fibrosis discussed risk and benefits of antifibrotic's.  Agreed to start Ofev.  Lab work, LFTs today.  Paperwork for Ofev hopefully can start soon once he gets Therapist, occupational.  Cost may be a barrier.  Discussed side effect profile.   Return in about 6 months (around 01/07/2024).   Karren Burly, MD 07/07/2023  I spent 41 minutes in care of this patient including face-to-face visit, review of records, coordination of care.

## 2023-07-18 ENCOUNTER — Ambulatory Visit (INDEPENDENT_AMBULATORY_CARE_PROVIDER_SITE_OTHER): Payer: Medicare Other | Admitting: Neurology

## 2023-07-18 ENCOUNTER — Encounter: Payer: Self-pay | Admitting: Neurology

## 2023-07-18 VITALS — BP 153/68 | HR 80 | Ht 69.0 in | Wt 187.0 lb

## 2023-07-18 DIAGNOSIS — R292 Abnormal reflex: Secondary | ICD-10-CM

## 2023-07-18 DIAGNOSIS — R2681 Unsteadiness on feet: Secondary | ICD-10-CM | POA: Diagnosis not present

## 2023-07-18 DIAGNOSIS — G620 Drug-induced polyneuropathy: Secondary | ICD-10-CM | POA: Diagnosis not present

## 2023-07-18 DIAGNOSIS — M542 Cervicalgia: Secondary | ICD-10-CM | POA: Diagnosis not present

## 2023-07-18 NOTE — Patient Instructions (Signed)
[facility] will contact you to schedule MRI cervical spine  We will refer you to start balance therapy  I will see you back in 1 year, or sooner as needed

## 2023-07-18 NOTE — Progress Notes (Signed)
Follow-up Visit   Date: 07/18/23   Gregory [name] MRN: [id number] DOB: 11/28/1941   Interim History: Gregory [name] is a 82 y.o. right-handed Caucasian male with hypothyroidism, hyperlipidemia, Osler-Weber- Rendu disease, GERD, and left external basal ganglia polymicrobial abscess (2016) returning to the clinic for follow-up of gait unsteadiness, neuropathy, and new complains of neck pain.    The patient was accompanied to the clinic by self.  IMPRESSION/PLAN: Gait unsteadiness, worse with turning and walking backwards.  Possibly contributed by leg spasticity from history of intracranial abscess (2016) and neuropathy  - Start out-patient PT for balance training Cervicalgia Hyperreflexia is most likely due to old intracranial abscess, however, with his new neck complaints, I want to be sure we are not overlooking cervical canal stenosis so will obtain CT cervical spine wo contrast.  He is unable to get MRI due to metal face plate Drug-induced neuropathy, stable and mild.   - He takes gabapentin 100mg  BID for chronic cough.  He does not wish to increase the dose due to intolerance in the past.  5. 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     Return to clinic in 1 year  ------------------------------------------------------- History of present illness: He was hospitalized from May 24 - May 21 2015 with right sided leg weakness and headaches. Patient was found to have a biopsy-proven brain abscess 3 x 2 cm involving the basal ganglia with midline shift and edema seen on CT head.  IV antibiotics including vancomycin, rocephin, and flagyl was started.  He was also started on keppra 500mg  BID.  Of note, he had routine dental work 2 weeks prior to symptom onset.  He has completed his course of antibiotics therapy with IV for 6 weeks and transitioned to oral levofloxacin and metronidazole for  4 weeks.  Starting ~ late July 2016, he started noticing numbness/tingling of the toes and shooting pain involving the sole of the foot.  Symptoms are constant and involve both feet equally.  Shooting pain is worse with weight bearing.  Nothing alleviates the pain.  He endorses mild low back pain.  These symptoms were thought to be due to flagyl-induced neuropathy and slowly improved.   UPDATE 04/06/2021: He continues to have issues with imbalance, especially with turning.  Fortunately, he has not had any falls. He continues to walk unassisted.  He also has problems with remembering names.  No other memory issues.  He manages finances, medication, drives, and household chores.  Sometimes, he feels that his speech may mumble, especially if he talks fast.  No slurred speech. Last summer, he has a syncopal event at church, thought to be medication-induced.  He has not had any other events like this.  Numbness in the feet is stable and unchanged.  UPDATE 08/04/2022: He is here for follow-up visit.  He reports having a spell of confusion following TURP on 03/02/2022 which lasted 2-3 hours.  During this time, he was confused and unable to think of what he wanted to say and get the words out.  No focal weakness.  CT head did not show acute stroke.  EEG was normal.  BP has been elevated and being monitored by PCP.  He has not had this recur.  He continues to be forgetful with tasks and tends to make a lot of notes, but able to manage all IADLs.  His neuropathy is stable with numbness restricted to the feet.  Balance continues to be a problem.  He has one fall earlier this year from loosing his balance over a threshold. He walks unassisted.  He was going to start PT but this has been delayed.   UPDATE 07/18/2023:  He is here for 1 year follow-up visit.  He reports noticing that his balance is getting worse.  He has one fall when at home last week and another one earlier this year.  Fortunately, he did not suffer any  injuries.  He also has neck pain and stiffness.  No new weakness in the arms or leg.  His neuropathy is unchanged and continues to have numbness/tingling in the feet.  He takes gabapentin 100mg  BID. He was hospitalized in January with RSV and was on oxygen until last month.     Medications:  Current Outpatient Medications on File Prior to Visit  Medication Sig Dispense Refill   acetaminophen (TYLENOL) 325 MG tablet Take 2 tablets (650 mg total) by mouth every 6 (six) hours as needed for mild pain (or Fever >/= 101).     Ascorbic Acid (VITAMIN C) 1000 MG tablet Take 500 mg by mouth daily.     Budeson-Glycopyrrol-Formoterol (BREZTRI AEROSPHERE) 160-9-4.8 MCG/ACT AERO Inhale 2 puffs into the lungs in the morning and at bedtime. 1 each 0   Budeson-Glycopyrrol-Formoterol (BREZTRI AEROSPHERE) 160-9-4.8 MCG/ACT AERO Inhale 2 puffs into the lungs in the morning and at bedtime. 4 each 0   Cholecalciferol 125 MCG (5000 UT) capsule Take 5,000 Units by mouth daily.     docusate sodium (COLACE) 100 MG capsule Take 200 mg by mouth at bedtime.     fluorometholone (FML) 0.1 % ophthalmic suspension Place 1 drop into both eyes 2 (two) times daily.     gabapentin (NEURONTIN) 100 MG capsule Take 1 capsule (100 mg total) by mouth 2 (two) times daily. 180 each 3   Iron, Ferrous Sulfate, 325 (65 Fe) MG TABS Take 325 mg by mouth in the morning, at noon, and at bedtime. 30 tablet 2   levocetirizine (XYZAL) 5 MG tablet Take 1 tablet (5 mg total) by mouth every evening. 90 tablet 1   levothyroxine (SYNTHROID) 150 MCG tablet Take 1 tablet (150 mcg total) by mouth daily before breakfast. 90 tablet 3   magic mouthwash (nystatin, hydrocortisone, diphenhydrAMINE) suspension Swish and swallow 5 mLs 3 (three) times daily as needed (mouth and throat irritation). 480 mL 3   Magnesium 250 MG TABS Take 250 mg by mouth at bedtime.      meclizine (ANTIVERT) 25 MG tablet Take 1 tablet (25 mg total) by mouth 2 (two) times daily as needed  for dizziness. 30 tablet 6   methocarbamol (ROBAXIN) 500 MG tablet Take 1 tablet (500 mg total) by mouth every 8 (eight) hours as needed for muscle spasms. 270 tablet 0   montelukast (SINGULAIR) 10 MG tablet TAKE 1 TABLET BY MOUTH AT  BEDTIME 90 tablet 3   Multiple Vitamin (MULTIVITAMIN WITH MINERALS) TABS tablet Take 1 tablet by mouth every morning.     mupirocin ointment (BACTROBAN) 2 % Apply 1 Application topically 2 (two) times daily. To affected area 22 g 0   pantoprazole (PROTONIX) 40 MG tablet TAKE 1 TABLET BY MOUTH TWICE  DAILY 180 tablet 3   RSV vaccine recomb adjuvanted (AREXVY) 120 MCG/0.5ML injection Inject 0.5 mLs into the muscle. 0.5 mL 0   silodosin (RAPAFLO) 8 MG CAPS capsule Take 1 capsule (8 mg total) by mouth at bedtime. 90 capsule 1  sodium chloride 1 g tablet Take 1 tablet (1 g total) by mouth 2 (two) times daily with a meal. 180 tablet 3   valsartan (DIOVAN) 40 MG tablet Take 1 tablet (40 mg total) by mouth daily. 90 tablet 1   No current facility-administered medications on file prior to visit.    Allergies:  Allergies  Allergen Reactions   Nsaids Other (See Comments)    Cannot take-per MD   Other Other (See Comments)    All blood thinners-cannot take per MD   Aspirin Other (See Comments)    nosebleeds   Statins Other (See Comments)    Weakness, myalgias   Demeclocycline Other (See Comments)    Passed out   Dust Mite Extract Other (See Comments)   Tizanidine Hcl Other (See Comments)    Patient stated that after taking this medication he experienced feelings of dizziness and disorientation.      Vital Signs:  BP (!) 153/68   Pulse 80   Ht 5\' 9"  (1.753 m)   Wt 187 lb (84.8 kg)   SpO2 92%   BMI 27.62 kg/m   Neurological Exam: MENTAL STATUS including orientation to time, place, person, recent and remote memory, attention span and concentration, language, and fund of knowledge is normal.  Speech is not dysarthric.  CRANIAL NERVES:  Normal fundoscopic  exam.  Pupils equal round and reactive to light.  Normal conjugate, extra-ocular eye movements in all directions of gaze.  Right ptosis (old).  Face is symmetric and muscles are intact. Palate elevates symmetrically.  Tongue is midline.  MOTOR:  Motor strength is 5/5 in all extremities.   No pronator drift.  There is mild spasticity in the RUE and RLE (0+)  MSRs:  Reflexes are 3+/4 throughout, brisker on the right side.  SENSORY:  Vibration is reduced at the ankles, intact at the knees.    COORDINATION/GAIT:  Normal finger-to- nose-finger. Intact rapid alternating movements bilaterally.  Gait mildly wide-based, unsteady slightly with turns, unassisted.  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  Stable 4 mm LEFT-to-RIGHT midline shift.   CT head 06/24/2015:  Significant interval improvement when compared to the 05/13/2015 examination.  The ring-enhancing lesion which was centered in the left basal ganglia has decreased significantly in size. Currently, 2.3 x 0.9 cm region of relatively solid enhancement superior aspect of the left basal ganglia remains as versus prior 3.1 x 1.9 cm ring-enhancing lesion. Significant decrease in surrounding vasogenic edema and marked decrease mass effect upon the left lateral ventricle. What remains may represent residua of treated abscess. This will need to be followup until complete clearance. As this clears it would be helpful to exclude the possibility of underlying vascular abnormality in this patient with Osler-Weber-Rendu.  CT head 08/11/2015: Resolving abscess left external capsule. There is decreased edema and decreased enhancement in this area. No residual fluid collection. No other areas of acute  abnormality.  CT head wwo contrast 01/06/2016:  Hypoattenuation in the LEFT basal ganglia and regional white matter is improved from August but not yet normalized. No abnormal postcontrast enhancement to definitively suggest residual infection. Continued surveillance may be warranted, given the patient's residual RIGHT leg weakness and intermittent slurred speech.   NCS/EMG of the legs 09/16/2015: The electrophysiologic findings are most consistent with a generalized sensorimotor polyneuropathy,  axon loss in type, affecting the lower extremities. Overall, these findings are severe in degree electrically with respect to sensory responses. 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  Labs 09/11/2015:  vitamin B12 874, folate 24.8, vitamin B1 36, copper 77, vitamin B6 73.8, myasthenia panel negative  CT head 01/21/2017:  No change from the prior CT. Negative for abscess or mass lesion. Chronic encephalomalacia left external capsule related to prior cerebral abscess.  Routine 03/15/2022:  Normal   CT head 03/03/2022: 1.  No acute intracranial hemorrhage.  2.  No evidence of acute large vascular territory infarct,  large vessel occlusion, or cerebral perfusion disturbance. Because CT is relatively insensitive for the detection of ischemia within the first 24 hours, if high clinical suspicion remains for acute infarction, MRI would be recommended for further evaluation.  3.  No 50% or greater arterial stenosis within the head or neck.  4.  Slight asymmetric hypoattenuation in the left frontal white matter extending towards prior left frontal burr hole, favored to reflect chronic changes related to prior abscess. If high clinical suspicion remains for acute infarction, MRI would be recommended for further evaluation.  5.  Background of chronic small vessel disease and generalized cerebral and cerebellar atrophy.      Thank you for allowing me  to participate in patient's care.  If I can answer any additional questions, I would be pleased to do so.    Sincerely,    Hortencia Martire K. Allena Katz, DO

## 2023-07-23 ENCOUNTER — Other Ambulatory Visit (HOSPITAL_BASED_OUTPATIENT_CLINIC_OR_DEPARTMENT_OTHER): Payer: Self-pay | Admitting: Nurse Practitioner

## 2023-07-23 DIAGNOSIS — I10 Essential (primary) hypertension: Secondary | ICD-10-CM

## 2023-07-23 DIAGNOSIS — R053 Chronic cough: Secondary | ICD-10-CM

## 2023-07-25 ENCOUNTER — Telehealth: Payer: Self-pay

## 2023-07-25 DIAGNOSIS — R2681 Unsteadiness on feet: Secondary | ICD-10-CM

## 2023-07-25 NOTE — Telephone Encounter (Signed)
Refill request last apt 04/26/23

## 2023-07-25 NOTE — Telephone Encounter (Signed)
Tried to call patient, no answer, Per [facility], Patient to find a better suited Facility of outpatient PT.

## 2023-07-26 ENCOUNTER — Ambulatory Visit (INDEPENDENT_AMBULATORY_CARE_PROVIDER_SITE_OTHER): Payer: Medicare Other | Admitting: Nurse Practitioner

## 2023-07-26 ENCOUNTER — Other Ambulatory Visit: Payer: Self-pay | Admitting: Nurse Practitioner

## 2023-07-26 ENCOUNTER — Encounter: Payer: Self-pay | Admitting: Nurse Practitioner

## 2023-07-26 VITALS — BP 118/64 | HR 70 | Wt 184.4 lb

## 2023-07-26 DIAGNOSIS — R2689 Other abnormalities of gait and mobility: Secondary | ICD-10-CM | POA: Diagnosis not present

## 2023-07-26 DIAGNOSIS — E039 Hypothyroidism, unspecified: Secondary | ICD-10-CM

## 2023-07-26 DIAGNOSIS — R109 Unspecified abdominal pain: Secondary | ICD-10-CM | POA: Diagnosis not present

## 2023-07-26 DIAGNOSIS — I10 Essential (primary) hypertension: Secondary | ICD-10-CM | POA: Diagnosis not present

## 2023-07-26 DIAGNOSIS — E222 Syndrome of inappropriate secretion of antidiuretic hormone: Secondary | ICD-10-CM

## 2023-07-26 DIAGNOSIS — K219 Gastro-esophageal reflux disease without esophagitis: Secondary | ICD-10-CM

## 2023-07-26 DIAGNOSIS — E559 Vitamin D deficiency, unspecified: Secondary | ICD-10-CM

## 2023-07-26 DIAGNOSIS — M546 Pain in thoracic spine: Secondary | ICD-10-CM

## 2023-07-26 LAB — IRON,TIBC AND FERRITIN PANEL

## 2023-07-26 LAB — POCT URINALYSIS DIP (CLINITEK)
Bilirubin, UA: NEGATIVE
Blood, UA: NEGATIVE
Glucose, UA: NEGATIVE mg/dL
Ketones, POC UA: NEGATIVE mg/dL
Leukocytes, UA: NEGATIVE
Nitrite, UA: NEGATIVE
POC PROTEIN,UA: NEGATIVE
Spec Grav, UA: 1.02 (ref 1.010–1.025)
Urobilinogen, UA: 0.2 E.U./dL
pH, UA: 6 (ref 5.0–8.0)

## 2023-07-26 LAB — CBC WITH DIFFERENTIAL/PLATELET
Basophils Absolute: 0 10*3/uL (ref 0.0–0.2)
Basos: 1 %
EOS (ABSOLUTE): 0 10*3/uL (ref 0.0–0.4)
Eos: 1 %
Hematocrit: 44.6 % (ref 37.5–51.0)
Hemoglobin: 15.6 g/dL (ref 13.0–17.7)
Immature Grans (Abs): 0 10*3/uL (ref 0.0–0.1)
Immature Granulocytes: 0 %
Lymphocytes Absolute: 2.9 10*3/uL (ref 0.7–3.1)
Lymphs: 36 %
MCH: 32.3 pg (ref 26.6–33.0)
MCHC: 35 g/dL (ref 31.5–35.7)
MCV: 92 fL (ref 79–97)
Monocytes Absolute: 0.9 10*3/uL (ref 0.1–0.9)
Monocytes: 11 %
Neutrophils Absolute: 4.3 10*3/uL (ref 1.4–7.0)
Neutrophils: 51 %
Platelets: 278 10*3/uL (ref 150–450)
RBC: 4.83 x10E6/uL (ref 4.14–5.80)
RDW: 12.8 % (ref 11.6–15.4)
WBC: 8.3 10*3/uL (ref 3.4–10.8)

## 2023-07-26 LAB — COMPREHENSIVE METABOLIC PANEL

## 2023-07-26 LAB — VITAMIN B12

## 2023-07-26 LAB — VITAMIN D 25 HYDROXY (VIT D DEFICIENCY, FRACTURES)

## 2023-07-26 LAB — TSH

## 2023-07-26 LAB — MAGNESIUM

## 2023-07-26 MED ORDER — PREDNISONE 10 MG PO TABS
20.0000 mg | ORAL_TABLET | Freq: Every day | ORAL | 0 refills | Status: DC
Start: 2023-07-26 — End: 2023-09-27

## 2023-07-26 NOTE — Progress Notes (Signed)
Gregory Clamp, DNP, AGNP-c [facility]  [address] [place], [place] [address] [phone]  ESTABLISHED PATIENT- Chronic Health and/or Follow-Up Visit  Blood pressure 118/64, pulse 70, weight 184 lb 6.4 oz (83.6 kg).    Gregory [name] is a 82 y.o. year old male presenting today for evaluation and management of chronic conditions.   Balance [name] reports increased stiff neck, neurology wanted him to get a CT of this to ensure no concerns. She has also ordered PT for him. He is working at [facility] on balance and neck pain.   Flank Pain [name] reports pain in the left flank for about 3 weeks. He tells me the pain starts about the area of the left kidney and wraps around to the front of the abdomen. He has not had any changes in bowel or bladder habits. He initially thought the symptoms were related to a pulled muscle, but this does not seem to be improving as he would expect a muscle to improve. He has not had any fevers or chills.   Epistaxis [name] reports several nose bleeds recently, which are heavier and more frequent than his baseline. He does have a history of Osler's disease, which he isn't sure if this is contributing. He has no other signs of bleeding or bruising at this time.   Mohs surgery on the head and the neck doing well. On the left side of the face one stitch is still present.   All ROS negative with exception of what is listed above.   PHYSICAL EXAM Physical Exam Vitals and nursing note reviewed.  Constitutional:      Appearance: Normal appearance.  HENT:     Head: Normocephalic.  Eyes:     Pupils: Pupils are equal, round, and reactive to light.  Cardiovascular:     Rate and Rhythm: Normal rate and regular rhythm.     Pulses: Normal pulses.     Heart sounds: Normal heart sounds.  Pulmonary:     Effort: Pulmonary effort is normal.     Breath sounds: Normal breath sounds.  Abdominal:     General: Bowel sounds are normal. There is no  distension.     Palpations: Abdomen is soft. There is no mass.     Tenderness: There is left CVA tenderness. There is no right CVA tenderness, guarding or rebound.  Musculoskeletal:        General: Tenderness present. Normal range of motion.     Cervical back: Normal range of motion.     Right lower leg: No edema.     Left lower leg: No edema.  Skin:    General: Skin is warm and dry.     Findings: No bruising.  Neurological:     General: No focal deficit present.     Mental Status: He is alert and oriented to person, place, and time.     Sensory: Sensory deficit present.     Gait: Gait abnormal.  Psychiatric:        Mood and Affect: Mood normal.        Behavior: Behavior normal.     PLAN Problem List Items Addressed This Visit     RESOLVED: Essential hypertension (Chronic)    Blood pressure no longer elevated. Will resolve this concern.       Relevant Orders   Magnesium (Completed)   Comprehensive metabolic panel (Completed)   Iron, TIBC and Ferritin Panel (Completed)   Vitamin B12 (Completed)   CBC with Differential/Platelet (Completed)  TSH (Completed)   POCT URINALYSIS DIP (CLINITEK) (Completed)   Hypothyroidism    Chronic. Labs have been stable. We will recheck these today. At this time no alarm symptoms. Will continue current treatment and make changes to the plan of care as necessary based on findings.       Relevant Orders   Magnesium (Completed)   Comprehensive metabolic panel (Completed)   Iron, TIBC and Ferritin Panel (Completed)   Vitamin B12 (Completed)   CBC with Differential/Platelet (Completed)   TSH (Completed)   POCT URINALYSIS DIP (CLINITEK) (Completed)   GERD    [name] continues to experience GERD symptoms despite daily pantoprazole use. He has been using Tums for breakthrough symptoms and this is helpful. He is managed by GI. We will plan to continue current treatment. No alarm symptoms are present. OK to add Tums as needed for control.        Relevant Orders   Magnesium (Completed)   Comprehensive metabolic panel (Completed)   Iron, TIBC and Ferritin Panel (Completed)   Vitamin B12 (Completed)   CBC with Differential/Platelet (Completed)   TSH (Completed)   POCT URINALYSIS DIP (CLINITEK) (Completed)   SIADH (syndrome of inappropriate ADH production) (HCC)    Chronic. Currently managing well with no alarm symptoms. Taking salt tablets for increased intake. This appears to have improved BP. Will monitor.       Relevant Orders   Magnesium (Completed)   Comprehensive metabolic panel (Completed)   Iron, TIBC and Ferritin Panel (Completed)   Vitamin B12 (Completed)   CBC with Differential/Platelet (Completed)   TSH (Completed)   POCT URINALYSIS DIP (CLINITEK) (Completed)   Balance problem - Primary    Continue with therapy. Will monitor for results of CT. No alarm symptoms present at this time to warrant immediate escalation.       Relevant Orders   Magnesium (Completed)   Comprehensive metabolic panel (Completed)   Iron, TIBC and Ferritin Panel (Completed)   Vitamin B12 (Completed)   CBC with Differential/Platelet (Completed)   TSH (Completed)   POCT URINALYSIS DIP (CLINITEK) (Completed)   Acute left flank pain    Intermittent left flank pain, concerning for kidney stone. We discussed this as a possible cause and will plan to monitor his urine today. Recommend increased fluid intake. It could be muscular in nature, as well. If urine is clear, recommend heat and ice to the area and notify if no improvement.       Relevant Orders   Magnesium (Completed)   Comprehensive metabolic panel (Completed)   Iron, TIBC and Ferritin Panel (Completed)   Vitamin B12 (Completed)   CBC with Differential/Platelet (Completed)   TSH (Completed)   POCT URINALYSIS DIP (CLINITEK) (Completed)   Other Visit Diagnoses     Vitamin D deficiency       Relevant Orders   VITAMIN D 25 Hydroxy (Vit-D Deficiency, Fractures) (Completed)        No follow-ups on file.   Gregory Clamp, DNP, AGNP-c

## 2023-07-27 ENCOUNTER — Ambulatory Visit
Admission: RE | Admit: 2023-07-27 | Discharge: 2023-07-27 | Disposition: A | Discharge status: Home / Self Care | Payer: Medicare Other | Source: Ambulatory Visit | Attending: Neurology | Admitting: Neurology

## 2023-07-27 DIAGNOSIS — M542 Cervicalgia: Secondary | ICD-10-CM

## 2023-07-27 DIAGNOSIS — R292 Abnormal reflex: Secondary | ICD-10-CM

## 2023-07-28 NOTE — Telephone Encounter (Signed)
Called patient and informed him that [facility] feels he would be better suited to another Physical Therapy facility. Informed patient that if it is ok we will send him to [facility]. Patient stated that he is ok with this. Patient he aware that we will get that referral sent for him and [facility] will give him a call to get him scheduled.

## 2023-08-03 ENCOUNTER — Inpatient Hospital Stay: Payer: Medicare Other | Attending: Nurse Practitioner

## 2023-08-03 DIAGNOSIS — K5521 Angiodysplasia of colon with hemorrhage: Secondary | ICD-10-CM | POA: Diagnosis not present

## 2023-08-03 DIAGNOSIS — D500 Iron deficiency anemia secondary to blood loss (chronic): Secondary | ICD-10-CM | POA: Insufficient documentation

## 2023-08-03 LAB — FERRITIN: Ferritin: 34 ng/mL (ref 24–336)

## 2023-08-03 LAB — CBC WITH DIFFERENTIAL (CANCER CENTER ONLY)
Abs Immature Granulocytes: 0.04 10*3/uL (ref 0.00–0.07)
Basophils Absolute: 0 10*3/uL (ref 0.0–0.1)
Basophils Relative: 0 %
Eosinophils Absolute: 0 10*3/uL (ref 0.0–0.5)
Eosinophils Relative: 0 %
HCT: 42.1 % (ref 39.0–52.0)
Hemoglobin: 15.1 g/dL (ref 13.0–17.0)
Immature Granulocytes: 0 %
Lymphocytes Relative: 16 %
Lymphs Abs: 1.5 10*3/uL (ref 0.7–4.0)
MCH: 31.9 pg (ref 26.0–34.0)
MCHC: 35.9 g/dL (ref 30.0–36.0)
MCV: 89 fL (ref 80.0–100.0)
Monocytes Absolute: 0.3 10*3/uL (ref 0.1–1.0)
Monocytes Relative: 4 %
Neutro Abs: 7.5 10*3/uL (ref 1.7–7.7)
Neutrophils Relative %: 80 %
Platelet Count: 295 10*3/uL (ref 150–400)
RBC: 4.73 MIL/uL (ref 4.22–5.81)
RDW: 13 % (ref 11.5–15.5)
WBC Count: 9.4 10*3/uL (ref 4.0–10.5)
nRBC: 0 % (ref 0.0–0.2)

## 2023-08-04 ENCOUNTER — Telehealth: Payer: Self-pay

## 2023-08-04 NOTE — Telephone Encounter (Signed)
Patient gave verbal understanding and had no further questions or concerns  

## 2023-08-04 NOTE — Telephone Encounter (Signed)
-----   Message from Thornton Papas sent at 08/03/2023  3:56 PM EDT ----- Please call patient, the hemoglobin and iron level are normal, follow-up as scheduled

## 2023-08-11 ENCOUNTER — Encounter: Payer: Self-pay | Admitting: Nurse Practitioner

## 2023-08-11 DIAGNOSIS — R109 Unspecified abdominal pain: Secondary | ICD-10-CM | POA: Insufficient documentation

## 2023-08-11 HISTORY — DX: Unspecified abdominal pain: R10.9

## 2023-08-11 NOTE — Assessment & Plan Note (Signed)
Continue with therapy. Will monitor for results of CT. No alarm symptoms present at this time to warrant immediate escalation.

## 2023-08-11 NOTE — Assessment & Plan Note (Signed)
Chronic. Labs have been stable. We will recheck these today. At this time no alarm symptoms. Will continue current treatment and make changes to the plan of care as necessary based on findings.

## 2023-08-11 NOTE — Assessment & Plan Note (Signed)
Intermittent left flank pain, concerning for kidney stone. We discussed this as a possible cause and will plan to monitor his urine today. Recommend increased fluid intake. It could be muscular in nature, as well. If urine is clear, recommend heat and ice to the area and notify if no improvement.

## 2023-08-11 NOTE — Assessment & Plan Note (Signed)
Blood pressure no longer elevated. Will resolve this concern.

## 2023-08-11 NOTE — Addendum Note (Signed)
Addended by: Prentice Sackrider, Huntley Dec E on: 08/11/2023 07:17 PM   Modules accepted: Level of Service

## 2023-08-11 NOTE — Assessment & Plan Note (Signed)
[name] continues to experience GERD symptoms despite daily pantoprazole use. He has been using Tums for breakthrough symptoms and this is helpful. He is managed by GI. We will plan to continue current treatment. No alarm symptoms are present. OK to add Tums as needed for control.

## 2023-08-11 NOTE — Assessment & Plan Note (Signed)
Chronic. Currently managing well with no alarm symptoms. Taking salt tablets for increased intake. This appears to have improved BP. Will monitor.

## 2023-08-24 ENCOUNTER — Telehealth: Payer: Self-pay | Admitting: Pulmonary Disease

## 2023-08-24 NOTE — Telephone Encounter (Signed)
Pt wants to know how often does PT need to take RSV Shot

## 2023-08-25 ENCOUNTER — Encounter: Payer: Self-pay | Admitting: Pulmonary Disease

## 2023-08-25 NOTE — Telephone Encounter (Signed)
Per CDC RSV recommendation every 2 yrs. Spouse advised.

## 2023-08-30 ENCOUNTER — Ambulatory Visit: Payer: Medicare Other | Attending: Cardiology | Admitting: Cardiology

## 2023-08-30 ENCOUNTER — Encounter: Payer: Self-pay | Admitting: Cardiology

## 2023-08-30 ENCOUNTER — Other Ambulatory Visit (HOSPITAL_BASED_OUTPATIENT_CLINIC_OR_DEPARTMENT_OTHER): Payer: Self-pay

## 2023-08-30 VITALS — BP 120/62 | HR 66 | Ht 69.0 in | Wt 186.0 lb

## 2023-08-30 DIAGNOSIS — I4892 Unspecified atrial flutter: Secondary | ICD-10-CM | POA: Insufficient documentation

## 2023-08-30 DIAGNOSIS — I951 Orthostatic hypotension: Secondary | ICD-10-CM | POA: Diagnosis not present

## 2023-08-30 DIAGNOSIS — E7800 Pure hypercholesterolemia, unspecified: Secondary | ICD-10-CM | POA: Insufficient documentation

## 2023-08-30 MED ORDER — COVID-19 MRNA VAC-TRIS(PFIZER) 30 MCG/0.3ML IM SUSY
0.3000 mL | PREFILLED_SYRINGE | Freq: Once | INTRAMUSCULAR | 0 refills | Status: AC
  Filled 2023-08-30: qty 0.3, 1d supply, fill #0

## 2023-08-30 NOTE — Progress Notes (Signed)
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    Admitted 5/16 with headache and right lower extremity weakness. CT of the head demonstrated a brain mass measuring 3 x 2 cm with midline shift and edema.  This was a ring enhancing mass in the left basal ganglia.  He underwent stereotactic biopsy by neurosurgery. He was diagnosed with a brain abscess.  Notes from ID indicate his cultures grew out Peptostreptococcus sp.  Patient was followed by infectious disease in the hospital and placed on vancomycin, Rocephin and Flagyl.  He was also placed on Keppra for seizure prophylaxis. 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    Following DC, patient had an episode of elevated heart rate. EMS was called and he was noted to be in atrial flutter. This converted to sinus rhythm spontaneously. Patient placed on toprol. Event monitor showed slow atrial flutter versus ectopic atrial tachycardia.    Monitor July 2021 showed sinus bradycardia, normal sinus rhythm and sinus tachycardia.  Patient did have symptoms of dizziness with monitor in place.  Previously complained of dyspnea.  Hemoglobin found to be 6.8 and he was transfused and placed on iron with improvement.  Carotid Dopplers August 2023 showed no significant stenosis.  Echocardiogram January 2024 showed normal LV function, mild left ventricular hypertrophy.  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  Since he was last seen he has some dyspnea on exertion related to previous RSV infection.  No orthopnea, PND, pedal edema, chest pain or syncope.  Current Outpatient Medications  Medication Sig Dispense Refill   acetaminophen (TYLENOL) 325 MG tablet Take 2 tablets (650 mg total) by mouth every 6 (six) hours as needed for  mild pain (or Fever >/= 101).     Ascorbic Acid (VITAMIN C) 1000 MG tablet Take 500 mg by mouth daily.     Budeson-Glycopyrrol-Formoterol (BREZTRI AEROSPHERE) 160-9-4.8 MCG/ACT AERO Inhale 2 puffs into the lungs in the morning and at bedtime. 4 each 0   Cholecalciferol 125 MCG (5000 UT) capsule Take 5,000 Units by mouth daily.     COVID-19 mRNA vaccine 2024-2025 (COMIRNATY) syringe Inject 0.3 mLs into the muscle once for 1 dose. 0.3 mL 0   docusate sodium (COLACE) 100 MG capsule Take 200 mg by mouth at bedtime.     fluorometholone (FML) 0.1 % ophthalmic suspension Place 1 drop into both eyes 2 (two) times daily.     gabapentin (NEURONTIN) 100 MG capsule TAKE 1 CAPSULE (100 MG TOTAL) BY MOUTH 2 (TWO) TIMES DAILY. 180 capsule 3   Iron, Ferrous Sulfate, 325 (65 Fe) MG TABS Take 325 mg by mouth in the morning, at noon, and at bedtime. 30 tablet 2   levocetirizine (XYZAL) 5 MG tablet Take 1 tablet (5 mg total) by mouth every evening. 90 tablet 1   levothyroxine (SYNTHROID) 150 MCG tablet Take 1 tablet (150 mcg total) by mouth daily before breakfast. 90 tablet 3   magic mouthwash (nystatin, hydrocortisone, diphenhydrAMINE) suspension Swish and swallow 5 mLs 3 (three) times daily as needed (mouth and throat irritation). 480 mL 3   Magnesium 250 MG TABS Take 250 mg by mouth at bedtime.      meclizine (ANTIVERT) 25 MG tablet Take 1 tablet (25 mg total) by mouth 2 (two) times daily as needed for dizziness. 30 tablet 6  methocarbamol (ROBAXIN) 500 MG tablet Take 1 tablet (500 mg total) by mouth every 8 (eight) hours as needed for muscle spasms. 270 tablet 0   montelukast (SINGULAIR) 10 MG tablet TAKE 1 TABLET BY MOUTH AT  BEDTIME 90 tablet 3   Multiple Vitamin (MULTIVITAMIN WITH MINERALS) TABS tablet Take 1 tablet by mouth every morning.     mupirocin ointment (BACTROBAN) 2 % Apply 1 Application topically 2 (two) times daily. To affected area 22 g 0   pantoprazole (PROTONIX) 40 MG tablet TAKE 1 TABLET BY  MOUTH TWICE  DAILY 180 tablet 3   predniSONE (DELTASONE) 10 MG tablet Take 2 tablets (20 mg total) by mouth daily with breakfast. 10 tablet 0   RSV vaccine recomb adjuvanted (AREXVY) 120 MCG/0.5ML injection Inject 0.5 mLs into the muscle. 0.5 mL 0   silodosin (RAPAFLO) 8 MG CAPS capsule Take 1 capsule (8 mg total) by mouth at bedtime. 90 capsule 1   sodium chloride 1 g tablet Take 1 tablet (1 g total) by mouth 2 (two) times daily with a meal. 180 tablet 3   valsartan (DIOVAN) 40 MG tablet TAKE 1 TABLET BY MOUTH DAILY 90 tablet 1   Budeson-Glycopyrrol-Formoterol (BREZTRI AEROSPHERE) 160-9-4.8 MCG/ACT AERO Inhale 2 puffs into the lungs in the morning and at bedtime. (Patient not taking: Reported on 08/30/2023) 1 each 0   No current facility-administered medications for this visit.     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   Essential hypertension 12/27/2022   GERD (gastroesophageal reflux disease)    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  WITHOUT HEMOPTYSIS   History of nonmelanoma skin cancer EXCISION SQUAMOUS CELL FROM HAND   Hypertrophy of prostate with urinary obstruction and other lower urinary tract symptoms (LUTS)    ILD (interstitial lung disease) (HCC)    Irritable bowel syndrome    Nocturnal leg cramps 06/11/2013   Pain in both lower extremities 10/28/2022   Plantar fascial fibromatosis    Pressure ulcer caused by device 01/07/2023   Pure hypercholesterolemia    RSV (respiratory syncytial virus pneumonia) 01/07/2023   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  Added automatically from request for surgery [id number]   Vaccine counseling 01/24/2023    Past Surgical History:  Procedure Laterality Date   APPLICATION OF CRANIAL NAVIGATION N/A 05/16/2015   Procedure: APPLICATION OF CRANIAL NAVIGATION;  Surgeon: Lisbeth Renshaw, MD;  Location: [facility];  Service: Neurosurgery;  Laterality: N/A;   BRAIN BIOPSY Left 05/16/2015   Procedure: Stereotactic Left Brain Biopsy with Brain Lab;  Surgeon: Lisbeth Renshaw, MD;  Location: [facility];  Service: Neurosurgery;  Laterality: Left;  Stereotactic Left Brain biopsy with brainlab   BRAIN SURGERY     CARDIOVASCULAR STRESS TEST  01/14/2003   NO ISCHEMIA / EF 61%/ NORMAL LE WALL MOTION  EXCISION LEFT WRIST GANGLIAN/ MYXOID CYST  05/30/2009   IR RADIOLOGIST EVAL & MGMT  02/01/2019   NASAL SINUS SURGERY     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 Sharyn Lull   TRANSTHORACIC ECHOCARDIOGRAM  10-17-2008   DR Halle Davlin   NORMAL LVF/ EF 60%/  MILDLY DILATED RIGHT ATRIUM/ VENTRICULE   VARICOCELECTOMY  1991   Dr. Gaynelle Arabian    Social History   Socioeconomic History   Marital status: Married    Spouse name: Dewayne Hatch   Number of children: 2   Years of education: Not on file   Highest education level: Master's degree (e.g., MA, MS, MEng, MEd, MSW, MBA)  Occupational History    Comment: Consultant   Occupation: retired  Tobacco Use   Smoking status: Never   Smokeless tobacco: Never  Vaping Use   Vaping status: Never Used  Substance and Sexual Activity    Alcohol use: No    Alcohol/week: 0.0 standard drinks of alcohol   Drug use: No   Sexual activity: Not on file  Other Topics Concern   Not on file  Social History Narrative   Lives with wife in a one story home.  Has 2 sons.  Retired Equities trader.  Education: Masters degree.      05/09/19- Pt states that his balance is worse since last visit. He has not fallen but has come close on a few occassions. The weakness on his right side is the same, he states.      Right Handed   Drinks caffeine    Social Determinants of Health   Financial Resource Strain: Low Risk  (04/22/2023)   Overall Financial Resource Strain (CARDIA)    Difficulty of Paying Living Expenses: Not hard at all  Food Insecurity: No Food Insecurity (04/22/2023)   Hunger Vital Sign    Worried About Running Out of Food in the Last Year: Never true    Ran Out of Food in the Last Year: Never true  Transportation Needs: No Transportation Needs (04/22/2023)   PRAPARE - Administrator, Civil Service (Medical): No    Lack of Transportation (Non-Medical): No  Physical Activity: Insufficiently Active (04/22/2023)   Exercise Vital Sign    Days of Exercise per Week: 2 days    Minutes of Exercise per Session: 20 min  Stress: No Stress Concern Present (04/22/2023)   [employer] of Occupational Health - Occupational Stress Questionnaire    Feeling of Stress : Not at all  Social Connections: Socially Integrated (04/22/2023)   Social Connection and Isolation Panel [NHANES]    Frequency of Communication with Friends and Family: More than three times a week    Frequency of Social Gatherings with Friends and Family: Once a week    Attends Religious Services: More than 4 times per year    Active Member of [employer] or Organizations: Yes    Attends Banker Meetings: More than 4 times per year    Marital Status: Married  Catering manager Violence: Unknown (02/16/2023)   Received from [facility]   HITS     Physically Hurt: Not on file    Insult or Talk Down To: Not on file    Threaten Physical Harm: Not on file  Scream or Curse: Not on file    Family History  Problem Relation Age of Onset   Diabetes Mother    Hypertension Mother    Pancreatic cancer Mother    Stroke Mother    Kidney failure Father    Hypertension Sister    Healthy Son     ROS: no fevers or chills, productive cough, hemoptysis, dysphasia, odynophagia, melena, hematochezia, dysuria, hematuria, rash, seizure activity, orthopnea, PND, pedal edema, claudication. Remaining systems are negative.  Physical Exam: Well-developed well-nourished in no acute distress.  Skin is warm and dry.  HEENT is normal.  Neck is supple.  Chest is clear to auscultation with normal expansion.  Cardiovascular exam is regular rate and rhythm.  Abdominal exam nontender or distended. No masses palpated. Extremities show no edema. neuro grossly intact   A/P  1 atrial flutter versus ectopic atrial tachycardia-patient has had no recurrences based on his history.  Given his history of Osler-Weber-Rendu and AV malformation/GI bleeding he is not a candidate for anticoagulation.  2 history of orthostasis-no recent symptoms.  Continue to encourage increased fluid and sodium intake.  3 coronary calcification-patient did not tolerate statins in the past.  We will check lipids and initiate Zetia or PCSK9 inhibitor if necessary.  4 hyperlipidemia-plan as outlined under #3.  Olga Millers, MD

## 2023-08-30 NOTE — Patient Instructions (Signed)
  Lab Work:  Your physician recommends that you return for lab work FASTING  If you have labs (blood work) drawn today and your tests are completely normal, you will receive your results only by: [place] (if you have [place]) OR A paper copy in the mail If you have any lab test that is abnormal or we need to change your treatment, we will call you to review the results.   Follow-Up: At [facility], you and your health needs are our priority.  As part of our continuing mission to provide you with exceptional heart care, we have created designated Provider Care Teams.  These Care Teams include your primary Cardiologist (physician) and Advanced Practice Providers (APPs -  Physician Assistants and Nurse Practitioners) who all work together to provide you with the care you need, when you need it.  We recommend signing up for the patient portal called "MyChart".  Sign up information is provided on this After Visit Summary.  MyChart is used to connect with patients for Virtual Visits (Telemedicine).  Patients are able to view lab/test results, encounter notes, upcoming appointments, etc.  Non-urgent messages can be sent to your provider as well.   To learn more about what you can do with MyChart, go to [email or web address].    Your next appointment:   12 month(s)  Provider:   Kirk Ruths, MD

## 2023-09-02 ENCOUNTER — Other Ambulatory Visit: Payer: Medicare Other

## 2023-09-03 LAB — LIPID PANEL
Chol/HDL Ratio: 3.6 ratio (ref 0.0–5.0)
Cholesterol, Total: 134 mg/dL (ref 100–199)
HDL: 37 mg/dL — ABNORMAL LOW (ref >39)
LDL Chol Calc (NIH): 83 mg/dL (ref 0–99)
Triglycerides: 67 mg/dL (ref 0–149)
VLDL Cholesterol Cal: 14 mg/dL (ref 5–40)

## 2023-09-06 ENCOUNTER — Other Ambulatory Visit: Payer: Self-pay

## 2023-09-06 ENCOUNTER — Telehealth: Payer: Self-pay | Admitting: Cardiology

## 2023-09-06 DIAGNOSIS — E785 Hyperlipidemia, unspecified: Secondary | ICD-10-CM

## 2023-09-06 DIAGNOSIS — I700 Atherosclerosis of aorta: Secondary | ICD-10-CM

## 2023-09-06 DIAGNOSIS — Z79899 Other long term (current) drug therapy: Secondary | ICD-10-CM

## 2023-09-06 DIAGNOSIS — I10 Essential (primary) hypertension: Secondary | ICD-10-CM

## 2023-09-06 MED ORDER — EZETIMIBE 10 MG PO TABS: 10.0000 mg | ORAL_TABLET | Freq: Every day | ORAL | 3 refills | Status: DC

## 2023-09-06 NOTE — Telephone Encounter (Signed)
Confirmed with primary nurse pt needed an order for zetia 10mg , once daily. Sent in to [facility] on [address]. Also ordered lipid and liver labs for 8wks. Pt informed of medication prescription and future labs via mychart.

## 2023-09-06 NOTE — Telephone Encounter (Signed)
Pt c/o medication issue:  1. Name of Medication: Ezetimibe (Zetia)  2. How are you currently taking this medication (dosage and times per day)?   3. Are you having a reaction (difficulty breathing--STAT)?   4. What is your medication issue?   Patient stated he was returned a call to [facility] regarding a prescription for this medication.

## 2023-09-07 ENCOUNTER — Ambulatory Visit (INDEPENDENT_AMBULATORY_CARE_PROVIDER_SITE_OTHER): Payer: Medicare Other | Admitting: Family Medicine

## 2023-09-07 ENCOUNTER — Encounter: Payer: Self-pay | Admitting: Family Medicine

## 2023-09-07 ENCOUNTER — Other Ambulatory Visit: Payer: Self-pay | Admitting: *Deleted

## 2023-09-07 VITALS — BP 122/64 | HR 66 | Temp 96.6°F | Wt 185.0 lb

## 2023-09-07 DIAGNOSIS — J849 Interstitial pulmonary disease, unspecified: Secondary | ICD-10-CM | POA: Diagnosis not present

## 2023-09-07 DIAGNOSIS — R051 Acute cough: Secondary | ICD-10-CM

## 2023-09-07 DIAGNOSIS — I700 Atherosclerosis of aorta: Secondary | ICD-10-CM

## 2023-09-07 DIAGNOSIS — Z79899 Other long term (current) drug therapy: Secondary | ICD-10-CM

## 2023-09-07 DIAGNOSIS — E785 Hyperlipidemia, unspecified: Secondary | ICD-10-CM

## 2023-09-07 NOTE — Progress Notes (Signed)
Subjective:    Patient ID: Gregory [name], male    DOB: 1941/01/24, 82 y.o.   MRN: [id number]  HPI He is here for consult concerning a cough that he has had since Thursday.  No fever, chills, sore throat, earache.  He does stated today he does feel a bit better.  He has been using Delsym to help with the cough.  He did a COVID test which was negative.  His main concern is RSV that he had in January.  He states that the symptoms are similar except for the fact that he is not short of breath.  He does have underlying interstitial lung disease.   Review of Systems     Objective:    Physical Exam Alert and in no distress. Tympanic membranes and canals are normal. Pharyngeal area is normal. Neck is supple without adenopathy or thyromegaly. Cardiac exam shows a regular sinus rhythm without murmurs or gallops. Lungs are clear to auscultation.        Assessment & Plan:  Acute cough  Interstitial lung disease (HCC) Since he is doing better, I do not think further intervention is really needed.  I definitely do not think this is RSV as that was his main concern.  He was comfortable with that.

## 2023-09-13 ENCOUNTER — Other Ambulatory Visit (HOSPITAL_BASED_OUTPATIENT_CLINIC_OR_DEPARTMENT_OTHER): Payer: Self-pay

## 2023-09-13 MED ORDER — INFLUENZA VAC A&B SURF ANT ADJ 0.5 ML IM SUSY
0.5000 mL | PREFILLED_SYRINGE | Freq: Once | INTRAMUSCULAR | 0 refills | Status: AC
  Filled 2023-09-13: qty 0.5, 1d supply, fill #0

## 2023-09-24 ENCOUNTER — Emergency Department (HOSPITAL_BASED_OUTPATIENT_CLINIC_OR_DEPARTMENT_OTHER): Payer: Medicare Other

## 2023-09-24 ENCOUNTER — Encounter (HOSPITAL_BASED_OUTPATIENT_CLINIC_OR_DEPARTMENT_OTHER): Payer: Self-pay

## 2023-09-24 ENCOUNTER — Emergency Department (HOSPITAL_BASED_OUTPATIENT_CLINIC_OR_DEPARTMENT_OTHER): Payer: Medicare Other | Admitting: Radiology

## 2023-09-24 ENCOUNTER — Other Ambulatory Visit: Payer: Self-pay

## 2023-09-24 ENCOUNTER — Emergency Department (HOSPITAL_BASED_OUTPATIENT_CLINIC_OR_DEPARTMENT_OTHER)
Admission: EM | Admit: 2023-09-24 | Discharge: 2023-09-24 | Disposition: A | Discharge status: Home / Self Care | Payer: Medicare Other

## 2023-09-24 DIAGNOSIS — S01112A Laceration without foreign body of left eyelid and periocular area, initial encounter: Secondary | ICD-10-CM

## 2023-09-24 DIAGNOSIS — S0990XA Unspecified injury of head, initial encounter: Secondary | ICD-10-CM | POA: Diagnosis present

## 2023-09-24 DIAGNOSIS — W108XXA Fall (on) (from) other stairs and steps, initial encounter: Secondary | ICD-10-CM

## 2023-09-24 DIAGNOSIS — R0781 Pleurodynia: Secondary | ICD-10-CM | POA: Diagnosis not present

## 2023-09-24 DIAGNOSIS — S40811A Abrasion of right upper arm, initial encounter: Secondary | ICD-10-CM

## 2023-09-24 MED ORDER — BACITRACIN ZINC 500 UNIT/GM EX OINT
TOPICAL_OINTMENT | Freq: Two times a day (BID) | CUTANEOUS | Status: DC
  Administered 2023-09-24: 1 via TOPICAL

## 2023-09-24 NOTE — ED Triage Notes (Signed)
Patient ambulatory to ED with mechanical fall from standing down 3 porch steps to concert floor. Patient hit head and L ribs. Reporting pain to ribs. Denies LOC, denies blood thinners.

## 2023-09-24 NOTE — Discharge Instructions (Signed)
Your head CT and rib x-rays did not show any sign of fracture.  No sign of a brain bleed.  Your forearm abrasions were cleaned and an antibiotic ointment was applied.  Please take off the gauze tomorrow morning and clean the area and apply new antibiotic ointment and gauze.  Do this daily until the area heals.  You had skin glue applied to your left eyebrow laceration. Do not scratch, rub, or pick at the adhesive. Leave tissue adhesive in place. It will come off naturally after 7-10 days. Do not soak the area where there is tissue adhesive. Do not use any soaps, petroleum jelly products, or ointments on the wound. Certain ointments can weaken the adhesive.   You may take up to 1000mg  of tylenol every 6 hours as needed for pain. Do not take more then 4g per day.    Return to the ER should you develop fever, chills, pus drainage from your wound, redness around your wound.

## 2023-09-24 NOTE — ED Provider Notes (Signed)
[place] EMERGENCY DEPARTMENT AT [facility] Provider Note   CSN: [id number] Arrival date & time: 09/24/23  1058     History  Chief Complaint  Patient presents with   Gregory [name] is a 82 y.o. male not on anticoagulation who presents with concern for a fall earlier today.  States he was bringing a step ladder down stairs, and tripped and fell about 3 stairs. No prodromal dizziness, changes in vision, or palpitations. He fell and hit his head on the left side.  Denies any loss of consciousness, nausea or vomiting, changes in vision.  He reports pain right where he hit his head.  Also reports pain in his rib cage.  Reports abrasions to his right arm that he covered prior to arrival.   [facility] Medications Prior to Admission medications   Medication Sig Start Date End Date Taking? Authorizing Provider  acetaminophen (TYLENOL) 325 MG tablet Take 2 tablets (650 mg total) by mouth every 6 (six) hours as needed for mild pain (or Fever >/= 101). 01/04/23   Elgergawy, Leana Roe, MD  Ascorbic Acid (VITAMIN C) 1000 MG tablet Take 500 mg by mouth daily.    [provider]  Budeson-Glycopyrrol-Formoterol (BREZTRI AEROSPHERE) 160-9-4.8 MCG/ACT AERO Inhale 2 puffs into the lungs in the morning and at bedtime. Patient not taking: Reported on 08/30/2023 02/17/23   Hunsucker, Lesia Sago, MD  Budeson-Glycopyrrol-Formoterol (BREZTRI AEROSPHERE) 160-9-4.8 MCG/ACT AERO Inhale 2 puffs into the lungs in the morning and at bedtime. Patient not taking: Reported on 09/07/2023 07/07/23   Hunsucker, Lesia Sago, MD  Cholecalciferol 125 MCG (5000 UT) capsule Take 5,000 Units by mouth daily. Patient not taking: Reported on 09/07/2023    [provider]  docusate sodium (COLACE) 100 MG capsule Take 200 mg by mouth at bedtime. Patient not taking: Reported on 09/07/2023    [provider]  ezetimibe (ZETIA) 10 MG tablet Take 1 tablet (10 mg total) by mouth  daily. Patient not taking: Reported on 09/07/2023 09/06/23 12/05/23  Lewayne Bunting, MD  fluorometholone (FML) 0.1 % ophthalmic suspension Place 1 drop into both eyes 2 (two) times daily. Patient not taking: Reported on 09/07/2023 01/27/22   [provider]  gabapentin (NEURONTIN) 100 MG capsule TAKE 1 CAPSULE (100 MG TOTAL) BY MOUTH 2 (TWO) TIMES DAILY. 07/27/23   Tollie Eth, NP  Iron, Ferrous Sulfate, 325 (65 Fe) MG TABS Take 325 mg by mouth in the morning, at noon, and at bedtime. 08/23/21   Dorcas Carrow, MD  levocetirizine (XYZAL) 5 MG tablet Take 1 tablet (5 mg total) by mouth every evening. 06/27/18   Etta Grandchild, MD  levothyroxine (SYNTHROID) 150 MCG tablet Take 1 tablet (150 mcg total) by mouth daily before breakfast. 01/24/23   Early, Sung Amabile, NP  magic mouthwash (nystatin, hydrocortisone, diphenhydrAMINE) suspension Swish and swallow 5 mLs 3 (three) times daily as needed (mouth and throat irritation). Patient not taking: Reported on 09/07/2023 12/23/22   Tollie Eth, NP  Magnesium 250 MG TABS Take 250 mg by mouth at bedtime.     [provider]  meclizine (ANTIVERT) 25 MG tablet Take 1 tablet (25 mg total) by mouth 2 (two) times daily as needed for dizziness. 10/28/22   Tollie Eth, NP  methocarbamol (ROBAXIN) 500 MG tablet Take 1 tablet (500 mg total) by mouth every 8 (eight) hours as needed for muscle spasms. 10/26/21   Sanda Linger  L, MD  montelukast (SINGULAIR) 10 MG tablet TAKE 1 TABLET BY MOUTH AT  BEDTIME 05/30/23   Early, Sung Amabile, NP  Multiple Vitamin (MULTIVITAMIN WITH MINERALS) TABS tablet Take 1 tablet by mouth every morning.    [provider]  mupirocin ointment (BACTROBAN) 2 % Apply 1 Application topically 2 (two) times daily. To affected area Patient not taking: Reported on 09/07/2023 01/06/23   Noemi Chapel, NP  pantoprazole (PROTONIX) 40 MG tablet TAKE 1 TABLET BY MOUTH TWICE  DAILY 05/30/23   Early, Sung Amabile, NP  predniSONE (DELTASONE) 10 MG tablet  Take 2 tablets (20 mg total) by mouth daily with breakfast. Patient not taking: Reported on 09/07/2023 07/26/23   Tollie Eth, NP  RSV vaccine recomb adjuvanted (AREXVY) 120 MCG/0.5ML injection Inject 0.5 mLs into the muscle. Patient not taking: Reported on 09/07/2023 04/19/23     silodosin (RAPAFLO) 8 MG CAPS capsule Take 1 capsule (8 mg total) by mouth at bedtime. 07/04/23   Tollie Eth, NP  sodium chloride 1 g tablet Take 1 tablet (1 g total) by mouth 2 (two) times daily with a meal. 01/24/23   Early, Sung Amabile, NP  valsartan (DIOVAN) 40 MG tablet TAKE 1 TABLET BY MOUTH DAILY 07/25/23   Early, Sung Amabile, NP      Allergies    Nsaids, Other, Aspirin, Statins, Demeclocycline, Dust mite extract, and Tizanidine hcl    Review of Systems   Review of Systems  Skin:  Positive for wound.    Physical Exam Updated Vital Signs BP (!) 164/71 (BP Location: Left Arm)   Pulse 76   Temp 98.7 F (37.1 C) (Oral)   Resp 16   SpO2 100%  Physical Exam Vitals and nursing note reviewed.  Constitutional:      General: He is not in acute distress.    Appearance: He is well-developed.  HENT:     Head: Normocephalic.     Comments: 2 cm laceration to the outer edge of the left eyebrow, well-approximated, bleeding well-controlled.  No debris.  Mild surrounding ecchymosis Eyes:     Extraocular Movements: Extraocular movements intact.     Conjunctiva/sclera: Conjunctivae normal.     Pupils: Pupils are equal, round, and reactive to light.  Cardiovascular:     Rate and Rhythm: Normal rate and regular rhythm.     Heart sounds: No murmur heard. Pulmonary:     Effort: Pulmonary effort is normal. No respiratory distress.     Breath sounds: Normal breath sounds.  Abdominal:     Palpations: Abdomen is soft.     Tenderness: There is no abdominal tenderness.  Musculoskeletal:        General: No swelling.     Cervical back: Neck supple.     Comments: Able to ambulate without difficulty  Skin:    General: Skin is warm  and dry.     Capillary Refill: Capillary refill takes less than 2 seconds.     Comments: Several abrasions to the right forearm, bleeding well-controlled upon arrival.  Neurological:     Mental Status: He is alert.     Comments: Cranial nerves 3-12 intact  Psychiatric:        Mood and Affect: Mood normal.     ED Results / Procedures / Treatments   Labs (all labs ordered are listed, but only abnormal results are displayed) Labs Reviewed - No data to display  EKG None  Radiology CT Head Wo Contrast  Result Date: 09/24/2023  CLINICAL DATA:  Minor head trauma.  Mechanical fall EXAM: CT HEAD WITHOUT CONTRAST TECHNIQUE: Contiguous axial images were obtained from the base of the skull through the vertex without intravenous contrast. RADIATION DOSE REDUCTION: This exam was performed according to the departmental dose-optimization program which includes automated exposure control, adjustment of the mA and/or kV according to patient size and/or use of iterative reconstruction technique. COMPARISON:  05/29/2019 FINDINGS: Brain: No evidence of acute infarction, hemorrhage, hydrocephalus, extra-axial collection or mass lesion/mass effect. 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 Subtle low-density along prior left frontal burr hole which could have been for abscess drainage or ventriculostomy based on 2016 imaging. Vascular: No hyperdense vessel or unexpected calcification. Skull: Normal. Negative for fracture or focal lesion. Sinuses/Orbits: Clips at the left pterygoid palatine fossa. IMPRESSION: No evidence of injury Electronically Signed   By: Tiburcio Pea M.D.   On: 09/24/2023 11:54   DG Chest 2 View  Result Date: 09/24/2023 CLINICAL DATA:  Injury to chest wall. History of interstitial lung disease and [place] Weber Rendu EXAM: CHEST - 2 VIEW COMPARISON:  04/20/2023 x-ray.  CT scan 05/09/2023 FINDINGS: Hyperinflation with diffuse interstitial changes seen of the lungs. No pneumothorax,  effusion or edema. Normal cardiopericardial silhouette. No consolidation. Air-fluid level along the stomach beneath the left hemidiaphragm. Osteopenia. Curvature of the spine. If there is further concern of sequela of injury additional contrast CT scan of the chest could be considered as clinically appropriate IMPRESSION: Hyperinflation with chronic lung changes. Electronically Signed   By: Karen Kays M.D.   On: 09/24/2023 11:45    Procedures .Marland KitchenLaceration Repair  Date/Time: 09/24/2023 12:30 PM  Performed by: Arabella Merles, PA-C Authorized by: Arabella Merles, PA-C   Consent:    Consent obtained:  Verbal   Consent given by:  Patient   Risks, benefits, and alternatives were discussed: yes     Risks discussed:  Infection, pain and poor cosmetic result   Alternatives discussed:  No treatment Universal protocol:    Procedure explained and questions answered to patient or proxy's satisfaction: yes     Test results available: yes     Patient identity confirmed:  Verbally with patient Anesthesia:    Anesthesia method:  None Laceration details:    Location:  Face   Face location:  L eyebrow   Length (cm):  2 Pre-procedure details:    Preparation:  Patient was prepped and draped in usual sterile fashion and imaging obtained to evaluate for foreign bodies Exploration:    Imaging outcome: foreign body noted     Contaminated: no   Treatment:    Area cleansed with:  Soap and water   Amount of cleaning:  Standard Skin repair:    Repair method:  Tissue adhesive (Dermabond) Repair type:    Repair type:  Simple Post-procedure details:    Dressing:  Open (no dressing)   Procedure completion:  Tolerated well, no immediate complications     Medications Ordered in ED Medications  bacitracin ointment (1 Application Topical Given 09/24/23 1157)    ED Course/ Medical Decision Making/ A&P                                 Medical Decision Making Amount and/or Complexity of Data  Reviewed Radiology: ordered.  Risk OTC drugs.   82 y.o. male presents to the ED for concern of fall earlier today now with abrasions to the right arm, left  eyebrow, pain in his ribs  Differential diagnosis includes but is not limited to fracture, dislocation, intracranial hemorrhage, laceration, abrasions  ED Course:  Patient had a fall earlier today when going downstairs.  He tripped and fell, no prodromal symptoms.  He did hit his head but is not on any anticoagulation.  CT head without any signs of intracranial hemorrhage or skull fractures.  Laceration of the left eyebrow approximately 2 cm and well approximated.  This was cleaned with antiseptic solution and Dermabond applied by myself.  The abrasions to the right forearm did not need any repair.  Bacitracin ointment and nonstick dressing applied by the nurse.  Patient was warned that his chest x-ray showed no signs of rib fracture.  Patient appropriate for discharge home at this time   Impression: Mechanical fall Left eyebrow laceration Right forearm abrasions  Disposition:  The patient was discharged home with instructions to change the dressing of his right forearm daily.  Wash with soap and water and apply antibiotic ointment and cover with gauze as shown here.  Allow the Dermabond to fall off on its own. Return precautions given.  Imaging Studies ordered: I ordered imaging studies including x-ray chest, CT head I independently visualized the imaging with scope of interpretation limited to determining acute life threatening conditions related to emergency care. Chest x-ray with no signs of rib fracture CT head no acute abnormalities I agree with the radiologist interpretation             Final Clinical Impression(s) / ED Diagnoses Final diagnoses:  Fall (on) (from) other stairs and steps, initial encounter  Eyebrow laceration, left, initial encounter  Abrasion of right arm, initial encounter    Rx / DC  Orders ED Discharge Orders     None         Arabella Merles, PA-C 09/24/23 1239    Coral Spikes, DO 09/24/23 1513

## 2023-09-27 ENCOUNTER — Inpatient Hospital Stay: Payer: Medicare Other

## 2023-09-27 ENCOUNTER — Inpatient Hospital Stay: Payer: Medicare Other | Attending: Nurse Practitioner | Admitting: Oncology

## 2023-09-27 VITALS — BP 140/63 | HR 69 | Temp 98.1°F | Resp 18 | Ht 69.0 in | Wt 185.0 lb

## 2023-09-27 DIAGNOSIS — D500 Iron deficiency anemia secondary to blood loss (chronic): Secondary | ICD-10-CM

## 2023-09-27 DIAGNOSIS — D509 Iron deficiency anemia, unspecified: Secondary | ICD-10-CM | POA: Insufficient documentation

## 2023-09-27 LAB — CBC WITH DIFFERENTIAL (CANCER CENTER ONLY)
Abs Immature Granulocytes: 0.02 10*3/uL (ref 0.00–0.07)
Basophils Absolute: 0 10*3/uL (ref 0.0–0.1)
Basophils Relative: 1 %
Eosinophils Absolute: 0.1 10*3/uL (ref 0.0–0.5)
Eosinophils Relative: 1 %
HCT: 39.9 % (ref 39.0–52.0)
Hemoglobin: 13.8 g/dL (ref 13.0–17.0)
Immature Granulocytes: 0 %
Lymphocytes Relative: 37 %
Lymphs Abs: 2.6 10*3/uL (ref 0.7–4.0)
MCH: 30.9 pg (ref 26.0–34.0)
MCHC: 34.6 g/dL (ref 30.0–36.0)
MCV: 89.5 fL (ref 80.0–100.0)
Monocytes Absolute: 0.8 10*3/uL (ref 0.1–1.0)
Monocytes Relative: 11 %
Neutro Abs: 3.5 10*3/uL (ref 1.7–7.7)
Neutrophils Relative %: 50 %
Platelet Count: 292 10*3/uL (ref 150–400)
RBC: 4.46 MIL/uL (ref 4.22–5.81)
RDW: 13.4 % (ref 11.5–15.5)
WBC Count: 7.1 10*3/uL (ref 4.0–10.5)
nRBC: 0 % (ref 0.0–0.2)

## 2023-09-27 LAB — FERRITIN: Ferritin: 29 ng/mL (ref 24–336)

## 2023-09-27 NOTE — Progress Notes (Signed)
Gregory [name]   Diagnosis: Iron deficiency anemia  INTERVAL HISTORY:   Gregory [name] returns as scheduled.  He continues iron.  He had a recent fall resulting in a left periorbital laceration and emergency room visit.  He reports increased nosebleeds for the past few months.  He saw Gregory [name] last week.  A bleeding source was identified at the right anterior septum and was treated with silver nitrate.  Objective:  Vital signs in last 24 hours:  Blood pressure (!) 140/63, pulse 69, temperature 98.1 F (36.7 C), temperature source Temporal, resp. rate 18, height 5\' 9"  (1.753 m), weight 185 lb (83.9 kg), SpO2 93%.    HEENT: Multiple telangiectasias in the mouth Resp: Lungs clear bilaterally Cardio: Regular rate and rhythm GI: No hepatosplenomegaly Vascular: No leg edema  Skin: Ecchymoses surrounding the left eye, multiple telangiectasias   Lab Results:  Lab Results  Component Value Date   WBC 7.1 09/27/2023   HGB 13.8 09/27/2023   HCT 39.9 09/27/2023   MCV 89.5 09/27/2023   PLT 292 09/27/2023   NEUTROABS 3.5 09/27/2023    CMP  Lab Results  Component Value Date   NA 131 (L) 07/26/2023   K 4.7 07/26/2023   CL 93 (L) 07/26/2023   CO2 24 07/26/2023   GLUCOSE 79 07/26/2023   BUN 11 07/26/2023   CREATININE 1.01 07/26/2023   CALCIUM 10.0 07/26/2023   PROT 7.1 07/26/2023   ALBUMIN 4.6 07/26/2023   AST 17 07/26/2023   ALT 13 07/26/2023   ALKPHOS 71 07/26/2023   BILITOT 0.4 07/26/2023   GFRNONAA >60 04/20/2023   GFRAA >60 06/01/2020      Imaging:  CT Head Wo Contrast  Result Date: 09/24/2023 CLINICAL DATA:  Minor head trauma.  Mechanical fall EXAM: CT HEAD WITHOUT CONTRAST TECHNIQUE: Contiguous axial images were obtained from the base of the skull through the vertex without intravenous contrast. RADIATION DOSE REDUCTION: This exam was performed according to the departmental dose-optimization program which includes automated exposure  control, adjustment of the mA and/or kV according to patient size and/or use of iterative reconstruction technique. COMPARISON:  05/29/2019 FINDINGS: Brain: No evidence of acute infarction, hemorrhage, hydrocephalus, extra-axial collection or mass lesion/mass effect. 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 Subtle low-density along prior left frontal burr hole which could have been for abscess drainage or ventriculostomy based on 2016 imaging. Vascular: No hyperdense vessel or unexpected calcification. Skull: Normal. Negative for fracture or focal lesion. Sinuses/Orbits: Clips at the left pterygoid palatine fossa. IMPRESSION: No evidence of injury Electronically Signed   By: Tiburcio Pea M.D.   On: 09/24/2023 11:54   DG Chest 2 View  Result Date: 09/24/2023 CLINICAL DATA:  Injury to chest wall. History of interstitial lung disease and [place] Weber Rendu EXAM: CHEST - 2 VIEW COMPARISON:  04/20/2023 x-ray.  CT scan 05/09/2023 FINDINGS: Hyperinflation with diffuse interstitial changes seen of the lungs. No pneumothorax, effusion or edema. Normal cardiopericardial silhouette. No consolidation. Air-fluid level along the stomach beneath the left hemidiaphragm. Osteopenia. Curvature of the spine. If there is further concern of sequela of injury additional contrast CT scan of the chest could be considered as clinically appropriate IMPRESSION: Hyperinflation with chronic lung changes. Electronically Signed   By: Karen Kays M.D.   On: 09/24/2023 11:45    Medications: I have reviewed the patient's current medications.   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  The hemoglobin and ferritin are in the low normal range.  He will continue ferrous sulfate.  The drop in the hemoglobin over the past few months may be related to the recent fall and epistaxis.  He will call for symptoms of anemia.  Gregory [name] will return for a lab visit in 2 months in 4 months.  He will be scheduled for a 33-month office visit.  Gregory Papas, MD  09/27/2023  11:34 AM

## 2023-10-04 ENCOUNTER — Other Ambulatory Visit (HOSPITAL_BASED_OUTPATIENT_CLINIC_OR_DEPARTMENT_OTHER): Payer: Self-pay

## 2023-10-06 ENCOUNTER — Encounter: Payer: Self-pay | Admitting: Nurse Practitioner

## 2023-10-06 ENCOUNTER — Ambulatory Visit: Payer: Medicare Other | Admitting: Nurse Practitioner

## 2023-10-06 VITALS — BP 124/82 | HR 78 | Wt 184.8 lb

## 2023-10-06 DIAGNOSIS — Z043 Encounter for examination and observation following other accident: Secondary | ICD-10-CM

## 2023-10-06 DIAGNOSIS — S41111A Laceration without foreign body of right upper arm, initial encounter: Secondary | ICD-10-CM

## 2023-10-06 DIAGNOSIS — Z23 Encounter for immunization: Secondary | ICD-10-CM | POA: Diagnosis not present

## 2023-10-06 HISTORY — DX: Laceration without foreign body of right upper arm, initial encounter: S41.111A

## 2023-10-06 HISTORY — DX: Encounter for examination and observation following other accident: Z04.3

## 2023-10-06 MED ORDER — MUPIROCIN 2 % EX OINT
1.0000 | TOPICAL_OINTMENT | Freq: Two times a day (BID) | CUTANEOUS | 0 refills | Status: DC
Start: 2023-10-06 — End: 2024-07-23

## 2023-10-06 NOTE — Assessment & Plan Note (Signed)
Skin abrasions/lacerations and contusions present resulting from a fall approximately 2 weeks ago. All areas are healing well with no signs of infection. One area on the anterior forearm remains open more than the others with reports of bleeding with peroxide use. Discussion about healing time and dressings. He is due for a tetanus booster with his last one 10/04/2013, we have given that today. Silvadene applied to wounds and cotton 2x2s placed over each wound. Forearm wrapped in thin layer of kerlex to allow for air flow.  -Use silvadene provided on the wound at bedtime before applying new dressings.  - Use mupirocin on the wound in the morning. Apply dressings if you will leave the house, but otherwise, you can leave them open to the air to heal.  -Remove bandages in the shower and allow the water to rinse over them. Avoid scrubbing or specifically washing the areas with soap, but it is ok if this runs over the wound.  - Discontinue peroxide for now. You may use this on any remaining open wounds about every 2 days.  -Watch for any signs of infection and notify me immediately.

## 2023-10-06 NOTE — Patient Instructions (Addendum)
Leave the dressings off of the scrapes while at home during the day to let the air get to the areas to allow them to scab over.   Use Mupirocin in the morning - you can leave a bandage off after applying this if you will be home.  Use Silvadene (little cup) in the evening before bed and then apply the bandage.   This should heal up in the next 2 weeks, but if it starts to look like it is getting worse, not healing, or bleeding more then I want you to let me know. Call my cell phone if you need me.

## 2023-10-06 NOTE — Progress Notes (Signed)
Tollie Eth, DNP, AGNP-c [facility] [address] [place], [place] [address] [phone]   ACUTE VISIT- ESTABLISHED PATIENT  Blood pressure 124/82, pulse 78, weight 184 lb 12.8 oz (83.8 kg).  Subjective:  HPI Gregory [name] is a 82 y.o. male presents to day for evaluation of acute concern(s) related to a fall about 2 weeks ago. He was seen in the ED for this.   ED report shows Gregory [name] fell down 3 stairs while carrying a ladder. He did hit his head on the left side. No LOC or other concerning symptoms. He also hit his R rib cage and abrasion on the right arm.  CT Head Completed: IMPRESSION: No evidence of injury  CT Chest Completed: IMPRESSION: Hyperinflation with chronic lung changes.  Abrasion to left eye repaired with dermabond. Abrasions to right arm open.   History of Present Illness Gregory [name] presented with multiple skin abrasions and contusions following a fall down the stairs while carrying a ladder approximately two weeks prior to the consultation. The patient reported missing a step, leading to the fall and subsequent injuries. The most significant injury was a large, deep abrasion on the arm, which was initially managed with antibiotic ointment and non-stick pads. The patient reported that the wound was healing, but slowly, and had bled significantly on a few occasions.  The patient also reported bruised ribs from the fall but denied any fractures. A CT scan of the head was reportedly normal. The patient's spouse has been assisting with wound care, applying antibiotic ointment and dressing the wounds daily. The patient also reported using a peroxide spray on the wounds, which occasionally caused the wounds to bubble and bleed.  The patient has been attending physical therapy for balance issues and has a history of receiving B12 injections. The patient also reported a history of a nasal lesion, which was successfully treated with mupirocin. The patient has been  advised by family to avoid using ladders in the future due to the risk of falls.   ROS negative except for what is listed in HPI. History, Medications, Surgery, SDOH, and Family History reviewed and updated as appropriate.  Objective:  Physical Exam Vitals and nursing note reviewed.  Constitutional:      General: He is not in acute distress.    Appearance: Normal appearance. He is not ill-appearing.  HENT:     Head:   Skin:    General: Skin is warm and dry.     Capillary Refill: Capillary refill takes less than 2 seconds.     Findings: Abrasion, bruising and laceration present.       Neurological:     General: No focal deficit present.     Mental Status: He is alert and oriented to person, place, and time.     Cranial Nerves: No cranial nerve deficit.     Sensory: No sensory deficit.     Motor: No weakness.     Coordination: Coordination normal.     Gait: Gait normal.  Psychiatric:        Mood and Affect: Mood normal.        Behavior: Behavior normal.          Assessment & Plan:   Problem List Items Addressed This Visit     Encounter for examination following a fall - Primary    Skin abrasions/lacerations and contusions present resulting from a fall approximately 2 weeks ago. All areas are healing well with no signs of infection. One area on the  anterior forearm remains open more than the others with reports of bleeding with peroxide use. Discussion about healing time and dressings. He is due for a tetanus booster with his last one 10/04/2013, we have given that today. Silvadene applied to wounds and cotton 2x2s placed over each wound. Forearm wrapped in thin layer of kerlex to allow for air flow.  -Use silvadene provided on the wound at bedtime before applying new dressings.  - Use mupirocin on the wound in the morning. Apply dressings if you will leave the house, but otherwise, you can leave them open to the air to heal.  -Remove bandages in the shower and allow the  water to rinse over them. Avoid scrubbing or specifically washing the areas with soap, but it is ok if this runs over the wound.  - Discontinue peroxide for now. You may use this on any remaining open wounds about every 2 days.  -Watch for any signs of infection and notify me immediately.        Laceration of arm, right, multiple sites, initial encounter    Skin abrasions/lacerations and contusions present resulting from a fall approximately 2 weeks ago. All areas are healing well with no signs of infection. One area on the anterior forearm remains open more than the others with reports of bleeding with peroxide use. Discussion about healing time and dressings. He is due for a tetanus booster with his last one 10/04/2013, we have given that today. Silvadene applied to wounds and cotton 2x2s placed over each wound. Forearm wrapped in thin layer of kerlex to allow for air flow.  -Use silvadene provided on the wound at bedtime before applying new dressings.  - Use mupirocin on the wound in the morning. Apply dressings if you will leave the house, but otherwise, you can leave them open to the air to heal.  -Remove bandages in the shower and allow the water to rinse over them. Avoid scrubbing or specifically washing the areas with soap, but it is ok if this runs over the wound.  - Discontinue peroxide for now. You may use this on any remaining open wounds about every 2 days.  -Watch for any signs of infection and notify me immediately.        Other Visit Diagnoses     External nasal lesion       Relevant Medications   mupirocin ointment (BACTROBAN) 2 %   Need for Tdap vaccination       Relevant Orders   Tdap vaccine greater than or equal to 7yo IM (Completed)       Time: 31 minutes, >50% spent counseling, care coordination, chart review, and documentation.    Tollie Eth, DNP, AGNP-c

## 2023-10-10 ENCOUNTER — Ambulatory Visit (INDEPENDENT_AMBULATORY_CARE_PROVIDER_SITE_OTHER): Payer: Medicare Other | Admitting: Pulmonary Disease

## 2023-10-10 ENCOUNTER — Encounter: Payer: Self-pay | Admitting: Pulmonary Disease

## 2023-10-10 VITALS — BP 116/56 | HR 76 | Temp 97.6°F | Ht 70.0 in | Wt 186.0 lb

## 2023-10-10 DIAGNOSIS — J849 Interstitial pulmonary disease, unspecified: Secondary | ICD-10-CM

## 2023-10-10 DIAGNOSIS — J4530 Mild persistent asthma, uncomplicated: Secondary | ICD-10-CM | POA: Diagnosis not present

## 2023-10-10 MED ORDER — BUDESONIDE 0.5 MG/2ML IN SUSP: 0.5000 mg | Freq: Every day | RESPIRATORY_TRACT | 11 refills | Status: DC

## 2023-10-10 NOTE — Patient Instructions (Signed)
Nice to see you again  The cough at night could be related to something like irritation after RSV or inflammation that persists  Also, it could relate to the mild scarring or fibrosis we see on the lung.  To try to help with the cough that can be related to irritation or inflammation after RSV, use budesonide nebulized in the evening  Let see if this helps the cough at night  Paperwork today for Ofev, the medication to try to slow down any scarring in the lung  Return to clinic in 3 months or sooner as needed with Dr. Judeth Horn

## 2023-10-10 NOTE — Progress Notes (Signed)
@Patient  ID: Gregory [name], male    DOB: 21-Nov-1941, 82 y.o.   MRN: [id number]  Chief Complaint  Patient presents with   Follow-up    Cough x 6 months.  Physical therapy causing SaO2 to decrease to low 80's    Referring provider: Tollie Eth, NP  HPI:   82 y.o. 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   Overall doing okay.  Still with cough at night.  Using nebulized albuterol occasionally with some improvement.  We discussed Ofev in the past.  Paperwork for enrollment was provided I believe.  Remember some paperwork.  But I still see you never sent in.  We again discussed role and rationale for movement based on mild fibrosis not in UIP pattern on CT scan.  Slightly progressive over time.  He agrees to move forward with this.  HPI at initial visit: Patient has chronic cough over the last 2 years.  When asked in more detail he admits cough is been present for well over a decade.  Unclear how much change there has been since last seen by pulmonary some years ago.  Does seem that the frequency has worsened over the last couple of years.  Worse at night but occurs throughout the day.  It is nonproductive.  Uses a variety of dextromethorphan containing products that do help, he rotates through these 2 or 3 products as they seem to wear off and affect.  He is currently on Breo does not seem to help.  Recently resumed gabapentin at low-dose, 100 mg twice daily with mild improvement.  Notes in the past he was on 200 mg twice daily of gabapentin with significant improvement however he had syncope in the setting of what sounds like polypharmacy.  Prior pulmonary notes indicate that he is cough improved on Dulera.  He identifies no other alleviating or exacerbating factors.  No environmental or seasonal factors that contribute to better or worse cough.  No position where things are better or worse.  He is on a PPI twice daily for  a long history of GERD.  Does endorse occasional breakthrough symptoms of heartburn, reflux.  Reports endoscopy, manometry, pH probe in the past although these results are all available for review and he does not recall the results.  Reviewed serial CT scans dating back to 2016, with persistent right lower lobe large AVM and small left lower lobe AVM stable from scan 04/2015, 09/2018, 11/2018, 11/2020 with otherwise clear lungs on my interpretation.  PMH: HHT with pulmonary AVMs, hypertension, GERD Surgical history: Brain surgery for brain abscess, Family history: Mother with diabetes, hypertension, father with kidney failure Social history: Lives in [place], retired, went to [employer], has 3 grandchildren  Public house manager / Pulmonary Flowsheets:   ACT:      No data to display          MMRC:     No data to display          Epworth:      No data to display          Tests:   FENO:  No results found for: "NITRICOXIDE"  PFT:    Latest Ref Rng & Units 07/31/2021   10:49 AM  PFT Results  FVC-Pre L 2.90   FVC-Predicted Pre % 72   FVC-Post L 2.81   FVC-Predicted Post % 69   Pre FEV1/FVC % % 81  Post FEV1/FCV % % 84   FEV1-Pre L 2.34   FEV1-Predicted Pre % 81   FEV1-Post L 2.38   DLCO uncorrected ml/min/mmHg 10.47   DLCO UNC% % 43   DLCO corrected ml/min/mmHg 10.47   DLCO COR %Predicted % 43   DLVA Predicted % 57   TLC L 5.07   TLC % Predicted % 72   RV % Predicted % 85   Personally reviewed and interpreted as normal spirometry, mildly reduced TLC/mild restriction, severely reduced DLCO.  WALK:     04/18/2023    2:16 PM 01/20/2023   11:45 AM 01/06/2023   11:53 AM  SIX MIN WALK  Supplimental Oxygen during Test? (L/min) No Yes Yes  O2 Flow Rate  3 L/min 5 L/min  Type  Continuous Continuous  Tech Comments: patient ambulated on RA at a moderate, steady pace with no stops, no complaints of sob. Patient O2 sats monitored on head probe during walk.  rest and lap  #1 were on room air, provided 5L O2 and sats went up   Imaging: Personally reviewed and as per EMR  Lab Results: Personally reviewed CBC    Component Value Date/Time   WBC 7.1 09/27/2023 1025   WBC 11.9 (H) 04/20/2023 0245   RBC 4.46 09/27/2023 1025   HGB 13.8 09/27/2023 1025   HGB 15.6 07/26/2023 1133   HCT 39.9 09/27/2023 1025   HCT 44.6 07/26/2023 1133   PLT 292 09/27/2023 1025   PLT 278 07/26/2023 1133   MCV 89.5 09/27/2023 1025   MCV 92 07/26/2023 1133   MCH 30.9 09/27/2023 1025   MCHC 34.6 09/27/2023 1025   RDW 13.4 09/27/2023 1025   RDW 12.8 07/26/2023 1133   LYMPHSABS 2.6 09/27/2023 1025   LYMPHSABS 2.9 07/26/2023 1133   MONOABS 0.8 09/27/2023 1025   EOSABS 0.1 09/27/2023 1025   EOSABS 0.0 07/26/2023 1133   BASOSABS 0.0 09/27/2023 1025   BASOSABS 0.0 07/26/2023 1133    BMET    Component Value Date/Time   NA 131 (L) 07/26/2023 1133   K 4.7 07/26/2023 1133   CL 93 (L) 07/26/2023 1133   CO2 24 07/26/2023 1133   GLUCOSE 79 07/26/2023 1133   GLUCOSE 118 (H) 04/20/2023 0245   BUN 11 07/26/2023 1133   CREATININE 1.01 07/26/2023 1133   CALCIUM 10.0 07/26/2023 1133   GFRNONAA >60 04/20/2023 0245   GFRAA >60 06/01/2020 1106    BNP    Component Value Date/Time   BNP 37.1 12/30/2022 0548    ProBNP    Component Value Date/Time   PROBNP 126.0 (H) 03/19/2021 0959    Specialty Problems       Pulmonary Problems   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   Seasonal allergic rhinitis due to pollen   Laryngopharyngeal reflux (LPR)   Interstitial lung disease (HCC)   On home O2    Allergies  Allergen Reactions   Nsaids Other (See Comments)    Cannot take-per MD   Other Other (See Comments)    All blood thinners-cannot take per MD   Aspirin Other (See Comments)    nosebleeds   Statins Other (See Comments)    Weakness, myalgias   Demeclocycline Other (See Comments)    Passed out   Dust Mite Extract Other (See Comments)    Tizanidine Hcl Other (See Comments)    Patient stated that after taking this medication he experienced feelings of dizziness and disorientation.     Immunization History  Administered Date(s)  Administered   Fluad Quad(high Dose 65+) 08/24/2019, 10/02/2020, 09/11/2021   Fluad Trivalent(High Dose 65+) 09/13/2023   Influenza Split 09/21/2011, 09/27/2012   Influenza Whole 09/11/2008, 09/11/2009, 09/22/2010   Influenza, High Dose Seasonal PF 09/09/2016, 09/16/2017, 10/03/2018   Influenza, Quadrivalent, Recombinant, Inj, Pf 08/20/2022   Influenza,inj,Quad PF,6+ Mos 10/04/2013, 10/08/2014, 10/02/2015   PFIZER Comirnaty(Gray Top)Covid-19 Tri-Sucrose Vaccine 06/08/2021   PFIZER(Purple Top)SARS-COV-2 Vaccination 01/03/2020, 01/23/2020, 10/18/2020   PNEUMOCOCCAL CONJUGATE-20 04/26/2022   Pfizer Covid-19 Vaccine Bivalent Booster 53yrs & up 10/21/2021   Pfizer(Comirnaty)Fall Seasonal Vaccine 12 years and older 10/06/2022, 08/30/2023   Pneumococcal Conjugate-13 12/07/2016   Pneumococcal Polysaccharide-23 03/12/2010, 06/27/2018   Respiratory Syncytial Virus Vaccine,Recomb Aduvanted(Arexvy) 04/19/2023   Tdap 10/04/2013, 10/06/2023   Varicella 12/20/2009   Zoster Recombinant(Shingrix) 06/24/2022, 10/25/2022   Zoster, Live 09/06/2022    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  WITHOUT HEMOPTYSIS   History of nonmelanoma skin cancer EXCISION SQUAMOUS CELL FROM HAND   Hospital discharge follow-up 01/18/2023   Hypertrophy of prostate  with urinary obstruction and other lower urinary tract symptoms (LUTS)    ILD (interstitial lung disease) (HCC)    Irritable bowel syndrome    Nocturnal leg cramps 06/11/2013   Pain in both lower extremities 10/28/2022   Plantar fascial fibromatosis    Pressure ulcer caused by device 01/07/2023   Pure hypercholesterolemia    RSV (respiratory syncytial virus pneumonia) 01/07/2023   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  Added automatically from request for surgery [id number]   Vaccine counseling 01/24/2023    Tobacco History: Social History   Tobacco Use  Smoking Status Never  Smokeless Tobacco Never   Counseling given: Not Answered   Continue to not smoke  Outpatient Encounter Medications as of 10/10/2023  Medication Sig   acetaminophen (TYLENOL) 325 MG tablet Take 2 tablets (650 mg total) by mouth every 6 (six) hours as needed for mild pain (or Fever >/= 101).   albuterol (PROVENTIL) (2.5 MG/3ML) 0.083% nebulizer solution Take 2.5 mg by nebulization every 6 (six) hours as needed.   Ascorbic Acid (VITAMIN C) 1000 MG tablet Take 500 mg by mouth daily.   budesonide (PULMICORT) 0.5 MG/2ML nebulizer solution Take 2 mLs (0.5 mg total) by nebulization at bedtime.   Cholecalciferol 125 MCG (5000 UT) capsule Take 5,000 Units by mouth daily.   docusate sodium (COLACE) 100 MG capsule Take 200 mg by mouth at bedtime.   ezetimibe (ZETIA) 10 MG tablet Take 1 tablet (10 mg total) by mouth daily.   gabapentin (NEURONTIN) 100 MG capsule TAKE 1 CAPSULE (100 MG TOTAL) BY MOUTH 2 (TWO) TIMES DAILY.    Iron, Ferrous  Sulfate, 325 (65 Fe) MG TABS Take 325 mg by mouth in the morning, at noon, and at bedtime.   levocetirizine (XYZAL) 5 MG tablet Take 1 tablet (5 mg total) by mouth every evening.   levothyroxine (SYNTHROID) 150 MCG tablet Take 1 tablet (150 mcg total) by mouth daily before breakfast.   magic mouthwash (nystatin, hydrocortisone, diphenhydrAMINE) suspension Swish and swallow 5 mLs 3 (three) times daily as needed (mouth and throat irritation).   Magnesium 250 MG TABS Take 250 mg by mouth at bedtime.    meclizine (ANTIVERT) 25 MG tablet Take 1 tablet (25 mg total) by mouth 2 (two) times daily as needed for dizziness.   methocarbamol (ROBAXIN) 500 MG tablet Take 1 tablet (500 mg total) by mouth every 8 (eight) hours as needed for muscle spasms.   montelukast (SINGULAIR) 10 MG tablet TAKE 1 TABLET BY MOUTH AT  BEDTIME   Multiple Vitamin (MULTIVITAMIN WITH MINERALS) TABS tablet Take 1 tablet by mouth every morning.   mupirocin ointment (BACTROBAN) 2 % Apply 1 Application topically 2 (two) times daily. To affected area. Patient will call when ready to pick up.   pantoprazole (PROTONIX) 40 MG tablet TAKE 1 TABLET BY MOUTH TWICE  DAILY   silodosin (RAPAFLO) 8 MG CAPS capsule Take 1 capsule (8 mg total) by mouth at bedtime.   sodium chloride 1 g tablet Take 1 tablet (1 g total) by mouth 2 (two) times daily with a meal.   valsartan (DIOVAN) 40 MG tablet TAKE 1 TABLET BY MOUTH DAILY   No facility-administered encounter medications on file as of 10/10/2023.     Review of Systems  Review of Systems  N/a Physical Exam  BP (!) 116/56 (BP Location: Left Arm, Patient Position: Sitting, Cuff Size: Normal)   Pulse 76   Temp 97.6 F (36.4 C) (Oral)   Ht 5\' 10"  (1.778 m)   Wt 186 lb (84.4 kg)   SpO2 92%   BMI 26.69 kg/m   Wt Readings from Last 5 Encounters:  10/10/23 186 lb (84.4 kg)  10/06/23 184 lb 12.8 oz (83.8 kg)  09/27/23 185 lb (83.9 kg)  09/07/23 185 lb (83.9 kg)  08/30/23 186  lb (84.4 kg)    BMI Readings from Last 5 Encounters:  10/10/23 26.69 kg/m  10/06/23 27.29 kg/m  09/27/23 27.32 kg/m  09/07/23 27.32 kg/m  08/30/23 27.47 kg/m     Physical Exam General: Well-appearing, in no acute distress Eyes: EOMI, no icterus Neck: Supple no JVP Cardiovascular: Regular in rhythm, no murmurs Pulmonary: Clear to auscultation bilaterally, no wheezing Abdomen: Nondistended, bowel sounds present MSK: No synovitis, no joint effusion Neuro: Normal gait, no weakness Psych: Normal mood, full affect   Assessment & Plan:   Chronic Cough: Likely multifactorial. Improved with Dulera in past although more recently Breo did not help. Begs question of cough variant asthma.  Did not improve with high-dose Dulera or low-dose gabapentin.  Has improved historically with prolonged steroid therapy.  Trial nebulized budesonide at night given nocturnal cough major issue.  See if it helps, suspect residual inflammation or bronchitis-like symptoms after significant RSV infection.  DOE: Likely multifactorial.  Worsening in the summer 2022 related to anemia, hemoglobin found to be in 6s.  Likely chronic GI losses in the setting of his AVM.  On iron supplementation and improving.  Likely some contribution from mild but stable signs of interstitial lung disease on CT chest.  Worsened after RSV pneumonia, now improving again.  Pulmonary AVMs: Repeat CT  may 2024 showed stable AVMs.  ILD: Mild on imaging.  Relatively stable.  Given mild fibrosis discussed risk and benefits of antifibrotic's.  Agreed to start Ofev.  Lab work, LFTs reassuring.  Paperwork for Ofev repeated today, prior paperwork to either lost or not turned in.  Discussed side effect profile.   Return in about 3 months (around 01/10/2024).   Karren Burly, MD 10/10/2023

## 2023-10-12 ENCOUNTER — Other Ambulatory Visit (HOSPITAL_COMMUNITY): Payer: Self-pay

## 2023-10-12 ENCOUNTER — Telehealth: Payer: Self-pay | Admitting: Pharmacy Technician

## 2023-10-12 NOTE — Telephone Encounter (Signed)
Patient Advocate Encounter  Prior Authorization for Ofev 100mg  has been approved with [employer].   PA# [id number] Key: [id number] Effective dates: 10/12/23 through 12/19/24.  Ran test claim, copay is $3081.44 for 30 day supply. Income on application appears to exceed program guidelines

## 2023-10-12 NOTE — Telephone Encounter (Signed)
Received Ofev new start paperwork.  Submitted a Prior Authorization request to [facility] for OFEV via CoverMyMeds. Will update once we receive a response.  Key: [id number]

## 2023-10-13 ENCOUNTER — Telehealth: Payer: Self-pay | Admitting: Pulmonary Disease

## 2023-10-13 NOTE — Telephone Encounter (Signed)
Any ideas for cost? Ok if not.

## 2023-10-13 NOTE — Telephone Encounter (Signed)
OFEV is too expensive a co-pay so he wanted Dr. To know he would not be able to afford this. (See last encounter that is signed.)

## 2023-10-13 NOTE — Telephone Encounter (Signed)
Spoke to patient. He wanted to make Dr. Judeth Horn aware that he can not afford ofev and he did not qualify for patient assistance.

## 2023-10-14 NOTE — Telephone Encounter (Signed)
Called patient to discuss Ofev benefits. Discussed changes to Medicare next year with OOP max being $2000 and option to enrolled into Prescription Payment Plan that maxes monthly copay to $167 per month.   We discussed that Ofev works to slow disease progression but is not curative. Additionally, Ofev is not 100% effective for all patients. HRCt and PFTs are monitoring for disease progression. Reviewed that Ofev will not manage some of the symptoms that present once disease has progressed but goal is to delay onset of these symptoms altogether. He verbalized understanding.  At this time, he'd like to defer on starting Ofev and monitor disease progression.  Chesley Mires, PharmD, MPH, BCPS, CPP Clinical Pharmacist (Rheumatology and Pulmonology)

## 2023-10-14 NOTE — Telephone Encounter (Signed)
Thanks for reaching out and for the help - will re-address at next visit.

## 2023-10-24 ENCOUNTER — Telehealth: Payer: Self-pay | Admitting: Nurse Practitioner

## 2023-10-24 NOTE — Telephone Encounter (Signed)
[employer] [facility] left message that Levothyroxine has manufacture change from M[employer] to Alvagen & they need approval [phone] ref # [id number]

## 2023-10-25 ENCOUNTER — Encounter: Payer: Self-pay | Admitting: Nurse Practitioner

## 2023-10-25 ENCOUNTER — Ambulatory Visit (INDEPENDENT_AMBULATORY_CARE_PROVIDER_SITE_OTHER): Payer: Medicare Other | Admitting: Nurse Practitioner

## 2023-10-25 VITALS — BP 130/82 | HR 68 | Wt 187.4 lb

## 2023-10-25 DIAGNOSIS — S41111D Laceration without foreign body of right upper arm, subsequent encounter: Secondary | ICD-10-CM

## 2023-10-25 NOTE — Telephone Encounter (Signed)
[employer] informed.

## 2023-10-25 NOTE — Progress Notes (Unsigned)
  Tollie Eth, DNP, AGNP-c [facility] [address] [place], [place] [address] [phone]   ACUTE VISIT- ESTABLISHED PATIENT  Blood pressure 130/82, pulse 68, weight 187 lb 6.4 oz (85 kg), SpO2 93%.  Subjective:  HPI Gregory [name] is a 82 y.o. male presents to day for evaluation of acute concern(s).   Gregory [name] presents today for re-evaluation of wounds on his right arm which occurred following a fall about a month ago. He reports no pain or issues with sensation or use of the arm. He has noted some slight redness around the areas that were open and this concerned him. He reports no drainage, warmth, or red streaks from the area.   He is still experiencing some tenderness to the left lower ribs from his fall, but this is primarily only when he lays down at night and puts pressure on the area.    ROS negative except for what is listed in HPI. History, Medications, Surgery, SDOH, and Family History reviewed and updated as appropriate.  Objective:  Physical Exam Vitals and nursing note reviewed.  Constitutional:      General: He is not in acute distress.    Appearance: Normal appearance. He is not ill-appearing.  HENT:     Head: Normocephalic.  Eyes:     Conjunctiva/sclera: Conjunctivae normal.  Cardiovascular:     Rate and Rhythm: Normal rate and regular rhythm.     Pulses: Normal pulses.     Heart sounds: Normal heart sounds.  Pulmonary:     Effort: Pulmonary effort is normal.     Breath sounds: Normal breath sounds.  Skin:    General: Skin is warm and dry.     Capillary Refill: Capillary refill takes less than 2 seconds.          Comments: Well healed wounds with slight erythema with no signs of drainage, warmth, or tenderness.   Neurological:     Mental Status: He is alert and oriented to person, place, and time.  Psychiatric:        Mood and Affect: Mood normal.        Behavior: Behavior normal.         Assessment & Plan:   Problem List  Items Addressed This Visit     Laceration of arm, right, multiple sites, initial encounter - Primary      Tollie Eth, DNP, AGNP-c

## 2023-10-26 NOTE — Assessment & Plan Note (Signed)
Lacerations healing well with no signs of infection or delayed healing. All wound edges well approximated. Dark pink appearance to the healing edges consistent with normal healing process. Patient reassured that these appear normal. No further follow-up required. Encouraged him to continue to monitor closely and avoid bumping the areas as they are still fragile and could easily re-open.

## 2023-10-28 ENCOUNTER — Other Ambulatory Visit: Payer: Medicare Other

## 2023-10-29 LAB — LIPID PANEL
Chol/HDL Ratio: 2.9 {ratio} (ref 0.0–5.0)
Cholesterol, Total: 116 mg/dL (ref 100–199)
HDL: 40 mg/dL (ref >39)
LDL Chol Calc (NIH): 62 mg/dL (ref 0–99)
Triglycerides: 68 mg/dL (ref 0–149)
VLDL Cholesterol Cal: 14 mg/dL (ref 5–40)

## 2023-10-29 LAB — HEPATIC FUNCTION PANEL
ALT: 13 [IU]/L (ref 0–44)
AST: 16 [IU]/L (ref 0–40)
Albumin: 4.2 g/dL (ref 3.7–4.7)
Alkaline Phosphatase: 81 [IU]/L (ref 44–121)
Bilirubin Total: 0.5 mg/dL (ref 0.0–1.2)
Bilirubin, Direct: 0.18 mg/dL (ref 0.00–0.40)
Total Protein: 6.3 g/dL (ref 6.0–8.5)

## 2023-11-03 ENCOUNTER — Other Ambulatory Visit: Payer: Self-pay

## 2023-11-03 ENCOUNTER — Emergency Department (HOSPITAL_BASED_OUTPATIENT_CLINIC_OR_DEPARTMENT_OTHER)
Admission: EM | Admit: 2023-11-03 | Discharge: 2023-11-03 | Disposition: A | Discharge status: Home / Self Care | Payer: Medicare Other | Attending: Emergency Medicine | Admitting: Emergency Medicine

## 2023-11-03 DIAGNOSIS — S01511A Laceration without foreign body of lip, initial encounter: Secondary | ICD-10-CM | POA: Insufficient documentation

## 2023-11-03 DIAGNOSIS — Y9389 Activity, other specified: Secondary | ICD-10-CM | POA: Insufficient documentation

## 2023-11-03 DIAGNOSIS — W268XXA Contact with other sharp object(s), not elsewhere classified, initial encounter: Secondary | ICD-10-CM | POA: Diagnosis not present

## 2023-11-03 MED ORDER — LIDOCAINE-EPINEPHRINE (PF) 2 %-1:200000 IJ SOLN
INTRAMUSCULAR | Status: AC
  Administered 2023-11-03: 20 mL
  Filled 2023-11-03: qty 20

## 2023-11-03 MED ORDER — LIDOCAINE-EPINEPHRINE 2 %-1:100000 IJ SOLN: 20.0000 mL | Freq: Once | INTRAMUSCULAR | Status: DC

## 2023-11-03 MED ORDER — LIDOCAINE-EPINEPHRINE (PF) 2 %-1:200000 IJ SOLN
20.0000 mL | Freq: Once | INTRAMUSCULAR | Status: AC
  Administered 2023-11-03: 20 mL

## 2023-11-03 NOTE — ED Provider Notes (Signed)
[place] EMERGENCY DEPARTMENT AT [facility] Provider Note   CSN: [id number] Arrival date & time: 11/03/23  1435     History  Chief Complaint  Patient presents with   Lip Laceration    Gregory [name] is a 82 y.o. male.  82 yo M with a cc of bleeding from his lip.  The patient cut himself while shaving.  He unfortunately has a known genetic disorder where his veins are closer to the surface and caused him to have significant bleeding.  Typically he is able to get it controlled on his own with pressure but has had difficulty with this today and thus sought care.        Home Medications Prior to Admission medications   Medication Sig Start Date End Date Taking? Authorizing Provider  acetaminophen (TYLENOL) 325 MG tablet Take 2 tablets (650 mg total) by mouth every 6 (six) hours as needed for mild pain (or Fever >/= 101). 01/04/23   Elgergawy, Leana Roe, MD  albuterol (PROVENTIL) (2.5 MG/3ML) 0.083% nebulizer solution Take 2.5 mg by nebulization every 6 (six) hours as needed. 05/19/23   [provider]  Ascorbic Acid (VITAMIN C) 1000 MG tablet Take 500 mg by mouth daily.    [provider]  budesonide (PULMICORT) 0.5 MG/2ML nebulizer solution Take 2 mLs (0.5 mg total) by nebulization at bedtime. 10/10/23   Hunsucker, Lesia Sago, MD  Cholecalciferol 125 MCG (5000 UT) capsule Take 5,000 Units by mouth daily.    [provider]  docusate sodium (COLACE) 100 MG capsule Take 200 mg by mouth at bedtime.    [provider]  ezetimibe (ZETIA) 10 MG tablet Take 1 tablet (10 mg total) by mouth daily. 09/06/23 12/05/23  Lewayne Bunting, MD  gabapentin (NEURONTIN) 100 MG capsule TAKE 1 CAPSULE (100 MG TOTAL) BY MOUTH 2 (TWO) TIMES DAILY. 07/27/23   Tollie Eth, NP  Iron, Ferrous Sulfate, 325 (65 Fe) MG TABS Take 325 mg by mouth in the morning, at noon, and at bedtime. 08/23/21   Dorcas Carrow, MD  levocetirizine (XYZAL) 5 MG tablet Take 1 tablet (5 mg  total) by mouth every evening. 06/27/18   Etta Grandchild, MD  levothyroxine (SYNTHROID) 150 MCG tablet Take 1 tablet (150 mcg total) by mouth daily before breakfast. 01/24/23   Early, Sung Amabile, NP  magic mouthwash (nystatin, hydrocortisone, diphenhydrAMINE) suspension Swish and swallow 5 mLs 3 (three) times daily as needed (mouth and throat irritation). Patient not taking: Reported on 10/25/2023 12/23/22   Tollie Eth, NP  Magnesium 250 MG TABS Take 250 mg by mouth at bedtime.     [provider]  meclizine (ANTIVERT) 25 MG tablet Take 1 tablet (25 mg total) by mouth 2 (two) times daily as needed for dizziness. 10/28/22   Tollie Eth, NP  methocarbamol (ROBAXIN) 500 MG tablet Take 1 tablet (500 mg total) by mouth every 8 (eight) hours as needed for muscle spasms. 10/26/21   Etta Grandchild, MD  montelukast (SINGULAIR) 10 MG tablet TAKE 1 TABLET BY MOUTH AT  BEDTIME 05/30/23   Early, Sung Amabile, NP  Multiple Vitamin (MULTIVITAMIN WITH MINERALS) TABS tablet Take 1 tablet by mouth every morning.    [provider]  mupirocin ointment (BACTROBAN) 2 % Apply 1 Application topically 2 (two) times daily. To affected area. Patient will call when ready to pick up. 10/06/23   Early, Sung Amabile, NP  pantoprazole (PROTONIX) 40 MG tablet TAKE 1 TABLET BY  MOUTH TWICE  DAILY 05/30/23   Early, Sung Amabile, NP  silodosin (RAPAFLO) 8 MG CAPS capsule Take 1 capsule (8 mg total) by mouth at bedtime. 07/04/23   Tollie Eth, NP  sodium chloride 1 g tablet Take 1 tablet (1 g total) by mouth 2 (two) times daily with a meal. 01/24/23   Early, Sung Amabile, NP  valsartan (DIOVAN) 40 MG tablet TAKE 1 TABLET BY MOUTH DAILY 07/25/23   Early, Sung Amabile, NP      Allergies    Nsaids, Other, Aspirin, Statins, Demeclocycline, Dust mite extract, and Tizanidine hcl    Review of Systems   Review of Systems  Physical Exam Updated Vital Signs BP (!) 150/77 (BP Location: Right Arm)   Pulse 75   Temp 97.8 F (36.6 C)   Resp 16   SpO2 92%   Physical Exam Vitals and nursing note reviewed.  Constitutional:      Appearance: He is well-developed.  HENT:     Head: Normocephalic and atraumatic.     Mouth/Throat:     Comments: Small laceration just at the vermilion border about the right lower lip.  Venous oozing. Eyes:     Pupils: Pupils are equal, round, and reactive to light.  Neck:     Vascular: No JVD.  Cardiovascular:     Rate and Rhythm: Normal rate and regular rhythm.     Heart sounds: No murmur heard.    No friction rub. No gallop.  Pulmonary:     Effort: No respiratory distress.     Breath sounds: No wheezing.  Abdominal:     General: There is no distension.     Tenderness: There is no abdominal tenderness. There is no guarding or rebound.  Musculoskeletal:        General: Normal range of motion.     Cervical back: Normal range of motion and neck supple.  Skin:    Coloration: Skin is not pale.     Findings: No rash.  Neurological:     Mental Status: He is alert and oriented to person, place, and time.  Psychiatric:        Behavior: Behavior normal.     ED Results / Procedures / Treatments   Labs (all labs ordered are listed, but only abnormal results are displayed) Labs Reviewed - No data to display  EKG None  Radiology No results found.  Procedures .Marland KitchenLaceration Repair  Date/Time: 11/03/2023 3:26 PM  Performed by: Melene Plan, DO Authorized by: Melene Plan, DO   Consent:    Consent obtained:  Verbal   Consent given by:  Patient   Risks, benefits, and alternatives were discussed: yes     Risks discussed:  Infection and pain   Alternatives discussed:  No treatment Universal protocol:    Procedure explained and questions answered to patient or proxy's satisfaction: yes     Patient identity confirmed:  Verbally with patient Anesthesia:    Anesthesia method:  None Laceration details:    Location:  Lip   Lip location:  Lower exterior lip   Length (cm):  0.1 Pre-procedure details:     Preparation:  Patient was prepped and draped in usual sterile fashion Exploration:    Limited defect created (wound extended): no     Hemostasis achieved with:  Cautery Repair type:    Repair type:  Simple Post-procedure details:    Dressing:  Open (no dressing)   Procedure completion:  Tolerated well, no immediate complications  Medications Ordered in ED Medications  lidocaine-EPINEPHrine (XYLOCAINE W/EPI) 2 %-1:200000 (PF) injection (has no administration in time range)  lidocaine-EPINEPHrine (XYLOCAINE W/EPI) 2 %-1:200000 (PF) injection 20 mL (has no administration in time range)    ED Course/ Medical Decision Making/ A&P                                 Medical Decision Making Risk Prescription drug management.   81 year old male with a chief complaints of a cut to his lip that will not stop bleeding.  Cautery and direct pressure at bedside with some improvement.  On record review patient with history of congenital telangiectasia.  Patient observed in the ED for short period of time.  No recurrent bleeding.  Will discharge home.  3:38 PM:  I have discussed the diagnosis/risks/treatment options with the patient and family.  Evaluation and diagnostic testing in the emergency department does not suggest an emergent condition requiring admission or immediate intervention beyond what has been performed at this time.  They will follow up with PCP. We also discussed returning to the ED immediately if new or worsening sx occur. We discussed the sx which are most concerning (e.g., sudden worsening pain, fever, inability to tolerate by mouth, bleeding not controlled with direct pressure) that necessitate immediate return. Medications administered to the patient during their visit and any new prescriptions provided to the patient are listed below.  Medications given during this visit Medications  lidocaine-EPINEPHrine (XYLOCAINE W/EPI) 2 %-1:200000 (PF) injection (has no  administration in time range)  lidocaine-EPINEPHrine (XYLOCAINE W/EPI) 2 %-1:200000 (PF) injection 20 mL (has no administration in time range)     The patient appears reasonably screen and/or stabilized for discharge and I doubt any other medical condition or other [facility] requiring further screening, evaluation, or treatment in the ED at this time prior to discharge.          Final Clinical Impression(s) / ED Diagnoses Final diagnoses:  Lip laceration, initial encounter    Rx / DC Orders ED Discharge Orders     None         Melene Plan, DO 11/03/23 1538

## 2023-11-03 NOTE — ED Triage Notes (Signed)
Cut right lip shaving. ~3-29mm cut to lip. Bleeding since 1200. Olser Weber pt with increased bleeding risk. No thinners. Sterile gauze applied.

## 2023-11-03 NOTE — Discharge Instructions (Signed)
Direct pressure for 15 min without stopping usually will get all bleeding to stop.  Please return if this does not work.  Follow up with your doctor in the office.

## 2023-11-24 ENCOUNTER — Telehealth: Payer: Self-pay | Admitting: *Deleted

## 2023-11-24 NOTE — Telephone Encounter (Signed)
Transition Care Management Unsuccessful Follow-up Telephone Call  Date of discharge and from where:  [facility]    11/03/2023  Attempts:  1st Attempt  Reason for unsuccessful TCM follow-up call:  No answer/busy

## 2023-11-28 ENCOUNTER — Other Ambulatory Visit (HOSPITAL_BASED_OUTPATIENT_CLINIC_OR_DEPARTMENT_OTHER): Payer: Self-pay | Admitting: Nurse Practitioner

## 2023-11-28 ENCOUNTER — Telehealth: Payer: Self-pay | Admitting: *Deleted

## 2023-11-28 DIAGNOSIS — R2689 Other abnormalities of gait and mobility: Secondary | ICD-10-CM

## 2023-11-28 NOTE — Telephone Encounter (Signed)
Transition Care Management Unsuccessful Follow-up Telephone Call  Date of discharge and from where:  [facility]  11/03/2023  Attempts:  2nd Attempt  Reason for unsuccessful TCM follow-up call:  No answer/busy

## 2023-11-29 ENCOUNTER — Inpatient Hospital Stay: Payer: Medicare Other | Attending: Nurse Practitioner

## 2023-11-29 ENCOUNTER — Telehealth: Payer: Self-pay

## 2023-11-29 ENCOUNTER — Other Ambulatory Visit: Payer: Self-pay | Admitting: *Deleted

## 2023-11-29 ENCOUNTER — Inpatient Hospital Stay: Payer: Medicare Other

## 2023-11-29 DIAGNOSIS — D509 Iron deficiency anemia, unspecified: Secondary | ICD-10-CM | POA: Diagnosis present

## 2023-11-29 DIAGNOSIS — D500 Iron deficiency anemia secondary to blood loss (chronic): Secondary | ICD-10-CM

## 2023-11-29 LAB — FERRITIN: Ferritin: 31 ng/mL (ref 24–336)

## 2023-11-29 LAB — CBC WITH DIFFERENTIAL (CANCER CENTER ONLY)
Abs Immature Granulocytes: 0.01 10*3/uL (ref 0.00–0.07)
Basophils Absolute: 0 10*3/uL (ref 0.0–0.1)
Basophils Relative: 1 %
Eosinophils Absolute: 0.1 10*3/uL (ref 0.0–0.5)
Eosinophils Relative: 2 %
HCT: 41.5 % (ref 39.0–52.0)
Hemoglobin: 14.3 g/dL (ref 13.0–17.0)
Immature Granulocytes: 0 %
Lymphocytes Relative: 37 %
Lymphs Abs: 2.4 10*3/uL (ref 0.7–4.0)
MCH: 31.2 pg (ref 26.0–34.0)
MCHC: 34.5 g/dL (ref 30.0–36.0)
MCV: 90.6 fL (ref 80.0–100.0)
Monocytes Absolute: 0.7 10*3/uL (ref 0.1–1.0)
Monocytes Relative: 11 %
Neutro Abs: 3.1 10*3/uL (ref 1.7–7.7)
Neutrophils Relative %: 49 %
Platelet Count: 273 10*3/uL (ref 150–400)
RBC: 4.58 MIL/uL (ref 4.22–5.81)
RDW: 13.3 % (ref 11.5–15.5)
WBC Count: 6.4 10*3/uL (ref 4.0–10.5)
nRBC: 0 % (ref 0.0–0.2)

## 2023-11-29 MED ORDER — EZETIMIBE 10 MG PO TABS: 10.0000 mg | ORAL_TABLET | Freq: Every day | ORAL | 1 refills | Status: DC

## 2023-11-29 NOTE — Telephone Encounter (Signed)
-----   Message from Gregory [name] sent at 11/29/2023  3:51 PM EST ----- Please call patient with the iron level and hemoglobin are normal, continue iron, follow-up as scheduled

## 2023-11-29 NOTE — Telephone Encounter (Signed)
Patient gave verbal understanding and had no further questions or concerns  

## 2023-12-16 ENCOUNTER — Ambulatory Visit: Payer: Medicare Other | Attending: Cardiology | Admitting: Cardiology

## 2023-12-16 ENCOUNTER — Encounter: Payer: Self-pay | Admitting: Cardiology

## 2023-12-16 ENCOUNTER — Ambulatory Visit: Payer: Medicare Other | Admitting: Cardiology

## 2023-12-16 VITALS — BP 136/68 | HR 68 | Ht 70.0 in | Wt 186.0 lb

## 2023-12-16 DIAGNOSIS — E785 Hyperlipidemia, unspecified: Secondary | ICD-10-CM | POA: Diagnosis present

## 2023-12-16 DIAGNOSIS — I10 Essential (primary) hypertension: Secondary | ICD-10-CM | POA: Insufficient documentation

## 2023-12-16 DIAGNOSIS — I4892 Unspecified atrial flutter: Secondary | ICD-10-CM | POA: Insufficient documentation

## 2023-12-16 NOTE — Progress Notes (Signed)
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    Admitted 5/16 with headache and right lower extremity weakness. CT of the head demonstrated a brain mass measuring 3 x 2 cm with midline shift and edema.  This was a ring enhancing mass in the left basal ganglia.  He underwent stereotactic biopsy by neurosurgery. He was diagnosed with a brain abscess.  Notes from ID indicate his cultures grew out Peptostreptococcus sp.  Patient was followed by infectious disease in the hospital and placed on vancomycin, Rocephin and Flagyl.  He was also placed on Keppra for seizure prophylaxis. 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    Following DC, patient had an episode of elevated heart rate. EMS was called and he was noted to be in atrial flutter. This converted to sinus rhythm spontaneously. Patient placed on toprol. Event monitor showed slow atrial flutter versus ectopic atrial tachycardia.    Monitor July 2021 showed sinus bradycardia, normal sinus rhythm and sinus tachycardia.  Patient did have symptoms of dizziness with monitor in place.  Previously complained of dyspnea.  Hemoglobin found to be 6.8 and he was transfused and placed on iron with improvement.  Carotid Dopplers August 2023 showed no significant stenosis.  Echocardiogram January 2024 showed normal LV function, mild left ventricular hypertrophy.  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  Since he was last seen he denies dyspnea, chest pain, palpitations or syncope.  Current Outpatient Medications  Medication Sig Dispense Refill   acetaminophen (TYLENOL) 325 MG tablet Take 2 tablets (650 mg total) by mouth every 6 (six) hours as needed for mild pain (or Fever >/= 101).     albuterol (PROVENTIL) (2.5  MG/3ML) 0.083% nebulizer solution Take 2.5 mg by nebulization every 6 (six) hours as needed.     Ascorbic Acid (VITAMIN C) 1000 MG tablet Take 500 mg by mouth daily.     budesonide (PULMICORT) 0.5 MG/2ML nebulizer solution Take 2 mLs (0.5 mg total) by nebulization at bedtime. 60 mL 11   Cholecalciferol 125 MCG (5000 UT) capsule Take 5,000 Units by mouth daily.     docusate sodium (COLACE) 100 MG capsule Take 200 mg by mouth at bedtime.     ezetimibe (ZETIA) 10 MG tablet Take 1 tablet (10 mg total) by mouth daily. 90 tablet 1   gabapentin (NEURONTIN) 100 MG capsule TAKE 1 CAPSULE (100 MG TOTAL) BY MOUTH 2 (TWO) TIMES DAILY. 180 capsule 3   Iron, Ferrous Sulfate, 325 (65 Fe) MG TABS Take 325 mg by mouth in the morning, at noon, and at bedtime. 30 tablet 2   levocetirizine (XYZAL) 5 MG tablet Take 1 tablet (5 mg total) by mouth every evening. 90 tablet 1   levothyroxine (SYNTHROID) 150 MCG tablet Take 1 tablet (150 mcg total) by mouth daily before breakfast. 90 tablet 3   magic mouthwash (nystatin, hydrocortisone, diphenhydrAMINE) suspension Swish and swallow 5 mLs 3 (three) times daily as needed (mouth and throat irritation). 480 mL 3   Magnesium 250 MG TABS Take 250 mg by mouth at bedtime.      meclizine (ANTIVERT) 25 MG tablet TAKE 1 TABLET (25 MG TOTAL) BY MOUTH 2 (TWO) TIMES DAILY AS NEEDED FOR DIZZINESS. 30 tablet 5   methocarbamol (ROBAXIN) 500 MG tablet Take 1 tablet (500 mg total) by mouth  every 8 (eight) hours as needed for muscle spasms. 270 tablet 0   montelukast (SINGULAIR) 10 MG tablet TAKE 1 TABLET BY MOUTH AT  BEDTIME 90 tablet 3   Multiple Vitamin (MULTIVITAMIN WITH MINERALS) TABS tablet Take 1 tablet by mouth every morning.     mupirocin ointment (BACTROBAN) 2 % Apply 1 Application topically 2 (two) times daily. To affected area. Patient will call when ready to pick up. 30 g 0   pantoprazole (PROTONIX) 40 MG tablet TAKE 1 TABLET BY MOUTH TWICE  DAILY 180 tablet 3   silodosin  (RAPAFLO) 8 MG CAPS capsule Take 1 capsule (8 mg total) by mouth at bedtime. 90 capsule 1   sodium chloride 1 g tablet Take 1 tablet (1 g total) by mouth 2 (two) times daily with a meal. 180 tablet 3   valsartan (DIOVAN) 40 MG tablet TAKE 1 TABLET BY MOUTH DAILY 90 tablet 1   No current facility-administered medications for this visit.     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  WITHOUT HEMOPTYSIS   History of nonmelanoma skin cancer EXCISION SQUAMOUS CELL FROM HAND   Hospital discharge follow-up 01/18/2023   Hypertrophy of prostate with urinary obstruction and other lower urinary tract symptoms (LUTS)    ILD (interstitial lung disease) (HCC)    Irritable bowel syndrome    Nocturnal leg cramps 06/11/2013   Pain in both lower extremities 10/28/2022   Plantar fascial fibromatosis    Pressure ulcer caused by device 01/07/2023   Pure hypercholesterolemia    RSV (respiratory syncytial virus pneumonia) 01/07/2023   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  Added automatically from request for surgery [id number]   Vaccine counseling  01/24/2023    Past Surgical History:  Procedure Laterality Date   APPLICATION OF CRANIAL NAVIGATION N/A 05/16/2015   Procedure: APPLICATION OF CRANIAL NAVIGATION;  Surgeon: Lisbeth Renshaw, MD;  Location: [facility];  Service: Neurosurgery;  Laterality: N/A;   BRAIN BIOPSY Left 05/16/2015   Procedure: Stereotactic Left Brain Biopsy with Brain Lab;  Surgeon: Lisbeth Renshaw, MD;  Location: [facility];  Service: Neurosurgery;  Laterality: Left;  Stereotactic Left Brain biopsy with brainlab   BRAIN SURGERY     CARDIOVASCULAR STRESS TEST  01/14/2003   NO ISCHEMIA / EF 61%/ NORMAL LE WALL MOTION   EXCISION LEFT WRIST GANGLIAN/ MYXOID CYST  05/30/2009   IR RADIOLOGIST EVAL & MGMT  02/01/2019   NASAL SINUS SURGERY     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 Sharyn Lull   TRANSTHORACIC ECHOCARDIOGRAM  10-17-2008   DR Evey Mcmahan   NORMAL LVF/ EF 60%/  MILDLY DILATED RIGHT ATRIUM/ VENTRICULE   VARICOCELECTOMY  1991   Dr. Gaynelle Arabian    Social History   Socioeconomic History   Marital status: Married    Spouse name: Dewayne Hatch   Number of children: 2   Years of education: Not on file   Highest education level: Master's degree (e.g., MA, MS, MEng, MEd, MSW, MBA)  Occupational History    Comment: Consultant   Occupation: retired  Tobacco Use   Smoking status: Never   Smokeless tobacco: Never  Vaping Use   Vaping status: Never Used  Substance and Sexual Activity   Alcohol use: No    Alcohol/week: 0.0 standard drinks of alcohol   Drug use: No   Sexual activity: Not on file  Other Topics Concern   Not on file  Social History Narrative   Lives with wife in a one story home.  Has 2 sons.  Retired Equities trader.  Education: Masters degree.      05/09/19- Pt states that his balance is worse since last  visit. He has not fallen but has come close on a few occassions. The weakness on his right side is the same, he states.      Right Handed   Drinks caffeine    Social Drivers of Health   Financial Resource Strain: Low Risk  (04/22/2023)   Overall Financial Resource Strain (CARDIA)    Difficulty of Paying Living Expenses: Not hard at all  Food Insecurity: No Food Insecurity (04/22/2023)   Hunger Vital Sign    Worried About Running Out of Food in the Last Year: Never true    Ran Out of Food in the Last Year: Never true  Transportation Needs: No Transportation Needs (04/22/2023)   PRAPARE - Administrator, Civil Service (Medical): No    Lack of Transportation (Non-Medical): No  Physical Activity: Insufficiently Active (04/22/2023)   Exercise Vital Sign    Days of Exercise per Week: 2 days    Minutes of Exercise per Session: 20 min  Stress: No Stress Concern Present (04/22/2023)   [employer] of Occupational Health - Occupational Stress Questionnaire    Feeling of Stress : Not at all  Social Connections: Socially Integrated (04/22/2023)   Social Connection and Isolation Panel [NHANES]    Frequency of Communication with Friends and Family: More than three times a week    Frequency of Social Gatherings with Friends and Family: Once a week    Attends Religious Services: More than 4 times per year    Active Member of [employer] or Organizations: Yes    Attends Banker Meetings: More than 4 times per year    Marital Status: Married  Catering manager Violence: Unknown (02/16/2023)   Received from [employer], [facility]   HITS    Physically Hurt: Not on file    Insult or Talk Down To: Not on file    Threaten Physical Harm: Not on file    Scream or Curse: Not on file    Family History  Problem Relation Age of Onset   Diabetes Mother    Hypertension Mother    Pancreatic cancer Mother    Stroke Mother    Kidney failure Father    Hypertension Sister    Healthy Son  ROS: no fevers or chills, productive cough, hemoptysis, dysphasia, odynophagia, melena, hematochezia, dysuria, hematuria, rash, seizure activity, orthopnea, PND, pedal edema, claudication. Remaining systems are negative.  Physical Exam: Well-developed well-nourished in no acute distress.  Skin is warm and dry.  HEENT is normal.  Neck is supple.  Chest is clear to auscultation with normal expansion.  Cardiovascular exam is regular rate and rhythm.  Abdominal exam nontender or distended. No masses palpated. Extremities show no edema. neuro grossly intact  A/P  1 history of atrial flutter versus ectopic atrial tachycardia-patient has had no recurrences based on history.  He has a history of Osler-Weber-Rendu and AV malformation/GI bleeding and is therefore not a candidate for anticoagulation.  2 history of orthostatic hypotension-continue to encourage increased fluid and sodium intake. No recent symptoms.  3 history of coronary calcification-patient is intolerant to statins.  4 hyperlipidemia-continue Zetia.  Olga Millers, MD

## 2023-12-16 NOTE — Patient Instructions (Addendum)
    Follow-Up: At [facility], you and your health needs are our priority.  As part of our continuing mission to provide you with exceptional heart care, we have created designated Provider Care Teams.  These Care Teams include your primary Cardiologist (physician) and Advanced Practice Providers (APPs -  Physician Assistants and Nurse Practitioners) who all work together to provide you with the care you need, when you need it.     Your next appointment:   Sept 2025

## 2023-12-18 ENCOUNTER — Other Ambulatory Visit: Payer: Self-pay | Admitting: Nurse Practitioner

## 2023-12-18 DIAGNOSIS — N138 Other obstructive and reflux uropathy: Secondary | ICD-10-CM

## 2023-12-21 ENCOUNTER — Emergency Department (HOSPITAL_BASED_OUTPATIENT_CLINIC_OR_DEPARTMENT_OTHER)
Admission: EM | Admit: 2023-12-21 | Discharge: 2023-12-21 | Disposition: A | Discharge status: Home / Self Care | Payer: Medicare Other | Attending: Emergency Medicine | Admitting: Emergency Medicine

## 2023-12-21 ENCOUNTER — Emergency Department (HOSPITAL_BASED_OUTPATIENT_CLINIC_OR_DEPARTMENT_OTHER): Payer: Medicare Other | Admitting: Radiology

## 2023-12-21 ENCOUNTER — Other Ambulatory Visit: Payer: Self-pay

## 2023-12-21 ENCOUNTER — Encounter (HOSPITAL_BASED_OUTPATIENT_CLINIC_OR_DEPARTMENT_OTHER): Payer: Self-pay | Admitting: Emergency Medicine

## 2023-12-21 DIAGNOSIS — J101 Influenza due to other identified influenza virus with other respiratory manifestations: Secondary | ICD-10-CM | POA: Diagnosis not present

## 2023-12-21 DIAGNOSIS — Z79899 Other long term (current) drug therapy: Secondary | ICD-10-CM | POA: Insufficient documentation

## 2023-12-21 DIAGNOSIS — Z20822 Contact with and (suspected) exposure to covid-19: Secondary | ICD-10-CM | POA: Insufficient documentation

## 2023-12-21 DIAGNOSIS — I509 Heart failure, unspecified: Secondary | ICD-10-CM | POA: Insufficient documentation

## 2023-12-21 DIAGNOSIS — R059 Cough, unspecified: Secondary | ICD-10-CM | POA: Diagnosis present

## 2023-12-21 LAB — RESP PANEL BY RT-PCR (RSV, FLU A&B, COVID)  RVPGX2
Influenza A by PCR: POSITIVE — AB
Influenza B by PCR: NEGATIVE
Resp Syncytial Virus by PCR: NEGATIVE
SARS Coronavirus 2 by RT PCR: NEGATIVE

## 2023-12-21 MED ORDER — IPRATROPIUM-ALBUTEROL 0.5-2.5 (3) MG/3ML IN SOLN
3.0000 mL | Freq: Once | RESPIRATORY_TRACT | Status: AC
  Administered 2023-12-21: 3 mL via RESPIRATORY_TRACT
  Filled 2023-12-21: qty 3

## 2023-12-21 MED ORDER — PREDNISONE 10 MG PO TABS: 40.0000 mg | ORAL_TABLET | Freq: Every day | ORAL | 0 refills | Status: DC

## 2023-12-21 MED ORDER — OSELTAMIVIR PHOSPHATE 75 MG PO CAPS: 75.0000 mg | ORAL_CAPSULE | Freq: Two times a day (BID) | ORAL | 0 refills | Status: DC

## 2023-12-21 MED ORDER — PREDNISONE 50 MG PO TABS
60.0000 mg | ORAL_TABLET | Freq: Once | ORAL | Status: AC
  Administered 2023-12-21: 60 mg via ORAL
  Filled 2023-12-21: qty 1

## 2023-12-21 NOTE — Discharge Instructions (Addendum)
 Prescriptions for prednisone  and Tamiflu  were sent to your pharmacy.  Take as prescribed.  Stay hydrated.  Take Tylenol  and ibuprofen as needed for pain and discomfort.  Continue breathing treatments as needed for cough and shortness of breath.  Return to the emergency department for any new or worsening symptoms of concern.

## 2023-12-21 NOTE — ED Provider Notes (Signed)
  Physical Exam  BP (!) 146/64   Pulse 93   Temp 99 F (37.2 C) (Temporal)   Resp 20   Wt 81.6 kg   SpO2 92%   BMI 25.83 kg/m   Physical Exam  Procedures  Procedures  ED Course / MDM    Medical Decision Making Amount and/or Complexity of Data Reviewed Radiology: ordered.  Risk Prescription drug management.   Received in signout.  URI symptoms cough.  Flu positive.  X-ray does not show pneumonia.  Dr. Melvenia and had discussion with patient about Tamiflu .  Otherwise had 5 days of symptoms as high risk.  Will discharge home.       Patsey Lot, MD 12/21/23 [phone]

## 2023-12-21 NOTE — ED Provider Notes (Addendum)
 [place] EMERGENCY DEPARTMENT AT [facility] Provider Note   CSN: [id number] Arrival date & time: 12/21/23  0559     History  Chief Complaint  Patient presents with   Cough   Emesis    Gregory [name] is a 83 y.o. male.   Cough Associated symptoms: myalgias and shortness of breath   Emesis Associated symptoms: arthralgias, cough and myalgias   Patient presents for flulike symptoms.  Medical history includes atrial fibrillation, HLD, GERD, IBS, BPH, anemia, arthritis, neuropathy, CHF, ILD, Osler-Weber-Rendu syndrome, brain abscess.  He was together with family over the Christmas holiday.  Other family members developed URI symptoms.  Patient had onset of symptoms 5 days ago.  Symptoms have included cough, myalgias, arthralgias, congestion.  He had an episode of emesis yesterday.  This was posttussive.  He has not had any known fevers.  He has been treating his aches with Tylenol .  He has been taking Robitussin gelcaps for his cough.  He does use nebulized breathing treatments at home.  He has had some mild shortness of breath.  Breathing treatments have improved his breathing and his cough.     Home Medications Prior to Admission medications   Medication Sig Start Date End Date Taking? Authorizing Provider  oseltamivir  (TAMIFLU ) 75 MG capsule Take 1 capsule (75 mg total) by mouth every 12 (twelve) hours. 12/21/23  Yes Melvenia Motto, MD  predniSONE  (DELTASONE ) 10 MG tablet Take 4 tablets (40 mg total) by mouth daily for 3 days. 12/21/23 12/24/23 Yes Melvenia Motto, MD  acetaminophen  (TYLENOL ) 325 MG tablet Take 2 tablets (650 mg total) by mouth every 6 (six) hours as needed for mild pain (or Fever >/= 101). 01/04/23   Elgergawy, Brayton RAMAN, MD  albuterol  (PROVENTIL ) (2.5 MG/3ML) 0.083% nebulizer solution Take 2.5 mg by nebulization every 6 (six) hours as needed. 05/19/23   [provider]  Ascorbic Acid  (VITAMIN C ) 1000 MG tablet Take 500 mg by mouth daily.    [provider]  budesonide  (PULMICORT ) 0.5 MG/2ML nebulizer solution Take 2 mLs (0.5 mg total) by nebulization at bedtime. 10/10/23   Hunsucker, Donnice JONELLE, MD  Cholecalciferol  125 MCG (5000 UT) capsule Take 5,000 Units by mouth daily.    [provider]  docusate sodium  (COLACE) 100 MG capsule Take 200 mg by mouth at bedtime.    [provider]  ezetimibe  (ZETIA ) 10 MG tablet Take 1 tablet (10 mg total) by mouth daily. 11/29/23 02/27/24  Pietro Redell RAMAN, MD  gabapentin  (NEURONTIN ) 100 MG capsule TAKE 1 CAPSULE (100 MG TOTAL) BY MOUTH 2 (TWO) TIMES DAILY. 07/27/23   Oris Camie BRAVO, NP  Iron , Ferrous Sulfate , 325 (65 Fe) MG TABS Take 325 mg by mouth in the morning, at noon, and at bedtime. 08/23/21   Ghimire, [name], MD  levocetirizine (XYZAL ) 5 MG tablet Take 1 tablet (5 mg total) by mouth every evening. 06/27/18   Joshua Debby CROME, MD  levothyroxine  (SYNTHROID ) 150 MCG tablet Take 1 tablet (150 mcg total) by mouth daily before breakfast. 01/24/23   Early, [name], NP  magic mouthwash (nystatin , hydrocortisone , diphenhydrAMINE ) suspension Swish and swallow 5 mLs 3 (three) times daily as needed (mouth and throat irritation). 12/23/22   Early, [name], NP  Magnesium  250 MG TABS Take 250 mg by mouth at bedtime.     [provider]  meclizine  (ANTIVERT ) 25 MG tablet TAKE 1 TABLET (25 MG TOTAL) BY MOUTH 2 (TWO) TIMES DAILY AS NEEDED FOR DIZZINESS. 11/29/23  Early, [name], NP  methocarbamol  (ROBAXIN ) 500 MG tablet Take 1 tablet (500 mg total) by mouth every 8 (eight) hours as needed for muscle spasms. 10/26/21   Joshua Debby CROME, MD  montelukast  (SINGULAIR ) 10 MG tablet TAKE 1 TABLET BY MOUTH AT  BEDTIME 05/30/23   Early, [name], NP  Multiple Vitamin (MULTIVITAMIN WITH MINERALS) TABS tablet Take 1 tablet by mouth every morning.    [provider]  mupirocin  ointment (BACTROBAN ) 2 % Apply 1 Application topically 2 (two) times daily. To affected area. Patient will call when ready to pick  up. 10/06/23   Early, Camie BRAVO, NP  pantoprazole  (PROTONIX ) 40 MG tablet TAKE 1 TABLET BY MOUTH TWICE  DAILY 05/30/23   Early, [name], NP  silodosin  (RAPAFLO ) 8 MG CAPS capsule TAKE 1 CAPSULE BY MOUTH AT BEDTIME 12/19/23   Early, [name], NP  sodium chloride  1 g tablet Take 1 tablet (1 g total) by mouth 2 (two) times daily with a meal. 01/24/23   Early, Camie BRAVO, NP  valsartan  (DIOVAN ) 40 MG tablet TAKE 1 TABLET BY MOUTH DAILY 07/25/23   Early, [name], NP      Allergies    Nsaids, Other, Aspirin, Statins, Demeclocycline, Dust mite extract, and Tizanidine hcl    Review of Systems   Review of Systems  Respiratory:  Positive for cough and shortness of breath.   Gastrointestinal:  Positive for vomiting.  Musculoskeletal:  Positive for arthralgias and myalgias.  All other systems reviewed and are negative.   Physical Exam Updated Vital Signs BP (!) 146/64   Pulse 93   Temp 99 F (37.2 C) (Temporal)   Resp 20   Wt 81.6 kg   SpO2 92%   BMI 25.83 kg/m  Physical Exam Vitals and nursing note reviewed.  Constitutional:      General: He is not in acute distress.    Appearance: Normal appearance. He is well-developed. He is not ill-appearing, toxic-appearing or diaphoretic.  HENT:     Head: Normocephalic and atraumatic.     Right Ear: External ear normal.     Left Ear: External ear normal.     Nose: Nose normal.  Eyes:     Extraocular Movements: Extraocular movements intact.     Conjunctiva/sclera: Conjunctivae normal.  Cardiovascular:     Rate and Rhythm: Normal rate and regular rhythm.     Heart sounds: No murmur heard. Pulmonary:     Effort: Pulmonary effort is normal. No respiratory distress.     Breath sounds: Normal breath sounds. No wheezing, rhonchi or rales.  Abdominal:     General: There is no distension.     Palpations: Abdomen is soft.     Tenderness: There is no abdominal tenderness.  Musculoskeletal:        General: No swelling.     Cervical back: Normal range of motion  and neck supple.  Skin:    General: Skin is warm and dry.     Coloration: Skin is not jaundiced or pale.  Neurological:     General: No focal deficit present.     Mental Status: He is alert and oriented to person, place, and time.  Psychiatric:        Mood and Affect: Mood normal.        Behavior: Behavior normal.     ED Results / Procedures / Treatments   Labs (all labs ordered are listed, but only abnormal results are displayed) Labs Reviewed  RESP PANEL  BY RT-PCR (RSV, FLU A&B, COVID)  [id number] - Abnormal; Notable for the following components:      Result Value   Influenza A by PCR POSITIVE (*)    All other components within normal limits    EKG None  Radiology No results found.  Procedures Procedures    Medications Ordered in ED Medications  predniSONE  (DELTASONE ) tablet 60 mg (60 mg Oral Given 12/21/23 0659)  ipratropium-albuterol  (DUONEB) 0.5-2.5 (3) MG/3ML nebulizer solution 3 mL (3 mLs Nebulization Given 12/21/23 9342)    ED Course/ Medical Decision Making/ A&P                                 Medical Decision Making Amount and/or Complexity of Data Reviewed Radiology: ordered.  Risk Prescription drug management.   Patient presenting for flulike symptoms over the past 5 days.  He has had sick contacts at home.  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  He has had some mild shortness of breath.  He has a nonproductive cough.  On arrival in the ED, vital signs are normal.  SpO2 is in the low 90s on room air, which is his baseline.  Lungs are currently clear to auscultation.  Will check COVID/flu/RSV and chest x-ray today.  Given his history, patient was given some prednisone  and DuoNeb.  Patient tested positive for influenza A.  I had a shared decision-making discussion with him and his wife regarding Tamiflu .  Benefits may be limited due to patient being on day 5 of  symptoms.  Given his underlying chronic lung disease, he does wish to take this medication.  Prescription was provided.  X-ray was pending at time of signout.  Care of patient was signed out to oncoming ED provider.        Final Clinical Impression(s) / ED Diagnoses Final diagnoses:  Influenza A    Rx / DC Orders ED Discharge Orders          Ordered    oseltamivir  (TAMIFLU ) 75 MG capsule  Every 12 hours        12/21/23 0809    predniSONE  (DELTASONE ) 10 MG tablet  Daily        12/21/23 0809              Melvenia Motto, MD 12/21/23 9344    Melvenia Motto, MD 12/21/23 [phone]

## 2023-12-21 NOTE — ED Triage Notes (Signed)
 Pt c/o cough, body aches, congestion with gen. Body aches x 1 week. Denies fever N/V today

## 2023-12-22 ENCOUNTER — Encounter: Payer: Self-pay | Admitting: Nurse Practitioner

## 2023-12-22 ENCOUNTER — Ambulatory Visit: Payer: Medicare Other | Admitting: Nurse Practitioner

## 2023-12-22 VITALS — BP 130/68 | HR 80 | Temp 98.2°F | Wt 186.8 lb

## 2023-12-22 DIAGNOSIS — J849 Interstitial pulmonary disease, unspecified: Secondary | ICD-10-CM | POA: Diagnosis not present

## 2023-12-22 DIAGNOSIS — I4892 Unspecified atrial flutter: Secondary | ICD-10-CM

## 2023-12-22 DIAGNOSIS — J101 Influenza due to other identified influenza virus with other respiratory manifestations: Secondary | ICD-10-CM

## 2023-12-22 DIAGNOSIS — I5032 Chronic diastolic (congestive) heart failure: Secondary | ICD-10-CM

## 2023-12-22 MED ORDER — HYDROCODONE BIT-HOMATROP MBR 5-1.5 MG/5ML PO SOLN
5.0000 mL | Freq: Three times a day (TID) | ORAL | 0 refills | Status: DC | PRN
Start: 2023-12-22 — End: 2024-07-25

## 2023-12-22 MED ORDER — AZITHROMYCIN 250 MG PO TABS
ORAL_TABLET | ORAL | 0 refills | Status: AC
Start: 2023-12-22 — End: 2023-12-27

## 2023-12-22 MED ORDER — PREDNISONE 10 MG PO TABS: 40.0000 mg | ORAL_TABLET | Freq: Every day | ORAL | 0 refills | Status: AC

## 2023-12-22 NOTE — Patient Instructions (Signed)
 I want you to rest and drink plenty of fluids.

## 2023-12-22 NOTE — Progress Notes (Signed)
 Gregory FORBES Doing, DNP, AGNP-c [facility] [address] [place], [place] [address] [phone]   ACUTE VISIT- ESTABLISHED PATIENT  Blood pressure 130/68, pulse 80, temperature 98.2 F (36.8 C), weight 186 lb 12.8 oz (84.7 kg), SpO2 91%.  Subjective:  HPI Gregory [name] is a 83 y.o. male presents to day for evaluation of acute concern(s).   Gregory [name] presents today with recent onset of respiratory symptoms.  He reports feeling unwell with symptoms starting around Christmas.  The symptoms include a persistent cough, muscle aches throughout the body, and an episode of vomiting.  He sought medical attention in the emergency room yesterday due to the severity of his symptoms, where he was diagnosed with influenza A.  He was started on Tamiflu  and prednisone  at that time.  He has previously required oxygen at home however today he denies any requirement for oxygen and his oxygen saturation levels have been maintaining between 88 to 91%.  He does describe episodes of intense coughing which have been concerning for him.  He also reports poor sleep quality due to discomfort caused by his symptoms.  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  He experienced a similar episode of illness around this time last year which took quite a while to recover from.  His chronic lung disease and these additional comorbidities increase the risk of complications from respiratory infections.  ROS negative except for what is listed in HPI. History, Medications, Surgery, SDOH, and Family History reviewed and updated as appropriate.  Objective:  Physical Exam Vitals and nursing note reviewed.  Constitutional:      General: He is not in acute distress.    Appearance: He is ill-appearing.  HENT:     Head: Normocephalic.  Eyes:     Conjunctiva/sclera: Conjunctivae normal.  Neck:     Vascular: No carotid bruit.  Cardiovascular:     Rate and Rhythm:  Normal rate and regular rhythm.     Pulses: Normal pulses.     Heart sounds: Normal heart sounds.  Pulmonary:     Breath sounds: Rhonchi and rales present.     Comments: Tachypnea present.  Abdominal:     General: Bowel sounds are normal.     Palpations: Abdomen is soft.  Musculoskeletal:     Cervical back: Neck supple. No tenderness.     Right lower leg: No edema.     Left lower leg: No edema.  Lymphadenopathy:     Cervical: Cervical adenopathy present.  Skin:    General: Skin is warm and dry.     Capillary Refill: Capillary refill takes less than 2 seconds.  Neurological:     General: No focal deficit present.     Mental Status: He is alert and oriented to person, place, and time.     Motor: No weakness.     Gait: Gait normal.  Psychiatric:        Mood and Affect: Mood normal.        Behavior: Behavior normal.          Assessment & Plan:   Problem List Items Addressed This Visit     Interstitial lung disease (HCC)   Chronic lung disease with baseline oxygen saturation of 88 to 91%.  Increased pneumonia risk.  Recent x-ray slightly worse than previous x-rays which do increase concerns for possible pneumonia particularly given the rhonchi he is experiencing today.  We discussed the importance of monitoring oxygen levels  and potential need for antibiotics. - Use home oxygen reader manage oxygen levels.  They should be expected to remain at 88% or higher.  If they are falling below this please let me know immediately or seek emergency attention      Influenza A - Primary   Confirmed influenza A with symptoms of muscle aches, vomiting, congestion, and severe coughing in the setting of chronic lung disease.  He was started on Tamiflu  and prednisone  in the ER yesterday.  His lungs were clear on x-ray but I feel he is at such a high risk of secondary bacterial infection due to chronic lung disease further treatment may be necessary.  We discussed the risks and benefits of starting  an antibiotic given the rhonchi present in his lungs today.  He is in agreement to this and feels confident with this treatment. -Continue Tamiflu  -Continue prednisone  40 mg a day for 6 days -Start antibiotic immediately -I have sent cough syrup to aid in symptom relief and sleep -Continue to monitor oxygen levels at home and let me know immediately if they are falling below 88%.  If you are having symptoms please report to the emergency room immediately -Rest and stay hydrated      Relevant Medications   HYDROcodone  bit-homatropine (HYCODAN) 5-1.5 MG/5ML syrup   azithromycin  (ZITHROMAX ) 250 MG tablet      Gregory FORBES Doing, DNP, AGNP-c  Time: 31 minutes, >50% spent counseling, care coordination, chart review, and documentation.

## 2023-12-26 ENCOUNTER — Ambulatory Visit (INDEPENDENT_AMBULATORY_CARE_PROVIDER_SITE_OTHER): Payer: Medicare Other | Admitting: Nurse Practitioner

## 2023-12-26 ENCOUNTER — Encounter: Payer: Self-pay | Admitting: Nurse Practitioner

## 2023-12-26 VITALS — BP 120/78 | HR 72 | Wt 186.8 lb

## 2023-12-26 DIAGNOSIS — J101 Influenza due to other identified influenza virus with other respiratory manifestations: Secondary | ICD-10-CM | POA: Diagnosis not present

## 2023-12-26 DIAGNOSIS — J849 Interstitial pulmonary disease, unspecified: Secondary | ICD-10-CM | POA: Diagnosis not present

## 2023-12-26 DIAGNOSIS — K5904 Chronic idiopathic constipation: Secondary | ICD-10-CM | POA: Insufficient documentation

## 2023-12-26 NOTE — Assessment & Plan Note (Signed)
 Persistent respiratory congestion with yellowish sputum, improving but still present. No fever or chills. Oxygen saturation at 96%, no signs of pneumonia. Lungs clear. Discussed Mucinex  for congestion, noting potential jitteriness. Emphasized use of flutter valve and incentive spirometer to clear mucus and prevent lung collapse. - Continue Mucinex  during the day - Use cough syrup at bedtime - Continue using flutter valve and incentive spirometer

## 2023-12-26 NOTE — Assessment & Plan Note (Signed)
 Ongoing constipation despite Dulcolax. Symptoms started last week with some improvement. Discussed magnesium  citrate as an alternative, starting with a quarter bottle and increasing to half if needed. Emphasized adequate hydration with water  and fruit juices. - Try magnesium  citrate, starting with a quarter bottle and increasing to half if needed - Ensure adequate hydration with water  and fruit juices

## 2023-12-26 NOTE — Progress Notes (Signed)
 Camie FORBES Doing, DNP, AGNP-c [facility] [address] [place], [place] [address] [phone]   ACUTE VISIT- ESTABLISHED PATIENT  Blood pressure 120/78, pulse 72, weight 186 lb 12.8 oz (84.7 kg), SpO2 96%.  Subjective:  HPI Gregory [name] is a 83 y.o. male presents to day for evaluation of acute concern(s).   History of Present Illness Gregory [name] presents for follow-up of cough, congestion and constipation. He reports that the congestion has been improving, with mucus breaking up and becoming expectorated. The mucus is described as yellowish in color. The patient also reports experiencing shortness of breath upon exertion but notes that his oxygen levels have been stable. The patient has been using a flutter valve and an incentive spirometer to manage his lung condition. He has been adhering to the prescribed medication regimen, which includes an antibiotic course that has now been completed.  The patient has been taking Dulcolax for constipation, which began approximately a week ago. While there has been some improvement, the patient reports that he is not yet fully relieved.  He reports that he has been drinking plenty of fluids and consuming fruit juices.  ROS negative except for what is listed in HPI. History, Medications, Surgery, SDOH, and Family History reviewed and updated as appropriate.  Objective:  Physical Exam Vitals and nursing note reviewed.  Constitutional:      General: He is not in acute distress.    Appearance: Normal appearance. He is not ill-appearing.  HENT:     Head: Normocephalic.  Eyes:     Conjunctiva/sclera: Conjunctivae normal.  Cardiovascular:     Rate and Rhythm: Normal rate and regular rhythm.     Pulses: Normal pulses.     Heart sounds: Normal heart sounds.  Pulmonary:     Effort: Pulmonary effort is normal.     Breath sounds: Wheezing present. No rhonchi.  Abdominal:     General: There is no distension.     Tenderness: There is no  abdominal tenderness. There is no guarding.  Musculoskeletal:     Right lower leg: No edema.     Left lower leg: No edema.  Skin:    General: Skin is warm and dry.     Capillary Refill: Capillary refill takes less than 2 seconds.  Neurological:     General: No focal deficit present.     Mental Status: He is alert and oriented to person, place, and time.  Psychiatric:        Mood and Affect: Mood normal.        Behavior: Behavior normal.          Assessment & Plan:   Problem List Items Addressed This Visit     Interstitial lung disease (HCC) - Primary   Chronic. Mild wheezing noted in the right lower lobe with no rhonchi present. O2 is stable. No changes.       Influenza A   Persistent respiratory congestion with yellowish sputum, improving but still present. No fever or chills. Oxygen saturation at 96%, no signs of pneumonia. Lungs clear. Discussed Mucinex  for congestion, noting potential jitteriness. Emphasized use of flutter valve and incentive spirometer to clear mucus and prevent lung collapse. - Continue Mucinex  during the day - Use cough syrup at bedtime - Continue using flutter valve and incentive spirometer      Chronic idiopathic constipation   Ongoing constipation despite Dulcolax. Symptoms started last week with some improvement. Discussed magnesium  citrate as an alternative, starting with a quarter bottle and increasing  to half if needed. Emphasized adequate hydration with water  and fruit juices. - Try magnesium  citrate, starting with a quarter bottle and increasing to half if needed - Ensure adequate hydration with water  and fruit juices         Camie FORBES Doing, DNP, AGNP-c

## 2023-12-26 NOTE — Patient Instructions (Signed)
 Try 1/4 to 1/2 of a bottle magnesium  citrate. This can be purchased at the pharmacy in the laxative aisle. It is a liquid in a bottle (some times green or clear). You can repeat this the following day if needed.    Finish up the steroid.   I also want you to use the flutter valve and incentive spirometer.   You can use mucinex  during the day to help get the mucous out of the lungs and the prescription cough syrup at night.

## 2023-12-26 NOTE — Assessment & Plan Note (Signed)
 Chronic. Mild wheezing noted in the right lower lobe with no rhonchi present. O2 is stable. No changes.

## 2023-12-27 NOTE — Assessment & Plan Note (Signed)
 Confirmed influenza A with symptoms of muscle aches, vomiting, congestion, and severe coughing in the setting of chronic lung disease.  He was started on Tamiflu  and prednisone  in the ER yesterday.  His lungs were clear on x-ray but I feel he is at such a high risk of secondary bacterial infection due to chronic lung disease further treatment may be necessary.  We discussed the risks and benefits of starting an antibiotic given the rhonchi present in his lungs today.  He is in agreement to this and feels confident with this treatment. -Continue Tamiflu  -Continue prednisone  40 mg a day for 6 days -Start antibiotic immediately -I have sent cough syrup to aid in symptom relief and sleep -Continue to monitor oxygen levels at home and let me know immediately if they are falling below 88%.  If you are having symptoms please report to the emergency room immediately -Rest and stay hydrated

## 2023-12-27 NOTE — Assessment & Plan Note (Signed)
 Chronic lung disease with baseline oxygen saturation of 88 to 91%.  Increased pneumonia risk.  Recent x-ray slightly worse than previous x-rays which do increase concerns for possible pneumonia particularly given the rhonchi he is experiencing today.  We discussed the importance of monitoring oxygen levels and potential need for antibiotics. - Use home oxygen reader manage oxygen levels.  They should be expected to remain at 88% or higher.  If they are falling below this please let me know immediately or seek emergency attention

## 2024-01-19 ENCOUNTER — Other Ambulatory Visit: Payer: Self-pay

## 2024-01-19 ENCOUNTER — Emergency Department (HOSPITAL_COMMUNITY)
Admission: EM | Admit: 2024-01-19 | Discharge: 2024-01-19 | Disposition: A | Discharge status: Home / Self Care | Payer: Medicare Other | Attending: Emergency Medicine | Admitting: Emergency Medicine

## 2024-01-19 ENCOUNTER — Encounter (HOSPITAL_COMMUNITY): Payer: Self-pay

## 2024-01-19 DIAGNOSIS — R55 Syncope and collapse: Secondary | ICD-10-CM | POA: Diagnosis present

## 2024-01-19 DIAGNOSIS — R040 Epistaxis: Secondary | ICD-10-CM | POA: Insufficient documentation

## 2024-01-19 LAB — CBC WITH DIFFERENTIAL/PLATELET
Abs Immature Granulocytes: 0.08 10*3/uL — ABNORMAL HIGH (ref 0.00–0.07)
Basophils Absolute: 0 10*3/uL (ref 0.0–0.1)
Basophils Relative: 0 %
Eosinophils Absolute: 0.1 10*3/uL (ref 0.0–0.5)
Eosinophils Relative: 1 %
HCT: 38.9 % — ABNORMAL LOW (ref 39.0–52.0)
Hemoglobin: 13.5 g/dL (ref 13.0–17.0)
Immature Granulocytes: 1 %
Lymphocytes Relative: 13 %
Lymphs Abs: 1.3 10*3/uL (ref 0.7–4.0)
MCH: 31 pg (ref 26.0–34.0)
MCHC: 34.7 g/dL (ref 30.0–36.0)
MCV: 89.4 fL (ref 80.0–100.0)
Monocytes Absolute: 1 10*3/uL (ref 0.1–1.0)
Monocytes Relative: 10 %
Neutro Abs: 8.1 10*3/uL — ABNORMAL HIGH (ref 1.7–7.7)
Neutrophils Relative %: 75 %
Platelets: 290 10*3/uL (ref 150–400)
RBC: 4.35 MIL/uL (ref 4.22–5.81)
RDW: 13.4 % (ref 11.5–15.5)
WBC: 10.7 10*3/uL — ABNORMAL HIGH (ref 4.0–10.5)
nRBC: 0 % (ref 0.0–0.2)

## 2024-01-19 LAB — BASIC METABOLIC PANEL
Anion gap: 11 (ref 5–15)
BUN: 13 mg/dL (ref 8–23)
CO2: 23 mmol/L (ref 22–32)
Calcium: 8.7 mg/dL — ABNORMAL LOW (ref 8.9–10.3)
Chloride: 96 mmol/L — ABNORMAL LOW (ref 98–111)
Creatinine, Ser: 1.09 mg/dL (ref 0.61–1.24)
GFR, Estimated: >60 mL/min (ref >60)
Glucose, Bld: 114 mg/dL — ABNORMAL HIGH (ref 70–99)
Potassium: 3.9 mmol/L (ref 3.5–5.1)
Sodium: 130 mmol/L — ABNORMAL LOW (ref 135–145)

## 2024-01-19 NOTE — ED Triage Notes (Addendum)
Pt bib [facility] coming from homeafter nose bleed around 0000. After an hour pt began feeling faint. EMS initial BP 68/40 with 48HR. After 500cc bolus, 140/80 and 60 HR. Pt denies injury. Pt reports that he gets nose bleeds several times a week, but today he could not get bleeding to stop. No active bleeding with EMS. Pt denies chest pain, sob. Pt denies n/v and/or abdominal pain. Pt alert and oriented x4.   EMS vital signs: 500 bolus 140/80, HR 60  Sinus Brady EKG  20 Left Hand

## 2024-01-19 NOTE — ED Notes (Signed)
Pt nose now bleeding a small amount. MD at bedside. Pt alert and oriented x4.

## 2024-01-19 NOTE — ED Notes (Signed)
This RN reviewed discharge instructions with patient. he verbalized understanding and denied any further questions. PT well appearing upon discharge and reports no pain. Wheeled pt out to lobby, pt got into car with wife to depart home.

## 2024-01-19 NOTE — ED Provider Notes (Signed)
[place] EMERGENCY DEPARTMENT AT [facility] Provider Note   CSN: [id number] Arrival date & time: 01/19/24  0139     History  Chief Complaint  Patient presents with   Near Syncope   Epistaxis    Gregory [name] is a 83 y.o. male.  Patient presents to the emergency department for evaluation of nosebleed and near syncope.  Patient reports a history of frequent nosebleeds, saw his ENT doctor earlier this week for bleeding and had the right side of his nose cauterized.  He reports that the nose started bleeding from the right side around 11 PM tonight and bled until around 1230.  He reports the bleeding was heavier and longer than usual.  Patient called EMS because he nearly passed out.  He started to feel like he was going to pass out, try to get his head down between his legs but was worried that that would make his bleeding worse.  EMS documented a blood pressure of 68/40 upon their arrival.  He was transported to the ED, given a fluid bolus and blood pressure improved.  Patient feels improved at this time.       Home Medications Prior to Admission medications   Medication Sig Start Date End Date Taking? Authorizing Provider  acetaminophen (TYLENOL) 325 MG tablet Take 2 tablets (650 mg total) by mouth every 6 (six) hours as needed for mild pain (or Fever >/= 101). 01/04/23   Elgergawy, Leana Roe, MD  Ascorbic Acid (VITAMIN C) 1000 MG tablet Take 500 mg by mouth daily.    [provider]  budesonide (PULMICORT) 0.5 MG/2ML nebulizer solution Take 2 mLs (0.5 mg total) by nebulization at bedtime. 10/10/23   Hunsucker, Lesia Sago, MD  Cholecalciferol 125 MCG (5000 UT) capsule Take 5,000 Units by mouth daily.    [provider]  docusate sodium (COLACE) 100 MG capsule Take 200 mg by mouth at bedtime.    [provider]  ezetimibe (ZETIA) 10 MG tablet Take 1 tablet (10 mg total) by mouth daily. 11/29/23 02/27/24  Lewayne Bunting, MD  gabapentin  (NEURONTIN) 100 MG capsule TAKE 1 CAPSULE (100 MG TOTAL) BY MOUTH 2 (TWO) TIMES DAILY. 07/27/23   Tollie Eth, NP  HYDROcodone bit-homatropine (HYCODAN) 5-1.5 MG/5ML syrup Take 5 mLs by mouth every 8 (eight) hours as needed for cough. 12/22/23   Tollie Eth, NP  Iron, Ferrous Sulfate, 325 (65 Fe) MG TABS Take 325 mg by mouth in the morning, at noon, and at bedtime. 08/23/21   Dorcas Carrow, MD  levocetirizine (XYZAL) 5 MG tablet Take 1 tablet (5 mg total) by mouth every evening. 06/27/18   Etta Grandchild, MD  levothyroxine (SYNTHROID) 150 MCG tablet Take 1 tablet (150 mcg total) by mouth daily before breakfast. 01/24/23   Early, Sung Amabile, NP  magic mouthwash (nystatin, hydrocortisone, diphenhydrAMINE) suspension Swish and swallow 5 mLs 3 (three) times daily as needed (mouth and throat irritation). Patient not taking: Reported on 12/22/2023 12/23/22   Tollie Eth, NP  Magnesium 250 MG TABS Take 250 mg by mouth at bedtime.     [provider]  meclizine (ANTIVERT) 25 MG tablet TAKE 1 TABLET (25 MG TOTAL) BY MOUTH 2 (TWO) TIMES DAILY AS NEEDED FOR DIZZINESS. 11/29/23   Early, Sung Amabile, NP  methocarbamol (ROBAXIN) 500 MG tablet Take 1 tablet (500 mg total) by mouth every 8 (eight) hours as needed for muscle spasms. 10/26/21   Etta Grandchild, MD  montelukast (SINGULAIR) 10 MG tablet TAKE 1 TABLET BY MOUTH AT  BEDTIME 05/30/23   Early, Sung Amabile, NP  Multiple Vitamin (MULTIVITAMIN WITH MINERALS) TABS tablet Take 1 tablet by mouth every morning.    [provider]  mupirocin ointment (BACTROBAN) 2 % Apply 1 Application topically 2 (two) times daily. To affected area. Patient will call when ready to pick up. 10/06/23   Early, Sung Amabile, NP  oseltamivir (TAMIFLU) 75 MG capsule Take 1 capsule (75 mg total) by mouth every 12 (twelve) hours. Patient not taking: Reported on 12/26/2023 12/21/23   Gloris Manchester, MD  pantoprazole (PROTONIX) 40 MG tablet TAKE 1 TABLET BY MOUTH TWICE  DAILY 05/30/23   Early, Sung Amabile, NP   silodosin (RAPAFLO) 8 MG CAPS capsule TAKE 1 CAPSULE BY MOUTH AT BEDTIME 12/19/23   Early, Sung Amabile, NP  sodium chloride 1 g tablet Take 1 tablet (1 g total) by mouth 2 (two) times daily with a meal. 01/24/23   Early, Sung Amabile, NP  valsartan (DIOVAN) 40 MG tablet TAKE 1 TABLET BY MOUTH DAILY 07/25/23   Early, Sung Amabile, NP      Allergies    Nsaids, Other, Aspirin, Statins, Demeclocycline, Dust mite extract, and Tizanidine hcl    Review of Systems   Review of Systems  Physical Exam Updated Vital Signs BP (!) 146/60 (BP Location: Right Arm)   Pulse 70   Temp (!) 96 F (35.6 C) (Axillary)   Resp 14   Ht 5\' 10"  (1.778 m)   Wt 80.7 kg   SpO2 99%   BMI 25.54 kg/m  Physical Exam Vitals and nursing note reviewed.  Constitutional:      General: He is not in acute distress.    Appearance: He is well-developed.  HENT:     Head: Normocephalic and atraumatic.     Nose:     Right Nostril: Epistaxis (Dried blood and clots) present.     Mouth/Throat:     Mouth: Mucous membranes are moist.  Eyes:     General: Vision grossly intact. Gaze aligned appropriately.     Extraocular Movements: Extraocular movements intact.     Conjunctiva/sclera: Conjunctivae normal.  Cardiovascular:     Rate and Rhythm: Normal rate and regular rhythm.     Pulses: Normal pulses.     Heart sounds: Normal heart sounds, S1 normal and S2 normal. No murmur heard.    No friction rub. No gallop.  Pulmonary:     Effort: Pulmonary effort is normal. No respiratory distress.     Breath sounds: Normal breath sounds.  Abdominal:     Palpations: Abdomen is soft.     Tenderness: There is no abdominal tenderness. There is no guarding or rebound.     Hernia: No hernia is present.  Musculoskeletal:        General: No swelling.     Cervical back: Full passive range of motion without pain, normal range of motion and neck supple. No pain with movement, spinous process tenderness or muscular tenderness. Normal range of motion.      Right lower leg: No edema.     Left lower leg: No edema.  Skin:    General: Skin is warm and dry.     Capillary Refill: Capillary refill takes less than 2 seconds.     Findings: No ecchymosis, erythema, lesion or wound.  Neurological:     Mental Status: He is alert and oriented to person, place, and time.  GCS: GCS eye subscore is 4. GCS verbal subscore is 5. GCS motor subscore is 6.     Cranial Nerves: Cranial nerves 2-12 are intact.     Sensory: Sensation is intact.     Motor: Motor function is intact. No weakness or abnormal muscle tone.     Coordination: Coordination is intact.  Psychiatric:        Mood and Affect: Mood normal.        Speech: Speech normal.        Behavior: Behavior normal.     ED Results / Procedures / Treatments   Labs (all labs ordered are listed, but only abnormal results are displayed) Labs Reviewed - No data to display  EKG None  Radiology No results found.  Procedures Procedures    Medications Ordered in ED Medications - No data to display  ED Course/ Medical Decision Making/ A&P                                 Medical Decision Making Amount and/or Complexity of Data Reviewed Labs: ordered.   Differential Diagnosis considered includes, but not limited to: Arrhythmia; MI; vasovagal episode; seizure; toxidrome; PE; stroke  Presents for evaluation of syncopal episode.  Patient had been experiencing a right-sided nosebleed for 1 and half hours and then became acutely weak, dizzy and briefly passed out.  EMS documented low blood pressure upon their arrival which has normalized after small fluid bolus.  Patient's basic labs are normal, no anemia to explain the hypotension.  He has had vasovagal response to blood in the past.  Patient not actively bleeding here in the ED.  He can follow-up with his ENT doctor for his recurrent nosebleeds.        Final Clinical Impression(s) / ED Diagnoses Final diagnoses:  None    Rx / DC  Orders ED Discharge Orders     None         Krisy Dix, Canary Brim, MD 01/19/24 [phone]

## 2024-01-23 NOTE — Progress Notes (Signed)
 Catheline Doing, DNP, AGNP-c [facility]  [address] [place], [place] [address] [phone]  ESTABLISHED PATIENT- Chronic Health and/or Follow-Up Visit  Blood pressure 120/70, pulse 61, weight 186 lb 9.6 oz (84.6 kg), SpO2 93%.   History of Present Illness Gregory [name] is an 83 year old male with recurrent hypotensive episodes who presents with syncope and severe epistaxis. He is accompanied by his wife, who provides additional history.  He experiences recurrent episodes of syncope and severe epistaxis. A significant episode of epistaxis occurred last Wednesday night, leading to substantial blood loss and lightheadedness, followed by loss of consciousness. During this episode, he was disoriented and unable to communicate effectively. His blood pressure was recorded at 68/40 mmHg by EMS. He regained consciousness en route to the emergency room but had no recollection of the event. These episodes are not always associated with epistaxis, having occurred at church and while visiting his wife in the hospital. Symptoms include sweating, feeling hot and clammy, and loss of consciousness for about ten minutes, with no recollection of the events. Slight hand tremors were observed during the most recent episode, raising concerns about possible seizure activity.   He is currently on valsartan  for blood pressure management and Zetia  for cholesterol. He takes one gram of salt daily to manage SIADH-related low sodium levels, with a recent sodium level of 130 mmol/L. He also takes three iron  pills daily due to low iron  levels, as advised by his oncologist/hematologist.  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 He is due for repeat CT.  He reports difficulty maintaining balance and has previously undergone physical therapy to address this issue, but it persists. He experiences dizziness when moving  around.  All ROS negative with exception of what is listed above.   PHYSICAL EXAM Physical Exam Vitals and nursing note reviewed.  Constitutional:      General: He is not in acute distress.    Appearance: Normal appearance. He is not ill-appearing.  HENT:     Head: Normocephalic.  Eyes:     Conjunctiva/sclera: Conjunctivae normal.  Cardiovascular:     Rate and Rhythm: Normal rate and regular rhythm.  Pulmonary:     Effort: Pulmonary effort is normal.  Musculoskeletal:     Right lower leg: No edema.     Left lower leg: No edema.  Skin:    General: Skin is warm and dry.     Capillary Refill: Capillary refill takes less than 2 seconds.  Neurological:     Mental Status: He is alert and oriented to person, place, and time.     Motor: Weakness present.     Gait: Gait abnormal.  Psychiatric:        Mood and Affect: Mood normal.        Behavior: Behavior normal.      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 Sodium level was 130 mEq/L last week which does not seem low enough for such severe symptoms. We discussed that low sodium levels can contribute to hypotensive episodes and seizures. Discussed importance of maintaining adequate sodium levels to prevent further complications. - Continue salt tablets, 1-2 gram daily - Monitor sodium levels regularly      Syncope - Primary   Recurrent syncopal/presyncopal episodes of unknown etiology. Not all occurring with epistaxis. Experienced an  episode of epistaxis last Wednesday night that resulted in lightheadedness, confusion, memory changes, twitching, and loss of consciousness. Blood pressure was critically low at 68/40 when EMS arrived. Differentials include vasovagal syncope, dysautonomia, possible seizure activity, or hypotension due to volume loss and antihypertensives. Discussed risks of recurrent hypotensive episodes,  including potential for injury from falls and need for emergency intervention. Explained benefits of increasing salt intake, using compression stockings, and possibly starting midodrine. Discussed stopping valsartan  to prevent further hypotensive episodes and need for close monitoring of blood pressure. Informed about potential need for neurology and cardiology intervention to rule out neurological and cardiac causes. Fortunately he is established with both.  - Contact cardiologist Dr. Pietro to be seen soon - Stop valsartan  - Monitor blood pressure; if >150/90 mmHg, take half of valsartan  - Increase salt intake to 2 grams per day - Ensure adequate hydration - Wear compression stocking during the day - Consider midodrine if symptoms persist - Consult neurologist Dr. Tobie for possible seizure activity and balance issues - In the event of epistaxis or dizziness, recommend lowering self to the floor and, if able, elevating feet.       Iron  deficiency anemia   Chronic iron  deficiency requiring supplementation. Takes three iron  pills daily but still has low iron  levels. Oncologist has considered iron  infusions if levels do not improve. Discussed importance of maintaining adequate iron  levels to prevent anemia-related symptoms and potential need for iron  infusions. - Continue current iron  supplementation - Follow up with oncologist in six weeks for possible iron  infusion       Return in about 6 months (around 07/25/2024) for Med Management 30.  Catheline Doing, DNP, AGNP-c  Vaccines: covid

## 2024-01-26 ENCOUNTER — Ambulatory Visit (INDEPENDENT_AMBULATORY_CARE_PROVIDER_SITE_OTHER): Payer: Medicare Other | Admitting: Nurse Practitioner

## 2024-01-26 ENCOUNTER — Encounter: Payer: Self-pay | Admitting: Nurse Practitioner

## 2024-01-26 VITALS — BP 120/70 | HR 61 | Wt 186.6 lb

## 2024-01-26 DIAGNOSIS — R799 Abnormal finding of blood chemistry, unspecified: Secondary | ICD-10-CM

## 2024-01-26 DIAGNOSIS — E871 Hypo-osmolality and hyponatremia: Secondary | ICD-10-CM

## 2024-01-26 DIAGNOSIS — E222 Syndrome of inappropriate secretion of antidiuretic hormone: Secondary | ICD-10-CM

## 2024-01-26 DIAGNOSIS — D509 Iron deficiency anemia, unspecified: Secondary | ICD-10-CM | POA: Diagnosis not present

## 2024-01-26 DIAGNOSIS — R55 Syncope and collapse: Secondary | ICD-10-CM

## 2024-01-26 NOTE — Assessment & Plan Note (Signed)
 Chronic iron  deficiency requiring supplementation. Takes three iron  pills daily but still has low iron  levels. Oncologist has considered iron  infusions if levels do not improve. Discussed importance of maintaining adequate iron  levels to prevent anemia-related symptoms and potential need for iron  infusions. - Continue current iron  supplementation - Follow up with oncologist in six weeks for possible iron  infusion

## 2024-01-26 NOTE — Assessment & Plan Note (Signed)
 Recurrent syncopal/presyncopal episodes of unknown etiology. Not all occurring with epistaxis. Experienced an episode of epistaxis last Wednesday night that resulted in lightheadedness, confusion, memory changes, twitching, and loss of consciousness. Blood pressure was critically low at 68/40 when EMS arrived. Differentials include vasovagal syncope, dysautonomia, possible seizure activity, or hypotension due to volume loss and antihypertensives. Discussed risks of recurrent hypotensive episodes, including potential for injury from falls and need for emergency intervention. Explained benefits of increasing salt intake, using compression stockings, and possibly starting midodrine. Discussed stopping valsartan  to prevent further hypotensive episodes and need for close monitoring of blood pressure. Informed about potential need for neurology and cardiology intervention to rule out neurological and cardiac causes. Fortunately he is established with both.  - Contact cardiologist Dr. Pietro to be seen soon - Stop valsartan  - Monitor blood pressure; if >150/90 mmHg, take half of valsartan  - Increase salt intake to 2 grams per day - Ensure adequate hydration - Wear compression stocking during the day - Consider midodrine if symptoms persist - Consult neurologist Dr. Tobie for possible seizure activity and balance issues - In the event of epistaxis or dizziness, recommend lowering self to the floor and, if able, elevating feet.

## 2024-01-26 NOTE — Patient Instructions (Addendum)
 STOP Valsartan  for now.  Start wearing compression socks every day while you are awake.  Take at least 1g of salt a day. You may take up to 2g Drink PLENTY of water .  Monitor BP at home. If it is higher than 150 (top) or 90 (bottom) take 1/2 valsartan .  I will send Dr. Pietro my note and see if we can work to get you in to see him sooner.   I will send Dr. Tobie my note as well.    Syncope, Adult  Syncope refers to a condition in which a person temporarily loses consciousness. Syncope may also be called fainting or passing out. It is caused by a sudden decrease in blood flow to the brain. This can happen for a variety of reasons. Most causes of syncope are not dangerous. It can be triggered by things such as needle sticks, seeing blood, pain, or intense emotion. However, syncope can also be a sign of a serious medical problem, such as a heart abnormality. Other causes can include dehydration, migraines, or taking medicines that lower blood pressure. Your health care provider may do tests to find the reason why you are having syncope. If you faint, get medical help right away. Call your local emergency services (911 in the [place] Follow these instructions at home: Pay attention to any changes in your symptoms. Take these actions to stay safe and to help relieve your symptoms: Knowing when you may be about to faint Signs that you may be about to faint include: Feeling dizzy, weak, light-headed, or like the room is spinning. Feeling nauseous. Seeing spots or seeing all white or all black in your field of vision. Having cold, clammy skin or feeling warm and sweaty. Hearing ringing in the ears (tinnitus). If you start to feel like you might faint, sit or lie down right away. If sitting, put your head down between your legs. If lying down, raise (elevate) your feet above the level of your heart. Breathe deeply and steadily. Wait until all the symptoms have passed. Have someone stay with you  until you feel stable. Medicines Take over-the-counter and prescription medicines only as told by your health care provider. If you are taking blood pressure or heart medicine, get up slowly and take several minutes to sit and then stand. This can reduce dizziness and decrease the risk of syncope. Lifestyle Do not drive, use machinery, or play sports until your health care provider says it is okay. Do not drink alcohol. Do not use any products that contain nicotine or tobacco. These products include cigarettes, chewing tobacco, and vaping devices, such as e-cigarettes. If you need help quitting, ask your health care provider. Avoid hot tubs and saunas. General instructions Talk with your health care provider about your symptoms. You may need to have testing to understand the cause of your syncope. Drink enough fluid to keep your urine pale yellow. Avoid prolonged standing. If you must stand for a long time, do movements such as: Moving your legs. Crossing your legs. Flexing and stretching your leg muscles. Squatting. Keep all follow-up visits. This is important. Contact a health care provider if: You have episodes of near fainting. Get help right away if: You faint. You hit your head or are injured after fainting. You have any of these symptoms that may indicate trouble with your heart: Fast or irregular heartbeats (palpitations). Unusual pain in your chest, abdomen, or back. Shortness of breath. You have a seizure. You have a severe headache. You are  confused. You have vision problems. You have severe weakness or trouble walking. You are bleeding from your mouth or rectum, or you have black or tarry stool. These symptoms may represent a serious problem that is an emergency. Do not wait to see if your symptoms will go away. Get medical help right away. Call your local emergency services (911 in the [place] Do not drive yourself to the hospital. Summary Syncope refers to a condition  in which a person temporarily loses consciousness. Syncope may also be called fainting or passing out. It is caused by a sudden decrease in blood flow to the brain. Signs that you may be about to faint include dizziness, feeling light-headed, feeling nauseous, sudden vision changes, or cold, clammy skin. Even though most causes of syncope are not dangerous, syncope can be a sign of a serious medical problem. Get help right away if you faint. If you start to feel like you might faint, sit or lie down right away. If sitting, put your head down between your legs. If lying down, raise (elevate) your feet above the level of your heart. This information is not intended to replace advice given to you by your health care provider. Make sure you discuss any questions you have with your health care provider. Document Revised: 04/16/2021 Document Reviewed: 04/16/2021 Elsevier Patient Education  2024 [employer].

## 2024-01-26 NOTE — Assessment & Plan Note (Signed)
 Chronic condition causing hyponatremia. Sodium level was 130 mEq/L last week which does not seem low enough for such severe symptoms. We discussed that low sodium levels can contribute to hypotensive episodes and seizures. Discussed importance of maintaining adequate sodium levels to prevent further complications. - Continue salt tablets, 1-2 gram daily - Monitor sodium levels regularly

## 2024-01-27 ENCOUNTER — Other Ambulatory Visit: Payer: Medicare Other

## 2024-01-29 LAB — MICROALBUMIN / CREATININE URINE RATIO

## 2024-01-29 LAB — SPECIMEN STATUS REPORT

## 2024-01-30 ENCOUNTER — Other Ambulatory Visit: Payer: Self-pay | Admitting: *Deleted

## 2024-01-30 DIAGNOSIS — R55 Syncope and collapse: Secondary | ICD-10-CM

## 2024-01-31 ENCOUNTER — Ambulatory Visit: Payer: Medicare Other | Admitting: Neurology

## 2024-01-31 ENCOUNTER — Ambulatory Visit (INDEPENDENT_AMBULATORY_CARE_PROVIDER_SITE_OTHER): Payer: Medicare Other | Admitting: Neurology

## 2024-01-31 ENCOUNTER — Encounter: Payer: Self-pay | Admitting: Neurology

## 2024-01-31 ENCOUNTER — Inpatient Hospital Stay: Payer: Medicare Other | Attending: Nurse Practitioner

## 2024-01-31 ENCOUNTER — Ambulatory Visit (HOSPITAL_BASED_OUTPATIENT_CLINIC_OR_DEPARTMENT_OTHER): Payer: Medicare Other

## 2024-01-31 ENCOUNTER — Other Ambulatory Visit: Payer: Medicare Other

## 2024-01-31 VITALS — BP 142/59 | HR 83 | Ht 70.0 in | Wt 187.0 lb

## 2024-01-31 DIAGNOSIS — R2681 Unsteadiness on feet: Secondary | ICD-10-CM

## 2024-01-31 DIAGNOSIS — G620 Drug-induced polyneuropathy: Secondary | ICD-10-CM

## 2024-01-31 DIAGNOSIS — D509 Iron deficiency anemia, unspecified: Secondary | ICD-10-CM | POA: Insufficient documentation

## 2024-01-31 DIAGNOSIS — R55 Syncope and collapse: Secondary | ICD-10-CM

## 2024-01-31 DIAGNOSIS — D500 Iron deficiency anemia secondary to blood loss (chronic): Secondary | ICD-10-CM

## 2024-01-31 LAB — CBC WITH DIFFERENTIAL (CANCER CENTER ONLY)
Abs Immature Granulocytes: 0.02 10*3/uL (ref 0.00–0.07)
Basophils Absolute: 0.1 10*3/uL (ref 0.0–0.1)
Basophils Relative: 1 %
Eosinophils Absolute: 0.1 10*3/uL (ref 0.0–0.5)
Eosinophils Relative: 1 %
HCT: 40.1 % (ref 39.0–52.0)
Hemoglobin: 13.8 g/dL (ref 13.0–17.0)
Immature Granulocytes: 0 %
Lymphocytes Relative: 32 %
Lymphs Abs: 2.7 10*3/uL (ref 0.7–4.0)
MCH: 31 pg (ref 26.0–34.0)
MCHC: 34.4 g/dL (ref 30.0–36.0)
MCV: 90.1 fL (ref 80.0–100.0)
Monocytes Absolute: 1 10*3/uL (ref 0.1–1.0)
Monocytes Relative: 12 %
Neutro Abs: 4.7 10*3/uL (ref 1.7–7.7)
Neutrophils Relative %: 54 %
Platelet Count: 266 10*3/uL (ref 150–400)
RBC: 4.45 MIL/uL (ref 4.22–5.81)
RDW: 14 % (ref 11.5–15.5)
WBC Count: 8.5 10*3/uL (ref 4.0–10.5)
nRBC: 0 % (ref 0.0–0.2)

## 2024-01-31 LAB — ECHOCARDIOGRAM COMPLETE
Area-P 1/2: 2.66 cm2
Height: 70 in
S' Lateral: 3.1 cm
Weight: 2992 [oz_av]

## 2024-01-31 LAB — FERRITIN: Ferritin: 23 ng/mL — ABNORMAL LOW (ref 24–336)

## 2024-01-31 MED ORDER — BACLOFEN 10 MG PO TABS: ORAL_TABLET | ORAL | 5 refills | Status: DC

## 2024-01-31 NOTE — Progress Notes (Signed)
Normal LV function, no change in meds. BC

## 2024-01-31 NOTE — Progress Notes (Signed)
Follow-up Visit   Date: 01/31/24   Gregory [name] MRN: [id number] DOB: 1941/04/02   Interim History: Gregory [name] is a 83 y.o. right-handed Caucasian male with hypothyroidism, hyperlipidemia, Osler-Weber- Rendu disease, GERD, and left external basal ganglia polymicrobial abscess (2016) returning to the clinic for follow-up of gait unsteadiness, neuropathy, and new complains of neck pain.    The patient was accompanied to the clinic by wife.  IMPRESSION/PLAN: Syncope, likely vasovagal.  Symptoms are not suggestive of a seizure as he duration and semiology is not typical.  I offered routine EEG, but he would like to hold on testing for now.  He wants to have cardiac evaluation first and had an appointment later this week.  Gait unsteadiness, multifactorial.  Possibly contributed by leg spasticity (history of intracranial abscess in 2016) and neuropathy.  Start baclofen 5mg  at bedtine x 1 week, then titrate to 5mg  BID.  Continue PT exercises.  Drug-induced neuropathy, stable and mild.   - He takes gabapentin 100mg  BID for chronic cough  5. 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     Return to clinic in 1 year  ------------------------------------------------------- History of present illness: He was hospitalized from May 24 - May 21 2015 with right sided leg weakness and headaches. Patient was found to have a biopsy-proven brain abscess 3 x 2 cm involving the basal ganglia with midline shift and edema seen on CT head.  IV antibiotics including vancomycin, rocephin, and flagyl was started.  He was also started on keppra 500mg  BID.  Of note, he had routine dental work 2 weeks prior to symptom onset.  He has completed his course of antibiotics therapy with IV for 6 weeks and transitioned to oral levofloxacin and metronidazole for 4 weeks.  Starting ~ late July 2016, he started  noticing numbness/tingling of the toes and shooting pain involving the sole of the foot.  Symptoms are constant and involve both feet equally.  Shooting pain is worse with weight bearing.  Nothing alleviates the pain.  He endorses mild low back pain.  These symptoms were thought to be due to flagyl-induced neuropathy and slowly improved.   UPDATE 04/06/2021: He continues to have issues with imbalance, especially with turning.  Fortunately, he has not had any falls. He continues to walk unassisted.  He also has problems with remembering names.  No other memory issues.  He manages finances, medication, drives, and household chores.  Sometimes, he feels that his speech may mumble, especially if he talks fast.  No slurred speech. Last summer, he has a syncopal event at church, thought to be medication-induced.  He has not had any other events like this.  Numbness in the feet is stable and unchanged.  UPDATE 08/04/2022: He is here for follow-up visit.  He reports having a spell of confusion following TURP on 03/02/2022 which lasted 2-3 hours.  During this time, he was confused and unable to think of what he wanted to say and get the words out.  No focal weakness.  CT head did not show acute stroke.  EEG was normal.  BP has been elevated and being monitored by PCP.  He has not had this recur.  He continues to be forgetful with tasks and tends to make a lot of notes, but able to manage all IADLs.  His neuropathy is stable with numbness restricted to the feet.  Balance continues to be a problem.  He has one fall earlier this year from loosing his balance over a threshold. He walks unassisted.  He was going to start PT but this has been delayed.   UPDATE 07/18/2023:  He is here for 1 year follow-up visit.  He reports noticing that his balance is getting worse.  He has one fall when at home last week and another one earlier this year.  Fortunately, he did not suffer any injuries.  He also has neck pain and stiffness.  No  new weakness in the arms or leg.  His neuropathy is unchanged and continues to have numbness/tingling in the feet.  He takes gabapentin 100mg  BID. He was hospitalized in January with RSV and was on oxygen until last month.    UPDATE 01/31/2024:  He is here for sooner than scheduled visit due to spells of syncope.  He reports having three episodes of syncope in the past 5 years and his last occurred on 1/31.  He reports having severe epistaxis with blood flowing out of his nose, which he attempted to control for about 3-4 hours.  He contacted his ENT who advised him to go to the ER.  However, moments after this call, he became lightheaded, disorientated, clammy, and lost consciousness.  EMS was called and BP was 68/40. Duration was 15-20 minutes. By the time, he was in the ambulance, he was getting back to his baseline.  There was no associated jerking, tongue biting, or incontinence.   He has three prior spells, the last occurred when his wife was going to the hospital.  Medications:  Current Outpatient Medications on File Prior to Visit  Medication Sig Dispense Refill   acetaminophen (TYLENOL) 325 MG tablet Take 2 tablets (650 mg total) by mouth every 6 (six) hours as needed for mild pain (or Fever >/= 101).     Ascorbic Acid (VITAMIN C) 1000 MG tablet Take 500 mg by mouth daily.     budesonide (PULMICORT) 0.5 MG/2ML nebulizer solution Take 2 mLs (0.5 mg total) by nebulization at bedtime. 60 mL 11   Cholecalciferol 125 MCG (5000 UT) capsule Take 5,000 Units by mouth daily.     docusate sodium (COLACE) 100 MG capsule Take 200 mg by mouth at bedtime.     ezetimibe (ZETIA) 10 MG tablet Take 1 tablet (10 mg total) by mouth daily. 90 tablet 1   gabapentin (NEURONTIN) 100 MG capsule TAKE 1 CAPSULE (100 MG TOTAL) BY MOUTH 2 (TWO) TIMES DAILY. 180 capsule 3   Iron, Ferrous Sulfate, 325 (65 Fe) MG TABS Take 325 mg by mouth in the morning, at noon, and at bedtime. 30 tablet 2   levocetirizine (XYZAL) 5 MG  tablet Take 1 tablet (5 mg total) by mouth every evening. 90 tablet 1   levothyroxine (SYNTHROID) 150 MCG tablet Take 1 tablet (150 mcg total) by mouth daily before breakfast. 90 tablet 3   magic mouthwash (nystatin, hydrocortisone, diphenhydrAMINE) suspension Swish and swallow 5 mLs 3 (three) times daily as needed (mouth and throat irritation). 480 mL 3   Magnesium 250 MG TABS Take 250 mg by mouth at bedtime.      meclizine (ANTIVERT) 25 MG tablet TAKE 1 TABLET (25 MG TOTAL) BY MOUTH 2 (TWO) TIMES DAILY AS NEEDED FOR DIZZINESS. 30 tablet 5   methocarbamol (ROBAXIN) 500 MG tablet Take 1 tablet (500 mg total) by mouth every 8 (eight) hours as needed for muscle spasms. 270 tablet 0   montelukast (SINGULAIR) 10 MG tablet TAKE 1 TABLET  BY MOUTH AT  BEDTIME 90 tablet 3   Multiple Vitamin (MULTIVITAMIN WITH MINERALS) TABS tablet Take 1 tablet by mouth every morning.     mupirocin ointment (BACTROBAN) 2 % Apply 1 Application topically 2 (two) times daily. To affected area. Patient will call when ready to pick up. 30 g 0   pantoprazole (PROTONIX) 40 MG tablet TAKE 1 TABLET BY MOUTH TWICE  DAILY 180 tablet 3   silodosin (RAPAFLO) 8 MG CAPS capsule TAKE 1 CAPSULE BY MOUTH AT BEDTIME 90 capsule 1   sodium chloride 1 g tablet Take 1 tablet (1 g total) by mouth 2 (two) times daily with a meal. 180 tablet 3   HYDROcodone bit-homatropine (HYCODAN) 5-1.5 MG/5ML syrup Take 5 mLs by mouth every 8 (eight) hours as needed for cough. (Patient not taking: Reported on 01/31/2024) 120 mL 0   No current facility-administered medications on file prior to visit.    Allergies:  Allergies  Allergen Reactions   Nsaids Other (See Comments)    Cannot take-per MD   Other Other (See Comments)    All blood thinners-cannot take per MD   Aspirin Other (See Comments)    nosebleeds   Statins Other (See Comments)    Weakness, myalgias   Demeclocycline Other (See Comments)    Passed out   Dust Mite Extract Other (See Comments)    Tizanidine Hcl Other (See Comments)    Patient stated that after taking this medication he experienced feelings of dizziness and disorientation.      Vital Signs:  BP (!) 142/59   Pulse 83   Ht 5\' 10"  (1.778 m)   Wt 187 lb (84.8 kg)   SpO2 94%   BMI 26.83 kg/m   Neurological Exam: MENTAL STATUS including orientation to time, place, person, recent and remote memory, attention span and concentration, language, and fund of knowledge is normal.  Speech is not dysarthric.  CRANIAL NERVES:  Normal fundoscopic exam.  Pupils equal round and reactive to light.  Normal conjugate, extra-ocular eye movements in all directions of gaze.  Right ptosis (old).  Face is symmetric and muscles are intact. Palate elevates symmetrically.  Tongue is midline.  MOTOR:  Motor strength is 5/5 in all extremities.   No pronator drift.  There is mild spasticity in the RUE and RLE (0+)  MSRs:  Reflexes are 3+/4 throughout, brisker on the right side.  SENSORY:  Vibration is reduced at the ankles, intact at the knees.    COORDINATION/GAIT:  Normal finger-to- nose-finger. Intact rapid alternating movements bilaterally.  Gait mildly wide-based, unsteady slightly with turns, unassisted.  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  Stable 4 mm LEFT-to-RIGHT midline shift.   CT head 06/24/2015:  Significant interval improvement when compared to the 05/13/2015 examination.  The ring-enhancing lesion which was centered in the left basal ganglia has decreased significantly in size. Currently, 2.3 x 0.9 cm region of relatively solid enhancement superior aspect of the left basal ganglia remains as versus prior 3.1 x 1.9 cm ring-enhancing lesion. Significant decrease in  surrounding vasogenic edema and marked decrease mass effect upon the left lateral ventricle. What remains may represent residua of treated abscess. This will need to be followup until complete clearance. As this clears it would be helpful to exclude the possibility of underlying  vascular abnormality in this patient with Osler-Weber-Rendu.  CT head 08/11/2015: Resolving abscess left external capsule. There is decreased edema and decreased enhancement in this area. No residual fluid collection. No other areas of acute abnormality.  CT head wwo contrast 01/06/2016:  Hypoattenuation in the LEFT basal ganglia and regional white matter is improved from August but not yet normalized. No abnormal postcontrast enhancement to definitively suggest residual infection. Continued surveillance may be warranted, given the patient's residual RIGHT leg weakness and intermittent slurred speech.   NCS/EMG of the legs 09/16/2015: The electrophysiologic findings are most consistent with a generalized sensorimotor polyneuropathy, axon loss in type, affecting the lower extremities. Overall, these findings are severe in degree electrically with respect to sensory responses. 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  Labs 09/11/2015:  vitamin B12 874, folate 24.8, vitamin B1 36, copper 77, vitamin B6 73.8, myasthenia panel negative  CT head 01/21/2017:  No change from the prior CT. Negative for abscess or mass lesion. Chronic encephalomalacia left external capsule related to prior cerebral abscess.  Routine 03/15/2022:  Normal   CT head 03/03/2022: 1.  No acute intracranial hemorrhage.  2.  No evidence of acute large vascular territory infarct,  large vessel occlusion, or cerebral perfusion disturbance. Because CT is relatively insensitive for the detection of ischemia within the first 24 hours, if high clinical suspicion remains for acute infarction, MRI would be  recommended for further evaluation.  3.  No 50% or greater arterial stenosis within the head or neck.  4.  Slight asymmetric hypoattenuation in the left frontal white matter extending towards prior left frontal burr hole, favored to reflect chronic changes related to prior abscess. If high clinical suspicion remains for acute infarction, MRI would be recommended for further evaluation.  5.  Background of chronic small vessel disease and generalized cerebral and cerebellar atrophy.   CT head wo contrast 09/24/2023:     Thank you for allowing me to participate in patient's care.  If I can answer any additional questions, I would be pleased to do so.    Sincerely,    Toini Failla K. Allena Katz, DO

## 2024-02-01 ENCOUNTER — Telehealth: Payer: Self-pay | Admitting: *Deleted

## 2024-02-01 ENCOUNTER — Encounter: Payer: Self-pay | Admitting: *Deleted

## 2024-02-01 NOTE — Telephone Encounter (Signed)
Notified Gregory [name] that Hgb is normal, but iron level is still low. He has been taking his oral iron tid and will continue to do so. Agrees to repeat labs in April and knows MD may order IV iron if ferritin is lower. He will call with any s/s of needing transfusion or intervention.

## 2024-02-01 NOTE — Telephone Encounter (Signed)
-----   Message from Thornton Papas sent at 01/31/2024  5:34 PM EST ----- Please call patient, the hemoglobin is normal, but the iron level is low, continue oral iron, call for symptoms of anemia, follow-up as scheduled, check ferritin and CBC next visit, we will administer IV iron if the ferritin is lower

## 2024-02-03 ENCOUNTER — Ambulatory Visit: Payer: Medicare Other | Attending: Physician Assistant | Admitting: Physician Assistant

## 2024-02-03 ENCOUNTER — Encounter: Payer: Self-pay | Admitting: Physician Assistant

## 2024-02-03 VITALS — BP 120/68 | HR 66 | Ht 70.0 in | Wt 188.0 lb

## 2024-02-03 DIAGNOSIS — R55 Syncope and collapse: Secondary | ICD-10-CM | POA: Insufficient documentation

## 2024-02-03 NOTE — Progress Notes (Signed)
Cardiology Office Note:  .   Date:  02/05/2024  ID:  Gregory [name], DOB 11/18/1941, MRN [id number] PCP: Tollie Eth, NP  [facility] Providers Cardiologist:  Olga Millers, MD     History of Present Illness: Gregory [name] is a 83 y.o. 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  Patient was admitted in May 2016 with headache and the right lower extremity weakness.  CT of the head demonstrated appearing mass that measures 3 x 2 cm with midline shift and edema.  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  He underwent stereotactic biopsy by neurosurgery.  He was diagnosed with brain abscess.  Culture grew Peptostreptococcus species.  He was followed by infectious disease and treated with vancomycin, Rocephin and Flagyl.  He was placed on Keppra for seizure prophylaxis.  It was felt that his Alphonse Guild Rendu syndrome predisposed him to this brain abscess.  Following discharge, he had a episode of elevated heart rate.  EMS was called and he was noted to be in atrial flutter.  This converted to sinus rhythm spontaneously.  He was placed on metoprolol succinate.  Event monitor showed slow atrial flutter versus ectopic atrial tachycardia.  Heart monitor in July 2021 showed sinus bradycardia, normal sinus rhythm, sinus tachycardia.  He did have dizziness while the heart monitor was in place.  Carotid Doppler in August 2023 showed no significant stenosis.  Echocardiogram in January 2024 showed normal EF, mild LVH.  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  Patient was last seen by Dr. Jens Som in December 2020 for.  He had orthostatic hypotension, it was recommended to increase fluid and sodium intake.  More recently, patient has experienced  multiple recurrent episode of epistaxis along with significant blood loss, lightheadedness and syncope.  During one of the episode, when EMS arrived, his blood pressure was 86/40.  He has been seen by family medicine and neurology service.  Valsartan was stopped.  Patient has been encouraged to contact both neurology service and cardiology service.  Neurology service felt syncopal episode was likely vasovagal in nature.  It was offered for him to undergo EEG, however patient like to hold off.  Patient presents today for evaluation of syncope.  He says unlike the previous time, this episode he did not proceed by any flushing sensation.  He had 1.5 hours of nosebleed that day prior to passing out spell.  His blood pressure afterward has normalized.  His blood pressure is normal even after the discontinuation of valsartan.  He has no lower extremity edema, orthopnea or PND.  He does not have any intermittent dizziness.  Echocardiogram was performed on 01/31/2024 that showed EF 60 to 65%, no regional wall motion abnormality, grade 1 DD, mild MR, mild to moderate TR.  I recommended a 2-week heart monitor, if negative, would not pursue any further workup.  It is possible that patient had a vasovagal episode that led to his syncope.  ROS:   He denies chest pain, palpitations, dyspnea, pnd, orthopnea, n, v, dizziness, syncope, edema, weight gain, or early satiety. All other systems reviewed and are otherwise negative except as noted above.    Studies Reviewed: .        Cardiac Studies & Procedures   ______________________________________________________________________________________________     ECHOCARDIOGRAM  ECHOCARDIOGRAM COMPLETE 01/31/2024  Narrative ECHOCARDIOGRAM REPORT    Patient Name:   Gregory [name] Date of Exam: 01/31/2024 Medical Rec #:  [id number]        Height:       70.0 in Accession #:    [id number]       Weight:       187.0 lb Date of Birth:  1941/03/24       BSA:          2.029  m Patient Age:    82 years         BP:           136/68 mmHg Patient Gender: M                HR:           73 bpm. Exam Location:  [address]  Procedure: 2D Echo, Cardiac Doppler, Color Doppler, 3D Echo and Strain Analysis  Indications:    R55 Syncope  History:        Patient has prior history of Echocardiogram examinations, most recent 12/29/2022. Signs/Symptoms:Syncope; Risk Factors:Hypertension, Dyslipidemia and Diabetes.  Sonographer:    Samule Ohm RDCS Referring Phys: [id number] BRIAN S CRENSHAW  IMPRESSIONS   1. Left ventricular ejection fraction, by estimation, is 60 to 65%. The left ventricle has normal function. The left ventricle has no regional wall motion abnormalities. There is mild concentric left ventricular hypertrophy. Left ventricular diastolic parameters are consistent with Grade I diastolic dysfunction (impaired relaxation). 2. Right ventricular systolic function is normal. The right ventricular size is mildly enlarged. 3. Left atrial size was mildly dilated. 4. The mitral valve is grossly normal. Mild mitral valve regurgitation. 5. Tricuspid valve regurgitation is mild to moderate. 6. The aortic valve is tricuspid. Aortic valve regurgitation is not visualized. 7. The inferior vena cava is normal in size with greater than 50% respiratory variability, suggesting right atrial pressure of 3 mmHg.  FINDINGS Left Ventricle: Left ventricular ejection fraction, by estimation, is 60 to 65%. The left ventricle has normal function. The left ventricle has no regional wall motion abnormalities. The left ventricular internal cavity size was normal in size. There is mild concentric left ventricular hypertrophy. Left ventricular diastolic parameters are consistent with Grade I diastolic dysfunction (impaired relaxation).  Right Ventricle: The right ventricular size is mildly enlarged. Right ventricular systolic function is normal.  Left Atrium: Left atrial size was mildly  dilated.  Right Atrium: Right atrial size was normal in size.  Pericardium: There is no evidence of pericardial effusion.  Mitral Valve: The mitral valve is grossly normal. Mild mitral valve regurgitation.  Tricuspid Valve: Tricuspid valve regurgitation is mild to moderate.  Aortic Valve: The aortic valve is tricuspid. Aortic valve regurgitation is not visualized.  Pulmonic Valve: Pulmonic valve regurgitation is not visualized.  Aorta: The aortic root and ascending aorta are structurally normal, with no evidence of dilitation.  Venous: The inferior vena cava is normal in size with greater than 50% respiratory variability, suggesting right atrial pressure of 3 mmHg.  IAS/Shunts: No atrial level shunt detected by color flow Doppler.   LEFT VENTRICLE PLAX 2D LVIDd:         4.80 cm   Diastology LVIDs:         3.10 cm   LV e' medial:    8.16 cm/s LV PW:         1.30 cm   LV E/e' medial:  9.8 LV IVS:  1.20 cm   LV e' lateral:   9.90 cm/s LVOT diam:     2.00 cm   LV E/e' lateral: 8.1 LV SV:         91 LV SV Index:   45        2D Longitudinal Strain LVOT Area:     3.14 cm  2D Strain GLS (A2C):   18.1 % 2D Strain GLS (A3C):   19.2 % 2D Strain GLS (A4C):   16.0 % 2D Strain GLS Avg:     17.8 %  3D Volume EF: 3D EF:        58 % LV EDV:       172 ml LV ESV:       72 ml LV SV:        100 ml  RIGHT VENTRICLE             IVC RV S prime:     12.00 cm/s  IVC diam: 1.20 cm TAPSE (M-mode): 2.2 cm RVSP:           27.0 mmHg  LEFT ATRIUM           Index        RIGHT ATRIUM           Index LA diam:      4.40 cm 2.17 cm/m   RA Pressure: 3.00 mmHg LA Vol (A2C): 68.2 ml 33.62 ml/m  RA Area:     13.40 cm LA Vol (A4C): 64.9 ml 31.99 ml/m  RA Volume:   38.00 ml  18.73 ml/m AORTIC VALVE LVOT Vmax:   112.00 cm/s LVOT Vmean:  72.900 cm/s LVOT VTI:    0.289 m  AORTA Ao Root diam: 3.60 cm Ao Asc diam:  3.10 cm  MITRAL VALVE               TRICUSPID VALVE MV Area (PHT): 2.66 cm     TR Peak grad:   24.0 mmHg MV Decel Time: 285 msec    TR Vmax:        245.00 cm/s MV E velocity: 79.70 cm/s  Estimated RAP:  3.00 mmHg MV A velocity: 87.50 cm/s  RVSP:           27.0 mmHg MV E/A ratio:  0.91 SHUNTS Systemic VTI:  0.29 m Systemic Diam: 2.00 cm  Carolan Clines Electronically signed by Carolan Clines Signature Date/Time: 01/31/2024/4:49:16 PM    Final    MONITORS  CARDIAC EVENT MONITOR 07/17/2020  Narrative Sinus bradycardia, normal sinus rhythm, sinus tachycardia. Olga Millers       ______________________________________________________________________________________________      Risk Assessment/Calculations:            Physical Exam:   VS:  BP 120/68 (BP Location: Left Arm, Patient Position: Sitting, Cuff Size: Normal)   Pulse 66   Ht 5\' 10"  (1.778 m)   Wt 188 lb (85.3 kg)   BMI 26.98 kg/m    Wt Readings from Last 3 Encounters:  02/03/24 188 lb (85.3 kg)  01/31/24 187 lb (84.8 kg)  01/26/24 186 lb 9.6 oz (84.6 kg)    GEN: Well nourished, well developed in no acute distress NECK: No JVD; No carotid bruits CARDIAC: RRR, no murmurs, rubs, gallops RESPIRATORY:  Clear to auscultation without rales, wheezing or rhonchi  ABDOMEN: Soft, non-tender, non-distended EXTREMITIES:  No edema; No deformity   ASSESSMENT AND PLAN: .    Syncope: Patient has a prior history of syncope.  Recently, there  was only one episode.  This occurred after extensive nosebleed.  Although previous episode has always been preceded by a flushing sensation, this did not occur at this time.  His valsartan has been discontinued afterward.  Blood pressure is normal despite discontinuation of valsartan.  Echocardiogram obtained on 01/31/2024 showed normal EF, no wall motion abnormality, no significant valve issue.  I recommended a heart monitor, if negative, would not pursue further workup.       Dispo: Follow-up in 6 weeks  Signed, Azalee Course, PA

## 2024-02-03 NOTE — Patient Instructions (Signed)
Medication Instructions:  NO CHANGES *If you need a refill on your cardiac medications before your next appointment, please call your pharmacy*   Lab Work: NO LABS If you have labs (blood work) drawn today and your tests are completely normal, you will receive your results only by: MyChart Message (if you have MyChart) OR A paper copy in the mail If you have any lab test that is abnormal or we need to change your treatment, we will call you to review the results.   Testing/Procedures: ZIO AT Long term monitor-Live Telemetry  Your physician has requested you wear a ZIO patch monitor for 14 days.  This is a single patch monitor. [employer] supplies one patch monitor per enrollment. Additional  stickers are not available.  Please do not apply patch if you will be having a Nuclear Stress Test, Echocardiogram, Cardiac CT, MRI,  or Chest Xray during the period you would be wearing the monitor. The patch cannot be worn during  these tests. You cannot remove and re-apply the ZIO AT patch monitor.  Your ZIO patch monitor will be mailed 3 day USPS to your address on file. It may take 3-5 days to  receive your monitor after you have been enrolled.  Once you have received your monitor, please review the enclosed instructions. Your monitor has  already been registered assigning a specific monitor serial # to you.   Billing and Patient Assistance Program information  Meredeth Ide has been supplied with any insurance information on record for billing. [employer] offers a sliding scale Patient Assistance Program for patients without insurance, or whose  insurance does not completely cover the cost of the ZIO patch monitor. You must apply for the  Patient Assistance Program to qualify for the discounted rate. To apply, call [employer] at [phone],  select option 4, select option 2 , ask to apply for the Patient Assistance Program, (you can request an  interpreter if needed). [employer] will ask your household  income and how many people are in your  household. [employer] will quote your out-of-pocket cost based on this information. They will also be able  to set up a 12 month interest free payment plan if needed.  Applying the monitor   Shave hair from upper left chest.  Hold the abrader disc by orange tab. Rub the abrader in 40 strokes over left upper chest as indicated in  your monitor instructions.  Clean area with 4 enclosed alcohol pads. Use all pads to ensure the area is cleaned thoroughly. Let  dry.  Apply patch as indicated in monitor instructions. Patch will be placed under collarbone on left side of  chest with arrow pointing upward.  Rub patch adhesive wings for 2 minutes. Remove the white label marked "1". Remove the white label  marked "2". Rub patch adhesive wings for 2 additional minutes.  While looking in a mirror, press and release button in center of patch. A small green light will flash 3-4  times. This will be your only indicator that the monitor has been turned on.  Do not shower for the first 24 hours. You may shower after the first 24 hours.  Press the button if you feel a symptom. You will hear a small click. Record Date, Time and Symptom in  the Patient Log.   Starting the Gateway  In your kit there is a Audiological scientist box the size of a cellphone. This is Buyer, retail. It transmits all your  recorded data to [facility]. This box must  always stay within 10 feet of you. Open the box and push the *  button. There will be a light that blinks orange and then green a few times. When the light stops  blinking, the Gateway is connected to the ZIO patch. Call [employer] at [phone] to confirm your monitor is transmitting.  Returning your monitor  Remove your patch and place it inside the Gateway. In the lower half of the Gateway there is a white  bag with prepaid postage on it. Place Gateway in bag and seal. Mail package back to [place] as soon as  possible. Your physician should  have your final report approximately 7 days after you have mailed back  your monitor. Call [facility] Customer Care at [phone] if you have questions regarding your ZIO AT  patch monitor. Call them immediately if you see an orange light blinking on your monitor.  If your monitor falls off in less than 4 days, contact our Monitor department at [phone]. If your  monitor becomes loose or falls off after 4 days call [employer] at [phone] for suggestions on  securing your monitor    Follow-Up: At [facility], you and your health needs are our priority.  As part of our continuing mission to provide you with exceptional heart care, we have created designated Provider Care Teams.  These Care Teams include your primary Cardiologist (physician) and Advanced Practice Providers (APPs -  Physician Assistants and Nurse Practitioners) who all work together to provide you with the care you need, when you need it.  Your next appointment:   6 week(s)  Provider:   Azalee Course, PA

## 2024-02-06 ENCOUNTER — Other Ambulatory Visit (INDEPENDENT_AMBULATORY_CARE_PROVIDER_SITE_OTHER): Payer: Medicare Other

## 2024-02-06 DIAGNOSIS — R55 Syncope and collapse: Secondary | ICD-10-CM

## 2024-02-06 LAB — COMPREHENSIVE METABOLIC PANEL
ALT: 17 [IU]/L (ref 0–44)
AST: 20 [IU]/L (ref 0–40)
Albumin: 4.3 g/dL (ref 3.7–4.7)
Alkaline Phosphatase: 87 [IU]/L (ref 44–121)
BUN/Creatinine Ratio: 9 — ABNORMAL LOW (ref 10–24)
BUN: 9 mg/dL (ref 8–27)
Bilirubin Total: 0.4 mg/dL (ref 0.0–1.2)
CO2: 24 mmol/L (ref 20–29)
Calcium: 9.4 mg/dL (ref 8.6–10.2)
Chloride: 96 mmol/L (ref 96–106)
Creatinine, Ser: 1.02 mg/dL (ref 0.76–1.27)
Globulin, Total: 2.2 g/dL (ref 1.5–4.5)
Glucose: 91 mg/dL (ref 70–99)
Potassium: 4.8 mmol/L (ref 3.5–5.2)
Sodium: 134 mmol/L (ref 134–144)
Total Protein: 6.5 g/dL (ref 6.0–8.5)
eGFR: 74 mL/min/{1.73_m2} (ref >59)

## 2024-02-06 LAB — CBC WITH DIFFERENTIAL/PLATELET
Basophils Absolute: 0.1 10*3/uL (ref 0.0–0.2)
Basos: 1 %
EOS (ABSOLUTE): 0.1 10*3/uL (ref 0.0–0.4)
Eos: 1 %
Hematocrit: 44.6 % (ref 37.5–51.0)
Hemoglobin: 14.8 g/dL (ref 13.0–17.7)
Immature Grans (Abs): 0 10*3/uL (ref 0.0–0.1)
Immature Granulocytes: 0 %
Lymphocytes Absolute: 2.1 10*3/uL (ref 0.7–3.1)
Lymphs: 31 %
MCH: 31 pg (ref 26.6–33.0)
MCHC: 33.2 g/dL (ref 31.5–35.7)
MCV: 93 fL (ref 79–97)
Monocytes Absolute: 0.9 10*3/uL (ref 0.1–0.9)
Monocytes: 12 %
Neutrophils Absolute: 3.8 10*3/uL (ref 1.4–7.0)
Neutrophils: 55 %
Platelets: 301 10*3/uL (ref 150–450)
RBC: 4.78 x10E6/uL (ref 4.14–5.80)
RDW: 13 % (ref 11.6–15.4)
WBC: 6.9 10*3/uL (ref 3.4–10.8)

## 2024-02-06 LAB — ALDOSTERONE + RENIN ACTIVITY W/ RATIO
Aldos/Renin Ratio: 6.8 (ref 0.0–30.0)
Aldosterone: 4.3 ng/dL (ref 0.0–30.0)
Renin Activity, Plasma: 0.635 ng/mL/h (ref 0.167–5.380)

## 2024-02-06 LAB — IRON,TIBC AND FERRITIN PANEL
Ferritin: 48 ng/mL (ref 30–400)
Iron Saturation: 18 % (ref 15–55)
Iron: 56 ug/dL (ref 38–169)
Total Iron Binding Capacity: 318 ug/dL (ref 250–450)
UIBC: 262 ug/dL (ref 111–343)

## 2024-02-06 LAB — HEMOGLOBIN A1C
Est. average glucose Bld gHb Est-mCnc: 117 mg/dL
Hgb A1c MFr Bld: 5.7 % — ABNORMAL HIGH (ref 4.8–5.6)

## 2024-02-06 LAB — CORTISOL: Cortisol: 9.1 ug/dL (ref 6.2–19.4)

## 2024-02-06 LAB — MICROALBUMIN / CREATININE URINE RATIO

## 2024-02-06 LAB — ACTH: ACTH: 18.1 pg/mL (ref 7.2–63.3)

## 2024-02-06 NOTE — Progress Notes (Unsigned)
Enrolled for [employer] to mail a ZIO AT Live Telemetry monitor to patients address on file.  Dr. Crenshaw to read.  

## 2024-02-08 ENCOUNTER — Encounter: Payer: Self-pay | Admitting: Nurse Practitioner

## 2024-02-11 DIAGNOSIS — R55 Syncope and collapse: Secondary | ICD-10-CM

## 2024-02-15 ENCOUNTER — Ambulatory Visit (INDEPENDENT_AMBULATORY_CARE_PROVIDER_SITE_OTHER): Payer: Medicare Other | Admitting: Pulmonary Disease

## 2024-02-15 ENCOUNTER — Encounter: Payer: Self-pay | Admitting: Pulmonary Disease

## 2024-02-15 VITALS — BP 108/58 | HR 71 | Temp 97.6°F | Ht 70.0 in | Wt 186.8 lb

## 2024-02-15 DIAGNOSIS — J849 Interstitial pulmonary disease, unspecified: Secondary | ICD-10-CM

## 2024-02-15 DIAGNOSIS — R053 Chronic cough: Secondary | ICD-10-CM | POA: Diagnosis not present

## 2024-02-15 MED ORDER — BUDESONIDE 0.5 MG/2ML IN SUSP: 0.5000 mg | Freq: Every day | RESPIRATORY_TRACT | 11 refills | Status: DC

## 2024-02-15 NOTE — Progress Notes (Signed)
 @Patient  ID: Gregory [name], male    DOB: 10/16/1941, 83 y.o.   MRN: [id number]  Chief Complaint  Patient presents with   Follow-up    Cough persistent.    Referring provider: Tollie Eth, NP  HPI:   83 y.o. 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  Most recent cardiology note reviewed.  Most recent PCP note reviewed.  Overall doing okay.  Budesonide prescribed at night given nocturnal cough.  Nocturnal cough a bit better.  Coughing during the day as well.  Using Delsym, Robitussin at least daily.  Some congestion he feels in his chest.  Recurrent nosebleeds discussed.  Most recent episode quite severe.  Ended up having out episode of syncope and around to the ED.  Was cauterized.  Ongoing workup with cardiology, neurology.  Wearing a Zio patch today.  HPI at initial visit: Patient has chronic cough over the last 2 years.  When asked in more detail he admits cough is been present for well over a decade.  Unclear how much change there has been since last seen by pulmonary some years ago.  Does seem that the frequency has worsened over the last couple of years.  Worse at night but occurs throughout the day.  It is nonproductive.  Uses a variety of dextromethorphan containing products that do help, he rotates through these 2 or 3 products as they seem to wear off and affect.  He is currently on Breo does not seem to help.  Recently resumed gabapentin at low-dose, 100 mg twice daily with mild improvement.  Notes in the past he was on 200 mg twice daily of gabapentin with significant improvement however he had syncope in the setting of what sounds like polypharmacy.  Prior pulmonary notes indicate that he is cough improved on Dulera.  He identifies no other alleviating or exacerbating factors.  No environmental or seasonal factors that contribute to better or worse cough.  No position where things are better or worse.  He is on  a PPI twice daily for a long history of GERD.  Does endorse occasional breakthrough symptoms of heartburn, reflux.  Reports endoscopy, manometry, pH probe in the past although these results are all available for review and he does not recall the results.  Reviewed serial CT scans dating back to 2016, with persistent right lower lobe large AVM and small left lower lobe AVM stable from scan 04/2015, 09/2018, 11/2018, 11/2020 with otherwise clear lungs on my interpretation.  PMH: HHT with pulmonary AVMs, hypertension, GERD Surgical history: Brain surgery for brain abscess, Family history: Mother with diabetes, hypertension, father with kidney failure Social history: Lives in [place], retired, went to [employer], has 3 grandchildren  Public house manager / Pulmonary Flowsheets:   ACT:      No data to display          MMRC:     No data to display          Epworth:      No data to display          Tests:   FENO:  No results found for: "NITRICOXIDE"  PFT:    Latest Ref Rng & Units 07/31/2021   10:49 AM  PFT Results  FVC-Pre L 2.90   FVC-Predicted Pre % 72   FVC-Post L 2.81   FVC-Predicted Post % 69   Pre FEV1/FVC % % 81   Post  FEV1/FCV % % 84   FEV1-Pre L 2.34   FEV1-Predicted Pre % 81   FEV1-Post L 2.38   DLCO uncorrected ml/min/mmHg 10.47   DLCO UNC% % 43   DLCO corrected ml/min/mmHg 10.47   DLCO COR %Predicted % 43   DLVA Predicted % 57   TLC L 5.07   TLC % Predicted % 72   RV % Predicted % 85   Personally reviewed and interpreted as normal spirometry, mildly reduced TLC/mild restriction, severely reduced DLCO.  WALK:     04/18/2023    2:16 PM 01/20/2023   11:45 AM 01/06/2023   11:53 AM  SIX MIN WALK  Supplimental Oxygen during Test? (L/min) No Yes Yes  O2 Flow Rate  3 L/min 5 L/min  Type  Continuous Continuous  Tech Comments: patient ambulated on RA at a moderate, steady pace with no stops, no complaints of sob. Patient O2 sats monitored on head probe during  walk.  rest and lap #1 were on room air, provided 5L O2 and sats went up   Imaging: Personally reviewed and as per EMR  Lab Results: Personally reviewed CBC    Component Value Date/Time   WBC 8.5 01/31/2024 1022   WBC 10.7 (H) 01/19/2024 0228   RBC 4.45 01/31/2024 1022   HGB 13.8 01/31/2024 1022   HGB 14.8 01/27/2024 0848   HCT 40.1 01/31/2024 1022   HCT 44.6 01/27/2024 0848   PLT 266 01/31/2024 1022   PLT 301 01/27/2024 0848   MCV 90.1 01/31/2024 1022   MCV 93 01/27/2024 0848   MCH 31.0 01/31/2024 1022   MCHC 34.4 01/31/2024 1022   RDW 14.0 01/31/2024 1022   RDW 13.0 01/27/2024 0848   LYMPHSABS 2.7 01/31/2024 1022   LYMPHSABS 2.1 01/27/2024 0848   MONOABS 1.0 01/31/2024 1022   EOSABS 0.1 01/31/2024 1022   EOSABS 0.1 01/27/2024 0848   BASOSABS 0.1 01/31/2024 1022   BASOSABS 0.1 01/27/2024 0848    BMET    Component Value Date/Time   NA 134 01/27/2024 0848   K 4.8 01/27/2024 0848   CL 96 01/27/2024 0848   CO2 24 01/27/2024 0848   GLUCOSE 91 01/27/2024 0848   GLUCOSE 114 (H) 01/19/2024 0228   BUN 9 01/27/2024 0848   CREATININE 1.02 01/27/2024 0848   CALCIUM 9.4 01/27/2024 0848   GFRNONAA >60 01/19/2024 0228   GFRAA >60 06/01/2020 1106    BNP    Component Value Date/Time   BNP 37.1 12/30/2022 0548    ProBNP    Component Value Date/Time   PROBNP 126.0 (H) 03/19/2021 0959    Specialty Problems       Pulmonary Problems   Seasonal allergic rhinitis due to pollen   Laryngopharyngeal reflux (LPR)   Interstitial lung disease (HCC)   Influenza A    Allergies  Allergen Reactions   Nsaids Other (See Comments)    Cannot take-per MD   Other Other (See Comments)    All blood thinners-cannot take per MD   Aspirin Other (See Comments)    nosebleeds   Statins Other (See Comments)    Weakness, myalgias   Demeclocycline Other (See Comments)    Passed out   Dust Mite Extract Other (See Comments)   Tizanidine Hcl Other (See Comments)    Patient stated  that after taking this medication he experienced feelings of dizziness and disorientation.     Immunization History  Administered Date(s) Administered   Fluad Quad(high Dose 65+) 08/24/2019, 10/02/2020, 09/11/2021  Fluad Trivalent(High Dose 65+) 09/13/2023   Influenza Split 09/21/2011, 09/27/2012   Influenza Whole 09/11/2008, 09/11/2009, 09/22/2010   Influenza, High Dose Seasonal PF 09/09/2016, 09/16/2017, 10/03/2018   Influenza, Quadrivalent, Recombinant, Inj, Pf 08/20/2022   Influenza,inj,Quad PF,6+ Mos 10/04/2013, 10/08/2014, 10/02/2015   PFIZER Comirnaty(Gray Top)Covid-19 Tri-Sucrose Vaccine 06/08/2021   PFIZER(Purple Top)SARS-COV-2 Vaccination 01/03/2020, 01/23/2020, 10/18/2020   PNEUMOCOCCAL CONJUGATE-20 04/26/2022   Pfizer Covid-19 Vaccine Bivalent Booster 71yrs & up 10/21/2021   Pfizer(Comirnaty)Fall Seasonal Vaccine 12 years and older 10/06/2022, 08/30/2023   Pneumococcal Conjugate-13 12/07/2016   Pneumococcal Polysaccharide-23 03/12/2010, 06/27/2018   Respiratory Syncytial Virus Vaccine,Recomb Aduvanted(Arexvy) 04/19/2023   Tdap 10/04/2013, 10/06/2023   Varicella 12/20/2009   Zoster Recombinant(Shingrix) 06/24/2022, 10/25/2022   Zoster, Live 09/06/2022    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  WITHOUT HEMOPTYSIS   History of nonmelanoma skin cancer EXCISION SQUAMOUS CELL FROM HAND   Hospital discharge follow-up  01/18/2023   Hypertrophy of prostate with urinary obstruction and other lower urinary tract symptoms (LUTS)    ILD (interstitial lung disease) (HCC)    Iron deficiency anemia due to chronic blood loss 10/08/2014   Irritable bowel syndrome    Laceration of arm, right, multiple sites, initial encounter 10/06/2023   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   Nocturnal leg cramps 06/11/2013   On home O2 01/18/2023   Pain in both lower extremities 10/28/2022   Plantar fascial fibromatosis    Pressure ulcer caused by device 01/07/2023   Pure hypercholesterolemia    RSV (respiratory syncytial virus pneumonia) 01/07/2023   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  Added automatically from request for surgery [id number]   Vaccine counseling 01/24/2023    Tobacco History: Social History   Tobacco Use  Smoking Status Never  Smokeless Tobacco Never   Counseling given: Not Answered   Continue to not smoke  Outpatient Encounter Medications as of 02/15/2024  Medication Sig   acetaminophen (TYLENOL) 325 MG tablet Take 2 tablets (650 mg total) by mouth every 6 (six) hours as needed for mild pain (or Fever >/= 101).   Ascorbic Acid (VITAMIN C) 1000 MG tablet Take 500 mg by mouth daily.   baclofen (LIORESAL) 10 MG tablet Take half tablet at bedtime x 1 week, then increase to half tablet twice daily.   Cholecalciferol 125 MCG (5000 UT) capsule Take 5,000 Units by mouth daily.   docusate sodium (COLACE) 100 MG capsule Take 200 mg by mouth at bedtime.   ezetimibe (ZETIA) 10 MG  tablet Take 1 tablet (10 mg total) by mouth daily.  gabapentin (NEURONTIN) 100 MG capsule TAKE 1 CAPSULE (100 MG TOTAL) BY MOUTH 2 (TWO) TIMES DAILY.   HYDROcodone bit-homatropine (HYCODAN) 5-1.5 MG/5ML syrup Take 5 mLs by mouth every 8 (eight) hours as needed for cough.   Iron, Ferrous Sulfate, 325 (65 Fe) MG TABS Take 325 mg by mouth in the morning, at noon, and at bedtime.   levocetirizine (XYZAL) 5 MG tablet Take 1 tablet (5 mg total) by mouth every evening.   levothyroxine (SYNTHROID) 150 MCG tablet Take 1 tablet (150 mcg total) by mouth daily before breakfast.   magic mouthwash (nystatin, hydrocortisone, diphenhydrAMINE) suspension Swish and swallow 5 mLs 3 (three) times daily as needed (mouth and throat irritation).   Magnesium 250 MG TABS Take 250 mg by mouth at bedtime.    meclizine (ANTIVERT) 25 MG tablet TAKE 1 TABLET (25 MG TOTAL) BY MOUTH 2 (TWO) TIMES DAILY AS NEEDED FOR DIZZINESS.   methocarbamol (ROBAXIN) 500 MG tablet Take 1 tablet (500 mg total) by mouth every 8 (eight) hours as needed for muscle spasms.   montelukast (SINGULAIR) 10 MG tablet TAKE 1 TABLET BY MOUTH AT  BEDTIME   Multiple Vitamin (MULTIVITAMIN WITH MINERALS) TABS tablet Take 1 tablet by mouth every morning.   mupirocin ointment (BACTROBAN) 2 % Apply 1 Application topically 2 (two) times daily. To affected area. Patient will call when ready to pick up.   pantoprazole (PROTONIX) 40 MG tablet TAKE 1 TABLET BY MOUTH TWICE  DAILY   silodosin (RAPAFLO) 8 MG CAPS capsule TAKE 1 CAPSULE BY MOUTH AT BEDTIME   sodium chloride 1 g tablet Take 1 tablet (1 g total) by mouth 2 (two) times daily with a meal.   budesonide (PULMICORT) 0.5 MG/2ML nebulizer solution Take 2 mLs (0.5 mg total) by nebulization at bedtime.   [DISCONTINUED] budesonide (PULMICORT) 0.5 MG/2ML nebulizer solution Take 2 mLs (0.5 mg total) by nebulization at bedtime. (Patient not taking: Reported on 02/15/2024)   No facility-administered encounter medications  on file as of 02/15/2024.     Review of Systems  Review of Systems  N/a Physical Exam  BP (!) 108/58 (BP Location: Left Arm, Patient Position: Sitting, Cuff Size: Normal)   Pulse 71   Temp 97.6 F (36.4 C) (Oral)   Ht 5\' 10"  (1.778 m)   Wt 186 lb 12.8 oz (84.7 kg)   SpO2 92%   BMI 26.80 kg/m   Wt Readings from Last 5 Encounters:  02/15/24 186 lb 12.8 oz (84.7 kg)  02/03/24 188 lb (85.3 kg)  01/31/24 187 lb (84.8 kg)  01/26/24 186 lb 9.6 oz (84.6 kg)  01/19/24 178 lb (80.7 kg)    BMI Readings from Last 5 Encounters:  02/15/24 26.80 kg/m  02/03/24 26.98 kg/m  01/31/24 26.83 kg/m  01/26/24 26.77 kg/m  01/19/24 25.54 kg/m     Physical Exam General: Well-appearing, in no acute distress Eyes: EOMI, no icterus Neck: Supple no JVP Cardiovascular: Regular in rhythm, no murmurs Pulmonary: Clear to auscultation bilaterally, no wheezing Abdomen: Nondistended, bowel sounds present MSK: No synovitis, no joint effusion Neuro: Normal gait, no weakness Psych: Normal mood, full affect   Assessment & Plan:   Chronic Cough: Likely multifactorial. Improved with Dulera in past although more recently Breo did not help. Begs question of cough variant asthma.  Did not improve with high-dose Dulera or low-dose gabapentin.  Has improved historically with prolonged steroid therapy.  Worse after RSV infection.  Trial nebulized budesonide at night given nocturnal cough major issue, with improvement.  New prescription today for twice a day to see if helps during the day as well.  Consider addition of nebulized LABA in the future if improved still residual cough needing addressed.  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  ILD: Mild on imaging.  Relatively stable.  Given mild fibrosis discussed risk and benefits of antifibrotic's.  Unfortunately cost prohibitive.    Return in about 6 months (around 08/14/2024) for f/u Dr. Judeth Horn.   Karren Burly,  MD 02/15/2024

## 2024-02-15 NOTE — Patient Instructions (Signed)
 Try using the budesonide nebulizer solution in the morning and in the evening, twice a day  Symptoms helped at night with sleep helps during the day  If it helps some but need additional medication let me know we can try additional nebulized medicines  No other changes to medication  Return to clinic in 6 months or sooner as needed with Dr. Judeth Horn

## 2024-02-23 ENCOUNTER — Telehealth: Payer: Self-pay | Admitting: Neurology

## 2024-02-23 NOTE — Telephone Encounter (Signed)
 I called patient and he is having weakness and dizziness since increasing Baclofen now at   increase to half tablet twice daily  Balance is well off as well worst than before he states. Generalized weakness no flu like symptoms. Please advise Dr.Patel and route to [place]. Thank you

## 2024-02-23 NOTE — Telephone Encounter (Signed)
 Patient wants to speak to someone about the Baclofen   He is having side effects such as weakness and dizziness

## 2024-02-24 NOTE — Telephone Encounter (Signed)
 Called patient and informed him of Dr. Eliane Decree recommendations. Patient verbalized understanding and will give [place] a call in a few days to let us know how he is doing off the medication.

## 2024-02-24 NOTE — Telephone Encounter (Signed)
 Please advise patient to stop baclofen and see how he does off medication. Thank you.

## 2024-02-28 ENCOUNTER — Ambulatory Visit: Payer: Medicare Other

## 2024-02-28 DIAGNOSIS — Z0000 Encounter for general adult medical examination without abnormal findings: Secondary | ICD-10-CM | POA: Diagnosis not present

## 2024-02-28 NOTE — Progress Notes (Signed)
 Subjective:   Gregory [name] is a 83 y.o. who presents for a Medicare Wellness preventive visit.  Visit Complete: Virtual I connected with  Gregory [name] on 02/28/24 by a audio enabled telemedicine application and verified that I am speaking with the correct person using two identifiers.  Patient Location: Home  Provider Location: Office/Clinic  I discussed the limitations of evaluation and management by telemedicine. The patient expressed understanding and agreed to proceed.  Vital Signs: Because this visit was a virtual/telehealth visit, some criteria may be missing or patient reported. Any vitals not documented were not able to be obtained and vitals that have been documented are patient reported.  VideoError- Librarian, academic were attempted between this provider and patient, however failed, due to patient having technical difficulties OR patient did not have access to video capability.  We continued and completed visit with audio only.   AWV Questionnaire: Yes: Patient Medicare AWV questionnaire was completed by the patient on 02/24/2024; I have confirmed that all information answered by patient is correct and no changes since this date.  Cardiac Risk Factors include: advanced age (>51men, >39 women);dyslipidemia;hypertension;male gender     Objective:    Today's Vitals   02/28/24 1006  PainSc: 3    There is no height or weight on file to calculate BMI.     02/28/2024   10:12 AM 01/31/2024    8:23 AM 01/19/2024    1:47 AM 12/21/2023    6:25 AM 11/03/2023    3:17 PM 09/24/2023   11:07 AM 07/18/2023    9:16 AM  Advanced Directives  Does Patient Have a Medical Advance Directive? Yes Yes Yes Yes No Yes Yes  Type of Estate agent of [place];Living will Healthcare Power of [place];Living will Healthcare Power of [place];Living will    Healthcare Power of [place];Out of facility DNR (pink MOST or yellow form);Living will   Does patient want to make changes to medical advance directive?      Yes (ED - Information included in AVS)   Copy of Healthcare Power of Attorney in Chart? No - copy requested          Current Medications (verified) Outpatient Encounter Medications as of 02/28/2024  Medication Sig   acetaminophen (TYLENOL) 325 MG tablet Take 2 tablets (650 mg total) by mouth every 6 (six) hours as needed for mild pain (or Fever >/= 101).   Ascorbic Acid (VITAMIN C) 1000 MG tablet Take 500 mg by mouth daily.   budesonide (PULMICORT) 0.5 MG/2ML nebulizer solution Take 2 mLs (0.5 mg total) by nebulization at bedtime.   Cholecalciferol 125 MCG (5000 UT) capsule Take 5,000 Units by mouth daily.   docusate sodium (COLACE) 100 MG capsule Take 200 mg by mouth at bedtime.   gabapentin (NEURONTIN) 100 MG capsule TAKE 1 CAPSULE (100 MG TOTAL) BY MOUTH 2 (TWO) TIMES DAILY.   Iron, Ferrous Sulfate, 325 (65 Fe) MG TABS Take 325 mg by mouth in the morning, at noon, and at bedtime.   levocetirizine (XYZAL) 5 MG tablet Take 1 tablet (5 mg total) by mouth every evening.   levothyroxine (SYNTHROID) 150 MCG tablet Take 1 tablet (150 mcg total) by mouth daily before breakfast.   magic mouthwash (nystatin, hydrocortisone, diphenhydrAMINE) suspension Swish and swallow 5 mLs 3 (three) times daily as needed (mouth and throat irritation).   Magnesium 250 MG TABS Take 250 mg by mouth at bedtime.    meclizine (ANTIVERT) 25 MG tablet TAKE 1  TABLET (25 MG TOTAL) BY MOUTH 2 (TWO) TIMES DAILY AS NEEDED FOR DIZZINESS.   methocarbamol (ROBAXIN) 500 MG tablet Take 1 tablet (500 mg total) by mouth every 8 (eight) hours as needed for muscle spasms.   montelukast (SINGULAIR) 10 MG tablet TAKE 1 TABLET BY MOUTH AT  BEDTIME   Multiple Vitamin (MULTIVITAMIN WITH MINERALS) TABS tablet Take 1 tablet by mouth every morning.   mupirocin ointment (BACTROBAN) 2 % Apply 1 Application topically 2 (two) times daily. To affected area. Patient will call when  ready to pick up.   pantoprazole (PROTONIX) 40 MG tablet TAKE 1 TABLET BY MOUTH TWICE  DAILY   silodosin (RAPAFLO) 8 MG CAPS capsule TAKE 1 CAPSULE BY MOUTH AT BEDTIME   sodium chloride 1 g tablet Take 1 tablet (1 g total) by mouth 2 (two) times daily with a meal.   baclofen (LIORESAL) 10 MG tablet Take half tablet at bedtime x 1 week, then increase to half tablet twice daily. (Patient not taking: Reported on 02/28/2024)   ezetimibe (ZETIA) 10 MG tablet Take 1 tablet (10 mg total) by mouth daily.   HYDROcodone bit-homatropine (HYCODAN) 5-1.5 MG/5ML syrup Take 5 mLs by mouth every 8 (eight) hours as needed for cough. (Patient not taking: Reported on 02/28/2024)   No facility-administered encounter medications on file as of 02/28/2024.    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  WITHOUT HEMOPTYSIS   History of nonmelanoma skin cancer EXCISION SQUAMOUS CELL FROM HAND   Hospital discharge follow-up 01/18/2023   Hypertrophy of prostate with urinary obstruction and other lower urinary tract symptoms (LUTS)    ILD (interstitial lung disease) (HCC)    Iron deficiency anemia due to chronic blood loss 10/08/2014   Irritable bowel syndrome    Laceration of arm, right, multiple sites, initial encounter 10/06/2023   Mild persistent  asthmatic bronchitis without complication 01/16/2014   Nocturnal leg cramps 06/11/2013   On home O2 01/18/2023   Pain in both lower extremities 10/28/2022   Plantar fascial fibromatosis    Pressure ulcer caused by device 01/07/2023   Pure hypercholesterolemia    RSV (respiratory syncytial virus pneumonia) 01/07/2023   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  Added automatically from request for surgery [id number]   Vaccine counseling 01/24/2023   Past Surgical History:  Procedure Laterality Date   APPLICATION OF CRANIAL NAVIGATION N/A 05/16/2015   Procedure: APPLICATION OF CRANIAL NAVIGATION;  Surgeon: Lisbeth Renshaw, MD;  [facility];  Service: Neurosurgery;  Laterality: N/A;   BRAIN BIOPSY Left 05/16/2015   Procedure: Stereotactic Left Brain Biopsy with Brain Lab;  Surgeon: Lisbeth Renshaw, MD;  Location: [facility] NEURO ORS;  Service: Neurosurgery;  Laterality: Left;  Stereotactic Left Brain biopsy with brainlab   BRAIN SURGERY     CARDIOVASCULAR STRESS TEST  01/14/2003   NO ISCHEMIA / EF 61%/ NORMAL LE WALL MOTION   EXCISION LEFT WRIST GANGLIAN/ MYXOID CYST  05/30/2009   IR RADIOLOGIST EVAL & MGMT  02/01/2019   NASAL SINUS SURGERY     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 Sharyn Lull   TRANSTHORACIC ECHOCARDIOGRAM  10-17-2008   DR CRENSHAW   NORMAL LVF/ EF 60%/  MILDLY DILATED RIGHT ATRIUM/ VENTRICULE    VARICOCELECTOMY  1991   Dr. Gaynelle Arabian   Family History  Problem Relation Age of Onset   Diabetes Mother    Hypertension Mother    Pancreatic cancer Mother    Stroke Mother    Kidney failure Father    Hypertension Sister    Healthy Son    Social History   Socioeconomic History   Marital status: Married    Spouse name: Ann   Number of children: 2   Years of education: Not on file   Highest education level: Master's degree (e.g., MA, MS, MEng, MEd, MSW, MBA)  Occupational History    Comment: Consultant   Occupation: retired  Tobacco Use   Smoking status: Never   Smokeless tobacco: Never  Vaping Use   Vaping status: Never Used  Substance and Sexual Activity   Alcohol use: No    Alcohol/week: 0.0 standard drinks of alcohol   Drug use: No   Sexual activity: Not on file  Other Topics Concern   Not on file  Social History Narrative   Lives with wife in a one story home.  Has 2 sons.  Retired Equities trader.  Education: Masters degree.      05/09/19- Pt states that his balance is worse since last visit. He has not fallen but has come close on a few occassions. The weakness on his right side is the same, he states.      Right Handed   Drinks caffeine    Social Drivers of Health   Financial Resource Strain: Low Risk  (02/28/2024)   Overall Financial Resource Strain (CARDIA)    Difficulty of Paying Living Expenses: Not hard at all  Food Insecurity: No Food Insecurity (02/28/2024)   Hunger Vital Sign    Worried About Running Out of Food in the Last Year: Never true    Ran Out of Food in the Last Year: Never true  Transportation Needs: No Transportation Needs (02/28/2024)   PRAPARE - Administrator, Civil Service (Medical): No    Lack of Transportation (Non-Medical): No  Physical Activity: Inactive (02/28/2024)   Exercise Vital Sign    Days of Exercise per Week: 0 days    Minutes of Exercise per Session: 0 min  Stress: No Stress Concern Present  (02/28/2024)   [employer] of Occupational Health - Occupational Stress Questionnaire    Feeling of Stress : Not at all  Social Connections: Moderately Integrated (02/28/2024)   Social Connection and Isolation Panel [  NHANES]    Frequency of Communication with Friends and Family: More than three times a week    Frequency of Social Gatherings with Friends and Family: Once a week    Attends Religious Services: More than 4 times per year    Active Member of [employer] or Organizations: No    Attends Engineer, structural: Never    Marital Status: Married    Tobacco Counseling Counseling given: Not Answered    Clinical Intake:  Pre-visit preparation completed: Yes  Pain : 0-10 Pain Score: 3  Pain Type: Chronic pain Pain Location: Hip Pain Orientation: Right Pain Descriptors / Indicators: Aching Pain Onset: More than a month ago Pain Frequency: Constant     Nutritional Risks: None Diabetes: No  How often do you need to have someone help you when you read instructions, pamphlets, or other written materials from your doctor or pharmacy?: 1 - Never  Interpreter Needed?: No  Information entered by :: NAllen LPN   Activities of Daily Living     02/24/2024   10:16 AM  In your present state of health, do you have any difficulty performing the following activities:  Hearing? 0  Vision? 0  Difficulty concentrating or making decisions? 0  Walking or climbing stairs? 0  Dressing or bathing? 0  Doing errands, shopping? 0  Preparing Food and eating ? N  Using the Toilet? N  In the past six months, have you accidently leaked urine? N  Do you have problems with loss of bowel control? N  Managing your Medications? N  Managing your Finances? N  Housekeeping or managing your Housekeeping? N    Patient Care Team: Early, Sung Amabile, NP as PCP - General (Nurse Practitioner) Lewayne Bunting, MD as PCP - Cardiology (Cardiology) Glendale Chard, DO as Consulting Physician  (Neurology) Kathyrn Sheriff, [facility] (Inactive) as Pharmacist (Pharmacist)  Indicate any recent Medical Services you may have received from other than [facility] providers in the past year (date may be approximate).     Assessment:   This is a routine wellness examination for D[place].  Hearing/Vision screen Hearing Screening - Comments:: Denies hearing issues Vision Screening - Comments:: Regular eye exams, [facility]   Goals Addressed             This Visit's Progress    Patient Stated       02/28/2024, start exercising       Depression Screen     02/28/2024   10:14 AM 01/26/2024   11:12 AM 02/22/2023   10:21 AM 09/06/2022    8:54 AM 04/26/2022    1:06 PM 02/16/2022    8:55 PM 06/09/2020    2:04 PM  PHQ 2/9 Scores  PHQ - 2 Score 0 0 0 0 0 0 0  PHQ- 9 Score 0  0 1     Exception Documentation     Medical reason      Fall Risk     02/24/2024   10:16 AM 01/31/2024    8:21 AM 01/26/2024   11:12 AM 07/18/2023    9:16 AM 02/22/2023   10:20 AM  Fall Risk   Falls in the past year? 1 1 1 1 1   Comment stumbled down the stairs    tripped over threshold  Number falls in past yr: 0 0 0 1 1  Injury with Fall? 0 1 1 0 0  Risk for fall due to : Medication side effect  Impaired balance/gait  Medication  side effect;History of fall(s);Impaired balance/gait;Impaired mobility  Follow up Falls prevention discussed;Falls evaluation completed Falls evaluation completed Falls evaluation completed Falls evaluation completed Falls prevention discussed;Education provided;Falls evaluation completed    MEDICARE RISK AT HOME:  Medicare Risk at Home Any stairs in or around the home?: (Patient-Rptd) Yes If so, are there any without handrails?: (Patient-Rptd) No Home free of loose throw rugs in walkways, pet beds, electrical cords, etc?: (Patient-Rptd) Yes Adequate lighting in your home to reduce risk of falls?: (Patient-Rptd) Yes Life alert?: (Patient-Rptd) No Use of a cane, walker or w/c?: (Patient-Rptd)  No Grab bars in the bathroom?: (Patient-Rptd) Yes Shower chair or bench in shower?: (Patient-Rptd) No Elevated toilet seat or a handicapped toilet?: (Patient-Rptd) Yes  TIMED UP AND GO:  Was the test performed?  No  Cognitive Function: 6CIT completed        02/28/2024   10:14 AM 02/22/2023   10:23 AM  6CIT Screen  What Year? 0 points 0 points  What month? 0 points 0 points  What time? 0 points 0 points  Count back from 20 0 points 0 points  Months in reverse 0 points 0 points  Repeat phrase 0 points 2 points  Total Score 0 points 2 points    Immunizations Immunization History  Administered Date(s) Administered   Fluad Quad(high Dose 65+) 08/24/2019, 10/02/2020, 09/11/2021   Fluad Trivalent(High Dose 65+) 09/13/2023   Influenza Split 09/21/2011, 09/27/2012   Influenza Whole 09/11/2008, 09/11/2009, 09/22/2010   Influenza, High Dose Seasonal PF 09/09/2016, 09/16/2017, 10/03/2018   Influenza, Quadrivalent, Recombinant, Inj, Pf 08/20/2022   Influenza,inj,Quad PF,6+ Mos 10/04/2013, 10/08/2014, 10/02/2015   PFIZER Comirnaty(Gray Top)Covid-19 Tri-Sucrose Vaccine 06/08/2021   PFIZER(Purple Top)SARS-COV-2 Vaccination 01/03/2020, 01/23/2020, 10/18/2020   PNEUMOCOCCAL CONJUGATE-20 04/26/2022   Pfizer Covid-19 Vaccine Bivalent Booster 18yrs & up 10/21/2021   Pfizer(Comirnaty)Fall Seasonal Vaccine 12 years and older 10/06/2022, 08/30/2023   Pneumococcal Conjugate-13 12/07/2016   Pneumococcal Polysaccharide-23 03/12/2010, 06/27/2018   Respiratory Syncytial Virus Vaccine,Recomb Aduvanted(Arexvy) 04/19/2023   Tdap 10/04/2013, 10/06/2023   Varicella 12/20/2009   Zoster Recombinant(Shingrix) 06/24/2022, 10/25/2022   Zoster, Live 09/06/2022    Screening Tests Health Maintenance  Topic Date Due   FOOT EXAM  Never done   OPHTHALMOLOGY EXAM  Never done   COVID-19 Vaccine (8 - 2024-25 season) 10/25/2023   HEMOGLOBIN A1C  07/26/2024   Diabetic kidney evaluation - eGFR measurement   01/26/2025   Diabetic kidney evaluation - Urine ACR  01/26/2025   Medicare Annual Wellness (AWV)  02/27/2025   Colonoscopy  06/29/2026   DTaP/Tdap/Td (3 - Td or Tdap) 10/05/2033   Pneumonia Vaccine 58+ Years old  Completed   INFLUENZA VACCINE  Completed   Zoster Vaccines- Shingrix  Completed   HPV VACCINES  Aged Out    Health Maintenance  Health Maintenance Due  Topic Date Due   FOOT EXAM  Never done   OPHTHALMOLOGY EXAM  Never done   COVID-19 Vaccine (8 - 2024-25 season) 10/25/2023   Health Maintenance Items Addressed: Patient is not diabetic. Everything is up to date  Additional Screening:  Vision Screening: Recommended annual ophthalmology exams for early detection of glaucoma and other disorders of the eye.  Dental Screening: Recommended annual dental exams for proper oral hygiene  Community Resource Referral / Chronic Care Management: CRR required this visit?  No   CCM required this visit?  No     Plan:     I have personally reviewed and noted the following in the patient's chart:  Medical and social history Use of alcohol, tobacco or illicit drugs  Current medications and supplements including opioid prescriptions. Patient is not currently taking opioid prescriptions. Functional ability and status Nutritional status Physical activity Advanced directives List of other physicians Hospitalizations, surgeries, and ER visits in previous 12 months Vitals Screenings to include cognitive, depression, and falls Referrals and appointments  In addition, I have reviewed and discussed with patient certain preventive protocols, quality metrics, and best practice recommendations. A written personalized care plan for preventive services as well as general preventive health recommendations were provided to patient.     Barb Merino, LPN   [id number]   After Visit Summary: (MyChart) Due to this being a telephonic visit, the after visit summary with patients  personalized plan was offered to patient via MyChart   Notes: Nothing significant to report at this time.

## 2024-02-28 NOTE — Patient Instructions (Signed)
 Gregory [name] , Thank you for taking time to come for your Medicare Wellness Visit. I appreciate your ongoing commitment to your health goals. Please review the following plan we discussed and let me know if I can assist you in the future.   Referrals/Orders/Follow-Ups/Clinician Recommendations: none  This is a list of the screening recommended for you and due dates:  Health Maintenance  Topic Date Due   Complete foot exam   Never done   Eye exam for diabetics  Never done   COVID-19 Vaccine (8 - 2024-25 season) 10/25/2023   Hemoglobin A1C  07/26/2024   Yearly kidney function blood test for diabetes  01/26/2025   Yearly kidney health urinalysis for diabetes  01/26/2025   Medicare Annual Wellness Visit  02/27/2025   Colon Cancer Screening  06/29/2026   DTaP/Tdap/Td vaccine (3 - Td or Tdap) 10/05/2033   Pneumonia Vaccine  Completed   Flu Shot  Completed   Zoster (Shingles) Vaccine  Completed   HPV Vaccine  Aged Out    Advanced directives: (Copy Requested) Please bring a copy of your health care power of attorney and living will to the office to be added to your chart at your convenience. You can mail to [facility] [address] [place], [place] [address] or email to [email or web address]  Next Medicare Annual Wellness Visit scheduled for next year: Yes  insert Preventive Care attachment Insert FALL PREVENTION attachment if needed

## 2024-02-29 ENCOUNTER — Other Ambulatory Visit: Payer: Self-pay | Admitting: Nurse Practitioner

## 2024-02-29 DIAGNOSIS — E039 Hypothyroidism, unspecified: Secondary | ICD-10-CM

## 2024-03-04 ENCOUNTER — Other Ambulatory Visit (HOSPITAL_BASED_OUTPATIENT_CLINIC_OR_DEPARTMENT_OTHER): Payer: Self-pay | Admitting: Nurse Practitioner

## 2024-03-04 DIAGNOSIS — I10 Essential (primary) hypertension: Secondary | ICD-10-CM

## 2024-03-14 ENCOUNTER — Ambulatory Visit: Payer: Medicare Other | Attending: Physician Assistant | Admitting: Physician Assistant

## 2024-03-14 VITALS — BP 138/60 | HR 62 | Ht 69.0 in | Wt 184.0 lb

## 2024-03-14 DIAGNOSIS — R55 Syncope and collapse: Secondary | ICD-10-CM

## 2024-03-14 NOTE — Progress Notes (Unsigned)
 Cardiology Office Note:  .   Date:  03/15/2024  ID:  [name], DOB Mar 17, 1941, MRN [id number] PCP: Tollie Eth, NP  [facility] Providers Cardiologist:  Olga Millers, MD     History of Present Illness: RANGEL ECHEVERRI is a 83 y.o. 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  Patient was admitted in May 2016 with headache and right lower extremity weakness.  CT of the head demonstrated mass that measures 3 x 2 cm with midline shift and edema.  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  He underwent stereotactic biopsy by neurosurgery.  He was diagnosed with brain abscess.  Culture grew Peptostreptococcus species.  He was followed by infectious disease and treated with vancomycin, Rocephin and Flagyl.  He was placed on Keppra for seizure prophylaxis.  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  Following discharge, he had a episode of elevated heart rate.  EMS was called and he was noted to be in atrial flutter.  This converted to sinus rhythm spontaneously.  He was placed on metoprolol succinate.  Event monitor showed slow atrial flutter versus ectopic atrial tachycardia.  Heart monitor in July 2021 showed sinus bradycardia, normal sinus rhythm, sinus tachycardia.  He did have dizziness while the heart monitor was in place.  Carotid Doppler in August 2023 showed no significant stenosis.  Echocardiogram in January 2024 showed normal EF, mild LVH.  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  Patient was last seen by Dr. Jens Som in December 2024.  He had orthostatic hypotension, it was recommended to increase fluid and sodium intake.   I last saw the patient in February 2025, he had experienced  multiple recurrent episode epistaxis along with significant blood loss, lightheadedness and syncope.  During 1 the episode, EMS arrived, his blood pressure was 86/40.  He was seen by family medicine and neurology service.  Valsartan stopped.  He was seen and encouraged to follow-up with both neurology and cardiology service.  Neurology service felt syncopal episode was likely vasovagal in nature.  It was offered for him to undergo EEG however patient like to hold off.  He patient to me during the last office visit that he had 1.5 hours of prolonged nosebleed that day prior to passing out spell.  Echocardiogram performed on 01/31/2024 showed normal EF 60 to 65%, no regional wall motion abnormality, grade 1 DD, mild MR, mild to moderate TR.  I recommended 2-week heart monitor, and then if negative, I did not recommend any further workup.  Heart monitor pursued in March showed occasional PVC which I can only for 2.7% of total burden, mostly sinus rhythm.  No dangerous type irregular rhythm that would explain the passing out spell.  Patient presents today for follow-up.  He still occasionally have dizziness when he gets up too quickly.  Otherwise his heart monitor was reassuring.  I reviewed the heart monitor report with him.  At this point I do not recommend any further workup.  He has blood pressure cuff indicate that his blood pressure is ranging in the 130s to 140s at home, I am okay with his blood pressure running slightly high.  His heart monitor also indicated occasional irregular heartbeat, I suspect this is due to  his PVCs and bigeminy's.  There is low suspicion for any significant irregular rhythm.  Patient can follow-up with Dr. Jens Som in 4-6 months.   ROS:   He has not had any further syncope since the last visit.  He does have occasional dizzy spell.  Studies Reviewed: .        Cardiac Studies & Procedures    ______________________________________________________________________________________________     ECHOCARDIOGRAM  ECHOCARDIOGRAM COMPLETE 01/31/2024  Narrative ECHOCARDIOGRAM REPORT    Patient Name:   ROMAINE NEVILLE Date of Exam: 01/31/2024 Medical Rec #:  [id number]        Height:       70.0 in Accession #:    [id number]       Weight:       187.0 lb Date of Birth:  [birth year]       BSA:          2.029 m Patient Age:    82 years         BP:           136/68 mmHg Patient Gender: M                HR:           73 bpm. Exam Location:  [address]  Procedure: 2D Echo, Cardiac Doppler, Color Doppler, 3D Echo and Strain Analysis  Indications:    R55 Syncope  History:        Patient has prior history of Echocardiogram examinations, most recent 12/29/2022. Signs/Symptoms:Syncope; Risk Factors:Hypertension, Dyslipidemia and Diabetes.  Sonographer:    Samule Ohm RDCS Referring Phys: [id number] BRIAN S CRENSHAW  IMPRESSIONS   1. Left ventricular ejection fraction, by estimation, is 60 to 65%. The left ventricle has normal function. The left ventricle has no regional wall motion abnormalities. There is mild concentric left ventricular hypertrophy. Left ventricular diastolic parameters are consistent with Grade I diastolic dysfunction (impaired relaxation). 2. Right ventricular systolic function is normal. The right ventricular size is mildly enlarged. 3. Left atrial size was mildly dilated. 4. The mitral valve is grossly normal. Mild mitral valve regurgitation. 5. Tricuspid valve regurgitation is mild to moderate. 6. The aortic valve is tricuspid. Aortic valve regurgitation is not visualized. 7. The inferior vena cava is normal in size with greater than 50% respiratory variability, suggesting right atrial pressure of 3 mmHg.  FINDINGS Left Ventricle: Left ventricular ejection fraction, by estimation, is 60 to 65%. The left ventricle has normal function. The left ventricle has no  regional wall motion abnormalities. The left ventricular internal cavity size was normal in size. There is mild concentric left ventricular hypertrophy. Left ventricular diastolic parameters are consistent with Grade I diastolic dysfunction (impaired relaxation).  Right Ventricle: The right ventricular size is mildly enlarged. Right ventricular systolic function is normal.  Left Atrium: Left atrial size was mildly dilated.  Right Atrium: Right atrial size was normal in size.  Pericardium: There is no evidence of pericardial effusion.  Mitral Valve: The mitral valve is grossly normal. Mild mitral valve regurgitation.  Tricuspid Valve: Tricuspid valve regurgitation is mild to moderate.  Aortic Valve: The aortic valve is tricuspid. Aortic valve regurgitation is not visualized.  Pulmonic Valve: Pulmonic valve regurgitation is not visualized.  Aorta: The aortic root and ascending aorta are structurally normal, with no evidence of dilitation.  Venous: The inferior vena cava is normal in size with greater than 50% respiratory variability, suggesting right atrial pressure of 3 mmHg.  IAS/Shunts: No atrial level shunt  detected by color flow Doppler.   LEFT VENTRICLE PLAX 2D LVIDd:         4.80 cm   Diastology LVIDs:         3.10 cm   LV e' medial:    8.16 cm/s LV PW:         1.30 cm   LV E/e' medial:  9.8 LV IVS:        1.20 cm   LV e' lateral:   9.90 cm/s LVOT diam:     2.00 cm   LV E/e' lateral: 8.1 LV SV:         91 LV SV Index:   45        2D Longitudinal Strain LVOT Area:     3.14 cm  2D Strain GLS (A2C):   18.1 % 2D Strain GLS (A3C):   19.2 % 2D Strain GLS (A4C):   16.0 % 2D Strain GLS Avg:     17.8 %  3D Volume EF: 3D EF:        58 % LV EDV:       172 ml LV ESV:       72 ml LV SV:        100 ml  RIGHT VENTRICLE             IVC RV S prime:     12.00 cm/s  IVC diam: 1.20 cm TAPSE (M-mode): 2.2 cm RVSP:           27.0 mmHg  LEFT ATRIUM           Index        RIGHT  ATRIUM           Index LA diam:      4.40 cm 2.17 cm/m   RA Pressure: 3.00 mmHg LA Vol (A2C): 68.2 ml 33.62 ml/m  RA Area:     13.40 cm LA Vol (A4C): 64.9 ml 31.99 ml/m  RA Volume:   38.00 ml  18.73 ml/m AORTIC VALVE LVOT Vmax:   112.00 cm/s LVOT Vmean:  72.900 cm/s LVOT VTI:    0.289 m  AORTA Ao Root diam: 3.60 cm Ao Asc diam:  3.10 cm  MITRAL VALVE               TRICUSPID VALVE MV Area (PHT): 2.66 cm    TR Peak grad:   24.0 mmHg MV Decel Time: 285 msec    TR Vmax:        245.00 cm/s MV E velocity: 79.70 cm/s  Estimated RAP:  3.00 mmHg MV A velocity: 87.50 cm/s  RVSP:           27.0 mmHg MV E/A ratio:  0.91 SHUNTS Systemic VTI:  0.29 m Systemic Diam: 2.00 cm  Carolan Clines Electronically signed by Carolan Clines Signature Date/Time: 01/31/2024/4:49:16 PM    Final    MONITORS  LONG TERM MONITOR-LIVE TELEMETRY (3-14 DAYS) 03/08/2024  Narrative Patch Wear Time:  13 days and 23 hours (2025-02-22T09:47:24-0500 to 2025-03-08T09:16:48-0500)  Patient had a min HR of 48 bpm, max HR of 160 bpm, and avg HR of 70 bpm. Predominant underlying rhythm was Sinus Rhythm. Slight P wave morphology changes were noted. 2 Ventricular Tachycardia runs occurred, the run with the fastest interval lasting 4 beats with a max rate of 160 bpm, the longest lasting 6 beats with an avg rate of 111 bpm. 14 Supraventricular Tachycardia runs occurred, the run with the fastest interval lasting 4 beats  with a max rate of 158 bpm, the longest lasting 8 beats with an avg rate of 113 bpm. Isolated SVEs were rare (<1.0%), SVE Couplets were rare (<1.0%), and SVE Triplets were rare (<1.0%). Isolated VEs were occasional (2.7%, 38141), VE Couplets were rare (<1.0%, 265), and VE Triplets were rare (<1.0%, 10). Ventricular Bigeminy and Trigeminy were present.  Sinus bradycardia, NSR, sinus tachycardia, occasional PAC, short runs of SVT, occasional PVC, 4 and 6 beats NSVT Olga Millers        ______________________________________________________________________________________________      Risk Assessment/Calculations:            Physical Exam:   VS:  BP 138/60 (BP Location: Right Arm, Patient Position: Sitting, Cuff Size: Normal)   Pulse 62   Ht 5\' 9"  (1.753 m)   Wt 184 lb (83.5 kg)   SpO2 94%   BMI 27.17 kg/m    Wt Readings from Last 3 Encounters:  03/14/24 184 lb (83.5 kg)  02/15/24 186 lb 12.8 oz (84.7 kg)  02/03/24 188 lb (85.3 kg)    GEN: Well nourished, well developed in no acute distress NECK: No JVD; No carotid bruits CARDIAC: RRR, no murmurs, rubs, gallops RESPIRATORY:  Clear to auscultation without rales, wheezing or rhonchi  ABDOMEN: Soft, non-tender, non-distended EXTREMITIES:  No edema; No deformity   ASSESSMENT AND PLAN: .    Syncope Recently wear a 2-week heart monitor.  This revealed occasional PVCs with a 2.7% burden, benign and not requiring treatment. -He continued to have dizzy spell with body position changes -Dizziness and lightheadedness likely due to orthostatic changes, possibly exacerbated by PVCs. - Advised him to change positions slowly. - Blood pressure readings in the 130s to 140s, borderline high but not concerning in an older patient. - Monitor blood pressure at home and report significant changes.         Dispo: Follow-up with Dr. Jens Som in 4 to 6 months  Signed, Azalee Course, [place]

## 2024-03-14 NOTE — Patient Instructions (Signed)
 Medication Instructions:  NO CHANGES *If you need a refill on your cardiac medications before your next appointment, please call your pharmacy*   Lab Work: NO LABS If you have labs (blood work) drawn today and your tests are completely normal, you will receive your results only by: MyChart Message (if you have MyChart) OR A paper copy in the mail If you have any lab test that is abnormal or we need to change your treatment, we will call you to review the results.   Testing/Procedures: NO TESTING   Follow-Up: At [facility], you and your health needs are our priority.  As part of our continuing mission to provide you with exceptional heart care, we have created designated Provider Care Teams.  These Care Teams include your primary Cardiologist (physician) and Advanced Practice Providers (APPs -  Physician Assistants and Nurse Practitioners) who all work together to provide you with the care you need, when you need it.  Your next appointment:   4-6 month(s)  Provider:   Olga Millers, MD   Other Instructions

## 2024-03-18 ENCOUNTER — Other Ambulatory Visit: Payer: Self-pay | Admitting: Pulmonary Disease

## 2024-03-27 ENCOUNTER — Inpatient Hospital Stay: Payer: Self-pay

## 2024-03-27 ENCOUNTER — Inpatient Hospital Stay: Payer: Medicare Other | Attending: Nurse Practitioner | Admitting: Oncology

## 2024-03-27 ENCOUNTER — Other Ambulatory Visit: Payer: Medicare Other

## 2024-03-27 VITALS — BP 139/61 | HR 60 | Temp 98.1°F | Resp 18 | Ht 69.0 in | Wt 178.7 lb

## 2024-03-27 DIAGNOSIS — D500 Iron deficiency anemia secondary to blood loss (chronic): Secondary | ICD-10-CM

## 2024-03-27 DIAGNOSIS — D509 Iron deficiency anemia, unspecified: Secondary | ICD-10-CM | POA: Insufficient documentation

## 2024-03-27 LAB — CBC (CANCER CENTER ONLY)
HCT: 44.8 % (ref 39.0–52.0)
Hemoglobin: 15.6 g/dL (ref 13.0–17.0)
MCH: 31.7 pg (ref 26.0–34.0)
MCHC: 34.8 g/dL (ref 30.0–36.0)
MCV: 91.1 fL (ref 80.0–100.0)
Platelet Count: 266 10*3/uL (ref 150–400)
RBC: 4.92 MIL/uL (ref 4.22–5.81)
RDW: 13.6 % (ref 11.5–15.5)
WBC Count: 8.1 10*3/uL (ref 4.0–10.5)
nRBC: 0 % (ref 0.0–0.2)

## 2024-03-27 LAB — FERRITIN: Ferritin: 25 ng/mL (ref 24–336)

## 2024-03-27 NOTE — Progress Notes (Signed)
  [facility] OFFICE PROGRESS NOTE   Diagnosis: Iron deficiency anemia  INTERVAL HISTORY:   Gregory [name] returns as scheduled.  He has intermittent nosebleeds.  He is taking iron.  He is being evaluated by cardiology for intermittent "dizzy "spells and syncope.  He relates weight loss to walking for exercise.  Objective:  Vital signs in last 24 hours:  Blood pressure 139/61, pulse 60, temperature 98.1 F (36.7 C), resp. rate 18, height 5\' 9"  (1.753 m), weight 178 lb 11.2 oz (81.1 kg), SpO2 92%.    HEENT: No thrush, multiple telangiectasias Resp: Lungs clear bilaterally Cardio: Regular rate and rhythm GI: No hepatosplenomegaly Vascular: No leg edema  Skin: Diffuse telangiectasias  Portacath/PICC-without erythema  Lab Results:  Lab Results  Component Value Date   WBC 8.1 03/27/2024   HGB 15.6 03/27/2024   HCT 44.8 03/27/2024   MCV 91.1 03/27/2024   PLT 266 03/27/2024   NEUTROABS 4.7 01/31/2024    CMP  Lab Results  Component Value Date   NA 134 01/27/2024   K 4.8 01/27/2024   CL 96 01/27/2024   CO2 24 01/27/2024   GLUCOSE 91 01/27/2024   BUN 9 01/27/2024   CREATININE 1.02 01/27/2024   CALCIUM 9.4 01/27/2024   PROT 6.5 01/27/2024   ALBUMIN 4.3 01/27/2024   AST 20 01/27/2024   ALT 17 01/27/2024   ALKPHOS 87 01/27/2024   BILITOT 0.4 01/27/2024   GFRNONAA >60 01/19/2024   GFRAA >60 06/01/2020     Medications: I have reviewed the patient's current medications.   Assessment/Plan: Iron deficiency anemia  08/18/2021 hemoglobin 6.8, MCV 58 08/20/2021 hemoglobin 6.6 MCV 59, ferritin 3 08/22/2021 hemoglobin 6.5, stool negative for occult blood, transfused 2 units of blood, Venofer 200 mg IV 08/23/2021 hemoglobin 8.3 HHT, Osler-Weber-Rendu Pulmonary AVM right lower lobe, followed by Dr. Judeth Horn pulmonary Gastric and duodenal AVMs 09/21/2016 upper endoscopy-normal esophagus, gastric AVMs, duodenal AVMs History of brain abscess 2016  History of dyspnea  on exertion GERD Admission January 2024 with RSV pneumonia       Disposition: Gregory [name] appears stable.  The hemoglobin is normal.  The ferritin was at the low end of the normal range in February.  We will follow-up on the ferritin level from today.  He will continue oral iron.  He will call for symptoms of anemia.  He will return for a lab visit in 4 months in 8 months.  He will be scheduled for a 1 year office visit. He last received IV iron and a Red cell transfusion approximately 2 and half years ago. Gregory Papas, MD  03/27/2024  11:49 AM

## 2024-03-28 ENCOUNTER — Telehealth: Payer: Self-pay | Admitting: Oncology

## 2024-03-28 NOTE — Telephone Encounter (Signed)
 Patient has been scheduled for follow-up visit per 03/27/24 LOS.  Pt aware of scheduled appt details.

## 2024-04-17 ENCOUNTER — Other Ambulatory Visit: Payer: Self-pay

## 2024-04-17 ENCOUNTER — Ambulatory Visit: Payer: Self-pay

## 2024-04-17 DIAGNOSIS — Z2989 Encounter for other specified prophylactic measures: Secondary | ICD-10-CM

## 2024-04-17 MED ORDER — AMOXICILLIN 500 MG PO CAPS: ORAL_CAPSULE | ORAL | 6 refills | Status: DC

## 2024-04-17 NOTE — Telephone Encounter (Signed)
 Patient requesting a refill of his Amoxicillin  that he takes prophylactically before dental procedures. Patient has an upcoming dental procedure on Thursday and states he has one dosage to get him through that procedure, but he needs a refill for the next occurrence. He also would like this sent to new pharmacy, [facility]. Patient states he originally had 3 refills remaining but they have expired.

## 2024-04-30 ENCOUNTER — Other Ambulatory Visit: Payer: Self-pay | Admitting: Cardiology

## 2024-04-30 ENCOUNTER — Other Ambulatory Visit (HOSPITAL_BASED_OUTPATIENT_CLINIC_OR_DEPARTMENT_OTHER): Payer: Self-pay | Admitting: Nurse Practitioner

## 2024-04-30 DIAGNOSIS — K219 Gastro-esophageal reflux disease without esophagitis: Secondary | ICD-10-CM

## 2024-04-30 DIAGNOSIS — J4530 Mild persistent asthma, uncomplicated: Secondary | ICD-10-CM

## 2024-04-30 DIAGNOSIS — I10 Essential (primary) hypertension: Secondary | ICD-10-CM

## 2024-05-30 ENCOUNTER — Telehealth: Payer: Self-pay | Admitting: Cardiology

## 2024-05-30 ENCOUNTER — Other Ambulatory Visit: Payer: Self-pay | Admitting: Nurse Practitioner

## 2024-05-30 DIAGNOSIS — N401 Enlarged prostate with lower urinary tract symptoms: Secondary | ICD-10-CM

## 2024-05-30 NOTE — Telephone Encounter (Signed)
 Spoke with patient and he feels his Zetia  has been causing him to have leg cramps. He states when he called in he was informed that he was supposed to discontinue his Zetia  in April.  I do not see he was supposed to d/c zetia  can we confirm if he is supposed to continue Zetia .  He states if he is to continue he would like to discuss because of the leg cramps.

## 2024-05-30 NOTE — Telephone Encounter (Signed)
 Pt c/o medication issue:  1. Name of Medication: ezetimibe  (ZETIA ) 10 MG tablet (Expired)   2. How are you currently taking this medication (dosage and times per day)? As written   3. Are you having a reaction (difficulty breathing--STAT)? N/A  4. What is your medication issue? Pt states this medication is causing leg cramps and would like to know if he should stop taking it.

## 2024-05-31 ENCOUNTER — Other Ambulatory Visit: Payer: Self-pay

## 2024-05-31 ENCOUNTER — Emergency Department (HOSPITAL_BASED_OUTPATIENT_CLINIC_OR_DEPARTMENT_OTHER)

## 2024-05-31 ENCOUNTER — Encounter (HOSPITAL_BASED_OUTPATIENT_CLINIC_OR_DEPARTMENT_OTHER): Payer: Self-pay | Admitting: Emergency Medicine

## 2024-05-31 ENCOUNTER — Emergency Department (HOSPITAL_BASED_OUTPATIENT_CLINIC_OR_DEPARTMENT_OTHER)
Admission: EM | Admit: 2024-05-31 | Discharge: 2024-05-31 | Disposition: A | Discharge status: Home / Self Care | Attending: Emergency Medicine | Admitting: Emergency Medicine

## 2024-05-31 DIAGNOSIS — R1031 Right lower quadrant pain: Secondary | ICD-10-CM | POA: Diagnosis present

## 2024-05-31 DIAGNOSIS — Z79899 Other long term (current) drug therapy: Secondary | ICD-10-CM | POA: Diagnosis not present

## 2024-05-31 DIAGNOSIS — Z7951 Long term (current) use of inhaled steroids: Secondary | ICD-10-CM | POA: Diagnosis not present

## 2024-05-31 DIAGNOSIS — J4530 Mild persistent asthma, uncomplicated: Secondary | ICD-10-CM | POA: Diagnosis not present

## 2024-05-31 DIAGNOSIS — I10 Essential (primary) hypertension: Secondary | ICD-10-CM | POA: Diagnosis not present

## 2024-05-31 LAB — COMPREHENSIVE METABOLIC PANEL WITH GFR
ALT: 15 U/L (ref 0–44)
AST: 22 U/L (ref 15–41)
Albumin: 4.5 g/dL (ref 3.5–5.0)
Alkaline Phosphatase: 80 U/L (ref 38–126)
Anion gap: 14 (ref 5–15)
BUN: 11 mg/dL (ref 8–23)
CO2: 24 mmol/L (ref 22–32)
Calcium: 9.4 mg/dL (ref 8.9–10.3)
Chloride: 94 mmol/L — ABNORMAL LOW (ref 98–111)
Creatinine, Ser: 0.88 mg/dL (ref 0.61–1.24)
GFR, Estimated: >60 mL/min (ref >60)
Glucose, Bld: 87 mg/dL (ref 70–99)
Potassium: 4.3 mmol/L (ref 3.5–5.1)
Sodium: 132 mmol/L — ABNORMAL LOW (ref 135–145)
Total Bilirubin: 0.4 mg/dL (ref 0.0–1.2)
Total Protein: 7.1 g/dL (ref 6.5–8.1)

## 2024-05-31 LAB — URINALYSIS, ROUTINE W REFLEX MICROSCOPIC
Bacteria, UA: NONE SEEN
Bilirubin Urine: NEGATIVE
Glucose, UA: NEGATIVE mg/dL
Hgb urine dipstick: NEGATIVE
Ketones, ur: NEGATIVE mg/dL
Leukocytes,Ua: NEGATIVE
Nitrite: NEGATIVE
Protein, ur: NEGATIVE mg/dL
Specific Gravity, Urine: 1.01 (ref 1.005–1.030)
pH: 7 (ref 5.0–8.0)

## 2024-05-31 LAB — CBC
HCT: 41.4 % (ref 39.0–52.0)
Hemoglobin: 15 g/dL (ref 13.0–17.0)
MCH: 31.6 pg (ref 26.0–34.0)
MCHC: 36.2 g/dL — ABNORMAL HIGH (ref 30.0–36.0)
MCV: 87.3 fL (ref 80.0–100.0)
Platelets: 285 10*3/uL (ref 150–400)
RBC: 4.74 MIL/uL (ref 4.22–5.81)
RDW: 13 % (ref 11.5–15.5)
WBC: 7.6 10*3/uL (ref 4.0–10.5)
nRBC: 0 % (ref 0.0–0.2)

## 2024-05-31 LAB — LIPASE, BLOOD: Lipase: 11 U/L (ref 11–51)

## 2024-05-31 MED ORDER — IOHEXOL 300 MG/ML  SOLN
100.0000 mL | Freq: Once | INTRAMUSCULAR | Status: AC | PRN
Start: 2024-05-31 — End: 2024-05-31
  Administered 2024-05-31: 100 mL via INTRAVENOUS

## 2024-05-31 NOTE — ED Provider Notes (Signed)
 [facility]MENT AT [facility] Provider Note   CSN: [id number] Arrival date & time: 05/31/24  1718     Patient presents with: Abdominal Pain   Gregory [name] is a 83 y.o. male.   Patient is a 83 year old male who presents with abdominal pain.  He says it started about 12:00 today.  It has been getting worse throughout the day.  Send in his right lower abdomen.  Its otherwise nonradiating.  He denies any vomiting always had a little bit of nausea.  No urinary symptoms.  No change in his stools.  No prior abdominal surgeries other than a skin graft.  No fevers.       Prior to Admission medications   Medication Sig Start Date End Date Taking? Authorizing Provider  acetaminophen  (TYLENOL ) 325 MG tablet Take 2 tablets (650 mg total) by mouth every 6 (six) hours as needed for mild pain (or Fever >/= 101). 01/04/23   Elgergawy, Ardia Kraft, MD  amoxicillin  (AMOXIL ) 500 MG capsule SMARTSIG:4 Capsule(s) By Mouth PRN for dental visits 04/17/24   Early, [name], NP  Ascorbic Acid  (VITAMIN C ) 1000 MG tablet Take 500 mg by mouth daily.    [provider]  budesonide  (PULMICORT ) 0.5 MG/2ML nebulizer solution TAKE 2 MLS (0.5 MG TOTAL) BY NEBULIZATION AT BEDTIME. 03/19/24   Hunsucker, [name], MD  Cholecalciferol  125 MCG (5000 UT) capsule Take 5,000 Units by mouth daily.    [provider]  docusate sodium  (COLACE) 100 MG capsule Take 200 mg by mouth at bedtime.    [provider]  gabapentin  (NEURONTIN ) 100 MG capsule TAKE 1 CAPSULE (100 MG TOTAL) BY MOUTH 2 (TWO) TIMES DAILY. 07/27/23   Early, [name], NP  HYDROcodone  bit-homatropine (HYCODAN) 5-1.5 MG/5ML syrup Take 5 mLs by mouth every 8 (eight) hours as needed for cough. 12/22/23   Annella Kief, NP  Iron , Ferrous Sulfate , 325 (65 Fe) MG TABS Take 325 mg by mouth in the morning, at noon, and at bedtime. 08/23/21   Ghimire, [name], MD  levocetirizine (XYZAL ) 5 MG tablet Take 1 tablet (5 mg total) by mouth  every evening. 06/27/18   [name], MD  levothyroxine  (SYNTHROID ) 150 MCG tablet TAKE 1 TABLET BY MOUTH DAILY  BEFORE BREAKFAST 03/01/24   Early, [name], NP  magic mouthwash (nystatin , hydrocortisone , diphenhydrAMINE ) suspension Swish and swallow 5 mLs 3 (three) times daily as needed (mouth and throat irritation). Patient not taking: Reported on 03/27/2024 12/23/22   Early, [name], NP  Magnesium  250 MG TABS Take 250 mg by mouth at bedtime.     [provider]  meclizine  (ANTIVERT ) 25 MG tablet TAKE 1 TABLET (25 MG TOTAL) BY MOUTH 2 (TWO) TIMES DAILY AS NEEDED FOR DIZZINESS. 11/29/23   Early, [name], NP  methocarbamol  (ROBAXIN ) 500 MG tablet Take 1 tablet (500 mg total) by mouth every 8 (eight) hours as needed for muscle spasms. 10/26/21   [name], MD  montelukast  (SINGULAIR ) 10 MG tablet TAKE 1 TABLET BY MOUTH AT  BEDTIME 04/30/24   Early, [name], NP  Multiple Vitamin (MULTIVITAMIN WITH MINERALS) TABS tablet Take 1 tablet by mouth every morning.    [provider]  mupirocin  ointment (BACTROBAN ) 2 % Apply 1 Application topically 2 (two) times daily. To affected area. Patient will call when ready to pick up. Patient not taking: Reported on 03/27/2024 10/06/23   Annella Kief, NP  pantoprazole  (PROTONIX ) 40 MG tablet TAKE 1  TABLET BY MOUTH TWICE  DAILY 04/30/24   Early, [name], NP  silodosin  (RAPAFLO ) 8 MG CAPS capsule TAKE 1 CAPSULE BY MOUTH AT BEDTIME 05/30/24   Early, [name], NP  sodium chloride  1 g tablet Take 1 tablet (1 g total) by mouth 2 (two) times daily with a meal. Patient taking differently: Take 1 g by mouth daily at 6 (six) AM. 01/24/23   Early, [name], NP    Allergies: Nsaids, Other, Aspirin, Statins, Demeclocycline, Dust mite extract, and Tizanidine hcl    Review of Systems  Constitutional:  Negative for chills, diaphoresis, fatigue and fever.  HENT:  Negative for congestion, rhinorrhea and sneezing.   Eyes: Negative.   Respiratory:  Negative for cough, chest  tightness and shortness of breath.   Cardiovascular:  Negative for chest pain and leg swelling.  Gastrointestinal:  Positive for abdominal pain and nausea. Negative for blood in stool, diarrhea and vomiting.  Genitourinary:  Negative for difficulty urinating, flank pain, frequency and hematuria.  Musculoskeletal:  Negative for arthralgias and back pain.  Skin:  Negative for rash.  Neurological:  Negative for dizziness, speech difficulty, weakness, numbness and headaches.    Updated Vital Signs BP (!) 148/62 (BP Location: Right Arm)   Pulse 60   Temp 97.9 F (36.6 C) (Oral)   Resp 18   SpO2 95%   Physical Exam Constitutional:      Appearance: He is well-developed.  HENT:     Head: Normocephalic and atraumatic.   Eyes:     Pupils: Pupils are equal, round, and reactive to light.    Cardiovascular:     Rate and Rhythm: Normal rate and regular rhythm.     Heart sounds: Normal heart sounds.  Pulmonary:     Effort: Pulmonary effort is normal. No respiratory distress.     Breath sounds: Normal breath sounds. No wheezing or rales.  Chest:     Chest wall: No tenderness.  Abdominal:     General: Bowel sounds are normal.     Palpations: Abdomen is soft.     Tenderness: There is abdominal tenderness in the right lower quadrant. There is no guarding or rebound.   Musculoskeletal:        General: Normal range of motion.     Cervical back: Normal range of motion and neck supple.  Lymphadenopathy:     Cervical: No cervical adenopathy.   Skin:    General: Skin is warm and dry.     Findings: No rash.   Neurological:     Mental Status: He is alert and oriented to person, place, and time.     (all labs ordered are listed, but only abnormal results are displayed) Labs Reviewed  COMPREHENSIVE METABOLIC PANEL WITH GFR - Abnormal; Notable for the following components:      Result Value   Sodium 132 (*)    Chloride 94 (*)    All other components within normal limits  CBC -  Abnormal; Notable for the following components:   MCHC 36.2 (*)    All other components within normal limits  URINALYSIS, ROUTINE W REFLEX MICROSCOPIC - Abnormal; Notable for the following components:   Color, Urine COLORLESS (*)    All other components within normal limits  LIPASE, BLOOD    EKG: None  Radiology: CT ABDOMEN PELVIS W CONTRAST Result Date: 05/31/2024 CLINICAL DATA:  Right lower quadrant abdominal pain. EXAM: CT ABDOMEN AND PELVIS WITH CONTRAST TECHNIQUE: Multidetector CT imaging of the abdomen and  pelvis was performed using the standard protocol following bolus administration of intravenous contrast. RADIATION DOSE REDUCTION: This exam was performed according to the departmental dose-optimization program which includes automated exposure control, adjustment of the mA and/or kV according to patient size and/or use of iterative reconstruction technique. CONTRAST:  OMNIPAQUE  IOHEXOL  300 MG/ML  SOLN COMPARISON:  CT chest dated 05/09/2023. CT abdomen/pelvis dated 05/29/2019. FINDINGS: Lower chest: Unchanged 4.2 x 2.2 cm AVM in the right lower lobe. Unchanged 5 mm subpleural AVM in the left lower lobe. Hepatobiliary: No suspicious focal hepatic lesion. Gallbladder is unremarkable. No biliary dilatation. Pancreas: Unremarkable. No pancreatic ductal dilatation or surrounding inflammatory changes. Spleen: Normal in size without focal abnormality. Adrenals/Urinary Tract: Adrenal glands are unremarkable. Kidneys are normal, without renal calculi, suspicious focal lesion, or hydronephrosis. Bladder is unremarkable. Stomach/Bowel: Stomach is within normal limits. Appendix is not clearly identified. No pericecal inflammatory changes. No evidence of bowel wall thickening, distention, or inflammatory changes. Vascular/Lymphatic: Abdominal aorta is normal in caliber with atherosclerotic calcification. Circumaortic left renal vein. No enlarged abdominal or pelvic lymph nodes. Reproductive: Prostate  is unremarkable. Other: No abdominal wall hernia or abnormality. No abdominopelvic ascites. No intraperitoneal free air. Musculoskeletal: No acute osseous abnormality. No suspicious osseous lesion. Levocurvature of the mid lumbar spine with multilevel degenerative disc changes, most pronounced at L5-S1. 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 IMPRESSION: 1. No acute localizing findings in the abdomen or pelvis. Appendix is not clearly identified. No pericecal inflammatory changes. 2. Unchanged bilateral lower lobe pulmonary AVMs. 3. Aortic Atherosclerosis (ICD10-I70.0). Electronically Signed   By: Mannie Seek M.D.   On: 05/31/2024 19:51     Procedures   Medications Ordered in the ED  iohexol  (OMNIPAQUE ) 300 MG/ML solution 100 mL (100 mLs Intravenous Contrast Given 05/31/24 1912)                                    Medical Decision Making Amount and/or Complexity of Data Reviewed Labs: ordered. Radiology: ordered.  Risk Prescription drug management.   This patient presents to the ED for concern of abdominal pain, this involves an extensive number of treatment options, and is a complaint that carries with it a high risk of complications and morbidity.  I considered the following differential and admission for this acute, potentially life threatening condition.  The differential diagnosis includes appendicitis, kidney stone, colitis, bowel obstruction, cholecystitis, aortic aneurysm  MDM:    Patient presents with pain in his right lower abdomen.  I do not appreciate a definite hernia.  He does not have any pain in his testicles.  Labs are nonconcerning.  Urine does not show any evidence of infection or hematuria.  WBC count is normal.  CT scan shows no acute findings.  The appendix was not definitely visualized but no inflammatory changes around the area of the appendix.  I rechecked his abdomen.  He has some mild tenderness in his  right lower quadrant but no peritoneal signs or guarding.  Advised him to use clear liquids for the next 24 hours and have his doctor reassess his abdomen on Monday.  He was given strict return precautions.  (Labs, imaging, consults)  Labs: I Ordered, and personally interpreted labs.  The pertinent results include: WBC count is normal, urine is without infection or hematuria  Imaging Studies ordered: I ordered imaging studies including CT scan I  independently visualized and interpreted imaging. I agree with the radiologist interpretation  Additional history obtained from chart.  External records from outside source obtained and reviewed including prior notes  Cardiac Monitoring: The patient was maintained on a cardiac monitor.  If on the cardiac monitor, I personally viewed and interpreted the cardiac monitored which showed an underlying rhythm of: Sinus rhythm  Reevaluation: After the interventions noted above, I reevaluated the patient and found that they have :improved  Social Determinants of Health:    Disposition: Discharged to home  Co morbidities that complicate the patient evaluation  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  WITHOUT HEMOPTYSIS   History of nonmelanoma skin cancer EXCISION SQUAMOUS CELL FROM HAND   Hospital discharge follow-up 01/18/2023   Hypertrophy of prostate with urinary obstruction and other lower  urinary tract symptoms (LUTS)    ILD (interstitial lung disease) (HCC)    Iron  deficiency anemia due to chronic blood loss 10/08/2014   Irritable bowel syndrome    Laceration of arm, right, multiple sites, initial encounter 10/06/2023   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   Nocturnal leg cramps 06/11/2013   On home O2 01/18/2023   Pain in both lower extremities 10/28/2022   Plantar fascial fibromatosis    Pressure ulcer caused by device 01/07/2023   Pure hypercholesterolemia    RSV (respiratory syncytial virus pneumonia) 01/07/2023   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  Added automatically from request for surgery [id number]   Vaccine counseling 01/24/2023     Medicines Meds ordered this encounter  Medications   iohexol  (OMNIPAQUE ) 300 MG/ML solution 100 mL    I have reviewed the patients home medicines and have made adjustments as needed  Problem List / ED Course: Problem List Items Addressed This Visit   None Visit Diagnoses       Right lower quadrant abdominal pain    -  Primary                Final diagnoses:  Right lower quadrant abdominal pain    ED Discharge Orders     None          Hershel Los, MD 05/31/24 2053

## 2024-05-31 NOTE — ED Triage Notes (Signed)
 RLQ/ groin pain. Hurt when pressure Denies urinary or BM issues Started around noon

## 2024-05-31 NOTE — Telephone Encounter (Signed)
 Spoke with pt, Aware of dr Lourdes Roy recommendations. He will call back in 2 weeks with update.

## 2024-05-31 NOTE — Discharge Instructions (Addendum)
 Use clear liquids for the next few days.  Follow-up with your doctor on Monday to recheck your abdomen.  Return to the emergency room if you have any worsening symptoms.

## 2024-07-02 ENCOUNTER — Emergency Department (HOSPITAL_BASED_OUTPATIENT_CLINIC_OR_DEPARTMENT_OTHER)
Admission: EM | Admit: 2024-07-02 | Discharge: 2024-07-02 | Disposition: A | Discharge status: Home / Self Care | Attending: Emergency Medicine | Admitting: Emergency Medicine

## 2024-07-02 ENCOUNTER — Other Ambulatory Visit: Payer: Self-pay

## 2024-07-02 ENCOUNTER — Emergency Department (HOSPITAL_BASED_OUTPATIENT_CLINIC_OR_DEPARTMENT_OTHER)

## 2024-07-02 ENCOUNTER — Emergency Department (HOSPITAL_BASED_OUTPATIENT_CLINIC_OR_DEPARTMENT_OTHER): Admitting: Radiology

## 2024-07-02 ENCOUNTER — Encounter (HOSPITAL_BASED_OUTPATIENT_CLINIC_OR_DEPARTMENT_OTHER): Payer: Self-pay | Admitting: *Deleted

## 2024-07-02 DIAGNOSIS — Y92480 Sidewalk as the place of occurrence of the external cause: Secondary | ICD-10-CM | POA: Diagnosis not present

## 2024-07-02 DIAGNOSIS — S63501A Unspecified sprain of right wrist, initial encounter: Secondary | ICD-10-CM | POA: Diagnosis not present

## 2024-07-02 DIAGNOSIS — W101XXA Fall (on)(from) sidewalk curb, initial encounter: Secondary | ICD-10-CM | POA: Insufficient documentation

## 2024-07-02 DIAGNOSIS — Z85828 Personal history of other malignant neoplasm of skin: Secondary | ICD-10-CM | POA: Diagnosis not present

## 2024-07-02 DIAGNOSIS — M25531 Pain in right wrist: Secondary | ICD-10-CM | POA: Diagnosis present

## 2024-07-02 DIAGNOSIS — Z79899 Other long term (current) drug therapy: Secondary | ICD-10-CM | POA: Insufficient documentation

## 2024-07-02 DIAGNOSIS — R0781 Pleurodynia: Secondary | ICD-10-CM | POA: Insufficient documentation

## 2024-07-02 DIAGNOSIS — I10 Essential (primary) hypertension: Secondary | ICD-10-CM | POA: Insufficient documentation

## 2024-07-02 DIAGNOSIS — E039 Hypothyroidism, unspecified: Secondary | ICD-10-CM | POA: Insufficient documentation

## 2024-07-02 DIAGNOSIS — Y92009 Unspecified place in unspecified non-institutional (private) residence as the place of occurrence of the external cause: Secondary | ICD-10-CM

## 2024-07-02 DIAGNOSIS — J45909 Unspecified asthma, uncomplicated: Secondary | ICD-10-CM | POA: Insufficient documentation

## 2024-07-02 NOTE — ED Triage Notes (Signed)
 Patient to ED after mechanical fall over a curb on Saturday. No head injury or loss of consciousness. Pain in right hand with obvious swelling. Pain in right elbow and rib cage

## 2024-07-02 NOTE — ED Provider Notes (Signed)
 [facility] AT [facility] Provider Note   CSN: [id number] Arrival date & time: 07/02/24  9074     History {Add pertinent medical, surgical, social history, OB history to HPI:1} Chief Complaint  Patient presents with   Gregory [name] is a 83 y.o. male with PMH as listed below who presents with fall. Patient had mechanical fall over a curb on Saturday. No head injury or loss of consciousness. Pain in right hand with obvious swelling. Pain in right elbow and rib cage .    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  WITHOUT HEMOPTYSIS   History of nonmelanoma skin cancer EXCISION SQUAMOUS CELL FROM HAND   Hospital discharge follow-up 01/18/2023   Hypertrophy of prostate with urinary obstruction and other lower urinary tract symptoms (LUTS)    ILD (interstitial lung disease) (HCC)    Iron  deficiency anemia due to chronic blood loss 10/08/2014   Irritable bowel syndrome    Laceration of arm, right, multiple sites, initial encounter 10/06/2023   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   Nocturnal leg cramps 06/11/2013   On home O2 01/18/2023   Pain in both lower extremities 10/28/2022   Plantar fascial fibromatosis    Pressure ulcer caused by device 01/07/2023   Pure hypercholesterolemia    RSV (respiratory syncytial virus pneumonia) 01/07/2023   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  Added automatically from request for surgery [id number]   Vaccine counseling 01/24/2023       Home Medications Prior to Admission medications   Medication Sig Start Date End Date Taking? Authorizing Provider  acetaminophen  (TYLENOL ) 325 MG tablet Take 2 tablets (650 mg total) by mouth every 6 (six) hours as needed for mild pain (or Fever >/= 101). 01/04/23   Elgergawy, Brayton RAMAN, MD  amoxicillin  (AMOXIL ) 500 MG capsule SMARTSIG:4 Capsule(s) By Mouth PRN for dental visits 04/17/24   Early, [name], NP  Ascorbic Acid  (VITAMIN C ) 1000 MG tablet Take 500 mg by mouth daily.    [provider]  budesonide  (PULMICORT ) 0.5 MG/2ML nebulizer solution TAKE 2 MLS (0.5 MG TOTAL) BY NEBULIZATION AT BEDTIME. 03/19/24   Hunsucker, Donnice JONELLE, MD  Cholecalciferol  125 MCG (5000 UT) capsule Take 5,000 Units by mouth daily.    [provider]  docusate sodium  (COLACE) 100 MG capsule Take 200 mg by mouth at bedtime.    [provider]  gabapentin  (NEURONTIN ) 100 MG capsule TAKE 1 CAPSULE (100 MG TOTAL) BY MOUTH 2 (TWO) TIMES DAILY. 07/27/23   Early,  [name], NP  HYDROcodone  bit-homatropine (HYCODAN) 5-1.5 MG/5ML syrup Take 5 mLs by mouth every 8 (eight) hours as needed for cough. 12/22/23   Oris Camie BRAVO, NP  Iron , Ferrous Sulfate , 325 (65 Fe) MG TABS Take 325 mg by mouth in the morning, at noon, and at bedtime. 08/23/21   Ghimire, [name], MD  levocetirizine (XYZAL ) 5 MG tablet Take 1 tablet (5 mg total) by mouth every evening. 06/27/18   Joshua Debby CROME, MD  levothyroxine  (SYNTHROID )  150 MCG tablet TAKE 1 TABLET BY MOUTH DAILY  BEFORE BREAKFAST 03/01/24   Early, [name], NP  magic mouthwash (nystatin , hydrocortisone , diphenhydrAMINE ) suspension Swish and swallow 5 mLs 3 (three) times daily as needed (mouth and throat irritation). Patient not taking: Reported on 03/27/2024 12/23/22   Early, [name], NP  Magnesium  250 MG TABS Take 250 mg by mouth at bedtime.     [provider]  meclizine  (ANTIVERT ) 25 MG tablet TAKE 1 TABLET (25 MG TOTAL) BY MOUTH 2 (TWO) TIMES DAILY AS NEEDED FOR DIZZINESS. 11/29/23   Early, [name], NP  methocarbamol  (ROBAXIN ) 500 MG tablet Take 1 tablet (500 mg total) by mouth every 8 (eight) hours as needed for muscle spasms. 10/26/21   Joshua Debby CROME, MD  montelukast  (SINGULAIR ) 10 MG tablet TAKE 1 TABLET BY MOUTH AT  BEDTIME 04/30/24   Early, [name], NP  Multiple Vitamin (MULTIVITAMIN WITH MINERALS) TABS tablet Take 1 tablet by mouth every morning.    [provider]  mupirocin  ointment (BACTROBAN ) 2 % Apply 1 Application topically 2 (two) times daily. To affected area. Patient will call when ready to pick up. Patient not taking: Reported on 03/27/2024 10/06/23   Early, [name], NP  pantoprazole  (PROTONIX ) 40 MG tablet TAKE 1 TABLET BY MOUTH TWICE  DAILY 04/30/24   Early, [name], NP  silodosin  (RAPAFLO ) 8 MG CAPS capsule TAKE 1 CAPSULE BY MOUTH AT BEDTIME 05/30/24   Early, [name], NP  sodium chloride  1 g tablet Take 1 tablet (1 g total) by mouth 2 (two) times daily with a meal. Patient taking differently: Take 1 g by mouth daily at 6 (six) AM. 01/24/23   Early, Camie BRAVO, NP      Allergies    Nsaids, Other, Aspirin, Statins, Demeclocycline, Dust mite extract, and Tizanidine hcl    Review of Systems   Review of Systems A 10 point review of systems was performed and is negative unless otherwise reported in HPI.  Physical Exam Updated Vital Signs BP (!) 163/70 (BP Location: Left Arm)   Pulse 79   Resp 12   SpO2 95%  Physical Exam General: Normal  appearing {Desc; male/male:11659}, lying in bed.  HEENT: PERRLA, Sclera anicteric, MMM, trachea midline.  Cardiology: RRR, no murmurs/rubs/gallops. BL radial and DP pulses equal bilaterally.  Resp: Normal respiratory rate and effort. CTAB, no wheezes, rhonchi, crackles.  Abd: Soft, non-tender, non-distended. No rebound tenderness or guarding.  GU: Deferred. MSK: No peripheral edema or signs of trauma. Extremities without deformity or TTP. No cyanosis or clubbing. Skin: warm, dry. No rashes or lesions. Back: No CVA tenderness Neuro: A&Ox4, CNs II-XII grossly intact. MAEs. Sensation grossly intact.  Psych: Normal mood and affect.   ED Results / Procedures / Treatments   Labs (all labs ordered are listed, but only abnormal results are displayed) Labs Reviewed - No data to display  EKG None  Radiology No results found.  Procedures Procedures  {Document cardiac monitor, telemetry assessment procedure  when appropriate:1}  Medications Ordered in ED Medications - No data to display  ED Course/ Medical Decision Making/ A&P                          Medical Decision Making   This patient presents to the ED for concern of ***, this involves an extensive number of treatment options, and is a complaint that carries with it a high risk of complications and morbidity.  I considered the following differential and admission for this acute, potentially life threatening condition.   MDM:    ***     Labs: I Ordered, and personally interpreted labs.  The pertinent results include:  ***  Imaging Studies ordered: I ordered imaging studies including *** I independently visualized and interpreted imaging. I agree with the radiologist interpretation  Additional history obtained from ***.  External records from outside source obtained and reviewed including ***  Cardiac Monitoring: The patient was maintained on a cardiac monitor.  I personally viewed and interpreted the cardiac monitored  which showed an underlying rhythm of: ***  Reevaluation: After the interventions noted above, I reevaluated the patient and found that they have :{resolved/improved/worsened:23923::improved}  Social Determinants of Health: ***  Disposition:  ***  Co morbidities that complicate the patient evaluation  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  WITHOUT HEMOPTYSIS   History of nonmelanoma skin cancer EXCISION SQUAMOUS CELL FROM HAND   Hospital discharge follow-up 01/18/2023   Hypertrophy of prostate with urinary obstruction and other lower urinary tract symptoms (LUTS)    ILD (interstitial lung disease) (HCC)    Iron  deficiency anemia due to chronic blood loss 10/08/2014   Irritable bowel syndrome    Laceration of arm, right, multiple sites, initial encounter 10/06/2023   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   Nocturnal leg cramps 06/11/2013   On home O2 01/18/2023   Pain in both lower extremities 10/28/2022   Plantar fascial fibromatosis    Pressure ulcer caused by device 01/07/2023   Pure hypercholesterolemia    RSV (respiratory syncytial virus pneumonia) 01/07/2023   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  Added automatically from request for surgery [id number]   Vaccine counseling 01/24/2023     Medicines No orders of the defined types were placed in this encounter.   I have reviewed the patients home medicines and have made adjustments as needed  Problem List / ED Course: Problem List Items Addressed This Visit   None        {Document critical  care time when appropriate:1} {Document review of labs and clinical decision tools ie heart score, Chads2Vasc2 etc:1}  {Document your independent review of radiology images, and any outside records:1} {Document your discussion with family members, caretakers, and with consultants:1} {Document social determinants of health affecting pt's care:1} {Document your decision making why or why not admission, treatments were needed:1}  This note was created using dictation software, which may contain spelling or grammatical errors.

## 2024-07-02 NOTE — Discharge Instructions (Signed)
 Thank you for coming to [facility]. You were seen for fall, right wrist pain, right rib pain. We did an exam, and imaging, and these showed no fracture or dislocation.  It is possible that you have a small nondisplaced rib fracture on the right side but is not demonstrated on the x-ray.  Please take Tylenol  and ibuprofen for pain and follow-up with your primary care provider within 1 week.  Rest, ice, and elevation of the right wrist can help reduce swelling and pain.  You can ice it 2-4 times per day for 15-20 minutes, with ice applied not directly on the skin.  Do not hesitate to return to the ED or call 911 if you experience: -Worsening symptoms -Lightheadedness, passing out -Fevers/chills -Anything else that concerns you

## 2024-07-14 ENCOUNTER — Other Ambulatory Visit (HOSPITAL_BASED_OUTPATIENT_CLINIC_OR_DEPARTMENT_OTHER): Payer: Self-pay | Admitting: Nurse Practitioner

## 2024-07-14 DIAGNOSIS — R053 Chronic cough: Secondary | ICD-10-CM

## 2024-07-23 ENCOUNTER — Ambulatory Visit (INDEPENDENT_AMBULATORY_CARE_PROVIDER_SITE_OTHER): Payer: Medicare Other | Admitting: Neurology

## 2024-07-23 ENCOUNTER — Encounter: Payer: Self-pay | Admitting: Neurology

## 2024-07-23 VITALS — BP 169/67 | HR 67 | Ht 69.0 in | Wt 180.0 lb

## 2024-07-23 DIAGNOSIS — R2681 Unsteadiness on feet: Secondary | ICD-10-CM | POA: Diagnosis not present

## 2024-07-23 DIAGNOSIS — G620 Drug-induced polyneuropathy: Secondary | ICD-10-CM

## 2024-07-23 DIAGNOSIS — R4789 Other speech disturbances: Secondary | ICD-10-CM

## 2024-07-23 NOTE — Progress Notes (Signed)
 Follow-up Visit   Date: 07/23/24   Gregory [name] MRN: [id number] DOB: 31-May-1941   Interim History: Gregory [name] is a 83 y.o. right-handed Caucasian male with hypothyroidism, hyperlipidemia, Osler-Weber- Rendu disease, GERD, and left external basal ganglia polymicrobial abscess (2016) returning to the clinic for follow-up of gait unsteadiness, neuropathy, and new complains of neck pain.    The patient was accompanied to the clinic by wife.  IMPRESSION/PLAN: Gait unsteadiness, multifactorial.  Possibly contributed by leg spasticity (history of intracranial abscess in 2016) and neuropathy.  He did not tolerate baclofen  (dizziness).  Fall precations discussed, recommend using a cane especially on uneven ground.   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  - Unfortunately medications are not effective with managing numbness  - He takes gabapentin  100mg  BID for chronic cough  3. 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    4.  Word-finding difficulty.  Patient reassured he does not have signs of dementia. Continue to monitor.  If symptoms progress, the neuropsychological testing can be ordered.   5.  Elevated blood pressure.  Rechecked and still elevated.  He sees his PCP on Wednesday and will discuss this with her.    Return to clinic in 1 year  ------------------------------------------------------- History of present illness: He was hospitalized from May 24 - May 21 2015 with right sided leg weakness and headaches. Patient was found to have a biopsy-proven brain abscess 3 x 2 cm involving the basal ganglia with midline shift and edema seen on CT head.  IV antibiotics including vancomycin , rocephin , and flagyl  was started.  He was also started on keppra  500mg  BID.  Of note, he had routine dental work 2 weeks prior to symptom onset.  He has completed his course of  antibiotics therapy with IV for 6 weeks and transitioned to oral levofloxacin  and metronidazole  for 4 weeks.  Starting ~ late July 2016, he started noticing numbness/tingling of the toes and shooting pain involving the sole of the foot.  Symptoms are constant and involve both feet equally.  Shooting pain is worse with weight bearing.  Nothing alleviates the pain.  He endorses mild low back pain.  These symptoms were thought to be due to flagyl -induced neuropathy and slowly improved.   UPDATE 04/06/2021: He continues to have issues with imbalance, especially with turning.  Fortunately, he has not had any falls. He continues to walk unassisted.  He also has problems with remembering names.  No other memory issues.  He manages finances, medication, drives, and household chores.  Sometimes, he feels that his speech may mumble, especially if he talks fast.  No slurred speech. Last summer, he has a syncopal event at church, thought to be medication-induced.  He has not had any other events like this.  Numbness in the feet is stable and unchanged.  UPDATE 08/04/2022: He is here for follow-up visit.  He reports having a spell of confusion following TURP on 03/02/2022 which lasted 2-3 hours.  During this time, he was confused and unable to think of what he wanted to say and get the words out.  No focal weakness.  CT head did not show acute stroke.  EEG was normal.  BP has been elevated and being monitored by PCP.  He has not had this recur.  He continues to be forgetful with tasks and tends to make a lot of notes, but able to manage all IADLs.  His neuropathy is stable with numbness restricted to the feet.  Balance continues to be a problem.  He has one fall earlier this year from loosing his balance over a threshold. He walks unassisted.  He was going to start PT but this has been delayed.   UPDATE 07/18/2023:  He is here for 1 year follow-up visit.  He reports noticing that his balance is getting worse.  He has one  fall when at home last week and another one earlier this year.  Fortunately, he did not suffer any injuries.  He also has neck pain and stiffness.  No new weakness in the arms or leg.  His neuropathy is unchanged and continues to have numbness/tingling in the feet.  He takes gabapentin  100mg  BID. He was hospitalized in January with RSV and was on oxygen until last month.    UPDATE 01/31/2024:  He is here for sooner than scheduled visit due to spells of syncope.  He reports having three episodes of syncope in the past 5 years and his last occurred on 1/31.  He reports having severe epistaxis with blood flowing out of his nose, which he attempted to control for about 3-4 hours.  He contacted his ENT who advised him to go to the ER.  However, moments after this call, he became lightheaded, disorientated, clammy, and lost consciousness.  EMS was called and BP was 68/40. Duration was 15-20 minutes. By the time, he was in the ambulance, he was getting back to his baseline.  There was no associated jerking, tongue biting, or incontinence.   He has three prior spells, the last occurred when his wife was going to the hospital.  UPDATE 07/23/2024:  He is here for follow-up visit.  He had a fall a few weeks ago while walking in the yard and sprained his right wrist.  He feels that the numbness in the soles of the feet is more intense which makes his balance difficult.  He continues to walk unassisted but endorses being very careful. He is having some word-finding difficulty.  He is able to perform all ADLs and IADLs without driving or assistance.   Medications:  Current Outpatient Medications on File Prior to Visit  Medication Sig Dispense Refill   acetaminophen  (TYLENOL ) 325 MG tablet Take 2 tablets (650 mg total) by mouth every 6 (six) hours as needed for mild pain (or Fever >/= 101).     Ascorbic Acid  (VITAMIN C ) 1000 MG tablet Take 500 mg by mouth daily.     budesonide  (PULMICORT ) 0.5 MG/2ML nebulizer solution  TAKE 2 MLS (0.5 MG TOTAL) BY NEBULIZATION AT BEDTIME. 120 mL 11   Cholecalciferol  125 MCG (5000 UT) capsule Take 5,000 Units by mouth daily.     docusate sodium  (COLACE) 100 MG capsule Take 200 mg by mouth at bedtime.     gabapentin  (NEURONTIN ) 100 MG capsule TAKE 1 CAPSULE BY MOUTH TWICE  DAILY 180 capsule 1   HYDROcodone  bit-homatropine (HYCODAN) 5-1.5 MG/5ML syrup Take 5 mLs by mouth every 8 (eight) hours as needed for cough. 120 mL 0   Iron , Ferrous Sulfate , 325 (65 Fe) MG TABS Take 325 mg by mouth in the morning, at noon, and at bedtime. 30 tablet 2   levocetirizine (XYZAL ) 5 MG tablet Take 1 tablet (5 mg total) by mouth every evening. 90 tablet 1   levothyroxine  (SYNTHROID ) 150 MCG tablet TAKE 1 TABLET BY MOUTH DAILY  BEFORE BREAKFAST 90 tablet 3   Magnesium  250 MG TABS Take  250 mg by mouth at bedtime.      meclizine  (ANTIVERT ) 25 MG tablet TAKE 1 TABLET (25 MG TOTAL) BY MOUTH 2 (TWO) TIMES DAILY AS NEEDED FOR DIZZINESS. 30 tablet 5   methocarbamol  (ROBAXIN ) 500 MG tablet Take 1 tablet (500 mg total) by mouth every 8 (eight) hours as needed for muscle spasms. 270 tablet 0   montelukast  (SINGULAIR ) 10 MG tablet TAKE 1 TABLET BY MOUTH AT  BEDTIME 90 tablet 1   Multiple Vitamin (MULTIVITAMIN WITH MINERALS) TABS tablet Take 1 tablet by mouth every morning.     pantoprazole  (PROTONIX ) 40 MG tablet TAKE 1 TABLET BY MOUTH TWICE  DAILY 180 tablet 1   silodosin  (RAPAFLO ) 8 MG CAPS capsule TAKE 1 CAPSULE BY MOUTH AT BEDTIME 90 capsule 1   sodium chloride  1 g tablet Take 1 tablet (1 g total) by mouth 2 (two) times daily with a meal. 180 tablet 3   amoxicillin  (AMOXIL ) 500 MG capsule SMARTSIG:4 Capsule(s) By Mouth PRN for dental visits (Patient not taking: Reported on 07/23/2024) 4 capsule 6   magic mouthwash (nystatin , hydrocortisone , diphenhydrAMINE ) suspension Swish and swallow 5 mLs 3 (three) times daily as needed (mouth and throat irritation). (Patient not taking: Reported on 07/23/2024) 480 mL 3   No  current facility-administered medications on file prior to visit.    Allergies:  Allergies  Allergen Reactions   Nsaids Other (See Comments)    Cannot take-per MD   Other Other (See Comments)    All blood thinners-cannot take per MD   Aspirin Other (See Comments)    nosebleeds   Statins Other (See Comments)    Weakness, myalgias   Demeclocycline Other (See Comments)    Passed out   Dust Mite Extract Other (See Comments)   Tizanidine Hcl Other (See Comments)    Patient stated that after taking this medication he experienced feelings of dizziness and disorientation.      Vital Signs:  BP (!) 175/70 (BP Location: Left Arm, Patient Position: Sitting, Cuff Size: Normal)   Pulse 67   Ht 5' 9 (1.753 m)   Wt 180 lb (81.6 kg)   BMI 26.58 kg/m   Neurological Exam: MENTAL STATUS including orientation to time, place, person, recent and remote memory, attention span and concentration, language, and fund of knowledge is normal.  Speech is not dysarthric.  CRANIAL NERVES:  Normal fundoscopic exam.  Pupils equal round and reactive to light.  Normal conjugate, extra-ocular eye movements in all directions of gaze.  Right ptosis (old).  Face is symmetric and muscles are intact. Palate elevates symmetrically.  Tongue is midline.  MOTOR:  Motor strength is 5/5 in all extremities.   No pronator drift.  There is mild spasticity in the RUE and RLE (0+)  MSRs:  Reflexes are 3+/4 throughout, brisker on the right side.  SENSORY:  Vibration is reduced at the ankles, intact at the knees.    COORDINATION/GAIT:  Normal finger-to- nose-finger. Intact rapid alternating movements bilaterally.  Gait mildly wide-based, unsteady slightly with turns, unassisted.  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  Stable 4 mm LEFT-to-RIGHT midline shift.   CT head 06/24/2015:  Significant interval improvement when compared to the 05/13/2015 examination.  The ring-enhancing lesion which was centered in the left basal ganglia has decreased significantly  in size. Currently, 2.3 x 0.9 cm region of relatively solid enhancement superior aspect of the left basal ganglia remains as versus prior 3.1 x 1.9 cm ring-enhancing lesion. Significant decrease in surrounding vasogenic edema and marked decrease mass effect upon the left lateral ventricle. What remains may represent residua of treated abscess. This will need to be followup until complete clearance. As this clears it would be helpful to exclude the possibility of underlying vascular abnormality in this patient with Osler-Weber-Rendu.  CT head 08/11/2015: Resolving abscess left external capsule. There is decreased edema and decreased enhancement in this area. No residual fluid collection. No other areas of acute abnormality.  CT head wwo contrast 01/06/2016:  Hypoattenuation in the LEFT basal ganglia and regional white matter is improved from August but not yet normalized. No abnormal postcontrast enhancement to definitively suggest residual infection. Continued surveillance may be warranted, given the patient's residual RIGHT leg weakness and intermittent slurred speech.   NCS/EMG of the legs 09/16/2015: The electrophysiologic findings are most consistent with a generalized sensorimotor polyneuropathy, axon loss in type, affecting the lower extremities. Overall, these findings are severe in degree electrically with respect to sensory responses. 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  Labs 09/11/2015:  vitamin B12 874, folate 24.8, vitamin B1 36, copper  77, vitamin B6 73.8, myasthenia panel negative  CT head 01/21/2017:  No change from the prior CT.  Negative for abscess or mass lesion. Chronic encephalomalacia left external capsule related to prior cerebral abscess.  Routine 03/15/2022:  Normal   CT head 03/03/2022: 1.  No acute intracranial hemorrhage.  2.  No evidence of acute large vascular territory infarct,  large vessel occlusion, or cerebral perfusion disturbance. Because CT is relatively insensitive for the detection of ischemia within the first 24 hours, if high clinical suspicion remains for acute infarction, MRI would be recommended for further evaluation.  3.  No 50% or greater arterial stenosis within the head or neck.  4.  Slight asymmetric hypoattenuation in the left frontal white matter extending towards prior left frontal burr hole, favored to reflect chronic changes related to prior abscess. If high clinical suspicion remains for acute infarction, MRI would be recommended for further evaluation.  5.  Background of chronic small vessel disease and generalized cerebral and cerebellar atrophy.   Total time spent reviewing records, interview, history/exam, documentation, and coordination of care on day of encounter:  30 minutes    Thank you for allowing me to participate in patient's care.  If I can answer any additional questions, I would be pleased to do so.    Sincerely,    Chella Chapdelaine K. Tobie, DO

## 2024-07-25 ENCOUNTER — Ambulatory Visit (INDEPENDENT_AMBULATORY_CARE_PROVIDER_SITE_OTHER): Payer: Medicare Other | Admitting: Nurse Practitioner

## 2024-07-25 ENCOUNTER — Encounter: Payer: Self-pay | Admitting: Nurse Practitioner

## 2024-07-25 VITALS — BP 128/68 | HR 64 | Wt 179.8 lb

## 2024-07-25 DIAGNOSIS — I10 Essential (primary) hypertension: Secondary | ICD-10-CM

## 2024-07-25 DIAGNOSIS — G620 Drug-induced polyneuropathy: Secondary | ICD-10-CM

## 2024-07-25 DIAGNOSIS — J849 Interstitial pulmonary disease, unspecified: Secondary | ICD-10-CM

## 2024-07-25 DIAGNOSIS — S20211A Contusion of right front wall of thorax, initial encounter: Secondary | ICD-10-CM | POA: Diagnosis not present

## 2024-07-25 NOTE — Assessment & Plan Note (Signed)
 Persistent pain in the right posterior rib area following a fall three weeks ago. Imaging shows no fracture, but likely deep bruising and cartilage irritation. Imaging reviewed today. Pain may take several months to fully resolve.  - Apply ice to reduce swelling and use heat for comfort. - Use a small pillow to brace the area for support. - Monitor for worsening symptoms such as increased shortness of breath.

## 2024-07-25 NOTE — Assessment & Plan Note (Signed)
 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 Imaging shows significant changes in the lungs, but oxygen levels remain stable and he is breathing adequately. Lungs are clear today on auscultation with no concerning symptoms.  - Encourage deep breathing exercises to prevent fluid accumulation in the lungs.

## 2024-07-25 NOTE — Patient Instructions (Signed)
 Rib Contusion A rib contusion is a deep bruise on the rib area. Contusions are the result of a blunt trauma that causes bleeding and injury to the tissues under the skin. A rib contusion may involve bruising of the ribs and of the skin and muscles in the area. The skin over the contusion may turn blue, purple, or yellow. Minor injuries result in a painless contusion. More severe contusions may be painful and swollen for a few weeks. What are the causes? This condition is usually caused by a hard, direct hit to an area of the body. This often occurs while playing contact sports. What are the signs or symptoms? Symptoms of this condition include: Swelling and redness of the injured area. Discoloration of the injured area. Tenderness and soreness of the injured area. Pain with or without movement. Pain when breathing in. How is this diagnosed? This condition may be diagnosed based on: Your symptoms and medical history. A physical exam. Imaging tests--such as an X-ray, CT scan, or MRI--to determine if there were internal injuries or broken bones (fractures). How is this treated? This condition may be treated with: Rest. This is often the best treatment for a rib contusion. Ice packs. This reduces swelling and inflammation. Deep-breathing exercises. These may be recommended to reduce the risk for lung collapse and pneumonia. Medicines. Over-the-counter or prescription medicines may be given to control pain. Injection of a numbing medicine around the nerve near your injury (nerve block). Follow these instructions at home: Medicines Take over-the-counter and prescription medicines only as told by your health care provider. Ask your health care provider if the medicine prescribed to you: Requires you to avoid driving or using machinery. Can cause constipation. You may need to take these actions to prevent or treat constipation: Drink enough fluid to keep your urine pale yellow. Take  over-the-counter or prescription medicines. Eat foods that are high in fiber, such as beans, whole grains, and fresh fruits and vegetables. Limit foods that are high in fat and processed sugars, such as fried or sweet foods. Managing pain, stiffness, and swelling If directed, put ice on the injured area. To do this: Put ice in a plastic bag. Place a towel between your skin and the bag. Leave the ice on for 20 minutes, 2-3 times a day. Remove the ice if your skin turns bright red. This is very important. If you cannot feel pain, heat, or cold, you have a greater risk of damage to the area.  Activity Rest the injured area. Avoid strenuous activity and any activities or movements that cause pain. Be careful during activities, and avoid bumping the injured area. Do not lift anything that is heavier than 5 lb (2.3 kg), or the limit that you are told, until your health care provider says that it is safe. General instructions  Do not use any products that contain nicotine or tobacco, such as cigarettes, e-cigarettes, and chewing tobacco. These can delay healing. If you need help quitting, ask your health care provider. Do deep-breathing exercises as told by your health care provider. If you were given an incentive spirometer, use it every 1-2 hours while you are awake, or as recommended by your health care provider. This device measures how well you are filling your lungs with each breath. Keep all follow-up visits. This is important. Contact a health care provider if you have: Increased bruising or swelling. Pain that is not controlled with treatment. A fever. Get help right away if you: Have difficulty breathing or  shortness of breath. Develop a continual cough, or you cough up thick or bloody mucus from your lungs (sputum). Feel nauseous or you vomit. Have pain in your abdomen. These symptoms may represent a serious problem that is an emergency. Do not wait to see if the symptoms will go  away. Get medical help right away. Call your local emergency services (911 in the [place] Do not drive yourself to the hospital. Summary A rib contusion is a deep bruise on your rib area. Contusions are the result of a blunt trauma that causes bleeding and injury to the tissues under the skin. The skin over the contusion may turn blue, purple, or yellow. Minor injuries may cause a painless contusion. More severe contusions may be painful and swollen for a few weeks. Rest the injured area. Avoid strenuous activity and any activities or movements that cause pain. This information is not intended to replace advice given to you by your health care provider. Make sure you discuss any questions you have with your health care provider. Document Revised: 10/24/2023 Document Reviewed: 10/24/2023 Elsevier Patient Education  2024 [employer].

## 2024-07-25 NOTE — Assessment & Plan Note (Signed)
 Blood pressure readings have been variable, with a recent high reading of 175 mmHg at the neurologist's office, but typically in the 130s-140s at home. The high reading may have been a fluke due to the use of an automatic machine. No current antihypertensive medication is being used. - Monitor blood pressure at home and report if readings consistently exceed 150 mmHg.

## 2024-07-25 NOTE — Assessment & Plan Note (Signed)
 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 Neuropathy contributes to balance issues, especially on uneven surfaces. Increasing gabapentin  dosage will not provide additional benefit as advised by neurologist. - Use a cane when walking on uneven surfaces to prevent falls.

## 2024-07-25 NOTE — Progress Notes (Signed)
 Catheline Doing, DNP, AGNP-c [facility]  [address] [place], [place] [address] [phone]  ESTABLISHED PATIENT- Chronic Health and/or Follow-Up Visit  Blood pressure 128/68, pulse 64, weight 179 lb 12.8 oz (81.6 kg).    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  He experienced a fall three weeks ago while attempting to move from the bushes to the sidewalk, attributing it to severe neuropathy with significant loss of sensation in his feet. He describes stepping on something that caused him to lose balance. 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 He is stable on flat ground but struggles on uneven surfaces.  Following the fall, he has been experiencing persistent rib pain, described as severe and located from the front to the back of his ribcage. He reports that an x-ray was performed after the fall and he was told that nothing was broken, but he continues to experience significant discomfort. He uses Biofreeze for relief and notes a raised area on his rib that is painful to touch. Ice is used for swelling and is found to be helpful.  He has a history of interstitial lung disease, and he reports that it has been present for years. No worsening shortness of breath is noted, but he mentions a chronic cough. He is mindful of breathing deeply to prevent fluid accumulation in his lungs.  Regarding neuropathy, he is currently taking gabapentin , which provides some relief. He is considering using a cane for stability on uneven surfaces.  He mentions a recent concern about elevated blood pressure readings at a neurologist visit, where it was recorded at 175 mmHg. However, he typically measures his blood pressure at home, where it ranges in the 130s to 140s.  He is actively trying to maintain his health  by walking two to three days a week, which he believes has contributed to some weight loss.  All ROS negative with exception of what is listed above.   PHYSICAL EXAM Physical Exam Vitals and nursing note reviewed.  Constitutional:      General: He is not in acute distress.    Appearance: Normal appearance. He is not ill-appearing.  HENT:     Head: Normocephalic.  Eyes:     Pupils: Pupils are equal, round, and reactive to light.  Neck:     Vascular: No carotid bruit.  Cardiovascular:     Rate and Rhythm: Normal rate and regular rhythm.     Pulses: Normal pulses.     Heart sounds: Normal heart sounds.  Pulmonary:     Effort: Pulmonary effort is normal.     Breath sounds: Normal breath sounds.  Abdominal:     Palpations: Abdomen is soft.  Musculoskeletal:        General: Tenderness present. Normal range of motion.     Cervical back: No tenderness.     Right lower leg: No edema.     Left lower leg: No edema.     Comments: Right rib cage tender to palpation and with movement of the right arm.   Skin:    General: Skin is warm and dry.     Capillary Refill: Capillary refill takes less than 2 seconds.  Neurological:     General: No focal deficit present.     Mental Status: He is alert and oriented to person, place, and time.  Sensory: Sensory deficit present.     Motor: No weakness.     Gait: Gait normal.  Psychiatric:        Mood and Affect: Mood normal.      PLAN Problem List Items Addressed This Visit     Essential hypertension (Chronic)   Blood pressure readings have been variable, with a recent high reading of 175 mmHg at the neurologist's office, but typically in the 130s-140s at home. The high reading may have been a fluke due to the use of an automatic machine. No current antihypertensive medication is being used. - Monitor blood pressure at home and report if readings consistently exceed 150 mmHg.      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 Neuropathy contributes to balance issues, especially on uneven surfaces. Increasing gabapentin  dosage will not provide additional benefit as advised by neurologist. - Use a cane when walking on uneven surfaces to prevent falls.      Interstitial lung disease (HCC)   Chronic interstitial lung disease with persistent cough. Imaging shows significant changes in the lungs, but oxygen levels remain stable and he is breathing adequately. Lungs are clear today on auscultation with no concerning symptoms.  - Encourage deep breathing exercises to prevent fluid accumulation in the lungs.      Rib contusion, right, initial encounter - Primary   Persistent pain in the right posterior rib area following a fall three weeks ago. Imaging shows no fracture, but likely deep bruising and cartilage irritation. Imaging reviewed today. Pain may take several months to fully resolve.  - Apply ice to reduce swelling and use heat for comfort. - Use a small pillow to brace the area for support. - Monitor for worsening symptoms such as increased shortness of breath.       Return in about 6 months (around 01/25/2025) for Med Management 30.  Catheline Doing, DNP, AGNP-c

## 2024-07-27 ENCOUNTER — Inpatient Hospital Stay: Attending: Oncology

## 2024-07-27 ENCOUNTER — Ambulatory Visit: Payer: Self-pay | Admitting: Oncology

## 2024-07-27 ENCOUNTER — Ambulatory Visit: Admitting: Oncology

## 2024-07-27 DIAGNOSIS — D500 Iron deficiency anemia secondary to blood loss (chronic): Secondary | ICD-10-CM

## 2024-07-27 DIAGNOSIS — D509 Iron deficiency anemia, unspecified: Secondary | ICD-10-CM | POA: Diagnosis present

## 2024-07-27 LAB — CBC WITH DIFFERENTIAL (CANCER CENTER ONLY)
Abs Immature Granulocytes: 0.02 K/uL (ref 0.00–0.07)
Basophils Absolute: 0 K/uL (ref 0.0–0.1)
Basophils Relative: 0 %
Eosinophils Absolute: 0.1 K/uL (ref 0.0–0.5)
Eosinophils Relative: 1 %
HCT: 43.7 % (ref 39.0–52.0)
Hemoglobin: 15.2 g/dL (ref 13.0–17.0)
Immature Granulocytes: 0 %
Lymphocytes Relative: 36 %
Lymphs Abs: 2.7 K/uL (ref 0.7–4.0)
MCH: 31.5 pg (ref 26.0–34.0)
MCHC: 34.8 g/dL (ref 30.0–36.0)
MCV: 90.7 fL (ref 80.0–100.0)
Monocytes Absolute: 0.9 K/uL (ref 0.1–1.0)
Monocytes Relative: 12 %
Neutro Abs: 3.8 K/uL (ref 1.7–7.7)
Neutrophils Relative %: 51 %
Platelet Count: 271 K/uL (ref 150–400)
RBC: 4.82 MIL/uL (ref 4.22–5.81)
RDW: 13.2 % (ref 11.5–15.5)
WBC Count: 7.6 K/uL (ref 4.0–10.5)
nRBC: 0 % (ref 0.0–0.2)

## 2024-07-27 LAB — FERRITIN: Ferritin: 68 ng/mL (ref 24–336)

## 2024-07-27 NOTE — Telephone Encounter (Signed)
 Patient gave verbal understanding and had no further questions or concerns

## 2024-07-27 NOTE — Telephone Encounter (Signed)
-----   Message from Arley Hof sent at 07/27/2024  1:02 PM EDT ----- Please call patient, the iron  level and hemoglobin are normal, follow-up as scheduled  ----- Message ----- From: Rebecka, Lab In Central Sent: 07/27/2024  10:35 AM EDT To: Arley KATHEE Hof, MD

## 2024-08-03 ENCOUNTER — Other Ambulatory Visit: Payer: Self-pay

## 2024-08-03 ENCOUNTER — Telehealth: Payer: Self-pay

## 2024-08-03 ENCOUNTER — Telehealth: Payer: Self-pay | Admitting: Nurse Practitioner

## 2024-08-03 MED ORDER — CICLOPIROX 8 % EX SOLN: Freq: Every day | CUTANEOUS | 3 refills | Status: AC

## 2024-08-03 NOTE — Telephone Encounter (Signed)
 Copied from CRM #[id number]. Topic: General - Other >> Aug 03, 2024  1:51 PM Jasmin G wrote: Reason for CRM: Pt requested a call back from Ms. Melissa, no reason given.

## 2024-08-03 NOTE — Telephone Encounter (Signed)
 They sent admin message too, I already called him. He wanted rx's for him and wife, sent message back to [place]

## 2024-08-03 NOTE — Telephone Encounter (Signed)
 Pt called requesting refill on rx for nail fungus   Ciclopirox    He know you gave to wife before Gerard) back in 2023 but not sure if he had actual rx or not. they both use it and are requesting rx for both  Gregory [name] (New pharmacy)

## 2024-08-07 ENCOUNTER — Encounter: Payer: Self-pay | Admitting: Pulmonary Disease

## 2024-08-07 ENCOUNTER — Ambulatory Visit (INDEPENDENT_AMBULATORY_CARE_PROVIDER_SITE_OTHER): Admitting: Pulmonary Disease

## 2024-08-07 VITALS — BP 145/75 | HR 67 | Temp 97.5°F | Ht 69.0 in | Wt 179.0 lb

## 2024-08-07 DIAGNOSIS — J849 Interstitial pulmonary disease, unspecified: Secondary | ICD-10-CM

## 2024-08-07 DIAGNOSIS — R053 Chronic cough: Secondary | ICD-10-CM | POA: Diagnosis not present

## 2024-08-07 MED ORDER — PROMETHAZINE-DM 6.25-15 MG/5ML PO SYRP: 5.0000 mL | ORAL_SOLUTION | Freq: Every evening | ORAL | 3 refills | Status: DC | PRN

## 2024-08-07 NOTE — Progress Notes (Signed)
 @Patient  ID: Gregory [name], male    DOB: 1941-07-04, 83 y.o.   MRN: [id number]  Chief Complaint  Patient presents with   Follow-up    ILD Pt also states he is experiencing a dry cough, worse at night.     Referring provider: Oris Camie BRAVO, NP  HPI:   83 y.o. 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  Most recent cardiology note reviewed.  Most recent PCP note reviewed.  Overall doing okay.  Nocturnal cough may be a bit worse.  During the day Dexameth or fan covers things.  At night cough is worse wakes him up.  He endorses worsening reflux symptoms along with worsening cough at night.  Cough is dry.  He takes Protonix  twice daily.  Denies burning but does endorse the sensation of regurgitation or reflux.  Had a CT of his abdomen pelvis for abdominal pain 05/2024, this was reviewed, stable AVMs noted.  We discussed cough suppressants as well as GERD is likely ongoing driver of nocturnal cough.  HPI at initial visit: Patient has chronic cough over the last 2 years.  When asked in more detail he admits cough is been present for well over a decade.  Unclear how much change there has been since last seen by pulmonary some years ago.  Does seem that the frequency has worsened over the last couple of years.  Worse at night but occurs throughout the day.  It is nonproductive.  Uses a variety of dextromethorphan  containing products that do help, he rotates through these 2 or 3 products as they seem to wear off and affect.  He is currently on Breo does not seem to help.  Recently resumed gabapentin  at low-dose, 100 mg twice daily with mild improvement.  Notes in the past he was on 200 mg twice daily of gabapentin  with significant improvement however he had syncope in the setting of what sounds like polypharmacy.  Prior pulmonary notes indicate that he is cough improved on Dulera.  He identifies no other alleviating or exacerbating  factors.  No environmental or seasonal factors that contribute to better or worse cough.  No position where things are better or worse.  He is on a PPI twice daily for a long history of GERD.  Does endorse occasional breakthrough symptoms of heartburn, reflux.  Reports endoscopy, manometry, pH probe in the past although these results are all available for review and he does not recall the results.  Reviewed serial CT scans dating back to 2016, with persistent right lower lobe large AVM and small left lower lobe AVM stable from scan 04/2015, 09/2018, 11/2018, 11/2020 with otherwise clear lungs on my interpretation.  PMH: HHT with pulmonary AVMs, hypertension, GERD Surgical history: Brain surgery for brain abscess, Family history: Mother with diabetes, hypertension, father with kidney failure Social history: Lives [place], retired, went to [employer], has 3 grandchildren  Public house manager / Pulmonary Flowsheets:   ACT:      No data to display          MMRC:     No data to display          Epworth:      No data to display          Tests:   FENO:  No results found for: NITRICOXIDE  PFT:    Latest Ref Rng & Units 07/31/2021   10:49 AM  PFT  Results  FVC-Pre L 2.90   FVC-Predicted Pre % 72   FVC-Post L 2.81   FVC-Predicted Post % 69   Pre FEV1/FVC % % 81   Post FEV1/FCV % % 84   FEV1-Pre L 2.34   FEV1-Predicted Pre % 81   FEV1-Post L 2.38   DLCO uncorrected ml/min/mmHg 10.47   DLCO UNC% % 43   DLCO corrected ml/min/mmHg 10.47   DLCO COR %Predicted % 43   DLVA Predicted % 57   TLC L 5.07   TLC % Predicted % 72   RV % Predicted % 85   Personally reviewed and interpreted as normal spirometry, mildly reduced TLC/mild restriction, severely reduced DLCO.  WALK:     04/18/2023    2:16 PM 01/20/2023   11:45 AM 01/06/2023   11:53 AM  SIX MIN WALK  Supplimental Oxygen during Test? (L/min) No Yes Yes  O2 Flow Rate  3 L/min 5 L/min  Type  Continuous Continuous   Tech Comments: patient ambulated on RA at a moderate, steady pace with no stops, no complaints of sob. Patient O2 sats monitored on head probe during walk.  rest and lap #1 were on room air, provided 5L O2 and sats went up   Imaging: Personally reviewed and as per EMR  Lab Results: Personally reviewed CBC    Component Value Date/Time   WBC 7.6 07/27/2024 1022   WBC 7.6 05/31/2024 1745   RBC 4.82 07/27/2024 1022   HGB 15.2 07/27/2024 1022   HGB 14.8 01/27/2024 0848   HCT 43.7 07/27/2024 1022   HCT 44.6 01/27/2024 0848   PLT 271 07/27/2024 1022   PLT 301 01/27/2024 0848   MCV 90.7 07/27/2024 1022   MCV 93 01/27/2024 0848   MCH 31.5 07/27/2024 1022   MCHC 34.8 07/27/2024 1022   RDW 13.2 07/27/2024 1022   RDW 13.0 01/27/2024 0848   LYMPHSABS 2.7 07/27/2024 1022   LYMPHSABS 2.1 01/27/2024 0848   MONOABS 0.9 07/27/2024 1022   EOSABS 0.1 07/27/2024 1022   EOSABS 0.1 01/27/2024 0848   BASOSABS 0.0 07/27/2024 1022   BASOSABS 0.1 01/27/2024 0848    BMET    Component Value Date/Time   NA 132 (L) 05/31/2024 1745   NA 134 01/27/2024 0848   K 4.3 05/31/2024 1745   CL 94 (L) 05/31/2024 1745   CO2 24 05/31/2024 1745   GLUCOSE 87 05/31/2024 1745   BUN 11 05/31/2024 1745   BUN 9 01/27/2024 0848   CREATININE 0.88 05/31/2024 1745   CALCIUM  9.4 05/31/2024 1745   GFRNONAA >60 05/31/2024 1745   GFRAA >60 06/01/2020 1106    BNP    Component Value Date/Time   BNP 37.1 12/30/2022 0548    ProBNP    Component Value Date/Time   PROBNP 126.0 (H) 03/19/2021 0959    Specialty Problems       Pulmonary Problems   Seasonal allergic rhinitis due to pollen   Laryngopharyngeal reflux (LPR)   Interstitial lung disease (HCC)   Influenza A    Allergies  Allergen Reactions   Nsaids Other (See Comments)    Cannot take-per MD   Other Other (See Comments)    All blood thinners-cannot take per MD   Aspirin Other (See Comments)    nosebleeds   Statins Other (See Comments)     Weakness, myalgias   Demeclocycline Other (See Comments)    Passed out   Dust Mite Extract Other (See Comments)   Tizanidine Hcl Other (See Comments)  Patient stated that after taking this medication he experienced feelings of dizziness and disorientation.     Immunization History  Administered Date(s) Administered   Fluad  Quad(high Dose 65+) 08/24/2019, 10/02/2020, 09/11/2021   Fluad  Trivalent(High Dose 65+) 09/13/2023   Influenza Split 09/21/2011, 09/27/2012   Influenza Whole 09/11/2008, 09/11/2009, 09/22/2010   Influenza, High Dose Seasonal PF 09/09/2016, 09/16/2017, 10/03/2018   Influenza, Quadrivalent, Recombinant, Inj, Pf 08/20/2022   Influenza,inj,Quad PF,6+ Mos 10/04/2013, 10/08/2014, 10/02/2015   PFIZER Comirnaty (Gray Top)Covid-19 Tri-Sucrose Vaccine 06/08/2021   PFIZER(Purple Top)SARS-COV-2 Vaccination 01/03/2020, 01/23/2020, 10/18/2020   PNEUMOCOCCAL CONJUGATE-20 04/26/2022   Pfizer Covid-19 Vaccine Bivalent Booster 31yrs & up 10/21/2021   Pfizer(Comirnaty )Fall Seasonal Vaccine 12 years and older 10/06/2022, 08/30/2023   Pneumococcal Conjugate-13 12/07/2016   Pneumococcal Polysaccharide-23 03/12/2010, 06/27/2018   Respiratory Syncytial Virus Vaccine ,Recomb Aduvanted(Arexvy ) 04/19/2023   Tdap 10/04/2013, 10/06/2023   Varicella 12/20/2009   Zoster Recombinant(Shingrix ) 06/24/2022, 10/25/2022   Zoster, Live 09/06/2022    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   Encounter for examination following a fall 10/06/2023   Essential hypertension 12/27/2022   GERD (gastroesophageal reflux disease)    H/O  arteriovenous malformation (AVM) CHRONIC RLL ON CXR  WITHOUT HEMOPTYSIS   History of nonmelanoma skin cancer EXCISION SQUAMOUS CELL FROM HAND   Hospital discharge follow-up 01/18/2023   Hypertrophy of prostate with urinary obstruction and other lower urinary tract symptoms (LUTS)    ILD (interstitial lung disease) (HCC)    Iron  deficiency anemia due to chronic blood loss 10/08/2014   Irritable bowel syndrome    Laceration of arm, right, multiple sites, initial encounter 10/06/2023   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   Nocturnal leg cramps 06/11/2013   On home O2 01/18/2023   Pain in both lower extremities 10/28/2022   Plantar fascial fibromatosis    Pressure ulcer caused by device 01/07/2023   Pure hypercholesterolemia    RSV (respiratory syncytial virus pneumonia) 01/07/2023   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  Added automatically from request for surgery [id number]   Vaccine counseling 01/24/2023    Tobacco History: Social History   Tobacco Use  Smoking Status Never  Smokeless Tobacco Never   Counseling given: Not Answered   Continue to not smoke  Outpatient Encounter Medications as of 08/07/2024  Medication Sig   acetaminophen  (TYLENOL ) 325 MG tablet Take 2 tablets (650 mg total) by mouth every 6 (six) hours as needed for mild pain (or Fever >/= 101).   amoxicillin  (AMOXIL ) 500 MG capsule SMARTSIG:4 Capsule(s) By Mouth PRN for dental visits   Ascorbic Acid  (VITAMIN C ) 1000 MG tablet Take 500 mg by mouth daily.   budesonide  (PULMICORT ) 0.5  MG/2ML nebulizer solution TAKE 2 MLS (0.5 MG TOTAL) BY NEBULIZATION AT  BEDTIME.   Cholecalciferol  125 MCG (5000 UT) capsule Take 5,000 Units by mouth daily.   ciclopirox  (PENLAC ) 8 % solution Apply topically at bedtime. Apply over nail and surrounding skin. Apply daily over previous coat. After seven (7) days, may remove with alcohol and continue cycle.   dextromethorphan  15 MG/5ML syrup Take 15 mg by mouth in the morning and at bedtime.   docusate sodium  (COLACE) 100 MG capsule Take 200 mg by mouth at bedtime.   gabapentin  (NEURONTIN ) 100 MG capsule TAKE 1 CAPSULE BY MOUTH TWICE  DAILY   Iron , Ferrous Sulfate , 325 (65 Fe) MG TABS Take 325 mg by mouth in the morning, at noon, and at bedtime.   levocetirizine (XYZAL ) 5 MG tablet Take 1 tablet (5 mg total) by mouth every evening.   levothyroxine  (SYNTHROID ) 150 MCG tablet TAKE 1 TABLET BY MOUTH DAILY  BEFORE BREAKFAST   Magnesium  250 MG TABS Take 250 mg by mouth at bedtime.    meclizine  (ANTIVERT ) 25 MG tablet TAKE 1 TABLET (25 MG TOTAL) BY MOUTH 2 (TWO) TIMES DAILY AS NEEDED FOR DIZZINESS.   methocarbamol  (ROBAXIN ) 500 MG tablet Take 1 tablet (500 mg total) by mouth every 8 (eight) hours as needed for muscle spasms.   Multiple Vitamin (MULTIVITAMIN WITH MINERALS) TABS tablet Take 1 tablet by mouth every morning.   pantoprazole  (PROTONIX ) 40 MG tablet TAKE 1 TABLET BY MOUTH TWICE  DAILY   promethazine -dextromethorphan  (PROMETHAZINE -DM) 6.25-15 MG/5ML syrup Take 5 mLs by mouth at bedtime as needed for cough.   silodosin  (RAPAFLO ) 8 MG CAPS capsule TAKE 1 CAPSULE BY MOUTH AT BEDTIME   sodium chloride  1 g tablet Take 1 tablet (1 g total) by mouth 2 (two) times daily with a meal.   montelukast  (SINGULAIR ) 10 MG tablet TAKE 1 TABLET BY MOUTH AT  BEDTIME (Patient not taking: Reported on 08/07/2024)   No facility-administered encounter medications on file as of 08/07/2024.     Review of Systems  Review of Systems  N/a Physical Exam  BP (!) 145/75    Pulse 67   Temp (!) 97.5 F (36.4 C)   Ht 5' 9 (1.753 m)   Wt 179 lb (81.2 kg)   SpO2 92% Comment: RA  BMI 26.43 kg/m   Wt Readings from Last 5 Encounters:  08/07/24 179 lb (81.2 kg)  07/25/24 179 lb 12.8 oz (81.6 kg)  07/23/24 180 lb (81.6 kg)  03/27/24 178 lb 11.2 oz (81.1 kg)  03/14/24 184 lb (83.5 kg)    BMI Readings from Last 5 Encounters:  08/07/24 26.43 kg/m  07/25/24 26.55 kg/m  07/23/24 26.58 kg/m  03/27/24 26.39 kg/m  03/14/24 27.17 kg/m     Physical Exam General: Well-appearing, in no acute distress Eyes: EOMI, no icterus Neck: Supple no JVP Cardiovascular: Regular in rhythm, no murmurs Pulmonary: Clear to auscultation bilaterally, no wheezing Abdomen: Nondistended, bowel sounds present MSK: No synovitis, no joint effusion Neuro: Normal gait, no weakness Psych: Normal mood, full affect   Assessment & Plan:   Chronic Cough: Likely multifactorial. Improved with Dulera in past although more recently Breo did not help. Did not improve with high-dose Dulera or low-dose gabapentin .  Has improved historically with prolonged steroid therapy.  Worse after RSV infection.  Trial nebulized budesonide  at night given nocturnal cough major issue, with mild improvement.  Daily dexamethasone  used helpful.  Breakthrough GERD symptoms likely driving nocturnal cough.  Encourage discussed with PCP and GI team.  Try dextromethorphan -promethazine  at night only as needed to  see if it helps with nocturnal cough.  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  ILD: Mild on imaging.  Relatively stable.  Given mild fibrosis discussed risk and benefits of antifibrotic's.  Unfortunately cost prohibitive.    Return in about 6 months (around 02/07/2025) for f/u Dr. Annella.   Donnice JONELLE Annella, MD 08/07/2024

## 2024-08-07 NOTE — Patient Instructions (Signed)
 Nice to see you again  Continue to budesonide  nebulizer as needed as you are  Try promethazine -dextromethorphan  at night.  I wrote a prescription for 5 mL at night as needed.  I would start with 2.5 mL at night as needed.  If you find this is not very effective then increase to 5 mL.  I wrote for the higher dose on the prescription in case you find you need this so that you can get it refilled when needed.  I agree with talking with your PCP as well as your GI team regarding the ongoing reflux despite the Protonix .  I suspect this is the primary driver of your ongoing cough.  Return to clinic in 6 months or sooner as needed with Dr. Annella

## 2024-09-11 ENCOUNTER — Other Ambulatory Visit (INDEPENDENT_AMBULATORY_CARE_PROVIDER_SITE_OTHER)

## 2024-09-11 DIAGNOSIS — Z23 Encounter for immunization: Secondary | ICD-10-CM

## 2024-09-17 NOTE — Progress Notes (Signed)
 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    Admitted 5/16 with headache and right lower extremity weakness. CT of the head demonstrated a brain mass measuring 3 x 2 cm with midline shift and edema.  This was a ring enhancing mass in the left basal ganglia.  He underwent stereotactic biopsy by neurosurgery. He was diagnosed with a brain abscess.  Notes from ID indicate his cultures grew out Peptostreptococcus sp.  Patient was followed by infectious disease in the hospital and placed on vancomycin , Rocephin  and Flagyl .  He was also placed on Keppra  for seizure prophylaxis. 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    Following DC, patient had an episode of elevated heart rate. EMS was called and he was noted to be in atrial flutter. This converted to sinus rhythm spontaneously. Patient placed on toprol . Event monitor showed slow atrial flutter versus ectopic atrial tachycardia.    Monitor July 2021 showed sinus bradycardia, normal sinus rhythm and sinus tachycardia.  Patient did have symptoms of dizziness with monitor in place.  Previously complained of dyspnea.  Hemoglobin found to be 6.8 and he was transfused and placed on iron  with improvement. Carotid Dopplers August 2023 showed no significant stenosis.  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  Echocardiogram February 2025 showed normal LV function, mild left ventricular hypertrophy, grade 1 diastolic dysfunction, mild right ventricular enlargement, mild left atrial enlargement, mild mitral regurgitation, mild to moderate tricuspid regurgitation.  Monitor March 2025 showed sinus rhythm with occasional PAC, short runs of SVT, occasional PVC, 4 and 6 beats of nonsustained ventricular tachycardia.  Since he  was last seen the patient has dyspnea with more extreme activities but not with routine activities. It is relieved with rest. It is not associated with chest pain. There is no orthopnea, PND or pedal edema. There is no syncope or palpitations. There is no exertional chest pain.  Occasional dizziness not related to position.   Current Outpatient Medications  Medication Sig Dispense Refill   acetaminophen  (TYLENOL ) 325 MG tablet Take 2 tablets (650 mg total) by mouth every 6 (six) hours as needed for mild pain (or Fever >/= 101).     amoxicillin  (AMOXIL ) 500 MG capsule SMARTSIG:4 Capsule(s) By Mouth PRN for dental visits 4 capsule 6   Ascorbic Acid  (VITAMIN C ) 1000 MG tablet Take 500 mg by mouth daily.     budesonide  (PULMICORT ) 0.5 MG/2ML nebulizer solution TAKE 2 MLS (0.5 MG TOTAL) BY NEBULIZATION AT BEDTIME. 120 mL 11   Cholecalciferol  125 MCG (5000 UT) capsule Take 5,000 Units by mouth daily.     ciclopirox  (PENLAC ) 8 % solution Apply topically at bedtime. Apply over nail and surrounding skin. Apply daily over previous coat. After seven (7) days, may remove with alcohol and continue cycle. 6.6 mL 3   dextromethorphan  15 MG/5ML syrup Take 15 mg by mouth in the morning and at bedtime.     docusate sodium  (COLACE) 100 MG capsule Take 200 mg by mouth at bedtime.     gabapentin  (NEURONTIN ) 100 MG capsule TAKE 1 CAPSULE BY MOUTH TWICE  DAILY 180 capsule 1   Iron , Ferrous Sulfate , 325 (65 Fe) MG TABS Take 325 mg by mouth in the morning, at noon, and at bedtime. 30 tablet 2   levocetirizine (XYZAL ) 5 MG tablet Take  1 tablet (5 mg total) by mouth every evening. 90 tablet 1   levothyroxine  (SYNTHROID ) 150 MCG tablet TAKE 1 TABLET BY MOUTH DAILY  BEFORE BREAKFAST 90 tablet 3   magic mouthwash (nystatin , hydrocortisone , diphenhydrAMINE ) suspension Swish and swallow 5 mLs 3 (three) times daily as needed (mouth and throat irritation). 480 mL 3   Magnesium  250 MG TABS Take 250 mg by mouth at bedtime.       meclizine  (ANTIVERT ) 25 MG tablet TAKE 1 TABLET (25 MG TOTAL) BY MOUTH 2 (TWO) TIMES DAILY AS NEEDED FOR DIZZINESS. 30 tablet 5   methocarbamol  (ROBAXIN ) 500 MG tablet Take 1 tablet (500 mg total) by mouth every 8 (eight) hours as needed for muscle spasms. 270 tablet 0   Multiple Vitamin (MULTIVITAMIN WITH MINERALS) TABS tablet Take 1 tablet by mouth every morning.     pantoprazole  (PROTONIX ) 40 MG tablet TAKE 1 TABLET BY MOUTH TWICE  DAILY 180 tablet 1   promethazine -dextromethorphan  (PROMETHAZINE -DM) 6.25-15 MG/5ML syrup Take 5 mLs by mouth at bedtime as needed for cough. 240 mL 3   silodosin  (RAPAFLO ) 8 MG CAPS capsule TAKE 1 CAPSULE BY MOUTH AT BEDTIME 90 capsule 1   sodium chloride  1 g tablet Take 1 tablet (1 g total) by mouth 2 (two) times daily with a meal. 180 tablet 3   montelukast  (SINGULAIR ) 10 MG tablet TAKE 1 TABLET BY MOUTH AT  BEDTIME (Patient not taking: Reported on 10/01/2024) 90 tablet 1   No current facility-administered medications for this visit.     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   Encounter for examination following a fall 10/06/2023   Essential hypertension 12/27/2022   GERD (gastroesophageal reflux disease)    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  WITHOUT HEMOPTYSIS   History of nonmelanoma skin cancer EXCISION SQUAMOUS CELL FROM HAND   Hospital discharge follow-up 01/18/2023   Hypertrophy of prostate with urinary obstruction and other lower urinary tract symptoms (LUTS)    ILD (interstitial lung disease) (HCC)    Iron  deficiency anemia due to chronic blood loss 10/08/2014    Irritable bowel syndrome    Laceration of arm, right, multiple sites, initial encounter 10/06/2023   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   Nocturnal leg cramps 06/11/2013   On home O2 01/18/2023   Pain in both lower extremities 10/28/2022   Plantar fascial fibromatosis    Pressure ulcer caused by device 01/07/2023   Pure hypercholesterolemia    RSV (respiratory syncytial virus pneumonia) 01/07/2023   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  Added automatically from request for surgery [id number]   Vaccine counseling 01/24/2023    Past Surgical History:  Procedure Laterality Date   APPLICATION OF CRANIAL NAVIGATION N/A 05/16/2015  Procedure: APPLICATION OF CRANIAL NAVIGATION;  Surgeon: Gerldine Maizes, MD;  Location: [facility];  Service: Neurosurgery;  Laterality: N/A;   BRAIN BIOPSY Left 05/16/2015   Procedure: Stereotactic Left Brain Biopsy with Brain Lab;  Surgeon: Gerldine Maizes, MD;  Location: [facility];  Service: Neurosurgery;  Laterality: Left;  Stereotactic Left Brain biopsy with brainlab   BRAIN SURGERY     CARDIOVASCULAR STRESS TEST  01/14/2003   NO ISCHEMIA / EF 61%/ NORMAL LE WALL MOTION   EXCISION LEFT WRIST GANGLIAN/ MYXOID CYST  05/30/2009   IR RADIOLOGIST EVAL & MGMT  02/01/2019   NASAL SINUS SURGERY     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 Tricia    TRANSTHORACIC ECHOCARDIOGRAM  10-17-2008   DR Zeynab Klett   NORMAL LVF/ EF 60%/  MILDLY DILATED RIGHT ATRIUM/ VENTRICULE   VARICOCELECTOMY  1991   Dr. Tanda Moats    Social History   Socioeconomic History   Marital status: Married    Spouse name: Jenkins   Number of children: 2   Years of education: Not on file   Highest education level: Master's degree (e.g., MA, MS, MEng, MEd, MSW, MBA)  Occupational History    Comment: Consultant   Occupation: retired  Tobacco Use   Smoking status: Never   Smokeless tobacco: Never  Vaping Use   Vaping status: Never Used  Substance and Sexual Activity   Alcohol use: No    Alcohol/week: 0.0 standard drinks of alcohol   Drug use: No   Sexual activity: Not on file  Other Topics Concern   Not on file  Social History Narrative   Lives with wife in a one story home.  Has 2 sons.  Retired Equities trader.  Education: Masters degree.      05/09/19- Pt states that his balance is worse since last visit. He has not fallen but has come close on a few occassions. The weakness on his right side is the same, he states.      Right Handed   Drinks caffeine    Social Drivers of Health   Financial Resource Strain: Low Risk  (02/28/2024)   Overall Financial Resource Strain (CARDIA)    Difficulty of Paying Living Expenses: Not hard at all  Food Insecurity: No Food Insecurity (02/28/2024)   Hunger Vital Sign    Worried About Running Out of Food in the Last Year: Never true    Ran Out of Food in the Last Year: Never true  Transportation Needs: No Transportation Needs (02/28/2024)   PRAPARE - Administrator, Civil Service (Medical): No    Lack of Transportation (Non-Medical): No  Physical Activity: Inactive (02/28/2024)   Exercise Vital Sign    Days of Exercise per Week: 0 days    Minutes of Exercise per Session: 0 min  Stress: No Stress Concern Present (02/28/2024)   [employer] of Occupational Health - Occupational Stress Questionnaire     Feeling of Stress : Not at all  Social Connections: Moderately Integrated (02/28/2024)   Social Connection and Isolation Panel    Frequency of Communication with Friends and Family: More than three times a week    Frequency of Social Gatherings with Friends and Family: Once a week  Attends Religious Services: More than 4 times per year    Active Member of Clubs or Organizations: No    Attends Banker Meetings: Never    Marital Status: Married  Catering manager Violence: Not At Risk (02/28/2024)   Humiliation, Afraid, Rape, and Kick questionnaire    Fear of Current or Ex-Partner: No    Emotionally Abused: No    Physically Abused: No    Sexually Abused: No    Family History  Problem Relation Age of Onset   Diabetes Mother    Hypertension Mother    Pancreatic cancer Mother    Stroke Mother    Kidney failure Father    Hypertension Sister    Healthy Son     ROS: no fevers or chills, productive cough, hemoptysis, dysphasia, odynophagia, melena, hematochezia, dysuria, hematuria, rash, seizure activity, orthopnea, PND, pedal edema, claudication. Remaining systems are negative.  Physical Exam: Well-developed well-nourished in no acute distress.  Skin is warm and dry.  HEENT is normal.  Neck is supple.  Chest is clear to auscultation with normal expansion.  Cardiovascular exam is regular rate and rhythm.  Abdominal exam nontender or distended. No masses palpated. Extremities show no edema. neuro grossly intact  EKG Interpretation Date/Time:  Monday October 01 2024 10:03:40 EDT Ventricular Rate:  65 PR Interval:  164 QRS Duration:  96 QT Interval:  444 QTC Calculation: 461 R Axis:   -33  Text Interpretation: Normal sinus rhythm Left axis deviation Confirmed by Pietro Rogue ([id number]) on 10/01/2024 10:17:13 AM    A/P  1 syncope-felt to be orthostatic mediated.  Will allow blood pressure to run higher to hopefully prevent recurrences.  2 history of atrial  flutter versus ectopic atrial tachycardia-no recurrences based on history.  He is not on anticoagulation due to history of Osler-Weber-Rendu and AV malformation/GI bleeding as well as recurrent syncope.  3 hyperlipidemia-intolerant to Zetia  and statins.  Will check lipids.  Can consider PCSK9 inhibitor if elevated.  4 coronary calcification- Intolerant to zetia  and statins.  Rogue Pietro, MD

## 2024-09-18 ENCOUNTER — Ambulatory Visit: Payer: Self-pay | Admitting: Pharmacist

## 2024-09-18 NOTE — Progress Notes (Signed)
 Per previous Ofev BIV encounter, patient elected not to start Ofev. NFN

## 2024-09-25 ENCOUNTER — Telehealth: Payer: Self-pay | Admitting: Nurse Practitioner

## 2024-09-25 NOTE — Telephone Encounter (Unsigned)
 Copied from CRM #[id number]. Topic: Clinical - Medication Refill >> Sep 25, 2024  2:17 PM Thersia C wrote: Medication: magic mouthwash (nystatin , hydrocortisone , diphenhydrAMINE ) suspension  Has the patient contacted their pharmacy? Yes (Agent: If no, request that the patient contact the pharmacy for the refill. If patient does not wish to contact the pharmacy document the reason why and proceed with request.) (Agent: If yes, when and what did the pharmacy advise?)  This is the patient's preferred pharmacy:   [facility] - [place], [place] - [address] [place] [address] Phone: [phone] Fax: [phone]  Is this the correct pharmacy for this prescription? Yes If no, delete pharmacy and type the correct one.   Has the prescription been filled recently? No  Is the patient out of the medication? Yes  Has the patient been seen for an appointment in the last year OR does the patient have an upcoming appointment? Yes  Can we respond through MyChart? Yes  Agent: Please be advised that Rx refills may take up to 3 business days. We ask that you follow-up with your pharmacy.

## 2024-09-25 NOTE — Telephone Encounter (Signed)
 magic mouthwash (nystatin , hydrocortisone , diphenhydrAMINE ) suspension not on active med list

## 2024-09-26 ENCOUNTER — Other Ambulatory Visit: Payer: Self-pay

## 2024-09-26 DIAGNOSIS — J392 Other diseases of pharynx: Secondary | ICD-10-CM

## 2024-09-26 MED ORDER — NYSTATIN 100000 UNIT/ML MT SUSP: 5.0000 mL | Freq: Three times a day (TID) | OROMUCOSAL | 3 refills | Status: DC | PRN

## 2024-09-30 ENCOUNTER — Other Ambulatory Visit (HOSPITAL_BASED_OUTPATIENT_CLINIC_OR_DEPARTMENT_OTHER): Payer: Self-pay | Admitting: Nurse Practitioner

## 2024-09-30 DIAGNOSIS — K219 Gastro-esophageal reflux disease without esophagitis: Secondary | ICD-10-CM

## 2024-09-30 DIAGNOSIS — J4530 Mild persistent asthma, uncomplicated: Secondary | ICD-10-CM

## 2024-10-01 ENCOUNTER — Encounter: Payer: Self-pay | Admitting: Cardiology

## 2024-10-01 ENCOUNTER — Other Ambulatory Visit: Payer: Self-pay | Admitting: Nurse Practitioner

## 2024-10-01 ENCOUNTER — Ambulatory Visit: Attending: Cardiology | Admitting: Cardiology

## 2024-10-01 VITALS — BP 128/70 | HR 65 | Ht 69.0 in | Wt 178.6 lb

## 2024-10-01 DIAGNOSIS — E7800 Pure hypercholesterolemia, unspecified: Secondary | ICD-10-CM | POA: Insufficient documentation

## 2024-10-01 DIAGNOSIS — E785 Hyperlipidemia, unspecified: Secondary | ICD-10-CM | POA: Insufficient documentation

## 2024-10-01 DIAGNOSIS — I10 Essential (primary) hypertension: Secondary | ICD-10-CM | POA: Insufficient documentation

## 2024-10-01 DIAGNOSIS — J392 Other diseases of pharynx: Secondary | ICD-10-CM

## 2024-10-01 DIAGNOSIS — R55 Syncope and collapse: Secondary | ICD-10-CM | POA: Insufficient documentation

## 2024-10-01 DIAGNOSIS — I4892 Unspecified atrial flutter: Secondary | ICD-10-CM | POA: Insufficient documentation

## 2024-10-01 MED ORDER — NYSTATIN 100000 UNIT/ML MT SUSP: 5.0000 mL | Freq: Three times a day (TID) | OROMUCOSAL | 3 refills | Status: DC | PRN

## 2024-10-01 NOTE — Patient Instructions (Signed)
 Medication Instructions:  Your physician recommends that you continue on your current medications as directed. Please refer to the Current Medication list given to you today.  *If you need a refill on your cardiac medications before your next appointment, please call your pharmacy*  Lab Work: Fasting Lipid Panel at any [facility]  If you have labs (blood work) drawn today and your tests are completely normal, you will receive your results only by: MyChart Message (if you have MyChart) OR A paper copy in the mail If you have any lab test that is abnormal or we need to change your treatment, we will call you to review the results.  Testing/Procedures: NONE  Follow-Up: At [facility], you and your health needs are our priority.  As part of our continuing mission to provide you with exceptional heart care, our providers are all part of one team.  This team includes your primary Cardiologist (physician) and Advanced Practice Providers or APPs (Physician Assistants and Nurse Practitioners) who all work together to provide you with the care you need, when you need it.  Your next appointment:   6 month(s)  Provider:   Redell Shallow, MD

## 2024-10-02 ENCOUNTER — Other Ambulatory Visit (HOSPITAL_BASED_OUTPATIENT_CLINIC_OR_DEPARTMENT_OTHER): Payer: Self-pay

## 2024-10-02 ENCOUNTER — Other Ambulatory Visit: Payer: Self-pay

## 2024-10-02 ENCOUNTER — Telehealth: Payer: Self-pay | Admitting: Nurse Practitioner

## 2024-10-02 DIAGNOSIS — E785 Hyperlipidemia, unspecified: Secondary | ICD-10-CM

## 2024-10-02 DIAGNOSIS — I5032 Chronic diastolic (congestive) heart failure: Secondary | ICD-10-CM

## 2024-10-02 DIAGNOSIS — E7800 Pure hypercholesterolemia, unspecified: Secondary | ICD-10-CM

## 2024-10-02 MED ORDER — COMIRNATY 30 MCG/0.3ML IM SUSY
0.3000 mL | PREFILLED_SYRINGE | Freq: Once | INTRAMUSCULAR | 0 refills | Status: AC
  Filled 2024-10-02: qty 0.3, 1d supply, fill #0

## 2024-10-02 NOTE — Telephone Encounter (Signed)
 Copied from CRM [id number]. Topic: Appointments - Scheduling Inquiry for Clinic >> Oct 02, 2024 10:37 AM Gregory [name] wrote: Reason for CRM: Patient is requesting to schedule a lab appointment, orders in his chart are from his cardiologist.  Patient can be reached at [phone]

## 2024-10-03 ENCOUNTER — Ambulatory Visit (INDEPENDENT_AMBULATORY_CARE_PROVIDER_SITE_OTHER)

## 2024-10-03 ENCOUNTER — Ambulatory Visit (INDEPENDENT_AMBULATORY_CARE_PROVIDER_SITE_OTHER): Admitting: Pulmonary Disease

## 2024-10-03 ENCOUNTER — Encounter: Payer: Self-pay | Admitting: Pulmonary Disease

## 2024-10-03 ENCOUNTER — Ambulatory Visit: Payer: Self-pay

## 2024-10-03 VITALS — BP 148/73 | HR 69 | Ht 69.0 in | Wt 182.0 lb

## 2024-10-03 DIAGNOSIS — J849 Interstitial pulmonary disease, unspecified: Secondary | ICD-10-CM

## 2024-10-03 DIAGNOSIS — R051 Acute cough: Secondary | ICD-10-CM | POA: Diagnosis not present

## 2024-10-03 MED ORDER — PREDNISONE 20 MG PO TABS: ORAL_TABLET | ORAL | 0 refills | Status: AC

## 2024-10-03 MED ORDER — AZITHROMYCIN 250 MG PO TABS: ORAL_TABLET | ORAL | 0 refills | Status: AC

## 2024-10-03 NOTE — Progress Notes (Signed)
 @Patient  ID: Gregory [name], male    DOB: 1941-07-27, 83 y.o.   MRN: [id number]  Chief Complaint  Patient presents with   Medical Management of Chronic Issues    Acute - congestion / CC pm / tightness, deep breath hurts     Referring provider: Oris Camie BRAVO, NP  HPI:   83 y.o. 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  Here for an acute visit with worsening cough and chest tightness, chest discomfort.  1 week of symptoms.  Increased cough.  Worse than usual.  Also with chest tightness.  Chest discomfort.  A bit worse when he takes a deep breath.  A bit of shortness of breath.  Oxygen saturation unchanged.  HPI at initial visit: Patient has chronic cough over the last 2 years.  When asked in more detail he admits cough is been present for well over a decade.  Unclear how much change there has been since last seen by pulmonary some years ago.  Does seem that the frequency has worsened over the last couple of years.  Worse at night but occurs throughout the day.  It is nonproductive.  Uses a variety of dextromethorphan  containing products that do help, he rotates through these 2 or 3 products as they seem to wear off and affect.  He is currently on Breo does not seem to help.  Recently resumed gabapentin  at low-dose, 100 mg twice daily with mild improvement.  Notes in the past he was on 200 mg twice daily of gabapentin  with significant improvement however he had syncope in the setting of what sounds like polypharmacy.  Prior pulmonary notes indicate that he is cough improved on Dulera.  He identifies no other alleviating or exacerbating factors.  No environmental or seasonal factors that contribute to better or worse cough.  No position where things are better or worse.  He is on a PPI twice daily for a long history of GERD.  Does endorse occasional breakthrough symptoms of heartburn, reflux.  Reports endoscopy, manometry, pH probe in  the past although these results are all available for review and he does not recall the results.  Reviewed serial CT scans dating back to 2016, with persistent right lower lobe large AVM and small left lower lobe AVM stable from scan 04/2015, 09/2018, 11/2018, 11/2020 with otherwise clear lungs on my interpretation.  PMH: HHT with pulmonary AVMs, hypertension, GERD Surgical history: Brain surgery for brain abscess, Family history: Mother with diabetes, hypertension, father with kidney failure Social history: Lives in [place], retired, went to [employer], has 3 grandchildren  Public house manager / Pulmonary Flowsheets:   ACT:      No data to display          MMRC:     No data to display          Epworth:      No data to display          Tests:   FENO:  No results found for: NITRICOXIDE  PFT:    Latest Ref Rng & Units 07/31/2021   10:49 AM  PFT Results  FVC-Pre L 2.90   FVC-Predicted Pre % 72   FVC-Post L 2.81   FVC-Predicted Post % 69   Pre FEV1/FVC % % 81   Post FEV1/FCV % % 84   FEV1-Pre L 2.34   FEV1-Predicted Pre % 81   FEV1-Post L 2.38   DLCO  uncorrected ml/min/mmHg 10.47   DLCO UNC% % 43   DLCO corrected ml/min/mmHg 10.47   DLCO COR %Predicted % 43   DLVA Predicted % 57   TLC L 5.07   TLC % Predicted % 72   RV % Predicted % 85   Personally reviewed and interpreted as normal spirometry, mildly reduced TLC/mild restriction, severely reduced DLCO.  WALK:     04/18/2023    2:16 PM 01/20/2023   11:45 AM 01/06/2023   11:53 AM  SIX MIN WALK  Supplimental Oxygen during Test? (L/min) No Yes Yes  O2 Flow Rate  3 L/min 5 L/min  Type  Continuous Continuous  Tech Comments: patient ambulated on RA at a moderate, steady pace with no stops, no complaints of sob. Patient O2 sats monitored on head probe during walk.  rest and lap #1 were on room air, provided 5L O2 and sats went up   Imaging: Personally reviewed and as per EMR  Lab Results: Personally  reviewed CBC    Component Value Date/Time   WBC 7.6 07/27/2024 1022   WBC 7.6 05/31/2024 1745   RBC 4.82 07/27/2024 1022   HGB 15.2 07/27/2024 1022   HGB 14.8 01/27/2024 0848   HCT 43.7 07/27/2024 1022   HCT 44.6 01/27/2024 0848   PLT 271 07/27/2024 1022   PLT 301 01/27/2024 0848   MCV 90.7 07/27/2024 1022   MCV 93 01/27/2024 0848   MCH 31.5 07/27/2024 1022   MCHC 34.8 07/27/2024 1022   RDW 13.2 07/27/2024 1022   RDW 13.0 01/27/2024 0848   LYMPHSABS 2.7 07/27/2024 1022   LYMPHSABS 2.1 01/27/2024 0848   MONOABS 0.9 07/27/2024 1022   EOSABS 0.1 07/27/2024 1022   EOSABS 0.1 01/27/2024 0848   BASOSABS 0.0 07/27/2024 1022   BASOSABS 0.1 01/27/2024 0848    BMET    Component Value Date/Time   NA 132 (L) 05/31/2024 1745   NA 134 01/27/2024 0848   K 4.3 05/31/2024 1745   CL 94 (L) 05/31/2024 1745   CO2 24 05/31/2024 1745   GLUCOSE 87 05/31/2024 1745   BUN 11 05/31/2024 1745   BUN 9 01/27/2024 0848   CREATININE 0.88 05/31/2024 1745   CALCIUM  9.4 05/31/2024 1745   GFRNONAA >60 05/31/2024 1745   GFRAA >60 06/01/2020 1106    BNP    Component Value Date/Time   BNP 37.1 12/30/2022 0548    ProBNP    Component Value Date/Time   PROBNP 126.0 (H) 03/19/2021 0959    Specialty Problems       Pulmonary Problems   Seasonal allergic rhinitis due to pollen   Laryngopharyngeal reflux (LPR)   Interstitial lung disease (HCC)   Influenza A    Allergies  Allergen Reactions   Nsaids Other (See Comments)    Cannot take-per MD   Other Other (See Comments)    All blood thinners-cannot take per MD   Aspirin Other (See Comments)    nosebleeds   Statins Other (See Comments)    Weakness, myalgias   Demeclocycline Other (See Comments)    Passed out   Dust Mite Extract Other (See Comments)   Tizanidine Hcl Other (See Comments)    Patient stated that after taking this medication he experienced feelings of dizziness and disorientation.     Immunization History  Administered  Date(s) Administered   Fluad  Quad(high Dose 65+) 08/24/2019, 10/02/2020, 09/11/2021   Fluad  Trivalent(High Dose 65+) 09/13/2023   INFLUENZA, HIGH DOSE SEASONAL PF 09/09/2016, 09/16/2017, 10/03/2018, 09/11/2024  Influenza Split 09/21/2011, 09/27/2012   Influenza Whole 09/11/2008, 09/11/2009, 09/22/2010   Influenza, Quadrivalent, Recombinant, Inj, Pf 08/20/2022   Influenza,inj,Quad PF,6+ Mos 10/04/2013, 10/08/2014, 10/02/2015   PFIZER Comirnaty (Gray Top)Covid-19 Tri-Sucrose Vaccine 06/08/2021   PFIZER(Purple Top)SARS-COV-2 Vaccination 01/03/2020, 01/23/2020, 10/18/2020   PNEUMOCOCCAL CONJUGATE-20 04/26/2022   Pfizer Covid-19 Vaccine Bivalent Booster 5yrs & up 10/21/2021   Pfizer(Comirnaty )Fall Seasonal Vaccine 12 years and older 10/06/2022, 08/30/2023, 10/02/2024   Pneumococcal Conjugate-13 12/07/2016   Pneumococcal Polysaccharide-23 03/12/2010, 06/27/2018   Respiratory Syncytial Virus Vaccine ,Recomb Aduvanted(Arexvy ) 04/19/2023   Tdap 10/04/2013, 10/06/2023   Varicella 12/20/2009   Zoster Recombinant(Shingrix ) 06/24/2022, 10/25/2022   Zoster, Live 09/06/2022    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   Encounter for examination following a fall 10/06/2023   Essential hypertension 12/27/2022   GERD (gastroesophageal reflux disease)    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  WITHOUT HEMOPTYSIS   History of nonmelanoma skin cancer EXCISION SQUAMOUS CELL FROM HAND   Hospital discharge follow-up 01/18/2023   Hypertrophy of prostate with urinary obstruction and other lower urinary tract symptoms (LUTS)    ILD  (interstitial lung disease) (HCC)    Iron  deficiency anemia due to chronic blood loss 10/08/2014   Irritable bowel syndrome    Laceration of arm, right, multiple sites, initial encounter 10/06/2023   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   Nocturnal leg cramps 06/11/2013   On home O2 01/18/2023   Pain in both lower extremities 10/28/2022   Plantar fascial fibromatosis    Pressure ulcer caused by device 01/07/2023   Pure hypercholesterolemia    RSV (respiratory syncytial virus pneumonia) 01/07/2023   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  Added automatically from request for surgery [id number]   Vaccine counseling 01/24/2023    Tobacco History: Social History   Tobacco Use  Smoking Status Never  Smokeless Tobacco Never   Counseling given: Not Answered   Continue to not smoke  Outpatient Encounter Medications as of 10/03/2024  Medication Sig   acetaminophen  (TYLENOL ) 325 MG tablet Take 2 tablets (650 mg total) by mouth every 6 (six) hours as needed for mild pain (or Fever >/= 101).   amoxicillin  (AMOXIL ) 500 MG capsule SMARTSIG:4 Capsule(s) By Mouth PRN for dental visits   Ascorbic Acid  (VITAMIN C ) 1000 MG tablet Take 500 mg by mouth daily.   azithromycin  (ZITHROMAX ) 250 MG tablet Take 2 tablets (500 mg total) by mouth daily for 1 day, THEN 1 tablet (250 mg total) daily for 4 days.   budesonide  (PULMICORT ) 0.5 MG/2ML nebulizer solution TAKE 2 MLS (0.5 MG TOTAL) BY NEBULIZATION AT BEDTIME.   Cholecalciferol  125 MCG (5000 UT) capsule Take 5,000 Units by mouth  daily.   ciclopirox  (PENLAC ) 8 % solution Apply topically  at bedtime. Apply over nail and surrounding skin. Apply daily over previous coat. After seven (7) days, may remove with alcohol and continue cycle.   COVID-19 mRNA vaccine, Pfizer, (COMIRNATY ) syringe Inject 0.3 mLs into the muscle once for 1 dose.   dextromethorphan  15 MG/5ML syrup Take 15 mg by mouth in the morning and at bedtime.   docusate sodium  (COLACE) 100 MG capsule Take 200 mg by mouth at bedtime.   gabapentin  (NEURONTIN ) 100 MG capsule TAKE 1 CAPSULE BY MOUTH TWICE  DAILY   Iron , Ferrous Sulfate , 325 (65 Fe) MG TABS Take 325 mg by mouth in the morning, at noon, and at bedtime.   levocetirizine (XYZAL ) 5 MG tablet Take 1 tablet (5 mg total) by mouth every evening.   levothyroxine  (SYNTHROID ) 150 MCG tablet TAKE 1 TABLET BY MOUTH DAILY  BEFORE BREAKFAST   magic mouthwash (nystatin , hydrocortisone , diphenhydrAMINE ) suspension Swish and swallow 5 mLs 3 (three) times daily as needed (mouth and throat irritation).   Magnesium  250 MG TABS Take 250 mg by mouth at bedtime.    meclizine  (ANTIVERT ) 25 MG tablet TAKE 1 TABLET (25 MG TOTAL) BY MOUTH 2 (TWO) TIMES DAILY AS NEEDED FOR DIZZINESS.   methocarbamol  (ROBAXIN ) 500 MG tablet Take 1 tablet (500 mg total) by mouth every 8 (eight) hours as needed for muscle spasms.   montelukast  (SINGULAIR ) 10 MG tablet TAKE 1 TABLET BY MOUTH AT  BEDTIME   Multiple Vitamin (MULTIVITAMIN WITH MINERALS) TABS tablet Take 1 tablet by mouth every morning.   pantoprazole  (PROTONIX ) 40 MG tablet TAKE 1 TABLET BY MOUTH TWICE  DAILY   predniSONE  (DELTASONE ) 20 MG tablet Take 2 tablets (40 mg total) by mouth daily with breakfast for 5 days, THEN 1 tablet (20 mg total) daily with breakfast for 5 days.   promethazine -dextromethorphan  (PROMETHAZINE -DM) 6.25-15 MG/5ML syrup Take 5 mLs by mouth at bedtime as needed for cough.   silodosin  (RAPAFLO ) 8 MG CAPS capsule TAKE 1 CAPSULE BY MOUTH AT BEDTIME   sodium chloride  1  g tablet Take 1 tablet (1 g total) by mouth 2 (two) times daily with a meal.   No facility-administered encounter medications on file as of 10/03/2024.     Review of Systems  Review of Systems  N/a Physical Exam  BP (!) 148/73   Pulse 69   Ht 5' 9 (1.753 m)   Wt 182 lb (82.6 kg)   SpO2 93%   BMI 26.88 kg/m   Wt Readings from Last 5 Encounters:  10/03/24 182 lb (82.6 kg)  10/01/24 178 lb 9.6 oz (81 kg)  08/07/24 179 lb (81.2 kg)  07/25/24 179 lb 12.8 oz (81.6 kg)  07/23/24 180 lb (81.6 kg)    BMI Readings from Last 5 Encounters:  10/03/24 26.88 kg/m  10/01/24 26.37 kg/m  08/07/24 26.43 kg/m  07/25/24 26.55 kg/m  07/23/24 26.58 kg/m     Physical Exam General: Well-appearing, in no acute distress Eyes: EOMI, no icterus Neck: Supple no JVP Cardiovascular: Regular in rhythm, no murmurs Pulmonary: Clear to auscultation bilaterally, no wheezing Abdomen: Nondistended, bowel sounds present MSK: No synovitis, no joint effusion Neuro: Normal gait, no weakness Psych: Normal mood, full affect   Assessment & Plan:   Acute cough, chest discomfort: Chest x-ray today.  Prednisone  taper and azithromycin  today.  Suspect acute bronchitis.  If not improving in 3 days would recommend ED urgent care evaluation.  Do worry about underlying cardiac contribution.  Chronic Cough: Likely multifactorial. Improved with Dulera in past  although more recently Breo did not help. Did not improve with high-dose Dulera or low-dose gabapentin .  Has improved historically with prolonged steroid therapy.  Worse after RSV infection.  Trial nebulized budesonide  at night given nocturnal cough major issue, with mild improvement.  Daily dexamethasone  used helpful.  Breakthrough GERD symptoms likely driving nocturnal cough.  Encourage discussed with PCP and GI team.  Try dextromethorphan -promethazine  at night only as needed to see if it helps with nocturnal cough.  Pulmonary AVMs: Repeat CT June 2025  showed stable AVMs.  ILD: Mild on imaging.  Relatively stable.  Given mild fibrosis discussed risk and benefits of antifibrotic's.  Unfortunately cost prohibitive.    Return in about 3 months (around 01/03/2025) for f/u Dr. Annella.   Donnice JONELLE Annella, MD 10/03/2024

## 2024-10-03 NOTE — Telephone Encounter (Signed)
 FYI Only or Action Required?: Action required by provider: request for appointment.  Patient was last seen in primary care on 07/25/2024 by Early, Camie BRAVO, NP.  Called Nurse Triage reporting Cough.  Symptoms began a week ago.  Interventions attempted: Rest, hydration, or home remedies.  Symptoms are: gradually worsening.  Triage Disposition: See HCP Within 4 Hours (Or PCP Triage)  Patient/caregiver understands and will follow disposition?: Yes    Copied from CRM [id number]. Topic: Clinical - Red Word Triage >> Oct 03, 2024  9:27 AM Russell PARAS wrote: Red Word that prompted transfer to Nurse Triage:   Pt has had cough for 1 week, does have history of chronic cough but it has worsened Dry cough Some SOB Feels may be moving into his lungs  Pt of [place] Answer Assessment - Initial Assessment Questions 1. ONSET: When did the cough begin?      1 week 2. SEVERITY: How bad is the cough today?      severe 3. SPUTUM: Describe the color of your sputum (e.g., none, dry cough; clear, white, yellow, green)     Dry cough 4. HEMOPTYSIS: Are you coughing up any blood? If Yes, ask: How much? (e.g., flecks, streaks, tablespoons, etc.)     no 5. DIFFICULTY BREATHING: Are you having difficulty breathing? If Yes, ask: How bad is it? (e.g., mild, moderate, severe)      Deep breath 6. FEVER: Do you have a fever? If Yes, ask: What is your temperature, how was it measured, and when did it start?     no 7. CARDIAC HISTORY: Do you have any history of heart disease? (e.g., heart attack, congestive heart failure)      no 8. LUNG HISTORY: Do you have any history of lung disease?  (e.g., pulmonary embolus, asthma, emphysema)     yes 9. PE RISK FACTORS: Do you have a history of blood clots? (or: recent major surgery, recent prolonged travel, bedridden)     no 10. OTHER SYMPTOMS: Do you have any other symptoms? (e.g., runny nose, wheezing, chest pain)       no 11. PREGNANCY: Is  there any chance you are pregnant? When was your last menstrual period?       N/a 12. TRAVEL: Have you traveled out of the country in the last month? (e.g., travel history, exposures)       no  Protocols used: Cough - Acute Non-Productive-A-AH  Reason for Disposition  [1] MILD difficulty breathing (e.g., minimal/no SOB at rest, SOB with walking, pulse < 100) AND [2] still present when not coughing  Answer Assessment - Initial Assessment Questions 1. ONSET: When did the cough begin?      1 week 2. SEVERITY: How bad is the cough today?      severe 3. SPUTUM: Describe the color of your sputum (e.g., none, dry cough; clear, white, yellow, green)     Dry cough 4. HEMOPTYSIS: Are you coughing up any blood? If Yes, ask: How much? (e.g., flecks, streaks, tablespoons, etc.)     no 5. DIFFICULTY BREATHING: Are you having difficulty breathing? If Yes, ask: How bad is it? (e.g., mild, moderate, severe)      Deep breath 6. FEVER: Do you have a fever? If Yes, ask: What is your temperature, how was it measured, and when did it start?     no 7. CARDIAC HISTORY: Do you have any history of heart disease? (e.g., heart attack, congestive heart failure)      no 8.  LUNG HISTORY: Do you have any history of lung disease?  (e.g., pulmonary embolus, asthma, emphysema)     yes 9. PE RISK FACTORS: Do you have a history of blood clots? (or: recent major surgery, recent prolonged travel, bedridden)     no 10. OTHER SYMPTOMS: Do you have any other symptoms? (e.g., runny nose, wheezing, chest pain)       no 11. PREGNANCY: Is there any chance you are pregnant? When was your last menstrual period?       N/a 12. TRAVEL: Have you traveled out of the country in the last month? (e.g., travel history, exposures)       no  Protocols used: Cough - Acute Non-Productive-A-AH

## 2024-10-03 NOTE — Patient Instructions (Addendum)
 Nice to see you today  Avoid about bronchitis with the chest discomfort and cough  Take azithromycin  and prednisone  as prescribed  Chest x-ray today  If you are not getting better by the weekend I recommend being evaluated in the emergency room, I would be worried about the heart or another pulmonary issue that we have not been able to solve with our initial treatment.  Return to clinic in 3 months or sooner as needed with Dr. Annella

## 2024-10-04 ENCOUNTER — Other Ambulatory Visit

## 2024-10-04 DIAGNOSIS — E785 Hyperlipidemia, unspecified: Secondary | ICD-10-CM

## 2024-10-04 DIAGNOSIS — E7800 Pure hypercholesterolemia, unspecified: Secondary | ICD-10-CM

## 2024-10-04 DIAGNOSIS — I5032 Chronic diastolic (congestive) heart failure: Secondary | ICD-10-CM

## 2024-10-05 LAB — LIPID PANEL
Chol/HDL Ratio: 3.7 ratio (ref 0.0–5.0)
Cholesterol, Total: 128 mg/dL (ref 100–199)
HDL: 35 mg/dL — ABNORMAL LOW (ref >39)
LDL Chol Calc (NIH): 79 mg/dL (ref 0–99)
Triglycerides: 69 mg/dL (ref 0–149)
VLDL Cholesterol Cal: 14 mg/dL (ref 5–40)

## 2024-10-08 ENCOUNTER — Ambulatory Visit: Payer: Self-pay | Admitting: Nurse Practitioner

## 2024-11-21 ENCOUNTER — Other Ambulatory Visit: Payer: Self-pay | Admitting: Nurse Practitioner

## 2024-11-21 DIAGNOSIS — N401 Enlarged prostate with lower urinary tract symptoms: Secondary | ICD-10-CM

## 2024-11-27 ENCOUNTER — Inpatient Hospital Stay: Attending: Oncology

## 2024-11-27 ENCOUNTER — Ambulatory Visit: Admitting: Oncology

## 2024-11-27 DIAGNOSIS — D500 Iron deficiency anemia secondary to blood loss (chronic): Secondary | ICD-10-CM | POA: Diagnosis present

## 2024-11-27 LAB — CBC WITH DIFFERENTIAL (CANCER CENTER ONLY)
Abs Immature Granulocytes: 0.01 K/uL (ref 0.00–0.07)
Basophils Absolute: 0 K/uL (ref 0.0–0.1)
Basophils Relative: 1 %
Eosinophils Absolute: 0.1 K/uL (ref 0.0–0.5)
Eosinophils Relative: 1 %
HCT: 41.6 % (ref 39.0–52.0)
Hemoglobin: 14.2 g/dL (ref 13.0–17.0)
Immature Granulocytes: 0 %
Lymphocytes Relative: 36 %
Lymphs Abs: 2.3 K/uL (ref 0.7–4.0)
MCH: 30.6 pg (ref 26.0–34.0)
MCHC: 34.1 g/dL (ref 30.0–36.0)
MCV: 89.7 fL (ref 80.0–100.0)
Monocytes Absolute: 0.6 K/uL (ref 0.1–1.0)
Monocytes Relative: 9 %
Neutro Abs: 3.3 K/uL (ref 1.7–7.7)
Neutrophils Relative %: 53 %
Platelet Count: 257 K/uL (ref 150–400)
RBC: 4.64 MIL/uL (ref 4.22–5.81)
RDW: 13.9 % (ref 11.5–15.5)
WBC Count: 6.2 K/uL (ref 4.0–10.5)
nRBC: 0 % (ref 0.0–0.2)

## 2024-11-27 LAB — FERRITIN: Ferritin: 55 ng/mL (ref 24–336)

## 2024-12-06 ENCOUNTER — Other Ambulatory Visit: Payer: Self-pay | Admitting: Nurse Practitioner

## 2024-12-06 DIAGNOSIS — J069 Acute upper respiratory infection, unspecified: Secondary | ICD-10-CM

## 2024-12-06 MED ORDER — HYDROCODONE BIT-HOMATROP MBR 5-1.5 MG/5ML PO SOLN: 5.0000 mL | Freq: Every evening | ORAL | 0 refills | Status: AC | PRN

## 2024-12-06 MED ORDER — PREDNISONE 20 MG PO TABS: 40.0000 mg | ORAL_TABLET | Freq: Every day | ORAL | 0 refills | Status: DC

## 2024-12-06 MED ORDER — AZITHROMYCIN 250 MG PO TABS: ORAL_TABLET | ORAL | 0 refills | Status: AC

## 2024-12-30 ENCOUNTER — Other Ambulatory Visit: Payer: Self-pay | Admitting: Nurse Practitioner

## 2024-12-30 DIAGNOSIS — R053 Chronic cough: Secondary | ICD-10-CM

## 2024-12-30 DIAGNOSIS — E039 Hypothyroidism, unspecified: Secondary | ICD-10-CM

## 2025-01-04 ENCOUNTER — Other Ambulatory Visit: Payer: Self-pay | Admitting: Nurse Practitioner

## 2025-01-04 DIAGNOSIS — J392 Other diseases of pharynx: Secondary | ICD-10-CM

## 2025-01-04 MED ORDER — NYSTATIN 100000 UNIT/ML MT SUSP: 5.0000 mL | Freq: Three times a day (TID) | OROMUCOSAL | 3 refills | Status: AC | PRN

## 2025-01-07 ENCOUNTER — Ambulatory Visit: Admitting: Pulmonary Disease

## 2025-01-07 ENCOUNTER — Encounter: Payer: Self-pay | Admitting: Pulmonary Disease

## 2025-01-07 VITALS — BP 130/69 | HR 66 | Ht 69.0 in | Wt 184.0 lb

## 2025-01-07 DIAGNOSIS — R053 Chronic cough: Secondary | ICD-10-CM

## 2025-01-07 DIAGNOSIS — J849 Interstitial pulmonary disease, unspecified: Secondary | ICD-10-CM | POA: Diagnosis not present

## 2025-01-07 DIAGNOSIS — Q2572 Congenital pulmonary arteriovenous malformation: Secondary | ICD-10-CM | POA: Diagnosis not present

## 2025-01-07 DIAGNOSIS — K219 Gastro-esophageal reflux disease without esophagitis: Secondary | ICD-10-CM

## 2025-01-07 DIAGNOSIS — J301 Allergic rhinitis due to pollen: Secondary | ICD-10-CM

## 2025-01-07 NOTE — Patient Instructions (Addendum)
 Nice to see you again  Sounds like cough is doing okay, no changes to medicine  I recommend getting a repeat RSV vaccine Apr 19, 2025, this is 2 years after your initial 1 in 2024.  Return to clinic in 6 months or sooner as needed with Dr. Annella

## 2025-01-07 NOTE — Progress Notes (Signed)
 "  @Patient  ID: Gregory [name], male    DOB: 1941/05/11, 84 y.o.   MRN: [id number]  Chief Complaint  Patient presents with   Medical Management of Chronic Issues    Pt states CC - never stops     Referring provider: Early, Camie BRAVO, NP  HPI:   84 y.o. 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  Most recent PCP note reviewed.  Most recent cardiology note reviewed.  Overall doing well.  Symptom burden of cough not too bothersome currently.  He is using dextromethorphan  and Robitussin regularly.  Not using dextromethorphan  cough syrup.  He does have a small supply of Hycodan cough syrup he uses at night only.  But this is rare.  He is using budesonide  nebulized at night as well intermittently.  Historically cough congestion bronchitis reliably improved with prednisone .  However, maintenance inhalers have done little for his chronic cough.  He has no concerns today.  We discussed repeat RSV vaccine at 2-year interval, early May 2026 as he had his initial vaccine April 19, 2023.  HPI at initial visit: Patient has chronic cough over the last 2 years.  When asked in more detail he admits cough is been present for well over a decade.  Unclear how much change there has been since last seen by pulmonary some years ago.  Does seem that the frequency has worsened over the last couple of years.  Worse at night but occurs throughout the day.  It is nonproductive.  Uses a variety of dextromethorphan  containing products that do help, he rotates through these 2 or 3 products as they seem to wear off and affect.  He is currently on Breo does not seem to help.  Recently resumed gabapentin  at low-dose, 100 mg twice daily with mild improvement.  Notes in the past he was on 200 mg twice daily of gabapentin  with significant improvement however he had syncope in the setting of what sounds like polypharmacy.  Prior pulmonary notes indicate that he is cough  improved on Dulera.  He identifies no other alleviating or exacerbating factors.  No environmental or seasonal factors that contribute to better or worse cough.  No position where things are better or worse.  He is on a PPI twice daily for a long history of GERD.  Does endorse occasional breakthrough symptoms of heartburn, reflux.  Reports endoscopy, manometry, pH probe in the past although these results are all available for review and he does not recall the results.  Reviewed serial CT scans dating back to 2016, with persistent right lower lobe large AVM and small left lower lobe AVM stable from scan 04/2015, 09/2018, 11/2018, 11/2020 with otherwise clear lungs on my interpretation.  PMH: HHT with pulmonary AVMs, hypertension, GERD Surgical history: Brain surgery for brain abscess, Family history: Mother with diabetes, hypertension, father with kidney failure Social history: Lives in [place], retired, went to [employer], has 3 grandchildren  Public House Manager / Pulmonary Flowsheets:   ACT:      No data to display          MMRC:     No data to display          Epworth:      No data to display          Tests:   FENO:  No results found for: NITRICOXIDE  PFT:    Latest Ref Rng & Units 07/31/2021  10:49 AM  PFT Results  FVC-Pre L 2.90   FVC-Predicted Pre % 72   FVC-Post L 2.81   FVC-Predicted Post % 69   Pre FEV1/FVC % % 81   Post FEV1/FCV % % 84   FEV1-Pre L 2.34   FEV1-Predicted Pre % 81   FEV1-Post L 2.38   DLCO uncorrected ml/min/mmHg 10.47   DLCO UNC% % 43   DLCO corrected ml/min/mmHg 10.47   DLCO COR %Predicted % 43   DLVA Predicted % 57   TLC L 5.07   TLC % Predicted % 72   RV % Predicted % 85   Personally reviewed and interpreted as normal spirometry, mildly reduced TLC/mild restriction, severely reduced DLCO.  WALK:     04/18/2023    2:16 PM 01/20/2023   11:45 AM 01/06/2023   11:53 AM  SIX MIN WALK  Supplimental Oxygen during Test? (L/min) No Yes  Yes  O2 Flow Rate  3 L/min 5 L/min  Type  Continuous Continuous  Tech Comments: patient ambulated on RA at a moderate, steady pace with no stops, no complaints of sob. Patient O2 sats monitored on head probe during walk.  rest and lap #1 were on room air, provided 5L O2 and sats went up   Imaging: Personally reviewed and as per EMR  Lab Results: Personally reviewed CBC    Component Value Date/Time   WBC 6.2 11/27/2024 1011   WBC 7.6 05/31/2024 1745   RBC 4.64 11/27/2024 1011   HGB 14.2 11/27/2024 1011   HGB 14.8 01/27/2024 0848   HCT 41.6 11/27/2024 1011   HCT 44.6 01/27/2024 0848   PLT 257 11/27/2024 1011   PLT 301 01/27/2024 0848   MCV 89.7 11/27/2024 1011   MCV 93 01/27/2024 0848   MCH 30.6 11/27/2024 1011   MCHC 34.1 11/27/2024 1011   RDW 13.9 11/27/2024 1011   RDW 13.0 01/27/2024 0848   LYMPHSABS 2.3 11/27/2024 1011   LYMPHSABS 2.1 01/27/2024 0848   MONOABS 0.6 11/27/2024 1011   EOSABS 0.1 11/27/2024 1011   EOSABS 0.1 01/27/2024 0848   BASOSABS 0.0 11/27/2024 1011   BASOSABS 0.1 01/27/2024 0848    BMET    Component Value Date/Time   NA 132 (L) 05/31/2024 1745   NA 134 01/27/2024 0848   K 4.3 05/31/2024 1745   CL 94 (L) 05/31/2024 1745   CO2 24 05/31/2024 1745   GLUCOSE 87 05/31/2024 1745   BUN 11 05/31/2024 1745   BUN 9 01/27/2024 0848   CREATININE 0.88 05/31/2024 1745   CALCIUM  9.4 05/31/2024 1745   GFRNONAA >60 05/31/2024 1745   GFRAA >60 06/01/2020 1106    BNP    Component Value Date/Time   BNP 37.1 12/30/2022 0548    ProBNP    Component Value Date/Time   PROBNP 126.0 (H) 03/19/2021 0959    Specialty Problems       Pulmonary Problems   Seasonal allergic rhinitis due to pollen   Laryngopharyngeal reflux (LPR)   Interstitial lung disease (HCC)   Influenza A    Allergies  Allergen Reactions   Nsaids Other (See Comments)    Cannot take-per MD   Other Other (See Comments)    All blood thinners-cannot take per MD   Aspirin Other (See  Comments)    nosebleeds   Statins Other (See Comments)    Weakness, myalgias   Demeclocycline Other (See Comments)    Passed out   Dust Mite Extract Other (See Comments)   Tizanidine  Hcl Other (See Comments)    Patient stated that after taking this medication he experienced feelings of dizziness and disorientation.     Immunization History  Administered Date(s) Administered   Fluad  Quad(high Dose 65+) 08/24/2019, 10/02/2020, 09/11/2021   Fluad  Trivalent(High Dose 65+) 09/13/2023   INFLUENZA, HIGH DOSE SEASONAL PF 09/09/2016, 09/16/2017, 10/03/2018, 09/11/2024   Influenza Split 09/21/2011, 09/27/2012   Influenza Whole 09/11/2008, 09/11/2009, 09/22/2010   Influenza, Quadrivalent, Recombinant, Inj, Pf 08/20/2022   Influenza,inj,Quad PF,6+ Mos 10/04/2013, 10/08/2014, 10/02/2015   PFIZER Comirnaty (Gray Top)Covid-19 Tri-Sucrose Vaccine 06/08/2021   PFIZER(Purple Top)SARS-COV-2 Vaccination 01/03/2020, 01/23/2020, 10/18/2020   PNEUMOCOCCAL CONJUGATE-20 04/26/2022   Pfizer Covid-19 Vaccine Bivalent Booster 68yrs & up 10/21/2021   Pfizer(Comirnaty )Fall Seasonal Vaccine 12 years and older 10/06/2022, 08/30/2023, 10/02/2024   Pneumococcal Conjugate-13 12/07/2016   Pneumococcal Polysaccharide-23 03/12/2010, 06/27/2018   Respiratory Syncytial Virus Vaccine ,Recomb Aduvanted(Arexvy ) 04/19/2023   Tdap 10/04/2013, 10/06/2023   Varicella 12/20/2009   Zoster Recombinant(Shingrix ) 06/24/2022, 10/25/2022   Zoster, Live 09/06/2022    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   Encounter for examination following a fall 10/06/2023   Essential  hypertension 12/27/2022   GERD (gastroesophageal reflux disease)    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  WITHOUT HEMOPTYSIS   History of nonmelanoma skin cancer EXCISION SQUAMOUS CELL FROM HAND   Hospital discharge follow-up 01/18/2023   Hypertrophy of prostate with urinary obstruction and other lower urinary tract symptoms (LUTS)    ILD (interstitial lung disease) (HCC)    Iron  deficiency anemia due to chronic blood loss 10/08/2014   Irritable bowel syndrome    Laceration of arm, right, multiple sites, initial encounter 10/06/2023   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   Nocturnal leg cramps 06/11/2013   On home O2 01/18/2023   Pain in both lower extremities 10/28/2022   Plantar fascial fibromatosis    Pressure ulcer caused by device 01/07/2023   Pure hypercholesterolemia    RSV (respiratory syncytial virus pneumonia) 01/07/2023   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  Added automatically from request for surgery [id number]   Vaccine counseling 01/24/2023    Tobacco History: Social History   Tobacco Use  Smoking Status Never  Smokeless Tobacco Never   Counseling given: Not Answered   Continue to not smoke  Outpatient Encounter Medications as of 01/07/2025  Medication Sig   acetaminophen  (TYLENOL ) 325 MG tablet Take 2 tablets (650 mg total) by mouth every 6 (six) hours as needed for mild pain (or Fever >/= 101).   Ascorbic Acid  (VITAMIN C ) 1000 MG tablet Take 500 mg by mouth daily.   budesonide  (PULMICORT ) 0.5 MG/2ML nebulizer solution  TAKE 2 MLS (0.5 MG TOTAL) BY NEBULIZATION AT BEDTIME.   Cholecalciferol  125 MCG (5000 UT)  capsule Take 5,000 Units by mouth daily.   ciclopirox  (PENLAC ) 8 % solution Apply topically at bedtime. Apply over nail and surrounding skin. Apply daily over previous coat. After seven (7) days, may remove with alcohol and continue cycle.   docusate sodium  (COLACE) 100 MG capsule Take 200 mg by mouth at bedtime.   gabapentin  (NEURONTIN ) 100 MG capsule TAKE 1 CAPSULE BY MOUTH TWICE  DAILY   HYDROcodone  bit-homatropine (HYCODAN) 5-1.5 MG/5ML syrup Take 5 mLs by mouth at bedtime as needed for cough.   Iron , Ferrous Sulfate , 325 (65 Fe) MG TABS Take 325 mg by mouth in the morning, at noon, and at bedtime.   levocetirizine (XYZAL ) 5 MG tablet Take 1 tablet (5 mg total) by mouth every evening.   levothyroxine  (SYNTHROID ) 150 MCG tablet TAKE 1 TABLET BY MOUTH DAILY  BEFORE BREAKFAST   magic mouthwash (nystatin , hydrocortisone , diphenhydrAMINE) suspension Swish and swallow 5 mLs 3 (three) times daily as needed (mouth and throat irritation).   Magnesium  250 MG TABS Take 250 mg by mouth at bedtime.    meclizine  (ANTIVERT ) 25 MG tablet TAKE 1 TABLET (25 MG TOTAL) BY MOUTH 2 (TWO) TIMES DAILY AS NEEDED FOR DIZZINESS.   methocarbamol  (ROBAXIN ) 500 MG tablet Take 1 tablet (500 mg total) by mouth every 8 (eight) hours as needed for muscle spasms.   montelukast  (SINGULAIR ) 10 MG tablet TAKE 1 TABLET BY MOUTH AT  BEDTIME   Multiple Vitamin (MULTIVITAMIN WITH MINERALS) TABS tablet Take 1 tablet by mouth every morning.   pantoprazole  (PROTONIX ) 40 MG tablet TAKE 1 TABLET BY MOUTH TWICE  DAILY   silodosin  (RAPAFLO ) 8 MG CAPS capsule TAKE 1 CAPSULE BY MOUTH AT BEDTIME   sodium chloride  1 g tablet Take 1 tablet (1 g total) by mouth 2 (two) times daily with a meal.   [DISCONTINUED] amoxicillin  (AMOXIL ) 500 MG capsule SMARTSIG:4 Capsule(s) By Mouth PRN for dental visits   [DISCONTINUED] predniSONE  (DELTASONE ) 20 MG tablet Take 2  tablets (40 mg total) by mouth daily with breakfast.   [DISCONTINUED] promethazine -dextromethorphan  (PROMETHAZINE -DM) 6.25-15 MG/5ML syrup Take 5 mLs by mouth at bedtime as needed for cough.   [DISCONTINUED] dextromethorphan  15 MG/5ML syrup Take 15 mg by mouth in the morning and at bedtime.   No facility-administered encounter medications on file as of 01/07/2025.     Review of Systems  Review of Systems  N/a Physical Exam  BP 130/69   Pulse 66   Ht 5' 9 (1.753 m) Comment: per pt  Wt 184 lb (83.5 kg)   SpO2 90%   BMI 27.17 kg/m   Wt Readings from Last 5 Encounters:  01/07/25 184 lb (83.5 kg)  10/03/24 182 lb (82.6 kg)  10/01/24 178 lb 9.6 oz (81 kg)  08/07/24 179 lb (81.2 kg)  07/25/24 179 lb 12.8 oz (81.6 kg)    BMI Readings from Last 5 Encounters:  01/07/25 27.17 kg/m  10/03/24 26.88 kg/m  10/01/24 26.37 kg/m  08/07/24 26.43 kg/m  07/25/24 26.55 kg/m     Physical Exam General: Sitting up in chair, no distress Eyes: No icterus Neck: No JVP sitting upright Cardiovascular: Warm, no edema noted Pulmonary: Clear bilaterally, normal work of breathing, good air excursion Abdomen: Not distended   Assessment & Plan:   Chronic Cough: Likely multifactorial. Improved with Dulera in past although more recently Breo did not help.  Subsequently, has not improve with high-dose Dulera or low-dose gabapentin .  Has improved historically with prolonged steroid therapy.  Worse after RSV infection.  Mild improvement with nebulized budesonide  at night, encouraged him to continue.  Suspicion that GERD symptoms likely driving nocturnal cough.  Overall he is tolerating cough, acceptable burden of symptoms with regular use of dextromethorphan .  Discussed referral for clinical trial regarding chronic cough which he declines today.  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  Consider repeat as needed based on symptoms or at 2-year interval June 2027.  ILD: Mild on  imaging.  Relatively stable.  Given mild fibrosis discussed risk and benefits of antifibrotic's.  Unfortunately cost prohibitive.    Return in about 6 months (around 07/07/2025) for f/u Dr. Annella.   Donnice JONELLE Annella, MD 01/07/2025     "

## 2025-01-10 ENCOUNTER — Telehealth: Payer: Self-pay

## 2025-01-10 NOTE — Telephone Encounter (Signed)
 Copied from CRM #[id number]. Topic: Clinical - Medical Advice >> Jan 10, 2025 10:04 AM Rilla NOVAK wrote: Reason for CRM: Patient states when he last saw Dr Annella they discussed him going on oxygen and he did not think he needed it at that time.  Patient states since then, he has had low readings and thinks he may need it. Please call patient @ [phone] to discuss.    Spoke with patient he is scheduled for O2 walk next month    -NFN

## 2025-01-29 ENCOUNTER — Encounter: Payer: Self-pay | Admitting: Nurse Practitioner

## 2025-02-11 ENCOUNTER — Ambulatory Visit: Admitting: Primary Care

## 2025-03-05 ENCOUNTER — Ambulatory Visit

## 2025-03-12 ENCOUNTER — Ambulatory Visit: Payer: Self-pay

## 2025-03-28 ENCOUNTER — Other Ambulatory Visit

## 2025-03-28 ENCOUNTER — Ambulatory Visit: Admitting: Oncology

## 2025-07-23 ENCOUNTER — Ambulatory Visit: Admitting: Neurology
